# Patient Record
Sex: Female | Born: 1945 | Race: White | Hispanic: No | Smoking: Never smoker
Health system: Southern US, Community
[De-identification: ages and names within clinical notes are randomized; demographics above are authoritative.]

## PROBLEM LIST (undated history)

## (undated) DIAGNOSIS — M069 Rheumatoid arthritis, unspecified: Secondary | ICD-10-CM

## (undated) DIAGNOSIS — K5792 Diverticulitis of intestine, part unspecified, without perforation or abscess without bleeding: Secondary | ICD-10-CM

## (undated) DIAGNOSIS — F329 Major depressive disorder, single episode, unspecified: Secondary | ICD-10-CM

## (undated) DIAGNOSIS — C439 Malignant melanoma of skin, unspecified: Secondary | ICD-10-CM

## (undated) DIAGNOSIS — F419 Anxiety disorder, unspecified: Secondary | ICD-10-CM

## (undated) DIAGNOSIS — K279 Peptic ulcer, site unspecified, unspecified as acute or chronic, without hemorrhage or perforation: Secondary | ICD-10-CM

## (undated) DIAGNOSIS — F32A Depression, unspecified: Secondary | ICD-10-CM

## (undated) DIAGNOSIS — D649 Anemia, unspecified: Secondary | ICD-10-CM

## (undated) DIAGNOSIS — E039 Hypothyroidism, unspecified: Secondary | ICD-10-CM

## (undated) DIAGNOSIS — I509 Heart failure, unspecified: Secondary | ICD-10-CM

## (undated) DIAGNOSIS — Z5189 Encounter for other specified aftercare: Secondary | ICD-10-CM

## (undated) DIAGNOSIS — I10 Essential (primary) hypertension: Secondary | ICD-10-CM

## (undated) DIAGNOSIS — I350 Nonrheumatic aortic (valve) stenosis: Secondary | ICD-10-CM

## (undated) DIAGNOSIS — M1990 Unspecified osteoarthritis, unspecified site: Secondary | ICD-10-CM

## (undated) DIAGNOSIS — E785 Hyperlipidemia, unspecified: Secondary | ICD-10-CM

## (undated) DIAGNOSIS — Z952 Presence of prosthetic heart valve: Secondary | ICD-10-CM

## (undated) DIAGNOSIS — N183 Chronic kidney disease, stage 3 (moderate): Secondary | ICD-10-CM

## (undated) DIAGNOSIS — J40 Bronchitis, not specified as acute or chronic: Secondary | ICD-10-CM

## (undated) DIAGNOSIS — H3530 Unspecified macular degeneration: Secondary | ICD-10-CM

## (undated) HISTORY — DX: Unspecified macular degeneration: H35.30

## (undated) HISTORY — PX: APPENDECTOMY: SHX54

## (undated) HISTORY — PX: YAG LASER APPLICATION: SHX6189

## (undated) HISTORY — PX: BREAST SURGERY: SHX581

## (undated) HISTORY — PX: CARDIAC CATHETERIZATION: SHX172

## (undated) HISTORY — PX: CATARACT EXTRACTION: SUR2

## (undated) HISTORY — PX: OTHER SURGICAL HISTORY: SHX169

## (undated) HISTORY — DX: Malignant melanoma of skin, unspecified: C43.9

## (undated) HISTORY — PX: EYE SURGERY: SHX253

---

## 1972-12-14 HISTORY — PX: CHOLECYSTECTOMY: SHX55

## 1986-12-14 HISTORY — PX: ABDOMINAL HYSTERECTOMY: SHX81

## 1992-12-14 HISTORY — PX: FOOT SURGERY: SHX648

## 2001-05-06 ENCOUNTER — Ambulatory Visit (HOSPITAL_COMMUNITY): Admission: RE | Admit: 2001-05-06 | Discharge: 2001-05-06 | Payer: Self-pay | Admitting: *Deleted

## 2001-05-06 ENCOUNTER — Other Ambulatory Visit: Admission: RE | Admit: 2001-05-06 | Discharge: 2001-05-06 | Payer: Self-pay | Admitting: *Deleted

## 2001-05-06 ENCOUNTER — Encounter: Payer: Self-pay | Admitting: *Deleted

## 2001-07-22 ENCOUNTER — Ambulatory Visit (HOSPITAL_COMMUNITY): Admission: RE | Admit: 2001-07-22 | Discharge: 2001-07-22 | Payer: Self-pay | Admitting: Internal Medicine

## 2001-07-22 ENCOUNTER — Encounter: Payer: Self-pay | Admitting: Internal Medicine

## 2002-10-02 ENCOUNTER — Ambulatory Visit (HOSPITAL_COMMUNITY): Admission: RE | Admit: 2002-10-02 | Discharge: 2002-10-02 | Payer: Self-pay | Admitting: Family Medicine

## 2002-10-02 ENCOUNTER — Encounter: Payer: Self-pay | Admitting: Family Medicine

## 2002-10-09 ENCOUNTER — Ambulatory Visit (HOSPITAL_COMMUNITY): Admission: RE | Admit: 2002-10-09 | Discharge: 2002-10-09 | Payer: Self-pay | Admitting: Family Medicine

## 2002-10-09 ENCOUNTER — Encounter: Payer: Self-pay | Admitting: Family Medicine

## 2003-06-15 ENCOUNTER — Ambulatory Visit (HOSPITAL_COMMUNITY): Admission: RE | Admit: 2003-06-15 | Discharge: 2003-06-15 | Payer: Self-pay | Admitting: Thoracic Surgery

## 2003-06-15 ENCOUNTER — Encounter: Payer: Self-pay | Admitting: Thoracic Surgery

## 2003-06-21 ENCOUNTER — Ambulatory Visit (HOSPITAL_COMMUNITY): Admission: RE | Admit: 2003-06-21 | Discharge: 2003-06-21 | Payer: Self-pay | Admitting: Thoracic Surgery

## 2003-06-21 ENCOUNTER — Encounter: Payer: Self-pay | Admitting: Thoracic Surgery

## 2003-07-12 ENCOUNTER — Ambulatory Visit (HOSPITAL_COMMUNITY): Admission: RE | Admit: 2003-07-12 | Discharge: 2003-07-12 | Payer: Self-pay | Admitting: Thoracic Surgery

## 2003-07-12 ENCOUNTER — Encounter: Payer: Self-pay | Admitting: Thoracic Surgery

## 2004-02-06 ENCOUNTER — Ambulatory Visit (HOSPITAL_COMMUNITY): Admission: RE | Admit: 2004-02-06 | Discharge: 2004-02-06 | Payer: Self-pay | Admitting: Family Medicine

## 2004-02-21 ENCOUNTER — Ambulatory Visit (HOSPITAL_COMMUNITY): Admission: RE | Admit: 2004-02-21 | Discharge: 2004-02-21 | Payer: Self-pay | Admitting: General Surgery

## 2006-06-28 ENCOUNTER — Ambulatory Visit (HOSPITAL_COMMUNITY): Admission: RE | Admit: 2006-06-28 | Discharge: 2006-06-28 | Payer: Self-pay | Admitting: Family Medicine

## 2007-06-06 ENCOUNTER — Encounter: Payer: Self-pay | Admitting: Internal Medicine

## 2007-06-06 ENCOUNTER — Ambulatory Visit: Payer: Self-pay | Admitting: Internal Medicine

## 2007-06-06 ENCOUNTER — Ambulatory Visit (HOSPITAL_COMMUNITY): Admission: RE | Admit: 2007-06-06 | Discharge: 2007-06-06 | Payer: Self-pay | Admitting: Internal Medicine

## 2007-06-06 HISTORY — PX: COLONOSCOPY: SHX174

## 2007-10-31 ENCOUNTER — Ambulatory Visit (HOSPITAL_COMMUNITY): Admission: RE | Admit: 2007-10-31 | Discharge: 2007-10-31 | Payer: Self-pay | Admitting: Family Medicine

## 2008-04-03 ENCOUNTER — Ambulatory Visit (HOSPITAL_COMMUNITY): Admission: RE | Admit: 2008-04-03 | Discharge: 2008-04-03 | Payer: Self-pay | Admitting: Family Medicine

## 2008-07-14 HISTORY — PX: BACK SURGERY: SHX140

## 2008-08-10 ENCOUNTER — Inpatient Hospital Stay (HOSPITAL_COMMUNITY): Admission: RE | Admit: 2008-08-10 | Discharge: 2008-08-14 | Payer: Self-pay | Admitting: Neurosurgery

## 2008-12-14 HISTORY — PX: MELANOMA EXCISION: SHX5266

## 2009-11-20 ENCOUNTER — Encounter: Payer: Self-pay | Admitting: Family Medicine

## 2009-11-20 ENCOUNTER — Ambulatory Visit (HOSPITAL_COMMUNITY): Admission: RE | Admit: 2009-11-20 | Discharge: 2009-11-20 | Payer: Self-pay | Admitting: Family Medicine

## 2011-01-03 ENCOUNTER — Encounter: Payer: Self-pay | Admitting: Family Medicine

## 2011-01-04 ENCOUNTER — Encounter: Payer: Self-pay | Admitting: Internal Medicine

## 2011-04-28 NOTE — Discharge Summary (Signed)
NAME:  [name] [name], [name] [name]             ACCOUNT NO.:  [id number]   MEDICAL RECORD NO.:  [id number]          PATIENT TYPE:  INP   LOCATION:  3006                         FACILITY:  [facility]   PHYSICIAN:  Leeroy Cha, M.D.   DATE OF BIRTH:  05-27-1946   DATE OF ADMISSION:  08/10/2008  DATE OF DISCHARGE:  08/14/2008                               DISCHARGE SUMMARY   ADMISSION DIAGNOSIS:  L5-S1 grade 2 spondylolisthesis.   FINAL DIAGNOSIS:  L5-S1 grade 2 spondylolisthesis.   CLINICAL HISTORY:  The patient was admitted because of back pain  radiating to both legs.  X-ray showed grade 2 spondylolisthesis.  Surgery was advised.  The patient has a history of diabetes and thyroid  disease.  Laboratory normal.   COURSE IN THE HOSPITAL:  The patient was taken to Surgery and L5-S1  diskectomy plus fusion was done.  Today, she is much better.  She is  ambulating and she feels that she is ready to go home.   CONDITION ON DISCHARGE:  Improved.   MEDICATIONS:  Darvocet and diazepam.   DIET:  She will continue her diabetic diet.   FOLLOWUP:  She will be seen by me in my office in 4 weeks.           ______________________________  Leeroy Cha, M.D.     EB/MEDQ  D:  08/14/2008  T:  08/14/2008  Job:  [id number]

## 2011-04-28 NOTE — Op Note (Signed)
NAME:  [name] [name], BANDT             ACCOUNT NO.:  [id number]   MEDICAL RECORD NO.:  [id number]          PATIENT TYPE:  AMB   LOCATION:  DAY                           FACILITY:  [facility]   PHYSICIAN:  R. Garfield Cornea, M.D. DATE OF BIRTH:  [birth year]   DATE OF PROCEDURE:  06/06/2007  DATE OF DISCHARGE:                               OPERATIVE REPORT   PROCEDURE:  Colonoscopy with biopsy.   INDICATIONS FOR PROCEDURE:  A 65 year old Caucasian female with no lower  GI tract symptoms comes for colonoscopy referred by Dr. Caron Presume.  The  family history positive for colon cancer in her brother.  Her last  colonoscopy was in 1999; and she was found to have some internal  hemorrhoids and left-sided diverticula.  Colonoscopy is now being done,  largely, as a screening maneuver.  This approach has discussed with the  patient at length.  Potential risks, benefits, and alternatives have  been reviewed, questions answered; she is agreeable.  Please see the  documentation in the medical record.   PROCEDURE NOTE:  O2 saturation, blood pressure, pulse, and respirations  monitored the entire procedure.   CONSCIOUS SEDATION:  Demerol 50 mg IV, Versed 3 mg IV in divided doses.   INSTRUMENT:  Pentax video chip system.   FINDINGS:  Digital rectal exam revealed no abnormalities.   ENDOSCOPIC FINDINGS:  The prep was adequate.   COLON:  The colonic mucosa was surveyed from the rectosigmoid junction  through the left transverse, right colon, to the appendiceal orifice,  ileocecal valve, and cecum.  These structures were well seen and  photographed for the record.  From this level the scope was slowly  withdrawn.  All previous mentioned mucosal surfaces were, again, seen.  The patient had extensive left-sided diverticula.  The remainder of the  colonic mucosa appeared normal.  The scope was pulled down to the rectum  for a thorough examination of the rectal mucosa including retroflex view  of the anal verge.   This demonstrated a cluster of 1-2 mm diminutive  polyps in the proximal rectum  They were cold biopsied/removed.  The  remainder of the rectal mucosa appeared normal.  The patient tolerated  the procedure well and was reacted in endoscopy.   IMPRESSION:  1. Proximal diminutive rectal polyps cold biopsied/removed, otherwise      normal rectum.  2. Left-sided diverticula, the remainder of the colonic mucosa      appeared normal.   RECOMMENDATIONS:  1. Follow-up on path.  2. Diverticulosis literature given to Ms. Zayas.  3. The patient should consider taking a daily fiber for supplement      such as Metamucil.  Further recommendations to follow.      Bridgette Habermann, M.D.  Electronically Signed     RMR/MEDQ  D:  06/06/2007  T:  06/06/2007  Job:  [id number]   cc:   Bonne Dolores, M.D.  Fax: [phone]

## 2011-04-28 NOTE — H&P (Signed)
NAME:  [name] [name], [name] [name] NO.:  [id number]   MEDICAL RECORD NO.:  [id number]          PATIENT TYPE:  INP   LOCATION:  3006                         FACILITY:  [facility]   PHYSICIAN:  Leeroy Cha, M.D.   DATE OF BIRTH:  05-07-1946   DATE OF ADMISSION:  08/10/2008  DATE OF DISCHARGE:                              HISTORY & PHYSICAL   CLINICAL HISTORY:  [name] [name] is a lady who had been seen in my office  with her husband several occasions because of back pain radiating down  to both legs.  The pain was beginning in the left leg and later on in  the right leg.  She also complains of numbness.  The patient has had  conservative treatment and she is not any better.   PAST MEDICAL HISTORY:  1. Hysterectomy.  2. Cholecystectomy.  3. She has a history of thyroid disease as well as diabetes.   FAMILY HISTORY:  Mother died of congestive heart failure.  Father died  of MI.   SOCIAL HISTORY:  She drinks socially, do smoke.   REVIEW OF SYSTEMS:  Positive for back pain, high blood pressure, and  arthritis.   PHYSICAL EXAMINATION:  HEAD, EARS, NOSE, AND THROAT:  Normal.  NECK:  Normal.  LUNGS:  Clear.  HEART:  Sounds are normal.  ABDOMEN:  Normal.  EXTREMITIES:  Normal pulses.  NEURO:  She has some weakness dorsiflexion of both legs, especially at  the level of the right foot.  Sensory normal, although she complains of  numbness in both feet.  Reflexes 1+.  No Babinski.  Straight leg raising  is positive bilaterally about 80 degrees.   Lumbar spine and chest x-ray as well as MRI showed that she has a grade  2 spondylolisthesis at the level of L5-S1.   CLINICAL IMPRESSION:  L5-S1 grade 2 spondylolisthesis.   RECOMMENDATIONS:  The patient is being admitted for surgery.  The  procedure will be L5-S1 diskectomy and interbody fusion with autograft  and cages and pedicle screws.  The patient knows about the risks of  infection, CSF leak, worsening pain, paralysis, no  improvement  whatsoever, and failure of the hardware.  Also, all the risks associated  with her obesity and diabetes.          ______________________________  Leeroy Cha, M.D.    EB/MEDQ  D:  08/10/2008  T:  08/11/2008  Job:  [id number]

## 2011-04-28 NOTE — Op Note (Signed)
NAME:  [name] [name], [name] [name]             ACCOUNT NO.:  [id number]   MEDICAL RECORD NO.:  [id number]          PATIENT TYPE:  INP   LOCATION:  3006                         FACILITY:  [facility]   PHYSICIAN:  Leeroy Cha, M.D.   DATE OF BIRTH:  06-20-1946   DATE OF PROCEDURE:  08/10/2008  DATE OF DISCHARGE:                               OPERATIVE REPORT   PREOPERATIVE DIAGNOSES:  L5-S1 grade I and grade II spondylolisthesis  with chronic radiculopathy.   POSTOPERATIVE DIAGNOSES:  L5-S1 grade I grade II spondylolisthesis with  chronic radiculopathy.   PROCEDURE:  1. L5 Gill procedure.  2. Bilateral L5-S1 diskectomy.  3. Interbody fusion with cage 8 x 22.  4. Pedicle screws L5-S1.  5. Posterolateral arthrodesis.  6. Cell saver and C-arm.   SURGEON:  Leeroy Cha, MD   ASSISTANT:  Otilio Connors, MD   CLINICAL HISTORY:  [name] is a 65 years old diabetic who had been  complaining of back pain, worsen to both legs for many years.  She had  failed with conservative treatment.  X-rays show grade I and grade II  spondylolisthesis.  Surgery was advised.  The risks were explained to  her.   PROCEDURE:  The patient was taken to the OR and after the intubation,  she was positioned in a prone manner.  The skin was cleaned with  DuraPrep.  A midline incision was made and muscles were retracted  laterally.  We entered the area of the lumbar spine and x-rays showed  that indeed we were at the level of L5-S1.  The L5 facet was loose.  We  proceeded with a Gill procedure, removing the spinous process of L5  lying down the facet.  The patient has quite a bit of thickening of the  ligament with calcification compromising the L5 nerve root bilaterally.  Lysis was accomplished.  We entered the disk space and we proceeded with  a total diskectomy.  At the end, we were able to introduce 2 cages of 8  x 22 with autograft and BMP.  Then, with the C-arm using the AP and  lateral view, we probed the  pedicle of L5-S1.  We introduced 4 screws of  5.5 x 4.  They were connected with rod and caps.  We went laterally and  we did remove the periosteum of the lateral aspect of the facet and the  ala of the sacrum and a mix of BMP and autograft was used for  arthrodesis.  At the end, we had a good decompression.  Valsalva  maneuver was negative.  Then, the wound was closed with Vicryl and with  Steri-Strip.  The patient is going to go to the recovery room.           ______________________________  Leeroy Cha, M.D.    EB/MEDQ  D:  08/10/2008  T:  08/11/2008  Job:  [id number]

## 2011-05-01 NOTE — H&P (Signed)
NAME:  [name] [name], [name] [name]                       ACCOUNT NO.:  [id number]   MEDICAL RECORD NO.:  [id number]                   PATIENT TYPE:  OUT   LOCATION:  RAD                                  FACILITY:  [facility]   PHYSICIAN:  Jamesetta So, M.D.               DATE OF BIRTH:  1946/07/11   DATE OF ADMISSION:  02/06/2004  DATE OF DISCHARGE:  02/06/2004                                HISTORY & PHYSICAL   CHIEF COMPLAINT:  1. Family history of colon carcinoma.  2. History of diverticulitis.   HISTORY OF PRESENT ILLNESS:  The patient is a 65 year old white female who  presents for a colonoscopy.  It has been many years since she has had a  colonoscopy.  She does have a history of diverticulitis and immediate family  history of colon carcinoma.  She denies any recent abdominal pain, fever,  chills, hematochezia or melena.   PAST MEDICAL HISTORY:  1. Non-insulin-dependent diabetes mellitus.  2. Extrinsic allergies.  3. Hypertension.  4. Hypothyroidism.   PAST SURGICAL HISTORY:  1. Cholecystectomy.  2. Hysterectomy.  3. Left foot surgery.   MEDICATIONS:  1. Premarin.  2. Glipizide.  3. Allegra.  4. Synthroid.  5. Enalapril/Hydrochlorothiazide.   ALLERGIES:  No known drug allergies.   REVIEW OF SYSTEMS:  Noncontributory.   PHYSICAL EXAMINATION:  GENERAL APPEARANCE:  The patient is a well-developed,  well-nourished white female in no acute distress.  VITAL SIGNS:  Afebrile, stable.  LUNGS:  Clear to auscultation with good breath sounds bilaterally.  HEART:  Regular rate and rhythm without S3, S4 or murmurs.  ABDOMEN:  Soft, nontender and nondistended.  No hepatosplenomegaly or masses  noted.  RECTAL:  Deferred to the procedure.   IMPRESSION:  Family history of colon carcinoma, history of diverticulitis.   PLAN:  The patient is scheduled for a colonoscopy on February 21, 2004.  The  risks and benefits of the procedure including bleeding and perforation were  fully explained to  the patient gaining informed consent.     ___________________________________________                                         Jamesetta So, M.D.   MAJ/MEDQ  D:  02/12/2004  T:  02/12/2004  Job:  [id number]   cc:   Bonne Dolores, M.D.  [address] [place]  [place] [address]  Fax: [phone]

## 2011-08-01 ENCOUNTER — Emergency Department (HOSPITAL_COMMUNITY): Payer: Medicare Other

## 2011-08-01 ENCOUNTER — Emergency Department (HOSPITAL_COMMUNITY)
Admission: EM | Admit: 2011-08-01 | Discharge: 2011-08-01 | Disposition: A | Discharge status: Home / Self Care | Payer: Medicare Other | Attending: Emergency Medicine | Admitting: Emergency Medicine

## 2011-08-01 ENCOUNTER — Encounter: Payer: Self-pay | Admitting: *Deleted

## 2011-08-01 DIAGNOSIS — E119 Type 2 diabetes mellitus without complications: Secondary | ICD-10-CM | POA: Insufficient documentation

## 2011-08-01 DIAGNOSIS — M545 Low back pain, unspecified: Secondary | ICD-10-CM | POA: Insufficient documentation

## 2011-08-01 DIAGNOSIS — M5430 Sciatica, unspecified side: Secondary | ICD-10-CM

## 2011-08-01 DIAGNOSIS — S335XXA Sprain of ligaments of lumbar spine, initial encounter: Secondary | ICD-10-CM

## 2011-08-01 DIAGNOSIS — Z7982 Long term (current) use of aspirin: Secondary | ICD-10-CM | POA: Insufficient documentation

## 2011-08-01 DIAGNOSIS — M25559 Pain in unspecified hip: Secondary | ICD-10-CM | POA: Insufficient documentation

## 2011-08-01 DIAGNOSIS — I10 Essential (primary) hypertension: Secondary | ICD-10-CM | POA: Insufficient documentation

## 2011-08-01 DIAGNOSIS — Z79899 Other long term (current) drug therapy: Secondary | ICD-10-CM | POA: Insufficient documentation

## 2011-08-01 HISTORY — DX: Unspecified osteoarthritis, unspecified site: M19.90

## 2011-08-01 HISTORY — DX: Essential (primary) hypertension: I10

## 2011-08-01 MED ORDER — HYDROCODONE-ACETAMINOPHEN 5-325 MG PO TABS: 1.0000 | ORAL_TABLET | ORAL | Status: AC | PRN

## 2011-08-01 MED ORDER — HYDROCODONE-ACETAMINOPHEN 5-325 MG PO TABS
1.0000 | ORAL_TABLET | Freq: Once | ORAL | Status: AC
  Administered 2011-08-01: 1 via ORAL
  Filled 2011-08-01: qty 1

## 2011-08-01 NOTE — ED Provider Notes (Signed)
Medical screening examination/treatment/procedure(s) were performed by non-physician practitioner and as supervising physician I was immediately available for consultation/collaboration.   Jasper Riling. Alvino Chapel, MD 08/01/11 1600

## 2011-08-01 NOTE — ED Notes (Addendum)
Pt updated on delay in care, states that she is feeling better after the pain medication

## 2011-08-01 NOTE — ED Provider Notes (Signed)
History     CSN: [id number] Arrival date & time: 08/01/2011 10:48 AM  Chief Complaint  Patient presents with  . Back Pain   Patient is a 65 y.o. female presenting with back pain. The history is provided by the patient.  Back Pain  This is a recurrent problem. The current episode started more than 2 days ago. The problem occurs constantly. The problem has not changed (She fell a week ago,  slipping on a wet floor,  landing on her lower back.  She has persistent pain with worsened right sciatica.) since onset.The pain is associated with falling. The pain is present in the lumbar spine and gluteal region. The quality of the pain is described as aching. The pain radiates to the right thigh. The pain is at a severity of 5/10. The pain is moderate. The symptoms are aggravated by certain positions. The pain is worse during the day. Pertinent negatives include no chest pain, no fever, no numbness, no headaches, no abdominal pain, no bowel incontinence, no perianal numbness, no bladder incontinence, no dysuria, no paresthesias, no paresis, no tingling and no weakness. She has tried NSAIDs for the symptoms. The treatment provided mild relief.    Past Medical History  Diagnosis Date  . Diabetes mellitus   . Hypertension   . Thyroid disease   . Arthritis     Past Surgical History  Procedure Date  . Foot surgery     left   . Abdominal hysterectomy   . Cholecystectomy   . Back surgery     History reviewed. No pertinent family history.  History  Substance Use Topics  . Smoking status: Never Smoker   . Smokeless tobacco: Not on file  . Alcohol Use: No    OB History    Grav Para Term Preterm Abortions TAB SAB Ect Mult Living                  Review of Systems  Constitutional: Negative for fever.  HENT: Negative for congestion, sore throat and neck pain.   Eyes: Negative.   Respiratory: Negative for chest tightness and shortness of breath.   Cardiovascular: Negative for chest pain.    Gastrointestinal: Negative for nausea, abdominal pain and bowel incontinence.  Genitourinary: Negative.  Negative for bladder incontinence and dysuria.  Musculoskeletal: Positive for back pain. Negative for joint swelling and arthralgias.  Skin: Negative.  Negative for rash and wound.  Neurological: Negative for dizziness, tingling, weakness, light-headedness, numbness, headaches and paresthesias.  Hematological: Negative.   Psychiatric/Behavioral: Negative.     Physical Exam  BP 188/81  Pulse 70  Temp(Src) 97.6 F (36.4 C) (Oral)  Resp 16  Ht 4\' 9"  (1.448 m)  Wt 143 lb (64.864 kg)  BMI 30.94 kg/m2  SpO2 100%  Physical Exam  Constitutional: She is oriented to person, place, and time. She appears well-developed and well-nourished.  HENT:  Head: Normocephalic.  Eyes: Conjunctivae are normal.  Neck: Normal range of motion. Neck supple.  Cardiovascular: Regular rhythm and intact distal pulses.        Pedal pulses normal.  Pulmonary/Chest: Effort normal. She has no wheezes.  Abdominal: Soft. Bowel sounds are normal. She exhibits no distension and no mass.  Musculoskeletal: Normal range of motion. She exhibits no edema.       Lumbar back: She exhibits tenderness. She exhibits no swelling, no edema and no spasm.  Neurological: She is alert and oriented to person, place, and time. She has normal strength. She displays  no atrophy and no tremor. No cranial nerve deficit or sensory deficit. Gait normal. GCS eye subscore is 4. GCS verbal subscore is 5. GCS motor subscore is 6.  Reflex Scores:      Patellar reflexes are 2+ on the right side and 2+ on the left side.      Achilles reflexes are 2+ on the right side and 2+ on the left side.      No strength deficit noted in hip and knee flexor and extensor muscle groups.  Ankle flexion and extension intact.  Skin: Skin is warm and dry.  Psychiatric: She has a normal mood and affect.    ED Course  Procedures  MDM Patients labs and/or  radiological studies were reviewed during the medical decision making and disposition process.   No results found for this or any previous visit. Dg Lumbar Spine Complete  08/01/2011  *RADIOLOGY REPORT*  Clinical Data: Fall.  Low back injury and pain.  Previous lumbar spine fusion.  LUMBAR SPINE - COMPLETE 4+ VIEW  Comparison: None.  Findings: Posterior lumbar spine fusion seen at L5-S1.  Mild degenerative disc disease seen at L4-5.  Mild degenerative osteophyte formation seen at other lumbar levels, although disc spaces are maintained.  Bilateral facet DJD also noted at L3-4 and L4-5.  No evidence of acute lumbar spine fracture, spondylolysis, or spondylolisthesis.  IMPRESSION:  1.  No acute findings. 2.  Mild lower lumbar degenerative spondylosis and previous fusion at L5-S1.  Original Report Authenticated By: Marlaine Hind, M.D.   Dg Pelvis 1-2 Views  08/01/2011  *RADIOLOGY REPORT*  Clinical Data: Right hip pain after a fall 6 days ago  PELVIS - 1-2 VIEW  Comparison: 08/01/2011 lumbar spine radiograph, 08/10/2008 intraoperative radiograph of the lumbar spine  Findings: Lumbar fusion hardware is partly visualized.  No displaced pelvic fracture is seen.  SI joints are within normal limits.  Normal visualized bowel gas pattern. Fine detail is obscured by patient body habitus.  IMPRESSION: No displaced pelvic fracture identified.  Original Report Authenticated By: Arline Asp, M.D.           Fulton Reek, PA 08/01/11 1335

## 2011-08-01 NOTE — ED Notes (Signed)
Pt states that she fell last Sunday and landed on her back. C/o pain radiating down her rt hip and leg. Pt states that it is gradually getting worse.

## 2011-09-16 LAB — GLUCOSE, CAPILLARY: Glucose-Capillary: 235 — ABNORMAL HIGH

## 2011-12-10 ENCOUNTER — Encounter (HOSPITAL_COMMUNITY): Payer: Self-pay

## 2011-12-10 ENCOUNTER — Emergency Department (HOSPITAL_COMMUNITY): Payer: Medicare Other

## 2011-12-10 ENCOUNTER — Emergency Department (HOSPITAL_COMMUNITY)
Admission: EM | Admit: 2011-12-10 | Discharge: 2011-12-10 | Disposition: A | Discharge status: Home / Self Care | Payer: Medicare Other | Attending: Emergency Medicine | Admitting: Emergency Medicine

## 2011-12-10 DIAGNOSIS — M25559 Pain in unspecified hip: Secondary | ICD-10-CM | POA: Insufficient documentation

## 2011-12-10 DIAGNOSIS — E119 Type 2 diabetes mellitus without complications: Secondary | ICD-10-CM | POA: Insufficient documentation

## 2011-12-10 DIAGNOSIS — M129 Arthropathy, unspecified: Secondary | ICD-10-CM | POA: Insufficient documentation

## 2011-12-10 DIAGNOSIS — Z9079 Acquired absence of other genital organ(s): Secondary | ICD-10-CM | POA: Insufficient documentation

## 2011-12-10 DIAGNOSIS — Z981 Arthrodesis status: Secondary | ICD-10-CM | POA: Insufficient documentation

## 2011-12-10 DIAGNOSIS — Z79899 Other long term (current) drug therapy: Secondary | ICD-10-CM | POA: Insufficient documentation

## 2011-12-10 DIAGNOSIS — I10 Essential (primary) hypertension: Secondary | ICD-10-CM | POA: Insufficient documentation

## 2011-12-10 DIAGNOSIS — E079 Disorder of thyroid, unspecified: Secondary | ICD-10-CM | POA: Insufficient documentation

## 2011-12-10 MED ORDER — HYDROCODONE-ACETAMINOPHEN 5-325 MG PO TABS
2.0000 | ORAL_TABLET | Freq: Once | ORAL | Status: AC
  Administered 2011-12-10: 2 via ORAL
  Filled 2011-12-10: qty 2

## 2011-12-10 NOTE — ED Notes (Signed)
Pt states chronic pain has progressively gotten worse. 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 Pt c/o rt hip pain that radiates down leg to her knee.  Pt reports unable to sleep comfortably with her worsening pain.

## 2011-12-10 NOTE — ED Provider Notes (Signed)
Scribed for [employer]. [name] Hauser, MD, the patient was seen in room APA08/APA08 . This chart was scribed by [name] [name].   CSN: [id number]  Arrival date & time 12/10/11  1235   First MD Initiated Contact with Patient 12/10/11 1402      Chief Complaint  Patient presents with  . Hip Pain    (Consider location/radiation/quality/duration/timing/severity/associated sxs/prior treatment) HPI [name] [name] is a 65 y.o. female who presents to the Emergency Department complaining of right hip pain. Pain began in July 2012 and has been gradually worsening. Pt describes pain as severe. Pt reports several falls on her right side over the past year, but denies any specific precipitating injury or trauma to her right hip. Pain is worse with walking and any other weight bearing activities. Pt has treated pain with Vicodin (Rx'd by PCP [name] [name]) with improvement. Pt has seen Dr. Gerarda [name] for pain, and was referred to pain specialist who referred her to a chiropractor. Pt reports improvement in back pain with chiropractic treatments but no improvement in hip pain.  Past Medical History  Diagnosis Date  . Diabetes mellitus   . Hypertension   . Thyroid disease   . Arthritis     Past Surgical History  Procedure Date  . Foot surgery     left   . Abdominal hysterectomy   . Cholecystectomy   . Back surgery     No family history on file.  History  Substance Use Topics  . Smoking status: Never Smoker   . Smokeless tobacco: Not on file  . Alcohol Use: No    Review of Systems 10 Systems reviewed and are negative for acute change except as noted in the HPI.   Allergies  Review of patient's allergies indicates no known allergies.  Home Medications   Current Outpatient Rx  Name Route Sig Dispense Refill  . ACETAMINOPHEN ER 650 MG PO TBCR Oral Take 650 mg by mouth every 4 (four) hours as needed. For pain     . AMLODIPINE BESYLATE 10 MG PO TABS Oral Take 10 mg by mouth daily.      Marland Kitchen VITAMIN C PO  Oral Take 1 tablet by mouth daily.      . ASPIRIN EC 81 MG PO TBEC Oral Take 81 mg by mouth daily.      . B COMPLEX-C PO TABS Oral Take 1 tablet by mouth daily.      Marland Kitchen CALCIUM PO Oral Take 1 tablet by mouth every evening.      . ENALAPRIL MALEATE 20 MG PO TABS Oral Take 20 mg by mouth daily.      Marland Kitchen GABAPENTIN 100 MG PO CAPS Oral Take 100 mg by mouth 3 (three) times daily.      Marland Kitchen GLIPIZIDE ER 5 MG PO TB24 Oral Take 5 mg by mouth daily.      Marland Kitchen GLUCOSAMINE-CHONDROITIN 500-400 MG PO TABS Oral Take 1 tablet by mouth daily.      Marland Kitchen HYDROCHLOROTHIAZIDE 25 MG PO TABS Oral Take 12.5 mg by mouth daily. Take 1/2 tablet daily     . HYDROCODONE-ACETAMINOPHEN 5-500 MG PO TABS Oral Take 1 tablet by mouth every 6 (six) hours as needed. For pain     . LATANOPROST 0.005 % OP SOLN Both Eyes Place 1 drop into both eyes at bedtime.      Marland Kitchen LEVOTHYROXINE SODIUM 88 MCG PO TABS Oral Take 88 mcg by mouth daily.      . MELOXICAM 15 MG PO  TABS Oral Take 15 mg by mouth daily.      Marland Kitchen METFORMIN HCL 500 MG PO TABS Oral Take 500 mg by mouth 2 (two) times daily with a meal.      . ADULT MULTIVITAMIN W/MINERALS CH Oral Take 1 tablet by mouth daily.      . OMEGA-3-ACID ETHYL ESTERS 1 G PO CAPS Oral Take 2 g by mouth daily.      Marland Kitchen POTASSIUM CHLORIDE CRYS CR 20 MEQ PO TBCR Oral Take 20 mEq by mouth daily. Take 1/2 tablet daily    . SIMVASTATIN 20 MG PO TABS Oral Take 20 mg by mouth at bedtime.      Marland Kitchen TIMOLOL MALEATE 0.5 % OP SOLN Both Eyes Place 1 drop into both eyes every morning.      Marland Kitchen VITAMIN D (CHOLECALCIFEROL) PO Oral Take 1 tablet by mouth daily.      Marland Kitchen VITAMIN E PO Oral Take 1 tablet by mouth daily.        BP 138/73  Pulse 72  Temp(Src) 97.9 F (36.6 C) (Oral)  Resp 20  Ht 4\' 9"  (1.448 m)  Wt 134 lb (60.782 kg)  BMI 29.00 kg/m2  SpO2 100%  Physical Exam  Nursing note and vitals reviewed. Constitutional: She is oriented to person, place, and time. She appears well-developed and well-nourished.  HENT:  Head:  Normocephalic and atraumatic.  Right Ear: External ear normal.  Left Ear: External ear normal.  Eyes: EOM are normal. Pupils are equal, round, and reactive to light.  Neck: Neck supple.  Cardiovascular: Normal rate, regular rhythm and normal heart sounds.   Pulmonary/Chest: Effort normal. No respiratory distress.  Abdominal: Soft. There is no tenderness.  Musculoskeletal:       No pain with palpation over external right hip. Full ROM right hip. No pain with internal or external rotation of hip.   Neurological: She is alert and oriented to person, place, and time.  Skin: Skin is warm and dry.    ED Course  Procedures (including critical care time) DIAGNOSTIC STUDIES: Oxygen Saturation is 100% on room air, normal by my interpretation.   Mr Hip Right Wo Contrast  12/10/2011  *RADIOLOGY REPORT*  Clinical Data: Right hip pain.  Previous lumbar fusion at L5-S1.  MRI OF THE RIGHT HIP WITHOUT CONTRAST  Technique:  Multiplanar, multisequence MR imaging was performed. No intravenous contrast was administered.  Comparison: Radiograph dated 08/01/2011  Findings: The osseous structures of the right hip are normal. There is no hip effusion.  No bursitis.  No appreciable arthritic changes.  Muscle structures of the hips are bilaterally symmetrical and normal.  Uterus has been removed.  The patient has a horseshoe kidney.  IMPRESSION: Normal right hip.  Horseshoe kidney.  Original Report Authenticated By: Larey Seat, M.D.   COORDINATION OF CARE:     MDM  Patient with right hip pain for 6 months that is worse with walking and some positions. She has been seen by chiropractor with no relief. When pain began 6 months ago had plain film which was negative. MRI pending. Given analgesics with relief.  I personally performed the services described in this documentation, which was scribed in my presence. The recorded information has been reviewed and considered.  MDM Reviewed: nursing note and  vitals Interpretation: MRI         [name] [name]. [name] Hauser, MD 12/10/11 [phone]

## 2011-12-10 NOTE — ED Notes (Signed)
Pt c/o right hip pain since before July.  Says has been going to chiropractor and it has been helping for a while but lately pain has gotten worse.  Denies injury.

## 2011-12-19 DIAGNOSIS — M545 Low back pain, unspecified: Secondary | ICD-10-CM | POA: Diagnosis not present

## 2011-12-19 DIAGNOSIS — Z6830 Body mass index (BMI) 30.0-30.9, adult: Secondary | ICD-10-CM | POA: Diagnosis not present

## 2011-12-22 DIAGNOSIS — M47817 Spondylosis without myelopathy or radiculopathy, lumbosacral region: Secondary | ICD-10-CM | POA: Diagnosis not present

## 2011-12-22 DIAGNOSIS — M999 Biomechanical lesion, unspecified: Secondary | ICD-10-CM | POA: Diagnosis not present

## 2011-12-22 DIAGNOSIS — M5430 Sciatica, unspecified side: Secondary | ICD-10-CM | POA: Diagnosis not present

## 2011-12-24 DIAGNOSIS — M47817 Spondylosis without myelopathy or radiculopathy, lumbosacral region: Secondary | ICD-10-CM | POA: Diagnosis not present

## 2011-12-24 DIAGNOSIS — M999 Biomechanical lesion, unspecified: Secondary | ICD-10-CM | POA: Diagnosis not present

## 2011-12-28 DIAGNOSIS — M47817 Spondylosis without myelopathy or radiculopathy, lumbosacral region: Secondary | ICD-10-CM | POA: Diagnosis not present

## 2011-12-28 DIAGNOSIS — M5430 Sciatica, unspecified side: Secondary | ICD-10-CM | POA: Diagnosis not present

## 2011-12-28 DIAGNOSIS — M999 Biomechanical lesion, unspecified: Secondary | ICD-10-CM | POA: Diagnosis not present

## 2011-12-29 DIAGNOSIS — M25559 Pain in unspecified hip: Secondary | ICD-10-CM | POA: Diagnosis not present

## 2011-12-29 DIAGNOSIS — M545 Low back pain, unspecified: Secondary | ICD-10-CM | POA: Diagnosis not present

## 2011-12-30 DIAGNOSIS — M47817 Spondylosis without myelopathy or radiculopathy, lumbosacral region: Secondary | ICD-10-CM | POA: Diagnosis not present

## 2011-12-30 DIAGNOSIS — M5430 Sciatica, unspecified side: Secondary | ICD-10-CM | POA: Diagnosis not present

## 2011-12-30 DIAGNOSIS — M999 Biomechanical lesion, unspecified: Secondary | ICD-10-CM | POA: Diagnosis not present

## 2012-01-04 DIAGNOSIS — M5430 Sciatica, unspecified side: Secondary | ICD-10-CM | POA: Diagnosis not present

## 2012-01-04 DIAGNOSIS — M999 Biomechanical lesion, unspecified: Secondary | ICD-10-CM | POA: Diagnosis not present

## 2012-01-04 DIAGNOSIS — M47817 Spondylosis without myelopathy or radiculopathy, lumbosacral region: Secondary | ICD-10-CM | POA: Diagnosis not present

## 2012-01-07 DIAGNOSIS — M47817 Spondylosis without myelopathy or radiculopathy, lumbosacral region: Secondary | ICD-10-CM | POA: Diagnosis not present

## 2012-01-07 DIAGNOSIS — M999 Biomechanical lesion, unspecified: Secondary | ICD-10-CM | POA: Diagnosis not present

## 2012-01-07 DIAGNOSIS — M5430 Sciatica, unspecified side: Secondary | ICD-10-CM | POA: Diagnosis not present

## 2012-01-11 DIAGNOSIS — M5430 Sciatica, unspecified side: Secondary | ICD-10-CM | POA: Diagnosis not present

## 2012-01-11 DIAGNOSIS — M999 Biomechanical lesion, unspecified: Secondary | ICD-10-CM | POA: Diagnosis not present

## 2012-01-11 DIAGNOSIS — M47817 Spondylosis without myelopathy or radiculopathy, lumbosacral region: Secondary | ICD-10-CM | POA: Diagnosis not present

## 2012-01-13 DIAGNOSIS — M999 Biomechanical lesion, unspecified: Secondary | ICD-10-CM | POA: Diagnosis not present

## 2012-01-13 DIAGNOSIS — M76899 Other specified enthesopathies of unspecified lower limb, excluding foot: Secondary | ICD-10-CM | POA: Diagnosis not present

## 2012-01-13 DIAGNOSIS — M47817 Spondylosis without myelopathy or radiculopathy, lumbosacral region: Secondary | ICD-10-CM | POA: Diagnosis not present

## 2012-01-17 DIAGNOSIS — M999 Biomechanical lesion, unspecified: Secondary | ICD-10-CM | POA: Diagnosis not present

## 2012-01-17 DIAGNOSIS — M47817 Spondylosis without myelopathy or radiculopathy, lumbosacral region: Secondary | ICD-10-CM | POA: Diagnosis not present

## 2012-01-27 DIAGNOSIS — M999 Biomechanical lesion, unspecified: Secondary | ICD-10-CM | POA: Diagnosis not present

## 2012-01-27 DIAGNOSIS — M47817 Spondylosis without myelopathy or radiculopathy, lumbosacral region: Secondary | ICD-10-CM | POA: Diagnosis not present

## 2012-01-29 DIAGNOSIS — M47817 Spondylosis without myelopathy or radiculopathy, lumbosacral region: Secondary | ICD-10-CM | POA: Diagnosis not present

## 2012-01-29 DIAGNOSIS — M999 Biomechanical lesion, unspecified: Secondary | ICD-10-CM | POA: Diagnosis not present

## 2012-02-02 DIAGNOSIS — M999 Biomechanical lesion, unspecified: Secondary | ICD-10-CM | POA: Diagnosis not present

## 2012-02-02 DIAGNOSIS — M47817 Spondylosis without myelopathy or radiculopathy, lumbosacral region: Secondary | ICD-10-CM | POA: Diagnosis not present

## 2012-02-02 DIAGNOSIS — R262 Difficulty in walking, not elsewhere classified: Secondary | ICD-10-CM | POA: Diagnosis not present

## 2012-02-02 DIAGNOSIS — M6281 Muscle weakness (generalized): Secondary | ICD-10-CM | POA: Diagnosis not present

## 2012-02-05 DIAGNOSIS — M6281 Muscle weakness (generalized): Secondary | ICD-10-CM | POA: Diagnosis not present

## 2012-02-05 DIAGNOSIS — R262 Difficulty in walking, not elsewhere classified: Secondary | ICD-10-CM | POA: Diagnosis not present

## 2012-02-09 DIAGNOSIS — M6281 Muscle weakness (generalized): Secondary | ICD-10-CM | POA: Diagnosis not present

## 2012-02-09 DIAGNOSIS — M5430 Sciatica, unspecified side: Secondary | ICD-10-CM | POA: Diagnosis not present

## 2012-02-09 DIAGNOSIS — R262 Difficulty in walking, not elsewhere classified: Secondary | ICD-10-CM | POA: Diagnosis not present

## 2012-02-09 DIAGNOSIS — M999 Biomechanical lesion, unspecified: Secondary | ICD-10-CM | POA: Diagnosis not present

## 2012-02-09 DIAGNOSIS — IMO0002 Reserved for concepts with insufficient information to code with codable children: Secondary | ICD-10-CM | POA: Diagnosis not present

## 2012-02-09 DIAGNOSIS — M47817 Spondylosis without myelopathy or radiculopathy, lumbosacral region: Secondary | ICD-10-CM | POA: Diagnosis not present

## 2012-02-12 DIAGNOSIS — R262 Difficulty in walking, not elsewhere classified: Secondary | ICD-10-CM | POA: Diagnosis not present

## 2012-02-12 DIAGNOSIS — M6281 Muscle weakness (generalized): Secondary | ICD-10-CM | POA: Diagnosis not present

## 2012-02-18 DIAGNOSIS — M999 Biomechanical lesion, unspecified: Secondary | ICD-10-CM | POA: Diagnosis not present

## 2012-02-18 DIAGNOSIS — R262 Difficulty in walking, not elsewhere classified: Secondary | ICD-10-CM | POA: Diagnosis not present

## 2012-02-18 DIAGNOSIS — M5430 Sciatica, unspecified side: Secondary | ICD-10-CM | POA: Diagnosis not present

## 2012-02-18 DIAGNOSIS — S335XXA Sprain of ligaments of lumbar spine, initial encounter: Secondary | ICD-10-CM | POA: Diagnosis not present

## 2012-02-18 DIAGNOSIS — M6281 Muscle weakness (generalized): Secondary | ICD-10-CM | POA: Diagnosis not present

## 2012-02-23 DIAGNOSIS — M6281 Muscle weakness (generalized): Secondary | ICD-10-CM | POA: Diagnosis not present

## 2012-02-23 DIAGNOSIS — R262 Difficulty in walking, not elsewhere classified: Secondary | ICD-10-CM | POA: Diagnosis not present

## 2012-02-24 DIAGNOSIS — M545 Low back pain, unspecified: Secondary | ICD-10-CM | POA: Diagnosis not present

## 2012-02-25 ENCOUNTER — Other Ambulatory Visit (HOSPITAL_COMMUNITY): Payer: Self-pay | Admitting: Anesthesiology

## 2012-02-25 DIAGNOSIS — M545 Low back pain, unspecified: Secondary | ICD-10-CM

## 2012-02-26 DIAGNOSIS — K5732 Diverticulitis of large intestine without perforation or abscess without bleeding: Secondary | ICD-10-CM | POA: Diagnosis not present

## 2012-02-26 DIAGNOSIS — Z6829 Body mass index (BMI) 29.0-29.9, adult: Secondary | ICD-10-CM | POA: Diagnosis not present

## 2012-02-26 DIAGNOSIS — R109 Unspecified abdominal pain: Secondary | ICD-10-CM | POA: Diagnosis not present

## 2012-03-01 ENCOUNTER — Ambulatory Visit (HOSPITAL_COMMUNITY)
Admission: RE | Admit: 2012-03-01 | Discharge: 2012-03-01 | Disposition: A | Discharge status: Home / Self Care | Payer: Medicare Other | Source: Ambulatory Visit | Attending: Anesthesiology | Admitting: Anesthesiology

## 2012-03-01 DIAGNOSIS — M545 Low back pain, unspecified: Secondary | ICD-10-CM

## 2012-03-01 DIAGNOSIS — M5137 Other intervertebral disc degeneration, lumbosacral region: Secondary | ICD-10-CM | POA: Insufficient documentation

## 2012-03-01 DIAGNOSIS — Z981 Arthrodesis status: Secondary | ICD-10-CM | POA: Insufficient documentation

## 2012-03-01 DIAGNOSIS — M5126 Other intervertebral disc displacement, lumbar region: Secondary | ICD-10-CM | POA: Diagnosis not present

## 2012-03-01 DIAGNOSIS — M51379 Other intervertebral disc degeneration, lumbosacral region without mention of lumbar back pain or lower extremity pain: Secondary | ICD-10-CM | POA: Insufficient documentation

## 2012-03-03 DIAGNOSIS — R262 Difficulty in walking, not elsewhere classified: Secondary | ICD-10-CM | POA: Diagnosis not present

## 2012-03-03 DIAGNOSIS — M6281 Muscle weakness (generalized): Secondary | ICD-10-CM | POA: Diagnosis not present

## 2012-03-08 DIAGNOSIS — R262 Difficulty in walking, not elsewhere classified: Secondary | ICD-10-CM | POA: Diagnosis not present

## 2012-03-08 DIAGNOSIS — M6281 Muscle weakness (generalized): Secondary | ICD-10-CM | POA: Diagnosis not present

## 2012-03-10 DIAGNOSIS — M545 Low back pain, unspecified: Secondary | ICD-10-CM | POA: Diagnosis not present

## 2012-03-15 DIAGNOSIS — R262 Difficulty in walking, not elsewhere classified: Secondary | ICD-10-CM | POA: Diagnosis not present

## 2012-03-15 DIAGNOSIS — K5732 Diverticulitis of large intestine without perforation or abscess without bleeding: Secondary | ICD-10-CM | POA: Diagnosis not present

## 2012-03-15 DIAGNOSIS — R109 Unspecified abdominal pain: Secondary | ICD-10-CM | POA: Diagnosis not present

## 2012-03-15 DIAGNOSIS — M6281 Muscle weakness (generalized): Secondary | ICD-10-CM | POA: Diagnosis not present

## 2012-03-15 DIAGNOSIS — Z6829 Body mass index (BMI) 29.0-29.9, adult: Secondary | ICD-10-CM | POA: Diagnosis not present

## 2012-03-21 DIAGNOSIS — M545 Low back pain, unspecified: Secondary | ICD-10-CM | POA: Diagnosis not present

## 2012-03-30 ENCOUNTER — Encounter: Payer: Self-pay | Admitting: Internal Medicine

## 2012-04-01 DIAGNOSIS — M6281 Muscle weakness (generalized): Secondary | ICD-10-CM | POA: Diagnosis not present

## 2012-04-01 DIAGNOSIS — R262 Difficulty in walking, not elsewhere classified: Secondary | ICD-10-CM | POA: Diagnosis not present

## 2012-04-05 DIAGNOSIS — M6281 Muscle weakness (generalized): Secondary | ICD-10-CM | POA: Diagnosis not present

## 2012-04-05 DIAGNOSIS — R262 Difficulty in walking, not elsewhere classified: Secondary | ICD-10-CM | POA: Diagnosis not present

## 2012-04-07 DIAGNOSIS — R262 Difficulty in walking, not elsewhere classified: Secondary | ICD-10-CM | POA: Diagnosis not present

## 2012-04-07 DIAGNOSIS — M6281 Muscle weakness (generalized): Secondary | ICD-10-CM | POA: Diagnosis not present

## 2012-04-13 DIAGNOSIS — R262 Difficulty in walking, not elsewhere classified: Secondary | ICD-10-CM | POA: Diagnosis not present

## 2012-04-13 DIAGNOSIS — M6281 Muscle weakness (generalized): Secondary | ICD-10-CM | POA: Diagnosis not present

## 2012-04-13 HISTORY — PX: BACK SURGERY: SHX140

## 2012-04-18 DIAGNOSIS — M545 Low back pain, unspecified: Secondary | ICD-10-CM | POA: Diagnosis not present

## 2012-04-18 DIAGNOSIS — E039 Hypothyroidism, unspecified: Secondary | ICD-10-CM | POA: Diagnosis not present

## 2012-04-18 DIAGNOSIS — Z6829 Body mass index (BMI) 29.0-29.9, adult: Secondary | ICD-10-CM | POA: Diagnosis not present

## 2012-04-18 DIAGNOSIS — E119 Type 2 diabetes mellitus without complications: Secondary | ICD-10-CM | POA: Diagnosis not present

## 2012-04-21 DIAGNOSIS — M6281 Muscle weakness (generalized): Secondary | ICD-10-CM | POA: Diagnosis not present

## 2012-04-21 DIAGNOSIS — R262 Difficulty in walking, not elsewhere classified: Secondary | ICD-10-CM | POA: Diagnosis not present

## 2012-04-29 DIAGNOSIS — E119 Type 2 diabetes mellitus without complications: Secondary | ICD-10-CM | POA: Diagnosis not present

## 2012-04-29 DIAGNOSIS — M48061 Spinal stenosis, lumbar region without neurogenic claudication: Secondary | ICD-10-CM | POA: Diagnosis not present

## 2012-04-29 DIAGNOSIS — E039 Hypothyroidism, unspecified: Secondary | ICD-10-CM | POA: Diagnosis not present

## 2012-04-29 DIAGNOSIS — M545 Low back pain, unspecified: Secondary | ICD-10-CM | POA: Diagnosis not present

## 2012-05-02 DIAGNOSIS — M48061 Spinal stenosis, lumbar region without neurogenic claudication: Secondary | ICD-10-CM | POA: Diagnosis not present

## 2012-05-02 DIAGNOSIS — M5126 Other intervertebral disc displacement, lumbar region: Secondary | ICD-10-CM | POA: Diagnosis not present

## 2012-05-02 DIAGNOSIS — M545 Low back pain, unspecified: Secondary | ICD-10-CM | POA: Diagnosis not present

## 2012-05-02 DIAGNOSIS — IMO0002 Reserved for concepts with insufficient information to code with codable children: Secondary | ICD-10-CM | POA: Diagnosis not present

## 2012-05-03 ENCOUNTER — Other Ambulatory Visit: Payer: Self-pay | Admitting: Neurosurgery

## 2012-05-04 ENCOUNTER — Encounter (HOSPITAL_COMMUNITY): Payer: Self-pay | Admitting: Pharmacy Technician

## 2012-05-05 ENCOUNTER — Encounter (HOSPITAL_COMMUNITY): Payer: Self-pay

## 2012-05-05 ENCOUNTER — Inpatient Hospital Stay (HOSPITAL_COMMUNITY): Admission: RE | Admit: 2012-05-05 | Discharge: 2012-05-05 | Payer: Medicare Other | Source: Ambulatory Visit

## 2012-05-05 HISTORY — DX: Rheumatoid arthritis, unspecified: M06.9

## 2012-05-05 HISTORY — DX: Diverticulitis of intestine, part unspecified, without perforation or abscess without bleeding: K57.92

## 2012-05-05 HISTORY — DX: Hyperlipidemia, unspecified: E78.5

## 2012-05-05 HISTORY — DX: Unspecified osteoarthritis, unspecified site: M19.90

## 2012-05-05 HISTORY — DX: Hypothyroidism, unspecified: E03.9

## 2012-05-05 NOTE — Pre-Procedure Instructions (Signed)
[place]  05/05/2012   Your procedure is scheduled on: May 28  Report to [facility] at [place].  Call this number if you have problems the morning of surgery: [phone]   Remember:   Do not eat food:After Midnight.  May have clear liquids: up to 4 Hours before arrival.  Clear liquids include soda, tea, black coffee, apple or grape juice, broth.  Take these medicines the morning of surgery with A SIP OF WATER: Tylenol, Norvasc, Neutontin, Hydrocodone if needed, normal morning Eye drops, Levothyroxine,    STOP all vitamins and fish oil today (5/24)  Do not wear jewelry, make-up or nail polish.  Do not wear lotions, powders, or perfumes. You may wear deodorant.  Do not shave 48 hours prior to surgery. Men may shave face and neck.  Do not bring valuables to the hospital.  Contacts, dentures or bridgework may not be worn into surgery.  Leave suitcase in the car. After surgery it may be brought to your room.  For patients admitted to the hospital, checkout time is 11:00 AM the day of discharge.     Special Instructions: CHG Shower Use Special Wash: 1/2 bottle night before surgery and 1/2 bottle morning of surgery.   Please read over the following fact sheets that you were given: Pain Booklet, Coughing and Deep Breathing, Blood Transfusion Information and Surgical Site Infection Prevention

## 2012-05-06 ENCOUNTER — Encounter (HOSPITAL_COMMUNITY)
Admission: RE | Admit: 2012-05-06 | Discharge: 2012-05-06 | Disposition: A | Discharge status: Home / Self Care | Payer: Medicare Other | Source: Ambulatory Visit | Attending: Neurosurgery | Admitting: Neurosurgery

## 2012-05-06 DIAGNOSIS — I359 Nonrheumatic aortic valve disorder, unspecified: Secondary | ICD-10-CM | POA: Diagnosis not present

## 2012-05-06 DIAGNOSIS — Z01811 Encounter for preprocedural respiratory examination: Secondary | ICD-10-CM | POA: Diagnosis not present

## 2012-05-06 DIAGNOSIS — R011 Cardiac murmur, unspecified: Secondary | ICD-10-CM | POA: Diagnosis not present

## 2012-05-06 LAB — BASIC METABOLIC PANEL
BUN: 12 mg/dL (ref 6–23)
Chloride: 95 mEq/L — ABNORMAL LOW (ref 96–112)
Creatinine, Ser: 0.61 mg/dL (ref 0.50–1.10)
GFR calc Af Amer: >90 mL/min (ref >90)

## 2012-05-06 LAB — CBC
HCT: 42.1 % (ref 36.0–46.0)
MCH: 31 pg (ref 26.0–34.0)
MCHC: 34.9 g/dL (ref 30.0–36.0)
RDW: 13.4 % (ref 11.5–15.5)

## 2012-05-06 LAB — SURGICAL PCR SCREEN: MRSA, PCR: NEGATIVE

## 2012-05-06 NOTE — Progress Notes (Signed)
Spoke with Ebony Hail regarding EKG, ECHO and cardiac notes. No further review needed per [place], [place]

## 2012-05-10 ENCOUNTER — Inpatient Hospital Stay (HOSPITAL_COMMUNITY): Payer: Medicare Other

## 2012-05-10 ENCOUNTER — Encounter (HOSPITAL_COMMUNITY): Payer: Self-pay | Admitting: *Deleted

## 2012-05-10 ENCOUNTER — Inpatient Hospital Stay (HOSPITAL_COMMUNITY)
Admission: RE | Admit: 2012-05-10 | Discharge: 2012-05-13 | DRG: 460 | Disposition: A | Discharge status: Home / Self Care | Payer: Medicare Other | Source: Ambulatory Visit | Attending: Neurosurgery | Admitting: Neurosurgery

## 2012-05-10 ENCOUNTER — Encounter (HOSPITAL_COMMUNITY): Payer: Self-pay | Admitting: Anesthesiology

## 2012-05-10 ENCOUNTER — Encounter (HOSPITAL_COMMUNITY)
Admission: RE | Disposition: A | Discharge status: Home / Self Care | Payer: Self-pay | Source: Ambulatory Visit | Attending: Neurosurgery

## 2012-05-10 ENCOUNTER — Inpatient Hospital Stay (HOSPITAL_COMMUNITY): Payer: Medicare Other | Admitting: Anesthesiology

## 2012-05-10 DIAGNOSIS — IMO0002 Reserved for concepts with insufficient information to code with codable children: Secondary | ICD-10-CM | POA: Diagnosis not present

## 2012-05-10 DIAGNOSIS — I10 Essential (primary) hypertension: Secondary | ICD-10-CM | POA: Diagnosis present

## 2012-05-10 DIAGNOSIS — Z7982 Long term (current) use of aspirin: Secondary | ICD-10-CM | POA: Diagnosis not present

## 2012-05-10 DIAGNOSIS — M5126 Other intervertebral disc displacement, lumbar region: Secondary | ICD-10-CM | POA: Diagnosis not present

## 2012-05-10 DIAGNOSIS — M5137 Other intervertebral disc degeneration, lumbosacral region: Secondary | ICD-10-CM | POA: Diagnosis not present

## 2012-05-10 DIAGNOSIS — D62 Acute posthemorrhagic anemia: Secondary | ICD-10-CM | POA: Diagnosis not present

## 2012-05-10 DIAGNOSIS — I359 Nonrheumatic aortic valve disorder, unspecified: Secondary | ICD-10-CM | POA: Diagnosis present

## 2012-05-10 DIAGNOSIS — Z472 Encounter for removal of internal fixation device: Secondary | ICD-10-CM | POA: Diagnosis not present

## 2012-05-10 DIAGNOSIS — E785 Hyperlipidemia, unspecified: Secondary | ICD-10-CM | POA: Diagnosis present

## 2012-05-10 DIAGNOSIS — Z981 Arthrodesis status: Secondary | ICD-10-CM | POA: Diagnosis not present

## 2012-05-10 DIAGNOSIS — Z79899 Other long term (current) drug therapy: Secondary | ICD-10-CM

## 2012-05-10 DIAGNOSIS — M069 Rheumatoid arthritis, unspecified: Secondary | ICD-10-CM | POA: Diagnosis present

## 2012-05-10 DIAGNOSIS — Z01812 Encounter for preprocedural laboratory examination: Secondary | ICD-10-CM | POA: Diagnosis not present

## 2012-05-10 DIAGNOSIS — M545 Low back pain, unspecified: Secondary | ICD-10-CM | POA: Diagnosis not present

## 2012-05-10 DIAGNOSIS — E119 Type 2 diabetes mellitus without complications: Secondary | ICD-10-CM | POA: Diagnosis not present

## 2012-05-10 DIAGNOSIS — M48061 Spinal stenosis, lumbar region without neurogenic claudication: Secondary | ICD-10-CM | POA: Diagnosis not present

## 2012-05-10 DIAGNOSIS — H409 Unspecified glaucoma: Secondary | ICD-10-CM | POA: Diagnosis present

## 2012-05-10 DIAGNOSIS — M51379 Other intervertebral disc degeneration, lumbosacral region without mention of lumbar back pain or lower extremity pain: Secondary | ICD-10-CM | POA: Diagnosis present

## 2012-05-10 DIAGNOSIS — E039 Hypothyroidism, unspecified: Secondary | ICD-10-CM | POA: Diagnosis present

## 2012-05-10 LAB — GLUCOSE, CAPILLARY

## 2012-05-10 SURGERY — POSTERIOR LUMBAR FUSION 1 LEVEL
Anesthesia: General | Site: Back | Wound class: Clean

## 2012-05-10 MED ORDER — PROPOFOL 10 MG/ML IV EMUL
INTRAVENOUS | Status: DC | PRN
  Administered 2012-05-10: 180 mg via INTRAVENOUS

## 2012-05-10 MED ORDER — ROCURONIUM BROMIDE 100 MG/10ML IV SOLN
INTRAVENOUS | Status: DC | PRN
  Administered 2012-05-10: 50 mg via INTRAVENOUS

## 2012-05-10 MED ORDER — ZOLPIDEM TARTRATE 5 MG PO TABS: 5.0000 mg | ORAL_TABLET | Freq: Every evening | ORAL | Status: DC | PRN

## 2012-05-10 MED ORDER — ENALAPRIL MALEATE 20 MG PO TABS
20.0000 mg | ORAL_TABLET | Freq: Every day | ORAL | Status: DC
  Administered 2012-05-10 – 2012-05-13 (×4): 20 mg via ORAL
  Filled 2012-05-10 (×4): qty 1

## 2012-05-10 MED ORDER — GABAPENTIN 100 MG PO CAPS
100.0000 mg | ORAL_CAPSULE | Freq: Two times a day (BID) | ORAL | Status: DC
  Administered 2012-05-10 – 2012-05-13 (×7): 100 mg via ORAL
  Filled 2012-05-10 (×8): qty 1

## 2012-05-10 MED ORDER — POTASSIUM CHLORIDE CRYS ER 20 MEQ PO TBCR
20.0000 meq | EXTENDED_RELEASE_TABLET | Freq: Every day | ORAL | Status: DC
  Administered 2012-05-10 – 2012-05-13 (×4): 20 meq via ORAL
  Filled 2012-05-10 (×5): qty 1

## 2012-05-10 MED ORDER — VECURONIUM BROMIDE 10 MG IV SOLR
INTRAVENOUS | Status: DC | PRN
  Administered 2012-05-10: 1 mg via INTRAVENOUS
  Administered 2012-05-10: 2 mg via INTRAVENOUS
  Administered 2012-05-10: 1 mg via INTRAVENOUS

## 2012-05-10 MED ORDER — HYDROMORPHONE HCL PF 1 MG/ML IJ SOLN
0.2500 mg | INTRAMUSCULAR | Status: DC | PRN
  Administered 2012-05-10 (×2): 0.5 mg via INTRAVENOUS

## 2012-05-10 MED ORDER — BUPIVACAINE-EPINEPHRINE PF 0.5-1:200000 % IJ SOLN
INTRAMUSCULAR | Status: DC | PRN
  Administered 2012-05-10: 30 mL

## 2012-05-10 MED ORDER — SODIUM CHLORIDE 0.9 % IJ SOLN: 3.0000 mL | Freq: Two times a day (BID) | INTRAMUSCULAR | Status: DC

## 2012-05-10 MED ORDER — NEOSTIGMINE METHYLSULFATE 1 MG/ML IJ SOLN
INTRAMUSCULAR | Status: DC | PRN
  Administered 2012-05-10: 2 mg via INTRAVENOUS
  Administered 2012-05-10: 1 mg via INTRAVENOUS

## 2012-05-10 MED ORDER — GLIPIZIDE ER 5 MG PO TB24
5.0000 mg | ORAL_TABLET | Freq: Every day | ORAL | Status: DC
  Administered 2012-05-11 – 2012-05-13 (×3): 5 mg via ORAL
  Filled 2012-05-10 (×3): qty 1

## 2012-05-10 MED ORDER — HETASTARCH-ELECTROLYTES 6 % IV SOLN
INTRAVENOUS | Status: DC | PRN
  Administered 2012-05-10: 11:00:00 via INTRAVENOUS

## 2012-05-10 MED ORDER — HYDROCHLOROTHIAZIDE 25 MG PO TABS
12.5000 mg | ORAL_TABLET | Freq: Every day | ORAL | Status: DC
  Administered 2012-05-10 – 2012-05-11 (×2): 12.5 mg via ORAL
  Filled 2012-05-10 (×2): qty 0.5

## 2012-05-10 MED ORDER — LEVOTHYROXINE SODIUM 88 MCG PO TABS
88.0000 ug | ORAL_TABLET | Freq: Every day | ORAL | Status: DC
  Administered 2012-05-11 – 2012-05-13 (×3): 88 ug via ORAL
  Filled 2012-05-10 (×3): qty 1

## 2012-05-10 MED ORDER — SODIUM CHLORIDE 0.9 % IJ SOLN: 3.0000 mL | INTRAMUSCULAR | Status: DC | PRN

## 2012-05-10 MED ORDER — TIMOLOL MALEATE 0.5 % OP SOLN
1.0000 [drp] | Freq: Every day | OPHTHALMIC | Status: DC
  Administered 2012-05-11 – 2012-05-12 (×2): 1 [drp] via OPHTHALMIC
  Filled 2012-05-10: qty 5

## 2012-05-10 MED ORDER — OXYCODONE-ACETAMINOPHEN 5-325 MG PO TABS
1.0000 | ORAL_TABLET | ORAL | Status: DC | PRN
  Administered 2012-05-10 – 2012-05-13 (×8): 2 via ORAL
  Filled 2012-05-10 (×8): qty 2

## 2012-05-10 MED ORDER — CEFAZOLIN SODIUM 1-5 GM-% IV SOLN
INTRAVENOUS | Status: AC
  Filled 2012-05-10: qty 50

## 2012-05-10 MED ORDER — SODIUM CHLORIDE 0.9 % IV SOLN: INTRAVENOUS | Status: DC

## 2012-05-10 MED ORDER — CEFAZOLIN SODIUM 1-5 GM-% IV SOLN
INTRAVENOUS | Status: DC | PRN
  Administered 2012-05-10: 1 g via INTRAVENOUS

## 2012-05-10 MED ORDER — POTASSIUM CHLORIDE IN NACL 20-0.9 MEQ/L-% IV SOLN
INTRAVENOUS | Status: DC
  Administered 2012-05-10 – 2012-05-11 (×2): via INTRAVENOUS
  Filled 2012-05-10 (×4): qty 1000

## 2012-05-10 MED ORDER — CEFAZOLIN SODIUM 1-5 GM-% IV SOLN
1.0000 g | Freq: Three times a day (TID) | INTRAVENOUS | Status: AC
  Administered 2012-05-10 – 2012-05-11 (×2): 1 g via INTRAVENOUS
  Filled 2012-05-10 (×2): qty 50

## 2012-05-10 MED ORDER — EPHEDRINE SULFATE 50 MG/ML IJ SOLN
INTRAMUSCULAR | Status: DC | PRN
  Administered 2012-05-10 (×3): 5 mg via INTRAVENOUS
  Administered 2012-05-10: 10 mg via INTRAVENOUS
  Administered 2012-05-10 (×4): 5 mg via INTRAVENOUS

## 2012-05-10 MED ORDER — THROMBIN 20000 UNITS EX KIT
PACK | CUTANEOUS | Status: DC | PRN
  Administered 2012-05-10: 20000 [IU] via TOPICAL

## 2012-05-10 MED ORDER — FENTANYL CITRATE 0.05 MG/ML IJ SOLN
INTRAMUSCULAR | Status: DC | PRN
  Administered 2012-05-10: 100 ug via INTRAVENOUS
  Administered 2012-05-10 (×4): 25 ug via INTRAVENOUS
  Administered 2012-05-10: 50 ug via INTRAVENOUS

## 2012-05-10 MED ORDER — DIAZEPAM 5 MG PO TABS
5.0000 mg | ORAL_TABLET | Freq: Four times a day (QID) | ORAL | Status: DC | PRN
  Administered 2012-05-10 – 2012-05-11 (×3): 5 mg via ORAL
  Filled 2012-05-10 (×4): qty 1

## 2012-05-10 MED ORDER — LACTATED RINGERS IV SOLN
INTRAVENOUS | Status: DC | PRN
  Administered 2012-05-10 (×2): via INTRAVENOUS

## 2012-05-10 MED ORDER — ONDANSETRON HCL 4 MG/2ML IJ SOLN
INTRAMUSCULAR | Status: DC | PRN
  Administered 2012-05-10: 4 mg via INTRAVENOUS

## 2012-05-10 MED ORDER — ACETAMINOPHEN 325 MG PO TABS: 650.0000 mg | ORAL_TABLET | ORAL | Status: DC | PRN

## 2012-05-10 MED ORDER — MENTHOL 3 MG MT LOZG
1.0000 | LOZENGE | OROMUCOSAL | Status: DC | PRN
  Filled 2012-05-10: qty 9

## 2012-05-10 MED ORDER — ACETAMINOPHEN 650 MG RE SUPP: 650.0000 mg | RECTAL | Status: DC | PRN

## 2012-05-10 MED ORDER — HYDROMORPHONE HCL PF 1 MG/ML IJ SOLN
INTRAMUSCULAR | Status: AC
  Administered 2012-05-10: 0.5 mg via INTRAVENOUS
  Filled 2012-05-10: qty 1

## 2012-05-10 MED ORDER — PHENOL 1.4 % MT LIQD
1.0000 | OROMUCOSAL | Status: DC | PRN
  Administered 2012-05-10: 1 via OROMUCOSAL
  Filled 2012-05-10: qty 177

## 2012-05-10 MED ORDER — AMLODIPINE BESYLATE 10 MG PO TABS
10.0000 mg | ORAL_TABLET | Freq: Every day | ORAL | Status: DC
  Administered 2012-05-11 – 2012-05-13 (×3): 10 mg via ORAL
  Filled 2012-05-10 (×3): qty 1

## 2012-05-10 MED ORDER — LATANOPROST 0.005 % OP SOLN
1.0000 [drp] | Freq: Every day | OPHTHALMIC | Status: DC
  Administered 2012-05-10 – 2012-05-12 (×3): 1 [drp] via OPHTHALMIC
  Filled 2012-05-10: qty 2.5

## 2012-05-10 MED ORDER — MORPHINE SULFATE 4 MG/ML IJ SOLN
4.0000 mg | INTRAMUSCULAR | Status: DC | PRN
  Administered 2012-05-10: 4 mg via INTRAVENOUS
  Filled 2012-05-10: qty 1

## 2012-05-10 MED ORDER — SODIUM CHLORIDE 0.9 % IV SOLN: 250.0000 mL | INTRAVENOUS | Status: DC

## 2012-05-10 MED ORDER — HEMOSTATIC AGENTS (NO CHARGE) OPTIME
TOPICAL | Status: DC | PRN
  Administered 2012-05-10: 1 via TOPICAL

## 2012-05-10 MED ORDER — METFORMIN HCL 500 MG PO TABS
500.0000 mg | ORAL_TABLET | Freq: Two times a day (BID) | ORAL | Status: DC
  Administered 2012-05-10 – 2012-05-13 (×6): 500 mg via ORAL
  Filled 2012-05-10 (×8): qty 1

## 2012-05-10 MED ORDER — MIDAZOLAM HCL 5 MG/5ML IJ SOLN
INTRAMUSCULAR | Status: DC | PRN
  Administered 2012-05-10: 2 mg via INTRAVENOUS

## 2012-05-10 MED ORDER — LIDOCAINE HCL (CARDIAC) 20 MG/ML IV SOLN
INTRAVENOUS | Status: DC | PRN
  Administered 2012-05-10: 80 mg via INTRAVENOUS

## 2012-05-10 MED ORDER — 0.9 % SODIUM CHLORIDE (POUR BTL) OPTIME
TOPICAL | Status: DC | PRN
  Administered 2012-05-10: 1000 mL

## 2012-05-10 MED ORDER — SIMVASTATIN 20 MG PO TABS
20.0000 mg | ORAL_TABLET | Freq: Every day | ORAL | Status: DC
  Administered 2012-05-10 – 2012-05-12 (×3): 20 mg via ORAL
  Filled 2012-05-10 (×4): qty 1

## 2012-05-10 MED ORDER — GLYCOPYRROLATE 0.2 MG/ML IJ SOLN
INTRAMUSCULAR | Status: DC | PRN
  Administered 2012-05-10: .4 mg via INTRAVENOUS
  Administered 2012-05-10: 0.2 mg via INTRAVENOUS

## 2012-05-10 MED ORDER — ONDANSETRON HCL 4 MG/2ML IJ SOLN: 4.0000 mg | INTRAMUSCULAR | Status: DC | PRN

## 2012-05-10 SURGICAL SUPPLY — 69 items
BENZOIN TINCTURE PRP APPL 2/3 (GAUZE/BANDAGES/DRESSINGS) ×2 IMPLANT
BLADE SURG ROTATE 9660 (MISCELLANEOUS) IMPLANT
BONE MATRIX OSTEOCEL PLUS 10CC (Bone Implant) ×2 IMPLANT
BUR ACORN 6.0 (BURR) ×2 IMPLANT
BUR MATCHSTICK NEURO 3.0 LAGG (BURR) ×2 IMPLANT
CANISTER SUCTION 2500CC (MISCELLANEOUS) ×2 IMPLANT
CAP REVERE LOCKING (Cap) ×12 IMPLANT
CLOTH BEACON ORANGE TIMEOUT ST (SAFETY) ×2 IMPLANT
CONT SPEC 4OZ CLIKSEAL STRL BL (MISCELLANEOUS) ×4 IMPLANT
COVER BACK TABLE 24X17X13 BIG (DRAPES) IMPLANT
COVER TABLE BACK 60X90 (DRAPES) ×2 IMPLANT
DRAPE C-ARM 42X72 X-RAY (DRAPES) ×4 IMPLANT
DRAPE LAPAROTOMY 100X72X124 (DRAPES) ×2 IMPLANT
DRAPE POUCH INSTRU U-SHP 10X18 (DRAPES) ×2 IMPLANT
DRSG PAD ABDOMINAL 8X10 ST (GAUZE/BANDAGES/DRESSINGS) ×2 IMPLANT
DURAPREP 26ML APPLICATOR (WOUND CARE) ×2 IMPLANT
ELECT REM PT RETURN 9FT ADLT (ELECTROSURGICAL) ×2
ELECTRODE REM PT RTRN 9FT ADLT (ELECTROSURGICAL) ×1 IMPLANT
EVACUATOR 1/8 PVC DRAIN (DRAIN) IMPLANT
GAUZE SPONGE 2X2 8PLY STRL LF (GAUZE/BANDAGES/DRESSINGS) ×1 IMPLANT
GAUZE SPONGE 4X4 16PLY XRAY LF (GAUZE/BANDAGES/DRESSINGS) ×2 IMPLANT
GLOVE BIOGEL M 8.0 STRL (GLOVE) ×4 IMPLANT
GLOVE BIOGEL PI IND STRL 7.0 (GLOVE) ×1 IMPLANT
GLOVE BIOGEL PI INDICATOR 7.0 (GLOVE) ×1
GLOVE ECLIPSE 7.5 STRL STRAW (GLOVE) ×10 IMPLANT
GLOVE EXAM NITRILE LRG STRL (GLOVE) IMPLANT
GLOVE EXAM NITRILE MD LF STRL (GLOVE) ×2 IMPLANT
GLOVE EXAM NITRILE XL STR (GLOVE) IMPLANT
GLOVE EXAM NITRILE XS STR PU (GLOVE) IMPLANT
GLOVE INDICATOR 8.0 STRL GRN (GLOVE) ×6 IMPLANT
GLOVE SKINSENSE NS SZ6.5 (GLOVE) ×2
GLOVE SKINSENSE STRL SZ6.5 (GLOVE) ×2 IMPLANT
GOWN BRE IMP SLV AUR LG STRL (GOWN DISPOSABLE) IMPLANT
GOWN BRE IMP SLV AUR XL STRL (GOWN DISPOSABLE) IMPLANT
GOWN STRL REIN 2XL LVL4 (GOWN DISPOSABLE) IMPLANT
KIT BASIN OR (CUSTOM PROCEDURE TRAY) ×2 IMPLANT
KIT ROOM TURNOVER OR (KITS) ×2 IMPLANT
MILL MEDIUM DISP (BLADE) ×2 IMPLANT
NEEDLE HYPO 18GX1.5 BLUNT FILL (NEEDLE) IMPLANT
NEEDLE HYPO 21X1.5 SAFETY (NEEDLE) IMPLANT
NEEDLE HYPO 25X1 1.5 SAFETY (NEEDLE) ×2 IMPLANT
NS IRRIG 1000ML POUR BTL (IV SOLUTION) ×2 IMPLANT
PACK LAMINECTOMY NEURO (CUSTOM PROCEDURE TRAY) ×2 IMPLANT
PAD ARMBOARD 7.5X6 YLW CONV (MISCELLANEOUS) ×6 IMPLANT
PATTIES SURGICAL .5 X1 (DISPOSABLE) IMPLANT
PATTIES SURGICAL .5 X3 (DISPOSABLE) IMPLANT
PATTIES SURGICAL 1X1 (DISPOSABLE) ×2 IMPLANT
ROD REVERE 6.35 CURVED 55MM (Rod) ×4 IMPLANT
SCREW REVERE 6.35 5.5X40MM (Screw) ×10 IMPLANT
SCREW REVERE 6.35 6.5X40MM (Screw) ×2 IMPLANT
SPACER SUSTAIN O SM 8X22 10M (Spacer) ×4 IMPLANT
SPONGE GAUZE 2X2 STER 10/PKG (GAUZE/BANDAGES/DRESSINGS) ×1
SPONGE GAUZE 4X4 12PLY (GAUZE/BANDAGES/DRESSINGS) IMPLANT
SPONGE LAP 4X18 X RAY DECT (DISPOSABLE) IMPLANT
SPONGE NEURO XRAY DETECT 1X3 (DISPOSABLE) IMPLANT
SPONGE SURGIFOAM ABS GEL 100 (HEMOSTASIS) ×2 IMPLANT
STRIP CLOSURE SKIN 1/2X4 (GAUZE/BANDAGES/DRESSINGS) ×2 IMPLANT
SUT VIC AB 1 CT1 18XBRD ANBCTR (SUTURE) ×1 IMPLANT
SUT VIC AB 1 CT1 8-18 (SUTURE) ×1
SUT VIC AB 2-0 CP2 18 (SUTURE) ×2 IMPLANT
SUT VIC AB 3-0 SH 8-18 (SUTURE) ×2 IMPLANT
SYR 20CC LL (SYRINGE) IMPLANT
SYR 20ML ECCENTRIC (SYRINGE) ×2 IMPLANT
SYR 5ML LL (SYRINGE) IMPLANT
TAPE CLOTH SURG 4X10 WHT LF (GAUZE/BANDAGES/DRESSINGS) ×2 IMPLANT
TOWEL OR 17X24 6PK STRL BLUE (TOWEL DISPOSABLE) ×2 IMPLANT
TOWEL OR 17X26 10 PK STRL BLUE (TOWEL DISPOSABLE) ×2 IMPLANT
TRAY FOLEY CATH 14FRSI W/METER (CATHETERS) ×2 IMPLANT
WATER STERILE IRR 1000ML POUR (IV SOLUTION) ×2 IMPLANT

## 2012-05-10 NOTE — Progress Notes (Signed)
Spoke with Janett Billow at Dr. Harley Hallmark office requesting that she have Dr. Joya Salm sign orders. Orders noted to be pended.

## 2012-05-10 NOTE — H&P (Signed)
[name] [name] is an 66 y.o. female.   Chief Complaint: lbp  HPI: patient who came complaining of lbp with radiation to the right leg and some to the left.she has had epidural injection, medications and chiropractor treatment without any improvement. Mri of the lumbar spine was done and because of the findings she is been admited to surgery  Past Medical History  Diagnosis Date  . Diabetes mellitus   . Hypertension   . Thyroid disease   . Arthritis   . Hypothyroidism   . Diverticulitis   . Glaucoma   . Hyperlipidemia   . Rheumatoid arthritis   . Osteoarthritis   . Heart murmur   . Aortic stenosis     Past Surgical History  Procedure Date  . Foot surgery     left   . Abdominal hysterectomy   . Cholecystectomy   . Back surgery     fusion  . Appendectomy     Family History  Problem Relation Age of Onset  . Anesthesia problems Neg Hx    Social History:  reports that she has never smoked. She does not have any smokeless tobacco history on file. She reports that she drinks alcohol. She reports that she does not use illicit drugs.  Allergies: No Known Allergies  Medications Prior to Admission  Medication Sig Dispense Refill  . acetaminophen (TYLENOL) 650 MG CR tablet Take 650 mg by mouth every 4 (four) hours as needed. For pain       . amLODipine (NORVASC) 10 MG tablet Take 10 mg by mouth daily.        . Ascorbic Acid (VITAMIN C PO) Take 1 tablet by mouth daily.       Marland Kitchen aspirin EC 81 MG tablet Take 81 mg by mouth daily.        . B Complex-C (B-COMPLEX WITH VITAMIN C) tablet Take 1 tablet by mouth daily.        Marland Kitchen CALCIUM PO Take 1 tablet by mouth every evening.        . enalapril (VASOTEC) 20 MG tablet Take 20 mg by mouth daily.        Marland Kitchen gabapentin (NEURONTIN) 100 MG capsule Take 100 mg by mouth 2 (two) times daily.       Marland Kitchen glipiZIDE (GLUCOTROL XL) 5 MG 24 hr tablet Take 5 mg by mouth daily.        Marland Kitchen glucosamine-chondroitin 500-400 MG tablet Take 1 tablet by mouth daily.         . hydrochlorothiazide 25 MG tablet Take 12.5 mg by mouth daily.       Marland Kitchen HYDROcodone-acetaminophen (VICODIN) 5-500 MG per tablet Take 1 tablet by mouth every 6 (six) hours as needed. For pain       . latanoprost (XALATAN) 0.005 % ophthalmic solution Place 1 drop into both eyes at bedtime.        Marland Kitchen levothyroxine (SYNTHROID, LEVOTHROID) 88 MCG tablet Take 88 mcg by mouth daily.        . meloxicam (MOBIC) 15 MG tablet Take 15 mg by mouth daily.        . metFORMIN (GLUCOPHAGE) 500 MG tablet Take 500 mg by mouth 2 (two) times daily with a meal.        . omega-3 acid ethyl esters (LOVAZA) 1 G capsule Take 2 g by mouth daily.        . potassium chloride SA (K-DUR,KLOR-CON) 20 MEQ tablet Take 20 mEq by mouth daily.       Marland Kitchen  Prenatal Vit-Fe Fumarate-FA (PRENATAL MULTIVITAMIN) TABS Take 1 tablet by mouth daily.      . pseudoephedrine (SUDAFED) 30 MG tablet Take 30 mg by mouth as needed. Patient unsure dose      . simvastatin (ZOCOR) 20 MG tablet Take 20 mg by mouth at bedtime.        . timolol (TIMOPTIC) 0.5 % ophthalmic solution Place 1 drop into both eyes every morning.        Marland Kitchen VITAMIN D, CHOLECALCIFEROL, PO Take 1 tablet by mouth daily.        Marland Kitchen VITAMIN E PO Take 1 tablet by mouth daily.        . Nutritional Supplements (MELATONIN PO) Take 2 tablets by mouth at bedtime.      . sodium chloride (OCEAN) 0.65 % nasal spray Place 1 spray into the nose as needed.        Results for orders placed during the hospital encounter of 05/10/12 (from the past 48 hour(s))  GLUCOSE, CAPILLARY     Status: Abnormal   Collection Time   05/10/12  6:45 AM      Component Value Range Comment   Glucose-Capillary 113 (*) 70 - 99 (mg/dL)    No results found.  Review of Systems  Constitutional: Negative.   HENT: Negative.   Eyes: Negative.   Respiratory: Negative.   Cardiovascular: Negative.   Gastrointestinal: Negative.   Genitourinary: Negative.   Musculoskeletal: Positive for back pain.  Skin: Negative.     Neurological: Positive for focal weakness.  Endo/Heme/Allergies: Negative.   Psychiatric/Behavioral: Negative.     Blood pressure 167/81, pulse 75, temperature 98 F (36.7 C), temperature source Oral, resp. rate 18, SpO2 95.00%. Physical Exam hent, nl. Neck,nl. Cv, nl . Lungs nl. Abdomen.nl. Exyremities, nl. NEURO weakness of right foot 3/5, left, 4/5.  slr positive at 60. Mri lumbar, ddd at l45, hnp to the right and bilateral foraminal stenosis. Fusion at l5s1,wnl  Assessment/Plandecompression and fusion at l45 wirh revision of l5s1. Patient is aware of risks and benefits of the surgery  [name] [name] 05/10/2012, 8:06 AM

## 2012-05-10 NOTE — Anesthesia Preprocedure Evaluation (Addendum)
Anesthesia Evaluation  Patient identified by MRN, date of birth, ID band Patient awake    Reviewed: Allergy & Precautions, H&P , NPO status , Patient's Chart, lab work & pertinent test results  History of Anesthesia Complications (+) DIFFICULT AIRWAY  Airway Mallampati: II TM Distance: >3 FB     Dental  (+) Teeth Intact and Dental Advisory Given   Pulmonary neg pulmonary ROS,  breath sounds clear to auscultation        Cardiovascular hypertension, Pt. on medications + Valvular Problems/Murmurs AS and AI Rhythm:Regular Rate:Normal  History of heart murmur in the past. Cardiac notes appreciated. Patient is asymptomatic. CE   Neuro/Psych negative neurological ROS  negative psych ROS   GI/Hepatic negative GI ROS, Neg liver ROS, Diverticulitis   Endo/Other  Diabetes mellitus-, Well Controlled, Type 2, Oral Hypoglycemic AgentsHypothyroidism   Renal/GU negative Renal ROS     Musculoskeletal  (+) Arthritis -, Osteoarthritis and Rheumatoid disorders,    Abdominal   Peds  Hematology negative hematology ROS (+)   Anesthesia Other Findings   Reproductive/Obstetrics negative OB ROS                        Anesthesia Physical Anesthesia Plan  ASA: III  Anesthesia Plan: General   Post-op Pain Management:    Induction: Intravenous  Airway Management Planned: Oral ETT  Additional Equipment:   Intra-op Plan:   Post-operative Plan: Extubation in OR  Informed Consent: I have reviewed the patients History and Physical, chart, labs and discussed the procedure including the risks, benefits and alternatives for the proposed anesthesia with the patient or authorized representative who has indicated his/her understanding and acceptance.     Plan Discussed with: CRNA  Anesthesia Plan Comments:         Anesthesia Quick Evaluation

## 2012-05-10 NOTE — Care Management Note (Signed)
    Page 1 of 1   05/16/2012     10:37:51 AM   CARE MANAGEMENT NOTE 05/16/2012  Patient:  AJOURNEE, SPEAKMAN   Account Number:  [id number]  Date Initiated:  05/10/2012  Documentation initiated by:  Lars Pinks  Subjective/Objective Assessment:   PT WAS ADMITTED FOR SURG     Action/Plan:   PROGRESSION OF CARE AND DISCHARGE PLANNING   Anticipated DC Date:  05/12/2012   Anticipated DC Plan:  [place]  CM consult      Choice offered to / List presented to:             Status of service:  Completed, signed off Medicare Important Message given?   (If response is "NO", the following Medicare IM given date fields will be blank) Date Medicare IM given:   Date Additional Medicare IM given:    Discharge Disposition:  HOME/SELF CARE  Per UR Regulation:  Reviewed for med. necessity/level of care/duration of stay  If discussed at [place] of Stay Meetings, dates discussed:    Comments:  05/13/12 Lars Pinks, RN, BSN [place]  05/10/12 Lars Pinks, RN, BSN [place].  PTA PT WAS AT HOME WITH SELF/ FAMILY CARE.  WILL F/U ON DC NEEDS

## 2012-05-10 NOTE — Preoperative (Signed)
Beta Blockers   Reason not to administer Beta Blockers:Not Applicable 

## 2012-05-10 NOTE — Anesthesia Procedure Notes (Signed)
Procedure Name: Intubation Date/Time: 05/10/2012 8:26 AM Performed by: [place] Pink Pre-anesthesia Checklist: Patient identified, Timeout performed, Emergency Drugs available, Suction available and Patient being monitored Patient Re-evaluated:Patient Re-evaluated prior to inductionOxygen Delivery Method: Circle system utilized Preoxygenation: Pre-oxygenation with 100% oxygen Intubation Type: IV induction Ventilation: Mask ventilation without difficulty Laryngoscope size: Glidescope utilized. Grade View: Grade II Tube type: Oral Tube size: 7.0 mm Number of attempts: 1 Airway Equipment and Method: Stylet and Video-laryngoscopy Placement Confirmation: ETT inserted through vocal cords under direct vision,  positive ETCO2 and breath sounds checked- equal and bilateral Secured at: 21 cm Tube secured with: Tape Dental Injury: Teeth and Oropharynx as per pre-operative assessment

## 2012-05-10 NOTE — Progress Notes (Signed)
l45 fusion was done as well as revision of l5s1. Op note [phone]

## 2012-05-10 NOTE — Anesthesia Postprocedure Evaluation (Signed)
  Anesthesia Post-op Note  Patient: [name] [name]  Procedure(s) Performed: Procedure(s) (LRB): POSTERIOR LUMBAR FUSION 1 LEVEL (N/A)  Patient Location: PACU  Anesthesia Type: General  Level of Consciousness: awake  Airway and Oxygen Therapy: Patient Spontanous Breathing  Post-op Pain: mild  Post-op Assessment: Post-op Vital signs reviewed  Post-op Vital Signs: Reviewed  Complications: No apparent anesthesia complications

## 2012-05-10 NOTE — Transfer of Care (Signed)
Immediate Anesthesia Transfer of Care Note  Patient: [name] [name]  Procedure(s) Performed: Procedure(s) (LRB): POSTERIOR LUMBAR FUSION 1 LEVEL (N/A)  Patient Location: PACU  Anesthesia Type: General  Level of Consciousness: awake, alert  and oriented  Airway & Oxygen Therapy: Patient Spontanous Breathing and Patient connected to nasal cannula oxygen  Post-op Assessment: Report given to PACU RN and Post -op Vital signs reviewed and stable  Post vital signs: Reviewed and stable  Complications: No apparent anesthesia complications

## 2012-05-11 LAB — CBC
MCH: 30.4 pg (ref 26.0–34.0)
MCV: 90.2 fL (ref 78.0–100.0)
Platelets: 173 10*3/uL (ref 150–400)
RDW: 14 % (ref 11.5–15.5)
WBC: 9.4 10*3/uL (ref 4.0–10.5)

## 2012-05-11 MED ORDER — HYDROCHLOROTHIAZIDE 12.5 MG PO CAPS
12.5000 mg | ORAL_CAPSULE | Freq: Every day | ORAL | Status: DC
  Administered 2012-05-12 – 2012-05-13 (×2): 12.5 mg via ORAL
  Filled 2012-05-11 (×2): qty 1

## 2012-05-11 NOTE — Progress Notes (Signed)
Second unit of blood started and completed at 1815 with no reaction. Third unit of blood started at 1845 is in progress

## 2012-05-11 NOTE — Progress Notes (Signed)
Third unit of blood ended at 2100. Patient running slight temp of 100.5. No other issues or reaction noted. MD made aware and agreed to give Tylenol and requested respiratory therapist to assess.. Order carried out.

## 2012-05-11 NOTE — Progress Notes (Signed)
First unit of blood transfused at 1325 and completed at 1545 with no reaction.

## 2012-05-11 NOTE — Progress Notes (Signed)
OT Cancellation Note  Treatment cancelled today due to medical issues with patient which prohibited therapy.  Pt with hemoglobin of 5.9. Will re-attempt when appropriate.   05/11/2012 Darrol Jump OTR/L Pager [phone] Office [phone]

## 2012-05-11 NOTE — Progress Notes (Signed)
Patient ID: [name] [name], female   DOB: 04-17-1946, 66 y.o.   MRN: [id number] Hb down to 5.9 to start packed cells

## 2012-05-11 NOTE — Progress Notes (Signed)
Physical Therapy Evaluation Patient Details Name: [name] [name] MRN: [id number] DOB: Aug 18, 1946 Today's Date: 05/11/2012 Time: [id number] PT Time Calculation (min): 25 min  PT Assessment / Plan / Recommendation Clinical Impression  Pt is a 66 y/o female admitted s/p L4-S1 fusion/revision along with the below PT problem list.  Pt would benefit from acute PT to maximize independence and facilitate d/c home without follow-up.    PT Assessment  Patient needs continued PT services    Follow Up Recommendations  No PT follow up    Barriers to Discharge None      lEquipment Recommendations  None recommended by PT    Recommendations for Other Services     Frequency Min 5X/week    Precautions / Restrictions Precautions Precautions: Back Precaution Booklet Issued: Yes (comment) Precaution Comments: Educated pt on 3/3 back precautions. Restrictions Weight Bearing Restrictions: No   Pertinent Vitals/Pain n/a      Mobility  Bed Mobility Bed Mobility: Rolling Left;Left Sidelying to Sit Rolling Left: 4: Min guard;With rail Left Sidelying to Sit: 4: Min guard;With rails;HOB flat Details for Bed Mobility Assistance: Guarding for balance with cues for safe sequence to prortect back. Transfers Transfers: Sit to Stand;Stand to Sit (2 trials.) Sit to Stand: 4: Min assist;With upper extremity assist;From bed Stand to Sit: 4: Min assist;With upper extremity assist;To bed;To chair/3-in-1 Details for Transfer Assistance: Assist for balance with cues for safest hand placement. Ambulation/Gait Ambulation/Gait Assistance: 4: Min assist Ambulation Distance (Feet): 100 Feet Assistive device: Rolling walker Ambulation/Gait Assistance Details: Assist for balance with cues for tall posture and safe sequence inside RW. Gait Pattern: Step-through pattern;Decreased stride length;Trunk flexed Stairs: No Wheelchair Mobility Wheelchair Mobility: No    Exercises     PT Diagnosis: Difficulty  walking  PT Problem List: Decreased strength;Decreased balance;Decreased mobility;Decreased knowledge of use of DME;Decreased activity tolerance;Decreased knowledge of precautions PT Treatment Interventions: DME instruction;Gait training;Stair training;Functional mobility training;Therapeutic activities;Balance training;Patient/family education   PT Goals Acute Rehab PT Goals PT Goal Formulation: With patient Time For Goal Achievement: 05/18/12 Potential to Achieve Goals: Good Pt will Roll Supine to Left Side: with modified independence PT Goal: Rolling Supine to Left Side - Progress: Goal set today Pt will go Supine/Side to Sit: with modified independence PT Goal: Supine/Side to Sit - Progress: Goal set today Pt will go Sit to Supine/Side: with modified independence PT Goal: Sit to Supine/Side - Progress: Goal set today Pt will go Sit to Stand: with modified independence PT Goal: Sit to Stand - Progress: Goal set today Pt will go Stand to Sit: with modified independence PT Goal: Stand to Sit - Progress: Goal set today Pt will Ambulate: >150 feet;with modified independence;with least restrictive assistive device PT Goal: Ambulate - Progress: Goal set today Pt will Go Up / Down Stairs: 1-2 stairs;with modified independence;with least restrictive assistive device PT Goal: Up/Down Stairs - Progress: Goal set today  Visit Information  Last PT Received On: 05/11/12 Assistance Needed: +1    Subjective Data  Subjective: "My throat is dry and scratchy." Patient Stated Goal: Go home.   Prior Functioning  Home Living Lives With: Daughter (Granddaughter) Available Help at Discharge: Family Type of Home: House Home Access: Stairs to enter Technical brewer of Steps: 2 Home Layout: One level Bathroom Shower/Tub: Walk-in shower;Door [employer] Toilet: Standard Home Adaptive Equipment: Walker - rolling;Bedside commode/3-in-1;Wheelchair - manual Prior Function Level of Independence:  Independent Able to Take Stairs?: Yes Driving: Yes Vocation: On disability Communication Communication: No difficulties Dominant Hand: Right  Cognition  Overall Cognitive Status: Appears within functional limits for tasks assessed/performed Arousal/Alertness: Lethargic Orientation Level: Appears intact for tasks assessed Behavior During Session: [facility] for tasks performed    Extremity/Trunk Assessment Right Upper Extremity Assessment RUE ROM/Strength/Tone: Within functional levels RUE Sensation: WFL - Light Touch RUE Coordination: WFL - gross/fine motor Left Upper Extremity Assessment LUE ROM/Strength/Tone: Within functional levels LUE Sensation: WFL - Light Touch LUE Coordination: WFL - gross/fine motor Right Lower Extremity Assessment RLE ROM/Strength/Tone: Within functional levels RLE Sensation: WFL - Light Touch RLE Coordination: WFL - gross/fine motor Left Lower Extremity Assessment LLE ROM/Strength/Tone: Within functional levels LLE Sensation: WFL - Light Touch LLE Coordination: WFL - gross/fine motor Trunk Assessment Trunk Assessment: Normal   Balance Balance Balance Assessed: No  End of Session PT - End of Session Equipment Utilized During Treatment: Gait belt Activity Tolerance: Patient tolerated treatment well Patient left: in chair;with call bell/phone within reach;with nursing in room Nurse Communication: Mobility status   Cyndia Bent 05/11/2012, 8:52 AM  05/11/2012 Cyndia Bent, PT, DPT [phone]

## 2012-05-11 NOTE — Progress Notes (Signed)
Patient's foley cath removed at this time. Tolerated well.

## 2012-05-11 NOTE — Op Note (Signed)
NAME:  [name] [name], [name] [name] NO.:  [id number]  MEDICAL RECORD NO.:  [id number]  LOCATION:  [facility]                         FACILITY:  [facility]  PHYSICIAN:  Leeroy Cha, M.D.   DATE OF BIRTH:  May 01, 1946  DATE OF PROCEDURE:  05/10/2012 DATE OF DISCHARGE:                              OPERATIVE REPORT   PREOPERATIVE DIAGNOSES: 1. Status post L5-S1 fusion secondary to grade 2 spondylolisthesis. 2. Degenerative disk disease at L4-L5 with bilateral herniated disk     and foraminal stenosis.  POSTOPERATIVE DIAGNOSES: 1. Status post L5-S1 fusion secondary to grade 2 spondylolisthesis. 2. Degenerative disk disease at L4-L5 with bilateral herniated disk     and foraminal stenosis.  PROCEDURES:  Revision of the previous L5-S1 fusion with removal of pedicle screw of L5-S1, bilateral L4 laminectomy, bilateral facetectomy, bilateral L4-L5 diskectomy more than normally we do for any herniated disk.  We will be able to remove the whole disk medial and laterally.  Insertion of cages at the level of L4-L5, insertion of new pedicle screws at the level of L4, re-insertion of new pedicles screws__________ at the level of L5-S1.  Posterolateral arthrodesis with autograft and Osteocel, Cell saver, c-arm SURGEON:  Leeroy Cha, MD  ASSISTANT:  Otilio Connors, MD  CLINICAL HISTORY:  Ms. Kiecker is a lady who 4 years ago underwent L5- S1 fusion secondary to grade 2 spondylolisthesis.  The patient did really well and later she had been complaining of bilateral leg pains Associated with _________ with weakness dorsiflexion, right versus the left one.  She has failed with conservative treatment including chiropractor, epidural injection, medication.  X-ray showed herniated disk at L4-L5, foraminal stenosis, and degenerative disk disease.  Surgery was advised.  The patient knew the risk with the surgery as well as at the L4.  PROCEDURE:  The patient was taken to the OR, and after  intubation, she was positioned in a prone manner.  The back was cleaned with DuraPrep and drapes were applied.  Midline incision following the previous one was made at the top of L4-5 to L5-S1. Then, we went __________ laterally to the facets joints.  Then, we were able to visualize the pedicle screws of L5-S1 from the previous surgery __________ .  Then, using the C-arm ___with_______ AP view and lateral view, we did our bilateral diskectomy of the L4-L5.  Prior to that, the spinous process, lamina, and the facet of L4 was removed.  The diskectomy was accomplished lateral and medial __________ to decompress the L4 level. The area of calcified disk was in the midline, and we were able to remove the disk completely.  Then, we used a curette to remove the endplate of L4 and L5, and we were able to insert 2 cages of 10 x 22 with autograft and osteocell inside __________.  Medially and laterally, a mix of autograft and Osteocel was used to fill up the disk spaces.   Then, using the C-arm, we drilled the pedicles__________ of L4.    Then, two pedicle screws of 5.5 by 40 mms__________ at the level of L5 were used.  Then, _we continue_________ to connect the pedical  screws _with a rod and capps to secure  the on [place _________.  We went laterally to the facets__of________ L4, L5, and S1 and a mix of autograft and Osteocel _for a posterolateral arthrodesis_was done________.  Although we had good hemosthasis__________ good hemostasis, we left a drain in the epidural space.  __then________ the wound was closed with Vicryl and Steri-Strips.  The patient was going to go to PACU.          ______________________________ Leeroy Cha, M.D.     EB/MEDQ  D:  05/10/2012  T:  05/10/2012  Job:  [id number]

## 2012-05-11 NOTE — Progress Notes (Signed)
Patient ID: [name] [name], female   DOB: [birth year], 66 y.o.   MRN: [id number] Stable, was oob with pt. C/o incisional pain , no weakness. Eating.

## 2012-05-12 LAB — TYPE AND SCREEN
Antibody Screen: NEGATIVE
Unit division: 0
Unit division: 0

## 2012-05-12 MED FILL — Sodium Chloride Irrigation Soln 0.9%: Qty: 3000 | Status: AC

## 2012-05-12 MED FILL — Sodium Chloride IV Soln 0.9%: INTRAVENOUS | Qty: 1000 | Status: AC

## 2012-05-12 MED FILL — Heparin Sodium (Porcine) Inj 1000 Unit/ML: INTRAMUSCULAR | Qty: 30 | Status: AC

## 2012-05-12 NOTE — Clinical Documentation Improvement (Signed)
Anemia Blood Loss Clarification  THIS DOCUMENT IS NOT A PERMANENT PART OF THE MEDICAL RECORD         05/12/12  Dear [name] [name]  In an effort to better capture your patient's severity of illness, reflect appropriate length of stay and utilization of resources, a review of the patient medical record has revealed the following indicators.   Based on your clinical judgment, please clarify and document in a progress note and/or discharge summary the clinical condition associated with the following supporting information: In responding to this query please exercise your independent judgment.  The fact that a query is asked, does not imply that any particular answer is desired or expected.  Hi Dr. Joya Salm!  "Hb down to 5.9 to start packed cells" was documented on 5/29. Please consider the below (if your clinical findings/judgment agree) as you document the patient's diagnosis/condition(s) in the progress note and discharge summary. Thank you!  Possible Clinical Conditions?  - Acute blood loss anemia  - Expected intraoperative acute blood loss anemia  - Other condition (please document in the progress notes and/or discharge summary)  - Cannot Clinically determine at this time   Supporting Information:  - Component Hemoglobin HCT  Latest Ref Rng 12.0 - 15.0 g/dL 36.0 - 46.0 %  05/06/2012 14.7 42.1  05/11/2012 5.9 (LL) 17.5 (L)  05/12/2012 10.8 (L) 30.7 (L)   - Transfuse 2 units PRBCs 5/29  - EBL=275mls  - Lumbar Fusion 5/28      Reviewed: 05/24/12  Thank Dennis Bast,  [facility] Documentation Specialist: [phone] Pager  Health Information Management [place]  Acute anemia post op

## 2012-05-12 NOTE — Progress Notes (Signed)
Patient ID: [name] [name], female   DOB: [birth year], 66 y.o.   MRN: [id number] Better, more energy. Still incisional pain. Decrease of drainage. No weakness. Increase of hemoglobin . Continue pt/ot

## 2012-05-12 NOTE — Progress Notes (Signed)
Physical Therapy Treatment Patient Details Name: [name] [name] MRN: [id number] DOB: [birth year] Today's Date: 05/12/2012 Time: [id number] PT Time Calculation (min): 15 min  PT Assessment / Plan / Recommendation Comments on Treatment Session  Patient progressing well with ambulation however slightly drowsy today and requiring safety cues    Follow Up Recommendations  No PT follow up    Barriers to Discharge        Equipment Recommendations  None recommended by PT    Recommendations for Other Services    Frequency Min 5X/week   Plan Discharge plan remains appropriate    Precautions / Restrictions Precautions Precautions: Back Precaution Comments: Patient able to recall 2/3 back precautions. Needed cues to recall no twisting   Pertinent Vitals/Pain 2/10 back pain    Mobility  Bed Mobility Bed Mobility: Sit to Supine Rolling Left: 4: Min guard Left Sidelying to Sit: 4: Min guard Sit to Supine: 4: Min assist Details for Bed Mobility Assistance: A to prevent no twisting back to bed. Patient required cues for log roll and sequency Transfers Sit to Stand: 4: Min guard;With upper extremity assist;From bed Stand to Sit: 4: Min guard;With upper extremity assist;To bed Details for Transfer Assistance: Cues for safe hand placement Ambulation/Gait Ambulation/Gait Assistance: 4: Min assist Ambulation Distance (Feet): 180 Feet Assistive device: Rolling walker Ambulation/Gait Assistance Details: A for balance and safety with RW Gait Pattern: Step-through pattern;Decreased stride length    Exercises     PT Diagnosis:    PT Problem List:   PT Treatment Interventions:     PT Goals Acute Rehab PT Goals PT Goal: Rolling Supine to Left Side - Progress: Progressing toward goal PT Goal: Supine/Side to Sit - Progress: Progressing toward goal PT Goal: Sit to Supine/Side - Progress: Progressing toward goal PT Goal: Sit to Stand - Progress: Progressing toward goal PT Goal: Stand to Sit  - Progress: Progressing toward goal PT Goal: Ambulate - Progress: Progressing toward goal  Visit Information  Last PT Received On: 05/12/12 Assistance Needed: +1    Subjective Data      Cognition  Overall Cognitive Status: Appears within functional limits for tasks assessed/performed Arousal/Alertness: Awake/alert Orientation Level: Appears intact for tasks assessed Behavior During Session: [facility] for tasks performed    Balance     End of Session PT - End of Session Equipment Utilized During Treatment: Gait belt Activity Tolerance: Patient tolerated treatment well Patient left: in chair Nurse Communication: Mobility status    Jacqualyn Posey 05/12/2012, 12:14 PM 05/12/2012 Jacqualyn Posey PTA [phone] pager [phone] office

## 2012-05-12 NOTE — Progress Notes (Signed)
Hemoglobin up to 8.7 resting

## 2012-05-12 NOTE — Progress Notes (Signed)
Occupational Therapy Evaluation Patient Details Name: [name] [name] MRN: [id number] DOB: 03-05-1946 Today's Date: 05/12/2012 Time: [id number] OT Time Calculation (min): 18 min  OT Assessment / Plan / Recommendation Clinical Impression  Pt s/p L4-S1 fusion/revision thus affecting PLOF. Will benefit from acute OT to address below problem list in prep for d/c home with family.    OT Assessment  Patient needs continued OT Services    Follow Up Recommendations  Supervision/Assistance - 24 hour    Barriers to Discharge      Equipment Recommendations  None recommended by OT    Recommendations for Other Services    Frequency  Min 2X/week    Precautions / Restrictions Precautions Precautions: Back Precaution Comments: Pt able to recall 2/3 back precautions (verbal cue to remember twisting)   Pertinent Vitals/Pain See vitals    ADL  Eating/Feeding: Performed;Modified independent Where Assessed - Eating/Feeding: Edge of bed Grooming: Performed;Wash/dry face;Teeth care;Set up Where Assessed - Grooming: Unsupported sitting Lower Body Bathing: Simulated;Minimal assistance Where Assessed - Lower Body Bathing: Supported sit to stand Lower Body Dressing: Simulated;Minimal assistance Where Assessed - Lower Body Dressing: Sopported sit to stand Toilet Transfer: Simulated;Minimal assistance Toilet Transfer Method:  (ambulating) Science writer:  (bed) Equipment Used: Gait belt;Rolling walker Transfers/Ambulation Related to ADLs: Min assist with RW and verbal cueing ADL Comments: Educated pt on techniques and strategies to adhere to back safety precautions during ADLs. Pt able raise leg to perform LB dressing/bathing tasks but with difficulty.  Unable to cross ankles over knees due to pain.    OT Diagnosis: Acute pain  OT Problem List: Decreased activity tolerance;Decreased knowledge of use of DME or AE;Decreased knowledge of precautions;Pain OT Treatment Interventions:  Self-care/ADL training;DME and/or AE instruction;Therapeutic activities;Patient/family education   OT Goals Acute Rehab OT Goals OT Goal Formulation: With patient Time For Goal Achievement: 05/19/12 Potential to Achieve Goals: Good ADL Goals Pt Will Perform Lower Body Bathing: with set-up;Sit to stand from chair;Sit to stand from bed;with adaptive equipment ADL Goal: Lower Body Bathing - Progress: Goal set today Pt Will Perform Lower Body Dressing: with set-up;Sit to stand from chair;Sit to stand from bed;with adaptive equipment ADL Goal: Lower Body Dressing - Progress: Goal set today Pt Will Perform Tub/Shower Transfer: Shower transfer;with supervision;Shower seat with back;with DME;Ambulation;Maintaining back safety precautions ADL Goal: Tub/Shower Transfer - Progress: Goal set today Additional ADL Goal #1: Pt will perform 3/3 toileting tasks with supervision, ADL Goal: Additional Goal #1 - Progress: Goal set today Miscellaneous OT Goals Miscellaneous OT Goal #1: Pt will perform bed mobility with supervision in prep for EOB ADLs. OT Goal: Miscellaneous Goal #1 - Progress: Goal set today  Visit Information  Last OT Received On: 05/12/12 Assistance Needed: +1    Subjective Data      Prior Functioning  Home Living Lives With: Daughter Advertising account executive) Available Help at Discharge: Family Type of Home: House Home Access: Stairs to enter Technical brewer of Steps: 2 Home Layout: One level Bathroom Shower/Tub: Walk-in shower;Door (with built in Astronomer) Biochemist, clinical: Standard Home Adaptive Equipment: Walker - rolling;Bedside commode/3-in-1;Wheelchair - manual Prior Function Level of Independence: Independent Able to Take Stairs?: Yes Driving: Yes Vocation: On disability Communication Communication: No difficulties Dominant Hand: Right    Cognition  Overall Cognitive Status: Appears within functional limits for tasks assessed/performed Arousal/Alertness:  Lethargic Orientation Level: Appears intact for tasks assessed Behavior During Session: Lethargic    Extremity/Trunk Assessment Right Upper Extremity Assessment RUE ROM/Strength/Tone: Within functional levels Left Upper Extremity Assessment  LUE ROM/Strength/Tone: Within functional levels   Mobility Bed Mobility Bed Mobility: Rolling Left;Left Sidelying to Sit;Sitting - Scoot to Edge of Bed;Sit to Sidelying Left Rolling Left: 4: Min guard;With rail Left Sidelying to Sit: 4: Min guard;HOB flat;With rails Sitting - Scoot to Edge of Bed: 5: Supervision Sit to Sidelying Left: 4: Min guard;HOB flat Details for Bed Mobility Assistance: VC for technique.  Tactile cues to bend knees before rolling.  Min guard to prevent twisting. Transfers Transfers: Sit to Stand;Stand to Sit Sit to Stand: 4: Min assist;From bed;With upper extremity assist Stand to Sit: 4: Min guard;To bed;With upper extremity assist Details for Transfer Assistance: verbal cueing for safe hand placement   Exercise    Balance    End of Session OT - End of Session Equipment Utilized During Treatment: Gait belt Activity Tolerance: Patient limited by fatigue Patient left: in bed;with call bell/phone within reach Nurse Communication: Mobility status  05/12/2012 Darrol Jump OTR/L Pager [phone] Office [phone]  Darrol Jump 05/12/2012, 4:44 PM

## 2012-05-12 NOTE — Clinical Social Work Note (Signed)
CSW received consult for SNF. PT is not recommending follow up. Discussed pt in progression with RN. RNCM is aware and following. CSW is signing off as no further needs identified. Please reconsult if a need arises prior to discharge.   Darden Dates, MSW, [place]

## 2012-05-13 LAB — CBC
MCH: 29.7 pg (ref 26.0–34.0)
MCHC: 35.2 g/dL (ref 30.0–36.0)
Platelets: 135 10*3/uL — ABNORMAL LOW (ref 150–400)
RBC: 3.64 MIL/uL — ABNORMAL LOW (ref 3.87–5.11)
RDW: 15.1 % (ref 11.5–15.5)

## 2012-05-13 LAB — GLUCOSE, CAPILLARY: Glucose-Capillary: 157 mg/dL — ABNORMAL HIGH (ref 70–99)

## 2012-05-13 MED ORDER — DOCUSATE SODIUM 50 MG/5ML PO LIQD: 100.0000 mg | Freq: Two times a day (BID) | ORAL | Status: DC

## 2012-05-13 MED ORDER — DOCUSATE SODIUM 100 MG PO CAPS
100.0000 mg | ORAL_CAPSULE | Freq: Every day | ORAL | Status: DC
Start: 2012-05-13 — End: 2012-05-13
  Filled 2012-05-13: qty 1

## 2012-05-13 NOTE — Progress Notes (Signed)
Patient ID: [name] [name], female   DOB: 03/10/1946, 66 y.o.   MRN: [id number] Drain removede.decrease of pain. Dc this pm.

## 2012-05-13 NOTE — Progress Notes (Signed)
Physical Therapy Treatment Patient Details Name: DEAHNA MINOTTI MRN: [id number] DOB: 1946/11/10 Today's Date: 05/13/2012 Time: [id number] PT Time Calculation (min): 18 min  PT Assessment / Plan / Recommendation Comments on Treatment Session  pt presents s/p L4-S1 Fusion.  pt making great progress.  More alert today and participating well.      Follow Up Recommendations  No PT follow up    Barriers to Discharge        Equipment Recommendations  None recommended by PT    Recommendations for Other Services    Frequency Min 5X/week   Plan Discharge plan remains appropriate;Frequency remains appropriate    Precautions / Restrictions Precautions Precautions: Back Precaution Comments: Verbalized 2/3 back precautions.  Reviewed precautions.   Restrictions Weight Bearing Restrictions: No   Pertinent Vitals/Pain 2/10 Back    Mobility  Bed Mobility Bed Mobility: Sit to Sidelying Left Sit to Sidelying Left: 5: Supervision Details for Bed Mobility Assistance: demos goo technique Transfers Transfers: Sit to Stand;Stand to Sit Sit to Stand: 5: Supervision;With upper extremity assist;With armrests;From chair/3-in-1 Stand to Sit: 5: Supervision;With upper extremity assist;To bed Details for Transfer Assistance: demos good safety Ambulation/Gait Ambulation/Gait Assistance: 5: Supervision Ambulation Distance (Feet): 300 Feet Assistive device: Rolling walker Ambulation/Gait Assistance Details: cues to stay closer to RW, upright poture, back precautions during ambulation as pt tends to twist when talking to others in hall.   Gait Pattern: Step-through pattern;Decreased stride length Stairs: No Wheelchair Mobility Wheelchair Mobility: No    Exercises     PT Diagnosis:    PT Problem List:   PT Treatment Interventions:     PT Goals Acute Rehab PT Goals Time For Goal Achievement: 05/18/12 PT Goal: Sit to Supine/Side - Progress: Partly met PT Goal: Sit to Stand - Progress:  Progressing toward goal PT Goal: Stand to Sit - Progress: Progressing toward goal PT Goal: Ambulate - Progress: Progressing toward goal  Visit Information  Last PT Received On: 05/13/12 Assistance Needed: +1    Subjective Data  Subjective: I'm not feeling bad at all this morning.     Cognition  Overall Cognitive Status: Appears within functional limits for tasks assessed/performed Arousal/Alertness: Awake/alert Orientation Level: Oriented X4 / Intact Behavior During Session: [facility] for tasks performed    Balance  Balance Balance Assessed: No  End of Session PT - End of Session Equipment Utilized During Treatment: Gait belt Activity Tolerance: Patient tolerated treatment well Patient left: in bed;with call bell/phone within reach Nurse Communication: Mobility status    Catarina Hartshorn, [place] 05/13/2012, 10:02 AM

## 2012-05-13 NOTE — Progress Notes (Signed)
Occupational Therapy Treatment Patient Details Name: [name] [name] MRN: [id number] DOB: 02/09/1946 Today's Date: 05/13/2012 Time: [id number] OT Time Calculation (min): 26 min  OT Assessment / Plan / Recommendation Comments on Treatment Session Pt. is progressing well with ADLs.  Requires occasional cue for back precautions.  Handout provided.  Pt. reports she will have 24 hour assist at home if needed.  Currently she requires supervision for ADLs    Follow Up Recommendations  Supervision/Assistance - 24 hour    Barriers to Discharge       Equipment Recommendations  None recommended by OT    Recommendations for Other Services    Frequency Min 2X/week   Plan Discharge plan remains appropriate    Precautions / Restrictions Precautions Precautions: Back Precaution Booklet Issued: Yes (comment) Precaution Comments: verbalized 3/3 precautions with a delay.  Pt. provided with Back handout again per her request.  Pt. requires min cues to avoid twisting during ADL activities Restrictions Weight Bearing Restrictions: No   Pertinent Vitals/Pain     ADL  Lower Body Bathing: Supervision/safety;Simulated Where Assessed - Lower Body Bathing: Unsupported sit to stand Lower Body Dressing: Performed;Supervision/safety Where Assessed - Lower Body Dressing: Unsupported sit to stand Toilet Transfer: Performed;Supervision/safety Toilet Transfer Method: Sit to stand (ambulated to BR) Science writer: Comfort height toilet Toileting - Clothing Manipulation and Hygiene: Performed;Supervision/safety Where Assessed - Best boy and Hygiene: Standing Tub/Shower Transfer: Armed forces operational officer Method: Therapist, art: Grab bars Equipment Used: Rolling walker Transfers/Ambulation Related to ADLs: supervision with RW ADL Comments: Pt. able to cross ankles over knees to access feet for LB ADLs.  Discussed use of LH  sponge for bathing feet - she verbalized understanding and reports she has this at home.  Also instructed pt. in use of reacher and she has at home.  Pt. instructed to sit in straight back chair, avoid recliner, and change position hourly at discharge.    OT Diagnosis:    OT Problem List:   OT Treatment Interventions:     OT Goals ADL Goals ADL Goal: Lower Body Bathing - Progress: Progressing toward goals ADL Goal: Lower Body Dressing - Progress: Progressing toward goals ADL Goal: Tub/Shower Transfer - Progress: Met ADL Goal: Additional Goal #1 - Progress: Met Miscellaneous OT Goals OT Goal: Miscellaneous Goal #1 - Progress: Met  Visit Information  Last OT Received On: 05/13/12 Assistance Needed: +1    Subjective Data      Prior Functioning       Cognition  Overall Cognitive Status: Appears within functional limits for tasks assessed/performed Arousal/Alertness: Awake/alert Orientation Level: Oriented X4 / Intact Behavior During Session: [facility] for tasks performed    Mobility Bed Mobility Bed Mobility: Rolling Left;Left Sidelying to Sit;Sitting - Scoot to Edge of Bed;Sit to Sidelying Left Rolling Left: 5: Supervision Left Sidelying to Sit: 5: Supervision;HOB flat Sitting - Scoot to Edge of Bed: 5: Supervision Sit to Sidelying Left: 5: Supervision;HOB flat Details for Bed Mobility Assistance: demos goo technique Transfers Transfers: Sit to Stand;Stand to Sit Sit to Stand: 5: Supervision;With upper extremity assist;With armrests;From chair/3-in-1 Stand to Sit: 5: Supervision;With upper extremity assist;To bed Details for Transfer Assistance: demos good safety   Exercises    Balance Balance Balance Assessed: No  End of Session OT - End of Session Activity Tolerance: Patient tolerated treatment well Patient left: in bed;with call bell/phone within reach Nurse Communication: Other (comment) (incision draining)   Saphia Vanderford, Ellard Artis M 05/13/2012, 11:47 AM

## 2012-05-13 NOTE — Discharge Summary (Addendum)
Physician Discharge Summary  Patient ID: [name] [name] MRN: [id number] DOB/AGE: [birth year] 67 y.o.  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  Discharge Diagnoses: same PLUS acute postopretive anemia   Discharged Condition:stable. Minimal incision pain. Small drainage  Hospital Course:lumbar fusion  Consults: none  Significant Diagnostic Studies: myelogram  Treatments: lumbar fusion. Got 3 units of blood cells  Discharge Exam: Blood pressure 131/65, pulse 87, temperature 98.4 F (36.9 C), temperature source Oral, resp. rate 18, height 4\' 9"  (1.448 m), weight 60.419 kg (133 lb 3.2 oz), SpO2 96.00%. No leg pain. Minimal draibage Disposition: 01-Home or Self Care family to call for a f/u visit. lso instructed about dressing change Medication List  As of 05/13/2012  5:33 PM   STOP taking these medications         acetaminophen 650 MG CR tablet      enalapril 20 MG tablet      timolol 0.5 % ophthalmic solution         TAKE these medications         amLODipine 10 MG tablet   Commonly known as: NORVASC   Take 10 mg by mouth daily.      aspirin EC 81 MG tablet   Take 81 mg by mouth daily.      B-complex with vitamin C tablet   Take 1 tablet by mouth daily.      CALCIUM PO   Take 1 tablet by mouth every evening.      gabapentin 100 MG capsule   Commonly known as: NEURONTIN   Take 100 mg by mouth 2 (two) times daily.      glipiZIDE 5 MG 24 hr tablet   Commonly known as: GLUCOTROL XL   Take 5 mg by mouth daily.      glucosamine-chondroitin 500-400 MG tablet   Take 1 tablet by mouth daily.      hydrochlorothiazide 25 MG tablet   Commonly known as: HYDRODIURIL   Take 12.5 mg by mouth daily.      HYDROcodone-acetaminophen 5-500 MG per tablet   Commonly known as: VICODIN   Take 1 tablet by mouth every 6 (six) hours as needed. For pain      latanoprost 0.005 % ophthalmic solution   Commonly known as: XALATAN   Place 1 drop into both eyes at bedtime.      levothyroxine 88 MCG tablet   Commonly known as: SYNTHROID, LEVOTHROID   Take 88 mcg by mouth daily.      MELATONIN PO   Take 2 tablets by mouth at bedtime.      meloxicam 15 MG tablet   Commonly known as: MOBIC   Take 15 mg by mouth daily.      metFORMIN 500 MG tablet   Commonly known as: GLUCOPHAGE   Take 500 mg by mouth 2 (two) times daily with a meal.      omega-3 acid ethyl esters 1 G capsule   Commonly known as: LOVAZA   Take 2 g by mouth daily.      potassium chloride SA 20 MEQ tablet   Commonly known as: K-DUR,KLOR-CON   Take 20 mEq by mouth daily.      prenatal multivitamin Tabs   Take 1 tablet by mouth daily.      simvastatin 20 MG tablet   Commonly known as: ZOCOR   Take 20 mg by mouth at bedtime.      sodium chloride 0.65 %  nasal spray   Commonly known as: OCEAN   Place 1 spray into the nose as needed.      SUDAFED 30 MG tablet   Generic drug: pseudoephedrine   Take 30 mg by mouth as needed. Patient unsure dose      VITAMIN C PO   Take 1 tablet by mouth daily.      VITAMIN D (CHOLECALCIFEROL) PO   Take 1 tablet by mouth daily.      VITAMIN E PO   Take 1 tablet by mouth daily.             Signed: Floyce Stakes 05/13/2012, 5:33 PM

## 2012-05-13 NOTE — Progress Notes (Signed)
Pt dressing was saturated with serosanguineous,  Dressing was removed and another was reapplied, with 4x4 gauze

## 2012-05-13 NOTE — Progress Notes (Signed)
Patient had some scant serosanguineous drainage from incision, 4X4 were applied to form a dressing.  Will monitor patient

## 2012-05-18 NOTE — Progress Notes (Signed)
Patient ID: [name] [name], female   DOB: Aug 27, 1946, 66 y.o.   MRN: [id number] PATIENT HAD AN ACUTE ANEMIA SECONDARY TO SURGICAL L BLOOD LOSS SECONDARY TO INTAKE OF NSAID . HER HEMOGLOBIN WENT DOWN TO 5.9  THREE UNITS OF BLOOD WERE GIVEN

## 2012-06-06 DIAGNOSIS — M545 Low back pain, unspecified: Secondary | ICD-10-CM | POA: Diagnosis not present

## 2012-06-13 ENCOUNTER — Encounter: Payer: Self-pay | Admitting: Internal Medicine

## 2012-06-21 DIAGNOSIS — M545 Low back pain, unspecified: Secondary | ICD-10-CM | POA: Diagnosis not present

## 2012-06-21 DIAGNOSIS — R262 Difficulty in walking, not elsewhere classified: Secondary | ICD-10-CM | POA: Diagnosis not present

## 2012-06-23 DIAGNOSIS — M545 Low back pain, unspecified: Secondary | ICD-10-CM | POA: Diagnosis not present

## 2012-06-23 DIAGNOSIS — R262 Difficulty in walking, not elsewhere classified: Secondary | ICD-10-CM | POA: Diagnosis not present

## 2012-07-07 DIAGNOSIS — R262 Difficulty in walking, not elsewhere classified: Secondary | ICD-10-CM | POA: Diagnosis not present

## 2012-07-07 DIAGNOSIS — M545 Low back pain, unspecified: Secondary | ICD-10-CM | POA: Diagnosis not present

## 2012-07-08 DIAGNOSIS — M545 Low back pain, unspecified: Secondary | ICD-10-CM | POA: Diagnosis not present

## 2012-07-08 DIAGNOSIS — R262 Difficulty in walking, not elsewhere classified: Secondary | ICD-10-CM | POA: Diagnosis not present

## 2012-07-11 DIAGNOSIS — M545 Low back pain, unspecified: Secondary | ICD-10-CM | POA: Diagnosis not present

## 2012-07-13 DIAGNOSIS — M545 Low back pain, unspecified: Secondary | ICD-10-CM | POA: Diagnosis not present

## 2012-07-13 DIAGNOSIS — R262 Difficulty in walking, not elsewhere classified: Secondary | ICD-10-CM | POA: Diagnosis not present

## 2012-07-14 DIAGNOSIS — M545 Low back pain, unspecified: Secondary | ICD-10-CM | POA: Diagnosis not present

## 2012-07-14 DIAGNOSIS — R262 Difficulty in walking, not elsewhere classified: Secondary | ICD-10-CM | POA: Diagnosis not present

## 2012-07-22 DIAGNOSIS — R262 Difficulty in walking, not elsewhere classified: Secondary | ICD-10-CM | POA: Diagnosis not present

## 2012-07-22 DIAGNOSIS — M545 Low back pain, unspecified: Secondary | ICD-10-CM | POA: Diagnosis not present

## 2012-07-27 DIAGNOSIS — M545 Low back pain, unspecified: Secondary | ICD-10-CM | POA: Diagnosis not present

## 2012-07-27 DIAGNOSIS — R262 Difficulty in walking, not elsewhere classified: Secondary | ICD-10-CM | POA: Diagnosis not present

## 2012-07-28 DIAGNOSIS — M545 Low back pain, unspecified: Secondary | ICD-10-CM | POA: Diagnosis not present

## 2012-07-28 DIAGNOSIS — R262 Difficulty in walking, not elsewhere classified: Secondary | ICD-10-CM | POA: Diagnosis not present

## 2012-07-29 DIAGNOSIS — M545 Low back pain, unspecified: Secondary | ICD-10-CM | POA: Diagnosis not present

## 2012-07-29 DIAGNOSIS — R262 Difficulty in walking, not elsewhere classified: Secondary | ICD-10-CM | POA: Diagnosis not present

## 2012-08-11 ENCOUNTER — Encounter: Payer: Self-pay | Admitting: Internal Medicine

## 2012-08-16 ENCOUNTER — Encounter: Payer: Self-pay | Admitting: Gastroenterology

## 2012-08-17 ENCOUNTER — Ambulatory Visit (INDEPENDENT_AMBULATORY_CARE_PROVIDER_SITE_OTHER): Payer: Medicare Other | Admitting: Gastroenterology

## 2012-08-17 ENCOUNTER — Encounter: Payer: Self-pay | Admitting: Gastroenterology

## 2012-08-17 VITALS — BP 162/82 | HR 88 | Temp 98.4°F | Ht <= 58 in | Wt 135.0 lb

## 2012-08-17 DIAGNOSIS — L821 Other seborrheic keratosis: Secondary | ICD-10-CM | POA: Diagnosis not present

## 2012-08-17 DIAGNOSIS — D235 Other benign neoplasm of skin of trunk: Secondary | ICD-10-CM | POA: Diagnosis not present

## 2012-08-17 DIAGNOSIS — R110 Nausea: Secondary | ICD-10-CM | POA: Diagnosis not present

## 2012-08-17 DIAGNOSIS — Z86010 Personal history of colonic polyps: Secondary | ICD-10-CM

## 2012-08-17 DIAGNOSIS — Z800 Family history of malignant neoplasm of digestive organs: Secondary | ICD-10-CM

## 2012-08-17 DIAGNOSIS — Z85820 Personal history of malignant melanoma of skin: Secondary | ICD-10-CM | POA: Diagnosis not present

## 2012-08-17 MED ORDER — PEG 3350-KCL-NA BICARB-NACL 420 G PO SOLR: 4000.0000 mL | ORAL | Status: AC

## 2012-08-17 NOTE — Progress Notes (Signed)
Primary Care Physician:  Glo Herring., MD  Primary Gastroenterologist:  Garfield Cornea, MD   Chief Complaint  Patient presents with  . Colonoscopy  . Emesis    HPI:  [name] [name] is a 66 y.o. female here to schedule surveillance colonoscopy. Family h/o colon cancer, brother. Personal h/o adenomatous colon polyp in 2008.  04/2012 back surgery. Complicated by postop anemia requiring 3 units of prbcs. She had a normal hemoglobin prior surgery of 14.7 (05/06/2012). Postoperatively hemoglobin was 5.9. Hemoglobin 10.8 after 3 units of packed red blood cells.  Patient complains of frequent vomiting for the past 10 months. Vomiting early a.m., 2-3 times per week. Usually before breakfast. Denies chronic nausea. Has to take food with medications or will have vomiting. No heartburn. Started eating Activia again, helped some. Takes fiber pill every night, usually has daily BM. Last night, diarrhea. No melena, brbpr. Occasional right upper quadrant pain, worse with constipation. Pain unrelated to meals. Denies any fever or chills.       Current Outpatient Prescriptions  Medication Sig Dispense Refill  . amLODipine (NORVASC) 10 MG tablet Take 10 mg by mouth daily.        . Ascorbic Acid (VITAMIN C PO) Take 1 tablet by mouth daily.       Marland Kitchen aspirin EC 81 MG tablet Take 81 mg by mouth daily.        . B Complex-C (B-COMPLEX WITH VITAMIN C) tablet Take 1 tablet by mouth daily.        Marland Kitchen CALCIUM PO Take 1 tablet by mouth every evening.        . enalapril (VASOTEC) 20 MG tablet Take 20 mg by mouth daily.      Marland Kitchen glipiZIDE (GLUCOTROL XL) 5 MG 24 hr tablet Take 5 mg by mouth daily.        . hydrochlorothiazide 25 MG tablet Take 12.5 mg by mouth daily.       Marland Kitchen HYDROcodone-acetaminophen (VICODIN) 5-500 MG per tablet Take 1 tablet by mouth every 6 (six) hours as needed. For pain       . latanoprost (XALATAN) 0.005 % ophthalmic solution Place 1 drop into both eyes at bedtime.        Marland Kitchen levothyroxine  (SYNTHROID, LEVOTHROID) 88 MCG tablet Take 88 mcg by mouth daily.        . meloxicam (MOBIC) 15 MG tablet Take 15 mg by mouth daily.        . metFORMIN (GLUCOPHAGE) 500 MG tablet Take 500 mg by mouth 2 (two) times daily with a meal.        . Nutritional Supplements (MELATONIN PO) Take 2 tablets by mouth at bedtime.      Marland Kitchen omega-3 acid ethyl esters (LOVAZA) 1 G capsule Take 2 g by mouth daily.        . potassium chloride SA (K-DUR,KLOR-CON) 20 MEQ tablet Take 20 mEq by mouth daily.       . Prenatal Vit-Fe Fumarate-FA (PRENATAL MULTIVITAMIN) TABS Take 1 tablet by mouth daily.      . simvastatin (ZOCOR) 20 MG tablet Take 20 mg by mouth at bedtime.        . sodium chloride (OCEAN) 0.65 % nasal spray Place 1 spray into the nose as needed.      Marland Kitchen VITAMIN D, CHOLECALCIFEROL, PO Take 1 tablet by mouth daily.        Marland Kitchen VITAMIN E PO Take 1 tablet by mouth daily.  Allergies as of 08/17/2012  . (No Known Allergies)    Past Medical History  Diagnosis Date  . Diabetes mellitus   . Hypertension   . Thyroid disease   . Arthritis   . Hypothyroidism   . Diverticulitis   . Glaucoma   . Hyperlipidemia   . Rheumatoid arthritis   . Osteoarthritis   . Heart murmur   . Aortic stenosis   . Melanoma     left thigh, 2010    Past Surgical History  Procedure Date  . Foot surgery 1994    left   . Abdominal hysterectomy 1988  . Cholecystectomy 1974  . Back surgery 07/2008    fusion  . Appendectomy   . Colonoscopy 06/06/2007    Proximal diminutive rectal polyps cold biopsied/removed, otherwise normal rectum/ Left-sided diverticula, the remainder of the colonic mucosa appeared normal. Path-tubular adnemoa.   . Back surgery [id number]    complicated by post-op anemia requiring transfusion  . Melanoma excision 2010    Family History  Problem Relation Age of Onset  . Anesthesia problems Neg Hx   . Colon cancer Brother     diagnosed at age 38    History   Social History  . Marital Status:  Widowed    Spouse Name: N/A    Number of Children: N/A  . Years of Education: N/A   Occupational History  . Not on file.   Social History Main Topics  . Smoking status: Never Smoker   . Smokeless tobacco: Not on file  . Alcohol Use: Yes     "Wine a couple times a month"  . Drug Use: No  . Sexually Active:    Other Topics Concern  . Not on file   Social History Narrative   Husband deceased 04/18/2011.       ROS:  General: Negative for anorexia, weight loss, fever, chills, fatigue, weakness. Eyes: Negative for vision changes.  ENT: Negative for hoarseness, difficulty swallowing , nasal congestion. CV: Negative for chest pain, angina, palpitations, dyspnea on exertion, peripheral edema.  Respiratory: Negative for dyspnea at rest, dyspnea on exertion, cough, sputum, wheezing.  GI: See history of present illness. GU:  Negative for dysuria, hematuria, urinary incontinence. Complains of chronic urinary frequency, nocturnal urination, feels as if bladder is not emptying. History of needing bladder stem stretched before.  MS: complains of hip pain, low back pain.  Derm: Negative for rash or itching.  Neuro: Negative for weakness, abnormal sensation, seizure, frequent headaches, memory loss, confusion.  Psych: Negative for anxiety, suicidal ideation, hallucinations. Depression since loss of spouse last year. Endo: Negative for unusual weight change.  Heme: Negative for bruising or bleeding. Allergy: Negative for rash or hives.    Physical Examination:  BP 162/82  Pulse 88  Temp 98.4 F (36.9 C) (Temporal)  Ht 4\' 9"  (1.448 m)  Wt 135 lb (61.236 kg)  BMI 29.21 kg/m2   General: Well-nourished, well-developed in no acute distress.  Head: Normocephalic, atraumatic.   Eyes: Conjunctiva pink, no icterus. Mouth: Oropharyngeal mucosa moist and pink , no lesions erythema or exudate. Neck: Supple without thyromegaly, masses, or lymphadenopathy.  Lungs: Clear to auscultation  bilaterally.  Heart: Regular rate and rhythm, no murmurs rubs or gallops.  Abdomen: Bowel sounds are normal, nontender, nondistended, no hepatosplenomegaly or masses, no abdominal bruits or    hernia , no rebound or guarding.   Rectal: deferred Extremities: No lower extremity edema. No clubbing or deformities.  Neuro: Alert and oriented x  4 , grossly normal neurologically.  Skin: Warm and dry, no rash or jaundice.   Psych: Alert and cooperative, normal mood and affect.

## 2012-08-17 NOTE — Assessment & Plan Note (Signed)
Chronic nausea several days weekly for the past 10 months. Generally wakes up early morning hours with vomiting. Later throughout the day, she feels fine and is able to eat. She has had only vague pain in the right upper quadrant. Vomiting and pain both unrelated to meals. She is on chronic Mobic. Recommend upper endoscopy for further evaluation.  I have discussed the risks, alternatives, benefits with regards to but not limited to the risk of reaction to medication, bleeding, infection, perforation and the patient is agreeable to proceed. Written consent to be obtained.

## 2012-08-17 NOTE — Assessment & Plan Note (Signed)
Family history of colon cancer, brother at age less than 67. Personal history of adenomatous colon polyp back in 2008. Due for surveillance colonoscopy at this time.  I have discussed the risks, alternatives, benefits with regards to but not limited to the risk of reaction to medication, bleeding, infection, perforation and the patient is agreeable to proceed. Written consent to be obtained.

## 2012-08-17 NOTE — Progress Notes (Signed)
Faxed to PCP

## 2012-08-17 NOTE — Patient Instructions (Signed)
We have scheduled you for an upper endoscopy and colonoscopy with Dr. Gala Romney. Please see separate instructions. Day of prep, you will need to decrease glipizide to half a tablet once a day, Glucophage half a tablet twice that day.

## 2012-08-29 DIAGNOSIS — H353190 Nonexudative age-related macular degeneration, unspecified eye, stage unspecified: Secondary | ICD-10-CM | POA: Diagnosis not present

## 2012-09-06 ENCOUNTER — Encounter (HOSPITAL_COMMUNITY): Payer: Self-pay | Admitting: Pharmacy Technician

## 2012-09-13 HISTORY — PX: ESOPHAGOGASTRODUODENOSCOPY: SHX1529

## 2012-09-14 ENCOUNTER — Encounter (HOSPITAL_COMMUNITY)
Admission: RE | Disposition: A | Discharge status: Home / Self Care | Payer: Self-pay | Source: Ambulatory Visit | Attending: Internal Medicine

## 2012-09-14 ENCOUNTER — Ambulatory Visit (HOSPITAL_COMMUNITY)
Admission: RE | Admit: 2012-09-14 | Discharge: 2012-09-14 | Disposition: A | Discharge status: Home / Self Care | Payer: Medicare Other | Source: Ambulatory Visit | Attending: Internal Medicine | Admitting: Internal Medicine

## 2012-09-14 ENCOUNTER — Encounter (HOSPITAL_COMMUNITY): Payer: Self-pay | Admitting: *Deleted

## 2012-09-14 DIAGNOSIS — R112 Nausea with vomiting, unspecified: Secondary | ICD-10-CM | POA: Insufficient documentation

## 2012-09-14 DIAGNOSIS — K259 Gastric ulcer, unspecified as acute or chronic, without hemorrhage or perforation: Secondary | ICD-10-CM | POA: Insufficient documentation

## 2012-09-14 DIAGNOSIS — D128 Benign neoplasm of rectum: Secondary | ICD-10-CM | POA: Insufficient documentation

## 2012-09-14 DIAGNOSIS — Z860100 Personal history of colon polyps, unspecified: Secondary | ICD-10-CM | POA: Insufficient documentation

## 2012-09-14 DIAGNOSIS — Z01812 Encounter for preprocedural laboratory examination: Secondary | ICD-10-CM | POA: Diagnosis not present

## 2012-09-14 DIAGNOSIS — K449 Diaphragmatic hernia without obstruction or gangrene: Secondary | ICD-10-CM | POA: Insufficient documentation

## 2012-09-14 DIAGNOSIS — K620 Anal polyp: Secondary | ICD-10-CM

## 2012-09-14 DIAGNOSIS — K5730 Diverticulosis of large intestine without perforation or abscess without bleeding: Secondary | ICD-10-CM

## 2012-09-14 DIAGNOSIS — K2980 Duodenitis without bleeding: Secondary | ICD-10-CM

## 2012-09-14 DIAGNOSIS — K621 Rectal polyp: Secondary | ICD-10-CM

## 2012-09-14 DIAGNOSIS — K2970 Gastritis, unspecified, without bleeding: Secondary | ICD-10-CM | POA: Diagnosis not present

## 2012-09-14 DIAGNOSIS — Z1211 Encounter for screening for malignant neoplasm of colon: Secondary | ICD-10-CM

## 2012-09-14 DIAGNOSIS — E119 Type 2 diabetes mellitus without complications: Secondary | ICD-10-CM | POA: Insufficient documentation

## 2012-09-14 DIAGNOSIS — I10 Essential (primary) hypertension: Secondary | ICD-10-CM | POA: Insufficient documentation

## 2012-09-14 DIAGNOSIS — Z800 Family history of malignant neoplasm of digestive organs: Secondary | ICD-10-CM

## 2012-09-14 DIAGNOSIS — R110 Nausea: Secondary | ICD-10-CM

## 2012-09-14 DIAGNOSIS — D129 Benign neoplasm of anus and anal canal: Secondary | ICD-10-CM | POA: Diagnosis not present

## 2012-09-14 HISTORY — PX: COLONOSCOPY: SHX5424

## 2012-09-14 LAB — GLUCOSE, CAPILLARY: Glucose-Capillary: 253 mg/dL — ABNORMAL HIGH (ref 70–99)

## 2012-09-14 SURGERY — COLONOSCOPY WITH ESOPHAGOGASTRODUODENOSCOPY (EGD)
Anesthesia: Moderate Sedation

## 2012-09-14 MED ORDER — MEPERIDINE HCL 100 MG/ML IJ SOLN
INTRAMUSCULAR | Status: AC
  Filled 2012-09-14: qty 2

## 2012-09-14 MED ORDER — STERILE WATER FOR IRRIGATION IR SOLN
Status: DC | PRN
  Administered 2012-09-14: 10:00:00

## 2012-09-14 MED ORDER — SODIUM CHLORIDE 0.45 % IV SOLN
INTRAVENOUS | Status: DC
  Administered 2012-09-14: 1000 mL via INTRAVENOUS

## 2012-09-14 MED ORDER — MIDAZOLAM HCL 5 MG/5ML IJ SOLN
INTRAMUSCULAR | Status: DC | PRN
  Administered 2012-09-14: 1 mg via INTRAVENOUS
  Administered 2012-09-14 (×2): 2 mg via INTRAVENOUS
  Administered 2012-09-14: 1 mg via INTRAVENOUS

## 2012-09-14 MED ORDER — BUTAMBEN-TETRACAINE-BENZOCAINE 2-2-14 % EX AERO
INHALATION_SPRAY | CUTANEOUS | Status: DC | PRN
  Administered 2012-09-14: 2 via TOPICAL

## 2012-09-14 MED ORDER — MIDAZOLAM HCL 5 MG/5ML IJ SOLN
INTRAMUSCULAR | Status: AC
  Filled 2012-09-14: qty 10

## 2012-09-14 MED ORDER — MEPERIDINE HCL 100 MG/ML IJ SOLN
INTRAMUSCULAR | Status: DC | PRN
  Administered 2012-09-14 (×2): 50 mg via INTRAVENOUS

## 2012-09-14 NOTE — H&P (View-Only) (Signed)
Primary Care Physician:  Glo Herring., MD  Primary Gastroenterologist:  Garfield Cornea, MD   Chief Complaint  Patient presents with  . Colonoscopy  . Emesis    HPI:  [name] [name] is a 66 y.o. female here to schedule surveillance colonoscopy. Family h/o colon cancer, brother. Personal h/o adenomatous colon polyp in 2008.  04/2012 back surgery. Complicated by postop anemia requiring 3 units of prbcs. She had a normal hemoglobin prior surgery of 14.7 (05/06/2012). Postoperatively hemoglobin was 5.9. Hemoglobin 10.8 after 3 units of packed red blood cells.  Patient complains of frequent vomiting for the past 10 months. Vomiting early a.m., 2-3 times per week. Usually before breakfast. Denies chronic nausea. Has to take food with medications or will have vomiting. No heartburn. Started eating Activia again, helped some. Takes fiber pill every night, usually has daily BM. Last night, diarrhea. No melena, brbpr. Occasional right upper quadrant pain, worse with constipation. Pain unrelated to meals. Denies any fever or chills.       Current Outpatient Prescriptions  Medication Sig Dispense Refill  . amLODipine (NORVASC) 10 MG tablet Take 10 mg by mouth daily.        . Ascorbic Acid (VITAMIN C PO) Take 1 tablet by mouth daily.       Marland Kitchen aspirin EC 81 MG tablet Take 81 mg by mouth daily.        . B Complex-C (B-COMPLEX WITH VITAMIN C) tablet Take 1 tablet by mouth daily.        Marland Kitchen CALCIUM PO Take 1 tablet by mouth every evening.        . enalapril (VASOTEC) 20 MG tablet Take 20 mg by mouth daily.      Marland Kitchen glipiZIDE (GLUCOTROL XL) 5 MG 24 hr tablet Take 5 mg by mouth daily.        . hydrochlorothiazide 25 MG tablet Take 12.5 mg by mouth daily.       Marland Kitchen HYDROcodone-acetaminophen (VICODIN) 5-500 MG per tablet Take 1 tablet by mouth every 6 (six) hours as needed. For pain       . latanoprost (XALATAN) 0.005 % ophthalmic solution Place 1 drop into both eyes at bedtime.        Marland Kitchen levothyroxine  (SYNTHROID, LEVOTHROID) 88 MCG tablet Take 88 mcg by mouth daily.        . meloxicam (MOBIC) 15 MG tablet Take 15 mg by mouth daily.        . metFORMIN (GLUCOPHAGE) 500 MG tablet Take 500 mg by mouth 2 (two) times daily with a meal.        . Nutritional Supplements (MELATONIN PO) Take 2 tablets by mouth at bedtime.      Marland Kitchen omega-3 acid ethyl esters (LOVAZA) 1 G capsule Take 2 g by mouth daily.        . potassium chloride SA (K-DUR,KLOR-CON) 20 MEQ tablet Take 20 mEq by mouth daily.       . Prenatal Vit-Fe Fumarate-FA (PRENATAL MULTIVITAMIN) TABS Take 1 tablet by mouth daily.      . simvastatin (ZOCOR) 20 MG tablet Take 20 mg by mouth at bedtime.        . sodium chloride (OCEAN) 0.65 % nasal spray Place 1 spray into the nose as needed.      Marland Kitchen VITAMIN D, CHOLECALCIFEROL, PO Take 1 tablet by mouth daily.        Marland Kitchen VITAMIN E PO Take 1 tablet by mouth daily.  Allergies as of 08/17/2012  . (No Known Allergies)    Past Medical History  Diagnosis Date  . Diabetes mellitus   . Hypertension   . Thyroid disease   . Arthritis   . Hypothyroidism   . Diverticulitis   . Glaucoma   . Hyperlipidemia   . Rheumatoid arthritis   . Osteoarthritis   . Heart murmur   . Aortic stenosis   . Melanoma     left thigh, 2010    Past Surgical History  Procedure Date  . Foot surgery 1994    left   . Abdominal hysterectomy 1988  . Cholecystectomy 1974  . Back surgery 07/2008    fusion  . Appendectomy   . Colonoscopy 06/06/2007    Proximal diminutive rectal polyps cold biopsied/removed, otherwise normal rectum/ Left-sided diverticula, the remainder of the colonic mucosa appeared normal. Path-tubular adnemoa.   . Back surgery [id number]    complicated by post-op anemia requiring transfusion  . Melanoma excision 2010    Family History  Problem Relation Age of Onset  . Anesthesia problems Neg Hx   . Colon cancer Brother     diagnosed at age 74    History   Social History  . Marital Status:  Widowed    Spouse Name: N/A    Number of Children: N/A  . Years of Education: N/A   Occupational History  . Not on file.   Social History Main Topics  . Smoking status: Never Smoker   . Smokeless tobacco: Not on file  . Alcohol Use: Yes     "Wine a couple times a month"  . Drug Use: No  . Sexually Active:    Other Topics Concern  . Not on file   Social History Narrative   Husband deceased 05-06-11.       ROS:  General: Negative for anorexia, weight loss, fever, chills, fatigue, weakness. Eyes: Negative for vision changes.  ENT: Negative for hoarseness, difficulty swallowing , nasal congestion. CV: Negative for chest pain, angina, palpitations, dyspnea on exertion, peripheral edema.  Respiratory: Negative for dyspnea at rest, dyspnea on exertion, cough, sputum, wheezing.  GI: See history of present illness. GU:  Negative for dysuria, hematuria, urinary incontinence. Complains of chronic urinary frequency, nocturnal urination, feels as if bladder is not emptying. History of needing bladder stem stretched before.  MS: complains of hip pain, low back pain.  Derm: Negative for rash or itching.  Neuro: Negative for weakness, abnormal sensation, seizure, frequent headaches, memory loss, confusion.  Psych: Negative for anxiety, suicidal ideation, hallucinations. Depression since loss of spouse last year. Endo: Negative for unusual weight change.  Heme: Negative for bruising or bleeding. Allergy: Negative for rash or hives.    Physical Examination:  BP 162/82  Pulse 88  Temp 98.4 F (36.9 C) (Temporal)  Ht 4\' 9"  (1.448 m)  Wt 135 lb (61.236 kg)  BMI 29.21 kg/m2   General: Well-nourished, well-developed in no acute distress.  Head: Normocephalic, atraumatic.   Eyes: Conjunctiva pink, no icterus. Mouth: Oropharyngeal mucosa moist and pink , no lesions erythema or exudate. Neck: Supple without thyromegaly, masses, or lymphadenopathy.  Lungs: Clear to auscultation  bilaterally.  Heart: Regular rate and rhythm, no murmurs rubs or gallops.  Abdomen: Bowel sounds are normal, nontender, nondistended, no hepatosplenomegaly or masses, no abdominal bruits or    hernia , no rebound or guarding.   Rectal: deferred Extremities: No lower extremity edema. No clubbing or deformities.  Neuro: Alert and oriented x  4 , grossly normal neurologically.  Skin: Warm and dry, no rash or jaundice.   Psych: Alert and cooperative, normal mood and affect.

## 2012-09-14 NOTE — Op Note (Signed)
[facility] [address] [place], [address]   ENDOSCOPY PROCEDURE REPORT  PATIENT: [name] [name], [name] [name]  MR#: [id number] BIRTHDATE: 1946/07/27 , 61  yrs. old GENDER: Female ENDOSCOPIST: R. Garfield Cornea, MD FACP [facility] R.  Garfield Cornea, MD FACP [facility] REFERRED BY:  Kerin Perna, M.D. PROCEDURE DATE:  09/14/2012 PROCEDURE:     EGD with gastric and small bowel biopsy  INDICATIONS:     chronic nausea and vomiting; NSAID use  INFORMED CONSENT:   The risks, benefits, limitations, alternatives and imponderables have been discussed.  The potential for biopsy, esophogeal dilation, etc. have also been reviewed.  Questions have been answered.  All parties agreeable.  Please see the history and physical in the medical record for more information.  MEDICATIONS:   Versed 5 mg IV 100 mg IV in divided doses. Cetacaine spray  DESCRIPTION OF PROCEDURE:   The [id number]  endoscope was introduced through the mouth and advanced to the second portion of the duodenum without difficulty or limitations.  The mucosal surfaces were surveyed very carefully during advancement of the scope and upon withdrawal.  Retroflexion view of the proximal stomach and esophagogastric junction was performed.      FINDINGS: normal patent appearing tubular esophagus. Stomach empty. Small hiatal hernia. 4 mm antral prepyloric ulcer with satellite or as. Does appear to be a benign lesion. No infiltrating process observed. Pylorus patent. The patient had bulbar erosions and rather intense patchy erythema of the second third portion of the duodenum. No scalloping glossal folds.  THERAPEUTIC / DIAGNOSTIC MANEUVERS PERFORMED:  biopsies of the gastric ulcer and duodenal mucosa taken for histologic study   COMPLICATIONS:  None  IMPRESSION:    Gastric ulcer;  gastric and duodenal erosions-status post biopsy. Small hernia.  RECOMMENDATIONS: Stop Mobic; begin Protonix 40 mg orally twice daily.  Followup on pathology. See colonoscopy report.    _______________________________ R. Garfield Cornea, MD FACP [facility] eSigned:  R. Garfield Cornea, MD FACP [facility] 09/14/2012 10:43 AM     CC:

## 2012-09-14 NOTE — Interval H&P Note (Signed)
History and Physical Interval Note:  09/14/2012 10:19 AM  [name] [name]  has presented today for surgery, with the diagnosis of hx of colon polyps, nausesa  The various methods of treatment have been discussed with the patient and family. After consideration of risks, benefits and other options for treatment, the patient has consented to  Procedure(s) (LRB) with comments: COLONOSCOPY WITH ESOPHAGOGASTRODUODENOSCOPY (EGD) (N/A) - 9:30am as a surgical intervention .  The patient's history has been reviewed, patient examined, no change in status, stable for surgery.  I have reviewed the patient's chart and labs.  Questions were answered to the patient's satisfaction.     Manus Rudd

## 2012-09-14 NOTE — Op Note (Signed)
[facility] [address] [place], [address]   COLONOSCOPY PROCEDURE REPORT  PATIENT: [name] [name], [name] [name]  MR#:         [id number] BIRTHDATE: 1946-12-01 , 24  yrs. old GENDER: Female ENDOSCOPIST: R.  Garfield Cornea, MD FACP [facility] REFERRED BY:  Kerin Perna, M.D. PROCEDURE DATE:  09/14/2012 PROCEDURE:     colonoscopy with biopsy  INDICATIONS: personal history of colonic polyps and positive family history of colon cancer  INFORMED CONSENT:  The risks, benefits, alternatives and imponderables including but not limited to bleeding, perforation as well as the possibility of a missed lesion have been reviewed.  The potential for biopsy, lesion removal, etc. have also been discussed.  Questions have been answered.  All parties agreeable. Please see the history and physical in the medical record for more information.  MEDICATIONS: Versed 6 mg IV and Demerol 100 mg IV in divided doses the  DESCRIPTION OF PROCEDURE:  After a digital rectal exam was performed, the [id number]  colonoscope was advanced from the anus through the rectum and colon to the area of the cecum, ileocecal valve and appendiceal orifice.  The cecum was deeply intubated.  These structures were well-seen and photographed for the record.  From the level of the cecum and ileocecal valve, the scope was slowly and cautiously withdrawn.  The mucosal surfaces were carefully surveyed utilizing scope tip deflection to facilitate fold flattening as needed.  The scope was pulled down into the rectum where a thorough examination including retroflexion was performed.    FINDINGS:  marginal to poor prep particularly on the right side. 2 diminutive rectal polyps in 5 cm in fromthe anal verge; otherwise normal rectum. Scattered left-sided diverticula; the remainder of the colonic mucosa appeared grossly normal, however poor prep, particularly on the right side, compromised exam. A smaller or flat lesion  could easily have been obscured by the poor preparation.  THERAPEUTIC / DIAGNOSTIC MANEUVERS PERFORMED:  The above-mentioned rectal polyps were cold biopsied/removed  COMPLICATIONS: none  CECAL WITHDRAWAL TIME:  10 minutes  IMPRESSION:  Rectal polyps-removed as described above. Colonic diverticulosis. Poor prep him for the examination  RECOMMENDATIONS: Followup on pathology. See EGD report.   _______________________________ eSigned:  R. Garfield Cornea, MD FACP [facility] 09/14/2012 11:05 AM   CC:    PATIENT NAME:  [name] [name], [name] [name] MR#: [id number]

## 2012-09-17 ENCOUNTER — Encounter: Payer: Self-pay | Admitting: Internal Medicine

## 2012-09-19 DIAGNOSIS — H2510 Age-related nuclear cataract, unspecified eye: Secondary | ICD-10-CM | POA: Diagnosis not present

## 2012-09-19 DIAGNOSIS — M545 Low back pain, unspecified: Secondary | ICD-10-CM | POA: Diagnosis not present

## 2012-09-19 DIAGNOSIS — H35349 Macular cyst, hole, or pseudohole, unspecified eye: Secondary | ICD-10-CM | POA: Diagnosis not present

## 2012-09-19 DIAGNOSIS — H02839 Dermatochalasis of unspecified eye, unspecified eyelid: Secondary | ICD-10-CM | POA: Diagnosis not present

## 2012-09-19 DIAGNOSIS — H4011X0 Primary open-angle glaucoma, stage unspecified: Secondary | ICD-10-CM | POA: Diagnosis not present

## 2012-09-19 DIAGNOSIS — H353190 Nonexudative age-related macular degeneration, unspecified eye, stage unspecified: Secondary | ICD-10-CM | POA: Diagnosis not present

## 2012-09-20 DIAGNOSIS — M76899 Other specified enthesopathies of unspecified lower limb, excluding foot: Secondary | ICD-10-CM | POA: Diagnosis not present

## 2012-09-22 ENCOUNTER — Encounter: Payer: Self-pay | Admitting: *Deleted

## 2012-09-25 DIAGNOSIS — Z23 Encounter for immunization: Secondary | ICD-10-CM | POA: Diagnosis not present

## 2012-10-06 DIAGNOSIS — E1159 Type 2 diabetes mellitus with other circulatory complications: Secondary | ICD-10-CM | POA: Diagnosis not present

## 2012-10-06 DIAGNOSIS — M159 Polyosteoarthritis, unspecified: Secondary | ICD-10-CM | POA: Diagnosis not present

## 2012-10-06 DIAGNOSIS — IMO0001 Reserved for inherently not codable concepts without codable children: Secondary | ICD-10-CM | POA: Diagnosis not present

## 2012-10-06 DIAGNOSIS — Z6839 Body mass index (BMI) 39.0-39.9, adult: Secondary | ICD-10-CM | POA: Diagnosis not present

## 2012-10-06 DIAGNOSIS — E669 Obesity, unspecified: Secondary | ICD-10-CM | POA: Diagnosis not present

## 2012-10-06 DIAGNOSIS — E1139 Type 2 diabetes mellitus with other diabetic ophthalmic complication: Secondary | ICD-10-CM | POA: Diagnosis not present

## 2012-10-06 DIAGNOSIS — E1129 Type 2 diabetes mellitus with other diabetic kidney complication: Secondary | ICD-10-CM | POA: Diagnosis not present

## 2012-10-06 DIAGNOSIS — E1149 Type 2 diabetes mellitus with other diabetic neurological complication: Secondary | ICD-10-CM | POA: Diagnosis not present

## 2012-10-18 DIAGNOSIS — H2510 Age-related nuclear cataract, unspecified eye: Secondary | ICD-10-CM | POA: Diagnosis not present

## 2012-10-18 DIAGNOSIS — H269 Unspecified cataract: Secondary | ICD-10-CM | POA: Diagnosis not present

## 2012-11-16 DIAGNOSIS — H2510 Age-related nuclear cataract, unspecified eye: Secondary | ICD-10-CM | POA: Diagnosis not present

## 2012-11-25 ENCOUNTER — Encounter: Payer: Self-pay | Admitting: *Deleted

## 2012-11-30 DIAGNOSIS — D235 Other benign neoplasm of skin of trunk: Secondary | ICD-10-CM | POA: Diagnosis not present

## 2012-11-30 DIAGNOSIS — L570 Actinic keratosis: Secondary | ICD-10-CM | POA: Diagnosis not present

## 2012-11-30 DIAGNOSIS — D485 Neoplasm of uncertain behavior of skin: Secondary | ICD-10-CM | POA: Diagnosis not present

## 2012-11-30 DIAGNOSIS — Z85820 Personal history of malignant melanoma of skin: Secondary | ICD-10-CM | POA: Diagnosis not present

## 2012-12-22 ENCOUNTER — Other Ambulatory Visit (HOSPITAL_COMMUNITY): Payer: Self-pay | Admitting: Internal Medicine

## 2012-12-22 DIAGNOSIS — E039 Hypothyroidism, unspecified: Secondary | ICD-10-CM | POA: Diagnosis not present

## 2012-12-22 DIAGNOSIS — I10 Essential (primary) hypertension: Secondary | ICD-10-CM | POA: Diagnosis not present

## 2012-12-22 DIAGNOSIS — E785 Hyperlipidemia, unspecified: Secondary | ICD-10-CM | POA: Diagnosis not present

## 2012-12-22 DIAGNOSIS — Z6841 Body Mass Index (BMI) 40.0 and over, adult: Secondary | ICD-10-CM | POA: Diagnosis not present

## 2012-12-22 DIAGNOSIS — E038 Other specified hypothyroidism: Secondary | ICD-10-CM | POA: Diagnosis not present

## 2012-12-22 DIAGNOSIS — Z0000 Encounter for general adult medical examination without abnormal findings: Secondary | ICD-10-CM

## 2012-12-26 ENCOUNTER — Ambulatory Visit (HOSPITAL_COMMUNITY): Payer: Medicare Other

## 2013-01-10 DIAGNOSIS — H2510 Age-related nuclear cataract, unspecified eye: Secondary | ICD-10-CM | POA: Diagnosis not present

## 2013-01-10 DIAGNOSIS — H269 Unspecified cataract: Secondary | ICD-10-CM | POA: Diagnosis not present

## 2013-01-16 ENCOUNTER — Emergency Department (HOSPITAL_COMMUNITY)
Admission: EM | Admit: 2013-01-16 | Discharge: 2013-01-16 | Disposition: A | Discharge status: Home / Self Care | Payer: Medicare Other | Attending: Emergency Medicine | Admitting: Emergency Medicine

## 2013-01-16 ENCOUNTER — Encounter (HOSPITAL_COMMUNITY): Payer: Self-pay | Admitting: *Deleted

## 2013-01-16 DIAGNOSIS — I10 Essential (primary) hypertension: Secondary | ICD-10-CM | POA: Diagnosis not present

## 2013-01-16 DIAGNOSIS — Z79899 Other long term (current) drug therapy: Secondary | ICD-10-CM | POA: Insufficient documentation

## 2013-01-16 DIAGNOSIS — Z85820 Personal history of malignant melanoma of skin: Secondary | ICD-10-CM | POA: Diagnosis not present

## 2013-01-16 DIAGNOSIS — J3489 Other specified disorders of nose and nasal sinuses: Secondary | ICD-10-CM | POA: Diagnosis not present

## 2013-01-16 DIAGNOSIS — E785 Hyperlipidemia, unspecified: Secondary | ICD-10-CM | POA: Diagnosis not present

## 2013-01-16 DIAGNOSIS — J40 Bronchitis, not specified as acute or chronic: Secondary | ICD-10-CM

## 2013-01-16 DIAGNOSIS — R011 Cardiac murmur, unspecified: Secondary | ICD-10-CM | POA: Diagnosis not present

## 2013-01-16 DIAGNOSIS — Z7982 Long term (current) use of aspirin: Secondary | ICD-10-CM | POA: Insufficient documentation

## 2013-01-16 DIAGNOSIS — R0602 Shortness of breath: Secondary | ICD-10-CM | POA: Diagnosis not present

## 2013-01-16 DIAGNOSIS — Z8719 Personal history of other diseases of the digestive system: Secondary | ICD-10-CM | POA: Diagnosis not present

## 2013-01-16 DIAGNOSIS — E119 Type 2 diabetes mellitus without complications: Secondary | ICD-10-CM | POA: Diagnosis not present

## 2013-01-16 DIAGNOSIS — J209 Acute bronchitis, unspecified: Secondary | ICD-10-CM | POA: Insufficient documentation

## 2013-01-16 DIAGNOSIS — E039 Hypothyroidism, unspecified: Secondary | ICD-10-CM | POA: Diagnosis not present

## 2013-01-16 DIAGNOSIS — R062 Wheezing: Secondary | ICD-10-CM | POA: Diagnosis not present

## 2013-01-16 DIAGNOSIS — R5383 Other fatigue: Secondary | ICD-10-CM | POA: Diagnosis not present

## 2013-01-16 DIAGNOSIS — Z8679 Personal history of other diseases of the circulatory system: Secondary | ICD-10-CM | POA: Diagnosis not present

## 2013-01-16 DIAGNOSIS — R5381 Other malaise: Secondary | ICD-10-CM | POA: Diagnosis not present

## 2013-01-16 DIAGNOSIS — Z8739 Personal history of other diseases of the musculoskeletal system and connective tissue: Secondary | ICD-10-CM | POA: Diagnosis not present

## 2013-01-16 DIAGNOSIS — H409 Unspecified glaucoma: Secondary | ICD-10-CM | POA: Insufficient documentation

## 2013-01-16 HISTORY — DX: Encounter for other specified aftercare: Z51.89

## 2013-01-16 HISTORY — DX: Bronchitis, not specified as acute or chronic: J40

## 2013-01-16 MED ORDER — PREDNISONE 50 MG PO TABS: ORAL_TABLET | ORAL | Status: DC

## 2013-01-16 MED ORDER — PREDNISONE 50 MG PO TABS
60.0000 mg | ORAL_TABLET | Freq: Once | ORAL | Status: AC
  Administered 2013-01-16: 60 mg via ORAL
  Filled 2013-01-16: qty 1

## 2013-01-16 MED ORDER — AZITHROMYCIN 250 MG PO TABS: 250.0000 mg | ORAL_TABLET | Freq: Every day | ORAL | Status: DC

## 2013-01-16 NOTE — ED Notes (Signed)
Cough, green sputum, wheeze , ribs are sore.

## 2013-01-16 NOTE — ED Provider Notes (Signed)
History     CSN: [id number]  Arrival date & time 01/16/13  1655   First MD Initiated Contact with Patient 01/16/13 1844      Chief Complaint  Patient presents with  . Cough     Patient is a 67 y.o. female presenting with cough. The history is provided by the patient.  Cough This is a new problem. The current episode started more than 2 days ago. The problem occurs hourly. The problem has been gradually worsening. The cough is productive of sputum. Associated symptoms include rhinorrhea, shortness of breath and wheezing. Pertinent negatives include no chest pain. Treatments tried: rest. The treatment provided no relief.  pt reports cough, congestion for past several days.  She reports mild SOB.  No active CP, just has "rib pain" when she coughs.  No LE edema.  Reports this is similar to prior episodes of bronchitis.  She reports she has nebulizers at home but has been using them frequently.    Past Medical History  Diagnosis Date  . Diabetes mellitus   . Hypertension   . Thyroid disease   . Arthritis   . Hypothyroidism   . Diverticulitis   . Glaucoma(365)   . Hyperlipidemia   . Rheumatoid arthritis   . Osteoarthritis   . Heart murmur   . Aortic stenosis   . Melanoma     left thigh, 2010  . Bronchitis   . Blood transfusion without reported diagnosis     Past Surgical History  Procedure Date  . Foot surgery 1994    left   . Abdominal hysterectomy 1988  . Cholecystectomy 1974  . Back surgery 07/2008    fusion  . Appendectomy   . Colonoscopy 06/06/2007    Proximal diminutive rectal polyps cold biopsied/removed, otherwise normal rectum/ Left-sided diverticula, the remainder of the colonic mucosa appeared normal. Path-tubular adnemoa.   . Back surgery [id number]    complicated by post-op anemia requiring transfusion  . Melanoma excision 2010  . Eye surgery   . Macular degen     Family History  Problem Relation Age of Onset  . Anesthesia problems Neg Hx   . Colon cancer  Brother     diagnosed at age 33    History  Substance Use Topics  . Smoking status: Never Smoker   . Smokeless tobacco: Not on file  . Alcohol Use: Yes     Comment: "Wine a couple times a month"    OB History    Grav Para Term Preterm Abortions TAB SAB Ect Mult Living                  Review of Systems  Constitutional: Positive for fatigue.  HENT: Positive for rhinorrhea.   Respiratory: Positive for cough, shortness of breath and wheezing.   Cardiovascular: Negative for chest pain.  Gastrointestinal: Negative for abdominal pain.  Neurological: Negative for weakness.  Psychiatric/Behavioral: Negative for agitation.  All other systems reviewed and are negative.    Allergies  Review of patient's allergies indicates no known allergies.  Home Medications   Current Outpatient Rx  Name  Route  Sig  Dispense  Refill  . AMLODIPINE BESYLATE 10 MG PO TABS   Oral   Take 10 mg by mouth daily.           Marland Kitchen VITAMIN C PO   Oral   Take 1 tablet by mouth daily.          . ASPIRIN EC 325 MG PO  TBEC   Oral   Take 325 mg by mouth daily.         . AZITHROMYCIN 250 MG PO TABS   Oral   Take 1 tablet (250 mg total) by mouth daily. Take first 2 tablets together, then 1 every day until finished.   6 tablet   0   . B COMPLEX-C PO TABS   Oral   Take 1 tablet by mouth daily.           Marland Kitchen CALCIUM PO   Oral   Take 600 mg by mouth every evening.          . CHOLECALCIFEROL 5000 UNITS PO CAPS   Oral   Take 5,000 Units by mouth daily.         Marland Kitchen CIMETIDINE 200 MG PO TABS   Oral   Take 200 mg by mouth daily as needed.         . ENALAPRIL MALEATE 20 MG PO TABS   Oral   Take 20 mg by mouth daily.         Marland Kitchen FIBER PO CAPS   Oral   Take 1 capsule by mouth daily.         Marland Kitchen GINKGO BILOBA 60 MG PO CAPS   Oral   Take 1 capsule by mouth daily.         Marland Kitchen GLIPIZIDE ER 5 MG PO TB24   Oral   Take 5 mg by mouth daily.           Marland Kitchen HYDROCHLOROTHIAZIDE 25 MG PO  TABS   Oral   Take 12.5 mg by mouth daily.          Marland Kitchen HYDROCODONE-ACETAMINOPHEN 5-500 MG PO TABS   Oral   Take 1 tablet by mouth every 6 (six) hours as needed. For pain          . LATANOPROST 0.005 % OP SOLN   Both Eyes   Place 1 drop into both eyes at bedtime.           Marland Kitchen LEVOTHYROXINE SODIUM 100 MCG PO TABS   Oral   Take 100 mcg by mouth daily.         Marland Kitchen METFORMIN HCL ER (MOD) 1000 MG PO TB24   Oral   Take 1,000 mg by mouth 2 (two) times daily with a meal.         . EQ ESTROBLEND MENOPAUSE PO TABS   Oral   Take 1 tablet by mouth daily.         Marland Kitchen MELATONIN PO   Oral   Take 2 tablets by mouth at bedtime.         . OMEGA-3-ACID ETHYL ESTERS 1 G PO CAPS   Oral   Take 1 g by mouth daily.          Marland Kitchen POTASSIUM CHLORIDE CRYS ER 20 MEQ PO TBCR   Oral   Take 20 mEq by mouth daily.          Marland Kitchen PREDNISONE 50 MG PO TABS      One tablet PO daily   4 tablet   0   . PRENATAL MULTIVITAMIN CH   Oral   Take 1 tablet by mouth daily.         Marland Kitchen SIMVASTATIN 20 MG PO TABS   Oral   Take 20 mg by mouth at bedtime.           Marland Kitchen SALINE NASAL SPRAY 0.65 %  NA SOLN   Nasal   Place 1 spray into the nose as needed.         Marland Kitchen VITAMIN E 400 UNITS PO CAPS   Oral   Take 400 Units by mouth daily.           BP 174/64  Pulse 86  Temp 98.5 F (36.9 C) (Oral)  Resp 20  Ht 4\' 9"  (1.448 m)  Wt 130 lb (58.968 kg)  BMI 28.13 kg/m2  SpO2 100%  Physical Exam CONSTITUTIONAL: Well developed/well nourished HEAD AND FACE: Normocephalic/atraumatic EYES: EOMI/PERRL ENMT: Mucous membranes moist, nasal congestion NECK: supple no meningeal signs SPINE:entire spine nontender CV: S1/S2 noted, no loud murmurs noted LUNGS:scattered wheezes noted but no distress noted, no rales noted ABDOMEN: soft, nontender, no rebound or guarding GU:no cva tenderness NEURO: Pt is awake/alert, moves all extremitiesx4 EXTREMITIES: pulses normal, full ROM, no LE edema noted SKIN: warm,  color normal PSYCH: no abnormalities of mood noted  ED Course  Procedures   1. Bronchitis       MDM  Nursing notes including past medical history and social history reviewed and considered in documentation  I suspect viral bronchitis given history/exam.  She is in no distress.  I offered CXR but she would like to avoid CXR as she feels this is similar to prior.  This seems reasonable and she is nontoxic appearing.  Will start prednisone on top of albuterol.  I did write for zpack but told her to hold at least 24-48 hours to see if she responds to prednisone/albuterol. Pt agreeable        [name], MD 01/16/13 [phone]

## 2013-02-08 DIAGNOSIS — M545 Low back pain, unspecified: Secondary | ICD-10-CM | POA: Diagnosis not present

## 2013-02-08 DIAGNOSIS — R262 Difficulty in walking, not elsewhere classified: Secondary | ICD-10-CM | POA: Diagnosis not present

## 2013-04-17 ENCOUNTER — Inpatient Hospital Stay (HOSPITAL_COMMUNITY): Admission: RE | Admit: 2013-04-17 | Payer: Medicare Other | Source: Ambulatory Visit

## 2013-04-27 ENCOUNTER — Ambulatory Visit (HOSPITAL_COMMUNITY)
Admission: RE | Admit: 2013-04-27 | Discharge: 2013-04-27 | Disposition: A | Discharge status: Home / Self Care | Payer: Medicare Other | Source: Ambulatory Visit | Attending: Internal Medicine | Admitting: Internal Medicine

## 2013-04-27 DIAGNOSIS — Z1231 Encounter for screening mammogram for malignant neoplasm of breast: Secondary | ICD-10-CM | POA: Diagnosis not present

## 2013-04-27 DIAGNOSIS — Z0000 Encounter for general adult medical examination without abnormal findings: Secondary | ICD-10-CM

## 2013-07-14 DIAGNOSIS — IMO0001 Reserved for inherently not codable concepts without codable children: Secondary | ICD-10-CM | POA: Diagnosis not present

## 2013-07-14 DIAGNOSIS — Z6839 Body mass index (BMI) 39.0-39.9, adult: Secondary | ICD-10-CM | POA: Diagnosis not present

## 2013-07-14 DIAGNOSIS — E669 Obesity, unspecified: Secondary | ICD-10-CM | POA: Diagnosis not present

## 2013-07-14 DIAGNOSIS — G8929 Other chronic pain: Secondary | ICD-10-CM | POA: Diagnosis not present

## 2013-09-06 DIAGNOSIS — E785 Hyperlipidemia, unspecified: Secondary | ICD-10-CM | POA: Diagnosis not present

## 2013-09-06 DIAGNOSIS — N37 Urethral disorders in diseases classified elsewhere: Secondary | ICD-10-CM | POA: Diagnosis not present

## 2013-09-06 DIAGNOSIS — N390 Urinary tract infection, site not specified: Secondary | ICD-10-CM | POA: Diagnosis not present

## 2013-09-06 DIAGNOSIS — E039 Hypothyroidism, unspecified: Secondary | ICD-10-CM | POA: Diagnosis not present

## 2013-09-06 DIAGNOSIS — Z23 Encounter for immunization: Secondary | ICD-10-CM | POA: Diagnosis not present

## 2013-09-06 DIAGNOSIS — Z6841 Body Mass Index (BMI) 40.0 and over, adult: Secondary | ICD-10-CM | POA: Diagnosis not present

## 2013-09-17 ENCOUNTER — Encounter (HOSPITAL_COMMUNITY): Payer: Self-pay

## 2013-09-17 ENCOUNTER — Emergency Department (HOSPITAL_COMMUNITY)
Admission: EM | Admit: 2013-09-17 | Discharge: 2013-09-17 | Disposition: A | Discharge status: Home / Self Care | Payer: Medicare Other | Attending: Emergency Medicine | Admitting: Emergency Medicine

## 2013-09-17 DIAGNOSIS — M549 Dorsalgia, unspecified: Secondary | ICD-10-CM

## 2013-09-17 DIAGNOSIS — IMO0002 Reserved for concepts with insufficient information to code with codable children: Secondary | ICD-10-CM | POA: Insufficient documentation

## 2013-09-17 DIAGNOSIS — R52 Pain, unspecified: Secondary | ICD-10-CM | POA: Insufficient documentation

## 2013-09-17 DIAGNOSIS — Z9889 Other specified postprocedural states: Secondary | ICD-10-CM | POA: Diagnosis not present

## 2013-09-17 DIAGNOSIS — M129 Arthropathy, unspecified: Secondary | ICD-10-CM | POA: Insufficient documentation

## 2013-09-17 DIAGNOSIS — M1990 Unspecified osteoarthritis, unspecified site: Secondary | ICD-10-CM | POA: Insufficient documentation

## 2013-09-17 DIAGNOSIS — M069 Rheumatoid arthritis, unspecified: Secondary | ICD-10-CM | POA: Diagnosis not present

## 2013-09-17 DIAGNOSIS — E039 Hypothyroidism, unspecified: Secondary | ICD-10-CM | POA: Diagnosis not present

## 2013-09-17 DIAGNOSIS — M545 Low back pain, unspecified: Secondary | ICD-10-CM | POA: Diagnosis not present

## 2013-09-17 DIAGNOSIS — Z85820 Personal history of malignant melanoma of skin: Secondary | ICD-10-CM | POA: Insufficient documentation

## 2013-09-17 DIAGNOSIS — Z8669 Personal history of other diseases of the nervous system and sense organs: Secondary | ICD-10-CM | POA: Insufficient documentation

## 2013-09-17 DIAGNOSIS — Z7982 Long term (current) use of aspirin: Secondary | ICD-10-CM | POA: Diagnosis not present

## 2013-09-17 DIAGNOSIS — I10 Essential (primary) hypertension: Secondary | ICD-10-CM | POA: Insufficient documentation

## 2013-09-17 DIAGNOSIS — R011 Cardiac murmur, unspecified: Secondary | ICD-10-CM | POA: Diagnosis not present

## 2013-09-17 DIAGNOSIS — Z8719 Personal history of other diseases of the digestive system: Secondary | ICD-10-CM | POA: Diagnosis not present

## 2013-09-17 DIAGNOSIS — E785 Hyperlipidemia, unspecified: Secondary | ICD-10-CM | POA: Insufficient documentation

## 2013-09-17 DIAGNOSIS — E119 Type 2 diabetes mellitus without complications: Secondary | ICD-10-CM | POA: Diagnosis not present

## 2013-09-17 DIAGNOSIS — Z79899 Other long term (current) drug therapy: Secondary | ICD-10-CM | POA: Diagnosis not present

## 2013-09-17 DIAGNOSIS — Z8709 Personal history of other diseases of the respiratory system: Secondary | ICD-10-CM | POA: Diagnosis not present

## 2013-09-17 MED ORDER — HYDROCODONE-ACETAMINOPHEN 5-325 MG PO TABS: 1.0000 | ORAL_TABLET | Freq: Four times a day (QID) | ORAL | Status: DC | PRN

## 2013-09-17 MED ORDER — HYDROCODONE-ACETAMINOPHEN 5-325 MG PO TABS
1.0000 | ORAL_TABLET | Freq: Once | ORAL | Status: AC
  Administered 2013-09-17: 1 via ORAL
  Filled 2013-09-17: qty 1

## 2013-09-17 NOTE — ED Provider Notes (Signed)
CSN: [id number]     Arrival date & time 09/17/13  0029 History   First MD Initiated Contact with Patient 09/17/13 0242     Chief Complaint  Patient presents with  . Hip Pain  . Back Pain   (Consider location/radiation/quality/duration/timing/severity/associated sxs/prior Treatment) Patient is a 67 y.o. female presenting with hip pain and back pain. The history is provided by the patient.  Hip Pain Pertinent negatives include no chest pain, no abdominal pain, no headaches and no shortness of breath.  Back Pain Associated symptoms: no abdominal pain, no chest pain, no fever, no headaches, no numbness and no weakness    patient with increased pain in her low back radiating into both hips predominantly today. No acute trauma however she did tripped down one step and had a fall but pain is not worse since the fall it was a severe pain in her low back and hip area that resulted in the fall. Patient is not concerned about any bony injury. Pain is 8/10 similar pain she's had in the past she's had steroid injections there she followed by Dr. Riley Kill and a spine specialist. Patient is here for symptomatic pain relief. Has no numbness or weakness to her lower trimming these. Patient is currently being treated for a urinary tract infection has 2 more days of antibiotics but her symptoms have resolved. The back pain is sharp in nature and as stated radiates into the hips. Made worse by movement made better by rest.  Past Medical History  Diagnosis Date  . Diabetes mellitus   . Hypertension   . Thyroid disease   . Arthritis   . Hypothyroidism   . Diverticulitis   . Glaucoma   . Hyperlipidemia   . Rheumatoid arthritis(714.0)   . Osteoarthritis   . Heart murmur   . Aortic stenosis   . Melanoma     left thigh, 2010  . Bronchitis   . Blood transfusion without reported diagnosis    Past Surgical History  Procedure Laterality Date  . Foot surgery  1994    left   . Abdominal hysterectomy  1988  .  Cholecystectomy  1974  . Back surgery  07/2008    fusion  . Appendectomy    . Colonoscopy  06/06/2007    Proximal diminutive rectal polyps cold biopsied/removed, otherwise normal rectum/ Left-sided diverticula, the remainder of the colonic mucosa appeared normal. Path-tubular adnemoa.   . Back surgery  [id number]    complicated by post-op anemia requiring transfusion  . Melanoma excision  2010  . Eye surgery    . Macular degen     Family History  Problem Relation Age of Onset  . Anesthesia problems Neg Hx   . Colon cancer Brother     diagnosed at age 5   History  Substance Use Topics  . Smoking status: Never Smoker   . Smokeless tobacco: Not on file  . Alcohol Use: No     Comment: "Wine a couple times a month"   OB History   Grav Para Term Preterm Abortions TAB SAB Ect Mult Living                 Review of Systems  Constitutional: Negative for fever.  HENT: Negative for congestion.   Eyes: Negative for redness.  Respiratory: Negative for shortness of breath.   Cardiovascular: Negative for chest pain.  Gastrointestinal: Negative for abdominal pain.  Musculoskeletal: Positive for back pain and arthralgias.  Skin: Negative for rash.  Neurological:  Negative for weakness, numbness and headaches.  Hematological: Does not bruise/bleed easily.  Psychiatric/Behavioral: Negative for confusion.    Allergies  Review of patient's allergies indicates no known allergies.  Home Medications   Current Outpatient Rx  Name  Route  Sig  Dispense  Refill  . amLODipine (NORVASC) 10 MG tablet   Oral   Take 10 mg by mouth daily.           . Ascorbic Acid (VITAMIN C PO)   Oral   Take 1 tablet by mouth daily.          Marland Kitchen aspirin EC 325 MG tablet   Oral   Take 325 mg by mouth daily.         Marland Kitchen azithromycin (ZITHROMAX) 250 MG tablet   Oral   Take 1 tablet (250 mg total) by mouth daily. Take first 2 tablets together, then 1 every day until finished.   6 tablet   0   . B  Complex-C (B-COMPLEX WITH VITAMIN C) tablet   Oral   Take 1 tablet by mouth daily.           Marland Kitchen CALCIUM PO   Oral   Take 600 mg by mouth every evening.          . Cholecalciferol ([employer] VIT D 5000 HIGH-POTENCY) 5000 UNITS capsule   Oral   Take 5,000 Units by mouth daily.         . cimetidine (TAGAMET) 200 MG tablet   Oral   Take 200 mg by mouth daily as needed.         . enalapril (VASOTEC) 20 MG tablet   Oral   Take 20 mg by mouth daily.         . Fiber CAPS   Oral   Take 1 capsule by mouth daily.         . Ginkgo Biloba 60 MG CAPS   Oral   Take 1 capsule by mouth daily.         Marland Kitchen glipiZIDE (GLUCOTROL XL) 5 MG 24 hr tablet   Oral   Take 5 mg by mouth daily.           . hydrochlorothiazide 25 MG tablet   Oral   Take 12.5 mg by mouth daily.          Marland Kitchen HYDROcodone-acetaminophen (NORCO/VICODIN) 5-325 MG per tablet   Oral   Take 1-2 tablets by mouth every 6 (six) hours as needed for pain.   20 tablet   0   . HYDROcodone-acetaminophen (VICODIN) 5-500 MG per tablet   Oral   Take 1 tablet by mouth every 6 (six) hours as needed. For pain          . latanoprost (XALATAN) 0.005 % ophthalmic solution   Both Eyes   Place 1 drop into both eyes at bedtime.           Marland Kitchen levothyroxine (SYNTHROID, LEVOTHROID) 100 MCG tablet   Oral   Take 100 mcg by mouth daily.         . metFORMIN (GLUMETZA) 1000 MG (MOD) 24 hr tablet   Oral   Take 1,000 mg by mouth 2 (two) times daily with a meal.         . Nutritional Supplements (EQ ESTROBLEND MENOPAUSE) TABS   Oral   Take 1 tablet by mouth daily.         . Nutritional Supplements (MELATONIN PO)   Oral   Take  2 tablets by mouth at bedtime.         Marland Kitchen omega-3 acid ethyl esters (LOVAZA) 1 G capsule   Oral   Take 1 g by mouth daily.          . potassium chloride SA (K-DUR,KLOR-CON) 20 MEQ tablet   Oral   Take 20 mEq by mouth daily.          . predniSONE (DELTASONE) 50 MG tablet      One tablet PO  daily   4 tablet   0   . Prenatal Vit-Fe Fumarate-FA (PRENATAL MULTIVITAMIN) TABS   Oral   Take 1 tablet by mouth daily.         . simvastatin (ZOCOR) 20 MG tablet   Oral   Take 20 mg by mouth at bedtime.           . sodium chloride (OCEAN) 0.65 % nasal spray   Nasal   Place 1 spray into the nose as needed.         . vitamin E 400 UNIT capsule   Oral   Take 400 Units by mouth daily.          BP 167/62  Pulse 71  Temp(Src) 97.6 F (36.4 C) (Oral)  Resp 18  Ht 4\' 9"  (1.448 m)  Wt 132 lb 8 oz (60.102 kg)  BMI 28.66 kg/m2  SpO2 98% Physical Exam  Nursing note and vitals reviewed. Constitutional: She is oriented to person, place, and time. She appears well-developed and well-nourished. No distress.  HENT:  Head: Normocephalic and atraumatic.  Eyes: Conjunctivae and EOM are normal. Pupils are equal, round, and reactive to light.  Cardiovascular: Normal rate, regular rhythm and normal heart sounds.   Pulmonary/Chest: Effort normal and breath sounds normal.  Abdominal: Soft. Bowel sounds are normal. There is no tenderness.  Musculoskeletal: Normal range of motion. She exhibits no edema.  Mild paraspinous lumbar muscle tenderness no midline bony tenderness. No hip tenderness.  Neurological: She is alert and oriented to person, place, and time. No cranial nerve deficit. She exhibits normal muscle tone. Coordination normal.  Skin: Skin is warm. No rash noted.    ED Course  Procedures (including critical care time) Labs Review Labs Reviewed - No data to display Imaging Review No results found.  MDM   1. Back pain, acute    Patient with a history of lumbar back pain and hip pain in the past. Patient's had steroid injections. Patient with worsening of the low back pain radiating into both hips today. Patient did have a minimal fall down one step but is not concerned about any injury to the hips. No new pain since the fall. Patient has been on arthritic medications in  the past currently does not have any pain medications. She is followed by Dr. Riley Kill as well as a spine specialist. No focal neurological findings to her lower legs.    Mervin Kung, MD 09/17/13 [phone]

## 2013-09-17 NOTE — ED Notes (Signed)
Pt having pain in both hips and lower back, states she has had "shots"  For same in the past, states "bursitis"

## 2013-09-20 DIAGNOSIS — Z6839 Body mass index (BMI) 39.0-39.9, adult: Secondary | ICD-10-CM | POA: Diagnosis not present

## 2013-09-20 DIAGNOSIS — M7150 Other bursitis, not elsewhere classified, unspecified site: Secondary | ICD-10-CM | POA: Diagnosis not present

## 2013-09-20 DIAGNOSIS — M259 Joint disorder, unspecified: Secondary | ICD-10-CM | POA: Diagnosis not present

## 2013-09-23 ENCOUNTER — Encounter (HOSPITAL_COMMUNITY): Payer: Self-pay | Admitting: Emergency Medicine

## 2013-09-23 ENCOUNTER — Emergency Department (HOSPITAL_COMMUNITY): Payer: Medicare Other

## 2013-09-23 ENCOUNTER — Emergency Department (HOSPITAL_COMMUNITY)
Admission: EM | Admit: 2013-09-23 | Discharge: 2013-09-23 | Disposition: A | Discharge status: Home / Self Care | Payer: Medicare Other | Attending: Emergency Medicine | Admitting: Emergency Medicine

## 2013-09-23 DIAGNOSIS — Y939 Activity, unspecified: Secondary | ICD-10-CM | POA: Insufficient documentation

## 2013-09-23 DIAGNOSIS — Z9181 History of falling: Secondary | ICD-10-CM | POA: Diagnosis not present

## 2013-09-23 DIAGNOSIS — Z792 Long term (current) use of antibiotics: Secondary | ICD-10-CM | POA: Insufficient documentation

## 2013-09-23 DIAGNOSIS — Z7982 Long term (current) use of aspirin: Secondary | ICD-10-CM | POA: Diagnosis not present

## 2013-09-23 DIAGNOSIS — M25559 Pain in unspecified hip: Secondary | ICD-10-CM | POA: Diagnosis not present

## 2013-09-23 DIAGNOSIS — Z79899 Other long term (current) drug therapy: Secondary | ICD-10-CM | POA: Diagnosis not present

## 2013-09-23 DIAGNOSIS — M1990 Unspecified osteoarthritis, unspecified site: Secondary | ICD-10-CM | POA: Insufficient documentation

## 2013-09-23 DIAGNOSIS — E039 Hypothyroidism, unspecified: Secondary | ICD-10-CM | POA: Diagnosis not present

## 2013-09-23 DIAGNOSIS — Y92009 Unspecified place in unspecified non-institutional (private) residence as the place of occurrence of the external cause: Secondary | ICD-10-CM | POA: Insufficient documentation

## 2013-09-23 DIAGNOSIS — S9030XA Contusion of unspecified foot, initial encounter: Secondary | ICD-10-CM | POA: Insufficient documentation

## 2013-09-23 DIAGNOSIS — M25569 Pain in unspecified knee: Secondary | ICD-10-CM | POA: Diagnosis not present

## 2013-09-23 DIAGNOSIS — S8990XA Unspecified injury of unspecified lower leg, initial encounter: Secondary | ICD-10-CM | POA: Insufficient documentation

## 2013-09-23 DIAGNOSIS — Z85820 Personal history of malignant melanoma of skin: Secondary | ICD-10-CM | POA: Diagnosis not present

## 2013-09-23 DIAGNOSIS — IMO0002 Reserved for concepts with insufficient information to code with codable children: Secondary | ICD-10-CM | POA: Diagnosis not present

## 2013-09-23 DIAGNOSIS — N3289 Other specified disorders of bladder: Secondary | ICD-10-CM | POA: Diagnosis not present

## 2013-09-23 DIAGNOSIS — Z8709 Personal history of other diseases of the respiratory system: Secondary | ICD-10-CM | POA: Insufficient documentation

## 2013-09-23 DIAGNOSIS — E785 Hyperlipidemia, unspecified: Secondary | ICD-10-CM | POA: Insufficient documentation

## 2013-09-23 DIAGNOSIS — I10 Essential (primary) hypertension: Secondary | ICD-10-CM | POA: Diagnosis not present

## 2013-09-23 DIAGNOSIS — R011 Cardiac murmur, unspecified: Secondary | ICD-10-CM | POA: Diagnosis not present

## 2013-09-23 DIAGNOSIS — E119 Type 2 diabetes mellitus without complications: Secondary | ICD-10-CM | POA: Insufficient documentation

## 2013-09-23 DIAGNOSIS — S79919A Unspecified injury of unspecified hip, initial encounter: Secondary | ICD-10-CM | POA: Insufficient documentation

## 2013-09-23 DIAGNOSIS — S8000XA Contusion of unspecified knee, initial encounter: Secondary | ICD-10-CM | POA: Diagnosis not present

## 2013-09-23 DIAGNOSIS — E876 Hypokalemia: Secondary | ICD-10-CM | POA: Diagnosis not present

## 2013-09-23 DIAGNOSIS — S99919A Unspecified injury of unspecified ankle, initial encounter: Secondary | ICD-10-CM | POA: Insufficient documentation

## 2013-09-23 DIAGNOSIS — M069 Rheumatoid arthritis, unspecified: Secondary | ICD-10-CM | POA: Insufficient documentation

## 2013-09-23 DIAGNOSIS — Z8669 Personal history of other diseases of the nervous system and sense organs: Secondary | ICD-10-CM | POA: Insufficient documentation

## 2013-09-23 DIAGNOSIS — R296 Repeated falls: Secondary | ICD-10-CM | POA: Insufficient documentation

## 2013-09-23 DIAGNOSIS — M25361 Other instability, right knee: Secondary | ICD-10-CM

## 2013-09-23 LAB — CBC WITH DIFFERENTIAL/PLATELET
Eosinophils Absolute: 0.3 10*3/uL (ref 0.0–0.7)
Hemoglobin: 13.7 g/dL (ref 12.0–15.0)
Lymphocytes Relative: 17 % (ref 12–46)
Lymphs Abs: 2.1 10*3/uL (ref 0.7–4.0)
Monocytes Relative: 7 % (ref 3–12)
Neutro Abs: 9.2 10*3/uL — ABNORMAL HIGH (ref 1.7–7.7)
Neutrophils Relative %: 74 % (ref 43–77)
Platelets: 318 10*3/uL (ref 150–400)
RBC: 4.58 MIL/uL (ref 3.87–5.11)
WBC: 12.5 10*3/uL — ABNORMAL HIGH (ref 4.0–10.5)

## 2013-09-23 LAB — URINE MICROSCOPIC-ADD ON

## 2013-09-23 LAB — BASIC METABOLIC PANEL
CO2: 26 mEq/L (ref 19–32)
Chloride: 97 mEq/L (ref 96–112)
GFR calc non Af Amer: >90 mL/min (ref >90)
Glucose, Bld: 246 mg/dL — ABNORMAL HIGH (ref 70–99)
Potassium: 3.1 mEq/L — ABNORMAL LOW (ref 3.5–5.1)
Sodium: 138 mEq/L (ref 135–145)

## 2013-09-23 LAB — URINALYSIS, ROUTINE W REFLEX MICROSCOPIC
Hgb urine dipstick: NEGATIVE
Nitrite: NEGATIVE
Specific Gravity, Urine: 1.015 (ref 1.005–1.030)
Urobilinogen, UA: 0.2 mg/dL (ref 0.0–1.0)
pH: 7.5 (ref 5.0–8.0)

## 2013-09-23 MED ORDER — POTASSIUM CHLORIDE CRYS ER 20 MEQ PO TBCR: 20.0000 meq | EXTENDED_RELEASE_TABLET | Freq: Two times a day (BID) | ORAL | Status: DC

## 2013-09-23 NOTE — ED Notes (Signed)
Pt states multiple falls recently. States pain to bend of right knee and right hip since falling.

## 2013-09-23 NOTE — ED Provider Notes (Signed)
CSN: [id number]     Arrival date & time 09/23/13  1413 History   First MD Initiated Contact with Patient 09/23/13 1636    Scribed for No att. providers found, the patient was seen in room APA01/APA01. This chart was scribed by Denice Bors, ED scribe. Patient's care was started at 9:42 PM  Chief Complaint  Patient presents with  . Knee Pain  . Hip Pain   (Consider location/radiation/quality/duration/timing/severity/associated sxs/prior Treatment) The history is provided by the patient. No language interpreter was used.   HPI Comments: [name] [name] is a 67 y.o. female who presents to the Emergency Department complaining of frequent falls secondary to right knee "giving out" each time onset 7 days. Reports associated worsening intermittent right knee pain, and right hip pain. Additionally, reports mild, dull ache of right foot. Reports feeling "pop" behind her right knee the first time she fell in the garage a week ago. Reports right hip pain and right knee pain is exacerbated by walking and alleviated at rest.  She also states she has had swelling and bruising of her right foot and toes. She however, has no pain when she puts on her shoes. Denies associated head injuries, headaches, nausea, emesis, chest pain, rib pain, abdominal pain, arm pain, left hip pain, fever, and neck pain.  Denies smoking cigarettes. Reports PMHx of bursitis in right hip.    Reports she has an appointment with Dr. Michela Pitcher Urologist for "twisted bladder tube"    PCP Dr. Gerarda Fraction  Past Medical History  Diagnosis Date  . Diabetes mellitus   . Hypertension   . Thyroid disease   . Arthritis   . Hypothyroidism   . Diverticulitis   . Glaucoma   . Hyperlipidemia   . Rheumatoid arthritis(714.0)   . Osteoarthritis   . Heart murmur   . Aortic stenosis   . Melanoma     left thigh, 2010  . Bronchitis   . Blood transfusion without reported diagnosis    Past Surgical History  Procedure Laterality Date  . Foot  surgery  1994    left   . Abdominal hysterectomy  1988  . Cholecystectomy  1974  . Back surgery  07/2008    fusion  . Appendectomy    . Colonoscopy  06/06/2007    Proximal diminutive rectal polyps cold biopsied/removed, otherwise normal rectum/ Left-sided diverticula, the remainder of the colonic mucosa appeared normal. Path-tubular adnemoa.   . Back surgery  [id number]    complicated by post-op anemia requiring transfusion  . Melanoma excision  2010  . Eye surgery    . Macular degen     Family History  Problem Relation Age of Onset  . Anesthesia problems Neg Hx   . Colon cancer Brother     diagnosed at age 39   History  Substance Use Topics  . Smoking status: Never Smoker   . Smokeless tobacco: Not on file  . Alcohol Use: No     Comment: "Wine a couple times a month"   Lives at home Lives with daughters and granddaughters Uses a walker   OB History   Grav Para Term Preterm Abortions TAB SAB Ect Mult Living                 Review of Systems  Musculoskeletal: Positive for arthralgias.  All other systems reviewed and are negative.  A complete 10 system review of systems was obtained and all systems are negative except as noted in the HPI and PMHx.  Allergies  Review of patient's allergies indicates no known allergies.  Home Medications   Current Outpatient Rx  Name  Route  Sig  Dispense  Refill  . amLODipine (NORVASC) 10 MG tablet   Oral   Take 10 mg by mouth daily.           . Ascorbic Acid (VITAMIN C PO)   Oral   Take 1 tablet by mouth daily.          Marland Kitchen aspirin EC 325 MG tablet   Oral   Take 325 mg by mouth daily.         Marland Kitchen azithromycin (ZITHROMAX) 250 MG tablet   Oral   Take 1 tablet (250 mg total) by mouth daily. Take first 2 tablets together, then 1 every day until finished.   6 tablet   0   . B Complex-C (B-COMPLEX WITH VITAMIN C) tablet   Oral   Take 1 tablet by mouth daily.           Marland Kitchen CALCIUM PO   Oral   Take 600 mg by mouth  every evening.          . Cholecalciferol (CVS VIT D 5000 HIGH-POTENCY) 5000 UNITS capsule   Oral   Take 5,000 Units by mouth daily.         . cimetidine (TAGAMET) 200 MG tablet   Oral   Take 200 mg by mouth daily as needed.         . enalapril (VASOTEC) 20 MG tablet   Oral   Take 20 mg by mouth daily.         . Fiber CAPS   Oral   Take 1 capsule by mouth daily.         . Ginkgo Biloba 60 MG CAPS   Oral   Take 1 capsule by mouth daily.         Marland Kitchen glipiZIDE (GLUCOTROL XL) 5 MG 24 hr tablet   Oral   Take 5 mg by mouth daily.           . hydrochlorothiazide 25 MG tablet   Oral   Take 12.5 mg by mouth daily.          Marland Kitchen HYDROcodone-acetaminophen (NORCO/VICODIN) 5-325 MG per tablet   Oral   Take 1-2 tablets by mouth every 6 (six) hours as needed for pain.   20 tablet   0   . HYDROcodone-acetaminophen (VICODIN) 5-500 MG per tablet   Oral   Take 1 tablet by mouth every 6 (six) hours as needed. For pain          . latanoprost (XALATAN) 0.005 % ophthalmic solution   Both Eyes   Place 1 drop into both eyes at bedtime.           Marland Kitchen levothyroxine (SYNTHROID, LEVOTHROID) 100 MCG tablet   Oral   Take 100 mcg by mouth daily.         . metFORMIN (GLUMETZA) 1000 MG (MOD) 24 hr tablet   Oral   Take 1,000 mg by mouth 2 (two) times daily with a meal.         . Nutritional Supplements (EQ ESTROBLEND MENOPAUSE) TABS   Oral   Take 1 tablet by mouth daily.         . Nutritional Supplements (MELATONIN PO)   Oral   Take 2 tablets by mouth at bedtime.         Marland Kitchen omega-3 acid ethyl esters (LOVAZA)  1 G capsule   Oral   Take 1 g by mouth daily.          . potassium chloride SA (K-DUR,KLOR-CON) 20 MEQ tablet   Oral   Take 20 mEq by mouth daily.          . predniSONE (DELTASONE) 50 MG tablet      One tablet PO daily   4 tablet   0   . Prenatal Vit-Fe Fumarate-FA (PRENATAL MULTIVITAMIN) TABS   Oral   Take 1 tablet by mouth daily.         .  simvastatin (ZOCOR) 20 MG tablet   Oral   Take 20 mg by mouth at bedtime.           . sodium chloride (OCEAN) 0.65 % nasal spray   Nasal   Place 1 spray into the nose as needed.         . vitamin E 400 UNIT capsule   Oral   Take 400 Units by mouth daily.          BP 171/71  Pulse 86  Temp(Src) 98 F (36.7 C) (Oral)  Resp 20  Ht 4\' 9"  (1.448 m)  Wt 123 lb (55.792 kg)  BMI 26.61 kg/m2  SpO2 100%  Vital signs normal except hypertension  Physical Exam  Nursing note and vitals reviewed. Constitutional: She is oriented to person, place, and time. She appears well-developed and well-nourished.  Non-toxic appearance. She does not appear ill. No distress.  HENT:  Head: Normocephalic and atraumatic.  Right Ear: External ear normal.  Left Ear: External ear normal.  Nose: Nose normal. No mucosal edema or rhinorrhea.  Mouth/Throat: Oropharynx is clear and moist and mucous membranes are normal. No dental abscesses or uvula swelling.  Eyes: Conjunctivae and EOM are normal. Pupils are equal, round, and reactive to light.  Neck: Normal range of motion and full passive range of motion without pain. Neck supple.  Cardiovascular: Normal rate, regular rhythm and normal heart sounds.  Exam reveals no gallop and no friction rub.   No murmur heard. Pulses:      Dorsalis pedis pulses are 2+ on the right side.       Posterior tibial pulses are 2+ on the right side.  Pulmonary/Chest: Effort normal and breath sounds normal. No respiratory distress. She has no wheezes. She has no rhonchi. She has no rales. She exhibits no tenderness and no crepitus.  Abdominal: Soft. Normal appearance and bowel sounds are normal. She exhibits no distension. There is no tenderness. There is no rebound and no guarding.  Musculoskeletal: Normal range of motion. She exhibits no edema and no tenderness.  Moves all extremities well.   Right foot: Bruising and swelling of the dorsum of right foot centered over the  metatarsals of the the middle toes. Middle toe seems to be angled medially. Second toe is angled superiorly.  Old round bruise on right anterolateral knee with no joint effusion. Excellent ROM of right hip and right knee without pain     Neurological: She is alert and oriented to person, place, and time. She has normal strength. No cranial nerve deficit.  Skin: Skin is warm, dry and intact. No rash noted. No erythema. No pallor.  Psychiatric: She has a normal mood and affect. Her speech is normal and behavior is normal. Her mood appears not anxious.    ED Course  Procedures (including critical care time)  COORDINATION OF CARE:  Nursing notes reviewed. Vital signs  reviewed. Initial pt interview and examination performed.   -Discussed work up plan with pt at bedside, which includes knee immobilizer on right knee, CBC with diff panel, and BMP and  until she follows up with orthopedist. Pt agrees with plan.  Pt placed in a knee immobilizer, she states she has a walker she can use or use a cane.    Nursing Notes Reviewed/ Care Coordinated Applicable Imaging Reviewed  Discussed results and treatment plan with pt. Pt demonstrates understanding and agrees with plan.   Initial diagnostic testing ordered.    Labs Review Results for orders placed during the hospital encounter of 09/23/13  CBC WITH DIFFERENTIAL      Result Value Range   WBC 12.5 (*) 4.0 - 10.5 K/uL   RBC 4.58  3.87 - 5.11 MIL/uL   Hemoglobin 13.7  12.0 - 15.0 g/dL   HCT 39.7  36.0 - 46.0 %   MCV 86.7  78.0 - 100.0 fL   MCH 29.9  26.0 - 34.0 pg   MCHC 34.5  30.0 - 36.0 g/dL   RDW 13.8  11.5 - 15.5 %   Platelets 318  150 - 400 K/uL   Neutrophils Relative % 74  43 - 77 %   Neutro Abs 9.2 (*) 1.7 - 7.7 K/uL   Lymphocytes Relative 17  12 - 46 %   Lymphs Abs 2.1  0.7 - 4.0 K/uL   Monocytes Relative 7  3 - 12 %   Monocytes Absolute 0.8  0.1 - 1.0 K/uL   Eosinophils Relative 2  0 - 5 %   Eosinophils Absolute 0.3  0.0 - 0.7  K/uL   Basophils Relative 1  0 - 1 %   Basophils Absolute 0.1  0.0 - 0.1 K/uL  BASIC METABOLIC PANEL      Result Value Range   Sodium 138  135 - 145 mEq/L   Potassium 3.1 (*) 3.5 - 5.1 mEq/L   Chloride 97  96 - 112 mEq/L   CO2 26  19 - 32 mEq/L   Glucose, Bld 246 (*) 70 - 99 mg/dL   BUN 11  6 - 23 mg/dL   Creatinine, Ser 0.59  0.50 - 1.10 mg/dL   Calcium 9.8  8.4 - 10.5 mg/dL   GFR calc non Af Amer >90  >90 mL/min   GFR calc Af Amer >90  >90 mL/min  URINALYSIS, ROUTINE W REFLEX MICROSCOPIC      Result Value Range   Color, Urine YELLOW  YELLOW   APPearance CLEAR  CLEAR   Specific Gravity, Urine 1.015  1.005 - 1.030   pH 7.5  5.0 - 8.0   Glucose, UA 100 (*) NEGATIVE mg/dL   Hgb urine dipstick NEGATIVE  NEGATIVE   Bilirubin Urine NEGATIVE  NEGATIVE   Ketones, ur NEGATIVE  NEGATIVE mg/dL   Protein, ur 30 (*) NEGATIVE mg/dL   Urobilinogen, UA 0.2  0.0 - 1.0 mg/dL   Nitrite NEGATIVE  NEGATIVE   Leukocytes, UA NEGATIVE  NEGATIVE  URINE MICROSCOPIC-ADD ON      Result Value Range   WBC, UA 3-6  <3 WBC/hpf   Laboratory interpretation all normal except leukocytosis, hypokalemia   Imaging Review Dg Hip Complete Right  09/23/2013   CLINICAL DATA:  Hip pain. Multiple recent history discretion of multiple recent falls.  EXAM: RIGHT HIP - COMPLETE 2+ VIEW  COMPARISON:  MRI of the hip 12/10/2011.  FINDINGS: AP view of the pelvis and AP and lateral views  of the right hip demonstrate no definite acute displaced fracture, subluxation, dislocation, joint or soft tissue abnormality. Mild degenerative changes of osteoarthritis are noted in the hip joints bilaterally. Orthopedic fixation hardware is noted at the lumbosacral junction.  IMPRESSION: 1. No acute radiographic abnormality of the bony pelvis or the right hip. 2. Mild bilateral hip joint osteoarthritis.   Electronically Signed   By: Vinnie Langton M.D.   On: 09/23/2013 17:01   Dg Knee Complete 4 Views Right  09/23/2013   CLINICAL DATA:   Bruising and swelling lateral and inferior to the patella. Multiple recent falls.  EXAM: RIGHT KNEE - COMPLETE 4+ VIEW  COMPARISON:  No priors.  FINDINGS: Four views of the right knee demonstrate no acute displaced fracture, subluxation, dislocation, joint or soft tissue abnormality.  IMPRESSION: 1. No acute radiographic abnormality of the right knee.   Electronically Signed   By: Vinnie Langton M.D.   On: 09/23/2013 17:02   Dg Foot Complete Right  09/23/2013   CLINICAL DATA:  Bruising and swelling across the top of the metatarsals. Multiple recent falls.  EXAM: RIGHT FOOT COMPLETE - 3+ VIEW  COMPARISON:  No priors.  FINDINGS: Multiple views of the right foot demonstrate no acute displaced fracture, subluxation, dislocation, or soft tissue abnormality.  IMPRESSION: No acute radiographic abnormality of the right foot.   Electronically Signed   By: Vinnie Langton M.D.   On: 09/23/2013 17:03      MDM   1. Falls frequently   2. Knee gives out, right   3. Hypokalemia    Discharge medications potassium chloride 20 meq BID x 8 doses  Plan discharge  Rolland Porter, MD, FACEP    .  I personally performed the services described in this documentation, which was scribed in my presence. The recorded information has been reviewed and considered.  Rolland Porter, MD, Abram Sander   Janice Norrie, MD 09/23/13 2144

## 2013-09-27 ENCOUNTER — Telehealth: Payer: Self-pay | Admitting: Orthopedic Surgery

## 2013-09-27 NOTE — Telephone Encounter (Signed)
Patient called following Emergency Room visit at [facility]; states has been referred to our office for right knee pain due to falling - states has fallen 8 times in the past week; states had Xrays and was placed in knee brace.  States right hip and right foot also checked and Xrayed, but "no broken bones" patient said.  She states she is also scheduled, as per Emergency room, with primary care physician, Dr. Gerarda Fraction.  Please review and advise if okay to schedule into next available slot (Monday)?  Patient ph# is [phone].

## 2013-09-28 NOTE — Telephone Encounter (Signed)
c primary care 1st

## 2013-09-28 NOTE — Telephone Encounter (Signed)
Called patient; relayed.  She understands and states that her primary care appointment is 10/05/13.

## 2013-10-05 ENCOUNTER — Other Ambulatory Visit (HOSPITAL_COMMUNITY): Payer: Self-pay | Admitting: Family Medicine

## 2013-10-05 DIAGNOSIS — I10 Essential (primary) hypertension: Secondary | ICD-10-CM | POA: Diagnosis not present

## 2013-10-05 DIAGNOSIS — Z6839 Body mass index (BMI) 39.0-39.9, adult: Secondary | ICD-10-CM | POA: Diagnosis not present

## 2013-10-05 DIAGNOSIS — Z0000 Encounter for general adult medical examination without abnormal findings: Secondary | ICD-10-CM

## 2013-10-05 DIAGNOSIS — E785 Hyperlipidemia, unspecified: Secondary | ICD-10-CM | POA: Diagnosis not present

## 2013-10-10 ENCOUNTER — Ambulatory Visit (HOSPITAL_COMMUNITY)
Admission: RE | Admit: 2013-10-10 | Discharge: 2013-10-10 | Disposition: A | Discharge status: Home / Self Care | Payer: Medicare Other | Source: Ambulatory Visit | Attending: Family Medicine | Admitting: Family Medicine

## 2013-10-10 DIAGNOSIS — E785 Hyperlipidemia, unspecified: Secondary | ICD-10-CM | POA: Insufficient documentation

## 2013-10-10 DIAGNOSIS — I658 Occlusion and stenosis of other precerebral arteries: Secondary | ICD-10-CM | POA: Insufficient documentation

## 2013-10-10 DIAGNOSIS — I6529 Occlusion and stenosis of unspecified carotid artery: Secondary | ICD-10-CM | POA: Insufficient documentation

## 2013-10-10 DIAGNOSIS — N390 Urinary tract infection, site not specified: Secondary | ICD-10-CM | POA: Diagnosis not present

## 2013-10-10 DIAGNOSIS — I10 Essential (primary) hypertension: Secondary | ICD-10-CM | POA: Diagnosis not present

## 2013-10-10 DIAGNOSIS — Z0000 Encounter for general adult medical examination without abnormal findings: Secondary | ICD-10-CM

## 2013-10-10 DIAGNOSIS — R32 Unspecified urinary incontinence: Secondary | ICD-10-CM | POA: Diagnosis not present

## 2013-10-13 DIAGNOSIS — N390 Urinary tract infection, site not specified: Secondary | ICD-10-CM | POA: Diagnosis not present

## 2013-10-13 DIAGNOSIS — R32 Unspecified urinary incontinence: Secondary | ICD-10-CM | POA: Diagnosis not present

## 2013-10-13 DIAGNOSIS — Q638 Other specified congenital malformations of kidney: Secondary | ICD-10-CM | POA: Diagnosis not present

## 2013-10-18 DIAGNOSIS — N35919 Unspecified urethral stricture, male, unspecified site: Secondary | ICD-10-CM | POA: Diagnosis not present

## 2013-10-31 ENCOUNTER — Encounter: Payer: Self-pay | Admitting: Orthopedic Surgery

## 2013-10-31 ENCOUNTER — Ambulatory Visit (INDEPENDENT_AMBULATORY_CARE_PROVIDER_SITE_OTHER): Payer: Medicare Other | Admitting: Orthopedic Surgery

## 2013-10-31 VITALS — BP 164/66 | Ht <= 58 in | Wt 133.0 lb

## 2013-10-31 DIAGNOSIS — R29898 Other symptoms and signs involving the musculoskeletal system: Secondary | ICD-10-CM | POA: Diagnosis not present

## 2013-10-31 NOTE — Progress Notes (Signed)
Patient ID: [name] [name], female   DOB: August 23, 1946, 67 y.o.   MRN: [id number]  Chief Complaint  Patient presents with  . Knee Pain    Right knee and leg pain, knee giving out. Referred by Dr. Gerarda Fraction    History of present illness: The patient reports injuring her right knee in September, 27th. She stumbled out of a garage and fell onto her right side. Since that time she's had 13 episodes of the right leg giving way and she complains of a clicking sensation in the patellofemoral joint. She complains of dull throbbing 6/10 constant knee pain which is worse with walking and bending her knee. Her pain is improved by straight leg knee immobilizer. The pain is rated 6/10 described as constant dull throbbing pain.  Review of systems history of fatigue heartburn diverticulitis constipation depression dry skin easy bruising excessive thirst and seasonal allergies with joint pain swelling and instability muscle pain noted. Past Medical History  Diagnosis Date  . Diabetes mellitus   . Hypertension   . Thyroid disease   . Arthritis   . Hypothyroidism   . Diverticulitis   . Glaucoma   . Hyperlipidemia   . Rheumatoid arthritis(714.0)   . Osteoarthritis   . Heart murmur   . Aortic stenosis   . Melanoma     left thigh, 2010  . Bronchitis   . Blood transfusion without reported diagnosis    Past Surgical History  Procedure Laterality Date  . Foot surgery  1994    left   . Abdominal hysterectomy  1988  . Cholecystectomy  1974  . Back surgery  07/2008    fusion  . Appendectomy    . Colonoscopy  06/06/2007    Proximal diminutive rectal polyps cold biopsied/removed, otherwise normal rectum/ Left-sided diverticula, the remainder of the colonic mucosa appeared normal. Path-tubular adnemoa.   . Back surgery  [id number]    complicated by post-op anemia requiring transfusion  . Melanoma excision  2010  . Eye surgery    . Macular degen      BP 164/66  Ht 4\' 9"  (1.448 m)  Wt 133 lb (60.328 kg)   BMI 28.77 kg/m2 General appearance is normal, the patient is alert and oriented x3 with normal mood and affect. She is ambulating with a brace and supportive device  Her knee ligaments are stable she has full range of motion passively she has weakness of her proximal hip flexors and knee extensors skin is normal there is no joint line tenderness there is painful crepitance in the patellofemoral joint there is no joint effusion Strong pulses noted normal sensation is noted in the right knee area  Upper extremities normal appearance without contracture subluxation atrophy or tremor  Knee hip and foot x-rays show arthritis in the foot no acute problems in the knee or hip previous films show no knee or hip significant pathology  She has a history of lumbar fusion  She most likely has weakness of her leg related to her lumbar disease  Recommend hinged knee brace, therapy to address her leg weakness. If she's not better when she comes back we can image the knee with MRI but clinically she appears to have weakness of her right lower extremity related to lumbar disc disease

## 2013-11-01 DIAGNOSIS — N35919 Unspecified urethral stricture, male, unspecified site: Secondary | ICD-10-CM | POA: Diagnosis not present

## 2013-11-17 DIAGNOSIS — H353190 Nonexudative age-related macular degeneration, unspecified eye, stage unspecified: Secondary | ICD-10-CM | POA: Diagnosis not present

## 2013-11-17 DIAGNOSIS — R262 Difficulty in walking, not elsewhere classified: Secondary | ICD-10-CM | POA: Diagnosis not present

## 2013-11-17 DIAGNOSIS — M1710 Unilateral primary osteoarthritis, unspecified knee: Secondary | ICD-10-CM | POA: Diagnosis not present

## 2013-11-20 DIAGNOSIS — M1710 Unilateral primary osteoarthritis, unspecified knee: Secondary | ICD-10-CM | POA: Diagnosis not present

## 2013-11-20 DIAGNOSIS — R262 Difficulty in walking, not elsewhere classified: Secondary | ICD-10-CM | POA: Diagnosis not present

## 2013-11-23 DIAGNOSIS — R262 Difficulty in walking, not elsewhere classified: Secondary | ICD-10-CM | POA: Diagnosis not present

## 2013-11-23 DIAGNOSIS — M1710 Unilateral primary osteoarthritis, unspecified knee: Secondary | ICD-10-CM | POA: Diagnosis not present

## 2013-12-01 DIAGNOSIS — R262 Difficulty in walking, not elsewhere classified: Secondary | ICD-10-CM | POA: Diagnosis not present

## 2013-12-01 DIAGNOSIS — M1710 Unilateral primary osteoarthritis, unspecified knee: Secondary | ICD-10-CM | POA: Diagnosis not present

## 2013-12-11 ENCOUNTER — Encounter (HOSPITAL_COMMUNITY): Payer: Self-pay | Admitting: Emergency Medicine

## 2013-12-11 ENCOUNTER — Emergency Department (HOSPITAL_COMMUNITY): Payer: Medicare Other

## 2013-12-11 ENCOUNTER — Emergency Department (HOSPITAL_COMMUNITY)
Admission: EM | Admit: 2013-12-11 | Discharge: 2013-12-12 | Disposition: A | Discharge status: Home / Self Care | Payer: Medicare Other | Attending: Emergency Medicine | Admitting: Emergency Medicine

## 2013-12-11 DIAGNOSIS — Z85820 Personal history of malignant melanoma of skin: Secondary | ICD-10-CM | POA: Diagnosis not present

## 2013-12-11 DIAGNOSIS — E785 Hyperlipidemia, unspecified: Secondary | ICD-10-CM | POA: Diagnosis not present

## 2013-12-11 DIAGNOSIS — I10 Essential (primary) hypertension: Secondary | ICD-10-CM | POA: Diagnosis not present

## 2013-12-11 DIAGNOSIS — R6889 Other general symptoms and signs: Secondary | ICD-10-CM | POA: Diagnosis not present

## 2013-12-11 DIAGNOSIS — R05 Cough: Secondary | ICD-10-CM | POA: Diagnosis not present

## 2013-12-11 DIAGNOSIS — Z8719 Personal history of other diseases of the digestive system: Secondary | ICD-10-CM | POA: Insufficient documentation

## 2013-12-11 DIAGNOSIS — E119 Type 2 diabetes mellitus without complications: Secondary | ICD-10-CM | POA: Diagnosis not present

## 2013-12-11 DIAGNOSIS — H6690 Otitis media, unspecified, unspecified ear: Secondary | ICD-10-CM | POA: Insufficient documentation

## 2013-12-11 DIAGNOSIS — J3489 Other specified disorders of nose and nasal sinuses: Secondary | ICD-10-CM | POA: Diagnosis not present

## 2013-12-11 DIAGNOSIS — H9319 Tinnitus, unspecified ear: Secondary | ICD-10-CM | POA: Diagnosis not present

## 2013-12-11 DIAGNOSIS — R059 Cough, unspecified: Secondary | ICD-10-CM

## 2013-12-11 DIAGNOSIS — R509 Fever, unspecified: Secondary | ICD-10-CM | POA: Diagnosis not present

## 2013-12-11 DIAGNOSIS — H65199 Other acute nonsuppurative otitis media, unspecified ear: Secondary | ICD-10-CM | POA: Diagnosis not present

## 2013-12-11 DIAGNOSIS — Z79899 Other long term (current) drug therapy: Secondary | ICD-10-CM | POA: Diagnosis not present

## 2013-12-11 DIAGNOSIS — M1990 Unspecified osteoarthritis, unspecified site: Secondary | ICD-10-CM | POA: Diagnosis not present

## 2013-12-11 DIAGNOSIS — R011 Cardiac murmur, unspecified: Secondary | ICD-10-CM | POA: Insufficient documentation

## 2013-12-11 DIAGNOSIS — H6692 Otitis media, unspecified, left ear: Secondary | ICD-10-CM

## 2013-12-11 DIAGNOSIS — Z7982 Long term (current) use of aspirin: Secondary | ICD-10-CM | POA: Diagnosis not present

## 2013-12-11 DIAGNOSIS — E039 Hypothyroidism, unspecified: Secondary | ICD-10-CM | POA: Diagnosis not present

## 2013-12-11 DIAGNOSIS — R0602 Shortness of breath: Secondary | ICD-10-CM | POA: Diagnosis not present

## 2013-12-11 DIAGNOSIS — M069 Rheumatoid arthritis, unspecified: Secondary | ICD-10-CM | POA: Insufficient documentation

## 2013-12-11 DIAGNOSIS — R42 Dizziness and giddiness: Secondary | ICD-10-CM | POA: Diagnosis not present

## 2013-12-11 MED ORDER — AMOXICILLIN 500 MG PO CAPS: 500.0000 mg | ORAL_CAPSULE | Freq: Three times a day (TID) | ORAL | Status: DC

## 2013-12-11 MED ORDER — ACETAMINOPHEN 325 MG PO TABS
650.0000 mg | ORAL_TABLET | Freq: Once | ORAL | Status: AC
  Administered 2013-12-11: 650 mg via ORAL
  Filled 2013-12-11: qty 2

## 2013-12-11 NOTE — ED Provider Notes (Signed)
CSN: [id number]     Arrival date & time 12/11/13  2214 History  This chart was scribed for [name] Cable, MD by Marlowe Kays, ED Scribe. This patient was seen in room APA10/APA10 and the patient's care was started at 11:10 PM.  Chief Complaint  Patient presents with  . Cough   HPI HPI Comments:  [name] [name] is a 67 y.o. female, brought in by ambulance, who presents to the Emergency Department complaining of productive cough of thick, green phlegm onset one week. Pt reports associated ear pain, tinnitus, and sinus pressure. She states she had a fever approximately four days ago that has since resolved. She denies vomiting or diarrhea.  No cp reported No SOB reported Her course is worsening Nothing improves symptoms   Past Medical History  Diagnosis Date  . Diabetes mellitus   . Hypertension   . Thyroid disease   . Arthritis   . Hypothyroidism   . Diverticulitis   . Glaucoma   . Hyperlipidemia   . Rheumatoid arthritis(714.0)   . Osteoarthritis   . Heart murmur   . Aortic stenosis   . Bronchitis   . Blood transfusion without reported diagnosis   . Melanoma     left thigh, 2010   Past Surgical History  Procedure Laterality Date  . Foot surgery  1994    left   . Abdominal hysterectomy  1988  . Cholecystectomy  1974  . Back surgery  07/2008    fusion  . Appendectomy    . Colonoscopy  06/06/2007    Proximal diminutive rectal polyps cold biopsied/removed, otherwise normal rectum/ Left-sided diverticula, the remainder of the colonic mucosa appeared normal. Path-tubular adnemoa.   . Back surgery  [id number]    complicated by post-op anemia requiring transfusion  . Melanoma excision  2010  . Eye surgery    . Macular degen     Family History  Problem Relation Age of Onset  . Anesthesia problems Neg Hx   . Colon cancer Brother     diagnosed at age 28   History  Substance Use Topics  . Smoking status: Never Smoker   . Smokeless tobacco: Not on file  . Alcohol  Use: No     Comment: "Wine a couple times a month"   OB History   Grav Para Term Preterm Abortions TAB SAB Ect Mult Living                 Review of Systems  HENT: Positive for congestion, ear pain, sinus pressure and tinnitus.   Respiratory: Positive for cough.   All other systems reviewed and are negative.    Allergies  Review of patient's allergies indicates no known allergies.  Home Medications   Current Outpatient Rx  Name  Route  Sig  Dispense  Refill  . amLODipine (NORVASC) 10 MG tablet   Oral   Take 10 mg by mouth daily.           Marland Kitchen aspirin EC 325 MG tablet   Oral   Take 325 mg by mouth daily.         . B Complex-C (B-COMPLEX WITH VITAMIN C) tablet   Oral   Take 1 tablet by mouth daily.           Marland Kitchen CALCIUM PO   Oral   Take 600 mg by mouth every evening.          . Cholecalciferol (CVS VIT D 5000 HIGH-POTENCY) 5000 UNITS capsule  Oral   Take 5,000 Units by mouth daily.         . cimetidine (TAGAMET) 200 MG tablet   Oral   Take 200 mg by mouth daily as needed.         . enalapril (VASOTEC) 20 MG tablet   Oral   Take 20 mg by mouth daily.         . Fiber CAPS   Oral   Take 1 capsule by mouth daily.         . Ginkgo Biloba 60 MG CAPS   Oral   Take 1 capsule by mouth daily.         Marland Kitchen glipiZIDE (GLUCOTROL) 10 MG tablet   Oral   Take 10 mg by mouth 2 (two) times daily before a meal.         . hydrochlorothiazide 25 MG tablet   Oral   Take 12.5 mg by mouth daily.          Marland Kitchen latanoprost (XALATAN) 0.005 % ophthalmic solution   Both Eyes   Place 1 drop into both eyes at bedtime.           Marland Kitchen levothyroxine (SYNTHROID, LEVOTHROID) 100 MCG tablet   Oral   Take 100 mcg by mouth daily.         . Nutritional Supplements (EQ ESTROBLEND MENOPAUSE) TABS   Oral   Take 1 tablet by mouth daily.         Marland Kitchen omega-3 acid ethyl esters (LOVAZA) 1 G capsule   Oral   Take 1 g by mouth daily.          . potassium chloride SA  (K-DUR,KLOR-CON) 20 MEQ tablet   Oral   Take 20 mEq by mouth daily.          . potassium chloride SA (K-DUR,KLOR-CON) 20 MEQ tablet   Oral   Take 1 tablet (20 mEq total) by mouth 2 (two) times daily.   8 tablet   0   . Prenatal Vit-Fe Fumarate-FA (PRENATAL MULTIVITAMIN) TABS   Oral   Take 1 tablet by mouth daily.         . simvastatin (ZOCOR) 20 MG tablet   Oral   Take 20 mg by mouth at bedtime.           . vitamin C (ASCORBIC ACID) 500 MG tablet   Oral   Take 500 mg by mouth daily.         . vitamin E 400 UNIT capsule   Oral   Take 400 Units by mouth daily.          Triage Vitals: BP 165/60  Pulse 92  Temp(Src) 97.7 F (36.5 C) (Oral)  Resp 20  Ht 4\' 9"  (1.448 m)  Wt 128 lb (58.06 kg)  BMI 27.69 kg/m2  SpO2 98% Physical Exam CONSTITUTIONAL: Well developed/well nourished HEAD: Normocephalic/atraumatic EYES: EOMI/PERRL ENMT: Mucous membranes moist; right cerumen impaction; left TM erythematous and bulging. Uvula midline NECK: supple no meningeal signs SPINE:entire spine nontender CV: S1/S2 noted, murmur noted LUNGS: Lungs are clear to auscultation bilaterally, no apparent distress ABDOMEN: soft, nontender, no rebound or guarding GU:no cva tenderness NEURO: Pt is awake/alert, moves all extremitiesx4 EXTREMITIES: pulses normal, full ROM SKIN: warm, color normal PSYCH: no abnormalities of mood noted  ED Course  Procedures (including critical care time) DIAGNOSTIC STUDIES: Oxygen Saturation is 98% on RA, normal by my interpretation.   COORDINATION OF CARE: 11:14 PM-Will prescribe antibiotics and  discharge home.  Pt verbalizes understanding and agrees to plan.  Medications  acetaminophen (TYLENOL) tablet 650 mg (not administered)   Labs Review Labs Reviewed - No data to display Imaging Review Dg Chest 2 View  12/11/2013   CLINICAL DATA:  Cough, shortness of breath  EXAM: CHEST  2 VIEW  COMPARISON:  05/06/2012  FINDINGS: The heart size and  mediastinal contours are within normal limits. Both lungs are clear. The visualized skeletal structures are unremarkable.  IMPRESSION: No active cardiopulmonary disease.   Electronically Signed   By: Kathreen Devoid   On: 12/11/2013 22:50    EKG Interpretation   None       MDM  No diagnosis found. Nursing notes including past medical history and social history reviewed and considered in documentation xrays reviewed and considered   I personally performed the services described in this documentation, which was scribed in my presence. The recorded information has been reviewed and is accurate.     [name] Cable, MD 12/12/13 [phone]

## 2013-12-11 NOTE — ED Notes (Signed)
States she has been sick since Christmas. - Head "stopped up"  Taking Mucinex - today began having ear pain - generalized aches also.  States has been having pain in face, teeth, all over

## 2013-12-11 NOTE — ED Notes (Signed)
Cough with green sputum , Pain rt ear.  "My head is stopped up"

## 2013-12-12 NOTE — ED Notes (Signed)
Attempting to contact her daughter for transportation home.

## 2014-01-02 ENCOUNTER — Ambulatory Visit: Payer: Medicare Other | Admitting: Orthopedic Surgery

## 2014-01-02 ENCOUNTER — Telehealth: Payer: Self-pay | Admitting: Orthopedic Surgery

## 2014-01-02 NOTE — Telephone Encounter (Signed)
Patient cancelled 01/02/14 appointment, said she is much better does not want to reschedule

## 2014-01-10 DIAGNOSIS — J019 Acute sinusitis, unspecified: Secondary | ICD-10-CM | POA: Diagnosis not present

## 2014-01-10 DIAGNOSIS — I10 Essential (primary) hypertension: Secondary | ICD-10-CM | POA: Diagnosis not present

## 2014-01-10 DIAGNOSIS — Z6827 Body mass index (BMI) 27.0-27.9, adult: Secondary | ICD-10-CM | POA: Diagnosis not present

## 2014-01-10 DIAGNOSIS — G8929 Other chronic pain: Secondary | ICD-10-CM | POA: Diagnosis not present

## 2014-03-20 DIAGNOSIS — E1129 Type 2 diabetes mellitus with other diabetic kidney complication: Secondary | ICD-10-CM | POA: Diagnosis not present

## 2014-03-20 DIAGNOSIS — IMO0001 Reserved for inherently not codable concepts without codable children: Secondary | ICD-10-CM | POA: Diagnosis not present

## 2014-03-20 DIAGNOSIS — E785 Hyperlipidemia, unspecified: Secondary | ICD-10-CM | POA: Diagnosis not present

## 2014-03-20 DIAGNOSIS — I10 Essential (primary) hypertension: Secondary | ICD-10-CM | POA: Diagnosis not present

## 2014-03-20 DIAGNOSIS — Z6827 Body mass index (BMI) 27.0-27.9, adult: Secondary | ICD-10-CM | POA: Diagnosis not present

## 2014-04-24 DIAGNOSIS — G4700 Insomnia, unspecified: Secondary | ICD-10-CM | POA: Diagnosis not present

## 2014-04-24 DIAGNOSIS — Z23 Encounter for immunization: Secondary | ICD-10-CM | POA: Diagnosis not present

## 2014-04-24 DIAGNOSIS — M1990 Unspecified osteoarthritis, unspecified site: Secondary | ICD-10-CM | POA: Diagnosis not present

## 2014-04-24 DIAGNOSIS — Z6827 Body mass index (BMI) 27.0-27.9, adult: Secondary | ICD-10-CM | POA: Diagnosis not present

## 2014-06-05 DIAGNOSIS — E119 Type 2 diabetes mellitus without complications: Secondary | ICD-10-CM | POA: Diagnosis not present

## 2014-06-05 DIAGNOSIS — IMO0002 Reserved for concepts with insufficient information to code with codable children: Secondary | ICD-10-CM | POA: Diagnosis not present

## 2014-06-05 DIAGNOSIS — I10 Essential (primary) hypertension: Secondary | ICD-10-CM | POA: Diagnosis not present

## 2014-06-05 DIAGNOSIS — M810 Age-related osteoporosis without current pathological fracture: Secondary | ICD-10-CM | POA: Diagnosis not present

## 2014-06-05 DIAGNOSIS — M5430 Sciatica, unspecified side: Secondary | ICD-10-CM | POA: Diagnosis not present

## 2014-06-05 DIAGNOSIS — Z79899 Other long term (current) drug therapy: Secondary | ICD-10-CM | POA: Diagnosis not present

## 2014-06-14 ENCOUNTER — Ambulatory Visit (INDEPENDENT_AMBULATORY_CARE_PROVIDER_SITE_OTHER): Payer: Medicare Other

## 2014-06-14 ENCOUNTER — Ambulatory Visit (INDEPENDENT_AMBULATORY_CARE_PROVIDER_SITE_OTHER): Payer: Medicare Other | Admitting: Orthopedic Surgery

## 2014-06-14 DIAGNOSIS — M170 Bilateral primary osteoarthritis of knee: Secondary | ICD-10-CM

## 2014-06-14 DIAGNOSIS — M25562 Pain in left knee: Secondary | ICD-10-CM

## 2014-06-14 DIAGNOSIS — M25569 Pain in unspecified knee: Secondary | ICD-10-CM

## 2014-06-14 DIAGNOSIS — M1710 Unilateral primary osteoarthritis, unspecified knee: Secondary | ICD-10-CM | POA: Diagnosis not present

## 2014-06-14 NOTE — Progress Notes (Signed)
Patient ID: [name] [name], female   DOB: 26-Mar-1946, 68 y.o.   MRN: [id number]  Bilateral knee pain  This patient has had lumbar spine fusion by Dr. Joya Salm. Comes in complains of bilateral medial knee pain. Her back and hips a bit hurting her so much she's decided to rest and her knee pain subsided somewhat.  System review posterior leg pain buttock pain back pain Leg weakness  No swelling in either knee. No catching locking or mechanical symptoms of the knee   General the patient is well-developed and well-nourished grooming and hygiene are normal Oriented x3 Mood and affect normal Ambulation she is using a walker she has a grossly sign he walks Inspection of the both knees mild varus or range of motion both knees are stable motor exam quadriceps normal skin bilaterally normal pulse and perfusion normal bilaterally  X-ray left knee arthritis   Knee  Injection Procedure Note  Pre-operative Diagnosis: left knee oa  Post-operative Diagnosis: same  Indications: pain  Anesthesia: ethyl chloride   Procedure Details   Verbal consent was obtained for the procedure. Time out was completed.The joint was prepped with alcohol, followed by  Ethyl chloride spray and A 20 gauge needle was inserted into the knee via lateral approach; 84ml 1% lidocaine and 1 ml of depomedrol  was then injected into the joint . The needle was removed and the area cleansed and dressed.  Complications:  None; patient tolerated the procedure well. Knee  Injection Procedure Note  Pre-operative Diagnosis: right knee oa  Post-operative Diagnosis: same  Indications: pain  Anesthesia: ethyl chloride   Procedure Details   Verbal consent was obtained for the procedure. Time out was completed.The joint was prepped with alcohol, followed by  Ethyl chloride spray and A 20 gauge needle was inserted into the knee via lateral approach; 34ml 1% lidocaine and 1 ml of depomedrol  was then injected into the joint . The  needle was removed and the area cleansed and dressed.  Complications:  None; patient tolerated the procedure well.

## 2014-06-20 ENCOUNTER — Other Ambulatory Visit (HOSPITAL_COMMUNITY): Payer: Self-pay | Admitting: Family Medicine

## 2014-06-20 DIAGNOSIS — M545 Low back pain, unspecified: Secondary | ICD-10-CM | POA: Diagnosis not present

## 2014-06-20 DIAGNOSIS — Z6826 Body mass index (BMI) 26.0-26.9, adult: Secondary | ICD-10-CM | POA: Diagnosis not present

## 2014-06-25 ENCOUNTER — Ambulatory Visit (HOSPITAL_COMMUNITY)
Admission: RE | Admit: 2014-06-25 | Discharge: 2014-06-25 | Disposition: A | Discharge status: Home / Self Care | Payer: Medicare Other | Source: Ambulatory Visit | Attending: Family Medicine | Admitting: Family Medicine

## 2014-06-25 ENCOUNTER — Ambulatory Visit (HOSPITAL_COMMUNITY): Payer: Medicare Other

## 2014-06-25 DIAGNOSIS — M5126 Other intervertebral disc displacement, lumbar region: Secondary | ICD-10-CM | POA: Insufficient documentation

## 2014-06-25 DIAGNOSIS — M545 Low back pain: Secondary | ICD-10-CM

## 2014-07-03 ENCOUNTER — Telehealth: Payer: Self-pay | Admitting: Orthopedic Surgery

## 2014-07-03 ENCOUNTER — Other Ambulatory Visit: Payer: Self-pay | Admitting: Orthopedic Surgery

## 2014-07-03 MED ORDER — NORTRIPTYLINE HCL 25 MG PO CAPS: 25.0000 mg | ORAL_CAPSULE | Freq: Three times a day (TID) | ORAL | Status: DC

## 2014-07-03 NOTE — Telephone Encounter (Signed)
Sent in pamelor 25 mg tid

## 2014-07-03 NOTE — Telephone Encounter (Signed)
Routing to Dr Harrison 

## 2014-07-04 NOTE — Telephone Encounter (Signed)
PATIENT AWARE

## 2014-07-06 ENCOUNTER — Other Ambulatory Visit: Payer: Self-pay | Admitting: Neurosurgery

## 2014-07-06 DIAGNOSIS — M545 Low back pain, unspecified: Secondary | ICD-10-CM | POA: Diagnosis not present

## 2014-07-06 DIAGNOSIS — Z6826 Body mass index (BMI) 26.0-26.9, adult: Secondary | ICD-10-CM | POA: Diagnosis not present

## 2014-07-06 DIAGNOSIS — G8929 Other chronic pain: Secondary | ICD-10-CM

## 2014-07-06 DIAGNOSIS — I10 Essential (primary) hypertension: Secondary | ICD-10-CM | POA: Diagnosis not present

## 2014-07-06 DIAGNOSIS — M5137 Other intervertebral disc degeneration, lumbosacral region: Secondary | ICD-10-CM | POA: Diagnosis not present

## 2014-07-09 DIAGNOSIS — Z6826 Body mass index (BMI) 26.0-26.9, adult: Secondary | ICD-10-CM | POA: Diagnosis not present

## 2014-07-09 DIAGNOSIS — K5732 Diverticulitis of large intestine without perforation or abscess without bleeding: Secondary | ICD-10-CM | POA: Diagnosis not present

## 2014-07-09 DIAGNOSIS — E039 Hypothyroidism, unspecified: Secondary | ICD-10-CM | POA: Diagnosis not present

## 2014-07-09 DIAGNOSIS — R197 Diarrhea, unspecified: Secondary | ICD-10-CM | POA: Diagnosis not present

## 2014-07-10 ENCOUNTER — Other Ambulatory Visit: Payer: Medicare Other

## 2014-07-12 ENCOUNTER — Ambulatory Visit
Admission: RE | Admit: 2014-07-12 | Discharge: 2014-07-12 | Disposition: A | Discharge status: Home / Self Care | Payer: Medicare Other | Source: Ambulatory Visit | Attending: Neurosurgery | Admitting: Neurosurgery

## 2014-07-12 VITALS — BP 126/63 | HR 96

## 2014-07-12 DIAGNOSIS — M549 Dorsalgia, unspecified: Secondary | ICD-10-CM | POA: Diagnosis not present

## 2014-07-12 DIAGNOSIS — M545 Low back pain: Principal | ICD-10-CM

## 2014-07-12 DIAGNOSIS — G8929 Other chronic pain: Secondary | ICD-10-CM

## 2014-07-12 DIAGNOSIS — M5126 Other intervertebral disc displacement, lumbar region: Secondary | ICD-10-CM | POA: Diagnosis not present

## 2014-07-12 DIAGNOSIS — M48061 Spinal stenosis, lumbar region without neurogenic claudication: Secondary | ICD-10-CM | POA: Diagnosis not present

## 2014-07-12 MED ORDER — ONDANSETRON HCL 4 MG/2ML IJ SOLN
4.0000 mg | Freq: Once | INTRAMUSCULAR | Status: AC
  Administered 2014-07-12: 4 mg via INTRAMUSCULAR

## 2014-07-12 MED ORDER — DIAZEPAM 5 MG PO TABS
5.0000 mg | ORAL_TABLET | Freq: Once | ORAL | Status: AC
  Administered 2014-07-12: 5 mg via ORAL

## 2014-07-12 MED ORDER — MEPERIDINE HCL 100 MG/ML IJ SOLN
75.0000 mg | Freq: Once | INTRAMUSCULAR | Status: AC
  Administered 2014-07-12: 75 mg via INTRAMUSCULAR

## 2014-07-12 MED ORDER — IOHEXOL 180 MG/ML  SOLN
20.0000 mL | Freq: Once | INTRAMUSCULAR | Status: AC | PRN
  Administered 2014-07-12: 20 mL via INTRATHECAL

## 2014-07-12 NOTE — Discharge Instructions (Signed)
Myelogram Discharge Instructions  1. Go home and rest quietly for the next 24 hours.  It is important to lie flat for the next 24 hours.  Get up only to go to the restroom.  You may lie in the bed or on a couch on your back, your stomach, your left side or your right side.  You may have one pillow under your head.  You may have pillows between your knees while you are on your side or under your knees while you are on your back.  2. DO NOT drive today.  Recline the seat as far back as it will go, while still wearing your seat belt, on the way home.  3. You may get up to go to the bathroom as needed.  You may sit up for 10 minutes to eat.  You may resume your normal diet and medications unless otherwise indicated.  Drink plenty of extra fluids today and tomorrow.  4. The incidence of a spinal headache with nausea and/or vomiting is about 5% (one in 20 patients).  If you develop a headache, lie flat and drink plenty of fluids until the headache goes away.  Caffeinated beverages may be helpful.  If you develop severe nausea and vomiting or a headache that does not go away with flat bed rest, call [phone].  5. You may resume normal activities after your 24 hours of bed rest is over; however, do not exert yourself strongly or do any heavy lifting tomorrow.  6. Call your physician for a follow-up appointment.   You may resume Nortriptyline on Friday, July 13, 2014 after 1:00p.m.

## 2014-07-12 NOTE — Progress Notes (Signed)
Patient denies taking nortriptyline/pamelor when asked pre-myelogram today, though it is listed in epic as one of her medications.  jkl

## 2014-07-16 ENCOUNTER — Encounter: Payer: Self-pay | Admitting: Internal Medicine

## 2014-07-17 DIAGNOSIS — M4310 Spondylolisthesis, site unspecified: Secondary | ICD-10-CM | POA: Diagnosis not present

## 2014-07-17 DIAGNOSIS — Z6827 Body mass index (BMI) 27.0-27.9, adult: Secondary | ICD-10-CM | POA: Diagnosis not present

## 2014-07-17 DIAGNOSIS — I10 Essential (primary) hypertension: Secondary | ICD-10-CM | POA: Diagnosis not present

## 2014-07-18 ENCOUNTER — Other Ambulatory Visit: Payer: Self-pay | Admitting: Neurosurgery

## 2014-07-25 ENCOUNTER — Encounter (HOSPITAL_COMMUNITY)
Admission: RE | Admit: 2014-07-25 | Discharge: 2014-07-25 | Disposition: A | Discharge status: Home / Self Care | Payer: Medicare Other | Source: Ambulatory Visit | Attending: Neurosurgery | Admitting: Neurosurgery

## 2014-07-25 ENCOUNTER — Encounter (HOSPITAL_COMMUNITY): Payer: Self-pay

## 2014-07-25 DIAGNOSIS — E119 Type 2 diabetes mellitus without complications: Secondary | ICD-10-CM | POA: Diagnosis not present

## 2014-07-25 DIAGNOSIS — I10 Essential (primary) hypertension: Secondary | ICD-10-CM | POA: Diagnosis not present

## 2014-07-25 DIAGNOSIS — E039 Hypothyroidism, unspecified: Secondary | ICD-10-CM | POA: Insufficient documentation

## 2014-07-25 DIAGNOSIS — Z01810 Encounter for preprocedural cardiovascular examination: Secondary | ICD-10-CM | POA: Diagnosis not present

## 2014-07-25 DIAGNOSIS — R9431 Abnormal electrocardiogram [ECG] [EKG]: Secondary | ICD-10-CM | POA: Diagnosis not present

## 2014-07-25 DIAGNOSIS — I4510 Unspecified right bundle-branch block: Secondary | ICD-10-CM | POA: Insufficient documentation

## 2014-07-25 DIAGNOSIS — I359 Nonrheumatic aortic valve disorder, unspecified: Secondary | ICD-10-CM | POA: Insufficient documentation

## 2014-07-25 HISTORY — DX: Peptic ulcer, site unspecified, unspecified as acute or chronic, without hemorrhage or perforation: K27.9

## 2014-07-25 LAB — BASIC METABOLIC PANEL
ANION GAP: 19 — AB (ref 5–15)
BUN: 15 mg/dL (ref 6–23)
CO2: 23 mEq/L (ref 19–32)
CREATININE: 0.72 mg/dL (ref 0.50–1.10)
Calcium: 9.6 mg/dL (ref 8.4–10.5)
Chloride: 94 mEq/L — ABNORMAL LOW (ref 96–112)
GFR calc Af Amer: >90 mL/min (ref >90)
GFR calc non Af Amer: 86 mL/min — ABNORMAL LOW (ref >90)
Glucose, Bld: 320 mg/dL — ABNORMAL HIGH (ref 70–99)
Potassium: 3.3 mEq/L — ABNORMAL LOW (ref 3.7–5.3)
Sodium: 136 mEq/L — ABNORMAL LOW (ref 137–147)

## 2014-07-25 LAB — CBC
HCT: 41.9 % (ref 36.0–46.0)
Hemoglobin: 14 g/dL (ref 12.0–15.0)
MCH: 29.8 pg (ref 26.0–34.0)
MCHC: 33.4 g/dL (ref 30.0–36.0)
MCV: 89.1 fL (ref 78.0–100.0)
Platelets: 388 10*3/uL (ref 150–400)
RBC: 4.7 MIL/uL (ref 3.87–5.11)
RDW: 14.2 % (ref 11.5–15.5)
WBC: 8.2 10*3/uL (ref 4.0–10.5)

## 2014-07-25 LAB — TYPE AND SCREEN
ABO/RH(D): O POS
Antibody Screen: NEGATIVE

## 2014-07-25 LAB — SURGICAL PCR SCREEN
MRSA, PCR: NEGATIVE
STAPHYLOCOCCUS AUREUS: POSITIVE — AB

## 2014-07-25 NOTE — Pre-Procedure Instructions (Signed)
MALEEKA CHIRICO  07/25/2014   Your procedure is scheduled on:  August 03, 2018  Friday   Report to [facility] Admitting at 9:30  AM.   Call this number if you have problems the morning of surgery: [phone]   Remember:   Do not eat food or drink liquids after midnight.    Take these medicines the morning of surgery with A SIP OF WATER: amlodipine(Norvas),levothyroxine(Synthroid)NORTRIPTYLINE(pamelor)                STOP ASPIRIN,SUPPLEMENTS,VITAMINS,LOVAZA,IBUPROFEN,ADVIL   Do not wear jewelry, make-up or nail polish.  Do not wear lotions, powders, or perfumes. You may not wear deodorant.  Do not shave 48 hours prior to surgery. Men may shave face and neck.  Do not bring valuables to the hospital.  [facility] is not responsible for any belongings or valuables.               Contacts, dentures or bridgework may not be worn into surgery.   Leave suitcase in the car. After surgery it may be brought to your room.  For patients admitted to the hospital, discharge time is determined by your   treatment team.              .   Special Instructions: SEE ATTACHED SHEET FOR INSTRUCTIONS  ON CHG SHOWER/BATH    Please read over the following fact sheets that you were given: Pain Booklet, Coughing and Deep Breathing, Blood Transfusion Information and Surgical Site Infection Prevention

## 2014-07-25 NOTE — Progress Notes (Signed)
Pt denies SOB, chest pain, and being under the care of a cardiologist. Pt stated that she saw a cardiologist two years ago but cannot remember the physicians name,  practice and what diagnostic studies were done. Pt chart forwarded to [place], [place] ( anesthesia) to review abnormal labs ( glucose 320) and EKG.

## 2014-07-26 NOTE — Progress Notes (Addendum)
Anesthesia Chart Review:  Pt is 68 year old female posted for L3-4 posterior lumbar interbody fusion with augmentation of fusion and new screw at L3 by Dr. Joya Salm for 08/03/14.   PMH includes HTN, DM-uncontrolled, aortic stenosis, hypothyroidism, rheumatoid and osteoarthritis, and melanoma.   Aortic stenosis last evaluated by echo in 2010; note from Dr. Sallyanne Kuster from [facility] in 2012 indicates pt needs recheck of AS in 3-5 years from 2010 evaluation. Contacted [facility] (formerly [facility]) and pt has not been seen since 2012. Myra Gianotti, PA, spoke with pt. Pt reports no further cardiac evals. Denies cp, sob, edema, syncope/pre-syncope. Activity limited for last 2 months due to back pain and ambulates with cane for months.   Echo 11/29/2009 shows mild aortic stenosis, normal LV systolic function, EF [id number], impaired LV relaxation, mild MR, trace AR and aortic sclerosis.   Preoperative labs reviewed. Random glucose is 320. Myra Gianotti, PA, spoke with pt. Pt reports did not take DM medications morning of PAT testing and had eaten fruit cocktail just prior to testing.  States fasting glucose normally <200, most recent fasting glucose 2 days ago was 97.    Discussed Dr. Orene Desanctis who recommends pre-operative evaluation by cardiology with echo prior to proceeding.   Notified Jessica with Dr. Harley Hallmark office of need for cardiac eval and of hyperglycemia.   Requesting records from pt's PCP, Dr. Redmond School in [place] regarding DM status.   Willeen Cass, FNP [facility] Short Stay Center/Anesthesiology Phone [phone] 07/26/2014 2:49 PM  Addendum: 07/26/2014 12:27 PM Received PCP notes. Random glucose and A1C from 03/20/14 were 254, 8.5%, respectively.  She has an appointment with cardiologist Dr. Quay Burow on 08/02/14 to determine status of cardiac clearance.  [facility] Short Stay Center/Anesthesiology Phone [phone] 07/31/2014 12:27 PM  Addendum: 08/02/2014 5:00 PM Received  a phone call from [place] earlier today. She had been in contact with Dr. Gerarda Fraction regarding patient's preoperative labs results.  Reportedly, he recommended to recheck glucose on the day of surgery then if felt acceptable could proceed from his standpoint but recommended a post-operative medicine consult.  Dr. Kennon Holter office note from this afternoon reviewed.  She is getting an echo this afternoon to reassess LVF and AS. Results are still pending, but will hopefully be available for review prior to her afternoon surgery tomorrow.  There is a note from Brita Romp, RN the cardiac clearance was given at "moderate risk."

## 2014-07-31 ENCOUNTER — Telehealth: Payer: Self-pay | Admitting: Cardiovascular Disease

## 2014-08-02 ENCOUNTER — Ambulatory Visit: Payer: Medicare Other | Admitting: Cardiovascular Disease

## 2014-08-02 ENCOUNTER — Ambulatory Visit (INDEPENDENT_AMBULATORY_CARE_PROVIDER_SITE_OTHER): Payer: Medicare Other | Admitting: Cardiovascular Disease

## 2014-08-02 ENCOUNTER — Encounter: Payer: Self-pay | Admitting: Cardiovascular Disease

## 2014-08-02 ENCOUNTER — Encounter: Payer: Self-pay | Admitting: *Deleted

## 2014-08-02 ENCOUNTER — Telehealth: Payer: Self-pay | Admitting: Cardiovascular Disease

## 2014-08-02 ENCOUNTER — Ambulatory Visit (HOSPITAL_BASED_OUTPATIENT_CLINIC_OR_DEPARTMENT_OTHER)
Admission: RE | Admit: 2014-08-02 | Discharge: 2014-08-02 | Disposition: A | Discharge status: Home / Self Care | Payer: Medicare Other | Source: Ambulatory Visit | Attending: Cardiovascular Disease | Admitting: Cardiovascular Disease

## 2014-08-02 VITALS — BP 86/58 | HR 80 | Ht <= 58 in | Wt 123.0 lb

## 2014-08-02 DIAGNOSIS — I359 Nonrheumatic aortic valve disorder, unspecified: Secondary | ICD-10-CM

## 2014-08-02 DIAGNOSIS — Z85820 Personal history of malignant melanoma of skin: Secondary | ICD-10-CM | POA: Diagnosis not present

## 2014-08-02 DIAGNOSIS — E119 Type 2 diabetes mellitus without complications: Secondary | ICD-10-CM | POA: Insufficient documentation

## 2014-08-02 DIAGNOSIS — M4310 Spondylolisthesis, site unspecified: Secondary | ICD-10-CM | POA: Diagnosis not present

## 2014-08-02 DIAGNOSIS — I350 Nonrheumatic aortic (valve) stenosis: Secondary | ICD-10-CM | POA: Insufficient documentation

## 2014-08-02 DIAGNOSIS — M069 Rheumatoid arthritis, unspecified: Secondary | ICD-10-CM | POA: Diagnosis present

## 2014-08-02 DIAGNOSIS — Z01810 Encounter for preprocedural cardiovascular examination: Secondary | ICD-10-CM

## 2014-08-02 DIAGNOSIS — E785 Hyperlipidemia, unspecified: Secondary | ICD-10-CM | POA: Diagnosis present

## 2014-08-02 DIAGNOSIS — M519 Unspecified thoracic, thoracolumbar and lumbosacral intervertebral disc disorder: Secondary | ICD-10-CM | POA: Diagnosis not present

## 2014-08-02 DIAGNOSIS — Q762 Congenital spondylolisthesis: Secondary | ICD-10-CM | POA: Diagnosis not present

## 2014-08-02 DIAGNOSIS — IMO0002 Reserved for concepts with insufficient information to code with codable children: Secondary | ICD-10-CM | POA: Diagnosis not present

## 2014-08-02 DIAGNOSIS — M5126 Other intervertebral disc displacement, lumbar region: Secondary | ICD-10-CM | POA: Diagnosis not present

## 2014-08-02 DIAGNOSIS — M545 Low back pain, unspecified: Secondary | ICD-10-CM | POA: Diagnosis not present

## 2014-08-02 DIAGNOSIS — E039 Hypothyroidism, unspecified: Secondary | ICD-10-CM | POA: Diagnosis not present

## 2014-08-02 DIAGNOSIS — I10 Essential (primary) hypertension: Secondary | ICD-10-CM | POA: Insufficient documentation

## 2014-08-02 DIAGNOSIS — Z981 Arthrodesis status: Secondary | ICD-10-CM | POA: Diagnosis not present

## 2014-08-02 MED ORDER — CEFAZOLIN SODIUM-DEXTROSE 2-3 GM-% IV SOLR
2.0000 g | INTRAVENOUS | Status: DC
  Filled 2014-08-02: qty 50

## 2014-08-02 NOTE — Assessment & Plan Note (Signed)
Controlled on current medications 

## 2014-08-02 NOTE — Telephone Encounter (Signed)
Informed Lorriane Shire that clearance is not available yet .  Patient needs a echo done ,  Echo is being done as we speak. Will inform Curt Bears RN to call with answer.  Curt Bears has back line phone

## 2014-08-02 NOTE — Telephone Encounter (Signed)
[name] [name] was calling in today to see if Dr. Gwenlyn Found cleared her for surgery. Please call  Thanks

## 2014-08-02 NOTE — Patient Instructions (Signed)
You will have an echocardiogram today (for clearance for surgery )   Your physician wants you to follow-up in: 1 year with Dr. Gwenlyn Found. You will receive a reminder letter in the mail two months in advance. If you don't receive a letter, please call our office to schedule the follow-up appointment.

## 2014-08-02 NOTE — Progress Notes (Addendum)
08/02/2014 [name] [name]   11-16-1946  [id number]  Primary Physician Glo Herring., MD Primary Cardiologist: Lorretta Harp MD Renae Gloss   HPI:  [name] [name] is a 68 year old mildly overweight widowed Caucasian female mother of 2 children, grandmother for her grandchildren whose primary care physician is Dr. Kerin Perna. She was referred for preoperative clearance before back surgery scheduled for tomorrow by Dr. Joya Salm. She has a history of hypertension, hyperlipidemia and diabetes. There is no family history. She has mild aortic stenosis by 2-D echo performed in 2010. She denies chest pain or shortness of breath.   Current Outpatient Prescriptions  Medication Sig Dispense Refill  . amLODipine (NORVASC) 10 MG tablet Take 10 mg by mouth daily.        . Cholecalciferol ([employer] VIT D 5000 HIGH-POTENCY) 5000 UNITS capsule Take 5,000 Units by mouth daily.      . cimetidine (TAGAMET) 200 MG tablet Take 200 mg by mouth daily as needed (Reflux).       . enalapril (VASOTEC) 20 MG tablet Take 20 mg by mouth daily.      Marland Kitchen gabapentin (NEURONTIN) 300 MG capsule Take 600 mg by mouth 3 (three) times daily.       Marland Kitchen glipiZIDE (GLUCOTROL) 10 MG tablet Take 10 mg by mouth 2 (two) times daily before a meal.      . hydrochlorothiazide 25 MG tablet Take 12.5 mg by mouth daily.       Marland Kitchen levothyroxine (SYNTHROID, LEVOTHROID) 100 MCG tablet Take 100 mcg by mouth daily.      . metFORMIN (GLUCOPHAGE) 1000 MG tablet Take 1,000 mg by mouth 2 (two) times daily with a meal.      . Nutritional Supplements (EQ ESTROBLEND MENOPAUSE) TABS Take 1 tablet by mouth daily.      . potassium chloride SA (K-DUR,KLOR-CON) 20 MEQ tablet Take 20 mEq by mouth daily.       . Prenatal Vit-Fe Fumarate-FA (PRENATAL MULTIVITAMIN) TABS Take 1 tablet by mouth daily.      . simvastatin (ZOCOR) 20 MG tablet Take 20 mg by mouth at bedtime.        . vitamin C (ASCORBIC ACID) 500 MG tablet Take 500 mg by mouth daily.        . vitamin E 400 UNIT capsule Take 400 Units by mouth daily.       No current facility-administered medications for this visit.   Facility-Administered Medications Ordered in Other Visits  Medication Dose Route Frequency Provider Last Rate Last Dose  . [START ON 08/03/2014] ceFAZolin (ANCEF) IVPB 2 g/50 mL premix  2 g Intravenous On Call to Hyrum, MD        No Known Allergies  History   Social History  . Marital Status: Widowed    Spouse Name: N/A    Number of Children: N/A  . Years of Education: N/A   Occupational History  . Not on file.   Social History Main Topics  . Smoking status: Never Smoker   . Smokeless tobacco: Never Used  . Alcohol Use: No     Comment: "Wine a couple times a month"  . Drug Use: No  . Sexual Activity: Yes    Birth Control/ Protection: Surgical   Other Topics Concern  . Not on file   Social History Narrative   Husband deceased Apr 16, 2011.      Review of Systems: General: negative for chills, fever, night sweats or weight changes.  Cardiovascular:  negative for chest pain, dyspnea on exertion, edema, orthopnea, palpitations, paroxysmal nocturnal dyspnea or shortness of breath Dermatological: negative for rash Respiratory: negative for cough or wheezing Urologic: negative for hematuria Abdominal: negative for nausea, vomiting, diarrhea, bright red blood per rectum, melena, or hematemesis Neurologic: negative for visual changes, syncope, or dizziness All other systems reviewed and are otherwise negative except as noted above.    Blood pressure 86/58, pulse 80, height 4\' 9"  (1.448 m), weight 123 lb (55.792 kg).  General appearance: alert and no distress Neck: no adenopathy, no carotid bruit, no JVD, supple, symmetrical, trachea midline and thyroid not enlarged, symmetric, no tenderness/mass/nodules Lungs: clear to auscultation bilaterally Heart: soft outflow tract murmur Extremities: 2+ pedal pulses bilaterally  EKG not performed  to  ASSESSMENT AND PLAN:   Essential hypertension Controlled on current medications  Hyperlipidemia On statin therapy followed by her PCP  Aortic stenosis Mild aortic stenosis by 2-D echo in 2010. She is a soft outflow tract murmur. She is scheduled for back surgery tomorrow. We will recheck a 2-D echo for LV function and progression of her aortic stenosis      Lorretta Harp MD [facility] 08/02/2014 3:24 PM   Addendum: A 2-D echo was performed the office today that showed preserved LV function, moderate concentric LVH and moderate aortic stenosis with a valve area of approximately 1 cm squared. Based on this, I am clearing her for her upcoming surgery at moderately increased risk because of her aortic stenosis.

## 2014-08-02 NOTE — H&P (Signed)
[name] [name] is an 68 y.o. female.   Chief Complaint:lower back pain HPI: for several months the patient has been complaining of lumbar pain with radiation to both lower extremities with no relief with conservative treatment. In the past she had fusion from l4 to s1. Outpatient myelogram was done  Past Medical History  Diagnosis Date  . Diabetes mellitus   . Hypertension   . Thyroid disease   . Arthritis   . Hypothyroidism   . Diverticulitis   . Glaucoma   . Hyperlipidemia   . Rheumatoid arthritis(714.0)   . Osteoarthritis   . Heart murmur   . Aortic stenosis   . Bronchitis   . Blood transfusion without reported diagnosis   . Melanoma     left thigh, 2010  . Peptic ulcer     Past Surgical History  Procedure Laterality Date  . Foot surgery  1994    left   . Abdominal hysterectomy  1988  . Cholecystectomy  1974  . Back surgery  07/2008    fusion  . Appendectomy    . Colonoscopy  06/06/2007    Proximal diminutive rectal polyps cold biopsied/removed, otherwise normal rectum/ Left-sided diverticula, the remainder of the colonic mucosa appeared normal. Path-tubular adnemoa.   . Back surgery  [id number]    complicated by post-op anemia requiring transfusion  . Melanoma excision  2010  . Eye surgery    . Macular degen    . Breast surgery      right breast biopsy    Family History  Problem Relation Age of Onset  . Anesthesia problems Neg Hx   . Colon cancer Brother     diagnosed at age 67  . Diabetes Brother   . Hypertension Mother   . Hypertension Other   . Diabetes Other   . Heart failure Father     passed away of MI in his 80s  . Heart failure Mother    Social History:  reports that she has never smoked. She has never used smokeless tobacco. She reports that she does not drink alcohol or use illicit drugs.  Allergies: No Known Allergies  No prescriptions prior to admission    No results found for this or any previous visit (from the past 48 hour(s)). No  results found.  Review of Systems  Constitutional: Negative.   HENT: Negative.   Eyes: Negative.   Respiratory: Negative.   Cardiovascular:       Arterial hypertension  Gastrointestinal: Positive for heartburn and diarrhea.  Genitourinary: Negative.   Musculoskeletal: Positive for back pain.  Skin: Negative.   Neurological: Positive for sensory change and focal weakness.  Endo/Heme/Allergies:       DM  Psychiatric/Behavioral: Negative.     There were no vitals taken for this visit. Physical Exam hent, patient came to see me using a wheelchair to relief the pain. Hent, nl. Neck, some pain with mobility. Cv, nl, lugs, clear. Abdomen, soft. Extremities, nl. NEURO weakness proximally in both legs. Sensory, nl, dtr 1 pkus. Myelo shows solid fusion at l4-5, 5-1. At the l3-4 level she has a grade 1 spondylolisthesis with a large hnp  Assessment/Plan Patient had a cardiac clearance. She is to vave decompression , discectomy at l34 with fusion  Using cages and pedicles srews. She and her family are aware of risks and benefits  Marijean Montanye M 08/02/2014, 5:21 PM

## 2014-08-02 NOTE — Telephone Encounter (Signed)
[name] [name] is aware that clearance was given at moderate risk. Letter faxed

## 2014-08-02 NOTE — Progress Notes (Signed)
2D Echocardiogram Complete.  08/02/2014   Fatou Dunnigan [place], RDCS

## 2014-08-02 NOTE — Assessment & Plan Note (Signed)
On statin therapy followed by her PCP 

## 2014-08-02 NOTE — Assessment & Plan Note (Signed)
Mild aortic stenosis by 2-D echo in 2010. She is a soft outflow tract murmur. She is scheduled for back surgery tomorrow. We will recheck a 2-D echo for LV function and progression of her aortic stenosis

## 2014-08-03 ENCOUNTER — Encounter (HOSPITAL_COMMUNITY): Payer: Medicare Other | Admitting: Emergency Medicine

## 2014-08-03 ENCOUNTER — Inpatient Hospital Stay (HOSPITAL_COMMUNITY): Payer: Medicare Other

## 2014-08-03 ENCOUNTER — Encounter (HOSPITAL_COMMUNITY)
Admission: RE | Disposition: A | Discharge status: Home / Self Care | Payer: Medicare Other | Source: Ambulatory Visit | Attending: Neurosurgery

## 2014-08-03 ENCOUNTER — Inpatient Hospital Stay (HOSPITAL_COMMUNITY): Payer: Medicare Other | Admitting: Anesthesiology

## 2014-08-03 ENCOUNTER — Inpatient Hospital Stay (HOSPITAL_COMMUNITY)
Admission: RE | Admit: 2014-08-03 | Discharge: 2014-08-08 | DRG: 460 | Disposition: A | Discharge status: Home / Self Care | Payer: Medicare Other | Source: Ambulatory Visit | Attending: Neurosurgery | Admitting: Neurosurgery

## 2014-08-03 ENCOUNTER — Encounter (HOSPITAL_COMMUNITY): Payer: Self-pay | Admitting: *Deleted

## 2014-08-03 DIAGNOSIS — M069 Rheumatoid arthritis, unspecified: Secondary | ICD-10-CM | POA: Diagnosis present

## 2014-08-03 DIAGNOSIS — M4316 Spondylolisthesis, lumbar region: Secondary | ICD-10-CM | POA: Diagnosis present

## 2014-08-03 DIAGNOSIS — IMO0002 Reserved for concepts with insufficient information to code with codable children: Secondary | ICD-10-CM | POA: Diagnosis not present

## 2014-08-03 DIAGNOSIS — M5126 Other intervertebral disc displacement, lumbar region: Secondary | ICD-10-CM | POA: Diagnosis present

## 2014-08-03 DIAGNOSIS — E785 Hyperlipidemia, unspecified: Secondary | ICD-10-CM | POA: Diagnosis present

## 2014-08-03 DIAGNOSIS — M545 Low back pain, unspecified: Secondary | ICD-10-CM | POA: Diagnosis present

## 2014-08-03 DIAGNOSIS — Z981 Arthrodesis status: Secondary | ICD-10-CM

## 2014-08-03 DIAGNOSIS — M519 Unspecified thoracic, thoracolumbar and lumbosacral intervertebral disc disorder: Secondary | ICD-10-CM | POA: Diagnosis not present

## 2014-08-03 DIAGNOSIS — Z01810 Encounter for preprocedural cardiovascular examination: Secondary | ICD-10-CM | POA: Diagnosis not present

## 2014-08-03 DIAGNOSIS — E119 Type 2 diabetes mellitus without complications: Secondary | ICD-10-CM | POA: Diagnosis not present

## 2014-08-03 DIAGNOSIS — Q762 Congenital spondylolisthesis: Secondary | ICD-10-CM | POA: Diagnosis not present

## 2014-08-03 DIAGNOSIS — Z85820 Personal history of malignant melanoma of skin: Secondary | ICD-10-CM

## 2014-08-03 DIAGNOSIS — M4310 Spondylolisthesis, site unspecified: Secondary | ICD-10-CM | POA: Diagnosis not present

## 2014-08-03 DIAGNOSIS — I10 Essential (primary) hypertension: Secondary | ICD-10-CM | POA: Diagnosis present

## 2014-08-03 DIAGNOSIS — E039 Hypothyroidism, unspecified: Secondary | ICD-10-CM | POA: Diagnosis not present

## 2014-08-03 LAB — GLUCOSE, CAPILLARY
GLUCOSE-CAPILLARY: 199 mg/dL — AB (ref 70–99)
Glucose-Capillary: 219 mg/dL — ABNORMAL HIGH (ref 70–99)
Glucose-Capillary: 178 mg/dL — ABNORMAL HIGH (ref 70–99)
Glucose-Capillary: 149 mg/dL — ABNORMAL HIGH (ref 70–99)

## 2014-08-03 SURGERY — POSTERIOR LUMBAR FUSION 1 LEVEL
Anesthesia: General

## 2014-08-03 MED ORDER — GABAPENTIN 300 MG PO CAPS
600.0000 mg | ORAL_CAPSULE | Freq: Three times a day (TID) | ORAL | Status: DC
  Administered 2014-08-03 – 2014-08-08 (×14): 600 mg via ORAL
  Filled 2014-08-03 (×4): qty 2
  Filled 2014-08-03: qty 6
  Filled 2014-08-03 (×4): qty 2
  Filled 2014-08-03: qty 6
  Filled 2014-08-03 (×4): qty 2

## 2014-08-03 MED ORDER — AMLODIPINE BESYLATE 10 MG PO TABS
10.0000 mg | ORAL_TABLET | Freq: Every day | ORAL | Status: DC
  Administered 2014-08-04 – 2014-08-08 (×4): 10 mg via ORAL
  Filled 2014-08-03 (×5): qty 1

## 2014-08-03 MED ORDER — HYDROCHLOROTHIAZIDE 25 MG PO TABS
12.5000 mg | ORAL_TABLET | Freq: Every day | ORAL | Status: DC
  Administered 2014-08-04 – 2014-08-08 (×4): 12.5 mg via ORAL
  Filled 2014-08-03 (×4): qty 1

## 2014-08-03 MED ORDER — POTASSIUM CHLORIDE CRYS ER 20 MEQ PO TBCR
20.0000 meq | EXTENDED_RELEASE_TABLET | Freq: Every day | ORAL | Status: DC
  Administered 2014-08-04 – 2014-08-08 (×5): 20 meq via ORAL
  Filled 2014-08-03 (×5): qty 1

## 2014-08-03 MED ORDER — OXYCODONE HCL 5 MG/5ML PO SOLN: 5.0000 mg | Freq: Once | ORAL | Status: DC | PRN

## 2014-08-03 MED ORDER — BUPIVACAINE LIPOSOME 1.3 % IJ SUSP
INTRAMUSCULAR | Status: DC | PRN
  Administered 2014-08-03: 20 mL

## 2014-08-03 MED ORDER — PHENYLEPHRINE HCL 10 MG/ML IJ SOLN
INTRAMUSCULAR | Status: DC | PRN
  Administered 2014-08-03 (×3): 80 ug via INTRAVENOUS
  Administered 2014-08-03: 40 ug via INTRAVENOUS

## 2014-08-03 MED ORDER — 0.9 % SODIUM CHLORIDE (POUR BTL) OPTIME
TOPICAL | Status: DC | PRN
  Administered 2014-08-03: 1000 mL

## 2014-08-03 MED ORDER — ZOLPIDEM TARTRATE 5 MG PO TABS: 5.0000 mg | ORAL_TABLET | Freq: Every evening | ORAL | Status: DC | PRN

## 2014-08-03 MED ORDER — MENTHOL 3 MG MT LOZG: 1.0000 | LOZENGE | OROMUCOSAL | Status: DC | PRN

## 2014-08-03 MED ORDER — THROMBIN 20000 UNITS EX SOLR
CUTANEOUS | Status: DC | PRN
  Administered 2014-08-03: 14:00:00 via TOPICAL

## 2014-08-03 MED ORDER — LACTATED RINGERS IV SOLN
INTRAVENOUS | Status: DC | PRN
  Administered 2014-08-03 (×2): via INTRAVENOUS

## 2014-08-03 MED ORDER — SODIUM CHLORIDE 0.9 % IJ SOLN: 3.0000 mL | INTRAMUSCULAR | Status: DC | PRN

## 2014-08-03 MED ORDER — SODIUM CHLORIDE 0.9 % IV SOLN
10.0000 mg | INTRAVENOUS | Status: DC | PRN
  Administered 2014-08-03: 20 ug/min via INTRAVENOUS

## 2014-08-03 MED ORDER — GLYCOPYRROLATE 0.2 MG/ML IJ SOLN
INTRAMUSCULAR | Status: AC
  Filled 2014-08-03: qty 3

## 2014-08-03 MED ORDER — OXYCODONE HCL 5 MG PO TABS: 5.0000 mg | ORAL_TABLET | Freq: Once | ORAL | Status: DC | PRN

## 2014-08-03 MED ORDER — INSULIN ASPART 100 UNIT/ML ~~LOC~~ SOLN: 0.0000 [IU] | Freq: Every day | SUBCUTANEOUS | Status: DC

## 2014-08-03 MED ORDER — SODIUM CHLORIDE 0.9 % IJ SOLN: 9.0000 mL | INTRAMUSCULAR | Status: DC | PRN

## 2014-08-03 MED ORDER — LACTATED RINGERS IV SOLN
Freq: Once | INTRAVENOUS | Status: AC
  Administered 2014-08-03: 11:00:00 via INTRAVENOUS

## 2014-08-03 MED ORDER — METFORMIN HCL 500 MG PO TABS
1000.0000 mg | ORAL_TABLET | Freq: Two times a day (BID) | ORAL | Status: DC
  Administered 2014-08-03 – 2014-08-07 (×7): 1000 mg via ORAL
  Filled 2014-08-03 (×8): qty 2

## 2014-08-03 MED ORDER — PHENOL 1.4 % MT LIQD: 1.0000 | OROMUCOSAL | Status: DC | PRN

## 2014-08-03 MED ORDER — SODIUM CHLORIDE 0.9 % IJ SOLN
3.0000 mL | Freq: Two times a day (BID) | INTRAMUSCULAR | Status: DC
  Administered 2014-08-04 – 2014-08-08 (×7): 3 mL via INTRAVENOUS

## 2014-08-03 MED ORDER — ONDANSETRON HCL 4 MG/2ML IJ SOLN: 4.0000 mg | Freq: Four times a day (QID) | INTRAMUSCULAR | Status: DC | PRN

## 2014-08-03 MED ORDER — VITAMIN D 1000 UNITS PO TABS
5000.0000 [IU] | ORAL_TABLET | Freq: Every day | ORAL | Status: DC
  Administered 2014-08-04 – 2014-08-08 (×4): 5000 [IU] via ORAL
  Filled 2014-08-03 (×5): qty 5

## 2014-08-03 MED ORDER — LIDOCAINE HCL (CARDIAC) 20 MG/ML IV SOLN
INTRAVENOUS | Status: DC | PRN
  Administered 2014-08-03: 60 mg via INTRAVENOUS

## 2014-08-03 MED ORDER — OXYCODONE-ACETAMINOPHEN 5-325 MG PO TABS
2.0000 | ORAL_TABLET | ORAL | Status: DC | PRN
  Administered 2014-08-06 – 2014-08-08 (×6): 2 via ORAL
  Filled 2014-08-03 (×7): qty 2

## 2014-08-03 MED ORDER — PROPOFOL 10 MG/ML IV BOLUS
INTRAVENOUS | Status: DC | PRN
  Administered 2014-08-03: 100 mg via INTRAVENOUS

## 2014-08-03 MED ORDER — ARTIFICIAL TEARS OP OINT
TOPICAL_OINTMENT | OPHTHALMIC | Status: DC | PRN
  Administered 2014-08-03: 1 via OPHTHALMIC

## 2014-08-03 MED ORDER — ROCURONIUM BROMIDE 100 MG/10ML IV SOLN
INTRAVENOUS | Status: DC | PRN
  Administered 2014-08-03: 50 mg via INTRAVENOUS

## 2014-08-03 MED ORDER — FENTANYL CITRATE 0.05 MG/ML IJ SOLN
INTRAMUSCULAR | Status: DC | PRN
  Administered 2014-08-03 (×2): 50 ug via INTRAVENOUS
  Administered 2014-08-03: 100 ug via INTRAVENOUS
  Administered 2014-08-03: 50 ug via INTRAVENOUS

## 2014-08-03 MED ORDER — CEFAZOLIN SODIUM-DEXTROSE 2-3 GM-% IV SOLR
INTRAVENOUS | Status: AC
  Filled 2014-08-03: qty 50

## 2014-08-03 MED ORDER — GLYCOPYRROLATE 0.2 MG/ML IJ SOLN
INTRAMUSCULAR | Status: DC | PRN
  Administered 2014-08-03: 0.6 mg via INTRAVENOUS

## 2014-08-03 MED ORDER — FENTANYL 10 MCG/ML IV SOLN
INTRAVENOUS | Status: DC
  Administered 2014-08-03: 18:00:00 via INTRAVENOUS
  Administered 2014-08-04: 75 ug via INTRAVENOUS
  Administered 2014-08-04: 30 ug via INTRAVENOUS
  Administered 2014-08-05: 15 ug via INTRAVENOUS
  Administered 2014-08-05: 30 ug via INTRAVENOUS
  Filled 2014-08-03: qty 50

## 2014-08-03 MED ORDER — ACETAMINOPHEN 650 MG RE SUPP: 650.0000 mg | RECTAL | Status: DC | PRN

## 2014-08-03 MED ORDER — NALOXONE HCL 0.4 MG/ML IJ SOLN: 0.4000 mg | INTRAMUSCULAR | Status: DC | PRN

## 2014-08-03 MED ORDER — ACETAMINOPHEN 325 MG PO TABS
650.0000 mg | ORAL_TABLET | ORAL | Status: DC | PRN
  Administered 2014-08-06: 650 mg via ORAL
  Filled 2014-08-03: qty 2

## 2014-08-03 MED ORDER — VANCOMYCIN HCL 1000 MG IV SOLR
INTRAVENOUS | Status: AC
  Filled 2014-08-03: qty 1000

## 2014-08-03 MED ORDER — SODIUM CHLORIDE 0.9 % IV SOLN: 250.0000 mL | INTRAVENOUS | Status: DC

## 2014-08-03 MED ORDER — DIPHENHYDRAMINE HCL 50 MG/ML IJ SOLN: 12.5000 mg | Freq: Four times a day (QID) | INTRAMUSCULAR | Status: DC | PRN

## 2014-08-03 MED ORDER — HYDROMORPHONE HCL PF 1 MG/ML IJ SOLN: 0.2500 mg | INTRAMUSCULAR | Status: DC | PRN

## 2014-08-03 MED ORDER — INSULIN ASPART 100 UNIT/ML ~~LOC~~ SOLN
0.0000 [IU] | Freq: Three times a day (TID) | SUBCUTANEOUS | Status: DC
  Administered 2014-08-04 (×3): 4 [IU] via SUBCUTANEOUS
  Administered 2014-08-05 (×2): 3 [IU] via SUBCUTANEOUS
  Administered 2014-08-05: 4 [IU] via SUBCUTANEOUS
  Administered 2014-08-06: 3 [IU] via SUBCUTANEOUS

## 2014-08-03 MED ORDER — LEVOTHYROXINE SODIUM 100 MCG PO TABS
100.0000 ug | ORAL_TABLET | Freq: Every day | ORAL | Status: DC
  Administered 2014-08-04 – 2014-08-08 (×5): 100 ug via ORAL
  Filled 2014-08-03 (×5): qty 1

## 2014-08-03 MED ORDER — SIMVASTATIN 20 MG PO TABS
20.0000 mg | ORAL_TABLET | Freq: Every day | ORAL | Status: DC
  Administered 2014-08-03 – 2014-08-07 (×5): 20 mg via ORAL
  Filled 2014-08-03 (×5): qty 1

## 2014-08-03 MED ORDER — ENALAPRIL MALEATE 20 MG PO TABS
20.0000 mg | ORAL_TABLET | Freq: Every day | ORAL | Status: DC
  Administered 2014-08-04 – 2014-08-08 (×4): 20 mg via ORAL
  Filled 2014-08-03 (×6): qty 1

## 2014-08-03 MED ORDER — GLIPIZIDE 10 MG PO TABS
10.0000 mg | ORAL_TABLET | Freq: Two times a day (BID) | ORAL | Status: DC
  Administered 2014-08-03 – 2014-08-07 (×7): 10 mg via ORAL
  Filled 2014-08-03: qty 1
  Filled 2014-08-03 (×2): qty 2
  Filled 2014-08-03: qty 1
  Filled 2014-08-03: qty 2
  Filled 2014-08-03: qty 1
  Filled 2014-08-03: qty 2
  Filled 2014-08-03: qty 1
  Filled 2014-08-03: qty 2
  Filled 2014-08-03: qty 1
  Filled 2014-08-03 (×3): qty 2
  Filled 2014-08-03 (×3): qty 1
  Filled 2014-08-03: qty 2
  Filled 2014-08-03 (×2): qty 1

## 2014-08-03 MED ORDER — DIPHENHYDRAMINE HCL 12.5 MG/5ML PO ELIX: 12.5000 mg | ORAL_SOLUTION | Freq: Four times a day (QID) | ORAL | Status: DC | PRN

## 2014-08-03 MED ORDER — ONDANSETRON HCL 4 MG/2ML IJ SOLN
4.0000 mg | INTRAMUSCULAR | Status: DC | PRN
  Administered 2014-08-07: 4 mg via INTRAVENOUS
  Filled 2014-08-03: qty 2

## 2014-08-03 MED ORDER — PHENYLEPHRINE HCL 10 MG/ML IJ SOLN
INTRAMUSCULAR | Status: AC
  Filled 2014-08-03: qty 1

## 2014-08-03 MED ORDER — ONDANSETRON HCL 4 MG/2ML IJ SOLN
INTRAMUSCULAR | Status: DC | PRN
Start: 2014-08-03 — End: 2014-08-03
  Administered 2014-08-03: 4 mg via INTRAVENOUS

## 2014-08-03 MED ORDER — PHENYLEPHRINE 40 MCG/ML (10ML) SYRINGE FOR IV PUSH (FOR BLOOD PRESSURE SUPPORT)
PREFILLED_SYRINGE | INTRAVENOUS | Status: AC
  Filled 2014-08-03: qty 10

## 2014-08-03 MED ORDER — CHOLECALCIFEROL 125 MCG (5000 UT) PO CAPS: 5000.0000 [IU] | ORAL_CAPSULE | Freq: Every day | ORAL | Status: DC

## 2014-08-03 MED ORDER — NEOSTIGMINE METHYLSULFATE 10 MG/10ML IV SOLN
INTRAVENOUS | Status: DC | PRN
  Administered 2014-08-03: 4 mg via INTRAVENOUS

## 2014-08-03 MED ORDER — ARTIFICIAL TEARS OP OINT
TOPICAL_OINTMENT | OPHTHALMIC | Status: AC
  Filled 2014-08-03: qty 3.5

## 2014-08-03 MED ORDER — MIDAZOLAM HCL 2 MG/2ML IJ SOLN
INTRAMUSCULAR | Status: AC
  Filled 2014-08-03: qty 2

## 2014-08-03 MED ORDER — NEOSTIGMINE METHYLSULFATE 10 MG/10ML IV SOLN
INTRAVENOUS | Status: AC
  Filled 2014-08-03: qty 1

## 2014-08-03 MED ORDER — BUPIVACAINE LIPOSOME 1.3 % IJ SUSP
20.0000 mL | Freq: Once | INTRAMUSCULAR | Status: AC
  Filled 2014-08-03: qty 20

## 2014-08-03 MED ORDER — MIDAZOLAM HCL 5 MG/5ML IJ SOLN
INTRAMUSCULAR | Status: DC | PRN
  Administered 2014-08-03: 2 mg via INTRAVENOUS

## 2014-08-03 MED ORDER — CEFAZOLIN SODIUM 1-5 GM-% IV SOLN
1.0000 g | Freq: Three times a day (TID) | INTRAVENOUS | Status: AC
  Administered 2014-08-03 – 2014-08-04 (×2): 1 g via INTRAVENOUS
  Filled 2014-08-03 (×2): qty 50

## 2014-08-03 MED ORDER — DIAZEPAM 5 MG PO TABS
5.0000 mg | ORAL_TABLET | Freq: Four times a day (QID) | ORAL | Status: DC | PRN
  Administered 2014-08-08: 5 mg via ORAL
  Filled 2014-08-03: qty 1

## 2014-08-03 MED ORDER — SODIUM CHLORIDE 0.9 % IV SOLN
INTRAVENOUS | Status: DC
  Administered 2014-08-03 – 2014-08-04 (×2): via INTRAVENOUS

## 2014-08-03 MED ORDER — FENTANYL CITRATE 0.05 MG/ML IJ SOLN
INTRAMUSCULAR | Status: AC
  Filled 2014-08-03: qty 5

## 2014-08-03 SURGICAL SUPPLY — 71 items
ADAPTER LUER SYR (ADAPTER) ×2 IMPLANT
BENZOIN TINCTURE PRP APPL 2/3 (GAUZE/BANDAGES/DRESSINGS) ×2 IMPLANT
BLADE SURG ROTATE 9660 (MISCELLANEOUS) IMPLANT
BUR ACORN 6.0 (BURR) ×2 IMPLANT
BUR FLUTED EXTD (BURR) ×2 IMPLANT
BUR MATCHSTICK NEURO 3.0 LAGG (BURR) ×2 IMPLANT
CANISTER SUCT 3000ML (MISCELLANEOUS) ×2 IMPLANT
CAP REVERE LOCKING (Cap) ×8 IMPLANT
CONT SPEC 4OZ CLIKSEAL STRL BL (MISCELLANEOUS) ×4 IMPLANT
COVER BACK TABLE 24X17X13 BIG (DRAPES) IMPLANT
COVER TABLE BACK 60X90 (DRAPES) ×2 IMPLANT
DRAPE C-ARM 42X72 X-RAY (DRAPES) ×4 IMPLANT
DRAPE LAPAROTOMY 100X72X124 (DRAPES) ×2 IMPLANT
DRAPE POUCH INSTRU U-SHP 10X18 (DRAPES) ×2 IMPLANT
DRSG OPSITE POSTOP 4X8 (GAUZE/BANDAGES/DRESSINGS) ×2 IMPLANT
DURAPREP 26ML APPLICATOR (WOUND CARE) ×2 IMPLANT
ELECT BLADE 4.0 EZ CLEAN MEGAD (MISCELLANEOUS) ×2
ELECT REM PT RETURN 9FT ADLT (ELECTROSURGICAL) ×2
ELECTRODE BLDE 4.0 EZ CLN MEGD (MISCELLANEOUS) ×1 IMPLANT
ELECTRODE REM PT RTRN 9FT ADLT (ELECTROSURGICAL) ×1 IMPLANT
EVACUATOR 1/8 PVC DRAIN (DRAIN) IMPLANT
GAUZE SPONGE 4X4 12PLY STRL (GAUZE/BANDAGES/DRESSINGS) ×2 IMPLANT
GAUZE SPONGE 4X4 16PLY XRAY LF (GAUZE/BANDAGES/DRESSINGS) ×2 IMPLANT
GLOVE BIO SURGEON STRL SZ7 (GLOVE) ×2 IMPLANT
GLOVE BIOGEL M 8.0 STRL (GLOVE) ×2 IMPLANT
GLOVE BIOGEL PI IND STRL 7.0 (GLOVE) ×1 IMPLANT
GLOVE BIOGEL PI INDICATOR 7.0 (GLOVE) ×1
GLOVE ECLIPSE 7.5 STRL STRAW (GLOVE) ×6 IMPLANT
GLOVE EXAM NITRILE LRG STRL (GLOVE) IMPLANT
GLOVE EXAM NITRILE MD LF STRL (GLOVE) IMPLANT
GLOVE EXAM NITRILE XL STR (GLOVE) IMPLANT
GLOVE EXAM NITRILE XS STR PU (GLOVE) IMPLANT
GOWN STRL REUS W/ TWL LRG LVL3 (GOWN DISPOSABLE) ×2 IMPLANT
GOWN STRL REUS W/ TWL XL LVL3 (GOWN DISPOSABLE) ×1 IMPLANT
GOWN STRL REUS W/TWL 2XL LVL3 (GOWN DISPOSABLE) IMPLANT
GOWN STRL REUS W/TWL LRG LVL3 (GOWN DISPOSABLE) ×2
GOWN STRL REUS W/TWL XL LVL3 (GOWN DISPOSABLE) ×1
IMPLANT RISE 11X17-8MM SPINE (Neuro Prosthesis/Implant) ×2 IMPLANT
IMPLANT RISE 8X22 9-15MM (Neuro Prosthesis/Implant) ×2 IMPLANT
KIT BASIN OR (CUSTOM PROCEDURE TRAY) ×2 IMPLANT
KIT INFUSE MEDIUM (Orthopedic Implant) ×2 IMPLANT
KIT ROOM TURNOVER OR (KITS) ×2 IMPLANT
NEEDLE HYPO 18GX1.5 BLUNT FILL (NEEDLE) IMPLANT
NEEDLE HYPO 21X1 ECLIPSE (NEEDLE) ×2 IMPLANT
NEEDLE HYPO 21X1.5 SAFETY (NEEDLE) IMPLANT
NEEDLE HYPO 25X1 1.5 SAFETY (NEEDLE) IMPLANT
NS IRRIG 1000ML POUR BTL (IV SOLUTION) ×2 IMPLANT
PACK FOAM VITOSS 10CC (Orthopedic Implant) ×2 IMPLANT
PACK LAMINECTOMY NEURO (CUSTOM PROCEDURE TRAY) ×2 IMPLANT
PAD ABD 8X10 STRL (GAUZE/BANDAGES/DRESSINGS) IMPLANT
PAD ARMBOARD 7.5X6 YLW CONV (MISCELLANEOUS) ×6 IMPLANT
PATTIES SURGICAL .5 X1 (DISPOSABLE) ×2 IMPLANT
PATTIES SURGICAL .5 X3 (DISPOSABLE) IMPLANT
ROD REVERE 6.35 45MM (Rod) ×4 IMPLANT
SCREW REVERE 5.5X45 (Screw) ×4 IMPLANT
SPONGE LAP 4X18 X RAY DECT (DISPOSABLE) IMPLANT
SPONGE NEURO XRAY DETECT 1X3 (DISPOSABLE) IMPLANT
SPONGE SURGIFOAM ABS GEL 100 (HEMOSTASIS) ×2 IMPLANT
STRIP CLOSURE SKIN 1/2X4 (GAUZE/BANDAGES/DRESSINGS) ×2 IMPLANT
SUT VIC AB 1 CT1 18XBRD ANBCTR (SUTURE) ×2 IMPLANT
SUT VIC AB 1 CT1 8-18 (SUTURE) ×2
SUT VIC AB 2-0 CP2 18 (SUTURE) ×2 IMPLANT
SUT VIC AB 3-0 SH 8-18 (SUTURE) ×2 IMPLANT
SYR 20CC LL (SYRINGE) IMPLANT
SYR 20ML ECCENTRIC (SYRINGE) ×2 IMPLANT
SYR 5ML LL (SYRINGE) IMPLANT
SYR BULB IRRIGATION 50ML (SYRINGE) ×2 IMPLANT
TOWEL OR 17X24 6PK STRL BLUE (TOWEL DISPOSABLE) ×2 IMPLANT
TOWEL OR 17X26 10 PK STRL BLUE (TOWEL DISPOSABLE) ×2 IMPLANT
TRAY FOLEY CATH 14FRSI W/METER (CATHETERS) ×2 IMPLANT
WATER STERILE IRR 1000ML POUR (IV SOLUTION) ×2 IMPLANT

## 2014-08-03 NOTE — Telephone Encounter (Signed)
Closed encounter °

## 2014-08-03 NOTE — Anesthesia Preprocedure Evaluation (Signed)
Anesthesia Evaluation  Patient identified by MRN, date of birth, ID band Patient awake    Reviewed: Allergy & Precautions, H&P , NPO status , Patient's Chart, lab work & pertinent test results  Airway Mallampati: II  Neck ROM: full    Dental   Pulmonary          Cardiovascular hypertension, + Valvular Problems/Murmurs AS  Mild AS by TTE   Neuro/Psych    GI/Hepatic PUD,   Endo/Other  diabetes, Type 2Hypothyroidism   Renal/GU      Musculoskeletal  (+) Arthritis -, Rheumatoid disorders,    Abdominal   Peds  Hematology   Anesthesia Other Findings   Reproductive/Obstetrics                           Anesthesia Physical Anesthesia Plan  ASA: III  Anesthesia Plan: General   Post-op Pain Management:    Induction: Intravenous  Airway Management Planned: Oral ETT  Additional Equipment:   Intra-op Plan:   Post-operative Plan: Extubation in OR  Informed Consent: I have reviewed the patients History and Physical, chart, labs and discussed the procedure including the risks, benefits and alternatives for the proposed anesthesia with the patient or authorized representative who has indicated his/her understanding and acceptance.     Plan Discussed with: CRNA, Anesthesiologist and Surgeon  Anesthesia Plan Comments:         Anesthesia Quick Evaluation

## 2014-08-03 NOTE — Anesthesia Postprocedure Evaluation (Signed)
  Anesthesia Post-op Note  Patient: [name] [name]  Procedure(s) Performed: Procedure(s): Lumbar three/four. Posterior lumbar interbody fusion with augmentation of fusion and new screw at Lumbar three (N/A)  Patient Location: PACU  Anesthesia Type: General   Level of Consciousness: awake, alert  and oriented  Airway and Oxygen Therapy: Patient Spontanous Breathing  Post-op Pain: mild  Post-op Assessment: Post-op Vital signs reviewed  Post-op Vital Signs: Reviewed  Last Vitals:  Filed Vitals:   08/03/14 1750  BP:   Pulse:   Temp:   Resp: 15    Complications: No apparent anesthesia complications

## 2014-08-03 NOTE — Anesthesia Procedure Notes (Signed)
Procedure Name: Intubation Date/Time: 08/03/2014 1:04 PM Performed by: Ollen Bowl Pre-anesthesia Checklist: Patient identified, Emergency Drugs available, Suction available, Patient being monitored and Timeout performed Patient Re-evaluated:Patient Re-evaluated prior to inductionOxygen Delivery Method: Circle system utilized and Simple face mask Preoxygenation: Pre-oxygenation with 100% oxygen Intubation Type: IV induction Ventilation: Mask ventilation without difficulty Grade View: Grade I Tube type: Oral Tube size: 7.0 mm Number of attempts: 1 Airway Equipment and Method: Patient positioned with wedge pillow,  Stylet and Video-laryngoscopy Placement Confirmation: ETT inserted through vocal cords under direct vision,  positive ETCO2 and breath sounds checked- equal and bilateral Secured at: 20 cm Tube secured with: Tape Dental Injury: Teeth and Oropharynx as per pre-operative assessment

## 2014-08-03 NOTE — Transfer of Care (Signed)
Immediate Anesthesia Transfer of Care Note  Patient: [name] [name]  Procedure(s) Performed: Procedure(s): Lumbar three/four. Posterior lumbar interbody fusion with augmentation of fusion and new screw at Lumbar three (N/A)  Patient Location: PACU  Anesthesia Type:General  Level of Consciousness: awake and alert   Airway & Oxygen Therapy: Patient Spontanous Breathing and Patient connected to nasal cannula oxygen  Post-op Assessment: Report given to PACU RN and Post -op Vital signs reviewed and stable  Post vital signs: Reviewed and stable  Complications: No apparent anesthesia complications

## 2014-08-04 HISTORY — PX: LUMBAR SPINE SURGERY: SHX701

## 2014-08-04 LAB — GLUCOSE, CAPILLARY
Glucose-Capillary: 179 mg/dL — ABNORMAL HIGH (ref 70–99)
Glucose-Capillary: 154 mg/dL — ABNORMAL HIGH (ref 70–99)
Glucose-Capillary: 153 mg/dL — ABNORMAL HIGH (ref 70–99)
Glucose-Capillary: 115 mg/dL — ABNORMAL HIGH (ref 70–99)

## 2014-08-04 NOTE — Progress Notes (Signed)
Patient's hemovac dislodged when she attempted to get out of bed.

## 2014-08-04 NOTE — Progress Notes (Signed)
Patient voided 250cc after being bladder scanned.

## 2014-08-04 NOTE — Evaluation (Signed)
Physical Therapy Evaluation Patient Details Name: [name] [name] MRN: [id number] DOB: 01/17/1946 Today's Date: 08/04/2014   History of Present Illness  Pt is 68 y/o female admitted for s/p lumbar L3-4 Fusion  Clinical Impression  Patient is s/p lumbar fusion surgery resulting in functional limitations due to the deficits listed below (see PT Problem List). Pt limited due to bilateral LE weakness with knee buckling in standing.  Pt does have w/c at home if needed and plans to d/c home at this time with 24 hour assistance.  Patient will benefit from skilled PT to increase their independence and safety with mobility to allow discharge to the venue listed below.     Follow Up Recommendations Home health PT;Supervision/Assistance - 24 hour (Pt wants to return home)    Equipment Recommendations  3in1 (PT) (possible need depending on OT evaluation)    Recommendations for Other Services       Precautions / Restrictions Precautions Precautions: Back;Fall Precaution Booklet Issued: Yes (comment) Precaution Comments: Pt educated on 3/3 back precautions.  Required Braces or Orthoses: Spinal Brace Spinal Brace: Lumbar corset;Applied in sitting position Restrictions Weight Bearing Restrictions: No      Mobility  Bed Mobility Overal bed mobility: Needs Assistance Bed Mobility: Rolling;Sidelying to Sit Rolling: Mod assist Sidelying to sit: Mod assist       General bed mobility comments: Assistance to initiate movement with max VCs for proper technique with hand and body position to prevent twisting  Transfers Overall transfer level: Needs assistance Equipment used: 1 person hand held assist Transfers: Stand Pivot Transfers   Stand pivot transfers: Mod assist       General transfer comment: Assistance to maintain balance with manual cues to prevent bilateral knee buckling  Ambulation/Gait Ambulation/Gait assistance:  (Pt unable to ambulate at this time)               Stairs            Wheelchair Mobility    Modified Rankin (Stroke Patients Only)       Balance Overall balance assessment: Needs assistance Sitting-balance support: Feet supported;Bilateral upper extremity supported Sitting balance-Leahy Scale: Fair Sitting balance - Comments: Pt continues to lean to right side while sitting EOB      Standing balance-Leahy Scale: Poor Standing balance comment: Pt with bilateral knee buckling                             Pertinent Vitals/Pain Pain Assessment: 0-10 Pain Score: 6  Pain Location: lower back Pain Descriptors / Indicators: Burning Pain Intervention(s): Premedicated before session    Home Living Family/patient expects to be discharged to:: Private residence Living Arrangements: Other relatives;Non-relatives/Friends Available Help at Discharge: Family;Available 24 hours/day Type of Home: House Home Access: Stairs to enter Entrance Stairs-Rails: None Entrance Stairs-Number of Steps: 2 small steps Home Layout: One level Home Equipment: Walker - 2 wheels;Wheelchair - manual      Prior Function Level of Independence: Independent with assistive device(s)         Comments: Pt reports using RW prior to surgery however also reports several falls due to bilateral knee buckling     Hand Dominance   Dominant Hand: Right    Extremity/Trunk Assessment               Lower Extremity Assessment: Generalized weakness         Communication   Communication: No difficulties  Cognition Arousal/Alertness: Awake/alert Behavior  During Therapy: WFL for tasks assessed/performed Overall Cognitive Status: Within Functional Limits for tasks assessed                      General Comments      Exercises        Assessment/Plan    PT Assessment Patient needs continued PT services  PT Diagnosis Difficulty walking;Generalized weakness;Acute pain   PT Problem List Decreased strength;Decreased  activity tolerance;Decreased balance;Decreased mobility;Decreased knowledge of use of DME;Decreased safety awareness;Pain  PT Treatment Interventions DME instruction;Gait training;Stair training;Functional mobility training;Therapeutic activities;Therapeutic exercise;Balance training;Patient/family education   PT Goals (Current goals can be found in the Care Plan section) Acute Rehab PT Goals Patient Stated Goal: To return home and be able to ambulate PT Goal Formulation: With patient Potential to Achieve Goals: Good    Frequency Min 5X/week   Barriers to discharge        Co-evaluation               End of Session Equipment Utilized During Treatment: Gait belt;Back brace Activity Tolerance: Patient tolerated treatment well Patient left: in chair;with call bell/phone within reach Nurse Communication: Mobility status         Time: [id number] PT Time Calculation (min): 26 min   Charges:   PT Evaluation $Initial PT Evaluation Tier I: 1 Procedure PT Treatments $Therapeutic Activity: 8-22 mins   PT G Codes:          Padraig Nhan 2014/08/31, 9:51 AM  Antoine Poche, PT DPT [phone]

## 2014-08-04 NOTE — Progress Notes (Signed)
Filed Vitals:   08/04/14 0124 08/04/14 0400 08/04/14 0540 08/04/14 0751  BP: 129/54  125/57   Pulse: 99  103   Temp: 99.1 F (37.3 C)  100.3 F (37.9 C)   TempSrc: Oral  Oral   Resp: 16 17 18 20   Weight:      SpO2: 100% 100% 100% 99%    Patient resting in bed, she reports that nursing staff tried to mobilize this morning, but that her legs were quite weak and gave way. Awaiting PT and OT. Dressing clean and dry, moderate drainage into Hemovac drain.  Plan: Continued to progress through postoperative recovery. Awake PT and OT.  Hosie Spangle, MD 08/04/2014, 8:43 AM

## 2014-08-04 NOTE — Op Note (Signed)
NAME:  [name] [name], MORO NO.:  [id number]  MEDICAL RECORD NO.:  [id number]  LOCATION:  [id number]                        FACILITY:  [facility]  PHYSICIAN:  Leeroy Cha, M.D.   DATE OF BIRTH:  [birth year]  DATE OF PROCEDURE:  08/03/2014 DATE OF DISCHARGE:                              OPERATIVE REPORT   PREOPERATIVE DIAGNOSES:  L3-L4 herniated disk with acute and chronic radiculopathy.  Spondylolisthesis grade 1, lumbar stenosis.  Status post fusion L4-5 and L5-1.  POSTOPERATIVE DIAGNOSES:  L3-L4 herniated disk with acute and chronic radiculopathy.  Spondylolisthesis grade 1, lumbar stenosis.  Status post fusion L4-5 and L5-1.  PROCEDURE:  Bilateral L3 laminectomy, lysis of adhesions, decompression of the L3-L4 nerve root.  Bilateral diskectomy L3-L4 more than normal to be able to introduce 2 cages.  Insertion of new pedicle screws at the L2- 3, fusion between L3 and L4.  Posterolateral arthrodesis with autograft, BMP, and Vitoss.  Cell Saver.  C-arm.  SURGEON:  Leeroy Cha, M.D.  ASSISTANT:  Kary Kos, M.D.  CLINICAL HISTORY:  Ms. Seeger is a lady who in the past underwent fusion at L4-5 and L5-1.  She did well, had no problem whatsoever. Lately for the past 6 weeks, she had been complaining of back pain with radiation to both legs.  No better with conservative treatment. Outpatient myelogram showed that she has a large herniated disk at the level of L3-L4 with severe stenosis and spondylolisthesis.  Surgery was advised.  She and her family knew the risks and benefits of the surgery.  PROCEDURE IN DETAIL:  The patient was taken to the OR, and after intubation, she was positioned in a prone manner.  The back was cleaned with DuraPrep and drapes were applied.  Midline incision from L2-L3 down to mid part of the previous incision was made.  The incision was carried down through the subcutaneous tissue and adipose tissue.  Dissection was carried laterally, we were  able to see the pedicles of L4.  We continued our dissection until we were able to see the takeoff of the transverse process of L3.  Then, we identified the spinous process of L3, which was removed as well as the lamina.  Using the drill, we were able to remove the facet of L3 bilaterally.  The patient had quite a bit of adhesion and using the 1, 2, and 3 mm Kerrison punch, we decompressed the thecal sac, the L3 and L4 nerve roots.  Retraction of the thecal sac was done with the right side.  We got into the disk space.  Total gross diskectomy was achieved medial and laterally more than normal.  The same procedure was done on the left side.  We were able to introduce 2 cages; one which expanded to 15, 9 mm and the other one 8 mm which expanded also to 15.  The rest of the disk space was filled up with a mix of autograft, Vitoss, and BMP.  Then using the C-arm, we feel the pedicle of L2-3.  We made a hole all the way down to the vertebral body of L3. We feel the hole just to be sure that we were surrounded by bone in all  4 quadrants.  Two screws of 5.5 x 45 were introduced.  We tried to use a hook to put a new __rod________ to the previous one, but there was no space.  Because of that, we cut the rod right below L4.  We knew the fusion was solid __________.  Then, a new rod from L3 to L4 was inserted in the right side and later on the left side.  They were kept in place with Capps. Then with the drill, we drilled the lateral aspect of the L3-4 facet as well as the take-off of L3 and L4 transverse process.  A mix of BMP, autograft, and Vitoss were used for arthrodesis.  The area was irrigated.  Valsalva maneuver twice was negative.  Small drain was left in the epidural space, and the wound was closed with several layers of Vicryl and staples.          ______________________________ Leeroy Cha, M.D.     EB/MEDQ  D:  08/03/2014  T:  08/03/2014  Job:  [id number]

## 2014-08-04 NOTE — Evaluation (Signed)
Occupational Therapy Evaluation Patient Details Name: [name] [name] MRN: [id number] DOB: 1946/06/16 Today's Date: 08/04/2014    History of Present Illness Pt is 68 y/o female admitted for s/p lumbar L3-4 Fusion   Clinical Impression   Pt s/p above. Pt independent with ADLs, PTA. Feel pt will benefit from acute OT to increase independence prior to d/c. Education provided to pt and her daughter.      Follow Up Recommendations  Home health OT;Supervision/Assistance - 24 hour    Equipment Recommendations  3 in 1 bedside comode    Recommendations for Other Services       Precautions / Restrictions Precautions Precautions: Back;Fall Precaution Booklet Issued: No Precaution Comments: Pt educated on 3/3 back precautions.  Required Braces or Orthoses: Spinal Brace Spinal Brace: Lumbar corset;Applied in sitting position Restrictions Weight Bearing Restrictions: No      Mobility Bed Mobility Overal bed mobility: Needs Assistance Bed Mobility: Rolling;Sit to Sidelying Rolling: Mod assist   Sit to sidelying: +2 for physical assistance;Max assist General bed mobility comments: Pt having difficult time scooting back on bed. OT and tech assisted with this. +2 assist to lie safely in sidelying position due to being close to EOB. Educated on log roll technique.  Transfers Overall transfer level: Needs assistance Equipment used: Rolling walker (2 wheeled) Transfers: Sit to/from [facility] Sit to Stand: Mod assist Stand pivot transfers: Mod assist       General transfer comment: cues for technique. Pt having buckling of knees.         ADL Overall ADL's : Needs assistance/impaired                 Upper Body Dressing : Moderate assistance;Sitting (support given sitting EOB)   Lower Body Dressing: Maximal assistance;Sit to/from stand   Toilet Transfer: Moderate assistance;RW (stand pivot from chair to bed)           Functional mobility during  ADLs: Moderate assistance (stand pivot) General ADL Comments: Educated on use of cup for teeth care as well as placement of grooming items. Educated on safety tips for home (rugs, safe shoewear). Educated on AE for LB ADLs and pt practiced with sockaid. Educated on back brace. Spoke with family about using belt to help pt with transfers for safety. Discussed alternative techniques to perform LB ADLs.      Vision                     Perception     Praxis      Pertinent Vitals/Pain Pain Assessment: 0-10 Pain Score: 8  Pain Location: back Pain Descriptors / Indicators: Burning (stinging) Pain Intervention(s): Premedicated before session     Hand Dominance Right   Extremity/Trunk Assessment Upper Extremity Assessment Upper Extremity Assessment: Generalized weakness   Lower Extremity Assessment Lower Extremity Assessment: Defer to PT evaluation       Communication Communication Communication: No difficulties   Cognition Arousal/Alertness: Lethargic;Suspect due to medications Behavior During Therapy: [facility] for tasks assessed/performed Overall Cognitive Status: Within Functional Limits for tasks assessed                     General Comments       Exercises       Shoulder Instructions      Home Living Family/patient expects to be discharged to:: Private residence Living Arrangements: Other relatives;Non-relatives/Friends Available Help at Discharge: Family;Available 24 hours/day Type of Home: House Home Access: Stairs to enter [employer]  of Steps: 2 small steps Entrance Stairs-Rails: None Home Layout: One level     Bathroom Shower/Tub: Walk-in shower;Tub/shower unit         Home Equipment: Walker - 2 wheels;Wheelchair - manual;Shower seat - built Hotel manager: Reacher;Long-handled sponge;Long-handled Conservation officer, historic buildings        Prior Functioning/Environment Level of Independence: Independent with assistive  device(s)        Comments: Pt reports using RW prior to surgery however also reports several falls due to bilateral knee buckling    OT Diagnosis: Acute pain;Generalized weakness   OT Problem List: Decreased strength;Decreased range of motion;Decreased activity tolerance;Impaired balance (sitting and/or standing);Decreased knowledge of use of DME or AE;Decreased knowledge of precautions;Pain;Decreased safety awareness   OT Treatment/Interventions: Self-care/ADL training;DME and/or AE instruction;Therapeutic activities;Patient/family education;Balance training    OT Goals(Current goals can be found in the care plan section) Acute Rehab OT Goals Patient Stated Goal: not stated OT Goal Formulation: With patient Time For Goal Achievement: 08/11/14 Potential to Achieve Goals: Good ADL Goals Pt Will Perform Lower Body Dressing: with min assist;with adaptive equipment;sitting/lateral leans;sit to/from stand Pt Will Transfer to Toilet: with min assist;ambulating;bedside commode Pt Will Perform Toileting - Clothing Manipulation and hygiene: with min assist;sitting/lateral leans;sit to/from stand Additional ADL Goal #1: Pt will independently verbalize/demonstrate 3/3 back precautions.   Additional ADL Goal #2: Pt/caregiver will be supervision level for bed mobility as precursor for ADLs.   OT Frequency: Min 2X/week   Barriers to D/C:            Co-evaluation              End of Session Equipment Utilized During Treatment: Gait belt;Rolling walker;Back brace  Activity Tolerance: Patient limited by fatigue;Patient limited by lethargy Patient left: in bed;with call bell/phone within reach;with family/visitor present   Time: [id number] OT Time Calculation (min): 24 min Charges:  OT General Charges $OT Visit: 1 Procedure OT Evaluation $Initial OT Evaluation Tier I: 1 Procedure OT Treatments $Self Care/Home Management : 8-22 mins G-CodesBenito Mccreedy  OTR/L [id number] 08/04/2014, 12:22 PM

## 2014-08-05 LAB — GLUCOSE, CAPILLARY
GLUCOSE-CAPILLARY: 136 mg/dL — AB (ref 70–99)
GLUCOSE-CAPILLARY: 179 mg/dL — AB (ref 70–99)
Glucose-Capillary: 135 mg/dL — ABNORMAL HIGH (ref 70–99)
Glucose-Capillary: 170 mg/dL — ABNORMAL HIGH (ref 70–99)

## 2014-08-05 MED ORDER — PNEUMOCOCCAL VAC POLYVALENT 25 MCG/0.5ML IJ INJ
0.5000 mL | INJECTION | INTRAMUSCULAR | Status: AC
  Administered 2014-08-06: 0.5 mL via INTRAMUSCULAR
  Filled 2014-08-05: qty 0.5

## 2014-08-05 NOTE — Progress Notes (Signed)
Patient ID: [name] [name], female   DOB: [birth year], 68 y.o.   MRN: [id number] Patient appears to be over medicated somnolent but arousable  Strength appears to be 5 out of 5 wound appears to be clean and dry  Continue PT OT we'll hold off on pain medications to try to symmetric her arouse more

## 2014-08-05 NOTE — Progress Notes (Signed)
Unable to waste fentanyl pca in pyxis. Wasted 283mcg/25ml fentanyl pca in sink. Witnessed by Roxan Diesel RN

## 2014-08-05 NOTE — Progress Notes (Signed)
Physical Therapy Treatment Patient Details Name: [name] [name] MRN: [id number] DOB: 12/18/1945 Today's Date: 08/05/2014    History of Present Illness Pt is 68 y/o female admitted for s/p lumbar L3-4 Fusion    PT Comments    Pt still lethargic today with therapy. While seated edge of bed pt had moment of complete alertness and stated "Do you want me to get there" in reference to the chair. At that time a stand pivot transfer was performed with mod assist of 1 and pt able to take 2-3 steps with no buckling. Pt quickly back to sleep after transfer and positioned in chair.   Follow Up Recommendations  Home health PT;Supervision/Assistance - 24 hour     Equipment Recommendations  3in1 (PT)    Recommendations for Other Services       Precautions / Restrictions Precautions Precautions: Back;Fall Precaution Comments: Pt educated on 3/3 back precautions.  Required Braces or Orthoses: Spinal Brace Spinal Brace: Lumbar corset;Applied in sitting position Restrictions Weight Bearing Restrictions: No    Mobility  Bed Mobility Overal bed mobility: Needs Assistance   Rolling: Mod assist Sidelying to sit: Mod assist       General bed mobility comments: cues on logroll technique and rail used to assist with mobility.  Transfers Overall transfer level: Needs assistance   Transfers: Stand Pivot Transfers Sit to Stand: +2 safety/equipment;Min assist Stand pivot transfers: +2 safety/equipment;Min assist       General transfer comment: cues for technique for bed to chair transer. no walker use and both knees blocked for buckling. Pt's daughter assisted with line management.   Ambulation/Gait                 Stairs            Wheelchair Mobility    Modified Rankin (Stroke Patients Only)       Balance Overall balance assessment: Needs assistance Sitting-balance support: Feet supported;Bilateral upper extremity supported Sitting balance-Leahy Scale:  Fair Sitting balance - Comments: when awake pt able to hold self upright, as pt nods off she leans all directions (random each time).  Edge of bed for 6-8 minutes working on alertness and postural control/support     Standing balance-Leahy Scale: Poor                      Cognition Arousal/Alertness: Lethargic;Suspect due to medications Behavior During Therapy: WFL for tasks assessed/performed Overall Cognitive Status: Within Functional Limits for tasks assessed                             Pertinent Vitals/Pain Pain Assessment: 0-10 Pain Score: 2  Pain Location: back Pain Descriptors / Indicators: Sore Pain Intervention(s): Limited activity within patient's tolerance;Monitored during session;Repositioned           PT Goals (current goals can now be found in the care plan section) Acute Rehab PT Goals Patient Stated Goal: not stated PT Goal Formulation: With patient Potential to Achieve Goals: Good Progress towards PT goals: Progressing toward goals    Frequency  Min 5X/week    PT Plan Current plan remains appropriate       End of Session Equipment Utilized During Treatment: Gait belt;Back brace Activity Tolerance: Patient limited by lethargy Patient left: in chair;with family/visitor present;with call bell/phone within reach     Time: 1345-1400 PT Time Calculation (min): 15 min  Charges:  $Therapeutic Activity: 8-22 mins  G CodesWillow Ora 08/05/2014, 2:21 PM  Willow Ora, PTA Office- [phone]

## 2014-08-05 NOTE — Progress Notes (Signed)
Physical Therapy Treatment Patient Details Name: [name] [name] MRN: [id number] DOB: [birth year] Today's Date: 08/05/2014    History of Present Illness Pt is 68 y/o female admitted for s/p lumbar L3-4 Fusion    PT Comments    Pt daughter came into hall while therapist was writing note from previous session to state pt needed to use bathroom, therefore pt seen second time. Pt needed more assistance with transfers this time. RN notified of pt using bsc.    Follow Up Recommendations  Home health PT;Supervision/Assistance - 24 hour     Equipment Recommendations  3in1 (PT)       Precautions / Restrictions Precautions Precautions: Back;Fall Precaution Comments: Pt educated on 3/3 back precautions.  Required Braces or Orthoses: Spinal Brace Spinal Brace: Lumbar corset;Applied in sitting position Restrictions Weight Bearing Restrictions: No    Mobility  Bed Mobility Overal bed mobility: Needs Assistance   Rolling: Mod assist Sidelying to sit: Mod assist       General bed mobility comments: cues on logroll technique and rail used to assist with mobility.  Transfers Overall transfer level: Needs assistance   Transfers: Stand Pivot Transfers Sit to Stand: Mod assist;+2 physical assistance Stand pivot transfers: Mod assist;+2 physical assistance       General transfer comment: 2 person mod assist for chair to/from bsc transfer. pt able to take 2-3 steps each time with some bil knee buckling noted. cues for eyes open/to wake up throughout transfers.  Ambulation/Gait                 Stairs            Wheelchair Mobility    Modified Rankin (Stroke Patients Only)       Balance Overall balance assessment: Needs assistance Sitting-balance support: Feet supported;Bilateral upper extremity supported Sitting balance-Leahy Scale: Fair Sitting balance - Comments: when awake pt able to hold self upright, as pt nods off she leans all directions (random each  time).  Edge of bed for 6-8 minutes working on alertness and postural control/support     Standing balance-Leahy Scale: Poor                      Cognition Arousal/Alertness: Lethargic;Suspect due to medications Behavior During Therapy: WFL for tasks assessed/performed Overall Cognitive Status: Within Functional Limits for tasks assessed                             Pertinent Vitals/Pain Pain Assessment: No/denies pain Pain Score: 1  Pain Location: back Pain Descriptors / Indicators: Sore Pain Intervention(s): Premedicated before session;Repositioned           PT Goals (current goals can now be found in the care plan section) Acute Rehab PT Goals Patient Stated Goal: not stated PT Goal Formulation: With patient Potential to Achieve Goals: Good Progress towards PT goals: Progressing toward goals    Frequency  Min 5X/week    PT Plan Current plan remains appropriate       End of Session Equipment Utilized During Treatment: Gait belt;Back brace Activity Tolerance: Patient tolerated treatment well;Patient limited by lethargy Patient left: in chair;with call bell/phone within reach;with family/visitor present;with chair alarm set     Time: [id number] PT Time Calculation (min): 12 min  Charges:  $Therapeutic Activity: 8-22 mins                    G Codes:  Willow Ora 08/05/2014, 2:26 PM  Willow Ora, PTA Office- [phone]

## 2014-08-06 ENCOUNTER — Encounter (HOSPITAL_COMMUNITY): Payer: Self-pay | Admitting: General Practice

## 2014-08-06 DIAGNOSIS — Q762 Congenital spondylolisthesis: Secondary | ICD-10-CM | POA: Diagnosis not present

## 2014-08-06 LAB — GLUCOSE, CAPILLARY
GLUCOSE-CAPILLARY: 68 mg/dL — AB (ref 70–99)
Glucose-Capillary: 59 mg/dL — ABNORMAL LOW (ref 70–99)
Glucose-Capillary: 69 mg/dL — ABNORMAL LOW (ref 70–99)
Glucose-Capillary: 70 mg/dL (ref 70–99)

## 2014-08-06 MED ORDER — WHITE PETROLATUM GEL
Status: AC
Start: 2014-08-06 — End: 2014-08-06
  Administered 2014-08-06: 0.2
  Filled 2014-08-06: qty 5

## 2014-08-06 MED FILL — Sodium Chloride IV Soln 0.9%: INTRAVENOUS | Qty: 2000 | Status: AC

## 2014-08-06 MED FILL — Heparin Sodium (Porcine) Inj 1000 Unit/ML: INTRAMUSCULAR | Qty: 30 | Status: AC

## 2014-08-06 NOTE — Care Management Note (Addendum)
  Page 1 of 1   08/06/2014     7:47:43 PM CARE MANAGEMENT NOTE 08/06/2014  Patient:  [name] [name], [name] [name]   Account Number:  [id number]  Date Initiated:  08/06/2014  Documentation initiated by:  Lorne Skeens  Subjective/Objective Assessment:   Patient was admitted for a PLIF. Lives at home with friends.     Action/Plan:   Will follow for discharge needs pending PT/OT evals and physician orders.   Anticipated DC Date:     Anticipated DC Plan:  [place]  CM consult      Choice offered to / List presented to:             Status of service:  In process, will continue to follow Medicare Important Message given?  YES (If response is "NO", the following Medicare IM given date fields will be blank) Date Medicare IM given:  08/06/2014 Medicare IM given by:  Lorne Skeens Date Additional Medicare IM given:   Additional Medicare IM given by:    Discharge Disposition:    Per UR Regulation:    If discussed at Long Length of Stay Meetings, dates discussed:    Comments:  08/06/14 [place], MSN, CM- Medicare IM letter provided.

## 2014-08-06 NOTE — Progress Notes (Signed)
Utilization review completed. Lucie Friedlander, RN, BSN. 

## 2014-08-06 NOTE — Progress Notes (Signed)
Patient ID: [name] [name], female   DOB: 1946/05/11, 68 y.o.   MRN: [id number] C/o diarrhea. No lbp. No weakness. oob with help

## 2014-08-06 NOTE — Progress Notes (Signed)
Occupational Therapy Treatment Patient Details Name: [name] [name] MRN: [id number] DOB: [birth year] Today's Date: 08/06/2014    History of present illness Pt is 68 y/o female admitted for s/p lumbar L3-4 Fusion   OT comments  Pt progressing. Discussed possibly going to SNF for d/c with pt and daughter. Daughter states that someone else will be at home with pt as well (pt's daughter not in great health to assist). If family can't provide adequate help with 24/7 assist, feel SNF would be safe d/c option.  Follow Up Recommendations  SNF;Supervision/Assistance - 24 hour    Equipment Recommendations  3 in 1 bedside comode    Recommendations for Other Services      Precautions / Restrictions Precautions Precautions: Back;Fall Precaution Comments: Reviewed precautions with pt Required Braces or Orthoses: Spinal Brace Spinal Brace: Lumbar corset;Applied in sitting position Restrictions Weight Bearing Restrictions: No       Mobility Bed Mobility Overal bed mobility: Needs Assistance Bed Mobility: Rolling;Sidelying to Sit;Sit to Sidelying Rolling: Min assist;Min guard Sidelying to sit: Mod assist     Sit to sidelying: Min guard General bed mobility comments: cues for technique. Assisted with trunk and hips when going from sidelying to sitting position.  Transfers Overall transfer level: Needs assistance Equipment used: Rolling walker (2 wheeled) Transfers: Sit to/from Stand Sit to Stand: Min assist;Min guard         General transfer comment: cues for technique/hand placement.        ADL Overall ADL's : Needs assistance/impaired     Grooming: Oral care;Wash/dry hands;Wash/dry face;Sitting;Standing;Minimal assistance;Min guard           Upper Body Dressing : Sitting;Standing;Minimal assistance   Lower Body Dressing: Sit to/from stand;With adaptive equipment;Min guard   Toilet Transfer: Min guard;Ambulation;RW;Comfort height toilet;Grab bars   Toileting-  Clothing Manipulation and Hygiene: Moderate assistance;Sitting/lateral lean (sitting/standing)       Functional mobility during ADLs: Min guard;Minimal assistance;Rolling walker General ADL Comments: Educated on use of bag on walker. Educated on AE for LB ADLs and pt donned panties and donned/doffed socks. Daughter present for session-discussed d/c. Educated on what to use for toilet aid for hygiene. Educated on use of cup for teeth care and placement of grooming items to avoid breaking precautions. Educated on back brace.  Discussed how pt could perform dressing in supine position.  Pt stood at sink and required cues to stand upright. Pt performed part of grooming sitting due to back pain.      Vision                     Perception     Praxis      Cognition   Behavior During Therapy: Impulsive Overall Cognitive Status: Impaired/Different from baseline Area of Impairment: Memory;Safety/judgement;Problem solving Memory: Decreased short-term memory Safety/Judgement: Decreased awareness of safety Problem Solving: Slow processing    Extremity/Trunk Assessment               Exercises     Shoulder Instructions       General Comments      Pertinent Vitals/ Pain       Pain Assessment: 0-10 Pain Score: 9  Pain Location: back Pain Descriptors / Indicators:  (could not describe)  Home Living Family/patient expects to be discharged to:: Private residence Living Arrangements: Other relatives  Prior Functioning/Environment              Frequency Min 2X/week     Progress Toward Goals  OT Goals(current goals can now be found in the care plan section)  Progress towards OT goals: Progressing toward goals  Acute Rehab OT Goals Patient Stated Goal: not stated OT Goal Formulation: With patient Time For Goal Achievement: 08/11/14 Potential to Achieve Goals: Good ADL Goals Pt Will Perform Lower Body  Dressing: with min assist;with adaptive equipment;sitting/lateral leans;sit to/from stand Pt Will Transfer to Toilet: with min assist;ambulating;bedside commode Pt Will Perform Toileting - Clothing Manipulation and hygiene: with min assist;sitting/lateral leans;sit to/from stand Additional ADL Goal #1: Pt will independently verbalize/demonstrate 3/3 back precautions.   Additional ADL Goal #2: Pt/caregiver will be supervision level for bed mobility as precursor for ADLs.   Plan Discharge plan needs to be updated    Co-evaluation                 End of Session Equipment Utilized During Treatment: Gait belt;Rolling walker;Back brace   Activity Tolerance Patient tolerated treatment well   Patient Left in chair;with call bell/phone within reach;with family/visitor present;with bed alarm set   Nurse Communication          Time: [id number] OT Time Calculation (min): 32 min  Charges: OT General Charges $OT Visit: 1 Procedure OT Treatments $Self Care/Home Management : 23-37 mins  Benito Mccreedy OTR/L [id number] 08/06/2014, 3:45 PM

## 2014-08-06 NOTE — Clinical Social Work Placement (Addendum)
Clinical Social Work Department CLINICAL SOCIAL WORK PLACEMENT NOTE 08/06/2014  Patient:  [name] [name], [name] [name]  Account Number:  [id number] Admit date:  08/03/2014  Clinical Social Worker:  Delrae Sawyers  Date/time:  08/06/2014 04:34 PM  Clinical Social Work is seeking post-discharge placement for this patient at the following level of care:   [place]   (*CSW will update this form in Epic as items are completed)   08/06/2014  Patient/family provided with [facility] Department of Clinical Social Work's list of facilities offering this level of care within the geographic area requested by the patient (or if unable, by the patient's family).  08/06/2014  Patient/family informed of their freedom to choose among providers that offer the needed level of care, that participate in Medicare, Medicaid or managed care program needed by the patient, have an available bed and are willing to accept the patient.  08/06/2014  Patient/family informed of [facility]' ownership interest in [facility], as well as of the fact that they are under no obligation to receive care at this facility.  PASARR submitted to EDS on 08/06/2014 PASARR number received on 08/06/2014  FL2 transmitted to all facilities in geographic area requested by pt/family on  08/06/2014 FL2 transmitted to all facilities within larger geographic area on 08/06/2014  Patient informed that his/her managed care company has contracts with or will negotiate with  certain facilities, including the following:     Patient/family informed of bed offers received:  08/07/2014 Patient chooses bed at [facility] Physician recommends and patient chooses bed at    Patient to be transferred to [facility] on   Patient to be transferred to facility by  Patient and family notified of transfer on  Name of family member notified:    The following physician request were entered in Epic:   Additional  Comments:  Lubertha Sayres, MSW, [facility] Licensed Clinical Social Worker [phone] and [phone]

## 2014-08-06 NOTE — Clinical Social Work Note (Signed)
CSW consulted for possible SNF placement at time of discharge. Per chart review, pt to be discharged home with home health services. CSW signing off. Thank you for the referral.  Lubertha Sayres, MSW, [facility] Licensed Clinical Social Worker [phone] and [phone]

## 2014-08-06 NOTE — Progress Notes (Signed)
Physical Therapy Treatment Patient Details Name: [name] [name] MRN: [id number] DOB: 24-Dec-1945 Today's Date: 08/06/2014    History of Present Illness Pt is 68 y/o female admitted for s/p lumbar L3-4 Fusion    PT Comments    Pt able to tolerate ambulation this date however extremely unsafe with poor carryover of suggestions for safe walker management. Pt unaware of what month it was. Pt at extremely high falls risk requiring modA for safe ambulation due to poor walker management. Pt reports she lives by herself as her husband passed away. Pt currently unsafe to d/c home alone at this time. Pt to benefit from ST-SNF to achieve safe mod I function for safe transition home.   Follow Up Recommendations  SNF (unless 24/7 supervision can be provided)     Equipment Recommendations       Recommendations for Other Services       Precautions / Restrictions Precautions Precautions: Back;Fall Precaution Comments: pt with no recall of back precautions despite education Spinal Brace:  (no lumbar brace in room upon PT arrival) Restrictions Weight Bearing Restrictions: No    Mobility  Bed Mobility Overal bed mobility: Needs Assistance Bed Mobility: Rolling;Sit to Sidelying Rolling: Min guard Sidelying to sit: Min guard       General bed mobility comments: definite use of bed rail  Transfers Overall transfer level: Needs assistance Equipment used: Rolling walker (2 wheeled) Transfers: Sit to/from Stand Sit to Stand: Min assist         General transfer comment: max directional v/c's for safe hand placement due to impulsivity  Ambulation/Gait Ambulation/Gait assistance: Mod assist;+2 safety/equipment Ambulation Distance (Feet): 200 Feet Assistive device: Rolling walker (2 wheeled) Gait Pattern/deviations: Step-through pattern;Trunk flexed Gait velocity: impulsively fast   General Gait Details: pt pushing RW to far out in front of pt with significant trunk flexion requiring  modA for walker management and max directional v/c's to stay inside walker. Took multiple standing rest breaks to address pt's impulsivity and unsafe walker management techniques   Stairs            Wheelchair Mobility    Modified Rankin (Stroke Patients Only)       Balance Overall balance assessment: Needs assistance           Standing balance-Leahy Scale: Poor Standing balance comment: pt unsafe with RW or without UE support                    Cognition Arousal/Alertness: Awake/alert Behavior During Therapy: Impulsive Overall Cognitive Status: Impaired/Different from baseline Area of Impairment: Orientation;Attention;Memory;Following commands;Safety/judgement;Awareness;Problem solving Orientation Level: Disoriented to;Time (pt reports it to be oct/nov) Current Attention Level: Focused Memory: Decreased recall of precautions;Decreased short-term memory Following Commands: Follows one step commands inconsistently Safety/Judgement: Decreased awareness of safety;Decreased awareness of deficits Awareness: Intellectual Problem Solving: Slow processing;Difficulty sequencing;Requires verbal cues;Requires tactile cues General Comments: pt with no carryover of recommendations of techniques for safe mobility    Exercises      General Comments        Pertinent Vitals/Pain Pain Assessment: 0-10 Pain Score: 3  Pain Location: back Pain Descriptors / Indicators: Sore    Home Living Family/patient expects to be discharged to:: Private residence Living Arrangements: Other relatives                  Prior Function            PT Goals (current goals can now be found in the care plan section) Acute  Rehab PT Goals Patient Stated Goal: not stated Progress towards PT goals: Progressing toward goals    Frequency  Min 5X/week    PT Plan Discharge plan needs to be updated    Co-evaluation             End of Session Equipment Utilized During  Treatment: Gait belt Activity Tolerance: Patient tolerated treatment well Patient left: in chair;with call bell/phone within reach;with chair alarm set     Time: 1202-1225 PT Time Calculation (min): 23 min  Charges:  $Gait Training: 8-22 mins $Therapeutic Activity: 8-22 mins                    G Codes:      Kingsley Callander 08/06/2014, 2:41 PM  Kittie Plater, PT, DPT Pager #: [phone] Office #: [phone]

## 2014-08-06 NOTE — Clinical Social Work Psychosocial (Signed)
Clinical Social Work Department BRIEF PSYCHOSOCIAL ASSESSMENT 08/06/2014  Patient:  [name] [name], [name] [name]     Account Number:  [id number]     Admit date:  08/03/2014  Clinical Social Worker:  Delrae Sawyers  Date/Time:  08/06/2014 04:21 PM  Referred by:  Physician  Date Referred:  08/06/2014 Referred for  SNF Placement   Other Referral:   none.   Interview type:  Patient Other interview type:   Pt's daughter, [name] [name] [phone]), also present at bedside.    PSYCHOSOCIAL DATA Living Status:  FAMILY Admitted from facility:   Level of care:   Primary support name:  [name] [name] Primary support relationship to patient:  CHILD, ADULT Degree of support available:   Adequate support system.    CURRENT CONCERNS Current Concerns  Post-Acute Placement   Other Concerns:   none.    SOCIAL WORK ASSESSMENT / PLAN CSW consulted for possible SNF placement at time of discharge. CSW met with pt and pt's daughter at bedside to discuss discharge disposition. Pt stated she is open to SNF placement at time of discharge. Pt's daughter stated pt is from home with family (two daughter and granddaughters), who will be available to care for pt at completion of rehabilitation.    CSW to continue to follow and assist with discharge planning needs.   Assessment/plan status:  Psychosocial Support/Ongoing Assessment of Needs Other assessment/ plan:   none.   Information/referral to community resources:   [facility].    PATIENT'S/FAMILY'S RESPONSE TO PLAN OF CARE: Pt and pt's daughter understanding and agreeable to CSW plan of care. Pt and pt's daughter expressed no further questions or concerns at this time.       [name] [name], MSW, [facility] Licensed Clinical Social Worker [phone] and [phone]

## 2014-08-07 LAB — GLUCOSE, CAPILLARY
GLUCOSE-CAPILLARY: 125 mg/dL — AB (ref 70–99)
Glucose-Capillary: 133 mg/dL — ABNORMAL HIGH (ref 70–99)
Glucose-Capillary: 45 mg/dL — ABNORMAL LOW (ref 70–99)
Glucose-Capillary: 73 mg/dL (ref 70–99)
Glucose-Capillary: 101 mg/dL — ABNORMAL HIGH (ref 70–99)
Glucose-Capillary: 157 mg/dL — ABNORMAL HIGH (ref 70–99)
Glucose-Capillary: 135 mg/dL — ABNORMAL HIGH (ref 70–99)

## 2014-08-07 MED ORDER — INSULIN ASPART 100 UNIT/ML ~~LOC~~ SOLN
0.0000 [IU] | Freq: Three times a day (TID) | SUBCUTANEOUS | Status: DC
Start: 2014-08-07 — End: 2014-08-08
  Administered 2014-08-07: 2 [IU] via SUBCUTANEOUS

## 2014-08-07 MED ORDER — INSULIN ASPART 100 UNIT/ML ~~LOC~~ SOLN
0.0000 [IU] | Freq: Every day | SUBCUTANEOUS | Status: DC
Start: 2014-08-07 — End: 2014-08-08

## 2014-08-07 MED ORDER — ONDANSETRON HCL 4 MG PO TABS
4.0000 mg | ORAL_TABLET | Freq: Three times a day (TID) | ORAL | Status: DC | PRN
  Administered 2014-08-07: 4 mg via ORAL
  Filled 2014-08-07: qty 1

## 2014-08-07 NOTE — Progress Notes (Signed)
Physical Therapy Treatment Patient Details Name: [name] [name] MRN: [id number] DOB: Nov 24, 1946 Today's Date: 08/07/2014    History of Present Illness Pt is 68 y/o female admitted for s/p lumbar L3-4 Fusion    PT Comments    Pt with projectile vomiting during session s/p receiving medication. RN present. Pt however much improved cognitively and no longer impulsive however remains to have poor recall of back precautions and safe transfer techniques. Daughter present this date and reports she will have 3 people providing 24/7 supervision upon d/c. Pt to strongly benefit from HHPT to maximize functional recovery.   Follow Up Recommendations  Home health PT;Supervision/Assistance - 24 hour     Equipment Recommendations  3in1 (PT)    Recommendations for Other Services       Precautions / Restrictions Precautions Precautions: Back;Fall Precaution Comments: reviewed precautions however pt with no recall Required Braces or Orthoses: Spinal Brace Spinal Brace: Applied in sitting position;Lumbar corset Restrictions Weight Bearing Restrictions: No    Mobility  Bed Mobility Overal bed mobility: Needs Assistance Bed Mobility: Rolling;Sidelying to Sit Rolling: Min guard Sidelying to sit: Min guard       General bed mobility comments: v/c's for technique to adhere to back prec  Transfers Overall transfer level: Needs assistance Equipment used: Rolling walker (2 wheeled) Transfers: Sit to/from Stand Sit to Stand: Min guard         General transfer comment: max v/c's for safe hand placement  Ambulation/Gait Ambulation/Gait assistance: Min guard Ambulation Distance (Feet): 200 Feet Assistive device: Rolling walker (2 wheeled) Gait Pattern/deviations: Step-through pattern Gait velocity: pt much more controlled this date compared to yesterday   General Gait Details: pt not impulsive this date. Pt remains to have increased trunk flexion and increased UE WBing  despite max  v/c's to maintain upright posture   Stairs Stairs: Yes Stairs assistance: Min assist Stair Management: Two rails Number of Stairs: 2 General stair comments: max directional v/c's for sequencing, minA to descend due to R LE pain  Wheelchair Mobility    Modified Rankin (Stroke Patients Only)       Balance                                    Cognition Arousal/Alertness: Awake/alert Behavior During Therapy: WFL for tasks assessed/performed Overall Cognitive Status: Impaired/Different from baseline       Memory: Decreased short-term memory   Safety/Judgement: Decreased awareness of safety     General Comments: pt with poor recall of precautions    Exercises      General Comments General comments (skin integrity, edema, etc.): pt with request to use bathroom. pt assisted with min guard, v/c's for safe transfer on/off commode, pt able to perform pericare in sitting      Pertinent Vitals/Pain Pain Assessment: 0-10 Pain Score: 9  Pain Location: bilat LEs Pain Descriptors / Indicators: Aching Pain Intervention(s): Monitored during session    Home Living                      Prior Function            PT Goals (current goals can now be found in the care plan section) Progress towards PT goals: Progressing toward goals    Frequency  Min 5X/week    PT Plan Discharge plan needs to be updated    Co-evaluation  End of Session Equipment Utilized During Treatment: Gait belt Activity Tolerance: Patient tolerated treatment well Patient left: in chair;with call bell/phone within reach;with chair alarm set     Time: [id number] PT Time Calculation (min): 38 min  Charges:  $Gait Training: 8-22 mins $Therapeutic Activity: 23-37 mins                    G Codes:      Kingsley Callander 08/07/2014, 1:46 PM  Kittie Plater, PT, DPT Pager #: [phone] Office #: [phone]

## 2014-08-07 NOTE — Progress Notes (Deleted)

## 2014-08-07 NOTE — Progress Notes (Signed)
Patient has been having low blood sugar results. Asymptomatic and 15 grams carbs given each episode. Evening Metformin and Glipizide held. Patient states she had high CBG results when she came to the hospital and was given insulin so she thinks her glucose level is dropping from that. Ate all her dinner and bedtime snacks. Will continue to monitor.

## 2014-08-07 NOTE — Progress Notes (Signed)
Patient ID: [name] [name], female   DOB: 1946/10/12, 68 y.o.   MRN: [id number] Better with diarrhea but now vomiting. Walking with lee difficulties. Dc in am

## 2014-08-07 NOTE — Progress Notes (Signed)
Inpatient Diabetes Program Recommendations  AACE/ADA: New Consensus Statement on Inpatient Glycemic Control (2013)  Target Ranges:  Prepandial:   less than 140 mg/dL      Peak postprandial:   less than 180 mg/dL (1-2 hours)      Critically ill patients:  140 - 180 mg/dL   Reason for Assessment: Hypoglycemia with oral hypoglycemic agents  Diabetes history: Type 2 diabetes Outpatient Diabetes medications: Glucotrol 10 mg bid, Metformin 1000 bid Current orders for Inpatient glycemic control: Glucotrol 10 mg bid, Metformin 1000 bid. Novolog resistant correction tid, Novolog HS correction  Results for ALEXONDRIA, SERNA (MRN [id number]) as of 08/07/2014 14:41  Ref. Range 08/06/2014 11:50 08/06/2014 16:57 08/06/2014 18:24 08/06/2014 21:33 08/06/2014 22:32 08/06/2014 23:32 08/07/2014 06:40  Glucose-Capillary Latest Range: 70-99 mg/dL 133 (H) 45 (L) 73 59 (L) 68 (L) 69 (L) 101 (H)   Note:  Patient experiencing persistent hypoglycemia which is a typical problem with OHA's used in the hospital setting.  Request MD consider the following:  Stop Glucotrol and Metformin until discharge\  Reduce correction from resistant to sensitive Thank you.  Leeann Bady S. Marcelline Mates, RN, CNS, CDE Inpatient Diabetes Program, team pager [phone]

## 2014-08-07 NOTE — Progress Notes (Signed)
Hypoglycemic Event  CBG: 45  Treatment: 15 GM carbohydrate snack  Symptoms: None  Follow-up CBG: Time:1824 CBG Result:73  Possible Reasons for Event: Unknown  Comments/MD notified:Hypoglycemia protocol    [name] [name], [name] [name]  Remember to initiate Hypoglycemia Order Set & complete

## 2014-08-07 NOTE — Progress Notes (Signed)
Hypoglycemic Event  CBG: 59  Treatment: 15 GM carbohydrate snack  Symptoms: None  Follow-up CBG: Time:2320 CBG Result:68  Possible Reasons for Event: Unknown  Comments/MD notified:Hypoglycemia protocol    Tom-Johnson, Earnestine Tuohey Daphne  Remember to initiate Hypoglycemia Order Set & complete

## 2014-08-08 LAB — GLUCOSE, CAPILLARY
Glucose-Capillary: 105 mg/dL — ABNORMAL HIGH (ref 70–99)
Glucose-Capillary: 114 mg/dL — ABNORMAL HIGH (ref 70–99)

## 2014-08-08 NOTE — Discharge Summary (Signed)
Physician Discharge Summary  Patient ID: [name] [name] MRN: [id number] DOB/AGE: March 12, 1946 68 y.o.  Admit date: 08/03/2014 Discharge date: 08/08/2014  Admission Diagnoses:lumbar spondylolisthesis  Discharge Diagnoses:  Active Problems:   Spondylolisthesis of lumbar region   Discharged Condition: no pain, no weakness  Hospital Course: surgery  Consults: none  Significant Diagnostic Studies: myelogram  Treatments: lumbar fusion  Discharge Exam: Blood pressure 117/61, pulse 69, temperature 97.8 F (36.6 C), temperature source Oral, resp. rate 20, height 4\' 9"  (1.448 m), weight 62.1 kg (136 lb 14.5 oz), SpO2 98.00%. No weakness. ambulating  Disposition: home. Needs to talk to her MD ABOUT HER DIABETES MEDICATION     Medication List    ASK your doctor about these medications       amLODipine 10 MG tablet  Commonly known as:  NORVASC  Take 10 mg by mouth daily.     cimetidine 200 MG tablet  Commonly known as:  TAGAMET  Take 200 mg by mouth daily as needed (Reflux).     [employer] VIT D 5000 HIGH-POTENCY 5000 UNITS capsule  Generic drug:  Cholecalciferol  Take 5,000 Units by mouth daily.     enalapril 20 MG tablet  Commonly known as:  VASOTEC  Take 20 mg by mouth daily.     EQ ESTROBLEND MENOPAUSE Tabs  Take 1 tablet by mouth daily.     gabapentin 300 MG capsule  Commonly known as:  NEURONTIN  Take 600 mg by mouth 3 (three) times daily.     glipiZIDE 10 MG tablet  Commonly known as:  GLUCOTROL  Take 10 mg by mouth 2 (two) times daily before a meal.     hydrochlorothiazide 25 MG tablet  Commonly known as:  HYDRODIURIL  Take 12.5 mg by mouth daily.     levothyroxine 100 MCG tablet  Commonly known as:  SYNTHROID, LEVOTHROID  Take 100 mcg by mouth daily.     metFORMIN 1000 MG tablet  Commonly known as:  GLUCOPHAGE  Take 1,000 mg by mouth 2 (two) times daily with a meal.     potassium chloride SA 20 MEQ tablet  Commonly known as:  K-DUR,KLOR-CON  Take 20  mEq by mouth daily.     prenatal multivitamin Tabs tablet  Take 1 tablet by mouth daily.     simvastatin 20 MG tablet  Commonly known as:  ZOCOR  Take 20 mg by mouth at bedtime.     vitamin C 500 MG tablet  Commonly known as:  ASCORBIC ACID  Take 500 mg by mouth daily.     vitamin E 400 UNIT capsule  Take 400 Units by mouth daily.         Signed: Floyce Stakes 08/08/2014, 11:47 AM

## 2014-08-08 NOTE — Progress Notes (Signed)
Patient is discharged from room 4N08 at this time. Alert and in stable condition. IV site d/c'd. Instructions read to patient and understanding verbalized. Left unit via wheelchair with family and belongings at side.

## 2014-08-16 ENCOUNTER — Ambulatory Visit: Payer: Medicare Other | Admitting: Gastroenterology

## 2014-08-23 DIAGNOSIS — M545 Low back pain, unspecified: Secondary | ICD-10-CM | POA: Diagnosis not present

## 2014-09-10 DIAGNOSIS — H353190 Nonexudative age-related macular degeneration, unspecified eye, stage unspecified: Secondary | ICD-10-CM | POA: Diagnosis not present

## 2014-09-20 DIAGNOSIS — M4310 Spondylolisthesis, site unspecified: Secondary | ICD-10-CM | POA: Diagnosis not present

## 2014-09-20 DIAGNOSIS — Z48811 Encounter for surgical aftercare following surgery on the nervous system: Secondary | ICD-10-CM | POA: Diagnosis not present

## 2014-09-20 DIAGNOSIS — Z6826 Body mass index (BMI) 26.0-26.9, adult: Secondary | ICD-10-CM | POA: Diagnosis not present

## 2014-09-20 DIAGNOSIS — I10 Essential (primary) hypertension: Secondary | ICD-10-CM | POA: Diagnosis not present

## 2014-09-25 DIAGNOSIS — M5136 Other intervertebral disc degeneration, lumbar region: Secondary | ICD-10-CM | POA: Diagnosis not present

## 2014-09-25 DIAGNOSIS — M6281 Muscle weakness (generalized): Secondary | ICD-10-CM | POA: Diagnosis not present

## 2014-09-25 DIAGNOSIS — R2689 Other abnormalities of gait and mobility: Secondary | ICD-10-CM | POA: Diagnosis not present

## 2014-10-04 DIAGNOSIS — M6281 Muscle weakness (generalized): Secondary | ICD-10-CM | POA: Diagnosis not present

## 2014-10-04 DIAGNOSIS — R2689 Other abnormalities of gait and mobility: Secondary | ICD-10-CM | POA: Diagnosis not present

## 2014-10-04 DIAGNOSIS — M5136 Other intervertebral disc degeneration, lumbar region: Secondary | ICD-10-CM | POA: Diagnosis not present

## 2014-10-08 DIAGNOSIS — H40003 Preglaucoma, unspecified, bilateral: Secondary | ICD-10-CM | POA: Diagnosis not present

## 2014-10-11 DIAGNOSIS — M6281 Muscle weakness (generalized): Secondary | ICD-10-CM | POA: Diagnosis not present

## 2014-10-11 DIAGNOSIS — M5136 Other intervertebral disc degeneration, lumbar region: Secondary | ICD-10-CM | POA: Diagnosis not present

## 2014-10-11 DIAGNOSIS — E1140 Type 2 diabetes mellitus with diabetic neuropathy, unspecified: Secondary | ICD-10-CM | POA: Diagnosis not present

## 2014-10-11 DIAGNOSIS — Z23 Encounter for immunization: Secondary | ICD-10-CM | POA: Diagnosis not present

## 2014-10-11 DIAGNOSIS — Z6826 Body mass index (BMI) 26.0-26.9, adult: Secondary | ICD-10-CM | POA: Diagnosis not present

## 2014-10-11 DIAGNOSIS — E063 Autoimmune thyroiditis: Secondary | ICD-10-CM | POA: Diagnosis not present

## 2014-10-11 DIAGNOSIS — R2689 Other abnormalities of gait and mobility: Secondary | ICD-10-CM | POA: Diagnosis not present

## 2014-10-11 DIAGNOSIS — J019 Acute sinusitis, unspecified: Secondary | ICD-10-CM | POA: Diagnosis not present

## 2014-10-11 DIAGNOSIS — I10 Essential (primary) hypertension: Secondary | ICD-10-CM | POA: Diagnosis not present

## 2014-10-11 DIAGNOSIS — J209 Acute bronchitis, unspecified: Secondary | ICD-10-CM | POA: Diagnosis not present

## 2014-10-11 DIAGNOSIS — E782 Mixed hyperlipidemia: Secondary | ICD-10-CM | POA: Diagnosis not present

## 2014-10-16 DIAGNOSIS — M6281 Muscle weakness (generalized): Secondary | ICD-10-CM | POA: Diagnosis not present

## 2014-10-16 DIAGNOSIS — R2689 Other abnormalities of gait and mobility: Secondary | ICD-10-CM | POA: Diagnosis not present

## 2014-10-16 DIAGNOSIS — M5136 Other intervertebral disc degeneration, lumbar region: Secondary | ICD-10-CM | POA: Diagnosis not present

## 2014-11-06 ENCOUNTER — Telehealth: Payer: Self-pay | Admitting: Internal Medicine

## 2014-11-06 ENCOUNTER — Ambulatory Visit: Payer: Medicare Other | Admitting: Gastroenterology

## 2014-11-06 ENCOUNTER — Encounter: Payer: Self-pay | Admitting: Gastroenterology

## 2014-11-06 NOTE — Telephone Encounter (Signed)
PATIENT WAS A NO SHOW 11/06/14 AND LETTER SENT

## 2014-11-13 DIAGNOSIS — E11311 Type 2 diabetes mellitus with unspecified diabetic retinopathy with macular edema: Secondary | ICD-10-CM | POA: Diagnosis not present

## 2014-11-13 DIAGNOSIS — E663 Overweight: Secondary | ICD-10-CM | POA: Diagnosis not present

## 2014-11-13 DIAGNOSIS — Z6825 Body mass index (BMI) 25.0-25.9, adult: Secondary | ICD-10-CM | POA: Diagnosis not present

## 2014-11-13 DIAGNOSIS — F5101 Primary insomnia: Secondary | ICD-10-CM | POA: Diagnosis not present

## 2014-11-13 DIAGNOSIS — E876 Hypokalemia: Secondary | ICD-10-CM | POA: Diagnosis not present

## 2014-11-13 DIAGNOSIS — M1991 Primary osteoarthritis, unspecified site: Secondary | ICD-10-CM | POA: Diagnosis not present

## 2014-12-11 DIAGNOSIS — Z6825 Body mass index (BMI) 25.0-25.9, adult: Secondary | ICD-10-CM | POA: Diagnosis not present

## 2014-12-11 DIAGNOSIS — E663 Overweight: Secondary | ICD-10-CM | POA: Diagnosis not present

## 2014-12-11 DIAGNOSIS — G894 Chronic pain syndrome: Secondary | ICD-10-CM | POA: Diagnosis not present

## 2015-01-11 DIAGNOSIS — Z6826 Body mass index (BMI) 26.0-26.9, adult: Secondary | ICD-10-CM | POA: Diagnosis not present

## 2015-01-11 DIAGNOSIS — E063 Autoimmune thyroiditis: Secondary | ICD-10-CM | POA: Diagnosis not present

## 2015-01-11 DIAGNOSIS — E1129 Type 2 diabetes mellitus with other diabetic kidney complication: Secondary | ICD-10-CM | POA: Diagnosis not present

## 2015-01-11 DIAGNOSIS — M2550 Pain in unspecified joint: Secondary | ICD-10-CM | POA: Diagnosis not present

## 2015-01-11 DIAGNOSIS — I10 Essential (primary) hypertension: Secondary | ICD-10-CM | POA: Diagnosis not present

## 2015-01-11 DIAGNOSIS — M1990 Unspecified osteoarthritis, unspecified site: Secondary | ICD-10-CM | POA: Diagnosis not present

## 2015-01-14 DIAGNOSIS — E1129 Type 2 diabetes mellitus with other diabetic kidney complication: Secondary | ICD-10-CM | POA: Diagnosis not present

## 2015-02-13 DIAGNOSIS — J3089 Other allergic rhinitis: Secondary | ICD-10-CM | POA: Diagnosis not present

## 2015-02-13 DIAGNOSIS — E663 Overweight: Secondary | ICD-10-CM | POA: Diagnosis not present

## 2015-02-13 DIAGNOSIS — G894 Chronic pain syndrome: Secondary | ICD-10-CM | POA: Diagnosis not present

## 2015-02-13 DIAGNOSIS — Z6826 Body mass index (BMI) 26.0-26.9, adult: Secondary | ICD-10-CM | POA: Diagnosis not present

## 2015-03-11 DIAGNOSIS — F419 Anxiety disorder, unspecified: Secondary | ICD-10-CM | POA: Diagnosis not present

## 2015-03-11 DIAGNOSIS — Z0001 Encounter for general adult medical examination with abnormal findings: Secondary | ICD-10-CM | POA: Diagnosis not present

## 2015-03-11 DIAGNOSIS — G894 Chronic pain syndrome: Secondary | ICD-10-CM | POA: Diagnosis not present

## 2015-03-11 DIAGNOSIS — E1140 Type 2 diabetes mellitus with diabetic neuropathy, unspecified: Secondary | ICD-10-CM | POA: Diagnosis not present

## 2015-03-11 DIAGNOSIS — M1991 Primary osteoarthritis, unspecified site: Secondary | ICD-10-CM | POA: Diagnosis not present

## 2015-03-11 DIAGNOSIS — E663 Overweight: Secondary | ICD-10-CM | POA: Diagnosis not present

## 2015-03-11 DIAGNOSIS — Z6826 Body mass index (BMI) 26.0-26.9, adult: Secondary | ICD-10-CM | POA: Diagnosis not present

## 2015-03-11 DIAGNOSIS — E1129 Type 2 diabetes mellitus with other diabetic kidney complication: Secondary | ICD-10-CM | POA: Diagnosis not present

## 2015-03-15 ENCOUNTER — Other Ambulatory Visit (HOSPITAL_COMMUNITY): Payer: Self-pay | Admitting: Internal Medicine

## 2015-03-15 DIAGNOSIS — Z1231 Encounter for screening mammogram for malignant neoplasm of breast: Secondary | ICD-10-CM

## 2015-03-25 ENCOUNTER — Ambulatory Visit (HOSPITAL_COMMUNITY)
Admission: RE | Admit: 2015-03-25 | Discharge: 2015-03-25 | Disposition: A | Discharge status: Home / Self Care | Payer: Medicare Other | Source: Ambulatory Visit | Attending: Internal Medicine | Admitting: Internal Medicine

## 2015-03-25 DIAGNOSIS — Z1231 Encounter for screening mammogram for malignant neoplasm of breast: Secondary | ICD-10-CM | POA: Insufficient documentation

## 2015-03-26 DIAGNOSIS — I350 Nonrheumatic aortic (valve) stenosis: Secondary | ICD-10-CM | POA: Diagnosis not present

## 2015-03-28 DIAGNOSIS — I350 Nonrheumatic aortic (valve) stenosis: Secondary | ICD-10-CM | POA: Diagnosis not present

## 2015-04-16 DIAGNOSIS — L219 Seborrheic dermatitis, unspecified: Secondary | ICD-10-CM | POA: Diagnosis not present

## 2015-04-16 DIAGNOSIS — Z85820 Personal history of malignant melanoma of skin: Secondary | ICD-10-CM | POA: Diagnosis not present

## 2015-04-16 DIAGNOSIS — Z1283 Encounter for screening for malignant neoplasm of skin: Secondary | ICD-10-CM | POA: Diagnosis not present

## 2015-04-16 DIAGNOSIS — D485 Neoplasm of uncertain behavior of skin: Secondary | ICD-10-CM | POA: Diagnosis not present

## 2015-04-16 DIAGNOSIS — Z08 Encounter for follow-up examination after completed treatment for malignant neoplasm: Secondary | ICD-10-CM | POA: Diagnosis not present

## 2015-04-16 DIAGNOSIS — D225 Melanocytic nevi of trunk: Secondary | ICD-10-CM | POA: Diagnosis not present

## 2015-04-24 DIAGNOSIS — H40053 Ocular hypertension, bilateral: Secondary | ICD-10-CM | POA: Diagnosis not present

## 2015-05-01 DIAGNOSIS — Z08 Encounter for follow-up examination after completed treatment for malignant neoplasm: Secondary | ICD-10-CM | POA: Diagnosis not present

## 2015-05-01 DIAGNOSIS — L089 Local infection of the skin and subcutaneous tissue, unspecified: Secondary | ICD-10-CM | POA: Diagnosis not present

## 2015-05-01 DIAGNOSIS — Z85828 Personal history of other malignant neoplasm of skin: Secondary | ICD-10-CM | POA: Diagnosis not present

## 2015-05-01 DIAGNOSIS — D485 Neoplasm of uncertain behavior of skin: Secondary | ICD-10-CM | POA: Diagnosis not present

## 2015-05-27 DIAGNOSIS — H40033 Anatomical narrow angle, bilateral: Secondary | ICD-10-CM | POA: Diagnosis not present

## 2015-06-06 ENCOUNTER — Encounter: Payer: Self-pay | Admitting: Cardiovascular Disease

## 2015-06-14 DIAGNOSIS — E669 Obesity, unspecified: Secondary | ICD-10-CM | POA: Diagnosis not present

## 2015-06-14 DIAGNOSIS — E1140 Type 2 diabetes mellitus with diabetic neuropathy, unspecified: Secondary | ICD-10-CM | POA: Diagnosis not present

## 2015-06-14 DIAGNOSIS — Z1389 Encounter for screening for other disorder: Secondary | ICD-10-CM | POA: Diagnosis not present

## 2015-06-14 DIAGNOSIS — E1129 Type 2 diabetes mellitus with other diabetic kidney complication: Secondary | ICD-10-CM | POA: Diagnosis not present

## 2015-06-14 DIAGNOSIS — Z6827 Body mass index (BMI) 27.0-27.9, adult: Secondary | ICD-10-CM | POA: Diagnosis not present

## 2015-06-14 DIAGNOSIS — E782 Mixed hyperlipidemia: Secondary | ICD-10-CM | POA: Diagnosis not present

## 2015-06-14 DIAGNOSIS — I10 Essential (primary) hypertension: Secondary | ICD-10-CM | POA: Diagnosis not present

## 2015-09-10 DIAGNOSIS — Z6828 Body mass index (BMI) 28.0-28.9, adult: Secondary | ICD-10-CM | POA: Diagnosis not present

## 2015-09-10 DIAGNOSIS — E663 Overweight: Secondary | ICD-10-CM | POA: Diagnosis not present

## 2015-09-10 DIAGNOSIS — E1165 Type 2 diabetes mellitus with hyperglycemia: Secondary | ICD-10-CM | POA: Diagnosis not present

## 2015-09-10 DIAGNOSIS — R42 Dizziness and giddiness: Secondary | ICD-10-CM | POA: Diagnosis not present

## 2015-09-10 DIAGNOSIS — Z1389 Encounter for screening for other disorder: Secondary | ICD-10-CM | POA: Diagnosis not present

## 2015-10-31 DIAGNOSIS — I350 Nonrheumatic aortic (valve) stenosis: Secondary | ICD-10-CM | POA: Diagnosis not present

## 2015-10-31 DIAGNOSIS — R0989 Other specified symptoms and signs involving the circulatory and respiratory systems: Secondary | ICD-10-CM | POA: Diagnosis not present

## 2015-10-31 DIAGNOSIS — Q231 Congenital insufficiency of aortic valve: Secondary | ICD-10-CM | POA: Diagnosis not present

## 2015-11-12 DIAGNOSIS — R0989 Other specified symptoms and signs involving the circulatory and respiratory systems: Secondary | ICD-10-CM | POA: Diagnosis not present

## 2015-11-12 DIAGNOSIS — Q231 Congenital insufficiency of aortic valve: Secondary | ICD-10-CM | POA: Diagnosis not present

## 2015-11-12 DIAGNOSIS — I6523 Occlusion and stenosis of bilateral carotid arteries: Secondary | ICD-10-CM | POA: Diagnosis not present

## 2016-01-02 DIAGNOSIS — Z6827 Body mass index (BMI) 27.0-27.9, adult: Secondary | ICD-10-CM | POA: Diagnosis not present

## 2016-01-02 DIAGNOSIS — E063 Autoimmune thyroiditis: Secondary | ICD-10-CM | POA: Diagnosis not present

## 2016-01-02 DIAGNOSIS — G894 Chronic pain syndrome: Secondary | ICD-10-CM | POA: Diagnosis not present

## 2016-01-02 DIAGNOSIS — E663 Overweight: Secondary | ICD-10-CM | POA: Diagnosis not present

## 2016-01-02 DIAGNOSIS — E1165 Type 2 diabetes mellitus with hyperglycemia: Secondary | ICD-10-CM | POA: Diagnosis not present

## 2016-01-02 DIAGNOSIS — Z1389 Encounter for screening for other disorder: Secondary | ICD-10-CM | POA: Diagnosis not present

## 2016-01-02 DIAGNOSIS — I10 Essential (primary) hypertension: Secondary | ICD-10-CM | POA: Diagnosis not present

## 2016-01-02 DIAGNOSIS — I350 Nonrheumatic aortic (valve) stenosis: Secondary | ICD-10-CM | POA: Diagnosis not present

## 2016-01-02 DIAGNOSIS — E782 Mixed hyperlipidemia: Secondary | ICD-10-CM | POA: Diagnosis not present

## 2016-04-10 DIAGNOSIS — E663 Overweight: Secondary | ICD-10-CM | POA: Diagnosis not present

## 2016-04-10 DIAGNOSIS — Z79899 Other long term (current) drug therapy: Secondary | ICD-10-CM | POA: Diagnosis not present

## 2016-04-10 DIAGNOSIS — E1165 Type 2 diabetes mellitus with hyperglycemia: Secondary | ICD-10-CM | POA: Diagnosis not present

## 2016-04-10 DIAGNOSIS — I10 Essential (primary) hypertension: Secondary | ICD-10-CM | POA: Diagnosis not present

## 2016-04-10 DIAGNOSIS — F329 Major depressive disorder, single episode, unspecified: Secondary | ICD-10-CM | POA: Diagnosis not present

## 2016-04-10 DIAGNOSIS — Z6828 Body mass index (BMI) 28.0-28.9, adult: Secondary | ICD-10-CM | POA: Diagnosis not present

## 2016-04-10 DIAGNOSIS — E782 Mixed hyperlipidemia: Secondary | ICD-10-CM | POA: Diagnosis not present

## 2016-04-10 DIAGNOSIS — F419 Anxiety disorder, unspecified: Secondary | ICD-10-CM | POA: Diagnosis not present

## 2016-04-10 DIAGNOSIS — E669 Obesity, unspecified: Secondary | ICD-10-CM | POA: Diagnosis not present

## 2016-04-10 DIAGNOSIS — Z713 Dietary counseling and surveillance: Secondary | ICD-10-CM | POA: Diagnosis not present

## 2016-04-10 DIAGNOSIS — Z1389 Encounter for screening for other disorder: Secondary | ICD-10-CM | POA: Diagnosis not present

## 2016-04-10 DIAGNOSIS — Z79891 Long term (current) use of opiate analgesic: Secondary | ICD-10-CM | POA: Diagnosis not present

## 2016-05-19 DIAGNOSIS — H40013 Open angle with borderline findings, low risk, bilateral: Secondary | ICD-10-CM | POA: Diagnosis not present

## 2016-05-19 DIAGNOSIS — E1129 Type 2 diabetes mellitus with other diabetic kidney complication: Secondary | ICD-10-CM | POA: Diagnosis not present

## 2016-05-20 DIAGNOSIS — G4700 Insomnia, unspecified: Secondary | ICD-10-CM | POA: Diagnosis not present

## 2016-05-20 DIAGNOSIS — G894 Chronic pain syndrome: Secondary | ICD-10-CM | POA: Diagnosis not present

## 2016-05-20 DIAGNOSIS — Z6828 Body mass index (BMI) 28.0-28.9, adult: Secondary | ICD-10-CM | POA: Diagnosis not present

## 2016-05-22 DIAGNOSIS — H26491 Other secondary cataract, right eye: Secondary | ICD-10-CM | POA: Diagnosis not present

## 2016-05-22 DIAGNOSIS — H401124 Primary open-angle glaucoma, left eye, indeterminate stage: Secondary | ICD-10-CM | POA: Diagnosis not present

## 2016-05-22 DIAGNOSIS — H353112 Nonexudative age-related macular degeneration, right eye, intermediate dry stage: Secondary | ICD-10-CM | POA: Diagnosis not present

## 2016-05-22 DIAGNOSIS — H353122 Nonexudative age-related macular degeneration, left eye, intermediate dry stage: Secondary | ICD-10-CM | POA: Diagnosis not present

## 2016-05-22 DIAGNOSIS — H401114 Primary open-angle glaucoma, right eye, indeterminate stage: Secondary | ICD-10-CM | POA: Diagnosis not present

## 2016-06-19 DIAGNOSIS — Z1389 Encounter for screening for other disorder: Secondary | ICD-10-CM | POA: Diagnosis not present

## 2016-06-19 DIAGNOSIS — Z6828 Body mass index (BMI) 28.0-28.9, adult: Secondary | ICD-10-CM | POA: Diagnosis not present

## 2016-06-19 DIAGNOSIS — Z0000 Encounter for general adult medical examination without abnormal findings: Secondary | ICD-10-CM | POA: Diagnosis not present

## 2016-06-26 DIAGNOSIS — I10 Essential (primary) hypertension: Secondary | ICD-10-CM | POA: Diagnosis not present

## 2016-06-26 DIAGNOSIS — Z6828 Body mass index (BMI) 28.0-28.9, adult: Secondary | ICD-10-CM | POA: Diagnosis not present

## 2016-06-26 DIAGNOSIS — Z79891 Long term (current) use of opiate analgesic: Secondary | ICD-10-CM | POA: Diagnosis not present

## 2016-06-26 DIAGNOSIS — G4700 Insomnia, unspecified: Secondary | ICD-10-CM | POA: Diagnosis not present

## 2016-07-27 DIAGNOSIS — Z6829 Body mass index (BMI) 29.0-29.9, adult: Secondary | ICD-10-CM | POA: Diagnosis not present

## 2016-07-27 DIAGNOSIS — E663 Overweight: Secondary | ICD-10-CM | POA: Diagnosis not present

## 2016-07-27 DIAGNOSIS — Z79891 Long term (current) use of opiate analgesic: Secondary | ICD-10-CM | POA: Diagnosis not present

## 2016-07-27 DIAGNOSIS — G894 Chronic pain syndrome: Secondary | ICD-10-CM | POA: Diagnosis not present

## 2016-07-27 DIAGNOSIS — Z1389 Encounter for screening for other disorder: Secondary | ICD-10-CM | POA: Diagnosis not present

## 2016-07-27 DIAGNOSIS — M13841 Other specified arthritis, right hand: Secondary | ICD-10-CM | POA: Diagnosis not present

## 2016-07-27 DIAGNOSIS — E1129 Type 2 diabetes mellitus with other diabetic kidney complication: Secondary | ICD-10-CM | POA: Diagnosis not present

## 2016-07-27 DIAGNOSIS — E1165 Type 2 diabetes mellitus with hyperglycemia: Secondary | ICD-10-CM | POA: Diagnosis not present

## 2016-07-27 DIAGNOSIS — F411 Generalized anxiety disorder: Secondary | ICD-10-CM | POA: Diagnosis not present

## 2016-07-27 DIAGNOSIS — E669 Obesity, unspecified: Secondary | ICD-10-CM | POA: Diagnosis not present

## 2016-08-07 DIAGNOSIS — E119 Type 2 diabetes mellitus without complications: Secondary | ICD-10-CM | POA: Diagnosis not present

## 2016-08-19 DIAGNOSIS — E119 Type 2 diabetes mellitus without complications: Secondary | ICD-10-CM | POA: Diagnosis not present

## 2016-08-21 ENCOUNTER — Other Ambulatory Visit (HOSPITAL_COMMUNITY): Payer: Self-pay | Admitting: Registered Nurse

## 2016-08-21 DIAGNOSIS — Z1231 Encounter for screening mammogram for malignant neoplasm of breast: Secondary | ICD-10-CM

## 2016-09-10 DIAGNOSIS — G894 Chronic pain syndrome: Secondary | ICD-10-CM | POA: Diagnosis not present

## 2016-09-10 DIAGNOSIS — Z6828 Body mass index (BMI) 28.0-28.9, adult: Secondary | ICD-10-CM | POA: Diagnosis not present

## 2016-09-10 DIAGNOSIS — E663 Overweight: Secondary | ICD-10-CM | POA: Diagnosis not present

## 2016-09-10 DIAGNOSIS — Z23 Encounter for immunization: Secondary | ICD-10-CM | POA: Diagnosis not present

## 2016-09-10 DIAGNOSIS — Z79891 Long term (current) use of opiate analgesic: Secondary | ICD-10-CM | POA: Diagnosis not present

## 2016-09-10 DIAGNOSIS — F419 Anxiety disorder, unspecified: Secondary | ICD-10-CM | POA: Diagnosis not present

## 2016-10-15 DIAGNOSIS — R928 Other abnormal and inconclusive findings on diagnostic imaging of breast: Secondary | ICD-10-CM | POA: Diagnosis not present

## 2016-10-15 DIAGNOSIS — Z1231 Encounter for screening mammogram for malignant neoplasm of breast: Secondary | ICD-10-CM | POA: Diagnosis not present

## 2016-10-16 ENCOUNTER — Encounter: Payer: Self-pay | Admitting: Gastroenterology

## 2016-10-16 ENCOUNTER — Ambulatory Visit: Payer: Medicare Other | Admitting: Gastroenterology

## 2016-10-16 ENCOUNTER — Other Ambulatory Visit: Payer: Self-pay

## 2016-10-16 ENCOUNTER — Ambulatory Visit (INDEPENDENT_AMBULATORY_CARE_PROVIDER_SITE_OTHER): Payer: Medicare Other | Admitting: Gastroenterology

## 2016-10-16 VITALS — BP 180/85 | HR 75 | Temp 97.3°F | Ht <= 58 in | Wt 138.2 lb

## 2016-10-16 DIAGNOSIS — R1310 Dysphagia, unspecified: Secondary | ICD-10-CM | POA: Diagnosis not present

## 2016-10-16 DIAGNOSIS — R194 Change in bowel habit: Secondary | ICD-10-CM

## 2016-10-16 DIAGNOSIS — Z800 Family history of malignant neoplasm of digestive organs: Secondary | ICD-10-CM | POA: Diagnosis not present

## 2016-10-16 DIAGNOSIS — Z8719 Personal history of other diseases of the digestive system: Secondary | ICD-10-CM

## 2016-10-16 DIAGNOSIS — Z8711 Personal history of peptic ulcer disease: Secondary | ICD-10-CM

## 2016-10-16 DIAGNOSIS — Z86010 Personal history of colonic polyps: Secondary | ICD-10-CM

## 2016-10-16 MED ORDER — PEG 3350-KCL-NA BICARB-NACL 420 G PO SOLR: 4000.0000 mL | ORAL | 0 refills | Status: DC

## 2016-10-16 NOTE — Assessment & Plan Note (Addendum)
Likely multifactorial. Some concerns for oropharyngeal component as she strangles/coughs with liquids. Some vague solid food dysphagia with fullness in the upper esophagus temporarily. She also has a history of gastric ulcer in the past. Given these concerns would offer upper endoscopy with possible esophageal dilation in the near future.  I have discussed the risks, alternatives, benefits with regards to but not limited to the risk of reaction to medication, bleeding, infection, perforation and the patient is agreeable to proceed. Written consent to be obtained. Augment conscious sedation with phenergan 12.5mg  IV 30 min before procedure.   If nothing seen to explain her swallowing difficulties she may require modified barium swallow study as a next step.

## 2016-10-16 NOTE — Progress Notes (Signed)
Primary Care Physician:  Glo Herring., MD  Primary Gastroenterologist:  Garfield Cornea, MD   Chief Complaint  Patient presents with  . Dysphagia    HPI:  [name] [name] is a 70 y.o. female here with complaints of difficulty swallowing and change in bowel habits. She states that her stool caliber has changed significantly. Stools are very small and difficult to pass. After she passes the first solid stool she has multiple loose stools that day. Episodes of fecal incontinence with it. Denies melena. Rarely sees bright red blood per rectum, usually if she has difficulty passing the stool. She's had some lower abdominal discomfort. This is occasional. Usually when she pushes on her belly. Denies any heartburn. She feels like she has a fullness in the upper esophagus/throat area when she swallows. Otherwise feels like food slides down okay. She notes with swallowing liquids she coughs and strangles. Denies odynophagia. No weight loss.  Patient was last seen in October 2013. EGD showed gastric ulcer, gastric and duodenal erosions in the setting of Mobic. Colonoscopy with rectal polyps which were hyperplastic, colonic diverticulosis, poor prep. Unfortunately patient failed to follow-up for her 3 month OV at which time we would've scheduled her for EGD. Multiple no-shows since then.  Current Outpatient Prescriptions  Medication Sig Dispense Refill  . ALPRAZolam (XANAX) 0.5 MG tablet Take by mouth.    Marland Kitchen amLODipine (NORVASC) 10 MG tablet Take 10 mg by mouth daily.      Marland Kitchen aspirin EC 81 MG tablet Take by mouth.    . Cholecalciferol ([employer] VIT D 5000 HIGH-POTENCY) 5000 UNITS capsule Take 5,000 Units by mouth daily.    . cimetidine (TAGAMET) 200 MG tablet Take 200 mg by mouth daily as needed (Reflux).     . Cinnamon 500 MG capsule Take 500 mg by mouth daily.    . enalapril (VASOTEC) 20 MG tablet Take 20 mg by mouth daily.    Marland Kitchen gabapentin (NEURONTIN) 300 MG capsule Take 600 mg by mouth 3 (three) times  daily.     Marland Kitchen glipiZIDE (GLUCOTROL) 10 MG tablet Take 10 mg by mouth daily.     . hydrochlorothiazide 25 MG tablet Take 12.5 mg by mouth daily.     Marland Kitchen HYDROcodone-acetaminophen (NORCO) 10-325 MG tablet Take 1 tablet by mouth.     . latanoprost (XALATAN) 0.005 % ophthalmic solution 1 drop at bedtime.    Marland Kitchen levothyroxine (SYNTHROID, LEVOTHROID) 100 MCG tablet Take 100 mcg by mouth daily.     . metFORMIN (GLUCOPHAGE) 1000 MG tablet Take 1,000 mg by mouth 2 (two) times daily with a meal.    . metFORMIN (GLUCOPHAGE) 500 MG tablet Take 500 mg by mouth daily.    . Omega-3 Fatty Acids (FISH OIL) 1000 MG CAPS Take by mouth.    . potassium chloride SA (K-DUR,KLOR-CON) 20 MEQ tablet Take 20 mEq by mouth daily.     . Prenatal Vit-Fe Fumarate-FA (PRENATAL MULTIVITAMIN) TABS Take 1 tablet by mouth daily.    . simvastatin (ZOCOR) 20 MG tablet Take 20 mg by mouth at bedtime.      . timolol (TIMOPTIC) 0.5 % ophthalmic solution 1 drop 2 (two) times daily.    Marland Kitchen UNABLE TO FIND Med Name: occuvite vitamin    . vitamin C (ASCORBIC ACID) 500 MG tablet Take 500 mg by mouth daily.    . vitamin E 400 UNIT capsule Take 400 Units by mouth daily.     No current facility-administered medications for this visit.  Allergies as of 10/16/2016  . (No Known Allergies)    Past Medical History:  Diagnosis Date  . Aortic stenosis   . Arthritis   . Blood transfusion without reported diagnosis   . Bronchitis   . Diabetes mellitus   . Diverticulitis   . Glaucoma   . Heart murmur   . Hyperlipidemia   . Hypertension   . Hypothyroidism   . Melanoma ([place])    left thigh, 2010  . Osteoarthritis   . Peptic ulcer   . Rheumatoid arthritis(714.0)   . Thyroid disease     Past Surgical History:  Procedure Laterality Date  . ABDOMINAL HYSTERECTOMY  1988  . APPENDECTOMY    . BACK SURGERY  07/2008   fusion  . BACK SURGERY  06/622   complicated by post-op anemia requiring transfusion  . BREAST SURGERY     right breast  biopsy  . CHOLECYSTECTOMY  1974  . COLONOSCOPY  06/06/2007   Proximal diminutive rectal polyps cold biopsied/removed, otherwise normal rectum/ Left-sided diverticula, the remainder of the colonic mucosa appeared normal. Path-tubular adnemoa.   . COLONOSCOPY  09/14/2012   JSE:[id number] polyps-removed as described  above Colonic diverticulosis. Poor prep. next TCS five years.   . ESOPHAGOGASTRODUODENOSCOPY  09/2012   [place]ourk: gastric ulcer, gastric duodenal erosions, small hh. benign bx  . EYE SURGERY    . FOOT SURGERY  1994   left   . LUMBAR SPINE SURGERY  08/04/2014   dr Joya Salm  . macular degen    . MELANOMA EXCISION  2010    Family History  Problem Relation Age of Onset  . Colon cancer Brother     diagnosed at age 1  . Diabetes Brother   . Hypertension Mother   . Heart failure Mother   . Diabetes Mother   . Hypertension Other   . Other Other   . Heart failure Father     passed away of MI in his 32s  . Diabetes Sister   . Hypertension Sister   . Breast cancer Sister   . Heart attack Brother   . Diabetes Sister   . Hypertension Sister   . Other Sister     tumor  . Hypertension Sister   . Hypertension Daughter   . Bipolar disorder Daughter   . Anesthesia problems Neg Hx     Social History   Social History  . Marital status: Widowed    Spouse name: N/A  . Number of children: N/A  . Years of education: N/A   Occupational History  . Not on file.   Social History Main Topics  . Smoking status: Never Smoker  . Smokeless tobacco: Never Used  . Alcohol use No     Comment: "Wine a couple times a month"  . Drug use: No  . Sexual activity: Yes    Birth control/ protection: Surgical   Other Topics Concern  . Not on file   Social History Narrative   Husband deceased April 09, 2011.       ROS:  General: Negative for anorexia, weight loss, fever, chills, fatigue, weakness. Eyes: Negative for vision changes.  ENT: Negative for hoarseness, difficulty swallowing , nasal  congestion. CV: Negative for chest pain, angina, palpitations, dyspnea on exertion, peripheral edema.  Respiratory: Negative for dyspnea at rest, dyspnea on exertion, cough, sputum, wheezing.  GI: See history of present illness. GU:  Negative for dysuria, hematuria, urinary incontinence, urinary frequency, nocturnal urination.  MS: Positive  joint pain, low back pain.  Derm: Negative for rash or itching.  Neuro: Negative for weakness, abnormal sensation, seizure, frequent headaches, memory loss, confusion.  Psych: Negative for anxiety, depression, suicidal ideation, hallucinations.  Endo: Negative for unusual weight change.  Heme: Negative for bruising or bleeding. Allergy: Negative for rash or hives.    Physical Examination:  BP (!) 180/85   Pulse 75   Temp 97.3 F (36.3 C) (Oral)   Ht 4\' 9"  (1.448 m)   Wt 138 lb 3.2 oz (62.7 kg)   BMI 29.91 kg/m  repeat BP 144/88   General: Well-nourished, well-developed in no acute distress.  Head: Normocephalic, atraumatic.   Eyes: Conjunctiva pink, no icterus. Mouth: Oropharyngeal mucosa moist and pink , no lesions erythema or exudate. Neck: Supple without thyromegaly, masses, or lymphadenopathy.  Lungs: Clear to auscultation bilaterally.  Heart: Regular rate and rhythm, no rubs or gallops. 2/6 systolic ejection murmur Abdomen: Bowel sounds are normal, nontender, nondistended, no hepatosplenomegaly or masses, no abdominal bruits or    hernia , no rebound or guarding.   Rectal: Deferred Extremities: No lower extremity edema. No clubbing or deformities.  Neuro: Alert and oriented x 4 , grossly normal neurologically.  Skin: Warm and dry, no rash or jaundice.   Psych: Alert and cooperative, normal mood and affect.  Labs: January 2017, TSH 2.8. August 2017 hemoglobin A1c 7.3.  Imaging Studies: No results found.

## 2016-10-16 NOTE — Assessment & Plan Note (Addendum)
Bowel habit change, decreased stool caliber, loose stools. Colonoscopy 4 years ago with poor bowel prep. Family history of colon cancer and personal history of colon polyps. Offered colonoscopy at this time.  I have discussed the risks, alternatives, benefits with regards to but not limited to the risk of reaction to medication, bleeding, infection, perforation and the patient is agreeable to proceed. Written consent to be obtained.  Augment conscious sedation with phenergan 12.5mg  IV 30 min before procedure.

## 2016-10-16 NOTE — Patient Instructions (Signed)
1. Colonoscopy and upper endoscopy as scheduled. See separate instructions.  2. Please have your labs done.

## 2016-10-19 NOTE — Progress Notes (Signed)
CC'ED TO PCP 

## 2016-10-20 ENCOUNTER — Telehealth: Payer: Self-pay | Admitting: Internal Medicine

## 2016-10-20 DIAGNOSIS — D649 Anemia, unspecified: Secondary | ICD-10-CM | POA: Diagnosis not present

## 2016-10-20 DIAGNOSIS — Z6829 Body mass index (BMI) 29.0-29.9, adult: Secondary | ICD-10-CM | POA: Diagnosis not present

## 2016-10-20 DIAGNOSIS — E782 Mixed hyperlipidemia: Secondary | ICD-10-CM | POA: Diagnosis not present

## 2016-10-20 DIAGNOSIS — I350 Nonrheumatic aortic (valve) stenosis: Secondary | ICD-10-CM | POA: Diagnosis not present

## 2016-10-20 DIAGNOSIS — E1140 Type 2 diabetes mellitus with diabetic neuropathy, unspecified: Secondary | ICD-10-CM | POA: Diagnosis not present

## 2016-10-20 DIAGNOSIS — E063 Autoimmune thyroiditis: Secondary | ICD-10-CM | POA: Diagnosis not present

## 2016-10-20 DIAGNOSIS — I10 Essential (primary) hypertension: Secondary | ICD-10-CM | POA: Diagnosis not present

## 2016-10-20 DIAGNOSIS — Z1389 Encounter for screening for other disorder: Secondary | ICD-10-CM | POA: Diagnosis not present

## 2016-10-20 DIAGNOSIS — E1129 Type 2 diabetes mellitus with other diabetic kidney complication: Secondary | ICD-10-CM | POA: Diagnosis not present

## 2016-10-20 NOTE — Telephone Encounter (Signed)
Tried to call [name] [name] with no answer

## 2016-10-20 NOTE — Telephone Encounter (Signed)
I dont see anything in the chart that needed Dr.Fusco to look at. Nothing mentioned in [facility] ov note either. Routing to [place] to see if there was something she needed when she scheduled pt.

## 2016-10-20 NOTE — Telephone Encounter (Signed)
Megan at [facility] called to say that the patient was being seen there today and has a EGD/TCS scheduled for next Thursday. Patient is telling them that Dr Gerarda Fraction needed to fill out papers we sent her home with. Jinny Blossom is unsure if this was clearance from Dr Gerarda Fraction prior to procedure. I told her all the nurses were busy at the moment and I would have to find out from them. She said to call her direct number at [phone] ext [phone]. She was going to lunch at 12, but to [facility] if we didn't get her.

## 2016-10-26 DIAGNOSIS — D649 Anemia, unspecified: Secondary | ICD-10-CM | POA: Diagnosis not present

## 2016-10-28 DIAGNOSIS — Z803 Family history of malignant neoplasm of breast: Secondary | ICD-10-CM | POA: Diagnosis not present

## 2016-10-28 DIAGNOSIS — N6001 Solitary cyst of right breast: Secondary | ICD-10-CM | POA: Diagnosis not present

## 2016-10-28 DIAGNOSIS — R921 Mammographic calcification found on diagnostic imaging of breast: Secondary | ICD-10-CM | POA: Diagnosis not present

## 2016-10-28 DIAGNOSIS — N6489 Other specified disorders of breast: Secondary | ICD-10-CM | POA: Diagnosis not present

## 2016-10-29 ENCOUNTER — Encounter (HOSPITAL_COMMUNITY): Payer: Self-pay | Admitting: *Deleted

## 2016-10-29 ENCOUNTER — Ambulatory Visit (HOSPITAL_COMMUNITY)
Admission: RE | Admit: 2016-10-29 | Discharge: 2016-10-29 | Disposition: A | Discharge status: Home Health | Payer: Medicare Other | Source: Ambulatory Visit | Attending: Internal Medicine | Admitting: Internal Medicine

## 2016-10-29 ENCOUNTER — Encounter (HOSPITAL_COMMUNITY)
Admission: RE | Disposition: A | Discharge status: Home Health | Payer: Self-pay | Source: Ambulatory Visit | Attending: Internal Medicine

## 2016-10-29 DIAGNOSIS — E039 Hypothyroidism, unspecified: Secondary | ICD-10-CM | POA: Insufficient documentation

## 2016-10-29 DIAGNOSIS — K3189 Other diseases of stomach and duodenum: Secondary | ICD-10-CM | POA: Diagnosis not present

## 2016-10-29 DIAGNOSIS — K259 Gastric ulcer, unspecified as acute or chronic, without hemorrhage or perforation: Secondary | ICD-10-CM | POA: Diagnosis not present

## 2016-10-29 DIAGNOSIS — H409 Unspecified glaucoma: Secondary | ICD-10-CM | POA: Diagnosis not present

## 2016-10-29 DIAGNOSIS — R159 Full incontinence of feces: Secondary | ICD-10-CM | POA: Diagnosis not present

## 2016-10-29 DIAGNOSIS — E785 Hyperlipidemia, unspecified: Secondary | ICD-10-CM | POA: Insufficient documentation

## 2016-10-29 DIAGNOSIS — Z800 Family history of malignant neoplasm of digestive organs: Secondary | ICD-10-CM | POA: Insufficient documentation

## 2016-10-29 DIAGNOSIS — K5730 Diverticulosis of large intestine without perforation or abscess without bleeding: Secondary | ICD-10-CM

## 2016-10-29 DIAGNOSIS — Z85820 Personal history of malignant melanoma of skin: Secondary | ICD-10-CM | POA: Insufficient documentation

## 2016-10-29 DIAGNOSIS — Z8249 Family history of ischemic heart disease and other diseases of the circulatory system: Secondary | ICD-10-CM | POA: Diagnosis not present

## 2016-10-29 DIAGNOSIS — Z90710 Acquired absence of both cervix and uterus: Secondary | ICD-10-CM | POA: Insufficient documentation

## 2016-10-29 DIAGNOSIS — M069 Rheumatoid arthritis, unspecified: Secondary | ICD-10-CM | POA: Insufficient documentation

## 2016-10-29 DIAGNOSIS — Z7984 Long term (current) use of oral hypoglycemic drugs: Secondary | ICD-10-CM | POA: Insufficient documentation

## 2016-10-29 DIAGNOSIS — R194 Change in bowel habit: Secondary | ICD-10-CM

## 2016-10-29 DIAGNOSIS — Z8711 Personal history of peptic ulcer disease: Secondary | ICD-10-CM | POA: Insufficient documentation

## 2016-10-29 DIAGNOSIS — Z803 Family history of malignant neoplasm of breast: Secondary | ICD-10-CM | POA: Diagnosis not present

## 2016-10-29 DIAGNOSIS — Z7982 Long term (current) use of aspirin: Secondary | ICD-10-CM | POA: Insufficient documentation

## 2016-10-29 DIAGNOSIS — E119 Type 2 diabetes mellitus without complications: Secondary | ICD-10-CM | POA: Insufficient documentation

## 2016-10-29 DIAGNOSIS — Z86010 Personal history of colonic polyps: Secondary | ICD-10-CM

## 2016-10-29 DIAGNOSIS — R1310 Dysphagia, unspecified: Secondary | ICD-10-CM

## 2016-10-29 DIAGNOSIS — Z833 Family history of diabetes mellitus: Secondary | ICD-10-CM | POA: Diagnosis not present

## 2016-10-29 DIAGNOSIS — I10 Essential (primary) hypertension: Secondary | ICD-10-CM | POA: Diagnosis not present

## 2016-10-29 HISTORY — PX: MALONEY DILATION: SHX5535

## 2016-10-29 HISTORY — PX: ESOPHAGOGASTRODUODENOSCOPY: SHX5428

## 2016-10-29 HISTORY — PX: COLONOSCOPY: SHX5424

## 2016-10-29 LAB — GLUCOSE, CAPILLARY: GLUCOSE-CAPILLARY: 168 mg/dL — AB (ref 65–99)

## 2016-10-29 SURGERY — COLONOSCOPY
Anesthesia: Moderate Sedation

## 2016-10-29 MED ORDER — ONDANSETRON HCL 4 MG/2ML IJ SOLN
INTRAMUSCULAR | Status: DC | PRN
  Administered 2016-10-29: 4 mg via INTRAVENOUS

## 2016-10-29 MED ORDER — PROMETHAZINE HCL 25 MG/ML IJ SOLN
12.5000 mg | Freq: Once | INTRAMUSCULAR | Status: AC
  Administered 2016-10-29: 12.5 mg via INTRAVENOUS

## 2016-10-29 MED ORDER — SODIUM CHLORIDE 0.9% FLUSH
INTRAVENOUS | Status: AC
  Filled 2016-10-29: qty 10

## 2016-10-29 MED ORDER — MEPERIDINE HCL 100 MG/ML IJ SOLN
INTRAMUSCULAR | Status: AC
  Filled 2016-10-29: qty 2

## 2016-10-29 MED ORDER — MEPERIDINE HCL 100 MG/ML IJ SOLN
INTRAMUSCULAR | Status: DC | PRN
  Administered 2016-10-29: 50 mg via INTRAVENOUS

## 2016-10-29 MED ORDER — PROMETHAZINE HCL 25 MG/ML IJ SOLN
INTRAMUSCULAR | Status: AC
  Filled 2016-10-29: qty 1

## 2016-10-29 MED ORDER — LIDOCAINE VISCOUS 2 % MT SOLN
OROMUCOSAL | Status: DC | PRN
  Administered 2016-10-29: 1 via OROMUCOSAL

## 2016-10-29 MED ORDER — MIDAZOLAM HCL 5 MG/5ML IJ SOLN
INTRAMUSCULAR | Status: AC
  Filled 2016-10-29: qty 10

## 2016-10-29 MED ORDER — ONDANSETRON HCL 4 MG/2ML IJ SOLN
INTRAMUSCULAR | Status: AC
  Filled 2016-10-29: qty 2

## 2016-10-29 MED ORDER — SODIUM CHLORIDE 0.9 % IV SOLN
INTRAVENOUS | Status: DC
  Administered 2016-10-29: 13:00:00 via INTRAVENOUS

## 2016-10-29 MED ORDER — LIDOCAINE VISCOUS 2 % MT SOLN
OROMUCOSAL | Status: AC
  Filled 2016-10-29: qty 15

## 2016-10-29 MED ORDER — MIDAZOLAM HCL 5 MG/5ML IJ SOLN
INTRAMUSCULAR | Status: DC | PRN
  Administered 2016-10-29: 2 mg via INTRAVENOUS
  Administered 2016-10-29: 1 mg via INTRAVENOUS

## 2016-10-29 NOTE — Op Note (Signed)
[facility] Patient Name: [name] [name] Procedure Date: 10/29/2016 1:29 PM MRN: [id number] Date of Birth: 11-25-1946 Attending MD: Norvel Richards , MD CSN: [id number] Age: 70 Admit Type: Outpatient Procedure:                Upper GI endoscopy Indications:              Dysphagia Providers:                Norvel Richards, MD, Janeece Riggers, RN, Sherlyn Lees, Technician Referring MD:              Medicines:                Midazolam 2 mg IV, Meperidine 50 mg IV, Ondansetron                            4 mg IV, Promethazine 03.4 mg IV Complications:            No immediate complications. Estimated Blood Loss:     Estimated blood loss was minimal. Procedure:                Pre-Anesthesia Assessment:                           - Prior to the procedure, a History and Physical                            was performed, and patient medications and                            allergies were reviewed. The patient's tolerance of                            previous anesthesia was also reviewed. The risks                            and benefits of the procedure and the sedation                            options and risks were discussed with the patient.                            All questions were answered, and informed consent                            was obtained. Prior Anticoagulants: The patient has                            taken no previous anticoagulant or antiplatelet                            agents. ASA Grade Assessment: II - A patient with  mild systemic disease. After reviewing the risks                            and benefits, the patient was deemed in                            satisfactory condition to undergo the procedure.                           After obtaining informed consent, the endoscope was                            passed under direct vision. Throughout the                            procedure, the  patient's blood pressure, pulse, and                            oxygen saturations were monitored continuously. The                            EG-[id number] ([id number]) scope was introduced through the                            mouth, and advanced to the second part of duodenum.                            The upper GI endoscopy was accomplished without                            difficulty. The patient tolerated the procedure                            well. Scope In: 1:34:44 PM Scope Out: 1:43:24 PM Total Procedure Duration: 0 hours 8 minutes 40 seconds  Findings:      The examined esophagus was normal.      One cratered gastric ulcer with no stigmata of bleeding was found in the       gastric antrum. The lesion was 4 mm in largest dimension. Biopsies were       taken with a cold forceps for histology. Estimated blood loss was       minimal.      The duodenal bulb and second portion of the duodenum were normal. The       scope was withdrawn. Dilation was performed with a Maloney dilator with       no resistance at 47 Fr. The dilation site was examined following       endoscope reinsertion and showed no change. Estimated blood loss: none. Impression:               - Normal esophagus. Dilated.                           - Gastric ulcer with no stigmata of bleeding.  Biopsied.                           - Normal duodenal bulb and second portion of the                            duodenum. Moderate Sedation:      Moderate (conscious) sedation was administered by the endoscopy nurse       and supervised by the endoscopist. The following parameters were       monitored: oxygen saturation, heart rate, blood pressure, respiratory       rate, EKG, adequacy of pulmonary ventilation, and response to care.       Total physician intraservice time was 15 minutes. Recommendation:           - Patient has a contact number available for                            emergencies. The  signs and symptoms of potential                            delayed complications were discussed with the                            patient. Return to normal activities tomorrow.                            Written discharge instructions were provided to the                            patient.                           - Resume previous diet.                           - Continue present medications. Avoid nonsteroidal                            agents. Stop Tagamet; begin Protonix 40 mg daily                           - Await pathology results.                           - Repeat upper endoscopy after studies are complete                            for surveillance based on pathology results. Procedure Code(s):        --- Professional ---                           [phone], Esophagogastroduodenoscopy, flexible,                            transoral; with biopsy, single or multiple  51884, Moderate sedation services provided by the                            same physician or other qualified health care                            professional performing the diagnostic or                            therapeutic service that the sedation supports,                            requiring the presence of an independent trained                            observer to assist in the monitoring of the                            patient's level of consciousness and physiological                            status; initial 15 minutes of intraservice time,                            patient age 106 years or older Diagnosis Code(s):        --- Professional ---                           K25.9, Gastric ulcer, unspecified as acute or                            chronic, without hemorrhage or perforation                           R13.10, Dysphagia, unspecified CPT copyright 2016 American Medical Association. All rights reserved. The codes documented in this report are preliminary and upon coder  review may  be revised to meet current compliance requirements. Cristopher Estimable. Jodean Valade, MD Norvel Richards, MD 10/29/2016 2:10:15 PM This report has been signed electronically. Number of Addenda: 0

## 2016-10-29 NOTE — Discharge Instructions (Addendum)
Colonoscopy Discharge Instructions  Read the instructions outlined below and refer to this sheet in the next few weeks. These discharge instructions provide you with general information on caring for yourself after you leave the hospital. Your doctor may also give you specific instructions. While your treatment has been planned according to the most current medical practices available, unavoidable complications occasionally occur. If you have any problems or questions after discharge, call Dr. Gala Romney at [phone]. ACTIVITY  You may resume your regular activity, but move at a slower pace for the next 24 hours.   Take frequent rest periods for the next 24 hours.   Walking will help get rid of the air and reduce the bloated feeling in your belly (abdomen).   No driving for 24 hours (because of the medicine (anesthesia) used during the test).    Do not sign any important legal documents or operate any machinery for 24 hours (because of the anesthesia used during the test).  NUTRITION  Drink plenty of fluids.   You may resume your normal diet as instructed by your doctor.   Begin with a light meal and progress to your normal diet. Heavy or fried foods are harder to digest and may make you feel sick to your stomach (nauseated).   Avoid alcoholic beverages for 24 hours or as instructed.  MEDICATIONS  You may resume your normal medications unless your doctor tells you otherwise.  WHAT YOU CAN EXPECT TODAY  Some feelings of bloating in the abdomen.   Passage of more gas than usual.   Spotting of blood in your stool or on the toilet paper.  IF YOU HAD POLYPS REMOVED DURING THE COLONOSCOPY:  No aspirin products for 7 days or as instructed.   No alcohol for 7 days or as instructed.   Eat a soft diet for the next 24 hours.  FINDING OUT THE RESULTS OF YOUR TEST Not all test results are available during your visit. If your test results are not back during the visit, make an appointment  with your caregiver to find out the results. Do not assume everything is normal if you have not heard from your caregiver or the medical facility. It is important for you to follow up on all of your test results.  SEEK IMMEDIATE MEDICAL ATTENTION IF:  You have more than a spotting of blood in your stool.   Your belly is swollen (abdominal distention).   You are nauseated or vomiting.   You have a temperature over 101.   You have abdominal pain or discomfort that is severe or gets worse throughout the day.   EGD Discharge instructions Please read the instructions outlined below and refer to this sheet in the next few weeks. These discharge instructions provide you with general information on caring for yourself after you leave the hospital. Your doctor may also give you specific instructions. While your treatment has been planned according to the most current medical practices available, unavoidable complications occasionally occur. If you have any problems or questions after discharge, please call your doctor. ACTIVITY  You may resume your regular activity but move at a slower pace for the next 24 hours.   Take frequent rest periods for the next 24 hours.   Walking will help expel (get rid of) the air and reduce the bloated feeling in your abdomen.   No driving for 24 hours (because of the anesthesia (medicine) used during the test).   You may shower.   Do not sign any  important legal documents or operate any machinery for 24 hours (because of the anesthesia used during the test).  NUTRITION  Drink plenty of fluids.   You may resume your normal diet.   Begin with a light meal and progress to your normal diet.   Avoid alcoholic beverages for 24 hours or as instructed by your caregiver.  MEDICATIONS  You may resume your normal medications unless your caregiver tells you otherwise.  WHAT YOU CAN EXPECT TODAY  You may experience abdominal discomfort such as a feeling of  fullness or gas pains.  FOLLOW-UP  Your doctor will discuss the results of your test with you.  SEEK IMMEDIATE MEDICAL ATTENTION IF ANY OF THE FOLLOWING OCCUR:  Excessive nausea (feeling sick to your stomach) and/or vomiting.   Severe abdominal pain and distention (swelling).   Trouble swallowing.   Temperature over 101 F (37.8 C).   Rectal bleeding or vomiting of blood.     Colonic diverticulosis information provided  Peptic ulcer information provided  Stop Tagamet; begin Protonix 40 mg once daily.  Further recommendations to follow pending review of pathology report  Office visit with [place] in 3 months    January 29, 2017 @ 9:30 AM with Anselmo Rod  Diverticulosis Diverticulosis is the condition that develops when small pouches (diverticula) form in the wall of your colon. Your colon, or large intestine, is where water is absorbed and stool is formed. The pouches form when the inside layer of your colon pushes through weak spots in the outer layers of your colon. CAUSES  No one knows exactly what causes diverticulosis. RISK FACTORS  Being older than 109. Your risk for this condition increases with age. Diverticulosis is rare in people younger than 40 years. By age 74, almost everyone has it.  Eating a low-fiber diet.  Being frequently constipated.  Being overweight.  Not getting enough exercise.  Smoking.  Taking over-the-counter pain medicines, like aspirin and ibuprofen. SYMPTOMS  Most people with diverticulosis do not have symptoms. DIAGNOSIS  Because diverticulosis often has no symptoms, health care providers often discover the condition during an exam for other colon problems. In many cases, a health care provider will diagnose diverticulosis while using a flexible scope to examine the colon (colonoscopy). TREATMENT  If you have never developed an infection related to diverticulosis, you may not need treatment. If you have had an infection before,  treatment may include:  Eating more fruits, vegetables, and grains.  Taking a fiber supplement.  Taking a live bacteria supplement (probiotic).  Taking medicine to relax your colon. HOME CARE INSTRUCTIONS   Drink at least 6-8 glasses of water each day to prevent constipation.  Try not to strain when you have a bowel movement.  Keep all follow-up appointments. If you have had an infection before:  Increase the fiber in your diet as directed by your health care provider or dietitian.  Take a dietary fiber supplement if your health care provider approves.  Only take medicines as directed by your health care provider. SEEK MEDICAL CARE IF:   You have abdominal pain.  You have bloating.  You have cramps.  You have not gone to the bathroom in 3 days. SEEK IMMEDIATE MEDICAL CARE IF:   Your pain gets worse.  Yourbloating becomes very bad.  You have a fever or chills, and your symptoms suddenly get worse.  You begin vomiting.  You have bowel movements that are bloody or black. MAKE SURE YOU:  Understand these instructions.  Will  watch your condition.  Will get help right away if you are not doing well or get worse. This information is not intended to replace advice given to you by your health care provider. Make sure you discuss any questions you have with your health care provider.   Peptic Ulcer A peptic ulcer is a painful sore in the lining of your esophagus, stomach, or in the first part of your small intestine. The main causes of an ulcer can be:  An infection.  Using certain pain medicines too often or too much.  Smoking. HOME CARE  Avoid smoking, alcohol, and caffeine.  Avoid foods that bother you.  Only take medicine as told by your doctor. Do not take any medicines your doctor has not approved.  Keep all doctor visits as told. GET HELP IF:  You do not get better in 7 days after starting treatment.  You keep having an upset stomach  (indigestion) or heartburn. GET HELP RIGHT AWAY IF:  You have sudden, sharp, or lasting belly (abdominal) pain.  You have bloody, black, or tarry poop (stool).  You throw up (vomit) blood or your throw up looks like coffee grounds.  You get light-headed, weak, or feel like you will pass out (faint).  You get sweaty or feel sticky and cold to the touch (clammy). MAKE SURE YOU:   Understand these instructions.  Will watch your condition.  Will get help right away if you are not doing well or get worse.

## 2016-10-29 NOTE — H&P (View-Only) (Signed)
Primary Care Physician:  Glo Herring., MD  Primary Gastroenterologist:  Garfield Cornea, MD   Chief Complaint  Patient presents with  . Dysphagia    HPI:  [name] [name] is a 70 y.o. female here with complaints of difficulty swallowing and change in bowel habits. She states that her stool caliber has changed significantly. Stools are very small and difficult to pass. After she passes the first solid stool she has multiple loose stools that day. Episodes of fecal incontinence with it. Denies melena. Rarely sees bright red blood per rectum, usually if she has difficulty passing the stool. She's had some lower abdominal discomfort. This is occasional. Usually when she pushes on her belly. Denies any heartburn. She feels like she has a fullness in the upper esophagus/throat area when she swallows. Otherwise feels like food slides down okay. She notes with swallowing liquids she coughs and strangles. Denies odynophagia. No weight loss.  Patient was last seen in October 2013. EGD showed gastric ulcer, gastric and duodenal erosions in the setting of Mobic. Colonoscopy with rectal polyps which were hyperplastic, colonic diverticulosis, poor prep. Unfortunately patient failed to follow-up for her 3 month OV at which time we would've scheduled her for EGD. Multiple no-shows since then.  Current Outpatient Prescriptions  Medication Sig Dispense Refill  . ALPRAZolam (XANAX) 0.5 MG tablet Take by mouth.    Marland Kitchen amLODipine (NORVASC) 10 MG tablet Take 10 mg by mouth daily.      Marland Kitchen aspirin EC 81 MG tablet Take by mouth.    . Cholecalciferol ([employer]S VIT D 5000 HIGH-POTENCY) 5000 UNITS capsule Take 5,000 Units by mouth daily.    . cimetidine (TAGAMET) 200 MG tablet Take 200 mg by mouth daily as needed (Reflux).     . Cinnamon 500 MG capsule Take 500 mg by mouth daily.    . enalapril (VASOTEC) 20 MG tablet Take 20 mg by mouth daily.    Marland Kitchen gabapentin (NEURONTIN) 300 MG capsule Take 600 mg by mouth 3 (three) times  daily.     Marland Kitchen glipiZIDE (GLUCOTROL) 10 MG tablet Take 10 mg by mouth daily.     . hydrochlorothiazide 25 MG tablet Take 12.5 mg by mouth daily.     Marland Kitchen HYDROcodone-acetaminophen (NORCO) 10-325 MG tablet Take 1 tablet by mouth.     . latanoprost (XALATAN) 0.005 % ophthalmic solution 1 drop at bedtime.    Marland Kitchen levothyroxine (SYNTHROID, LEVOTHROID) 100 MCG tablet Take 100 mcg by mouth daily.     . metFORMIN (GLUCOPHAGE) 1000 MG tablet Take 1,000 mg by mouth 2 (two) times daily with a meal.    . metFORMIN (GLUCOPHAGE) 500 MG tablet Take 500 mg by mouth daily.    . Omega-3 Fatty Acids (FISH OIL) 1000 MG CAPS Take by mouth.    . potassium chloride SA (K-DUR,KLOR-CON) 20 MEQ tablet Take 20 mEq by mouth daily.     . Prenatal Vit-Fe Fumarate-FA (PRENATAL MULTIVITAMIN) TABS Take 1 tablet by mouth daily.    . simvastatin (ZOCOR) 20 MG tablet Take 20 mg by mouth at bedtime.      . timolol (TIMOPTIC) 0.5 % ophthalmic solution 1 drop 2 (two) times daily.    Marland Kitchen UNABLE TO FIND Med Name: occuvite vitamin    . vitamin C (ASCORBIC ACID) 500 MG tablet Take 500 mg by mouth daily.    . vitamin E 400 UNIT capsule Take 400 Units by mouth daily.     No current facility-administered medications for this visit.  Allergies as of 10/16/2016  . (No Known Allergies)    Past Medical History:  Diagnosis Date  . Aortic stenosis   . Arthritis   . Blood transfusion without reported diagnosis   . Bronchitis   . Diabetes mellitus   . Diverticulitis   . Glaucoma   . Heart murmur   . Hyperlipidemia   . Hypertension   . Hypothyroidism   . Melanoma ([place])    left thigh, 2010  . Osteoarthritis   . Peptic ulcer   . Rheumatoid arthritis(714.0)   . Thyroid disease     Past Surgical History:  Procedure Laterality Date  . ABDOMINAL HYSTERECTOMY  1988  . APPENDECTOMY    . BACK SURGERY  07/2008   fusion  . BACK SURGERY  0/1749   complicated by post-op anemia requiring transfusion  . BREAST SURGERY     right breast  biopsy  . CHOLECYSTECTOMY  1974  . COLONOSCOPY  06/06/2007   Proximal diminutive rectal polyps cold biopsied/removed, otherwise normal rectum/ Left-sided diverticula, the remainder of the colonic mucosa appeared normal. Path-tubular adnemoa.   . COLONOSCOPY  09/14/2012   [id number]:[id number] polyps-removed as described  above Colonic diverticulosis. Poor prep. next TCS five years.   . ESOPHAGOGASTRODUODENOSCOPY  09/2012   Rourk: gastric ulcer, gastric duodenal erosions, small hh. benign bx  . EYE SURGERY    . FOOT SURGERY  1994   left   . LUMBAR SPINE SURGERY  08/04/2014   dr Joya Salm  . macular degen    . MELANOMA EXCISION  2010    Family History  Problem Relation Age of Onset  . Colon cancer Brother     diagnosed at age 85  . Diabetes Brother   . Hypertension Mother   . Heart failure Mother   . Diabetes Mother   . Hypertension Other   . Other Other   . Heart failure Father     passed away of MI in his 41s  . Diabetes Sister   . Hypertension Sister   . Breast cancer Sister   . Heart attack Brother   . Diabetes Sister   . Hypertension Sister   . Other Sister     tumor  . Hypertension Sister   . Hypertension Daughter   . Bipolar disorder Daughter   . Anesthesia problems Neg Hx     Social History   Social History  . Marital status: Widowed    Spouse name: N/A  . Number of children: N/A  . Years of education: N/A   Occupational History  . Not on file.   Social History Main Topics  . Smoking status: Never Smoker  . Smokeless tobacco: Never Used  . Alcohol use No     Comment: "Wine a couple times a month"  . Drug use: No  . Sexual activity: Yes    Birth control/ protection: Surgical   Other Topics Concern  . Not on file   Social History Narrative   Husband deceased 14-Apr-2011.       ROS:  General: Negative for anorexia, weight loss, fever, chills, fatigue, weakness. Eyes: Negative for vision changes.  ENT: Negative for hoarseness, difficulty swallowing , nasal  congestion. CV: Negative for chest pain, angina, palpitations, dyspnea on exertion, peripheral edema.  Respiratory: Negative for dyspnea at rest, dyspnea on exertion, cough, sputum, wheezing.  GI: See history of present illness. GU:  Negative for dysuria, hematuria, urinary incontinence, urinary frequency, nocturnal urination.  MS: Positive  joint pain, low back pain.  Derm: Negative for rash or itching.  Neuro: Negative for weakness, abnormal sensation, seizure, frequent headaches, memory loss, confusion.  Psych: Negative for anxiety, depression, suicidal ideation, hallucinations.  Endo: Negative for unusual weight change.  Heme: Negative for bruising or bleeding. Allergy: Negative for rash or hives.    Physical Examination:  BP (!) 180/85   Pulse 75   Temp 97.3 F (36.3 C) (Oral)   Ht 4\' 9"  (1.448 m)   Wt 138 lb 3.2 oz (62.7 kg)   BMI 29.91 kg/m  repeat BP 144/88   General: Well-nourished, well-developed in no acute distress.  Head: Normocephalic, atraumatic.   Eyes: Conjunctiva pink, no icterus. Mouth: Oropharyngeal mucosa moist and pink , no lesions erythema or exudate. Neck: Supple without thyromegaly, masses, or lymphadenopathy.  Lungs: Clear to auscultation bilaterally.  Heart: Regular rate and rhythm, no rubs or gallops. 2/6 systolic ejection murmur Abdomen: Bowel sounds are normal, nontender, nondistended, no hepatosplenomegaly or masses, no abdominal bruits or    hernia , no rebound or guarding.   Rectal: Deferred Extremities: No lower extremity edema. No clubbing or deformities.  Neuro: Alert and oriented x 4 , grossly normal neurologically.  Skin: Warm and dry, no rash or jaundice.   Psych: Alert and cooperative, normal mood and affect.  Labs: January 2017, TSH 2.8. August 2017 hemoglobin A1c 7.3.  Imaging Studies: No results found.

## 2016-10-29 NOTE — Interval H&P Note (Signed)
History and Physical Interval Note:  10/29/2016 1:27 PM  [name] [name]  has presented today for surgery, with the diagnosis of change bowels, dysphagia, family hx CRC, personal hx colon polyps  The various methods of treatment have been discussed with the patient and family. After consideration of risks, benefits and other options for treatment, the patient has consented to  Procedure(s) with comments: COLONOSCOPY (N/A) - 2:00 pm ESOPHAGOGASTRODUODENOSCOPY (EGD) (N/A) MALONEY DILATION (N/A) as a surgical intervention .  The patient's history has been reviewed, patient examined, no change in status, stable for surgery.  I have reviewed the patient's chart and labs.  Questions were answered to the patient's satisfaction.     [name] [name]  No change. EGD with ED and colonoscopy per plan. Review outside records indicate borderline anemia with a hemoglobin of 11 MCV 81.  The risks, benefits, limitations, imponderables and alternatives regarding both EGD and colonoscopy have been reviewed with the patient. Questions have been answered. All parties agreeable.

## 2016-11-02 ENCOUNTER — Encounter (HOSPITAL_COMMUNITY): Payer: Self-pay | Admitting: Internal Medicine

## 2016-11-04 ENCOUNTER — Encounter: Payer: Self-pay | Admitting: Internal Medicine

## 2016-11-04 NOTE — Op Note (Signed)
[facility] Patient Name: [name] [name] Procedure Date: 10/29/2016 1:46 PM MRN: [id number] Date of Birth: 1946/06/07 Attending MD: Norvel Richards , MD CSN: [id number] Age: 70 Admit Type: Outpatient Procedure:                Colonoscopy Indications:              Change in bowel habits Providers:                Norvel Richards, MD, Janeece Riggers, RN, [facility], Technician Referring MD:              Medicines:                Midazolam 4 mg IV, Meperidine 50 mg IV, Ondansetron                            4 mg IV Complications:            No immediate complications. Estimated Blood Loss:     Estimated blood loss: none. Procedure:                Pre-Anesthesia Assessment:                           - Prior to the procedure, a History and Physical                            was performed, and patient medications and                            allergies were reviewed. The patient's tolerance of                            previous anesthesia was also reviewed. The risks                            and benefits of the procedure and the sedation                            options and risks were discussed with the patient.                            All questions were answered, and informed consent                            was obtained. Prior Anticoagulants: The patient has                            taken no previous anticoagulant or antiplatelet                            agents. ASA Grade Assessment: II - A patient with  mild systemic disease. After reviewing the risks                            and benefits, the patient was deemed in                            satisfactory condition to undergo the procedure.                           - Prior to the procedure, a History and Physical                            was performed, and patient medications and                            allergies were reviewed. The patient's  tolerance of                            previous anesthesia was also reviewed. The risks                            and benefits of the procedure and the sedation                            options and risks were discussed with the patient.                            All questions were answered, and informed consent                            was obtained. Prior Anticoagulants: The patient has                            taken no previous anticoagulant or antiplatelet                            agents. ASA Grade Assessment: II - A patient with                            mild systemic disease. After reviewing the risks                            and benefits, the patient was deemed in                            satisfactory condition to undergo the procedure.                           After obtaining informed consent, the colonoscope                            was passed under direct vision. Throughout the  procedure, the patient's blood pressure, pulse, and                            oxygen saturations were monitored continuously. The                            EC38-i10L [id number]) scope was introduced through                            the anus and advanced to the the cecum, identified                            by appendiceal orifice and ileocecal valve. The                            colonoscopy was performed without difficulty. The                            patient tolerated the procedure well. The quality                            of the bowel preparation was adequate. The                            ileocecal valve, appendiceal orifice, and rectum                            were photographed. The ileocecal valve, appendiceal                            orifice, and rectum were photographed. The                            ileocecal valve, appendiceal orifice, and rectum                            were photographed. The quality of the bowel                             preparation was adequate. The entire colon was well                            visualized. Scope In: 1:49:45 PM Scope Out: 2:03:29 PM Scope Withdrawal Time: 0 hours 8 minutes 2 seconds  Total Procedure Duration: 0 hours 13 minutes 44 seconds  Findings:      The perianal and digital rectal examinations were normal.      Scattered small-mouthed diverticula were found in the left colon.      No additional abnormalities were found on retroflexion.      The exam was otherwise without abnormality on direct and retroflexion       views. Impression:               - Diverticulosis in the left colon.                           -  The examination was otherwise normal on direct                            and retroflexion views.                           - No specimens collected. Moderate Sedation:      Moderate (conscious) sedation was administered by the endoscopy nurse       and supervised by the endoscopist. The following parameters were       monitored: oxygen saturation, heart rate, blood pressure, respiratory       rate, EKG, adequacy of pulmonary ventilation, and response to care.       Total physician intraservice time was 20 minutes. Recommendation:           - Patient has a contact number available for                            emergencies. The signs and symptoms of potential                            delayed complications were discussed with the                            patient. Return to normal activities tomorrow.                            Written discharge instructions were provided to the                            patient.                           - Resume previous diet.                           - Continue present medications. See EGD report                           - Return to GI office in 3 months.                           - Repeat colonoscopy in 5 years for surveillance.                           - Return to my office (date not yet determined). Procedure Code(s):         --- Professional ---                           [phone], Colonoscopy, flexible; diagnostic, including                            collection of specimen(s) by brushing or washing,                            when performed (separate procedure)  83662, Moderate sedation services provided by the                            same physician or other qualified health care                            professional performing the diagnostic or                            therapeutic service that the sedation supports,                            requiring the presence of an independent trained                            observer to assist in the monitoring of the                            patient's level of consciousness and physiological                            status; initial 15 minutes of intraservice time,                            patient age 90 years or older Diagnosis Code(s):        --- Professional ---                           R19.4, Change in bowel habit                           K57.30, Diverticulosis of large intestine without                            perforation or abscess without bleeding CPT copyright 2016 American [employer] Association. All rights reserved. The codes documented in this report are preliminary and upon coder review may  be revised to meet current compliance requirements. Cristopher Estimable. Branko Steeves, MD Norvel Richards, MD 11/04/2016 11:29:40 AM This report has been signed electronically. Number of Addenda: 0

## 2016-11-20 ENCOUNTER — Ambulatory Visit: Payer: Medicare Other | Admitting: Cardiovascular Disease

## 2016-12-16 ENCOUNTER — Ambulatory Visit: Payer: Medicare Other | Admitting: Cardiovascular Disease

## 2017-01-07 ENCOUNTER — Ambulatory Visit (INDEPENDENT_AMBULATORY_CARE_PROVIDER_SITE_OTHER): Payer: Medicare HMO | Admitting: Cardiovascular Disease

## 2017-01-07 ENCOUNTER — Encounter: Payer: Self-pay | Admitting: Cardiovascular Disease

## 2017-01-07 VITALS — BP 154/70 | HR 72 | Ht <= 58 in | Wt 135.4 lb

## 2017-01-07 DIAGNOSIS — E7800 Pure hypercholesterolemia, unspecified: Secondary | ICD-10-CM | POA: Diagnosis not present

## 2017-01-07 DIAGNOSIS — I350 Nonrheumatic aortic (valve) stenosis: Secondary | ICD-10-CM | POA: Diagnosis not present

## 2017-01-07 DIAGNOSIS — I10 Essential (primary) hypertension: Secondary | ICD-10-CM

## 2017-01-07 MED ORDER — ENALAPRIL MALEATE 20 MG PO TABS: 20.0000 mg | ORAL_TABLET | Freq: Two times a day (BID) | ORAL | 6 refills | Status: DC

## 2017-01-07 NOTE — Progress Notes (Signed)
SUBJECTIVE: The patient is a 71 year old woman was last seen by Dr. Gwenlyn Found on 08/02/14. At that time she was evaluated for preoperative clearance prior to undergoing back surgery. She has a history of hypertension, hyperlipidemia, diabetes, and mild aortic stenosis.  Echocardiogram 08/02/14: Normal left ventricular systolic function, LVEF 9323%, mild LVH, grade 1 diastolic dysfunction, very mild aortic stenosis, and mild aortic regurgitation.  She denies exertional chest pain. She very seldom has mild exertional dyspnea. She denies syncope. She says she gets short of breath by the end of finishing a sentence. She has had 3 back surgeries. She has occasional lightheadedness. She fell recently but said she tripped. She has some residual right shoulder pain.  ECG performed in the office today which I personally reviewed demonstrates normal sinus rhythm with no ischemic ST segment or T-wave abnormalities, nor any arrhythmias.     Review of Systems: As per "subjective", otherwise negative.  No Known Allergies  Current Outpatient Prescriptions  Medication Sig Dispense Refill  . ALPRAZolam (XANAX) 0.5 MG tablet Take 0.5 mg by mouth at bedtime as needed for sleep.     Marland Kitchen amLODipine (NORVASC) 10 MG tablet Take 10 mg by mouth daily.      Marland Kitchen aspirin EC 81 MG tablet Take by mouth.    . Cholecalciferol (CVS VIT D 5000 HIGH-POTENCY) 5000 UNITS capsule Take 5,000 Units by mouth daily.    . cimetidine (TAGAMET) 200 MG tablet Take 200 mg by mouth daily as needed (Reflux).     . Cinnamon 500 MG capsule Take 500 mg by mouth daily.    . enalapril (VASOTEC) 20 MG tablet Take 20 mg by mouth daily.    . ferrous sulfate 325 (65 FE) MG tablet Take 325 mg by mouth daily with breakfast.    . gabapentin (NEURONTIN) 300 MG capsule Take 600 mg by mouth 3 (three) times daily.     Marland Kitchen glipiZIDE (GLUCOTROL) 10 MG tablet Take 10 mg by mouth daily.     . hydrochlorothiazide 25 MG tablet Take 12.5 mg by mouth daily.       Marland Kitchen HYDROcodone-acetaminophen (NORCO) 10-325 MG tablet Take 1 tablet by mouth every 8 (eight) hours as needed for moderate pain.     Marland Kitchen latanoprost (XALATAN) 0.005 % ophthalmic solution 1 drop at bedtime.    Marland Kitchen levothyroxine (SYNTHROID, LEVOTHROID) 88 MCG tablet Take 88 mcg by mouth daily before breakfast.    . metFORMIN (GLUCOPHAGE) 1000 MG tablet Take 1,000 mg by mouth 2 (two) times daily with a meal. Take 1,000 mg tablet twice daily (at noon and at 11 pm) along with 500 mg tablet in the morning.    . metFORMIN (GLUCOPHAGE) 500 MG tablet Take 500 mg by mouth daily with breakfast. Take 500 mg tablet in the morning along with 1000 mg tablet twice daily.    . Omega-3 Fatty Acids (FISH OIL) 1000 MG CAPS Take 1 capsule by mouth daily.     . potassium chloride SA (K-DUR,KLOR-CON) 20 MEQ tablet Take 20 mEq by mouth daily.     . Prenatal Vit-Fe Fumarate-FA (PRENATAL MULTIVITAMIN) TABS Take 1 tablet by mouth daily.    . simvastatin (ZOCOR) 20 MG tablet Take 20 mg by mouth at bedtime.      . timolol (TIMOPTIC) 0.5 % ophthalmic solution 1 drop 2 (two) times daily.    Marland Kitchen UNABLE TO FIND Med Name: occuvite vitamin    . vitamin C (ASCORBIC ACID) 500 MG tablet Take 500 mg  by mouth daily.    . vitamin E 400 UNIT capsule Take 400 Units by mouth daily.     No current facility-administered medications for this visit.     Past Medical History:  Diagnosis Date  . Aortic stenosis   . Arthritis   . Blood transfusion without reported diagnosis   . Bronchitis   . Diabetes mellitus   . Diverticulitis   . Glaucoma   . Heart murmur   . Hyperlipidemia   . Hypertension   . Hypothyroidism   . Melanoma ([place])    left thigh, 2010  . Osteoarthritis   . Peptic ulcer   . Rheumatoid arthritis(714.0)   . Thyroid disease     Past Surgical History:  Procedure Laterality Date  . ABDOMINAL HYSTERECTOMY  1988  . APPENDECTOMY    . BACK SURGERY  07/2008   fusion  . BACK SURGERY  02/1593   complicated by post-op anemia  requiring transfusion  . BREAST SURGERY     right breast biopsy  . CHOLECYSTECTOMY  1974  . COLONOSCOPY  06/06/2007   Proximal diminutive rectal polyps cold biopsied/removed, otherwise normal rectum/ Left-sided diverticula, the remainder of the colonic mucosa appeared normal. Path-tubular adnemoa.   . COLONOSCOPY  09/14/2012   [id number]:[id number] polyps-removed as described  above Colonic diverticulosis. Poor prep. next TCS five years.   . COLONOSCOPY N/A 10/29/2016   Procedure: COLONOSCOPY;  Surgeon: Daneil Dolin, MD;  Location: [facility];  Service: Endoscopy;  Laterality: N/A;  2:00 pm  . ESOPHAGOGASTRODUODENOSCOPY  09/2012   Rourk: gastric ulcer, gastric duodenal erosions, small hh. benign bx  . ESOPHAGOGASTRODUODENOSCOPY N/A 10/29/2016   Procedure: ESOPHAGOGASTRODUODENOSCOPY (EGD);  Surgeon: Daneil Dolin, MD;  Location: [facility];  Service: Endoscopy;  Laterality: N/A;  . EYE SURGERY    . FOOT SURGERY  1994   left   . LUMBAR SPINE SURGERY  08/04/2014   dr Joya Salm  . macular degen    . MALONEY DILATION N/A 10/29/2016   Procedure: Venia Minks DILATION;  Surgeon: Daneil Dolin, MD;  Location: [facility];  Service: Endoscopy;  Laterality: N/A;  . MELANOMA EXCISION  2010    Social History   Social History  . Marital status: Widowed    Spouse name: N/A  . Number of children: N/A  . Years of education: N/A   Occupational History  . Not on file.   Social History Main Topics  . Smoking status: Never Smoker  . Smokeless tobacco: Never Used  . Alcohol use No     Comment: "Wine a couple times a month"  . Drug use: No  . Sexual activity: Yes    Birth control/ protection: Surgical   Other Topics Concern  . Not on file   Social History Narrative   Husband deceased 2011-04-14.      Vitals:   01/07/17 1335  BP: (!) 154/70  Pulse: 72  SpO2: 99%  Weight: 135 lb 6.4 oz (61.4 kg)  Height: 4\' 9"  (1.448 m)    PHYSICAL EXAM General: NAD HEENT: Normal. Neck: No JVD, no  thyromegaly. Lungs: Clear to auscultation bilaterally with normal respiratory effort. CV: Nondisplaced PMI.  Regular rate and rhythm, normal S1/S2, no S3/S4, harsh 3/6 ejection systolic murmur loudest over RUSB with radiation to carotids. No pretibial or periankle edema.  No carotid bruit.   Abdomen: Soft, nontender, no distention.  Neurologic: Alert and oriented.  Psych: Normal affect. Skin: Normal. Musculoskeletal: No gross deformities.    ECG:  Most recent ECG reviewed.      ASSESSMENT AND PLAN: 1. Aortic stenosis: By auscultation it appears to be more than mild. Will repeat echocardiogram. Mild in 07/2014.  2. Hypertension: Elevated on amlodipine 10 mg daily, enalapril 20 mg daily, and hydrochlorothiazide 12.5 mg daily. Will increase enalapril to 20 mg twice daily.  3. Hyperlipidemia: Continue simvastatin 20 mg.  Dispo: fu 1 year.   Kate Sable, M.D., F.A.C.C.

## 2017-01-07 NOTE — Patient Instructions (Signed)
Medication Instructions:   Increase Enalapril to 20mg  twice a day.  Continue all other medications.    Labwork: none  Testing/Procedures:  Your physician has requested that you have an echocardiogram. Echocardiography is a painless test that uses sound waves to create images of your heart. It provides your doctor with information about the size and shape of your heart and how well your heart's chambers and valves are working. This procedure takes approximately one hour. There are no restrictions for this procedure.  Office will contact with results via phone or letter.    Follow-Up: Your physician wants you to follow up in:  1 year.  You will receive a reminder letter in the mail one-two months in advance.  If you don't receive a letter, please call our office to schedule the follow up appointment   Any Other Special Instructions Will Be Listed Below (If Applicable).  If you need a refill on your cardiac medications before your next appointment, please call your pharmacy.

## 2017-01-12 DIAGNOSIS — I350 Nonrheumatic aortic (valve) stenosis: Secondary | ICD-10-CM | POA: Diagnosis not present

## 2017-01-12 DIAGNOSIS — E1129 Type 2 diabetes mellitus with other diabetic kidney complication: Secondary | ICD-10-CM | POA: Diagnosis not present

## 2017-01-12 DIAGNOSIS — L84 Corns and callosities: Secondary | ICD-10-CM | POA: Diagnosis not present

## 2017-01-12 DIAGNOSIS — D509 Iron deficiency anemia, unspecified: Secondary | ICD-10-CM | POA: Diagnosis not present

## 2017-01-12 DIAGNOSIS — D649 Anemia, unspecified: Secondary | ICD-10-CM | POA: Diagnosis not present

## 2017-01-12 DIAGNOSIS — E1142 Type 2 diabetes mellitus with diabetic polyneuropathy: Secondary | ICD-10-CM | POA: Diagnosis not present

## 2017-01-12 DIAGNOSIS — E1165 Type 2 diabetes mellitus with hyperglycemia: Secondary | ICD-10-CM | POA: Diagnosis not present

## 2017-01-12 DIAGNOSIS — Z1389 Encounter for screening for other disorder: Secondary | ICD-10-CM | POA: Diagnosis not present

## 2017-01-12 DIAGNOSIS — E119 Type 2 diabetes mellitus without complications: Secondary | ICD-10-CM | POA: Diagnosis not present

## 2017-01-12 DIAGNOSIS — R201 Hypoesthesia of skin: Secondary | ICD-10-CM | POA: Diagnosis not present

## 2017-01-12 DIAGNOSIS — K5790 Diverticulosis of intestine, part unspecified, without perforation or abscess without bleeding: Secondary | ICD-10-CM | POA: Diagnosis not present

## 2017-01-12 DIAGNOSIS — Z6829 Body mass index (BMI) 29.0-29.9, adult: Secondary | ICD-10-CM | POA: Diagnosis not present

## 2017-01-12 DIAGNOSIS — E663 Overweight: Secondary | ICD-10-CM | POA: Diagnosis not present

## 2017-01-12 DIAGNOSIS — B353 Tinea pedis: Secondary | ICD-10-CM | POA: Diagnosis not present

## 2017-01-21 ENCOUNTER — Other Ambulatory Visit: Payer: Self-pay

## 2017-01-21 ENCOUNTER — Ambulatory Visit (INDEPENDENT_AMBULATORY_CARE_PROVIDER_SITE_OTHER): Payer: Medicare HMO

## 2017-01-21 DIAGNOSIS — I350 Nonrheumatic aortic (valve) stenosis: Secondary | ICD-10-CM | POA: Diagnosis not present

## 2017-01-26 ENCOUNTER — Telehealth: Payer: Self-pay | Admitting: Cardiovascular Disease

## 2017-01-26 NOTE — Telephone Encounter (Signed)
Patient called wanting results from test

## 2017-01-26 NOTE — Telephone Encounter (Signed)
Notes Recorded by Laurine Blazer, LPN on 8/97/9150 at 4:13 PM EST Patient notified. Copy to pmd. ------  Notes Recorded by Laurine Blazer, LPN on 6/43/8377 at 9:39 PM EST Number not in service. ------  Notes Recorded by Herminio Commons, MD on 01/25/2017 at 1:13 PM EST Normal pumping function with moderate to severe narrowing of aortic valve due to calcium buildup. Repeat echo in 1 yr. Keep original follow up.

## 2017-01-29 ENCOUNTER — Telehealth: Payer: Self-pay | Admitting: Gastroenterology

## 2017-01-29 ENCOUNTER — Encounter: Payer: Self-pay | Admitting: Gastroenterology

## 2017-01-29 ENCOUNTER — Ambulatory Visit: Payer: Medicare HMO | Admitting: Gastroenterology

## 2017-01-29 NOTE — Telephone Encounter (Signed)
PATIENT WAS A NO SHOW AND LETTER SENT  °

## 2017-02-26 DIAGNOSIS — R69 Illness, unspecified: Secondary | ICD-10-CM | POA: Diagnosis not present

## 2017-03-09 DIAGNOSIS — Z6829 Body mass index (BMI) 29.0-29.9, adult: Secondary | ICD-10-CM | POA: Diagnosis not present

## 2017-03-09 DIAGNOSIS — I10 Essential (primary) hypertension: Secondary | ICD-10-CM | POA: Diagnosis not present

## 2017-03-09 DIAGNOSIS — E063 Autoimmune thyroiditis: Secondary | ICD-10-CM | POA: Diagnosis not present

## 2017-03-09 DIAGNOSIS — G894 Chronic pain syndrome: Secondary | ICD-10-CM | POA: Diagnosis not present

## 2017-03-09 DIAGNOSIS — E663 Overweight: Secondary | ICD-10-CM | POA: Diagnosis not present

## 2017-03-10 DIAGNOSIS — R69 Illness, unspecified: Secondary | ICD-10-CM | POA: Diagnosis not present

## 2017-04-26 DIAGNOSIS — D485 Neoplasm of uncertain behavior of skin: Secondary | ICD-10-CM | POA: Diagnosis not present

## 2017-04-26 DIAGNOSIS — D225 Melanocytic nevi of trunk: Secondary | ICD-10-CM | POA: Diagnosis not present

## 2017-04-26 DIAGNOSIS — Z85820 Personal history of malignant melanoma of skin: Secondary | ICD-10-CM | POA: Diagnosis not present

## 2017-04-26 DIAGNOSIS — Z08 Encounter for follow-up examination after completed treatment for malignant neoplasm: Secondary | ICD-10-CM | POA: Diagnosis not present

## 2017-04-26 DIAGNOSIS — Z1283 Encounter for screening for malignant neoplasm of skin: Secondary | ICD-10-CM | POA: Diagnosis not present

## 2017-05-13 DIAGNOSIS — J069 Acute upper respiratory infection, unspecified: Secondary | ICD-10-CM | POA: Diagnosis not present

## 2017-05-13 DIAGNOSIS — H8113 Benign paroxysmal vertigo, bilateral: Secondary | ICD-10-CM | POA: Diagnosis not present

## 2017-05-13 DIAGNOSIS — R5383 Other fatigue: Secondary | ICD-10-CM | POA: Diagnosis not present

## 2017-05-13 DIAGNOSIS — Z6830 Body mass index (BMI) 30.0-30.9, adult: Secondary | ICD-10-CM | POA: Diagnosis not present

## 2017-05-13 DIAGNOSIS — K529 Noninfective gastroenteritis and colitis, unspecified: Secondary | ICD-10-CM | POA: Diagnosis not present

## 2017-05-13 DIAGNOSIS — E119 Type 2 diabetes mellitus without complications: Secondary | ICD-10-CM | POA: Diagnosis not present

## 2017-06-28 DIAGNOSIS — Z0001 Encounter for general adult medical examination with abnormal findings: Secondary | ICD-10-CM | POA: Diagnosis not present

## 2017-06-28 DIAGNOSIS — B029 Zoster without complications: Secondary | ICD-10-CM | POA: Diagnosis not present

## 2017-06-28 DIAGNOSIS — Z1389 Encounter for screening for other disorder: Secondary | ICD-10-CM | POA: Diagnosis not present

## 2017-06-28 DIAGNOSIS — I350 Nonrheumatic aortic (valve) stenosis: Secondary | ICD-10-CM | POA: Diagnosis not present

## 2017-06-28 DIAGNOSIS — Z6828 Body mass index (BMI) 28.0-28.9, adult: Secondary | ICD-10-CM | POA: Diagnosis not present

## 2017-06-28 DIAGNOSIS — R2681 Unsteadiness on feet: Secondary | ICD-10-CM | POA: Diagnosis not present

## 2017-06-28 DIAGNOSIS — I10 Essential (primary) hypertension: Secondary | ICD-10-CM | POA: Diagnosis not present

## 2017-06-28 DIAGNOSIS — E063 Autoimmune thyroiditis: Secondary | ICD-10-CM | POA: Diagnosis not present

## 2017-06-28 DIAGNOSIS — E1140 Type 2 diabetes mellitus with diabetic neuropathy, unspecified: Secondary | ICD-10-CM | POA: Diagnosis not present

## 2017-06-28 DIAGNOSIS — G894 Chronic pain syndrome: Secondary | ICD-10-CM | POA: Diagnosis not present

## 2017-07-01 ENCOUNTER — Other Ambulatory Visit: Payer: Self-pay | Admitting: Internal Medicine

## 2017-07-01 DIAGNOSIS — R921 Mammographic calcification found on diagnostic imaging of breast: Secondary | ICD-10-CM

## 2017-07-08 ENCOUNTER — Inpatient Hospital Stay
Admission: RE | Admit: 2017-07-08 | Discharge: 2017-07-08 | Disposition: A | Discharge status: Home / Self Care | Payer: Medicare HMO | Source: Ambulatory Visit | Attending: Internal Medicine | Admitting: Internal Medicine

## 2017-07-13 ENCOUNTER — Ambulatory Visit (HOSPITAL_COMMUNITY): Payer: Medicare HMO | Attending: Internal Medicine

## 2017-09-24 DIAGNOSIS — E119 Type 2 diabetes mellitus without complications: Secondary | ICD-10-CM | POA: Diagnosis not present

## 2017-09-24 DIAGNOSIS — I10 Essential (primary) hypertension: Secondary | ICD-10-CM | POA: Diagnosis not present

## 2017-09-24 DIAGNOSIS — M810 Age-related osteoporosis without current pathological fracture: Secondary | ICD-10-CM | POA: Diagnosis not present

## 2017-09-24 DIAGNOSIS — H409 Unspecified glaucoma: Secondary | ICD-10-CM | POA: Diagnosis not present

## 2017-09-24 DIAGNOSIS — K047 Periapical abscess without sinus: Secondary | ICD-10-CM | POA: Diagnosis not present

## 2017-09-24 DIAGNOSIS — Z79899 Other long term (current) drug therapy: Secondary | ICD-10-CM | POA: Diagnosis not present

## 2017-09-24 DIAGNOSIS — Z7984 Long term (current) use of oral hypoglycemic drugs: Secondary | ICD-10-CM | POA: Diagnosis not present

## 2017-11-19 DIAGNOSIS — I10 Essential (primary) hypertension: Secondary | ICD-10-CM | POA: Diagnosis not present

## 2017-11-19 DIAGNOSIS — E1140 Type 2 diabetes mellitus with diabetic neuropathy, unspecified: Secondary | ICD-10-CM | POA: Diagnosis not present

## 2017-11-19 DIAGNOSIS — E663 Overweight: Secondary | ICD-10-CM | POA: Diagnosis not present

## 2017-11-19 DIAGNOSIS — R69 Illness, unspecified: Secondary | ICD-10-CM | POA: Diagnosis not present

## 2017-11-19 DIAGNOSIS — R201 Hypoesthesia of skin: Secondary | ICD-10-CM | POA: Diagnosis not present

## 2017-11-19 DIAGNOSIS — Z1389 Encounter for screening for other disorder: Secondary | ICD-10-CM | POA: Diagnosis not present

## 2017-11-19 DIAGNOSIS — E782 Mixed hyperlipidemia: Secondary | ICD-10-CM | POA: Diagnosis not present

## 2017-11-19 DIAGNOSIS — G894 Chronic pain syndrome: Secondary | ICD-10-CM | POA: Diagnosis not present

## 2017-11-19 DIAGNOSIS — Z6827 Body mass index (BMI) 27.0-27.9, adult: Secondary | ICD-10-CM | POA: Diagnosis not present

## 2017-11-19 DIAGNOSIS — B351 Tinea unguium: Secondary | ICD-10-CM | POA: Diagnosis not present

## 2017-12-16 DIAGNOSIS — H524 Presbyopia: Secondary | ICD-10-CM | POA: Diagnosis not present

## 2017-12-16 DIAGNOSIS — H35361 Drusen (degenerative) of macula, right eye: Secondary | ICD-10-CM | POA: Diagnosis not present

## 2017-12-16 NOTE — Progress Notes (Deleted)
[facility] Note  12/17/2017     CHIEF COMPLAINT Patient presents for No chief complaint on file.   HISTORY OF PRESENT ILLNESS: [name] [name] is a 72 y.o. female who presents to the clinic today for:     Referring physician: Redmond School, MD [place], [place] [id number]  HISTORICAL INFORMATION:   Selected notes from the MEDICAL RECORD NUMBER Referred by Dr. Radford Pax for concern of LEE-  Ocular Hx-  PMH- DM    CURRENT MEDICATIONS: Current Outpatient Medications (Ophthalmic Drugs)  Medication Sig   latanoprost (XALATAN) 0.005 % ophthalmic solution 1 drop at bedtime.   timolol (TIMOPTIC) 0.5 % ophthalmic solution 1 drop 2 (two) times daily.   No current facility-administered medications for this visit.  (Ophthalmic Drugs)   Current Outpatient Medications (Other)  Medication Sig   ALPRAZolam (XANAX) 0.5 MG tablet Take 0.5 mg by mouth at bedtime as needed for sleep.    amLODipine (NORVASC) 10 MG tablet Take 10 mg by mouth daily.     aspirin EC 81 MG tablet Take by mouth.   Cholecalciferol ([employer] VIT D 5000 HIGH-POTENCY) 5000 UNITS capsule Take 5,000 Units by mouth daily.   cimetidine (TAGAMET) 200 MG tablet Take 200 mg by mouth daily as needed (Reflux).    Cinnamon 500 MG capsule Take 500 mg by mouth daily.   enalapril (VASOTEC) 20 MG tablet Take 1 tablet (20 mg total) by mouth 2 (two) times daily.   ferrous sulfate 325 (65 FE) MG tablet Take 325 mg by mouth daily with breakfast.   gabapentin (NEURONTIN) 300 MG capsule Take 600 mg by mouth 3 (three) times daily.    glipiZIDE (GLUCOTROL) 10 MG tablet Take 10 mg by mouth daily.    hydrochlorothiazide 25 MG tablet Take 12.5 mg by mouth daily.    HYDROcodone-acetaminophen (NORCO) 10-325 MG tablet Take 1 tablet by mouth every 8 (eight) hours as needed for moderate pain.    levothyroxine (SYNTHROID, LEVOTHROID) 88 MCG tablet Take 88 mcg by mouth daily before breakfast.    metFORMIN (GLUCOPHAGE) 1000 MG tablet Take 1,000 mg by mouth 2 (two) times daily with a meal. Take 1,000 mg tablet twice daily (at noon and at 11 pm) along with 500 mg tablet in the morning.   metFORMIN (GLUCOPHAGE) 500 MG tablet Take 500 mg by mouth daily with breakfast. Take 500 mg tablet in the morning along with 1000 mg tablet twice daily.   Omega-3 Fatty Acids (FISH OIL) 1000 MG CAPS Take 1 capsule by mouth daily.    potassium chloride SA (K-DUR,KLOR-CON) 20 MEQ tablet Take 20 mEq by mouth daily.    Prenatal Vit-Fe Fumarate-FA (PRENATAL MULTIVITAMIN) TABS Take 1 tablet by mouth daily.   simvastatin (ZOCOR) 20 MG tablet Take 20 mg by mouth at bedtime.     UNABLE TO FIND Med Name: occuvite vitamin   vitamin C (ASCORBIC ACID) 500 MG tablet Take 500 mg by mouth daily.   vitamin E 400 UNIT capsule Take 400 Units by mouth daily.   No current facility-administered medications for this visit.  (Other)      REVIEW OF SYSTEMS:    ALLERGIES No Known Allergies  PAST MEDICAL HISTORY Past Medical History:  Diagnosis Date   Aortic stenosis    Arthritis    Blood transfusion without reported diagnosis    Bronchitis    Diabetes mellitus    Diverticulitis    Glaucoma    Heart murmur  Hyperlipidemia    Hypertension    Hypothyroidism    Melanoma ([place])    left thigh, 2010   Osteoarthritis    Peptic ulcer    Rheumatoid arthritis(714.0)    Thyroid disease    Past Surgical History:  Procedure Laterality Date   ABDOMINAL HYSTERECTOMY  1988   APPENDECTOMY     BACK SURGERY  07/2008   fusion   BACK SURGERY  12/8297   complicated by post-op anemia requiring transfusion   BREAST SURGERY     right breast biopsy   CHOLECYSTECTOMY  1974   COLONOSCOPY  06/06/2007   Proximal diminutive rectal polyps cold biopsied/removed, otherwise normal rectum/ Left-sided diverticula, the remainder of the colonic mucosa appeared normal. Path-tubular adnemoa.     COLONOSCOPY  09/14/2012   BZJ:IRCVEL polyps-removed as described  above Colonic diverticulosis. Poor prep. next TCS five years.    COLONOSCOPY N/A 10/29/2016   Procedure: COLONOSCOPY;  Surgeon: Daneil Dolin, MD;  Location: [facility];  Service: Endoscopy;  Laterality: N/A;  2:00 pm   ESOPHAGOGASTRODUODENOSCOPY  09/2012   Rourk: gastric ulcer, gastric duodenal erosions, small hh. benign bx   ESOPHAGOGASTRODUODENOSCOPY N/A 10/29/2016   Procedure: ESOPHAGOGASTRODUODENOSCOPY (EGD);  Surgeon: Daneil Dolin, MD;  Location: [facility];  Service: Endoscopy;  Laterality: N/A;   EYE SURGERY     FOOT SURGERY  1994   left    LUMBAR SPINE SURGERY  08/04/2014   dr Joya Salm   macular degen     MALONEY DILATION N/A 10/29/2016   Procedure: Venia Minks DILATION;  Surgeon: Daneil Dolin, MD;  Location: [facility];  Service: Endoscopy;  Laterality: N/A;   MELANOMA EXCISION  2010    FAMILY HISTORY Family History  Problem Relation Age of Onset   Colon cancer Brother        diagnosed at age 56   Diabetes Brother    Hypertension Mother    Heart failure Mother    Diabetes Mother    Hypertension Other    Other Other    Heart failure Father        passed away of MI in his 69s   Diabetes Sister    Hypertension Sister    Breast cancer Sister    Heart attack Brother    Diabetes Sister    Hypertension Sister    Other Sister        tumor   Hypertension Sister    Hypertension Daughter    Bipolar disorder Daughter    Anesthesia problems Neg Hx     SOCIAL HISTORY Social History   Tobacco Use   Smoking status: Never Smoker   Smokeless tobacco: Never Used  Substance Use Topics   Alcohol use: No    Comment: "Wine a couple times a month"   Drug use: No         OPHTHALMIC EXAM:  Not recorded      IMAGING AND PROCEDURES  Imaging and Procedures for 12/16/17           ASSESSMENT/PLAN:    ICD-10-CM   1. Retinal edema H35.81 OCT, Retina - OU -  Both Eyes    1.  2.  3.  Ophthalmic Meds Ordered this visit:  No orders of the defined types were placed in this encounter.      No Follow-up on file.  There are no Patient Instructions on file for this visit.   Explained the diagnoses, plan, and follow up with the patient and they expressed  understanding.  Patient expressed understanding of the importance of proper follow up care.   This document serves as a record of services personally performed by Gardiner Sleeper, MD, PhD. It was created on their behalf by Catha Brow, [place], a certified ophthalmic assistant. The creation of this record is the provider's dictation and/or activities during the visit.  Electronically signed by: Catha Brow, [place]  12/16/17 2:28 PM    Gardiner Sleeper, M.D., Ph.D. Diseases & Surgery of the Retina and [place] 12/16/17     Abbreviations: M myopia (nearsighted); A astigmatism; H hyperopia (farsighted); P presbyopia; Mrx spectacle prescription;  CTL contact lenses; OD right eye; OS left eye; OU both eyes  XT exotropia; ET esotropia; PEK punctate epithelial keratitis; PEE punctate epithelial erosions; DES dry eye syndrome; MGD meibomian gland dysfunction; ATs artificial tears; PFAT's preservative free artificial tears; [place] nuclear sclerotic cataract; PSC posterior subcapsular cataract; ERM epi-retinal membrane; PVD posterior vitreous detachment; RD retinal detachment; DM diabetes mellitus; DR diabetic retinopathy; NPDR non-proliferative diabetic retinopathy; PDR proliferative diabetic retinopathy; CSME clinically significant macular edema; DME diabetic macular edema; dbh dot blot hemorrhages; CWS cotton wool spot; POAG primary open angle glaucoma; C/D cup-to-disc ratio; HVF humphrey visual field; GVF goldmann visual field; OCT optical coherence tomography; IOP intraocular pressure; BRVO Branch retinal vein occlusion; CRVO central retinal vein occlusion; CRAO  central retinal artery occlusion; BRAO branch retinal artery occlusion; RT retinal tear; SB scleral buckle; PPV pars plana vitrectomy; VH Vitreous hemorrhage; PRP panretinal laser photocoagulation; IVK intravitreal kenalog; VMT vitreomacular traction; MH Macular hole;  NVD neovascularization of the disc; NVE neovascularization elsewhere; AREDS age related eye disease study; ARMD age related macular degeneration; POAG primary open angle glaucoma; EBMD epithelial/anterior basement membrane dystrophy; ACIOL anterior chamber intraocular lens; IOL intraocular lens; PCIOL posterior chamber intraocular lens; Phaco/IOL phacoemulsification with intraocular lens placement; [place] photorefractive keratectomy; LASIK laser assisted in situ keratomileusis; HTN hypertension; DM diabetes mellitus; COPD chronic obstructive pulmonary disease

## 2017-12-17 ENCOUNTER — Encounter (INDEPENDENT_AMBULATORY_CARE_PROVIDER_SITE_OTHER): Payer: Self-pay | Admitting: Ophthalmology

## 2017-12-21 ENCOUNTER — Ambulatory Visit (INDEPENDENT_AMBULATORY_CARE_PROVIDER_SITE_OTHER): Payer: Medicare HMO | Admitting: Ophthalmology

## 2017-12-21 ENCOUNTER — Encounter (INDEPENDENT_AMBULATORY_CARE_PROVIDER_SITE_OTHER): Payer: Self-pay | Admitting: Ophthalmology

## 2017-12-21 DIAGNOSIS — H26491 Other secondary cataract, right eye: Secondary | ICD-10-CM | POA: Diagnosis not present

## 2017-12-21 DIAGNOSIS — H353132 Nonexudative age-related macular degeneration, bilateral, intermediate dry stage: Secondary | ICD-10-CM

## 2017-12-21 DIAGNOSIS — E1140 Type 2 diabetes mellitus with diabetic neuropathy, unspecified: Secondary | ICD-10-CM | POA: Diagnosis not present

## 2017-12-21 DIAGNOSIS — Z961 Presence of intraocular lens: Secondary | ICD-10-CM

## 2017-12-21 DIAGNOSIS — E119 Type 2 diabetes mellitus without complications: Secondary | ICD-10-CM

## 2017-12-21 DIAGNOSIS — H3581 Retinal edema: Secondary | ICD-10-CM

## 2017-12-21 MED ORDER — PREDNISOLONE ACETATE 1 % OP SUSP: 1.0000 [drp] | Freq: Four times a day (QID) | OPHTHALMIC | 0 refills | Status: AC

## 2017-12-21 NOTE — Progress Notes (Signed)
[facility] Note  12/21/2017     CHIEF COMPLAINT Patient presents for Retina Evaluation   HISTORY OF PRESENT ILLNESS: [name] [name] is a 72 y.o. female who presents to the clinic today for:   HPI    Retina Evaluation    In both eyes.  Duration of 4 years.  Associated Symptoms Flashes and Floaters.  Negative for Blind Spot, Photophobia, Scalp Tenderness, Fever, Pain, Glare, Jaw Claudication, Weight Loss, Distortion, Redness, Trauma, Shoulder/Hip pain and Fatigue.  Context:  distance vision, mid-range vision and near vision.  Response to treatment was no improvement.  I, the attending physician,  performed the HPI with the patient and updated documentation appropriately.          Comments    Pt presents today for YAG eval, pt was referred by Dr. 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 (History)    Pt states that she has been seeing Dr. Radford Pax for years; Pt reports that Dr. Radford Pax referred her to Dr. Herbert Deaner for cataract removal OU (2013); Pt reports she also had a Yag Cap OS done by Dr. Herbert Deaner (2016); Pt reports she was told by Dr. Radford Pax she has macular degeneration OU; Pt endorses that she is taking AREDS 2; Pt reports she has a floater OS off and on;   Referring physician: Particia Nearing, [address], [place] [id number]  HISTORICAL INFORMATION:   Selected notes from the [facility] Referred by Dr. Radford Pax for concern of  PCO OD; LEE- 01.03.19 ([facility]) [ BCVA OD: 20/30+ OD: 20/30+] Ocular Hx- POAG OU, AMD OU, on timolol OU BID and latanoprost OU qd; PMH- DM, HBP, RA    CURRENT MEDICATIONS: Current Outpatient Medications (Ophthalmic Drugs)  Medication Sig  . latanoprost (XALATAN) 0.005 % ophthalmic solution 1 drop at bedtime.  . prednisoLONE acetate (PRED FORTE) 1 % ophthalmic suspension Place 1 drop into the right eye 4 (four) times daily for 7 days.  Marland Kitchen timolol (TIMOPTIC) 0.5 % ophthalmic solution 1 drop 2 (two) times daily.   No current facility-administered medications for this visit.  (Ophthalmic Drugs)   Current Outpatient Medications (Other)  Medication Sig  . ALPRAZolam (XANAX) 0.5 MG tablet Take 0.5 mg by mouth at bedtime as needed for sleep.   Marland Kitchen amLODipine (NORVASC) 10 MG tablet Take 10 mg by mouth daily.    Marland Kitchen aspirin EC 81 MG tablet Take by mouth.  . Cholecalciferol ([employer] VIT D 5000 HIGH-POTENCY) 5000 UNITS capsule Take 5,000 Units by mouth daily.  . cimetidine (TAGAMET) 200 MG tablet Take 200 mg by mouth daily as needed (Reflux).   . Cinnamon 500 MG capsule Take 500 mg by mouth daily.  . enalapril (VASOTEC) 20 MG tablet Take 1 tablet (20 mg total) by mouth 2 (two) times daily.  . ferrous sulfate 325 (65 FE) MG tablet Take 325 mg by mouth  daily with breakfast.  . gabapentin (NEURONTIN) 300 MG capsule Take 600 mg by mouth 3 (three) times daily.   Marland Kitchen glipiZIDE (GLUCOTROL) 10 MG tablet Take 10 mg by mouth daily.   . hydrochlorothiazide 25 MG tablet Take 12.5 mg by mouth daily.   Marland Kitchen HYDROcodone-acetaminophen (NORCO) 10-325 MG tablet Take 1 tablet by mouth every 8 (eight) hours as needed for moderate pain.   Marland Kitchen levothyroxine (SYNTHROID, LEVOTHROID) 88 MCG tablet Take 88 mcg by mouth daily before breakfast.  . metFORMIN (GLUCOPHAGE) 1000 MG tablet Take 1,000 mg by mouth 2 (two) times daily with a meal. Take 1,000 mg tablet twice daily (at noon and at 11 pm) along with 500 mg tablet in the morning.  .  metFORMIN (GLUCOPHAGE) 500 MG tablet Take 500 mg by mouth daily with breakfast. Take 500 mg tablet in the morning along with 1000 mg tablet twice daily.  . Omega-3 Fatty Acids (FISH OIL) 1000 MG CAPS Take 1 capsule by mouth daily.   . potassium chloride SA (K-DUR,KLOR-CON) 20 MEQ tablet Take 20 mEq by mouth daily.   . Prenatal Vit-Fe Fumarate-FA (PRENATAL MULTIVITAMIN) TABS Take 1 tablet by mouth daily.  . simvastatin (ZOCOR) 20 MG tablet Take 20 mg by mouth at bedtime.    Marland Kitchen UNABLE TO FIND Med Name: occuvite vitamin  . vitamin C (ASCORBIC ACID) 500 MG tablet Take 500 mg by mouth daily.  . vitamin E 400 UNIT capsule Take 400 Units by mouth daily.   No current facility-administered medications for this visit.  (Other)      REVIEW OF SYSTEMS: ROS    Positive for: Endocrine, Cardiovascular, Eyes, Allergic/Imm   Negative for: Constitutional, Gastrointestinal, Neurological, Skin, Genitourinary, Musculoskeletal, HENT, Respiratory, Psychiatric, Heme/Lymph   Last edited by Debbrah Alar, COT on 12/21/2017  9:48 AM. (History)       ALLERGIES No Known Allergies  PAST MEDICAL HISTORY Past Medical History:  Diagnosis Date  . Aortic stenosis   . Arthritis   . Blood transfusion without reported diagnosis   . Bronchitis   . Diabetes mellitus   . Diverticulitis   . Glaucoma   . Heart murmur   . Hyperlipidemia   . Hypertension   . Hypothyroidism   . Macular degeneration   . Melanoma ([place])    left thigh, 2010  . Osteoarthritis   . Peptic ulcer   . Rheumatoid arthritis(714.0)   . Thyroid disease    Past Surgical History:  Procedure Laterality Date  . ABDOMINAL HYSTERECTOMY  1988  . APPENDECTOMY    . BACK SURGERY  07/2008   fusion  . BACK SURGERY  05/4331   complicated by post-op anemia requiring transfusion  . BREAST SURGERY     right breast biopsy  . CATARACT EXTRACTION    . CHOLECYSTECTOMY  1974  . COLONOSCOPY  06/06/2007   Proximal diminutive rectal polyps cold  biopsied/removed, otherwise normal rectum/ Left-sided diverticula, the remainder of the colonic mucosa appeared normal. Path-tubular adnemoa.   . COLONOSCOPY  09/14/2012   RJJ:OACZYS polyps-removed as described  above Colonic diverticulosis. Poor prep. next TCS five years.   . COLONOSCOPY N/A 10/29/2016   Procedure: COLONOSCOPY;  Surgeon: Daneil Dolin, MD;  Location: [facility];  Service: Endoscopy;  Laterality: N/A;  2:00 pm  . ESOPHAGOGASTRODUODENOSCOPY  09/2012   Rourk: gastric ulcer, gastric duodenal erosions, small hh. benign bx  . ESOPHAGOGASTRODUODENOSCOPY N/A 10/29/2016   Procedure: ESOPHAGOGASTRODUODENOSCOPY (EGD);  Surgeon: Daneil Dolin, MD;  Location: [facility];  Service: Endoscopy;  Laterality: N/A;  . EYE SURGERY    . FOOT SURGERY  1994   left   . LUMBAR SPINE SURGERY  08/04/2014   dr Joya Salm  . macular degen    . MALONEY DILATION N/A 10/29/2016   Procedure: Venia Minks DILATION;  Surgeon: Daneil Dolin, MD;  Location: [facility];  Service: Endoscopy;  Laterality: N/A;  . MELANOMA EXCISION  2010  . YAG LASER APPLICATION     OS    FAMILY HISTORY Family History  Problem Relation Age of Onset  . Colon cancer Brother        diagnosed at age 56  . Diabetes Brother   . Hypertension Mother   . Heart failure Mother   . Diabetes Mother   . Glaucoma Mother   . Hypertension Other   . Other Other   . Heart failure Father        passed away of MI in his 81s  . Diabetes Sister   . Hypertension Sister   . Breast cancer Sister   . Heart attack Brother   . Diabetes Sister   . Hypertension Sister   . Other Sister        tumor  . Hypertension Sister   . Hypertension Daughter   . Bipolar disorder Daughter   . Anesthesia problems Neg Hx   . Amblyopia Neg Hx   . Blindness Neg Hx   . Cataracts Neg Hx   . Macular degeneration Neg Hx   . Retinal detachment Neg Hx   . Strabismus Neg Hx   . Retinitis pigmentosa Neg Hx     SOCIAL HISTORY Social History   Tobacco  Use  . Smoking status: Never Smoker  . Smokeless tobacco: Never Used  Substance Use Topics  . Alcohol use: No    Comment: "Wine a couple times a month"  . Drug use: No         OPHTHALMIC EXAM:  Base Eye Exam    Visual Acuity (Snellen - Linear)      Right Left   Dist cc 20/70 -2 20/60 -2   Dist ph cc 20/40 -1 20/40 -2   Correction:  Glasses       Tonometry (Tonopen, 10:01 AM)      Right Left   Pressure 18 14       Pupils      Dark Light Shape React APD   Right 3 2 Round Sluggish None   Left 2 2 Round Sluggish None       Visual Fields (Counting fingers)      Left Right    Full Full       Extraocular Movement      Right Left    Full, Ortho Full, Ortho       Neuro/Psych    Oriented x3:  Yes   Mood/Affect:  Normal       Dilation    Both eyes:  1.0% Mydriacyl, 2.5% Phenylephrine @ 10:12 AM        Slit Lamp and Fundus Exam    Slit Lamp Exam      Right Left   Lids/Lashes Dermatochalasis - upper lid Dermatochalasis - upper lid   Conjunctiva/Sclera White and quiet White and quiet   Cornea Arcus Arcus   Anterior Chamber Deep and quiet Deep and quiet   Iris Round and dilated Round and dilated   Lens PC IOL in good postion, 2+ Posterior capsular opacification PC IOL  in good postion with open PC   Vitreous Vitreous syneresis Vitreous syneresis, Posterior vitreous detachment       Fundus Exam      Right Left   Disc Normal Normal   C/D Ratio 0.2 0.3   Macula Scattered Drusen, Blunted foveal reflex, RPE mottling and clumping, No heme Drusen, Blunted foveal reflex, RPE mottling and clumping, No heme   Vessels Trace Copper wiring Trace Copper wiring   Periphery Attached, Reticular degeneration Attached, Reticular degeneration, rare MA        Refraction    Wearing Rx      Sphere Cylinder Axis Add   Right -2.50 +3.25 015 +2.50   Left -2.25 +2.50 168 +2.50   Age:  65yrs   Type:  Bifocal       Manifest Refraction (Auto)      Sphere Cylinder Axis Dist VA    Right -2.25 +3.75 010 20/40-2   Left -2.50 +3.00 172 20/40+2          IMAGING AND PROCEDURES  Imaging and Procedures for 12/21/17  OCT, Retina - OU - Both Eyes     Right Eye Quality was good. Central Foveal Thickness: 293. Progression has no prior data. Findings include abnormal foveal contour, vitreomacular adhesion , epiretinal membrane, outer retinal atrophy, pigment epithelial detachment, retinal drusen , no SRF (Cystic changes ).   Left Eye Quality was good. Central Foveal Thickness: 326. Progression has no prior data. Findings include abnormal foveal contour, epiretinal membrane, pigment epithelial detachment, outer retinal atrophy, vitreomacular adhesion , retinal drusen , no SRF (Cystic changes).   Notes *Images captured and stored on drive  Diagnosis / Impression:  OU: Nonexudative ARMD OU  Clinical management:  See below  Abbreviations: NFP - Normal foveal profile. CME - cystoid macular edema. PED - pigment epithelial detachment. IRF - intraretinal fluid. SRF - subretinal fluid. EZ - ellipsoid zone. ERM - epiretinal membrane. ORA - outer retinal atrophy. ORT - outer retinal tubulation. SRHM - subretinal hyper-reflective material         Yag Capsulotomy - OD - Right Eye     Procedure note: YAG Capsulotomy, RIGHT Eye  Informed consent obtained. Pre-op dilating drops (1% Topicamide and 2.5% Phenylephrine), and topical anesthesia given. Power: 6.4 mJ Shots: 36 Posterior capsulotomy in cruciate formation performed without difficulty. Patient tolerated procedure well. No complications. Rx pred forte 4 times a day for 7 days, then stop. Pt received written and verbal post laser education. Recheck in 2 weeks w/ dilated exam.                 ASSESSMENT/PLAN:    ICD-10-CM   1. PCO (posterior capsular opacification), right H26.491 Yag Capsulotomy - OD - Right Eye  2. Intermediate stage nonexudative age-related macular degeneration of both eyes H35.3132    3. Diabetes mellitus type 2 without retinopathy ([place]) E11.9   4. Retinal edema H35.81 OCT, Retina - OU - Both Eyes  5. Pseudophakia of both eyes Z96.1     1. PCO OD - - 2+, visually significant  - recommend YAG Cap OD - pt wishes to proceed - RBA of procedure discussed, questions answered - informed consent obtained and signed - see procedure note - start PF QID x7 days - f/u in 2 wks  2. Non-exudative age related macular degeneration, both eyes.    - The incidence, anatomy, and pathology of dry AMD, risk of progression, and the AREDS and AREDS 2 study including smoking risks discussed with  patient.  - pt with significant PEDs and with mild cystic changes OU -- no significant IRF/SRF  - will obtain FA at next visit  - Recommend amsler grid monitoring  - continue AREDS 2  - f/u 2 wks - obtain FA at that time  3. Diabetes mellitus, type 2 without retinopathy - The incidence, risk factors for progression, natural history and treatment options for diabetic retinopathy  were discussed with patient.   - The need for close monitoring of blood glucose, blood pressure, and serum lipids, avoiding cigarette or any type of tobacco, and the need for long term follow up was also discussed with patient.  4. No retinal edema on exam or OCT  5. Pseudophakia OU-   - s/p CE/IOL OU by Dr. Herbert Deaner  - IOLs in good position, doing well  - OS s/p YAG cap  - OD PCO as above  - monitor   Ophthalmic Meds Ordered this visit:  Meds ordered this encounter  Medications  . prednisoLONE acetate (PRED FORTE) 1 % ophthalmic suspension    Sig: Place 1 drop into the right eye 4 (four) times daily for 7 days.    Dispense:  10 mL    Refill:  0       Return in about 2 weeks (around 01/04/2018) for Dilated Exam, OCT, Fluorescein Angiogram.  There are no Patient Instructions on file for this visit.   Explained the diagnoses, plan, and follow up with the patient and they expressed understanding.  Patient  expressed understanding of the importance of proper follow up care.   This document serves as a record of services personally performed by Gardiner Sleeper, MD, PhD. It was created on their behalf by Catha Brow, [place], a certified ophthalmic assistant. The creation of this record is the provider's dictation and/or activities during the visit.  Electronically signed by: Catha Brow, COA  12/21/17 1:36 PM   Gardiner Sleeper, M.D., Ph.D. Diseases & Surgery of the Retina and [place] 12/21/17   I have reviewed the above documentation for accuracy and completeness, and I agree with the above. Gardiner Sleeper, M.D., Ph.D. 12/21/17 1:36 PM     Abbreviations: M myopia (nearsighted); A astigmatism; H hyperopia (farsighted); P presbyopia; Mrx spectacle prescription;  CTL contact lenses; OD right eye; OS left eye; OU both eyes  XT exotropia; ET esotropia; PEK punctate epithelial keratitis; PEE punctate epithelial erosions; DES dry eye syndrome; MGD meibomian gland dysfunction; ATs artificial tears; PFAT's preservative free artificial tears; [place] nuclear sclerotic cataract; PSC posterior subcapsular cataract; ERM epi-retinal membrane; PVD posterior vitreous detachment; RD retinal detachment; DM diabetes mellitus; DR diabetic retinopathy; NPDR non-proliferative diabetic retinopathy; PDR proliferative diabetic retinopathy; CSME clinically significant macular edema; DME diabetic macular edema; dbh dot blot hemorrhages; CWS cotton wool spot; POAG primary open angle glaucoma; C/D cup-to-disc ratio; HVF humphrey visual field; GVF goldmann visual field; OCT optical coherence tomography; IOP intraocular pressure; BRVO Branch retinal vein occlusion; CRVO central retinal vein occlusion; CRAO central retinal artery occlusion; BRAO branch retinal artery occlusion; RT retinal tear; SB scleral buckle; PPV pars plana vitrectomy; VH Vitreous hemorrhage; PRP panretinal laser  photocoagulation; IVK intravitreal kenalog; VMT vitreomacular traction; MH Macular hole;  NVD neovascularization of the disc; NVE neovascularization elsewhere; AREDS age related eye disease study; ARMD age related macular degeneration; POAG primary open angle glaucoma; EBMD epithelial/anterior basement membrane dystrophy; ACIOL anterior chamber intraocular lens; IOL intraocular lens; PCIOL posterior chamber intraocular lens; Phaco/IOL phacoemulsification with  intraocular lens placement; [place] photorefractive keratectomy; LASIK laser assisted in situ keratomileusis; HTN hypertension; DM diabetes mellitus; COPD chronic obstructive pulmonary disease

## 2018-01-06 NOTE — Progress Notes (Deleted)
[facility] Note  01/07/2018     CHIEF COMPLAINT Patient presents for No chief complaint on file.   HISTORY OF PRESENT ILLNESS: [name] [name] is a 72 y.o. female who presents to the clinic today for:   Pt states that she has been seeing Dr. Radford Pax for years; Pt reports that Dr. Radford Pax referred her to Dr. Herbert Deaner for cataract removal OU (2013); Pt reports she also had a Yag Cap OS done by Dr. Herbert Deaner (2016); Pt reports she was told by Dr. Radford Pax she has macular degeneration OU; Pt endorses that she is taking AREDS 2; Pt reports she has a floater OS off and on;   Referring physician: Redmond School, MD [place], [place] [address]  HISTORICAL INFORMATION:   Selected notes from the [place] Referred by Dr. Radford Pax for concern of PCO OD; LEE- 01.03.19 ([place]) [ BCVA OD: 20/30+ OD: 20/30+] Ocular Hx- POAG OU, AMD OU, on timolol OU BID and latanoprost OU qd; PMH- DM, HBP, RA    CURRENT MEDICATIONS: Current Outpatient Medications (Ophthalmic Drugs)  Medication Sig   latanoprost (XALATAN) 0.005 % ophthalmic solution 1 drop at bedtime.   timolol (TIMOPTIC) 0.5 % ophthalmic solution 1 drop 2 (two) times daily.   No current facility-administered medications for this visit.  (Ophthalmic Drugs)   Current Outpatient Medications (Other)  Medication Sig   ALPRAZolam (XANAX) 0.5 MG tablet Take 0.5 mg by mouth at bedtime as needed for sleep.    amLODipine (NORVASC) 10 MG tablet Take 10 mg by mouth daily.     aspirin EC 81 MG tablet Take by mouth.   Cholecalciferol (CVS VIT D 5000 HIGH-POTENCY) 5000 UNITS capsule Take 5,000 Units by mouth daily.   cimetidine (TAGAMET) 200 MG tablet Take 200 mg by mouth daily as needed (Reflux).    Cinnamon 500 MG capsule Take 500 mg by mouth daily.   enalapril (VASOTEC) 20 MG tablet Take 1 tablet (20 mg total) by mouth 2 (two) times daily.   ferrous sulfate 325 (65 FE) MG tablet Take 325  mg by mouth daily with breakfast.   gabapentin (NEURONTIN) 300 MG capsule Take 600 mg by mouth 3 (three) times daily.    glipiZIDE (GLUCOTROL) 10 MG tablet Take 10 mg by mouth daily.    hydrochlorothiazide 25 MG tablet Take 12.5 mg by mouth daily.    HYDROcodone-acetaminophen (NORCO) 10-325 MG tablet Take 1 tablet by mouth every 8 (eight) hours as needed for moderate pain.    levothyroxine (SYNTHROID, LEVOTHROID) 88 MCG tablet Take 88 mcg by mouth daily before breakfast.   metFORMIN (GLUCOPHAGE) 1000 MG tablet Take 1,000 mg by mouth 2 (two) times daily with a meal. Take 1,000 mg tablet twice daily (at noon and at 11 pm) along with 500 mg tablet in the morning.   metFORMIN (GLUCOPHAGE) 500 MG tablet Take 500 mg by mouth daily with breakfast. Take 500 mg tablet in the morning along with 1000 mg tablet twice daily.   Omega-3 Fatty Acids (FISH OIL) 1000 MG CAPS Take 1 capsule by mouth daily.    potassium chloride SA (K-DUR,KLOR-CON) 20 MEQ tablet Take 20 mEq by mouth daily.    Prenatal Vit-Fe Fumarate-FA (PRENATAL MULTIVITAMIN) TABS Take 1 tablet by mouth daily.   simvastatin (ZOCOR) 20 MG tablet Take 20 mg by mouth at bedtime.     UNABLE TO FIND Med Name: occuvite vitamin   vitamin C (ASCORBIC ACID) 500 MG tablet Take 500  mg by mouth daily.   vitamin E 400 UNIT capsule Take 400 Units by mouth daily.   No current facility-administered medications for this visit.  (Other)      REVIEW OF SYSTEMS:    ALLERGIES No Known Allergies  PAST MEDICAL HISTORY Past Medical History:  Diagnosis Date   Aortic stenosis    Arthritis    Blood transfusion without reported diagnosis    Bronchitis    Diabetes mellitus    Diverticulitis    Glaucoma    Heart murmur    Hyperlipidemia    Hypertension    Hypothyroidism    Macular degeneration    Melanoma ([place])    left thigh, 2010   Osteoarthritis    Peptic ulcer    Rheumatoid arthritis(714.0)    Thyroid disease     Past Surgical History:  Procedure Laterality Date   ABDOMINAL HYSTERECTOMY  1988   APPENDECTOMY     BACK SURGERY  07/2008   fusion   BACK SURGERY  08/3902   complicated by post-op anemia requiring transfusion   BREAST SURGERY     right breast biopsy   CATARACT EXTRACTION     CHOLECYSTECTOMY  1974   COLONOSCOPY  06/06/2007   Proximal diminutive rectal polyps cold biopsied/removed, otherwise normal rectum/ Left-sided diverticula, the remainder of the colonic mucosa appeared normal. Path-tubular adnemoa.    COLONOSCOPY  09/14/2012   [id number] polyps-removed as described  above Colonic diverticulosis. Poor prep. next TCS five years.    COLONOSCOPY N/A 10/29/2016   Procedure: COLONOSCOPY;  Surgeon: Daneil Dolin, MD;  [facility]: [facility];  Service: Endoscopy;  Laterality: N/A;  2:00 pm   ESOPHAGOGASTRODUODENOSCOPY  09/2012   Rourk: gastric ulcer, gastric duodenal erosions, small hh. benign bx   ESOPHAGOGASTRODUODENOSCOPY N/A 10/29/2016   Procedure: ESOPHAGOGASTRODUODENOSCOPY (EGD);  Surgeon: Daneil Dolin, MD;  Location: [facility];  Service: Endoscopy;  Laterality: N/A;   EYE SURGERY     FOOT SURGERY  1994   left    LUMBAR SPINE SURGERY  08/04/2014   dr Joya Salm   macular degen     MALONEY DILATION N/A 10/29/2016   Procedure: Venia Minks DILATION;  Surgeon: Daneil Dolin, MD;  Location: [facility];  Service: Endoscopy;  Laterality: N/A;   MELANOMA EXCISION  6226   YAG LASER APPLICATION     OS    FAMILY HISTORY Family History  Problem Relation Age of Onset   Colon cancer Brother        diagnosed at age 3   Diabetes Brother    Hypertension Mother    Heart failure Mother    Diabetes Mother    Glaucoma Mother    Hypertension Other    Other Other    Heart failure Father        passed away of MI in his 65s   Diabetes Sister    Hypertension Sister    Breast cancer Sister    Heart attack Brother    Diabetes Sister    Hypertension  Sister    Other Sister        tumor   Hypertension Sister    Hypertension Daughter    Bipolar disorder Daughter    Anesthesia problems Neg Hx    Amblyopia Neg Hx    Blindness Neg Hx    Cataracts Neg Hx    Macular degeneration Neg Hx    Retinal detachment Neg Hx    Strabismus Neg Hx    Retinitis pigmentosa Neg  Hx     SOCIAL HISTORY Social History   Tobacco Use   Smoking status: Never Smoker   Smokeless tobacco: Never Used  Substance Use Topics   Alcohol use: No    Comment: "Wine a couple times a month"   Drug use: No         OPHTHALMIC EXAM:   Not recorded      IMAGING AND PROCEDURES  Imaging and Procedures for 01/06/18           ASSESSMENT/PLAN:    ICD-10-CM   1. PCO (posterior capsular opacification), right H26.491   2. Intermediate stage nonexudative age-related macular degeneration of both eyes H35.3132   3. Diabetes mellitus type 2 without retinopathy ([place]) E11.9   4. Retinal edema H35.81 OCT, Retina - OU - Both Eyes  5. Pseudophakia of both eyes Z96.1     1. PCO OD - - 2+, visually significant   -S/P Yag Cap OD (01.08.19) - completed PF QID x7 days - f/u in 2 wks  2. Non-exudative age related macular degeneration, both eyes.    - The incidence, anatomy, and pathology of dry AMD, risk of progression, and the AREDS and AREDS 2 study including smoking risks discussed with patient.  - pt with significant PEDs and with mild cystic changes OU -- no significant IRF/SRF  - will obtain FA at next visit  - Recommend amsler grid monitoring  - continue AREDS 2  - f/u 2 wks - obtain FA at that time  3. Diabetes mellitus, type 2 without retinopathy - The incidence, risk factors for progression, natural history and treatment options for diabetic retinopathy  were discussed with patient.   - The need for close monitoring of blood glucose, blood pressure, and serum lipids, avoiding cigarette or any type of tobacco, and the need for long  term follow up was also discussed with patient.  4. No retinal edema on exam or OCT  5. Pseudophakia OU-   - s/p CE/IOL OU by Dr. Herbert Deaner  - IOLs in good position, doing well  - s/p YAG cap OS  - monitor   Ophthalmic Meds Ordered this visit:  No orders of the defined types were placed in this encounter.      No Follow-up on file.  There are no Patient Instructions on file for this visit.   Explained the diagnoses, plan, and follow up with the patient and they expressed understanding.  Patient expressed understanding of the importance of proper follow up care.   This document serves as a record of services personally performed by Gardiner Sleeper, MD, PhD. It was created on their behalf by Catha Brow, [place], a certified ophthalmic assistant. The creation of this record is the provider's dictation and/or activities during the visit.  Electronically signed by: Catha Brow, COA  01/06/18 8:31 AM    Gardiner Sleeper, M.D., Ph.D. Diseases & Surgery of the Retina and Vitreous [facility] 01/06/18        Abbreviations: M myopia (nearsighted); A astigmatism; H hyperopia (farsighted); P presbyopia; Mrx spectacle prescription;  CTL contact lenses; OD right eye; OS left eye; OU both eyes  XT exotropia; ET esotropia; PEK punctate epithelial keratitis; PEE punctate epithelial erosions; DES dry eye syndrome; MGD meibomian gland dysfunction; ATs artificial tears; PFAT's preservative free artificial tears; [place] nuclear sclerotic cataract; PSC posterior subcapsular cataract; ERM epi-retinal membrane; PVD posterior vitreous detachment; RD retinal detachment; DM diabetes mellitus; DR diabetic retinopathy; NPDR non-proliferative diabetic retinopathy;  PDR proliferative diabetic retinopathy; CSME clinically significant macular edema; DME diabetic macular edema; dbh dot blot hemorrhages; CWS cotton wool spot; POAG primary open angle glaucoma; C/D cup-to-disc ratio; HVF  humphrey visual field; GVF goldmann visual field; OCT optical coherence tomography; IOP intraocular pressure; BRVO Branch retinal vein occlusion; CRVO central retinal vein occlusion; CRAO central retinal artery occlusion; BRAO branch retinal artery occlusion; RT retinal tear; SB scleral buckle; PPV pars plana vitrectomy; VH Vitreous hemorrhage; PRP panretinal laser photocoagulation; IVK intravitreal kenalog; VMT vitreomacular traction; MH Macular hole;  NVD neovascularization of the disc; NVE neovascularization elsewhere; AREDS age related eye disease study; ARMD age related macular degeneration; POAG primary open angle glaucoma; EBMD epithelial/anterior basement membrane dystrophy; ACIOL anterior chamber intraocular lens; IOL intraocular lens; PCIOL posterior chamber intraocular lens; Phaco/IOL phacoemulsification with intraocular lens placement; [place] photorefractive keratectomy; LASIK laser assisted in situ keratomileusis; HTN hypertension; DM diabetes mellitus; COPD chronic obstructive pulmonary disease

## 2018-01-07 ENCOUNTER — Encounter (INDEPENDENT_AMBULATORY_CARE_PROVIDER_SITE_OTHER): Payer: Medicare HMO | Admitting: Ophthalmology

## 2018-01-22 DIAGNOSIS — S299XXA Unspecified injury of thorax, initial encounter: Secondary | ICD-10-CM | POA: Diagnosis not present

## 2018-01-22 DIAGNOSIS — E119 Type 2 diabetes mellitus without complications: Secondary | ICD-10-CM | POA: Diagnosis not present

## 2018-01-22 DIAGNOSIS — M25511 Pain in right shoulder: Secondary | ICD-10-CM | POA: Diagnosis not present

## 2018-01-22 DIAGNOSIS — M546 Pain in thoracic spine: Secondary | ICD-10-CM | POA: Diagnosis not present

## 2018-01-22 DIAGNOSIS — R358 Other polyuria: Secondary | ICD-10-CM | POA: Diagnosis not present

## 2018-01-22 DIAGNOSIS — W010XXA Fall on same level from slipping, tripping and stumbling without subsequent striking against object, initial encounter: Secondary | ICD-10-CM | POA: Diagnosis not present

## 2018-01-22 DIAGNOSIS — I352 Nonrheumatic aortic (valve) stenosis with insufficiency: Secondary | ICD-10-CM | POA: Diagnosis not present

## 2018-01-22 DIAGNOSIS — I2510 Atherosclerotic heart disease of native coronary artery without angina pectoris: Secondary | ICD-10-CM | POA: Diagnosis not present

## 2018-01-22 DIAGNOSIS — I10 Essential (primary) hypertension: Secondary | ICD-10-CM | POA: Diagnosis not present

## 2018-01-22 DIAGNOSIS — R0789 Other chest pain: Secondary | ICD-10-CM | POA: Diagnosis not present

## 2018-01-22 DIAGNOSIS — R079 Chest pain, unspecified: Secondary | ICD-10-CM | POA: Diagnosis not present

## 2018-01-22 DIAGNOSIS — K30 Functional dyspepsia: Secondary | ICD-10-CM | POA: Diagnosis not present

## 2018-01-23 DIAGNOSIS — E119 Type 2 diabetes mellitus without complications: Secondary | ICD-10-CM | POA: Diagnosis not present

## 2018-01-23 DIAGNOSIS — R079 Chest pain, unspecified: Secondary | ICD-10-CM | POA: Diagnosis not present

## 2018-01-23 DIAGNOSIS — I10 Essential (primary) hypertension: Secondary | ICD-10-CM | POA: Diagnosis not present

## 2018-01-23 DIAGNOSIS — S299XXA Unspecified injury of thorax, initial encounter: Secondary | ICD-10-CM | POA: Diagnosis not present

## 2018-01-23 DIAGNOSIS — I2510 Atherosclerotic heart disease of native coronary artery without angina pectoris: Secondary | ICD-10-CM | POA: Diagnosis not present

## 2018-01-24 DIAGNOSIS — E119 Type 2 diabetes mellitus without complications: Secondary | ICD-10-CM | POA: Diagnosis not present

## 2018-01-24 DIAGNOSIS — I10 Essential (primary) hypertension: Secondary | ICD-10-CM | POA: Diagnosis not present

## 2018-01-24 DIAGNOSIS — R079 Chest pain, unspecified: Secondary | ICD-10-CM | POA: Diagnosis not present

## 2018-01-24 DIAGNOSIS — I2510 Atherosclerotic heart disease of native coronary artery without angina pectoris: Secondary | ICD-10-CM | POA: Diagnosis not present

## 2018-01-26 DIAGNOSIS — M810 Age-related osteoporosis without current pathological fracture: Secondary | ICD-10-CM | POA: Diagnosis not present

## 2018-01-26 DIAGNOSIS — J449 Chronic obstructive pulmonary disease, unspecified: Secondary | ICD-10-CM | POA: Diagnosis not present

## 2018-01-26 DIAGNOSIS — M5440 Lumbago with sciatica, unspecified side: Secondary | ICD-10-CM | POA: Diagnosis not present

## 2018-01-26 DIAGNOSIS — I2510 Atherosclerotic heart disease of native coronary artery without angina pectoris: Secondary | ICD-10-CM | POA: Diagnosis not present

## 2018-01-26 DIAGNOSIS — E119 Type 2 diabetes mellitus without complications: Secondary | ICD-10-CM | POA: Diagnosis not present

## 2018-01-26 DIAGNOSIS — I10 Essential (primary) hypertension: Secondary | ICD-10-CM | POA: Diagnosis not present

## 2018-01-27 ENCOUNTER — Encounter: Payer: Self-pay | Admitting: Student

## 2018-01-28 DIAGNOSIS — I10 Essential (primary) hypertension: Secondary | ICD-10-CM | POA: Diagnosis not present

## 2018-01-28 DIAGNOSIS — E119 Type 2 diabetes mellitus without complications: Secondary | ICD-10-CM | POA: Diagnosis not present

## 2018-01-28 DIAGNOSIS — M810 Age-related osteoporosis without current pathological fracture: Secondary | ICD-10-CM | POA: Diagnosis not present

## 2018-01-28 DIAGNOSIS — I2510 Atherosclerotic heart disease of native coronary artery without angina pectoris: Secondary | ICD-10-CM | POA: Diagnosis not present

## 2018-01-28 DIAGNOSIS — M5440 Lumbago with sciatica, unspecified side: Secondary | ICD-10-CM | POA: Diagnosis not present

## 2018-01-28 DIAGNOSIS — J449 Chronic obstructive pulmonary disease, unspecified: Secondary | ICD-10-CM | POA: Diagnosis not present

## 2018-01-31 DIAGNOSIS — J449 Chronic obstructive pulmonary disease, unspecified: Secondary | ICD-10-CM | POA: Diagnosis not present

## 2018-01-31 DIAGNOSIS — E119 Type 2 diabetes mellitus without complications: Secondary | ICD-10-CM | POA: Diagnosis not present

## 2018-01-31 DIAGNOSIS — I2510 Atherosclerotic heart disease of native coronary artery without angina pectoris: Secondary | ICD-10-CM | POA: Diagnosis not present

## 2018-01-31 DIAGNOSIS — M5440 Lumbago with sciatica, unspecified side: Secondary | ICD-10-CM | POA: Diagnosis not present

## 2018-01-31 DIAGNOSIS — I10 Essential (primary) hypertension: Secondary | ICD-10-CM | POA: Diagnosis not present

## 2018-01-31 DIAGNOSIS — M810 Age-related osteoporosis without current pathological fracture: Secondary | ICD-10-CM | POA: Diagnosis not present

## 2018-02-02 ENCOUNTER — Encounter: Payer: Self-pay | Admitting: Physician Assistant

## 2018-02-02 ENCOUNTER — Ambulatory Visit (INDEPENDENT_AMBULATORY_CARE_PROVIDER_SITE_OTHER): Payer: Medicare HMO | Admitting: Physician Assistant

## 2018-02-02 VITALS — BP 132/72 | HR 73 | Ht <= 58 in | Wt 133.6 lb

## 2018-02-02 DIAGNOSIS — E119 Type 2 diabetes mellitus without complications: Secondary | ICD-10-CM | POA: Diagnosis not present

## 2018-02-02 DIAGNOSIS — I060 Rheumatic aortic stenosis: Secondary | ICD-10-CM

## 2018-02-02 DIAGNOSIS — J449 Chronic obstructive pulmonary disease, unspecified: Secondary | ICD-10-CM | POA: Diagnosis not present

## 2018-02-02 DIAGNOSIS — E109 Type 1 diabetes mellitus without complications: Secondary | ICD-10-CM | POA: Diagnosis not present

## 2018-02-02 DIAGNOSIS — I10 Essential (primary) hypertension: Secondary | ICD-10-CM

## 2018-02-02 DIAGNOSIS — E782 Mixed hyperlipidemia: Secondary | ICD-10-CM

## 2018-02-02 DIAGNOSIS — R079 Chest pain, unspecified: Secondary | ICD-10-CM

## 2018-02-02 DIAGNOSIS — M5440 Lumbago with sciatica, unspecified side: Secondary | ICD-10-CM | POA: Diagnosis not present

## 2018-02-02 DIAGNOSIS — R296 Repeated falls: Secondary | ICD-10-CM | POA: Diagnosis not present

## 2018-02-02 DIAGNOSIS — M810 Age-related osteoporosis without current pathological fracture: Secondary | ICD-10-CM | POA: Diagnosis not present

## 2018-02-02 DIAGNOSIS — I2510 Atherosclerotic heart disease of native coronary artery without angina pectoris: Secondary | ICD-10-CM | POA: Diagnosis not present

## 2018-02-02 MED ORDER — AMLODIPINE BESYLATE 5 MG PO TABS: 5.0000 mg | ORAL_TABLET | Freq: Every day | ORAL | 11 refills | Status: DC

## 2018-02-02 NOTE — Addendum Note (Signed)
Addended by: Levonne Hubert on: 02/02/2018 03:41 PM   Modules accepted: Orders

## 2018-02-02 NOTE — Patient Instructions (Addendum)
Medication Instructions:  Your physician recommends that you continue on your current medications as directed. Please refer to the Current Medication list given to you today.  Decrease Norvasc to 5 mg Daily   Labwork: NONE   Testing/Procedures: Your physician has requested that you have an echocardiogram in 5-6 Months just before your next visit. Echocardiography is a painless test that uses sound waves to create images of your heart. It provides your doctor with information about the size and shape of your heart and how well your heart's chambers and valves are working. This procedure takes approximately one hour. There are no restrictions for this procedure.    Follow-Up: Your physician wants you to follow-up in: 5-6 Months with Dr. Bronson Ing. You will receive a reminder letter in the mail two months in advance. If you don't receive a letter, please call our office to schedule the follow-up appointment.  Your physician recommends that you schedule a follow-up appointment in: 1 Week with Blood Pressure Check    Any Other Special Instructions Will Be Listed Below (If Applicable). Call if you have episodes of passing out or chest pain.     If you need a refill on your cardiac medications before your next appointment, please call your pharmacy.  Thank you for choosing [facility]!   Low-Sodium Eating Plan Sodium, which is an element that makes up salt, helps you maintain a healthy balance of fluids in your body. Too much sodium can increase your blood pressure and cause fluid and waste to be held in your body. Your health care provider or dietitian may recommend following this plan if you have high blood pressure (hypertension), kidney disease, liver disease, or heart failure. Eating less sodium can help lower your blood pressure, reduce swelling, and protect your heart, liver, and kidneys. What are tips for following this plan? General guidelines  Most people on this plan  should limit their sodium intake to 1,500-2,000 mg (milligrams) of sodium each day. Reading food labels  The Nutrition Facts label lists the amount of sodium in one serving of the food. If you eat more than one serving, you must multiply the listed amount of sodium by the number of servings.  Choose foods with less than 140 mg of sodium per serving.  Avoid foods with 300 mg of sodium or more per serving. Shopping  Look for lower-sodium products, often labeled as "low-sodium" or "no salt added."  Always check the sodium content even if foods are labeled as "unsalted" or "no salt added".  Buy fresh foods. ? Avoid canned foods and premade or frozen meals. ? Avoid canned, cured, or processed meats  Buy breads that have less than 80 mg of sodium per slice. Cooking  Eat more home-cooked food and less restaurant, buffet, and fast food.  Avoid adding salt when cooking. Use salt-free seasonings or herbs instead of table salt or sea salt. Check with your health care provider or pharmacist before using salt substitutes.  Cook with plant-based oils, such as canola, sunflower, or olive oil. Meal planning  When eating at a restaurant, ask that your food be prepared with less salt or no salt, if possible.  Avoid foods that contain MSG (monosodium glutamate). MSG is sometimes added to [place] food, bouillon, and some canned foods. What foods are recommended? The items listed may not be a complete list. Talk with your dietitian about what dietary choices are best for you. Grains Low-sodium cereals, including oats, puffed wheat and rice, and shredded wheat.  Low-sodium crackers. Unsalted rice. Unsalted pasta. Low-sodium bread. Whole-grain breads and whole-grain pasta. Vegetables Fresh or frozen vegetables. "No salt added" canned vegetables. "No salt added" tomato sauce and paste. Low-sodium or reduced-sodium tomato and vegetable juice. Fruits Fresh, frozen, or canned fruit. Fruit juice. Meats and  other protein foods Fresh or frozen (no salt added) meat, poultry, seafood, and fish. Low-sodium canned tuna and salmon. Unsalted nuts. Dried peas, beans, and lentils without added salt. Unsalted canned beans. Eggs. Unsalted nut butters. Dairy Milk. Soy milk. Cheese that is naturally low in sodium, such as ricotta cheese, fresh mozzarella, or Swiss cheese Low-sodium or reduced-sodium cheese. Cream cheese. Yogurt. Fats and oils Unsalted butter. Unsalted margarine with no trans fat. Vegetable oils such as canola or olive oils. Seasonings and other foods Fresh and dried herbs and spices. Salt-free seasonings. Low-sodium mustard and ketchup. Sodium-free salad dressing. Sodium-free light mayonnaise. Fresh or refrigerated horseradish. Lemon juice. Vinegar. Homemade, reduced-sodium, or low-sodium soups. Unsalted popcorn and pretzels. Low-salt or salt-free chips. What foods are not recommended? The items listed may not be a complete list. Talk with your dietitian about what dietary choices are best for you. Grains Instant hot cereals. Bread stuffing, pancake, and biscuit mixes. Croutons. Seasoned rice or pasta mixes. Noodle soup cups. Boxed or frozen macaroni and cheese. Regular salted crackers. Self-rising flour. Vegetables Sauerkraut, pickled vegetables, and relishes. Olives. [place] fries. Onion rings. Regular canned vegetables (not low-sodium or reduced-sodium). Regular canned tomato sauce and paste (not low-sodium or reduced-sodium). Regular tomato and vegetable juice (not low-sodium or reduced-sodium). Frozen vegetables in sauces. Meats and other protein foods Meat or fish that is salted, canned, smoked, spiced, or pickled. Bacon, ham, sausage, hotdogs, corned beef, chipped beef, packaged lunch meats, salt pork, jerky, pickled herring, anchovies, regular canned tuna, sardines, salted nuts. Dairy Processed cheese and cheese spreads. Cheese curds. Blue cheese. Feta cheese. String cheese. Regular cottage  cheese. Buttermilk. Canned milk. Fats and oils Salted butter. Regular margarine. Ghee. Bacon fat. Seasonings and other foods Onion salt, garlic salt, seasoned salt, table salt, and sea salt. Canned and packaged gravies. Worcestershire sauce. Tartar sauce. Barbecue sauce. Teriyaki sauce. Soy sauce, including reduced-sodium. Steak sauce. Fish sauce. Oyster sauce. Cocktail sauce. Horseradish that you find on the shelf. Regular ketchup and mustard. Meat flavorings and tenderizers. Bouillon cubes. Hot sauce and Tabasco sauce. Premade or packaged marinades. Premade or packaged taco seasonings. Relishes. Regular salad dressings. Salsa. Potato and tortilla chips. Corn chips and puffs. Salted popcorn and pretzels. Canned or dried soups. Pizza. Frozen entrees and pot pies. Summary  Eating less sodium can help lower your blood pressure, reduce swelling, and protect your heart, liver, and kidneys.  Most people on this plan should limit their sodium intake to 1,500-2,000 mg (milligrams) of sodium each day.  Canned, boxed, and frozen foods are high in sodium. Restaurant foods, fast foods, and pizza are also very high in sodium. You also get sodium by adding salt to food.  Try to cook at home, eat more fresh fruits and vegetables, and eat less fast food, canned, processed, or prepared foods. This information is not intended to replace advice given to you by your health care provider. Make sure you discuss any questions you have with your health care provider. Document Released: 05/22/2002 Document Revised: 11/23/2016 Document Reviewed: 11/23/2016 Elsevier Interactive Patient Education  [employer].

## 2018-02-02 NOTE — Progress Notes (Signed)
Cardiology Office Note    Date:  02/02/2018   ID:  [name] [name], DOB Oct 30, 1946, MRN [id number]  PCP:  Redmond School, MD  Cardiologist: Kate Sable, MD  Chief Complaint  Patient presents with  . Follow-up  . Hospitalization Follow-up    History of Present Illness:  [name] [name] is a 72 y.o. female with history of hypertension, HLD, DM, and aortic valve stenosis last 2D echo 01/2017 LVEF 60-65% with moderate to severe AS and mild AI with grade 1 DD aortic valve area of 0.86 cm Vmax 0.9 cm mean gradient 30 mmHg peak gradient 46 mmHg.  Last saw Dr. Bronson Ing 12/2016 at which time blood pressure was elevated and enalapril increased to 20 mg twice daily.   Patient was admitted to [facility] 01/23/18 with chest pain and indigestion.  She also had a fall from tripping and fell flat on her face bruising her right eye.  She was also having chest wall pain from this.  Records reviewed and troponins negative, white count 10.9 creatinine 0.91 glucose high at 290, potassium initially low at 3.3.  01/24/18 2D echo shows moderate to severe aortic stenosis mean gradient 23 mmHg peak gradient 43 mmHg aortic valve area 1.1 cm, LVEF 60-65% echo stress test ST segment response to stress was normal stopped due to fatigue and dyspnea and achievement of 85% MPHR.  Felt to be nondiagnostic due to failure of exercise tolerance but she did achieve 85%.  EKG normal sinus rhythm with incomplete right bundle branch block and left anterior fascicular block.  Patient comes in for f/u accompanied by her daughter. When she fell last week she doesn't know if her knee gave out or if she passed out.  Does not think she lost consciousness.  It's been going on for 2 yrs. Doesn't get dizzy prior to and has no prodromal symptoms.  Describes right sided chest pain and upper right abdomen pain and across chest like her bra is cutting her off. Happens at rest and with exertion, lasts 1-2 min and is associated with  indigestion, belching and passing gas. Relieved with belching. Her dog also crawls down her right shoulder and lands on her chest and causes some soreness.  Says she is working with physical therapy and walks all around her house without difficulty.  She just has some balance issues at times.    Past Medical History:  Diagnosis Date  . Aortic stenosis   . Arthritis   . Blood transfusion without reported diagnosis   . Bronchitis   . Diabetes mellitus   . Diverticulitis   . Glaucoma   . Heart murmur   . Hyperlipidemia   . Hypertension   . Hypothyroidism   . Macular degeneration   . Melanoma ([place])    left thigh, 2010  . Osteoarthritis   . Peptic ulcer   . Rheumatoid arthritis(714.0)   . Thyroid disease     Past Surgical History:  Procedure Laterality Date  . ABDOMINAL HYSTERECTOMY  1988  . APPENDECTOMY    . BACK SURGERY  07/2008   fusion  . BACK SURGERY  06/8294   complicated by post-op anemia requiring transfusion  . BREAST SURGERY     right breast biopsy  . CATARACT EXTRACTION    . CHOLECYSTECTOMY  1974  . COLONOSCOPY  06/06/2007   Proximal diminutive rectal polyps cold biopsied/removed, otherwise normal rectum/ Left-sided diverticula, the remainder of the colonic mucosa appeared normal. Path-tubular adnemoa.   . COLONOSCOPY  09/14/2012  PYK:DXIPJA polyps-removed as described  above Colonic diverticulosis. Poor prep. next TCS five years.   . COLONOSCOPY N/A 10/29/2016   Procedure: COLONOSCOPY;  Surgeon: Daneil Dolin, MD;  [facility]: [facility];  Service: Endoscopy;  Laterality: N/A;  2:00 pm  . ESOPHAGOGASTRODUODENOSCOPY  09/2012   Rourk: gastric ulcer, gastric duodenal erosions, small hh. benign bx  . ESOPHAGOGASTRODUODENOSCOPY N/A 10/29/2016   Procedure: ESOPHAGOGASTRODUODENOSCOPY (EGD);  Surgeon: Daneil Dolin, MD;  Location: [facility];  Service: Endoscopy;  Laterality: N/A;  . EYE SURGERY    . FOOT SURGERY  1994   left   . LUMBAR SPINE SURGERY  08/04/2014     dr Joya Salm  . macular degen    . MALONEY DILATION N/A 10/29/2016   Procedure: Venia Minks DILATION;  Surgeon: Daneil Dolin, MD;  Location: [facility];  Service: Endoscopy;  Laterality: N/A;  . MELANOMA EXCISION  2010  . YAG LASER APPLICATION     OS    Current Medications: Current Meds  Medication Sig  . ALPRAZolam (XANAX) 0.5 MG tablet Take 0.5 mg by mouth at bedtime as needed for sleep.   Marland Kitchen amLODipine (NORVASC) 10 MG tablet Take 10 mg by mouth daily.    Marland Kitchen aspirin EC 81 MG tablet Take by mouth.  . Cholecalciferol ([employer] VIT D 5000 HIGH-POTENCY) 5000 UNITS capsule Take 5,000 Units by mouth daily.  . cimetidine (TAGAMET) 200 MG tablet Take 200 mg by mouth daily as needed (Reflux).   . Cinnamon 500 MG capsule Take 500 mg by mouth daily.  . enalapril (VASOTEC) 20 MG tablet Take 1 tablet (20 mg total) by mouth 2 (two) times daily.  . ferrous sulfate 325 (65 FE) MG tablet Take 325 mg by mouth daily with breakfast.  . gabapentin (NEURONTIN) 300 MG capsule Take 600 mg by mouth 3 (three) times daily.   Marland Kitchen glipiZIDE (GLUCOTROL) 10 MG tablet Take 10 mg by mouth daily.   Marland Kitchen HYDROcodone-acetaminophen (NORCO) 10-325 MG tablet Take 1 tablet by mouth every 8 (eight) hours as needed for moderate pain.   Marland Kitchen latanoprost (XALATAN) 0.005 % ophthalmic solution 1 drop at bedtime.  Marland Kitchen levothyroxine (SYNTHROID, LEVOTHROID) 88 MCG tablet Take 88 mcg by mouth daily before breakfast.  . metFORMIN (GLUCOPHAGE) 1000 MG tablet Take 1,000 mg by mouth 2 (two) times daily with a meal. Take 1,000 mg tablet twice daily (at noon and at 11 pm) along with 500 mg tablet in the morning.  . Omega-3 Fatty Acids (FISH OIL) 1000 MG CAPS Take 1 capsule by mouth daily.   . potassium chloride SA (K-DUR,KLOR-CON) 20 MEQ tablet Take 20 mEq by mouth daily.   . Prenatal Vit-Fe Fumarate-FA (PRENATAL MULTIVITAMIN) TABS Take 1 tablet by mouth daily.  . simvastatin (ZOCOR) 20 MG tablet Take 20 mg by mouth at bedtime.    . timolol (TIMOPTIC) 0.5  % ophthalmic solution 1 drop 2 (two) times daily.  Marland Kitchen UNABLE TO FIND Med Name: occuvite vitamin  . vitamin C (ASCORBIC ACID) 500 MG tablet Take 500 mg by mouth daily.  . vitamin E 400 UNIT capsule Take 400 Units by mouth daily.     Allergies:   Patient has no known allergies.   Social History   Socioeconomic History  . Marital status: Widowed    Spouse name: None  . Number of children: None  . Years of education: None  . Highest education level: None  Social Needs  . Financial resource strain: None  . Food insecurity -  worry: None  . Food insecurity - inability: None  . Transportation needs - medical: None  . Transportation needs - non-medical: None  Occupational History  . None  Tobacco Use  . Smoking status: Never Smoker  . Smokeless tobacco: Never Used  Substance and Sexual Activity  . Alcohol use: No    Comment: "Wine a couple times a month"  . Drug use: No  . Sexual activity: Yes    Birth control/protection: Surgical  Other Topics Concern  . None  Social History Narrative   Husband deceased Apr 19, 2011.      Family History:  The patient's family history includes Bipolar disorder in her daughter; Breast cancer in her sister; Colon cancer in her brother; Diabetes in her brother, mother, sister, and sister; Glaucoma in her mother; Heart attack in her brother; Heart failure in her father and mother; Hypertension in her daughter, mother, other, sister, sister, and sister; Other in her other and sister.   ROS:   Please see the history of present illness.    Review of Systems  Constitution: Negative.  HENT: Negative.   Eyes: Negative.   Cardiovascular: Positive for chest pain.  Respiratory: Negative.   Hematologic/Lymphatic: Negative.   Musculoskeletal: Positive for arthritis and falls. Negative for joint pain.  Gastrointestinal: Positive for abdominal pain, flatus and heartburn.  Genitourinary: Negative.   Neurological: Positive for loss of balance.    Psychiatric/Behavioral: Positive for memory loss.   All other systems reviewed and are negative.   PHYSICAL EXAM:   VS:  BP 132/72   Pulse 73   Ht 4\' 9"  (1.448 m)   Wt 133 lb 9.6 oz (60.6 kg)   SpO2 98%   BMI 28.91 kg/m   Physical Exam  GEN: Well nourished, well developed, in no acute distress  Neck: Murmur heard in the carotids no JVD, carotid bruits, or masses Cardiac:RRR; normal S1 decreased S2-3 to 4/6 harsh systolic murmur at the left sternal border Respiratory:  clear to auscultation bilaterally, normal work of breathing GI: soft, nontender, nondistended, + BS Ext: without cyanosis, clubbing, or edema, Good distal pulses bilaterally Neuro:  Alert and Oriented x 3 Psych: euthymic mood, full affect  Wt Readings from Last 3 Encounters:  02/02/18 133 lb 9.6 oz (60.6 kg)  01/07/17 135 lb 6.4 oz (61.4 kg)  10/29/16 138 lb (62.6 kg)      Studies/Labs Reviewed:   EKG:  EKG is not ordered today.  The ekg reviewed from [facility] hospital see above. Recent Labs: No results found for requested labs within last 8760 hours.   Lipid Panel No results found for: CHOL, TRIG, HDL, CHOLHDL, VLDL, LDLCALC, LDLDIRECT  Additional studies/ records that were reviewed today include:  01/24/18 done at [facility] 2D echo shows moderate to severe aortic stenosis mean gradient 23 mmHg peak gradient 43 mmHg aortic valve area 1.1 cm, LVEF 60-65% echo stress test [employer] segment response to stress was normal stopped due to fatigue and dyspnea and achievement of 85% MPHR.  Felt to be nondiagnostic due to failure of exercise tolerance EKG normal sinus rhythm with incomplete right bundle branch block and left anterior fascicular block   2D echo 1/2018Study Conclusions   - Left ventricle: The cavity size was normal. Wall thickness was   increased in a pattern of mild LVH. Systolic function was normal.   The estimated ejection fraction was in the range of 60% to 65%.   Wall motion was  normal; there were no regional wall  motion   abnormalities. Doppler parameters are consistent with abnormal   left ventricular relaxation (grade 1 diastolic dysfunction). - Aortic valve: There was moderate to severe stenosis. There was   trivial regurgitation. Mean gradient (S): 30 mm Hg. Peak gradient   (S): 46 mm Hg. VTI ratio of LVOT to aortic valve: 0.23. Valve   area (Vmax): 0.97 cm^2. Valve area (Vmean): 0.85 cm^2. - Mitral valve: Calcified annulus. Mildly calcified leaflets .   There was mild regurgitation. - Left atrium: The atrium was mildly dilated. - Right atrium: Central venous pressure (est): 3 mm Hg. - Atrial septum: No defect or patent foramen ovale was identified. - Tricuspid valve: There was mild regurgitation. - Pulmonary arteries: PA peak pressure: 17 mm Hg (S). - Pericardium, extracardiac: There was no pericardial effusion.   Impressions:   - Mild LVH with LVEF 60-65% and grade 1 diastolic dysfunction. Mild   left atrial enlargement. Calcified mitral annulus and leaflets   with mild mitral regurgitation. Moderate to severe calcific   aortic stenosis with trivial regurgitation as outlined above.   Mild tricuspid regurgitation with normal estimated PASP.   Aortic valve:   Trileaflet; moderately calcified leaflets. Doppler:   There was moderate to severe stenosis.   There was trivial regurgitation.    VTI ratio of LVOT to aortic valve: 0.23. Valve area (VTI): 0.86 cm^2. Indexed valve area (VTI): 0.54 cm^2/m^2. Peak velocity ratio of LVOT to aortic valve: 0.26. Valve area (Vmax): 0.97 cm^2. Indexed valve area (Vmax): 0.61 cm^2/m^2. Mean velocity ratio of LVOT to aortic valve: 0.22. Valve area (Vmean): 0.85 cm^2. Indexed valve area (Vmean): 0.53 cm^2/m^2. Mean gradient (S): 30 mm Hg. Peak gradient (S): 46 mm Hg.    ASSESSMENT:    1. Rheumatic aortic stenosis   2. Essential hypertension   3. Mixed hyperlipidemia   4. Type 1 diabetes mellitus without complication  (HCC)      PLAN:  In order of problems listed above:  Moderate to severe aortic stenosis with recent hospitalization at [facility]  for chest pain felt to be GI and MS.  2D echo LVEF 60-65% aortic valve area 1.1 cm mean gradient 23 mmHg peak gradient 43 mmHg.  Stress test nondiagnostic due to failure of exercise tolerance although she achieved 85% MPHR.  Aortic stenosis has definitely progressed but is still moderate to severe.  She has had some falls but does not sound like she has had any dizziness or syncope. Will decrease her amlodipine to 5 mg daily and may need to come off completely with AS. Come back for BP check in 2 weeks. Low salt diet. Continue to monitor closely.  Call us if she has dizziness, syncope or exertional chest pain.  We will repeat 2D echo and follow-up with Dr. Bronson Ing in 5-6 months.  Chest pain associated with indigestion and flatus relieved with belching and improved with Tagamet.  Stress test was negative at [facility]  01/23/18.  No further workup at this time.  Frequent falls with right knee giving out.  Patient denies loss of consciousness, dizziness or prodromal symptoms to suggest syncope.  Essential hypertension blood pressure controlled on enalapril, amlodipine but will decrease amlodipine b/c of AS. F/u BP check  Mixed hyperlipidemia continue Zocor  Diabetes mellitus type 1 on Glucophage      Medication Adjustments/Labs and Tests Ordered: Current medicines are reviewed at length with the patient today.  Concerns regarding medicines are outlined above.  Medication changes, Labs and Tests ordered today are  listed in the Patient Instructions below. Patient Instructions  Medication Instructions:  Your physician recommends that you continue on your current medications as directed. Please refer to the Current Medication list given to you today.   Labwork: NONE   Testing/Procedures: Your physician has requested that you have an echocardiogram  in 5-6 Months just before your next visit. Echocardiography is a painless test that uses sound waves to create images of your heart. It provides your doctor with information about the size and shape of your heart and how well your heart's chambers and valves are working. This procedure takes approximately one hour. There are no restrictions for this procedure.    Follow-Up: Your physician wants you to follow-up in: 5-6 Months with Dr. Bronson Ing. You will receive a reminder letter in the mail two months in advance. If you don't receive a letter, please call our office to schedule the follow-up appointment.   Any Other Special Instructions Will Be Listed Below (If Applicable). Call if you have episodes of passing out or chest pain.     If you need a refill on your cardiac medications before your next appointment, please call your pharmacy.  Thank you for choosing [facility]!      Signed, Ermalinda Barrios, PA-C  02/02/2018 2:28 PM    [facility] [place], [place], [place]  [address] Phone: [phone]; Fax: [phone]

## 2018-02-09 ENCOUNTER — Ambulatory Visit: Payer: Medicare HMO

## 2018-02-10 ENCOUNTER — Telehealth: Payer: Self-pay | Admitting: *Deleted

## 2018-02-10 ENCOUNTER — Ambulatory Visit (INDEPENDENT_AMBULATORY_CARE_PROVIDER_SITE_OTHER): Payer: Medicare HMO | Admitting: *Deleted

## 2018-02-10 DIAGNOSIS — I10 Essential (primary) hypertension: Secondary | ICD-10-CM

## 2018-02-10 DIAGNOSIS — R69 Illness, unspecified: Secondary | ICD-10-CM | POA: Diagnosis not present

## 2018-02-10 NOTE — Progress Notes (Signed)
Pt here for BP check after decrease of amlodipine to 5 mg on 2/20 - pt denies any symptoms at this time. Says she has been compliant with medications. BP today is 142/82 HR 67

## 2018-02-10 NOTE — Telephone Encounter (Signed)
-----   Message from Herminio Commons, MD sent at 02/10/2018  9:42 AM EST -----   ----- Message ----- From: Massie Maroon, CMA Sent: 02/10/2018   8:52 AM To: Herminio Commons, MD

## 2018-02-10 NOTE — Progress Notes (Signed)
Continue for now at same dose.

## 2018-02-10 NOTE — Progress Notes (Signed)
Pt aware.

## 2018-02-16 DIAGNOSIS — I10 Essential (primary) hypertension: Secondary | ICD-10-CM | POA: Diagnosis not present

## 2018-02-16 DIAGNOSIS — K5732 Diverticulitis of large intestine without perforation or abscess without bleeding: Secondary | ICD-10-CM | POA: Diagnosis not present

## 2018-02-16 DIAGNOSIS — M159 Polyosteoarthritis, unspecified: Secondary | ICD-10-CM | POA: Diagnosis not present

## 2018-02-16 DIAGNOSIS — E063 Autoimmune thyroiditis: Secondary | ICD-10-CM | POA: Diagnosis not present

## 2018-02-16 DIAGNOSIS — E119 Type 2 diabetes mellitus without complications: Secondary | ICD-10-CM | POA: Diagnosis not present

## 2018-02-16 DIAGNOSIS — Z6827 Body mass index (BMI) 27.0-27.9, adult: Secondary | ICD-10-CM | POA: Diagnosis not present

## 2018-02-16 DIAGNOSIS — E782 Mixed hyperlipidemia: Secondary | ICD-10-CM | POA: Diagnosis not present

## 2018-02-16 DIAGNOSIS — I350 Nonrheumatic aortic (valve) stenosis: Secondary | ICD-10-CM | POA: Diagnosis not present

## 2018-02-16 DIAGNOSIS — S20211A Contusion of right front wall of thorax, initial encounter: Secondary | ICD-10-CM | POA: Diagnosis not present

## 2018-02-16 DIAGNOSIS — H409 Unspecified glaucoma: Secondary | ICD-10-CM | POA: Diagnosis not present

## 2018-04-15 DIAGNOSIS — R0789 Other chest pain: Secondary | ICD-10-CM | POA: Diagnosis not present

## 2018-04-15 DIAGNOSIS — R7989 Other specified abnormal findings of blood chemistry: Secondary | ICD-10-CM | POA: Diagnosis not present

## 2018-04-15 DIAGNOSIS — M1990 Unspecified osteoarthritis, unspecified site: Secondary | ICD-10-CM | POA: Diagnosis not present

## 2018-04-15 DIAGNOSIS — S299XXA Unspecified injury of thorax, initial encounter: Secondary | ICD-10-CM | POA: Diagnosis not present

## 2018-04-15 DIAGNOSIS — I10 Essential (primary) hypertension: Secondary | ICD-10-CM | POA: Diagnosis not present

## 2018-04-15 DIAGNOSIS — M069 Rheumatoid arthritis, unspecified: Secondary | ICD-10-CM | POA: Diagnosis not present

## 2018-04-15 DIAGNOSIS — N390 Urinary tract infection, site not specified: Secondary | ICD-10-CM | POA: Diagnosis not present

## 2018-04-15 DIAGNOSIS — S20219A Contusion of unspecified front wall of thorax, initial encounter: Secondary | ICD-10-CM | POA: Diagnosis not present

## 2018-04-15 DIAGNOSIS — E119 Type 2 diabetes mellitus without complications: Secondary | ICD-10-CM | POA: Diagnosis not present

## 2018-04-15 DIAGNOSIS — M810 Age-related osteoporosis without current pathological fracture: Secondary | ICD-10-CM | POA: Diagnosis not present

## 2018-04-15 DIAGNOSIS — S20211A Contusion of right front wall of thorax, initial encounter: Secondary | ICD-10-CM | POA: Diagnosis not present

## 2018-04-15 DIAGNOSIS — R109 Unspecified abdominal pain: Secondary | ICD-10-CM | POA: Diagnosis not present

## 2018-04-15 DIAGNOSIS — W182XXA Fall in (into) shower or empty bathtub, initial encounter: Secondary | ICD-10-CM | POA: Diagnosis not present

## 2018-04-15 DIAGNOSIS — Z7984 Long term (current) use of oral hypoglycemic drugs: Secondary | ICD-10-CM | POA: Diagnosis not present

## 2018-04-15 DIAGNOSIS — Z79899 Other long term (current) drug therapy: Secondary | ICD-10-CM | POA: Diagnosis not present

## 2018-04-16 DIAGNOSIS — R7989 Other specified abnormal findings of blood chemistry: Secondary | ICD-10-CM | POA: Diagnosis not present

## 2018-05-17 DIAGNOSIS — R69 Illness, unspecified: Secondary | ICD-10-CM | POA: Diagnosis not present

## 2018-05-17 DIAGNOSIS — E663 Overweight: Secondary | ICD-10-CM | POA: Diagnosis not present

## 2018-05-17 DIAGNOSIS — Z6828 Body mass index (BMI) 28.0-28.9, adult: Secondary | ICD-10-CM | POA: Diagnosis not present

## 2018-05-17 DIAGNOSIS — M1990 Unspecified osteoarthritis, unspecified site: Secondary | ICD-10-CM | POA: Diagnosis not present

## 2018-05-17 DIAGNOSIS — G894 Chronic pain syndrome: Secondary | ICD-10-CM | POA: Diagnosis not present

## 2018-06-22 DIAGNOSIS — J45909 Unspecified asthma, uncomplicated: Secondary | ICD-10-CM | POA: Diagnosis not present

## 2018-06-22 DIAGNOSIS — Z79891 Long term (current) use of opiate analgesic: Secondary | ICD-10-CM | POA: Diagnosis not present

## 2018-06-22 DIAGNOSIS — Z7984 Long term (current) use of oral hypoglycemic drugs: Secondary | ICD-10-CM | POA: Diagnosis not present

## 2018-06-22 DIAGNOSIS — Z7982 Long term (current) use of aspirin: Secondary | ICD-10-CM | POA: Diagnosis not present

## 2018-06-22 DIAGNOSIS — M25531 Pain in right wrist: Secondary | ICD-10-CM | POA: Diagnosis not present

## 2018-06-22 DIAGNOSIS — Z79899 Other long term (current) drug therapy: Secondary | ICD-10-CM | POA: Diagnosis not present

## 2018-07-13 ENCOUNTER — Ambulatory Visit (INDEPENDENT_AMBULATORY_CARE_PROVIDER_SITE_OTHER): Payer: Medicare HMO

## 2018-07-13 ENCOUNTER — Other Ambulatory Visit: Payer: Self-pay

## 2018-07-13 DIAGNOSIS — I060 Rheumatic aortic stenosis: Secondary | ICD-10-CM

## 2018-07-13 DIAGNOSIS — I10 Essential (primary) hypertension: Secondary | ICD-10-CM

## 2018-07-14 ENCOUNTER — Telehealth: Payer: Self-pay | Admitting: *Deleted

## 2018-07-14 NOTE — Telephone Encounter (Signed)
-----   Message from Imogene Burn, PA-C sent at 07/14/2018  2:09 PM EDT ----- Patient has mod-severe Aortic stenosis. Normal heart function. Please schedule her with dr. Harriette Bouillon, not me. She had syncope and could be TAVR candidate

## 2018-07-14 NOTE — Telephone Encounter (Signed)
Called patient with test results. No answer. Left message to call back.  

## 2018-07-15 DIAGNOSIS — E119 Type 2 diabetes mellitus without complications: Secondary | ICD-10-CM | POA: Diagnosis not present

## 2018-07-15 DIAGNOSIS — R201 Hypoesthesia of skin: Secondary | ICD-10-CM | POA: Diagnosis not present

## 2018-07-15 DIAGNOSIS — G894 Chronic pain syndrome: Secondary | ICD-10-CM | POA: Diagnosis not present

## 2018-07-15 DIAGNOSIS — E782 Mixed hyperlipidemia: Secondary | ICD-10-CM | POA: Diagnosis not present

## 2018-07-15 DIAGNOSIS — I350 Nonrheumatic aortic (valve) stenosis: Secondary | ICD-10-CM | POA: Diagnosis not present

## 2018-07-15 DIAGNOSIS — N3281 Overactive bladder: Secondary | ICD-10-CM | POA: Diagnosis not present

## 2018-07-15 DIAGNOSIS — R2681 Unsteadiness on feet: Secondary | ICD-10-CM | POA: Diagnosis not present

## 2018-07-15 DIAGNOSIS — Z6828 Body mass index (BMI) 28.0-28.9, adult: Secondary | ICD-10-CM | POA: Diagnosis not present

## 2018-07-15 DIAGNOSIS — Z0001 Encounter for general adult medical examination with abnormal findings: Secondary | ICD-10-CM | POA: Diagnosis not present

## 2018-07-15 DIAGNOSIS — I10 Essential (primary) hypertension: Secondary | ICD-10-CM | POA: Diagnosis not present

## 2018-07-18 DIAGNOSIS — E782 Mixed hyperlipidemia: Secondary | ICD-10-CM | POA: Diagnosis not present

## 2018-07-18 DIAGNOSIS — E119 Type 2 diabetes mellitus without complications: Secondary | ICD-10-CM | POA: Diagnosis not present

## 2018-07-20 ENCOUNTER — Encounter: Payer: Self-pay | Admitting: Cardiovascular Disease

## 2018-07-20 ENCOUNTER — Ambulatory Visit (INDEPENDENT_AMBULATORY_CARE_PROVIDER_SITE_OTHER): Payer: Medicare HMO | Admitting: Cardiovascular Disease

## 2018-07-20 ENCOUNTER — Other Ambulatory Visit: Payer: Self-pay

## 2018-07-20 VITALS — BP 193/84 | HR 60 | Ht <= 58 in | Wt 136.0 lb

## 2018-07-20 DIAGNOSIS — E785 Hyperlipidemia, unspecified: Secondary | ICD-10-CM | POA: Diagnosis not present

## 2018-07-20 DIAGNOSIS — I10 Essential (primary) hypertension: Secondary | ICD-10-CM

## 2018-07-20 DIAGNOSIS — I350 Nonrheumatic aortic (valve) stenosis: Secondary | ICD-10-CM

## 2018-07-20 DIAGNOSIS — E119 Type 2 diabetes mellitus without complications: Secondary | ICD-10-CM | POA: Diagnosis not present

## 2018-07-20 MED ORDER — AMLODIPINE BESYLATE 10 MG PO TABS: 10.0000 mg | ORAL_TABLET | Freq: Every day | ORAL | 1 refills | Status: DC

## 2018-07-20 NOTE — Progress Notes (Signed)
SUBJECTIVE: The patient presents for routine follow-up.  I last saw her on 01/07/2017.  She has a history of aortic stenosis and hypertension..  She underwent a nondiagnostic stress test due to failure of exercise tolerance although she achieved 85% of maximum predicted heart rate at [facility] in February 2019.  She recently underwent an echocardiogram on 07/13/2018 which I reviewed which demonstrated normal left ventricular systolic function and normal regional wall motion, LVEF 50 to 55%, mild to moderate LVH, grade 1 diastolic dysfunction, possibly severe, low gradient aortic stenosis (mean gradient 20 mmHg), valve area 0.53 cm by VTI, mild aortic regurgitation, mild mitral regurgitation, and mild tricuspid regurgitation.  Pulmonary pressures were normal.  She walks with a cane because she frequently falls up to 1-2 times per week.  She has some right knee weakness but denies pain.  She denies chest pain and does have exertional dyspnea which she attributes to chronic bronchitis.  She used to work at [employer] and inhaled a lot of particles.  Her daughter who is with her says she is noticed that her mother has become more short of breath in the past 2 months.  She denies leg swelling, orthopnea, and syncope.  Review of Systems: As per "subjective", otherwise negative.  No Known Allergies  Current Outpatient Medications  Medication Sig Dispense Refill  . ALPRAZolam (XANAX) 0.5 MG tablet Take 0.5 mg by mouth at bedtime as needed for sleep.     Marland Kitchen amLODipine (NORVASC) 5 MG tablet Take 5 mg by mouth daily.    Marland Kitchen aspirin EC 81 MG tablet Take by mouth.    . Cholecalciferol ([employer] VIT D 5000 HIGH-POTENCY) 5000 UNITS capsule Take 5,000 Units by mouth daily.    . cimetidine (TAGAMET) 200 MG tablet Take 200 mg by mouth daily as needed (Reflux).     . Cinnamon 500 MG capsule Take 500 mg by mouth daily.    . enalapril (VASOTEC) 20 MG tablet Take 1 tablet (20 mg total) by mouth 2 (two) times  daily. 60 tablet 6  . ferrous sulfate 325 (65 FE) MG tablet Take 325 mg by mouth daily with breakfast.    . gabapentin (NEURONTIN) 300 MG capsule Take 600 mg by mouth 3 (three) times daily.     Marland Kitchen glipiZIDE (GLUCOTROL) 10 MG tablet Take 10 mg by mouth daily.     Marland Kitchen HYDROcodone-acetaminophen (NORCO) 10-325 MG tablet Take 1 tablet by mouth every 8 (eight) hours as needed for moderate pain.     Marland Kitchen latanoprost (XALATAN) 0.005 % ophthalmic solution 1 drop at bedtime.    Marland Kitchen levothyroxine (SYNTHROID, LEVOTHROID) 88 MCG tablet Take 88 mcg by mouth daily before breakfast.    . metFORMIN (GLUCOPHAGE) 1000 MG tablet Take 1,000 mg by mouth 2 (two) times daily with a meal. Take 1,000 mg tablet twice daily (at noon and at 11 pm) along with 500 mg tablet in the morning.    . Omega-3 Fatty Acids (FISH OIL) 1000 MG CAPS Take 1 capsule by mouth daily.     . potassium chloride SA (K-DUR,KLOR-CON) 20 MEQ tablet Take 20 mEq by mouth daily.     . Prenatal Vit-Fe Fumarate-FA (PRENATAL MULTIVITAMIN) TABS Take 1 tablet by mouth daily.    . simvastatin (ZOCOR) 20 MG tablet Take 20 mg by mouth at bedtime.      . timolol (TIMOPTIC) 0.5 % ophthalmic solution 1 drop 2 (two) times daily.    Marland Kitchen UNABLE TO FIND Med Name:  occuvite vitamin    . VENTOLIN HFA 108 (90 Base) MCG/ACT inhaler INHALE 2 PUFFS BY MOUTH EVERY 4 HOURS FOR WHEEZING  0  . vitamin C (ASCORBIC ACID) 500 MG tablet Take 500 mg by mouth daily.    . vitamin E 400 UNIT capsule Take 400 Units by mouth daily.     No current facility-administered medications for this visit.     Past Medical History:  Diagnosis Date  . Aortic stenosis   . Arthritis   . Blood transfusion without reported diagnosis   . Bronchitis   . Diabetes mellitus   . Diverticulitis   . Glaucoma   . Heart murmur   . Hyperlipidemia   . Hypertension   . Hypothyroidism   . Macular degeneration   . Melanoma ([place])    left thigh, 2010  . Osteoarthritis   . Peptic ulcer   . Rheumatoid  arthritis(714.0)   . Thyroid disease     Past Surgical History:  Procedure Laterality Date  . ABDOMINAL HYSTERECTOMY  1988  . APPENDECTOMY    . BACK SURGERY  07/2008   fusion  . BACK SURGERY  12/6107   complicated by post-op anemia requiring transfusion  . BREAST SURGERY     right breast biopsy  . CATARACT EXTRACTION    . CHOLECYSTECTOMY  1974  . COLONOSCOPY  06/06/2007   Proximal diminutive rectal polyps cold biopsied/removed, otherwise normal rectum/ Left-sided diverticula, the remainder of the colonic mucosa appeared normal. Path-tubular adnemoa.   . COLONOSCOPY  09/14/2012   [id number] polyps-removed as described  above Colonic diverticulosis. Poor prep. next TCS five years.   . COLONOSCOPY N/A 10/29/2016   Procedure: COLONOSCOPY;  Surgeon: Daneil Dolin, MD;  Location: [facility];  Service: Endoscopy;  Laterality: N/A;  2:00 pm  . ESOPHAGOGASTRODUODENOSCOPY  09/2012   Rourk: gastric ulcer, gastric duodenal erosions, small hh. benign bx  . ESOPHAGOGASTRODUODENOSCOPY N/A 10/29/2016   Procedure: ESOPHAGOGASTRODUODENOSCOPY (EGD);  Surgeon: Daneil Dolin, MD;  Location: [facility];  Service: Endoscopy;  Laterality: N/A;  . EYE SURGERY    . FOOT SURGERY  1994   left   . LUMBAR SPINE SURGERY  08/04/2014   dr Joya Salm  . macular degen    . MALONEY DILATION N/A 10/29/2016   Procedure: Venia Minks DILATION;  Surgeon: Daneil Dolin, MD;  Location: [facility];  Service: Endoscopy;  Laterality: N/A;  . MELANOMA EXCISION  2010  . YAG LASER APPLICATION     OS    Social History   Socioeconomic History  . Marital status: Widowed    Spouse name: Not on file  . Number of children: Not on file  . Years of education: Not on file  . Highest education level: Not on file  Occupational History  . Not on file  Social Needs  . Financial resource strain: Not on file  . Food insecurity:    Worry: Not on file    Inability: Not on file  . Transportation needs:    Medical: Not on file      Non-medical: Not on file  Tobacco Use  . Smoking status: Never Smoker  . Smokeless tobacco: Never Used  Substance and Sexual Activity  . Alcohol use: No    Comment: "Wine a couple times a month"  . Drug use: No  . Sexual activity: Yes    Birth control/protection: Surgical  Lifestyle  . Physical activity:    Days per week: Not on file  Minutes per session: Not on file  . Stress: Not on file  Relationships  . Social connections:    Talks on phone: Not on file    Gets together: Not on file    Attends religious service: Not on file    Active member of club or organization: Not on file    Attends meetings of clubs or organizations: Not on file    Relationship status: Not on file  . Intimate partner violence:    Fear of current or ex partner: Not on file    Emotionally abused: Not on file    Physically abused: Not on file    Forced sexual activity: Not on file  Other Topics Concern  . Not on file  Social History Narrative   Husband deceased 2011-04-23.      Vitals:   07/20/18 0859  BP: (!) 193/84  Pulse: 60  SpO2: 97%  Weight: 136 lb (61.7 kg)  Height: 4\' 9"  (1.448 m)    Wt Readings from Last 3 Encounters:  07/20/18 136 lb (61.7 kg)  02/02/18 133 lb 9.6 oz (60.6 kg)  01/07/17 135 lb 6.4 oz (61.4 kg)     PHYSICAL EXAM General: NAD HEENT: Normal. Neck: No JVD, no thyromegaly. Lungs: Diminished air movement, no crackles or wheezes. CV: Regular rate and rhythm, normal S1/diminished S2, no S3/S4, 3/6 ejection systolic murmur loudest over right upper sternal border. No pretibial or periankle edema.  No carotid bruit.   Abdomen: Soft, nontender, no distention.  Neurologic: Alert and oriented.  Psych: Normal affect. Skin: Normal. Musculoskeletal: No gross deformities.    ECG: Reviewed above under Subjective   Labs: Lab Results  Component Value Date/Time   K 3.3 (L) 07/25/2014 02:20 PM   BUN 15 07/25/2014 02:20 PM   CREATININE 0.72 07/25/2014 02:20 PM    HGB 14.0 07/25/2014 02:20 PM     Lipids: No results found for: LDLCALC, LDLDIRECT, CHOL, TRIG, HDL     ASSESSMENT AND PLAN: 1.  Severe aortic stenosis: Left ventricular systolic function and pulmonary pressures are normal.  She may have low gradient, severe stenosis as per echocardiogram reviewed above.  As per her daughter, the patient has become progressively more short of breath.  She denies chest pain and syncope but does not get any meaningful physical activity.  Stress test was nondiagnostic.  She may be a candidate for TAVR.  I will make a referral to the structural heart team.  2.  Hypertension: Blood pressure is markedly elevated.  I will increase amlodipine back to 10 mg.  She is also on enalapril.  3.  Hyperlipidemia: On simvastatin 20 mg.  4.  Type 2 diabetes mellitus: Currently on metformin.    Disposition: Follow up with structural heart team.  Timeframe for follow-up with me to be determined.  Time spent: 40 minutes, of which greater than 50% was spent reviewing symptoms, relevant blood tests and studies, and discussing management plan with the patient.    Kate Sable, M.D., F.A.C.C.

## 2018-07-20 NOTE — Patient Instructions (Addendum)
Your physician recommends that you schedule a follow-up appointment in: TO BE DETERMINED WITH DR Roselle physician has recommended you make the following change in your medication:   START AMLODIPINE 10 MG DAILY   You have been referred to [facility]  Thank you for choosing [facility]!!

## 2018-07-26 NOTE — Addendum Note (Signed)
Addended by: Julian Hy T on: 07/26/2018 07:54 AM   Modules accepted: Orders

## 2018-08-01 ENCOUNTER — Encounter: Payer: Self-pay | Admitting: Physician Assistant

## 2018-08-04 ENCOUNTER — Ambulatory Visit: Payer: Medicare HMO | Admitting: Cardiovascular Disease

## 2018-08-04 ENCOUNTER — Ambulatory Visit (INDEPENDENT_AMBULATORY_CARE_PROVIDER_SITE_OTHER): Payer: Medicare HMO | Admitting: Cardiovascular Disease

## 2018-08-04 ENCOUNTER — Encounter: Payer: Self-pay | Admitting: Cardiovascular Disease

## 2018-08-04 VITALS — BP 130/80 | HR 91 | Ht <= 58 in | Wt 136.4 lb

## 2018-08-04 DIAGNOSIS — I350 Nonrheumatic aortic (valve) stenosis: Secondary | ICD-10-CM

## 2018-08-04 NOTE — Progress Notes (Signed)
Valve Clinic Consult Note  Chief Complaint  Patient presents with  . New Patient (Initial Visit)    severe aortic stenosis   History of Present Illness: 72 yo female with history of hypertension, HLD, hypothyroidism, diabetes, chronic bronchitis, rheumatoid arthritis and severe aortic stenosis who is referred to the valve clinic today by Dr. Bronson Ing for further discussion regarding her aortic stenosis and possible TAVR. Her aortic stenosis has been moderate for several years. Most recent echo July 2019 with normal LV systolic function with NOBS=96-28%, mild to moderate LVH, grade 1 diastolic dysfunction. The aortic valve leaflets are calcified with restricted opening. Mean gradient 20 mmHg, peak gradient 33 mmHg, AVA 0.53 cm2, DVI 0.19. Stroke volume/bsa 19.4. This is felt to be consistent with paradoxical low flow/low gradient aortic stenosis. She was seen in the office by Dr. Bronson Ing 07/20/18 and c/o dyspnea with exertion. She has chronic lung disease but her family has noticed a difference in her breathing over the past few months. She denies chest pain, lower extremity edema, orthopnea, PND. She walks with a cane due to balance issues. She reports "passing out" once per week. It is unclear if this is true syncope or if she falls but she reports losing consciousness several times. She has no known CAD. She has never smoked. She is here today with her daughters. 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 She lives with her daughter in [place]. She has not seen a dentist in over a year. She has had several teeth fall out. She thinks she has several bad teeth now.   Primary Care Physician: Redmond School, MD Primary Cardiologist: Dr. Bronson Ing Referring Cardiologist: Dr. Bronson Ing  Past Medical History:  Diagnosis Date  . Aortic stenosis   . Arthritis   . Blood transfusion without reported diagnosis   . Bronchitis   . Diabetes mellitus   .  Diverticulitis   . Glaucoma   . Hyperlipidemia   . Hypertension   . Hypothyroidism   . Macular degeneration   . Melanoma ([place])    left thigh, 2010  . Osteoarthritis   . Peptic ulcer   . Rheumatoid arthritis(714.0)   . Thyroid disease     Past Surgical History:  Procedure Laterality Date  . ABDOMINAL HYSTERECTOMY  1988  . APPENDECTOMY    . BACK SURGERY  07/2008   fusion  . BACK SURGERY  02/6628   complicated by post-op anemia requiring transfusion  . BREAST SURGERY     right breast biopsy  . CATARACT EXTRACTION    . CHOLECYSTECTOMY  1974  . COLONOSCOPY  06/06/2007   Proximal diminutive rectal polyps cold biopsied/removed, otherwise normal rectum/ Left-sided diverticula, the remainder of the colonic mucosa appeared normal. Path-tubular adnemoa.   . COLONOSCOPY  09/14/2012   UTM:LYYTKP polyps-removed as described  above Colonic diverticulosis. Poor prep. next TCS five years.   . COLONOSCOPY N/A 10/29/2016   Procedure: COLONOSCOPY;  Surgeon: Daneil Dolin, MD;  Location: [facility];  Service: Endoscopy;  Laterality: N/A;  2:00 pm  . ESOPHAGOGASTRODUODENOSCOPY  09/2012   Rourk: gastric ulcer, gastric duodenal erosions, small hh. benign bx  . ESOPHAGOGASTRODUODENOSCOPY N/A 10/29/2016   Procedure: ESOPHAGOGASTRODUODENOSCOPY (EGD);  Surgeon: Daneil Dolin, MD;  Location: [facility];  Service: Endoscopy;  Laterality: N/A;  . EYE SURGERY    . FOOT SURGERY  1994   left   . LUMBAR SPINE SURGERY  08/04/2014   dr  botero  . macular degen    . MALONEY DILATION N/A 10/29/2016   Procedure: Venia Minks DILATION;  Surgeon: Daneil Dolin, MD;  Location: [facility];  Service: Endoscopy;  Laterality: N/A;  . MELANOMA EXCISION  2010  . YAG LASER APPLICATION     OS    Current Outpatient Medications  Medication Sig Dispense Refill  . ALPRAZolam (XANAX) 0.5 MG tablet Take 0.5 mg by mouth at bedtime as needed for sleep.     Marland Kitchen amLODipine (NORVASC) 10 MG tablet Take 1 tablet (10 mg total)  by mouth daily. 90 tablet 1  . aspirin EC 81 MG tablet Take by mouth.    . Cholecalciferol ([employer] VIT D 5000 HIGH-POTENCY) 5000 UNITS capsule Take 5,000 Units by mouth daily.    . cimetidine (TAGAMET) 200 MG tablet Take 200 mg by mouth daily as needed (Reflux).     . Cinnamon 500 MG capsule Take 500 mg by mouth daily.    . enalapril (VASOTEC) 20 MG tablet Take 1 tablet (20 mg total) by mouth 2 (two) times daily. 60 tablet 6  . ferrous sulfate 325 (65 FE) MG tablet Take 325 mg by mouth daily with breakfast.    . gabapentin (NEURONTIN) 300 MG capsule Take 600 mg by mouth 3 (three) times daily.     Marland Kitchen glipiZIDE (GLUCOTROL) 10 MG tablet Take 10 mg by mouth daily.     Marland Kitchen HYDROcodone-acetaminophen (NORCO) 10-325 MG tablet Take 1 tablet by mouth every 8 (eight) hours as needed for moderate pain.     Marland Kitchen latanoprost (XALATAN) 0.005 % ophthalmic solution 1 drop at bedtime.    Marland Kitchen levothyroxine (SYNTHROID, LEVOTHROID) 88 MCG tablet Take 88 mcg by mouth daily before breakfast.    . metFORMIN (GLUCOPHAGE) 1000 MG tablet Take 1,000 mg by mouth 2 (two) times daily with a meal. Take 1,000 mg tablet twice daily (at noon and at 11 pm) along with 500 mg tablet in the morning.    . Omega-3 Fatty Acids (FISH OIL) 1000 MG CAPS Take 1 capsule by mouth daily.     . potassium chloride SA (K-DUR,KLOR-CON) 20 MEQ tablet Take 20 mEq by mouth daily.     . Prenatal Vit-Fe Fumarate-FA (PRENATAL MULTIVITAMIN) TABS Take 1 tablet by mouth daily.    . simvastatin (ZOCOR) 20 MG tablet Take 20 mg by mouth at bedtime.      . timolol (TIMOPTIC) 0.5 % ophthalmic solution 1 drop 2 (two) times daily.    Marland Kitchen UNABLE TO FIND Med Name: occuvite vitamin    . VENTOLIN HFA 108 (90 Base) MCG/ACT inhaler INHALE 2 PUFFS BY MOUTH EVERY 4 HOURS FOR WHEEZING  0  . vitamin C (ASCORBIC ACID) 500 MG tablet Take 500 mg by mouth daily.    . vitamin E 400 UNIT capsule Take 400 Units by mouth daily.     No current facility-administered medications for this visit.      No Known Allergies  Social History   Socioeconomic History  . Marital status: Widowed    Spouse name: Not on file  . Number of children: 2  . Years of education: Not on file  . Highest education level: Not on file  Occupational History  . Not on file  Social Needs  . Financial resource strain: Not on file  . Food insecurity:    Worry: Not on file    Inability: Not on file  . Transportation needs:    Medical: Not on file  Non-medical: Not on file  Tobacco Use  . Smoking status: Never Smoker  . Smokeless tobacco: Never Used  Substance and Sexual Activity  . Alcohol use: No    Comment: "Wine a couple times a month"  . Drug use: No  . Sexual activity: Yes    Birth control/protection: Surgical  Lifestyle  . Physical activity:    Days per week: Not on file    Minutes per session: Not on file  . Stress: Not on file  Relationships  . Social connections:    Talks on phone: Not on file    Gets together: Not on file    Attends religious service: Not on file    Active member of club or organization: Not on file    Attends meetings of clubs or organizations: Not on file    Relationship status: Not on file  . Intimate partner violence:    Fear of current or ex partner: Not on file    Emotionally abused: Not on file    Physically abused: Not on file    Forced sexual activity: Not on file  Other Topics Concern  . Not on file  Social History Narrative   Husband deceased 04-07-11.     Family History  Problem Relation Age of Onset  . Colon cancer Brother        diagnosed at age 36  . Diabetes Brother   . Hypertension Mother   . Heart failure Mother   . Diabetes Mother   . Glaucoma Mother   . Hypertension Other   . Other Other   . Heart failure Father        passed away of MI in his 23s  . Diabetes Sister   . Hypertension Sister   . Breast cancer Sister   . Heart attack Brother   . Diabetes Sister   . Hypertension Sister   . Other Sister        tumor  .  Hypertension Sister   . Hypertension Daughter   . Bipolar disorder Daughter   . Anesthesia problems Neg Hx   . Amblyopia Neg Hx   . Blindness Neg Hx   . Cataracts Neg Hx   . Macular degeneration Neg Hx   . Retinal detachment Neg Hx   . Strabismus Neg Hx   . Retinitis pigmentosa Neg Hx     Review of Systems:  As stated in the HPI and otherwise negative.   BP 130/80   Pulse 91   Ht 4\' 9"  (1.448 m)   Wt 136 lb 6.4 oz (61.9 kg)   SpO2 93%   BMI 29.52 kg/m   Physical Examination: General: Well developed, well nourished, NAD  HEENT: OP clear, mucus membranes moist  SKIN: warm, dry. No rashes. Neuro: No focal deficits  Musculoskeletal: Muscle strength 5/5 all ext  Psychiatric: Mood and affect normal  Neck: No JVD, no carotid bruits, no thyromegaly, no lymphadenopathy.  Lungs:Clear bilaterally, no wheezes, rhonci, crackles Cardiovascular: Regular rate and rhythm. Loud, harsh, late peaking systolic murmur.  Abdomen:Soft. Bowel sounds present. Non-tender.  Extremities: No lower extremity edema. Pulses are 2 + in the bilateral DP/PT.  Echo 07/13/18: Left ventricle: The cavity size was normal. Wall thickness was   increased increased in a pattern of mild to moderate LVH.   Systolic function was normal. The estimated ejection fraction was   in the range of 50% to 55%. Wall motion was normal; there were no   regional wall motion abnormalities.  Doppler parameters are   consistent with abnormal left ventricular relaxation (grade 1   diastolic dysfunction). - Aortic valve: Mildly to moderately calcified annulus. Probably   trileaflet; moderately calcified leaflets. There was mild   regurgitation. Mean gradient (S): 20 mm Hg. Peak gradient (S): 33   mm Hg. VTI ratio of LVOT to aortic valve: 0.19. Possible severe,   low-gradient aortic stenosis. Valve area (VTI): 0.53 cm^2. Valve   area (Vmax): 0.62 cm^2. Valve area (Vmean): 0.54 cm^2. - Mitral valve: Mildly calcified leaflets. There  was mild   regurgitation. - Left atrium: The atrium was at the upper limits of normal in   size. - Right atrium: Central venous pressure (est): 3 mm Hg. - Atrial septum: No defect or patent foramen ovale was identified. - Tricuspid valve: There was mild regurgitation. - Pulmonary arteries: PA peak pressure: 15 mm Hg (S). - Pericardium, extracardiac: There was no pericardial effusion. ------------------------------------------------------------------- Left ventricle:  The cavity size was normal. Wall thickness was increased increased in a pattern of mild to moderate LVH. Systolic function was normal. The estimated ejection fraction was in the range of 50% to 55%. Wall motion was normal; there were no regional wall motion abnormalities. Doppler parameters are consistent with abnormal left ventricular relaxation (grade 1 diastolic dysfunction).  ------------------------------------------------------------------- Aortic valve:   Mildly to moderately calcified annulus. Probably trileaflet; moderately calcified leaflets. Possible severe, low-gradient aortic stenosis.  Doppler:  There was mild regurgitation.    VTI ratio of LVOT to aortic valve: 0.19. Valve area (VTI): 0.53 cm^2. Indexed valve area (VTI): 0.33 cm^2/m^2. Peak velocity ratio of LVOT to aortic valve: 0.22. Valve area (Vmax): 0.62 cm^2. Indexed valve area (Vmax): 0.39 cm^2/m^2. Mean velocity ratio of LVOT to aortic valve: 0.19. Valve area (Vmean): 0.54 cm^2. Indexed valve area (Vmean): 0.34 cm^2/m^2.    Mean gradient (S): 20 mm Hg. Peak gradient (S): 33 mm Hg.  ------------------------------------------------------------------- Aorta:  Aortic root: The aortic root was normal in size.  ------------------------------------------------------------------- Mitral valve:  Mildly calcified leaflets.  Doppler:  There was mild regurgitation.    Peak gradient (D): 3 mm  Hg.  ------------------------------------------------------------------- Left atrium:  The atrium was at the upper limits of normal in size.   ------------------------------------------------------------------- Atrial septum:  No defect or patent foramen ovale was identified.   ------------------------------------------------------------------- Right ventricle:  The cavity size was normal. Systolic function was normal.  ------------------------------------------------------------------- Pulmonic valve:    The valve appears to be grossly normal. Doppler:  There was trivial regurgitation.  ------------------------------------------------------------------- Tricuspid valve:   The valve appears to be grossly normal. Doppler:  There was mild regurgitation.  ------------------------------------------------------------------- Right atrium:  The atrium was normal in size.  ------------------------------------------------------------------- Pericardium:  There was no pericardial effusion.  ------------------------------------------------------------------- Systemic veins: Inferior vena cava: The vessel was normal in size. The respirophasic diameter changes were in the normal range (>= 50%), consistent with normal central venous pressure.  ------------------------------------------------------------------- Measurements   Left ventricle                           Value          Reference  LV ID, ED, PLAX chordal                  48.2  mm       43 - 52  LV ID, ES, PLAX chordal  35.2  mm       23 - 38  LV fx shortening, PLAX chordal   (L)     27    %        >=29  LV PW thickness, ED                      14.7  mm       ----------  IVS/LV PW ratio, ED                      0.76           <=1.3  Stroke volume, 2D                        38    ml       ----------  Stroke volume/bsa, 2D                    24    ml/m^2   ----------  LV ejection fraction, 1-p A4C             57    %        ----------  LV end-diastolic volume, 2-p             60    ml       ----------  LV end-systolic volume, 2-p              29    ml       ----------  LV ejection fraction, 2-p                52    %        ----------  Stroke volume, 2-p                       31    ml       ----------  LV end-diastolic volume/bsa, 2-p         38    ml/m^2   ----------  LV end-systolic volume/bsa, 2-p          18    ml/m^2   ----------  Stroke volume/bsa, 2-p                   19.4  ml/m^2   ----------  LV e&', lateral                           4.18  cm/s     ----------  LV E/e&', lateral                         20.07          ----------  LV e&', medial                            2.23  cm/s     ----------  LV E/e&', medial                          37.62          ----------  LV e&', average  3.21  cm/s     ----------  LV E/e&', average                         26.18          ----------    Ventricular septum                       Value          Reference  IVS thickness, ED                        11.1  mm       ----------    LVOT                                     Value          Reference  LVOT ID, S                               19    mm       ----------  LVOT area                                2.84  cm^2     ----------  LVOT peak velocity, S                    61.9  cm/s     ----------  LVOT mean velocity, S                    40.8  cm/s     ----------  LVOT VTI, S                              12    cm       ----------    Aortic valve                             Value          Reference  Aortic valve peak velocity, S            285   cm/s     ----------  Aortic valve mean velocity, S            213   cm/s     ----------  Aortic valve VTI, S                      64.8  cm       ----------  Aortic mean gradient, S                  20    mm Hg    ----------  Aortic peak gradient, S                  33    mm Hg    ----------  VTI ratio, LVOT/AV                       0.19            ----------  Aortic valve area, VTI                   0.53  cm^2     ----------  Aortic valve area/bsa, VTI               0.33  cm^2/m^2 ----------  Velocity ratio, peak, LVOT/AV            0.22           ----------  Aortic valve area, peak velocity         0.62  cm^2     ----------  Aortic valve area/bsa, peak              0.39  cm^2/m^2 ----------  velocity  Velocity ratio, mean, LVOT/AV            0.19           ----------  Aortic valve area, mean velocity         0.54  cm^2     ----------  Aortic valve area/bsa, mean              0.34  cm^2/m^2 ----------  velocity  Aortic regurg pressure half-time         573   ms       ----------    Aorta                                    Value          Reference  Aortic root ID, ED                       32    mm       ----------    Left atrium                              Value          Reference  LA ID, A-P, ES                           31    mm       ----------  LA ID/bsa, A-P                           1.96  cm/m^2   <=2.2  LA volume, S                             42    ml       ----------  LA volume/bsa, S                         26.5  ml/m^2   ----------  LA volume, ES, 1-p A4C                   43.2  ml       ----------  LA volume/bsa, ES, 1-p A4C               27.2  ml/m^2   ----------  LA volume, ES, 1-p A2C  38.2  ml       ----------  LA volume/bsa, ES, 1-p A2C               24.1  ml/m^2   ----------    Mitral valve                             Value          Reference  Mitral E-wave peak velocity              83.9  cm/s     ----------  Mitral A-wave peak velocity              137   cm/s     ----------  Mitral deceleration time         (H)     330   ms       150 - 230  Mitral peak gradient, D                  3     mm Hg    ----------  Mitral E/A ratio, peak                   0.6            ----------  Mitral maximal regurg velocity,          519   cm/s     ----------  PISA    Pulmonary arteries                        Value          Reference  PA pressure, S, DP                       15    mm Hg    <=30    Tricuspid valve                          Value          Reference  Tricuspid regurg peak velocity           172   cm/s     ----------  Tricuspid peak RV-RA gradient            12    mm Hg    ----------    Right atrium                             Value          Reference  RA ID, S-I, ES, A4C                      40.3  mm       34 - 49  RA area, ES, A4C                 (L)     8.07  cm^2     8.3 - 19.5  RA volume, ES, A/L                       12.9  ml       ----------  RA volume/bsa, ES, A/L  8.1   ml/m^2   ----------    Systemic veins                           Value          Reference  Estimated CVP                            3     mm Hg    ----------    Right ventricle                          Value          Reference  TAPSE                                    18.2  mm       ----------  RV pressure, S, DP                       15    mm Hg    <=30  RV s&', lateral, S                        10.4  cm/s     ----------  EKG:  EKG is ordered today. The ekg ordered today demonstrates NSR, rate 89 bpm. Incomplete RBBB . QTc 501 msec  Recent Labs: 08/04/2018: BUN 21; Creatinine, Ser 1.35; Hemoglobin 13.0; Platelets 284; Potassium 3.8; Sodium 139   Lipid Panel No results found for: CHOL, TRIG, HDL, CHOLHDL, VLDL, LDLCALC, LDLDIRECT   Wt Readings from Last 3 Encounters:  08/04/18 136 lb 6.4 oz (61.9 kg)  07/20/18 136 lb (61.7 kg)  02/02/18 133 lb 9.6 oz (60.6 kg)     Other studies Reviewed: Additional studies/ records that were reviewed today include: echo images, office notes.  Review of the above records demonstrates: severe low output aortic stenosis   Assessment and Plan:   1. Severe aortic stenosis: She has symptoms that are c/w severe aortic stenosis. Her mean gradient is low but her valve is clearly diseased and does not open well.  AVA 0.53 cm2. Dimensionless  index 0.19 and the stroke volume/bsa is 19.4, all suggesting severe aortic stenosis. This likely represents paradoxical low flow, low gradient severe aortic stenosis. I have personally reviewed the echo images. The aortic valve is thickened, calcified with limited leaflet mobility. I think she would benefit from AVR. She will be considered for TAVR.    I have reviewed the natural history of aortic stenosis with the patient and their family members  who are present today. We have discussed the limitations of medical therapy and the poor prognosis associated with symptomatic aortic stenosis. We have reviewed potential treatment options, including palliative medical therapy, conventional surgical aortic valve replacement, and transcatheter aortic valve replacement. We discussed treatment options in the context of the patient's specific comorbid medical conditions.   STS Risk Score: Procedure: Isolated AVR  Risk of Mortality: 2.939%  Renal Failure: 1.309%  Permanent Stroke: 1.446%  Prolonged Ventilation: 7.709%  DSW Infection: 0.132%  Reoperation: 3.157%  Morbidity or Mortality: 11.961%  Short Length of Stay: 37.977%  Long Length of Stay: 6.005%   She would like to proceed with planning for TAVR. I will arrange  a right and left heart catheterization at [facility] 08/11/18 at 10:30 am. Risks and benefits of the cath procedure and the TAVR procedure reviewed with the patient. After the cath, she will have a cardiac CT, CTA of the chest/abdomen and pelvis, PFTs, carotid artery dopplers and a PT assessment. She will then be referred to see one of the CT surgeons on our TAVR team.  She will need a referral to our dental clinic. I would suspect that she will need multiple extractions prior to her valve procedure.   Current medicines are reviewed at length with the patient today.  The patient does not have concerns regarding medicines.  The following changes have been made:  no change  Labs/ tests ordered  today include:   Orders Placed This Encounter  Procedures  . Basic Metabolic Panel (BMET)  . CBC  . EKG 12-Lead     Disposition:   Follow up with the valve team following her cardiac cath.    Signed, Lauree Chandler, MD 08/05/2018 7:32 AM    [facility] [place], [place],   [address] Phone: [phone]; Fax: [phone]

## 2018-08-04 NOTE — Patient Instructions (Addendum)
Medication Instructions:  Your physician recommends that you continue on your current medications as directed. Please refer to the Current Medication list given to you today. See instructions below for catheterization  Labwork: Lab work was done in office today  Testing/Procedures: Your physician has requested that you have a cardiac catheterization. Cardiac catheterization is used to diagnose and/or treat various heart conditions. Doctors may recommend this procedure for a number of different reasons. The most common reason is to evaluate chest pain. Chest pain can be a symptom of coronary artery disease (CAD), and cardiac catheterization can show whether plaque is narrowing or blocking your heart's arteries. This procedure is also used to evaluate the valves, as well as measure the blood flow and oxygen levels in different parts of your heart. For further information please visit [email or web address]. Please follow instruction sheet, as given.  Scheduled for August 29,2019  Follow-Up: To be arranged after catheterization.  Any Other Special Instructions Will Be Listed Below (If Applicable).      [facility] OFFICE [place], [place] [address] Dept: [phone] Loc: [place]  08/04/2018  You are scheduled for a Cardiac Catheterization on Thursday, August 29 with Dr. Lauree Chandler.  1. Please arrive at the [facility] (Main Entrance A) at [facility]: [address] [place], [place] [address] at 7:00 AM (This time is two hours before your procedure to ensure your preparation). Free valet parking service is available.   Special note: Every effort is made to have your procedure done on time. Please understand that emergencies sometimes delay scheduled procedures.  2. Diet: Do not eat solid foods after midnight.  The patient may have clear liquids until  5am upon the day of the procedure.  3. Labs: Lab work was done in office on August 22,2019  4. Medication instructions in preparation for your procedure:   Contrast Allergy: No  Do not take any diabetes medication the morning of the procedure. Do not take metformin the morning of the procedure and for 48 hours after procedure.     On the morning of your procedure, take your Aspirin and any morning medicines NOT listed above.  You may use sips of water.  5. Plan for one night stay--bring personal belongings. 6. Bring a current list of your medications and current insurance cards. 7. You MUST have a responsible person to drive you home. 8. Someone MUST be with you the first 24 hours after you arrive home or your discharge will be delayed. 9. Please wear clothes that are easy to get on and off and wear slip-on shoes.  Thank you for allowing [place] to care for you!   -- [facility]   If you need a refill on your cardiac medications before your next appointment, please call your pharmacy.

## 2018-08-05 ENCOUNTER — Telehealth (HOSPITAL_COMMUNITY): Payer: Self-pay

## 2018-08-05 ENCOUNTER — Other Ambulatory Visit: Payer: Self-pay | Admitting: Physician Assistant

## 2018-08-05 DIAGNOSIS — K029 Dental caries, unspecified: Secondary | ICD-10-CM

## 2018-08-05 DIAGNOSIS — K047 Periapical abscess without sinus: Principal | ICD-10-CM

## 2018-08-05 LAB — BASIC METABOLIC PANEL
BUN/Creatinine Ratio: 16 (ref 12–28)
BUN: 21 mg/dL (ref 8–27)
CALCIUM: 10.3 mg/dL (ref 8.7–10.3)
CO2: 23 mmol/L (ref 20–29)
CREATININE: 1.35 mg/dL — AB (ref 0.57–1.00)
Chloride: 98 mmol/L (ref 96–106)
GFR calc Af Amer: 45 mL/min/{1.73_m2} — ABNORMAL LOW (ref >59)
GFR calc non Af Amer: 39 mL/min/{1.73_m2} — ABNORMAL LOW (ref >59)
GLUCOSE: 194 mg/dL — AB (ref 65–99)
Potassium: 3.8 mmol/L (ref 3.5–5.2)
SODIUM: 139 mmol/L (ref 134–144)

## 2018-08-05 LAB — CBC
HEMATOCRIT: 38.6 % (ref 34.0–46.6)
Hemoglobin: 13 g/dL (ref 11.1–15.9)
MCH: 29.3 pg (ref 26.6–33.0)
MCHC: 33.7 g/dL (ref 31.5–35.7)
MCV: 87 fL (ref 79–97)
PLATELETS: 284 10*3/uL (ref 150–450)
RBC: 4.44 x10E6/uL (ref 3.77–5.28)
RDW: 14.9 % (ref 12.3–15.4)
WBC: 10.5 10*3/uL (ref 3.4–10.8)

## 2018-08-05 NOTE — Progress Notes (Signed)
dent

## 2018-08-08 ENCOUNTER — Ambulatory Visit: Payer: Medicare HMO | Admitting: Physician Assistant

## 2018-08-08 ENCOUNTER — Telehealth: Payer: Self-pay

## 2018-08-08 ENCOUNTER — Telehealth (HOSPITAL_COMMUNITY): Payer: Self-pay

## 2018-08-08 NOTE — Telephone Encounter (Signed)
Call to patient to discuss hospital procedure for "suspected" bed bugs.  Pt acknowledged she had a bug crawling on her when seen in the office last Thursday.  Instructed pt to shower and put on clean clothes right before leaving for the hospital the day of her cath.  Also discussed that per Anderson Malta in the cath lab her procedure was rescheduled until Tuesday September 3rd.  Explained to pt she would receive further instructions in the coming days regarding her cath. She verbalized understanding of information provided.

## 2018-08-09 ENCOUNTER — Other Ambulatory Visit (HOSPITAL_COMMUNITY): Payer: Medicare HMO | Admitting: Dentistry

## 2018-08-09 ENCOUNTER — Other Ambulatory Visit: Payer: Self-pay

## 2018-08-09 ENCOUNTER — Inpatient Hospital Stay (HOSPITAL_COMMUNITY)
Admission: AD | Admit: 2018-08-09 | Discharge: 2018-08-18 | DRG: 287 | Disposition: A | Discharge status: Home Health | Payer: Medicare HMO | Source: Other Acute Inpatient Hospital | Attending: Internal Medicine | Admitting: Internal Medicine

## 2018-08-09 ENCOUNTER — Encounter (HOSPITAL_COMMUNITY): Payer: Self-pay

## 2018-08-09 ENCOUNTER — Observation Stay (HOSPITAL_COMMUNITY): Payer: Medicare HMO

## 2018-08-09 DIAGNOSIS — K083 Retained dental root: Secondary | ICD-10-CM | POA: Diagnosis not present

## 2018-08-09 DIAGNOSIS — E876 Hypokalemia: Secondary | ICD-10-CM | POA: Diagnosis not present

## 2018-08-09 DIAGNOSIS — E1165 Type 2 diabetes mellitus with hyperglycemia: Secondary | ICD-10-CM | POA: Diagnosis present

## 2018-08-09 DIAGNOSIS — T380X5A Adverse effect of glucocorticoids and synthetic analogues, initial encounter: Secondary | ICD-10-CM | POA: Diagnosis not present

## 2018-08-09 DIAGNOSIS — Z7982 Long term (current) use of aspirin: Secondary | ICD-10-CM | POA: Diagnosis not present

## 2018-08-09 DIAGNOSIS — Z23 Encounter for immunization: Secondary | ICD-10-CM

## 2018-08-09 DIAGNOSIS — G8929 Other chronic pain: Secondary | ICD-10-CM | POA: Diagnosis present

## 2018-08-09 DIAGNOSIS — Z79890 Hormone replacement therapy (postmenopausal): Secondary | ICD-10-CM

## 2018-08-09 DIAGNOSIS — K0530 Chronic periodontitis, unspecified: Secondary | ICD-10-CM | POA: Diagnosis not present

## 2018-08-09 DIAGNOSIS — M069 Rheumatoid arthritis, unspecified: Secondary | ICD-10-CM | POA: Diagnosis not present

## 2018-08-09 DIAGNOSIS — E1122 Type 2 diabetes mellitus with diabetic chronic kidney disease: Secondary | ICD-10-CM | POA: Diagnosis not present

## 2018-08-09 DIAGNOSIS — K089 Disorder of teeth and supporting structures, unspecified: Secondary | ICD-10-CM

## 2018-08-09 DIAGNOSIS — E119 Type 2 diabetes mellitus without complications: Secondary | ICD-10-CM | POA: Diagnosis not present

## 2018-08-09 DIAGNOSIS — I472 Ventricular tachycardia: Secondary | ICD-10-CM | POA: Diagnosis not present

## 2018-08-09 DIAGNOSIS — Z6831 Body mass index (BMI) 31.0-31.9, adult: Secondary | ICD-10-CM

## 2018-08-09 DIAGNOSIS — K029 Dental caries, unspecified: Secondary | ICD-10-CM | POA: Diagnosis present

## 2018-08-09 DIAGNOSIS — H409 Unspecified glaucoma: Secondary | ICD-10-CM | POA: Diagnosis not present

## 2018-08-09 DIAGNOSIS — R0902 Hypoxemia: Secondary | ICD-10-CM | POA: Diagnosis not present

## 2018-08-09 DIAGNOSIS — E785 Hyperlipidemia, unspecified: Secondary | ICD-10-CM | POA: Diagnosis not present

## 2018-08-09 DIAGNOSIS — N183 Chronic kidney disease, stage 3 unspecified: Secondary | ICD-10-CM

## 2018-08-09 DIAGNOSIS — J209 Acute bronchitis, unspecified: Secondary | ICD-10-CM

## 2018-08-09 DIAGNOSIS — Z7984 Long term (current) use of oral hypoglycemic drugs: Secondary | ICD-10-CM | POA: Diagnosis not present

## 2018-08-09 DIAGNOSIS — E0800 Diabetes mellitus due to underlying condition with hyperosmolarity without nonketotic hyperglycemic-hyperosmolar coma (NKHHC): Secondary | ICD-10-CM

## 2018-08-09 DIAGNOSIS — F419 Anxiety disorder, unspecified: Secondary | ICD-10-CM | POA: Diagnosis not present

## 2018-08-09 DIAGNOSIS — E039 Hypothyroidism, unspecified: Secondary | ICD-10-CM | POA: Diagnosis present

## 2018-08-09 DIAGNOSIS — Z833 Family history of diabetes mellitus: Secondary | ICD-10-CM

## 2018-08-09 DIAGNOSIS — I5032 Chronic diastolic (congestive) heart failure: Secondary | ICD-10-CM | POA: Diagnosis not present

## 2018-08-09 DIAGNOSIS — Z83511 Family history of glaucoma: Secondary | ICD-10-CM

## 2018-08-09 DIAGNOSIS — I2510 Atherosclerotic heart disease of native coronary artery without angina pectoris: Secondary | ICD-10-CM | POA: Diagnosis present

## 2018-08-09 DIAGNOSIS — Z79899 Other long term (current) drug therapy: Secondary | ICD-10-CM

## 2018-08-09 DIAGNOSIS — R0600 Dyspnea, unspecified: Secondary | ICD-10-CM | POA: Diagnosis present

## 2018-08-09 DIAGNOSIS — N179 Acute kidney failure, unspecified: Secondary | ICD-10-CM | POA: Diagnosis not present

## 2018-08-09 DIAGNOSIS — Z8711 Personal history of peptic ulcer disease: Secondary | ICD-10-CM

## 2018-08-09 DIAGNOSIS — I350 Nonrheumatic aortic (valve) stenosis: Secondary | ICD-10-CM | POA: Diagnosis not present

## 2018-08-09 DIAGNOSIS — I5033 Acute on chronic diastolic (congestive) heart failure: Secondary | ICD-10-CM

## 2018-08-09 DIAGNOSIS — E669 Obesity, unspecified: Secondary | ICD-10-CM | POA: Diagnosis present

## 2018-08-09 DIAGNOSIS — R0602 Shortness of breath: Secondary | ICD-10-CM | POA: Diagnosis not present

## 2018-08-09 DIAGNOSIS — D500 Iron deficiency anemia secondary to blood loss (chronic): Secondary | ICD-10-CM | POA: Diagnosis not present

## 2018-08-09 DIAGNOSIS — I10 Essential (primary) hypertension: Secondary | ICD-10-CM

## 2018-08-09 DIAGNOSIS — B888 Other specified infestations: Secondary | ICD-10-CM | POA: Diagnosis present

## 2018-08-09 DIAGNOSIS — I130 Hypertensive heart and chronic kidney disease with heart failure and stage 1 through stage 4 chronic kidney disease, or unspecified chronic kidney disease: Secondary | ICD-10-CM | POA: Diagnosis not present

## 2018-08-09 DIAGNOSIS — J42 Unspecified chronic bronchitis: Secondary | ICD-10-CM | POA: Diagnosis present

## 2018-08-09 DIAGNOSIS — Z85820 Personal history of malignant melanoma of skin: Secondary | ICD-10-CM

## 2018-08-09 DIAGNOSIS — Z8249 Family history of ischemic heart disease and other diseases of the circulatory system: Secondary | ICD-10-CM

## 2018-08-09 DIAGNOSIS — R062 Wheezing: Secondary | ICD-10-CM

## 2018-08-09 DIAGNOSIS — M810 Age-related osteoporosis without current pathological fracture: Secondary | ICD-10-CM | POA: Diagnosis not present

## 2018-08-09 DIAGNOSIS — I160 Hypertensive urgency: Secondary | ICD-10-CM | POA: Diagnosis not present

## 2018-08-09 DIAGNOSIS — J810 Acute pulmonary edema: Secondary | ICD-10-CM | POA: Diagnosis not present

## 2018-08-09 HISTORY — DX: Anemia, unspecified: D64.9

## 2018-08-09 HISTORY — DX: Heart failure, unspecified: I50.9

## 2018-08-09 HISTORY — DX: Anxiety disorder, unspecified: F41.9

## 2018-08-09 HISTORY — DX: Depression, unspecified: F32.A

## 2018-08-09 HISTORY — DX: Chronic kidney disease, stage 3 (moderate): N18.3

## 2018-08-09 HISTORY — DX: Major depressive disorder, single episode, unspecified: F32.9

## 2018-08-09 LAB — COMPREHENSIVE METABOLIC PANEL
ALK PHOS: 69 U/L (ref 38–126)
ALT: 16 U/L (ref 0–44)
AST: 20 U/L (ref 15–41)
Albumin: 2.9 g/dL — ABNORMAL LOW (ref 3.5–5.0)
Anion gap: 9 (ref 5–15)
BILIRUBIN TOTAL: 0.6 mg/dL (ref 0.3–1.2)
BUN: 18 mg/dL (ref 8–23)
CALCIUM: 7.8 mg/dL — AB (ref 8.9–10.3)
CO2: 25 mmol/L (ref 22–32)
CREATININE: 1.46 mg/dL — AB (ref 0.44–1.00)
Chloride: 105 mmol/L (ref 98–111)
GFR, EST AFRICAN AMERICAN: 40 mL/min — AB (ref >60)
GFR, EST NON AFRICAN AMERICAN: 35 mL/min — AB (ref >60)
Glucose, Bld: 181 mg/dL — ABNORMAL HIGH (ref 70–99)
Potassium: 3.7 mmol/L (ref 3.5–5.1)
SODIUM: 139 mmol/L (ref 135–145)
Total Protein: 5.6 g/dL — ABNORMAL LOW (ref 6.5–8.1)

## 2018-08-09 LAB — CBC WITH DIFFERENTIAL/PLATELET
ABS IMMATURE GRANULOCYTES: 0.1 10*3/uL (ref 0.0–0.1)
BASOS ABS: 0.1 10*3/uL (ref 0.0–0.1)
Basophils Relative: 1 %
Eosinophils Absolute: 0.2 10*3/uL (ref 0.0–0.7)
Eosinophils Relative: 2 %
HCT: 32.6 % — ABNORMAL LOW (ref 36.0–46.0)
HEMOGLOBIN: 10.7 g/dL — AB (ref 12.0–15.0)
Immature Granulocytes: 0 %
Lymphocytes Relative: 17 %
Lymphs Abs: 2 10*3/uL (ref 0.7–4.0)
MCH: 30.2 pg (ref 26.0–34.0)
MCHC: 32.8 g/dL (ref 30.0–36.0)
MCV: 92.1 fL (ref 78.0–100.0)
MONO ABS: 0.6 10*3/uL (ref 0.1–1.0)
Monocytes Relative: 6 %
NEUTROS ABS: 8.6 10*3/uL — AB (ref 1.7–7.7)
Neutrophils Relative %: 74 %
Platelets: 159 10*3/uL (ref 150–400)
RBC: 3.54 MIL/uL — ABNORMAL LOW (ref 3.87–5.11)
RDW: 13.6 % (ref 11.5–15.5)
WBC: 11.7 10*3/uL — ABNORMAL HIGH (ref 4.0–10.5)

## 2018-08-09 LAB — GLUCOSE, CAPILLARY: GLUCOSE-CAPILLARY: 104 mg/dL — AB (ref 70–99)

## 2018-08-09 LAB — TROPONIN I
Troponin I: 0.14 ng/mL (ref <0.03)
Troponin I: 0.11 ng/mL (ref <0.03)

## 2018-08-09 LAB — HEMOGLOBIN A1C
HEMOGLOBIN A1C: 7.8 % — AB (ref 4.8–5.6)
Mean Plasma Glucose: 177.16 mg/dL

## 2018-08-09 LAB — BRAIN NATRIURETIC PEPTIDE: B NATRIURETIC PEPTIDE 5: 489.1 pg/mL — AB (ref 0.0–100.0)

## 2018-08-09 LAB — TSH: TSH: 116.22 u[IU]/mL — ABNORMAL HIGH (ref 0.350–4.500)

## 2018-08-09 MED ORDER — TIMOLOL MALEATE 0.5 % OP SOLN
1.0000 [drp] | Freq: Two times a day (BID) | OPHTHALMIC | Status: DC
  Administered 2018-08-09 – 2018-08-18 (×18): 1 [drp] via OPHTHALMIC
  Filled 2018-08-09: qty 5

## 2018-08-09 MED ORDER — LEVOTHYROXINE SODIUM 88 MCG PO TABS
88.0000 ug | ORAL_TABLET | Freq: Every day | ORAL | Status: DC
  Administered 2018-08-10 – 2018-08-18 (×9): 88 ug via ORAL
  Filled 2018-08-09 (×9): qty 1

## 2018-08-09 MED ORDER — ASPIRIN EC 81 MG PO TBEC
81.0000 mg | DELAYED_RELEASE_TABLET | Freq: Every day | ORAL | Status: DC
  Administered 2018-08-12 – 2018-08-18 (×6): 81 mg via ORAL
  Filled 2018-08-09 (×6): qty 1

## 2018-08-09 MED ORDER — SODIUM CHLORIDE 0.9% FLUSH: 3.0000 mL | INTRAVENOUS | Status: DC | PRN

## 2018-08-09 MED ORDER — SODIUM CHLORIDE 0.9% FLUSH
3.0000 mL | Freq: Two times a day (BID) | INTRAVENOUS | Status: DC
  Administered 2018-08-09 – 2018-08-18 (×16): 3 mL via INTRAVENOUS

## 2018-08-09 MED ORDER — SODIUM CHLORIDE 0.9 % IV SOLN: 250.0000 mL | INTRAVENOUS | Status: DC | PRN

## 2018-08-09 MED ORDER — AMLODIPINE BESYLATE 10 MG PO TABS: 10.0000 mg | ORAL_TABLET | Freq: Every day | ORAL | Status: DC

## 2018-08-09 MED ORDER — SODIUM CHLORIDE 0.9 % IV SOLN: INTRAVENOUS | Status: AC

## 2018-08-09 MED ORDER — GABAPENTIN 300 MG PO CAPS: 600.0000 mg | ORAL_CAPSULE | Freq: Three times a day (TID) | ORAL | Status: DC

## 2018-08-09 MED ORDER — HEPARIN SODIUM (PORCINE) 5000 UNIT/ML IJ SOLN
5000.0000 [IU] | Freq: Three times a day (TID) | INTRAMUSCULAR | Status: DC
  Administered 2018-08-09 – 2018-08-11 (×7): 5000 [IU] via SUBCUTANEOUS
  Filled 2018-08-09 (×7): qty 1

## 2018-08-09 MED ORDER — ACETAMINOPHEN 325 MG PO TABS
650.0000 mg | ORAL_TABLET | ORAL | Status: DC | PRN
  Administered 2018-08-11 – 2018-08-16 (×4): 650 mg via ORAL
  Filled 2018-08-09 (×4): qty 2

## 2018-08-09 MED ORDER — ALBUTEROL SULFATE (2.5 MG/3ML) 0.083% IN NEBU
3.0000 mL | INHALATION_SOLUTION | RESPIRATORY_TRACT | Status: DC | PRN
  Administered 2018-08-13 – 2018-08-16 (×5): 3 mL via RESPIRATORY_TRACT
  Filled 2018-08-09 (×5): qty 3

## 2018-08-09 MED ORDER — LATANOPROST 0.005 % OP SOLN
1.0000 [drp] | Freq: Every day | OPHTHALMIC | Status: DC
  Administered 2018-08-09 – 2018-08-17 (×9): 1 [drp] via OPHTHALMIC
  Filled 2018-08-09: qty 2.5

## 2018-08-09 MED ORDER — SIMVASTATIN 20 MG PO TABS
20.0000 mg | ORAL_TABLET | Freq: Every day | ORAL | Status: DC
  Administered 2018-08-09 – 2018-08-17 (×9): 20 mg via ORAL
  Filled 2018-08-09 (×9): qty 1

## 2018-08-09 MED ORDER — AMLODIPINE BESYLATE 5 MG PO TABS
5.0000 mg | ORAL_TABLET | Freq: Every day | ORAL | Status: DC
  Administered 2018-08-09 – 2018-08-17 (×9): 5 mg via ORAL
  Filled 2018-08-09 (×9): qty 1

## 2018-08-09 MED ORDER — ONDANSETRON HCL 4 MG/2ML IJ SOLN
4.0000 mg | Freq: Four times a day (QID) | INTRAMUSCULAR | Status: DC | PRN
  Administered 2018-08-12 – 2018-08-14 (×2): 4 mg via INTRAVENOUS
  Filled 2018-08-09 (×2): qty 2

## 2018-08-09 MED ORDER — GABAPENTIN 300 MG PO CAPS
300.0000 mg | ORAL_CAPSULE | Freq: Two times a day (BID) | ORAL | Status: DC
  Administered 2018-08-09 – 2018-08-18 (×18): 300 mg via ORAL
  Filled 2018-08-09 (×18): qty 1

## 2018-08-09 MED ORDER — ASPIRIN 81 MG PO CHEW
81.0000 mg | CHEWABLE_TABLET | ORAL | Status: AC
  Administered 2018-08-10: 81 mg via ORAL
  Filled 2018-08-09: qty 1

## 2018-08-09 MED ORDER — NITROGLYCERIN 0.4 MG SL SUBL: 0.4000 mg | SUBLINGUAL_TABLET | SUBLINGUAL | Status: DC | PRN

## 2018-08-09 NOTE — H&P (Addendum)
History & Physical    Patient ID: [name] [name] MRN: [id number], DOB/AGE: May 07, 1946   Admit date: 08/09/2018  Primary Physician: Redmond School, MD Primary Cardiologist: Dr. Pennie Rushing   Patient Profile    72 yo female with history of hypertension, HLD, hypothyroidism, diabetes, chronic bronchitis, rheumatoid arthritis and severe aortic stenosis who was initially referred to the valve clinic by Dr. Bronson Ing for further discussion regarding her aortic stenosis and possible TAVR.   Past Medical History   Past Medical History:  Diagnosis Date  . Aortic stenosis   . Arthritis   . Blood transfusion without reported diagnosis   . Bronchitis   . Diabetes mellitus   . Diverticulitis   . Glaucoma   . Hyperlipidemia   . Hypertension   . Hypothyroidism   . Macular degeneration   . Melanoma ([place])    left thigh, 2010  . Osteoarthritis   . Peptic ulcer   . Rheumatoid arthritis(714.0)   . Thyroid disease     Past Surgical History:  Procedure Laterality Date  . ABDOMINAL HYSTERECTOMY  1988  . APPENDECTOMY    . BACK SURGERY  07/2008   fusion  . BACK SURGERY  05/3892   complicated by post-op anemia requiring transfusion  . BREAST SURGERY     right breast biopsy  . CATARACT EXTRACTION    . CHOLECYSTECTOMY  1974  . COLONOSCOPY  06/06/2007   Proximal diminutive rectal polyps cold biopsied/removed, otherwise normal rectum/ Left-sided diverticula, the remainder of the colonic mucosa appeared normal. Path-tubular adnemoa.   . COLONOSCOPY  09/14/2012   TDS:KAJGOT polyps-removed as described  above Colonic diverticulosis. Poor prep. next TCS five years.   . COLONOSCOPY N/A 10/29/2016   Procedure: COLONOSCOPY;  Surgeon: Daneil Dolin, MD;  [facility]: [facility];  Service: Endoscopy;  Laterality: N/A;  2:00 pm  . ESOPHAGOGASTRODUODENOSCOPY  09/2012   Rourk: gastric ulcer, gastric duodenal erosions, small hh. benign bx  . ESOPHAGOGASTRODUODENOSCOPY N/A  10/29/2016   Procedure: ESOPHAGOGASTRODUODENOSCOPY (EGD);  Surgeon: Daneil Dolin, MD;  Location: [facility];  Service: Endoscopy;  Laterality: N/A;  . EYE SURGERY    . FOOT SURGERY  1994   left   . LUMBAR SPINE SURGERY  08/04/2014   dr Joya Salm  . macular degen    . MALONEY DILATION N/A 10/29/2016   Procedure: Venia Minks DILATION;  Surgeon: Daneil Dolin, MD;  Location: [facility];  Service: Endoscopy;  Laterality: N/A;  . MELANOMA EXCISION  2010  . YAG LASER APPLICATION     OS    Allergies No Known Allergies  History of Present Illness    Patient was last seen by Dr. Bronson Ing on 07/20/2018 in follow-up for aortic stenosis and hypertension. She had recently undergone an echocardiogram on 07/13/2018 which demonstrated normal LV systolic function and normal regional wall motion, LVEF 50 to 55%, mild to moderate LVH, grade 1 diastolic dysfunction and possibly severe, low gradient aortic stenosis (mean gradient 20 mmHg), valve area 0.53 cm by VTI, mild aortic regurgitation, mild mitral regurgitation, and mild tricuspid regurgitation with normal pulmonary pressures.  Despite her chronic lung disease, she had complaints of progressive dyspnea and "passing out" episodes and was therefore referred to Dr. Angelena Form for possible TAVR work-up. She was then seen by Dr. Angelena Form on 08/04/2017 with plans to proceed with TAVR procedure, starting with a right and left heart catheterization scheduled for 08/16/2018.   Unfortunately on 08/08/2018 patient presented to [facility] with acutely worsening dyspnea  which began while getting ready for bed on 08/08/18. Pt states that her symptoms had began getting worse several days ago and reports having to take her Albuterol approximately 6 times yesterday with only temporary relief. She states that she normally does not take her Albuterol on a daily basis. She denies chest pain, palpitations, dizziness, syncope, LE swelling, orthopnea, PND.  Patient has known  balance issues and walks with a cane at baseline.  She has no known history of CAD.  She denies tobacco, alcohol or illicit drug use.  She has known poor dentition and was scheduled to see a dentist for pro-op consultation today, 08/09/18. She was transferred to [facility] on 08/09/18 for further workup given her acute symptoms and scheduled procedure next week.   Home Medications    Prior to Admission medications   Medication Sig Start Date End Date Taking? Authorizing Provider  acetaminophen (TYLENOL 8 HOUR ARTHRITIS PAIN) 650 MG CR tablet Take 1,300 mg by mouth every 8 (eight) hours as needed for pain.    [provider]  ALPRAZolam Duanne Moron) 0.5 MG tablet Take 0.5 mg by mouth at bedtime as needed for sleep.     [provider]  amLODipine (NORVASC) 10 MG tablet Take 1 tablet (10 mg total) by mouth daily. Patient taking differently: Take 5 mg by mouth at bedtime.  07/20/18 10/18/18  Herminio Commons, MD  aspirin EC 81 MG tablet Take 81 mg by mouth daily.     [provider]  beta carotene w/minerals (OCUVITE) tablet Take 1 tablet by mouth daily.    [provider]  Cholecalciferol ([employer] VIT D 5000 HIGH-POTENCY) 5000 UNITS capsule Take 5,000 Units by mouth daily.    [provider]  cimetidine (TAGAMET) 200 MG tablet Take 200 mg by mouth daily as needed (acid reflux/indigestion.).     [provider]  Cinnamon 500 MG capsule Take 500 mg by mouth daily.    [provider]  enalapril (VASOTEC) 20 MG tablet Take 1 tablet (20 mg total) by mouth 2 (two) times daily. 01/07/17   Herminio Commons, MD  ferrous sulfate 325 (65 FE) MG tablet Take 325 mg by mouth daily with breakfast.    [provider]  gabapentin (NEURONTIN) 300 MG capsule Take 600 mg by mouth 3 (three) times daily.     [provider]  glipiZIDE (GLUCOTROL) 10 MG tablet Take 10 mg by mouth daily.     [provider]  HYDROcodone-acetaminophen (NORCO)  10-325 MG tablet Take 1 tablet by mouth every 8 (eight) hours as needed for moderate pain.     [provider]  latanoprost (XALATAN) 0.005 % ophthalmic solution Place 1 drop into both eyes at bedtime.     [provider]  levothyroxine (SYNTHROID, LEVOTHROID) 88 MCG tablet Take 88 mcg by mouth daily before breakfast.    [provider]  Liniments (SALONPAS PAIN RELIEF PATCH EX) Apply 1 patch topically every 12 (twelve) hours as needed (pain.).    [provider]  metFORMIN (GLUCOPHAGE) 1000 MG tablet Take 1,000 mg by mouth 2 (two) times daily with a meal.     [provider]  Omega-3 Fatty Acids (FISH OIL) 1000 MG CAPS Take 1,000 mg by mouth daily.     [provider]  potassium chloride SA (K-DUR,KLOR-CON) 20 MEQ tablet Take 20 mEq by mouth daily.     [provider]  Prenatal Vit-Fe Fumarate-FA (PRENATAL MULTIVITAMIN) TABS Take 1 tablet by mouth  daily.    [provider]  simvastatin (ZOCOR) 20 MG tablet Take 20 mg by mouth at bedtime.      [provider]  timolol (TIMOPTIC) 0.5 % ophthalmic solution Place 1 drop into both eyes 2 (two) times daily.     [provider]  VENTOLIN HFA 108 (90 Base) MCG/ACT inhaler Inhale 2 puffs into the lungs every 4 (four) hours as needed for wheezing or shortness of breath.  06/22/18   [provider]  vitamin C (ASCORBIC ACID) 500 MG tablet Take 500 mg by mouth daily.    [provider]  vitamin E 400 UNIT capsule Take 400 Units by mouth daily.    [provider]   Family History    Family History  Problem Relation Age of Onset  . Colon cancer Brother        diagnosed at age 38  . Diabetes Brother   . Hypertension Mother   . Heart failure Mother   . Diabetes Mother   . Glaucoma Mother   . Hypertension Other   . Other Other   . Heart failure Father        passed away of MI in his 39s  . Diabetes Sister   . Hypertension Sister   .  Breast cancer Sister   . Heart attack Brother   . Diabetes Sister   . Hypertension Sister   . Other Sister        tumor  . Hypertension Sister   . Hypertension Daughter   . Bipolar disorder Daughter   . Anesthesia problems Neg Hx   . Amblyopia Neg Hx   . Blindness Neg Hx   . Cataracts Neg Hx   . Macular degeneration Neg Hx   . Retinal detachment Neg Hx   . Strabismus Neg Hx   . Retinitis pigmentosa Neg Hx    Social History    Social History   Socioeconomic History  . Marital status: Widowed    Spouse name: Not on file  . Number of children: 2  . Years of education: Not on file  . Highest education level: Not on file  Occupational History  . Not on file  Social Needs  . Financial resource strain: Not on file  . Food insecurity:    Worry: Not on file    Inability: Not on file  . Transportation needs:    Medical: Not on file    Non-medical: Not on file  Tobacco Use  . Smoking status: Never Smoker  . Smokeless tobacco: Never Used  Substance and Sexual Activity  . Alcohol use: No    Comment: "Wine a couple times a month"  . Drug use: No  . Sexual activity: Yes    Birth control/protection: Surgical  Lifestyle  . Physical activity:    Days per week: Not on file    Minutes per session: Not on file  . Stress: Not on file  Relationships  . Social connections:    Talks on phone: Not on file    Gets together: Not on file    Attends religious service: Not on file    Active member of club or organization: Not on file    Attends meetings of clubs or organizations: Not on file    Relationship status: Not on file  . Intimate partner violence:    Fear of current or ex partner: Not on file    Emotionally abused: Not on file    Physically abused:  Not on file    Forced sexual activity: Not on file  Other Topics Concern  . Not on file  Social History Narrative   Husband deceased 04-18-2011.      Review of Systems   See HPI All other systems reviewed and are otherwise  negative except as noted above.  Physical Exam    Blood pressure (!) 175/74, pulse 64, resp. rate 19, SpO2 100 %.   General: Elderly, older than stated age, NAD Skin: Warm, dry, intact  Head: Normocephalic, atraumatic, clear, moist mucus membranes. Neck: Negative for carotid bruits. No JVD Lungs:Crackles in bilateral bases. No wheezes, rales, or rhonchi. Breathing is unlabored. Cardiovascular: RRR with S1 S2. + murmur. No rubs, gallops, or LV heave appreciated. Abdomen: Soft, non-tender, non-distended with normoactive bowel sounds. No obvious abdominal masses. MSK: Strength and tone appear normal for age. 5/5 in all extremities Extremities: No edema. No clubbing or cyanosis. DP/PT pulses 2+ bilaterally Neuro: Alert and oriented. No focal deficits. No facial asymmetry. MAE spontaneously. Psych: Responds to questions appropriately with normal affect.    Labs    Troponin (Point of Care Test) No results for input(s): TROPIPOC in the last 72 hours. No results for input(s): CKTOTAL, CKMB, TROPONINI in the last 72 hours. Lab Results  Component Value Date   WBC 10.5 08/04/2018   HGB 13.0 08/04/2018   HCT 38.6 08/04/2018   MCV 87 08/04/2018   PLT 284 08/04/2018    Recent Labs  Lab 08/04/18 1658  NA 139  K 3.8  CL 98  CO2 23  BUN 21  CREATININE 1.35*  CALCIUM 10.3  GLUCOSE 194*   No results found for: CHOL, HDL, LDLCALC, TRIG No results found for: [facility]   Radiology Studies    No results found.  ECG & Cardiac Imaging    Echo 07/13/18: Left ventricle: The cavity size was normal. Wall thickness was increased increased in a pattern of mild to moderate LVH. Systolic function was normal. The estimated ejection fraction was in the range of 50% to 55%. Wall motion was normal; there were no regional wall motion abnormalities. Doppler parameters are consistent with abnormal left ventricular relaxation (grade 1 diastolic dysfunction). - Aortic valve: Mildly to  moderately calcified annulus. Probably trileaflet; moderately calcified leaflets. There was mild regurgitation. Mean gradient (S): 20 mm Hg. Peak gradient (S): 33 mm Hg. VTI ratio of LVOT to aortic valve: 0.19. Possible severe, low-gradient aortic stenosis. Valve area (VTI): 0.53 cm^2. Valve area (Vmax): 0.62 cm^2. Valve area (Vmean): 0.54 cm^2. - Mitral valve: Mildly calcified leaflets. There was mild regurgitation. - Left atrium: The atrium was at the upper limits of normal in size. - Right atrium: Central venous pressure (est): 3 mm Hg. - Atrial septum: No defect or patent foramen ovale was identified. - Tricuspid valve: There was mild regurgitation. - Pulmonary arteries: PA peak pressure: 15 mm Hg (S). - Pericardium, extracardiac: There was no pericardial effusion. ------------------------------------------------------------------- Left ventricle: The cavity size was normal. Wall thickness was increased increased in a pattern of mild to moderate LVH. Systolic function was normal. The estimated ejection fraction was in the range of 50% to 55%. Wall motion was normal; there were no regional wall motion abnormalities. Doppler parameters are consistent with abnormal left ventricular relaxation (grade 1 diastolic dysfunction).  ------------------------------------------------------------------- Aortic valve: Mildly to moderately calcified annulus. Probably trileaflet; moderately calcified leaflets. Possible severe, low-gradient aortic stenosis. Doppler: There was mild regurgitation. VTI ratio of LVOT to aortic valve: 0.19. Valve area (VTI): 0.53  cm^2. Indexed valve area (VTI): 0.33 cm^2/m^2. Peak velocity ratio of LVOT to aortic valve: 0.22. Valve area (Vmax): 0.62 cm^2. Indexed valve area (Vmax): 0.39 cm^2/m^2. Mean velocity ratio of LVOT to aortic valve: 0.19. Valve area (Vmean): 0.54 cm^2. Indexed valve area (Vmean): 0.34 cm^2/m^2. Mean gradient (S): 20  mm Hg. Peak gradient (S): 33 mm Hg.  ------------------------------------------------------------------- Aorta: Aortic root: The aortic root was normal in size.  ------------------------------------------------------------------- Mitral valve: Mildly calcified leaflets. Doppler: There was mild regurgitation. Peak gradient (D): 3 mm Hg.  ------------------------------------------------------------------- Left atrium: The atrium was at the upper limits of normal in size.  ------------------------------------------------------------------- Atrial septum: No defect or patent foramen ovale was identified.  ------------------------------------------------------------------- Right ventricle: The cavity size was normal. Systolic function was normal.  ------------------------------------------------------------------- Pulmonic valve: The valve appears to be grossly normal. Doppler: There was trivial regurgitation.  ------------------------------------------------------------------- Tricuspid valve: The valve appears to be grossly normal. Doppler: There was mild regurgitation.  ------------------------------------------------------------------- Right atrium: The atrium was normal in size.  ------------------------------------------------------------------- Pericardium: There was no pericardial effusion.  ------------------------------------------------------------------- Systemic veins: Inferior vena cava: The vessel was normal in size. The respirophasic diameter changes were in the normal range (>= 50%), consistent with normal central venous pressure.  ------------------------------------------------------------------- Measurements  Left ventricle Value Reference LV ID, ED, PLAX chordal 48.2 mm 43 - 52 LV ID, ES, PLAX chordal 35.2 mm 23 - 38 LV fx shortening, PLAX  chordal (L) 27 % >=29 LV PW thickness, ED 14.7 mm ---------- IVS/LV PW ratio, ED 0.76 <=1.3 Stroke volume, 2D 38 ml ---------- Stroke volume/bsa, 2D 24 ml/m^2 ---------- LV ejection fraction, 1-p A4C 57 % ---------- LV end-diastolic volume, 2-p 60 ml ---------- LV end-systolic volume, 2-p 29 ml ---------- LV ejection fraction, 2-p 52 % ---------- Stroke volume, 2-p 31 ml ---------- LV end-diastolic volume/bsa, 2-p 38 ml/m^2 ---------- LV end-systolic volume/bsa, 2-p 18 ml/m^2 ---------- Stroke volume/bsa, 2-p 19.4 ml/m^2 ---------- LV e&', lateral 4.18 cm/s ---------- LV E/e&', lateral 20.07 ---------- LV e&', medial 2.23 cm/s ---------- LV E/e&', medial 37.62 ---------- LV e&', average 3.21 cm/s ---------- LV E/e&', average 26.18 ----------  Ventricular septum Value Reference IVS thickness, ED 11.1 mm ----------  LVOT Value Reference LVOT ID, S 19 mm ---------- LVOT area 2.84 cm^2 ---------- LVOT peak velocity, S 61.9 cm/s ---------- LVOT mean velocity, S 40.8 cm/s ---------- LVOT VTI, S 12 cm ----------  Aortic valve Value Reference Aortic valve peak velocity, S  285 cm/s ---------- Aortic valve mean velocity, S 213 cm/s ---------- Aortic valve VTI, S 64.8 cm ---------- Aortic mean gradient, S 20 mm Hg ---------- Aortic peak gradient, S 33 mm Hg ---------- VTI ratio, LVOT/AV 0.19 ---------- Aortic valve area, VTI 0.53 cm^2 ---------- Aortic valve area/bsa, VTI 0.33 cm^2/m^2 ---------- Velocity ratio, peak, LVOT/AV 0.22 ---------- Aortic valve area, peak velocity 0.62 cm^2 ---------- Aortic valve area/bsa, peak 0.39 cm^2/m^2 ---------- velocity Velocity ratio, mean, LVOT/AV 0.19 ---------- Aortic valve area, mean velocity 0.54 cm^2 ---------- Aortic valve area/bsa, mean 0.34 cm^2/m^2 ---------- velocity Aortic regurg pressure half-time 573 ms ----------  Aorta Value Reference Aortic root ID, ED 32 mm ----------  Left atrium Value Reference LA ID, A-P, ES 31 mm ---------- LA ID/bsa, A-P 1.96 cm/m^2 <=2.2 LA volume, S 42 ml ---------- LA volume/bsa, S 26.5 ml/m^2 ---------- LA volume, ES, 1-p A4C 43.2 ml ---------- LA volume/bsa, ES, 1-p A4C 27.2 ml/m^2 ---------- LA volume, ES, 1-p A2C 38.2 ml ---------- LA volume/bsa, ES, 1-p A2C 24.1 ml/m^2 ----------  Mitral valve Value Reference  Mitral E-wave peak velocity 83.9 cm/s ---------- Mitral A-wave peak  velocity 137 cm/s ---------- Mitral deceleration time (H) 330 ms 150 - 230 Mitral peak gradient, D 3 mm Hg ---------- Mitral E/A ratio, peak 0.6 ---------- Mitral maximal regurg velocity, 519 cm/s ---------- PISA  Pulmonary arteries Value Reference PA pressure, S, DP 15 mm Hg <=30  Tricuspid valve Value Reference Tricuspid regurg peak velocity 172 cm/s ---------- Tricuspid peak RV-RA gradient 12 mm Hg ----------  Right atrium Value Reference RA ID, S-I, ES, A4C 40.3 mm 34 - 49 RA area, ES, A4C (L) 8.07 cm^2 8.3 - 19.5 RA volume, ES, A/L 12.9 ml ---------- RA volume/bsa, ES, A/L 8.1 ml/m^2 ----------  Systemic veins Value Reference Estimated CVP 3 mm Hg ----------  Right ventricle Value Reference TAPSE 18.2 mm ---------- RV pressure, S, DP 15 mm Hg <=30 RV s&', lateral, S 10.4 cm/s ----------  Assessment & Plan    1.  Severe aortic stenosis: -Patient was initially referred to Dr. Angelena Form on 08/04/2018 for symptomatic severe aortic stenosis for possible TAVR from Dr. Bronson Ing.  Case reviewed with plans to proceed with TAVR.  Preprocedural right and left heart catheterization currently scheduled for 08/16/18. Procedure was initially scheduled for 08/11/2018, however there is concern for bed bug infestation and procedure was then rescheuled for a later date.  -She unfortunatley presented to [facility] on  08/08/18 with worsening symptoms and was then transferred to [facility] for further evaluation and plans to proceed with R/L cath and TAVR procedures based on clinical response.  -Currently on NTG gtt from [facility] -Will order baseline labs including BMET, CBC, BNP, CXR, TSH, lipid panel, and HbA1c -Pre-procedural dental referral while inpatient?   2.  Suspected bedbug infestation: -Patient with bedbugs found during office pre-surgical consultation -Instructions given for patient to shower and place clean closed prior to leaving for the hospital day for cath -I do not see any active infestation during my evaluation  -Placed on strict contact precautions   3. HTN: -Elevated, 175/74 -Continue home dose amlodipine 10mg  daily -Will hold enalapril until renal function reassessed and possible procedure    4. DM2: -Blood glucose noted to be elevated on 08/04/18 -Will obtain HbA1c -Will start SSI for glucose control while inpatient  -On home glipizide, metformin>>hold pending procedure   5. 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 HLD: -Will obtain lipid panel -On home dose simvastatin 20mg  PO daily   7. Hypothyroidism: -Will obtain TSH while inpatient  -Continue synthroid 59mcg Po daily   8. AKI: -Last creatinine, 1.35 on 08/04/18 for pre-surgical labs -Baseline appears to be in the 0.5 range  Severity of Illness: The appropriate patient status for this patient is OBSERVATION. Observation status is judged to be reasonable and necessary in order to provide the required intensity of service to ensure the patient's safety. The patient's presenting symptoms, physical exam findings, and initial radiographic and laboratory data in the context of their medical condition is felt to place them at decreased risk for further clinical deterioration. Furthermore, it is anticipated that the patient will be medically stable for discharge from the hospital  within 2 midnights of admission. The following factors support the patient status of observation.   " The patient's presenting symptoms include dyspnea. " The physical exam findings include diminished lung fields  " The initial radiographic and laboratory data are none as of 08/09/18    Signed, Kathyrn Drown NP-C [place] Pager: [phone] 08/09/2018, @NOW   Pateint seen and examined   I agree with findings as noted above by Tonny Branch Pt is a 72 yo with history of severe aortic stenosis   SHe is followed by Estevan Ryder   Seen in clinic 1 wk ago     Presented today to [facility] with severe SOB and severe HTN    Improved with IV lasix and NTG    Transferred to [place] for further care  Currently her breathing is OK   She denies CP ON exam:   Lungs Rales at R base  Cardiac exam:   RRR  III/VI systolic murmur LSB tp base   Abd is benign  Ext without edema  Pt has responded to IV lasix given with improvement in BP and breathing  Discussed with C McAlhany   Will plan of cardiac cath as part of TAVR work up   Timing-- assess in AM   Probably tomorrow  Dorris Carnes

## 2018-08-10 ENCOUNTER — Observation Stay (HOSPITAL_COMMUNITY): Payer: Medicare HMO

## 2018-08-10 ENCOUNTER — Encounter (HOSPITAL_COMMUNITY): Payer: Self-pay | Admitting: Dentistry

## 2018-08-10 DIAGNOSIS — M264 Malocclusion, unspecified: Secondary | ICD-10-CM

## 2018-08-10 DIAGNOSIS — I10 Essential (primary) hypertension: Secondary | ICD-10-CM | POA: Diagnosis not present

## 2018-08-10 DIAGNOSIS — E1122 Type 2 diabetes mellitus with diabetic chronic kidney disease: Secondary | ICD-10-CM | POA: Diagnosis present

## 2018-08-10 DIAGNOSIS — K029 Dental caries, unspecified: Secondary | ICD-10-CM | POA: Diagnosis not present

## 2018-08-10 DIAGNOSIS — M2753 Endodontic underfill: Secondary | ICD-10-CM | POA: Diagnosis not present

## 2018-08-10 DIAGNOSIS — E119 Type 2 diabetes mellitus without complications: Secondary | ICD-10-CM | POA: Diagnosis not present

## 2018-08-10 DIAGNOSIS — I130 Hypertensive heart and chronic kidney disease with heart failure and stage 1 through stage 4 chronic kidney disease, or unspecified chronic kidney disease: Secondary | ICD-10-CM | POA: Diagnosis not present

## 2018-08-10 DIAGNOSIS — K06010 Localized gingival recession, unspecified: Secondary | ICD-10-CM | POA: Diagnosis not present

## 2018-08-10 DIAGNOSIS — F419 Anxiety disorder, unspecified: Secondary | ICD-10-CM | POA: Diagnosis present

## 2018-08-10 DIAGNOSIS — N179 Acute kidney failure, unspecified: Secondary | ICD-10-CM | POA: Diagnosis not present

## 2018-08-10 DIAGNOSIS — K08409 Partial loss of teeth, unspecified cause, unspecified class: Secondary | ICD-10-CM

## 2018-08-10 DIAGNOSIS — J42 Unspecified chronic bronchitis: Secondary | ICD-10-CM | POA: Diagnosis not present

## 2018-08-10 DIAGNOSIS — Z23 Encounter for immunization: Secondary | ICD-10-CM | POA: Diagnosis not present

## 2018-08-10 DIAGNOSIS — E876 Hypokalemia: Secondary | ICD-10-CM | POA: Diagnosis not present

## 2018-08-10 DIAGNOSIS — M069 Rheumatoid arthritis, unspecified: Secondary | ICD-10-CM | POA: Diagnosis present

## 2018-08-10 DIAGNOSIS — G8929 Other chronic pain: Secondary | ICD-10-CM | POA: Diagnosis present

## 2018-08-10 DIAGNOSIS — I2510 Atherosclerotic heart disease of native coronary artery without angina pectoris: Secondary | ICD-10-CM | POA: Diagnosis present

## 2018-08-10 DIAGNOSIS — E785 Hyperlipidemia, unspecified: Secondary | ICD-10-CM | POA: Diagnosis not present

## 2018-08-10 DIAGNOSIS — E039 Hypothyroidism, unspecified: Secondary | ICD-10-CM | POA: Diagnosis present

## 2018-08-10 DIAGNOSIS — T380X5A Adverse effect of glucocorticoids and synthetic analogues, initial encounter: Secondary | ICD-10-CM | POA: Diagnosis not present

## 2018-08-10 DIAGNOSIS — R062 Wheezing: Secondary | ICD-10-CM | POA: Diagnosis not present

## 2018-08-10 DIAGNOSIS — I5032 Chronic diastolic (congestive) heart failure: Secondary | ICD-10-CM | POA: Diagnosis not present

## 2018-08-10 DIAGNOSIS — N183 Chronic kidney disease, stage 3 (moderate): Secondary | ICD-10-CM | POA: Diagnosis not present

## 2018-08-10 DIAGNOSIS — B888 Other specified infestations: Secondary | ICD-10-CM | POA: Diagnosis present

## 2018-08-10 DIAGNOSIS — K036 Deposits [accretions] on teeth: Secondary | ICD-10-CM | POA: Diagnosis not present

## 2018-08-10 DIAGNOSIS — K083 Retained dental root: Secondary | ICD-10-CM | POA: Diagnosis not present

## 2018-08-10 DIAGNOSIS — I5033 Acute on chronic diastolic (congestive) heart failure: Secondary | ICD-10-CM | POA: Diagnosis not present

## 2018-08-10 DIAGNOSIS — K0510 Chronic gingivitis, plaque induced: Secondary | ICD-10-CM | POA: Diagnosis not present

## 2018-08-10 DIAGNOSIS — I472 Ventricular tachycardia: Secondary | ICD-10-CM | POA: Diagnosis not present

## 2018-08-10 DIAGNOSIS — K0530 Chronic periodontitis, unspecified: Secondary | ICD-10-CM | POA: Diagnosis not present

## 2018-08-10 DIAGNOSIS — H409 Unspecified glaucoma: Secondary | ICD-10-CM | POA: Diagnosis present

## 2018-08-10 DIAGNOSIS — I350 Nonrheumatic aortic (valve) stenosis: Secondary | ICD-10-CM | POA: Diagnosis not present

## 2018-08-10 DIAGNOSIS — Z01810 Encounter for preprocedural cardiovascular examination: Secondary | ICD-10-CM | POA: Diagnosis not present

## 2018-08-10 DIAGNOSIS — E1165 Type 2 diabetes mellitus with hyperglycemia: Secondary | ICD-10-CM | POA: Diagnosis present

## 2018-08-10 DIAGNOSIS — R296 Repeated falls: Secondary | ICD-10-CM | POA: Diagnosis not present

## 2018-08-10 DIAGNOSIS — M4316 Spondylolisthesis, lumbar region: Secondary | ICD-10-CM | POA: Diagnosis not present

## 2018-08-10 DIAGNOSIS — D500 Iron deficiency anemia secondary to blood loss (chronic): Secondary | ICD-10-CM | POA: Diagnosis not present

## 2018-08-10 DIAGNOSIS — R0600 Dyspnea, unspecified: Secondary | ICD-10-CM | POA: Diagnosis present

## 2018-08-10 LAB — GLUCOSE, CAPILLARY
GLUCOSE-CAPILLARY: 151 mg/dL — AB (ref 70–99)
GLUCOSE-CAPILLARY: 255 mg/dL — AB (ref 70–99)
GLUCOSE-CAPILLARY: 189 mg/dL — AB (ref 70–99)

## 2018-08-10 LAB — BASIC METABOLIC PANEL
Anion gap: 12 (ref 5–15)
BUN: 20 mg/dL (ref 8–23)
CHLORIDE: 102 mmol/L (ref 98–111)
CO2: 25 mmol/L (ref 22–32)
CREATININE: 1.72 mg/dL — AB (ref 0.44–1.00)
Calcium: 7.8 mg/dL — ABNORMAL LOW (ref 8.9–10.3)
GFR calc non Af Amer: 29 mL/min — ABNORMAL LOW (ref >60)
GFR, EST AFRICAN AMERICAN: 33 mL/min — AB (ref >60)
Glucose, Bld: 122 mg/dL — ABNORMAL HIGH (ref 70–99)
Potassium: 3.7 mmol/L (ref 3.5–5.1)
Sodium: 139 mmol/L (ref 135–145)

## 2018-08-10 LAB — CBC
HEMATOCRIT: 31.6 % — AB (ref 36.0–46.0)
Hemoglobin: 10.4 g/dL — ABNORMAL LOW (ref 12.0–15.0)
MCH: 30 pg (ref 26.0–34.0)
MCHC: 32.9 g/dL (ref 30.0–36.0)
MCV: 91.1 fL (ref 78.0–100.0)
PLATELETS: 183 10*3/uL (ref 150–400)
RBC: 3.47 MIL/uL — AB (ref 3.87–5.11)
RDW: 13.9 % (ref 11.5–15.5)
WBC: 12.9 10*3/uL — AB (ref 4.0–10.5)

## 2018-08-10 LAB — MRSA PCR SCREENING: MRSA by PCR: NEGATIVE

## 2018-08-10 LAB — LIPID PANEL
CHOL/HDL RATIO: 4.5 ratio
Cholesterol: 162 mg/dL (ref 0–200)
HDL: 36 mg/dL — AB (ref >40)
LDL CALC: 61 mg/dL (ref 0–99)
Triglycerides: 323 mg/dL — ABNORMAL HIGH (ref <150)
VLDL: 65 mg/dL — AB (ref 0–40)

## 2018-08-10 LAB — TROPONIN I: Troponin I: 0.09 ng/mL (ref <0.03)

## 2018-08-10 MED ORDER — SODIUM CHLORIDE 0.9 % IV SOLN: 250.0000 mL | INTRAVENOUS | Status: DC | PRN

## 2018-08-10 MED ORDER — SODIUM CHLORIDE 0.9% FLUSH: 3.0000 mL | INTRAVENOUS | Status: DC | PRN

## 2018-08-10 MED ORDER — ASPIRIN 81 MG PO CHEW
81.0000 mg | CHEWABLE_TABLET | ORAL | Status: AC
  Administered 2018-08-11: 81 mg via ORAL
  Filled 2018-08-10: qty 1

## 2018-08-10 MED ORDER — INSULIN ASPART 100 UNIT/ML ~~LOC~~ SOLN
0.0000 [IU] | Freq: Three times a day (TID) | SUBCUTANEOUS | Status: DC
  Administered 2018-08-10: 8 [IU] via SUBCUTANEOUS
  Administered 2018-08-11: 2 [IU] via SUBCUTANEOUS
  Administered 2018-08-12 (×2): 3 [IU] via SUBCUTANEOUS
  Administered 2018-08-12: 5 [IU] via SUBCUTANEOUS
  Administered 2018-08-13: 2 [IU] via SUBCUTANEOUS
  Administered 2018-08-13 (×2): 3 [IU] via SUBCUTANEOUS
  Administered 2018-08-14: 5 [IU] via SUBCUTANEOUS
  Administered 2018-08-14: 8 [IU] via SUBCUTANEOUS
  Administered 2018-08-14 – 2018-08-15 (×2): 15 [IU] via SUBCUTANEOUS
  Administered 2018-08-15: 5 [IU] via SUBCUTANEOUS
  Administered 2018-08-15: 8 [IU] via SUBCUTANEOUS

## 2018-08-10 MED ORDER — SODIUM CHLORIDE 0.9% FLUSH
3.0000 mL | Freq: Two times a day (BID) | INTRAVENOUS | Status: DC
  Administered 2018-08-10: 3 mL via INTRAVENOUS

## 2018-08-10 MED ORDER — SODIUM CHLORIDE 0.9 % IV SOLN
INTRAVENOUS | Status: DC
  Administered 2018-08-10: 20:00:00 via INTRAVENOUS

## 2018-08-10 MED ORDER — SODIUM CHLORIDE 0.9 % IV SOLN
INTRAVENOUS | Status: DC
  Administered 2018-08-11: 14:00:00 via INTRAVENOUS

## 2018-08-10 NOTE — Consult Note (Signed)
DENTAL CONSULTATION  Date of Consultation:  08/10/2018 Patient Name:   [name] [name] Date of Birth:   17-May-1946 Medical Record Number: [id number]  VITALS: BP (!) 157/76 (BP Location: Right Arm)   Pulse 73   Temp 98.7 F (37.1 C) (Oral)   Resp 18   Ht 4\' 9"  (1.448 m)   Wt 63.9 kg   SpO2 100%   BMI 30.48 kg/m   CHIEF COMPLAINT: The patient was referred by Dr. Darlina Guys for a dental consultation.  HPI: [name] [name] is a 72 year old female recently diagnosed with severe aortic stenosis.  Patient with multiple medical comorbidities.  Patient with anticipated TAVR procedure in the future with Dr. Angelena Form.  Patient is now seen as part of a pre-heart valve surgery dental protocol examination to rule out dental infection that may affect the patient's systemic health and anticipated heart valve surgery.  The patient currently denies acute toothaches, swellings, or abscesses.  Patient indicates that she was seen by Dr. Harrington Challenger, a general dentist, approximately 2 months ago in [place], [place].  Patient indicates that Dr. Harrington Challenger referred the patient to an oral surgeon in [place], [place] for extraction of 3 maxillary anterior teeth.  The patient could not remember the name of that oral surgeon.  The patient states she could not afford the cost of the dental treatment at that time.  The patient still has not had any dental treatment of those maxillary anterior teeth.  Patient denies having any partial dentures.  She denies having dental phobia.  PROBLEM LIST: Patient Active Problem List   Diagnosis Date Noted  . Aortic stenosis 08/02/2014    Priority: High  . Chest pain 02/02/2018  . Frequent falls 02/02/2018  . Bowel habit changes 10/16/2016  . History of gastric ulcer 10/16/2016  . Dysphagia 10/16/2016  . Spondylolisthesis of lumbar region 08/03/2014  . Essential hypertension 08/02/2014  . Hyperlipidemia 08/02/2014  . Diabetes ([place]) 08/02/2014  . Primary  osteoarthritis of both knees 06/14/2014  . Right leg weakness 10/31/2013  . H/O adenomatous polyp of colon 08/17/2012  . Family history of colon cancer 08/17/2012  . Chronic nausea 08/17/2012    PMH: Past Medical History:  Diagnosis Date  . Anemia   . Anxiety   . Aortic stenosis   . Arthritis   . Blood transfusion without reported diagnosis   . Bronchitis   . CHF (congestive heart failure) ([place])   . Depression   . Diabetes mellitus   . Diverticulitis   . Dyspnea   . Glaucoma   . Heart murmur   . Hyperlipidemia   . Hypertension   . Hypothyroidism   . Macular degeneration   . Melanoma ([place])    left thigh, 2010  . Osteoarthritis   . Peptic ulcer   . Pre-diabetes   . Rheumatoid arthritis(714.0)   . Thyroid disease     PSH: Past Surgical History:  Procedure Laterality Date  . ABDOMINAL HYSTERECTOMY  1988  . APPENDECTOMY    . BACK SURGERY  07/2008   fusion  . BACK SURGERY  12/9507   complicated by post-op anemia requiring transfusion  . BREAST SURGERY     right breast biopsy  . CATARACT EXTRACTION    . CHOLECYSTECTOMY  1974  . COLONOSCOPY  06/06/2007   Proximal diminutive rectal polyps cold biopsied/removed, otherwise normal rectum/ Left-sided diverticula, the remainder of the colonic mucosa appeared normal. Path-tubular adnemoa.   . COLONOSCOPY  09/14/2012   TOI:ZTIWPY polyps-removed as described  above Colonic diverticulosis. Poor prep. next TCS five years.   . COLONOSCOPY N/A 10/29/2016   Procedure: COLONOSCOPY;  Surgeon: Daneil Dolin, MD;  [facility]: [facility];  Service: Endoscopy;  Laterality: N/A;  2:00 pm  . ESOPHAGOGASTRODUODENOSCOPY  09/2012   [place]k: gastric ulcer, gastric duodenal erosions, small hh. benign bx  . ESOPHAGOGASTRODUODENOSCOPY N/A 10/29/2016   Procedure: ESOPHAGOGASTRODUODENOSCOPY (EGD);  Surgeon: Daneil Dolin, MD;  [facility]: [facility];  Service: Endoscopy;  Laterality: N/A;  . EYE SURGERY    . FOOT SURGERY  1994   left   .  LUMBAR SPINE SURGERY  08/04/2014   dr Joya Salm  . macular degen    . MALONEY DILATION N/A 10/29/2016   Procedure: Venia Minks DILATION;  Surgeon: Daneil Dolin, MD;  Location: [facility];  Service: Endoscopy;  Laterality: N/A;  . MELANOMA EXCISION  2010  . YAG LASER APPLICATION     OS    ALLERGIES: No Known Allergies  MEDICATIONS: Current Facility-Administered Medications  Medication Dose Route Frequency Provider Last Rate Last Dose  . 0.9 %  sodium chloride infusion  250 mL Intravenous PRN Kathyrn Drown D, NP      . acetaminophen (TYLENOL) tablet 650 mg  650 mg Oral Q4H PRN Kathyrn Drown D, NP      . albuterol (PROVENTIL) (2.5 MG/3ML) 0.083% nebulizer solution 3 mL  3 mL Nebulization Q4H PRN Kathyrn Drown D, NP      . amLODipine (NORVASC) tablet 5 mg  5 mg Oral QHS Kathyrn Drown D, NP   5 mg at 08/09/18 2214  . aspirin EC tablet 81 mg  81 mg Oral Daily Kathyrn Drown D, NP      . gabapentin (NEURONTIN) capsule 300 mg  300 mg Oral BID Kathyrn Drown D, NP   300 mg at 08/09/18 1730  . heparin injection 5,000 Units  5,000 Units Subcutaneous Q8H Kathyrn Drown D, NP   5,000 Units at 08/10/18 [phone]  . latanoprost (XALATAN) 0.005 % ophthalmic solution 1 drop  1 drop Both Eyes QHS Kathyrn Drown D, NP   1 drop at 08/09/18 2226  . levothyroxine (SYNTHROID, LEVOTHROID) tablet 88 mcg  88 mcg Oral QAC breakfast Kathyrn Drown D, NP   88 mcg at 08/10/18 5732  . nitroGLYCERIN (NITROSTAT) SL tablet 0.4 mg  0.4 mg Sublingual Q5 Min x 3 PRN Kathyrn Drown D, NP      . ondansetron [facility]) injection 4 mg  4 mg Intravenous Q6H PRN Kathyrn Drown D, NP      . simvastatin (ZOCOR) tablet 20 mg  20 mg Oral QHS Kathyrn Drown D, NP   20 mg at 08/09/18 2214  . sodium chloride flush (NS) 0.9 % injection 3 mL  3 mL Intravenous Q12H Kathyrn Drown D, NP   3 mL at 08/09/18 2300  . sodium chloride flush (NS) 0.9 % injection 3 mL  3 mL Intravenous PRN Kathyrn Drown D, NP      . timolol (TIMOPTIC) 0.5 % ophthalmic  solution 1 drop  1 drop Both Eyes BID Kathyrn Drown D, NP   1 drop at 08/09/18 2217    LABS: Lab Results  Component Value Date   WBC 12.9 (H) 08/09/2018   HGB 10.4 (L) 08/09/2018   HCT 31.6 (L) 08/09/2018   MCV 91.1 08/09/2018   PLT 183 08/09/2018      Component Value Date/Time   NA 139 08/09/2018 2339   NA 139 08/04/2018 1658   K 3.7  08/09/2018 2339   CL 102 08/09/2018 2339   CO2 25 08/09/2018 2339   GLUCOSE 122 (H) 08/09/2018 2339   BUN 20 08/09/2018 2339   BUN 21 08/04/2018 1658   CREATININE 1.72 (H) 08/09/2018 2339   CALCIUM 7.8 (L) 08/09/2018 2339   GFRNONAA 29 (L) 08/09/2018 2339   GFRAA 33 (L) 08/09/2018 2339   No results found for: INR, PROTIME No results found for: PTT  SOCIAL HISTORY: Social History   Socioeconomic History  . Marital status: Widowed    Spouse name: Not on file  . Number of children: 2  . Years of education: Not on file  . Highest education level: Not on file  Occupational History  . Not on file  Social Needs  . Financial resource strain: Somewhat hard  . Food insecurity:    Worry: Sometimes true    Inability: Sometimes true  . Transportation needs:    Medical: No    Non-medical: Yes  Tobacco Use  . Smoking status: Never Smoker  . Smokeless tobacco: Never Used  Substance and Sexual Activity  . Alcohol use: No    Comment: "Wine a couple times a month"  . Drug use: No  . Sexual activity: Yes    Birth control/protection: Surgical  Lifestyle  . Physical activity:    Days per week: 7 days    Minutes per session: 60 min  . Stress: Only a little  Relationships  . Social connections:    Talks on phone: More than three times a week    Gets together: More than three times a week    Attends religious service: Not on file    Active member of club or organization: Not on file    Attends meetings of clubs or organizations: Not on file    Relationship status: Not on file  . Intimate partner violence:    Fear of current or ex partner:  No    Emotionally abused: No    Physically abused: No    Forced sexual activity: No  Other Topics Concern  . Not on file  Social History Narrative   Husband deceased 04-17-11.     FAMILY HISTORY: Family History  Problem Relation Age of Onset  . Colon cancer Brother        diagnosed at age 79  . Diabetes Brother   . Hypertension Mother   . Heart failure Mother   . Diabetes Mother   . Glaucoma Mother   . Hypertension Other   . Other Other   . Heart failure Father        passed away of MI in his 90s  . Diabetes Sister   . Hypertension Sister   . Breast cancer Sister   . Heart attack Brother   . Diabetes Sister   . Hypertension Sister   . Other Sister        tumor  . Hypertension Sister   . Hypertension Daughter   . Bipolar disorder Daughter   . Anesthesia problems Neg Hx   . Amblyopia Neg Hx   . Blindness Neg Hx   . Cataracts Neg Hx   . Macular degeneration Neg Hx   . Retinal detachment Neg Hx   . Strabismus Neg Hx   . Retinitis pigmentosa Neg Hx     REVIEW OF SYSTEMS: Reviewed with the patient as per History of present illness. Psych: Patient denies having dental phobia.  DENTAL HISTORY: CHIEF COMPLAINT: The patient was referred by Dr. Darlina Guys  for a dental consultation.  HPI: [name] [name] is a 72 year old female recently diagnosed with severe aortic stenosis.  Patient with multiple medical comorbidities.  Patient with anticipated TAVR procedure in the future with Dr. Angelena Form.  Patient is now seen as part of a pre-heart valve surgery dental protocol examination to rule out dental infection that may affect the patient's systemic health and anticipated heart valve surgery.  The patient currently denies acute toothaches, swellings, or abscesses.  Patient indicates that she was seen by Dr. Harrington Challenger, a general dentist, approximately 2 months ago in [place], [place].  Patient indicates that Dr. Harrington Challenger referred the patient to an oral surgeon in [place],  [place] for extraction of 3 maxillary anterior teeth.  The patient could not remember the name of that oral surgeon.  The patient states she could not afford the cost of the dental treatment at that time.  The patient still has not had any dental treatment of those maxillary anterior teeth.  Patient denies having any partial dentures.  She denies having dental phobia.  DENTAL EXAMINATION: GENERAL: The patient is a well-developed, obese female no acute distress. HEAD AND NECK: There is no palpable neck lymphadenopathy.  The patient denies acute TMJ symptoms. INTRAORAL EXAM: Patient has normal saliva.  There is no evidence of oral abscess formation. DENTITION: There are multiple missing teeth.  There are retained roots in the area of tooth numbers 7, 8, and 9. PERIODONTAL: The patient has chronic periodontitis with plaque and calculus accumulations, gingival recession, and no significant tooth mobility.  There is incipient a moderate bone loss noted.  Patient has significant malodor coming from her mouth. DENTAL CARIES/SUBOPTIMAL RESTORATIONS: Dental caries are noted. ENDODONTIC: The patient denies having any acute pulpitis symptoms. I do not see any evidence of periapical pathology or radiolucency at this time. Tooth #9 has had a previous root canal therapy that is now exposed to the oral environment. CROWN AND BRIDGE: There are no crown or bridge restorations noted. PROSTHODONTIC: Patient denies having any partial dentures. OCCLUSION: The patient has a poor occlusal scheme secondary to multiple missing teeth, multiple retained root segments, and lack replacement of missing teeth dental prostheses.  RADIOGRAPHIC INTERPRETATION: The orthopantogram was taken today. There are multiple missing teeth. There are retained root segments in the area tooth numbers 7, 8, and 9.tooth #9 has a previous root canal therapy that is now exposed to the oral environment. Dental caries are noted. There is  incipient to moderate bone loss noted. There is supra-eruption and drifting of the unopposed teeth into the edentulous areas.   ASSESSMENTS: 1.  Severe aortic stenosis 2.  Pre-heart valve surgery dental protocol 3.  Multiple retained root segments 4.  Dental caries 5.  Chronic periodontitis with bone loss 6.  Accretions 7.  Gingival recession 8.  Incipient tooth mobility 9.  Multiple missing teeth 10.  Supraeruption and drifting of the unopposed teeth into the edentulous areas. 11.  Poor occlusal scheme and malocclusion 12.  Risk for bleeding with invasive dental procedures due to current heparin subcutaneous therapy. 64.  Risk for complications up to and including death with anticipated invasive dental procedures in the operating room with general anesthesia.   PLAN/RECOMMENDATIONS: 1. I discussed the risks, benefits, and complications of various treatment options with the patient in relationship to her medical and dental conditions, anticipated heart valve surgery, and risk for endocarditis. We discussed various treatment options to include no treatment, multiple extractions with alveoloplasty, pre-prosthetic surgery as indicated, periodontal  therapy, dental restorations, root canal therapy, crown and bridge therapy, implant therapy, and replacement of missing teeth as indicated.  Patient will require multiple dental extractions with alveoloplasty and gross debridement of remaining dentition in operating room with general anesthesia or patient may follow-up with the original oral surgeon for the dental extraction procedures.  I will be unable provide dental treatment until I return to the Dental Medicine service early next week from vacation.  There does not appear to be any oral surgeon on call this week.  After dental extraction procedures, the patient will need to follow-up with a general dentist of her choice for evaluation for replacement of missing teeth and continued comprehensive dental  care.  Patient is aware that she will require antibiotic premedication prior to invasive dental procedures after the heart valve surgery per American Heart Association guidelines.   2. Discussion of findings with medical team and coordination of future medical and dental care as needed.     Lenn Cal, DDS

## 2018-08-10 NOTE — Progress Notes (Signed)
Progress Note  Patient Name: [name] [name] Date of Encounter: 08/10/2018  Primary Cardiologist: Kate Sable, MD   Subjective   No CP   Breathing is OK    Inpatient Medications    Scheduled Meds: . amLODipine  5 mg Oral QHS  . aspirin EC  81 mg Oral Daily  . gabapentin  300 mg Oral BID  . heparin  5,000 Units Subcutaneous Q8H  . insulin aspart  0-15 Units Subcutaneous TID WC  . latanoprost  1 drop Both Eyes QHS  . levothyroxine  88 mcg Oral QAC breakfast  . simvastatin  20 mg Oral QHS  . sodium chloride flush  3 mL Intravenous Q12H  . timolol  1 drop Both Eyes BID   Continuous Infusions: . sodium chloride     PRN Meds: sodium chloride, acetaminophen, albuterol, nitroGLYCERIN, ondansetron (ZOFRAN) IV, sodium chloride flush   Vital Signs    Vitals:   08/09/18 2011 08/09/18 2353 08/10/18 0802 08/10/18 1140  BP: 125/68 140/84 (!) 157/76 (!) 158/74  Pulse: 69 65 73 67  Resp: 20 20 18 16   Temp: 98.4 F (36.9 C) 98.1 F (36.7 C) 98.7 F (37.1 C) 98.2 F (36.8 C)  TempSrc: Oral Oral Oral Oral  SpO2: 100% 100% 100% 100%  Weight:      Height:        Intake/Output Summary (Last 24 hours) at 08/10/2018 1255 Last data filed at 08/10/2018 1141 Gross per 24 hour  Intake 3 ml  Output 600 ml  Net -597 ml   Filed Weights   08/09/18 1400  Weight: 63.9 kg    Telemetry    SR - Personally Reviewed  ECG      Physical Exam   GEN: No acute distress.   Neck: No JVD Cardiac: RRR  Gr III/VI later peaking systolic murmur LSB   No, rubs, or gallops.  Respiratory: Clear to auscultation bilaterally. GI: Soft, nontender, non-distended  MS: No edema; No deformity. Neuro:  Nonfocal  Psych: Normal affect   Labs    Chemistry Recent Labs  Lab 08/04/18 1658 08/09/18 1200 08/09/18 2339  NA 139 139 139  K 3.8 3.7 3.7  CL 98 105 102  CO2 23 25 25   GLUCOSE 194* 181* 122*  BUN 21 18 20   CREATININE 1.35* 1.46* 1.72*  CALCIUM 10.3 7.8* 7.8*  PROT  --  5.6*   --   ALBUMIN  --  2.9*  --   AST  --  20  --   ALT  --  16  --   ALKPHOS  --  69  --   BILITOT  --  0.6  --   GFRNONAA 39* 35* 29*  GFRAA 45* 40* 33*  ANIONGAP  --  9 12     Hematology Recent Labs  Lab 08/04/18 1658 08/09/18 1200 08/09/18 2339  WBC 10.5 11.7* 12.9*  RBC 4.44 3.54* 3.47*  HGB 13.0 10.7* 10.4*  HCT 38.6 32.6* 31.6*  MCV 87 92.1 91.1  MCH 29.3 30.2 30.0  MCHC 33.7 32.8 32.9  RDW 14.9 13.6 13.9  PLT 284 159 183    Cardiac Enzymes Recent Labs  Lab 08/09/18 1200 08/09/18 1931 08/09/18 2339  TROPONINI 0.14* 0.11* 0.09*   No results for input(s): TROPIPOC in the last 168 hours.   BNP Recent Labs  Lab 08/09/18 1200  BNP 489.1*     DDimer No results for input(s): DDIMER in the last 168 hours.   Radiology  Dg Chest Port 1 View  Result Date: 08/09/2018 CLINICAL DATA:  Shortness of breath. History of aortic stenosis, hypertension, diabetes, nonsmoker. EXAM: PORTABLE CHEST 1 VIEW COMPARISON:  PA and lateral chest x-ray of August 09, 2018 at 3:27 a.m. FINDINGS: The lungs are reasonably well inflated. The interstitial markings are less conspicuous but remain increased. The cardiac silhouette remains enlarged. The pulmonary vascularity remains engorged but is more distinct. There is calcification in the wall of the aortic arch. The bony thorax exhibits no acute abnormality. IMPRESSION: CHF with decreased pulmonary interstitial edema. Stable cardiomegaly. Thoracic aortic atherosclerosis. Electronically Signed   By: David  Martinique M.D.   On: 08/09/2018 12:39    Cardiac Studies     Patient Profile     72 y.o. female history of severe AS, HTN and CHF   Admitted for shortness of breath  Assessment & Plan    1  Acute on chronic diastolic CHF   Pt is comfortable today Laying in bed Not on lasix Follow I/O    Plan for R and L heart cath tomorrow   Gentle hydration prior  No LV gram  2   Aortic stenosis   Plan for cath to eval as part of TAVR eval  3  HTN    Follow    4  Renal  Cr 1.7 today   Hydrate prior     5  Dental  Appreciate input by Dr Enrique Sack   Will complete prior   For questions or updates, please contact [facility] Please consult [email or web address] for contact info under Cardiology/STEMI.      Signed, Dorris Carnes, MD  08/10/2018, 12:55 PM

## 2018-08-10 NOTE — Care Management Note (Addendum)
Case Management Note  Patient Details  Name: [name] [name] MRN: [id number] Date of Birth: 07/10/1946  Subjective/Objective:      Pt admitted for TAVR workup              Action/Plan:  PTA Independent from home with daughter.  Pt has PCP.     Expected Discharge Date:  08/12/18               Expected Discharge Plan:  Home/Self Care  In-House Referral:  Clinical Social Work  Discharge planning Services  CM Consult  Post Acute Care Choice:    Choice offered to:     DME Arranged:    DME Agency:     HH Arranged:    HH Agency:     Status of Service:     If discussed at [employer], dates discussed:    Additional Comments:  08/10/18 Pt repordily has bed bugs - CSW consulted to provide extermination resources.   Maryclare Labrador, RN 08/10/2018, 9:03 AM

## 2018-08-11 ENCOUNTER — Encounter (HOSPITAL_COMMUNITY)
Admission: AD | Disposition: A | Discharge status: Home Health | Payer: Self-pay | Source: Other Acute Inpatient Hospital | Attending: Internal Medicine

## 2018-08-11 DIAGNOSIS — I350 Nonrheumatic aortic (valve) stenosis: Secondary | ICD-10-CM

## 2018-08-11 HISTORY — PX: RIGHT/LEFT HEART CATH AND CORONARY ANGIOGRAPHY: CATH118266

## 2018-08-11 LAB — POCT I-STAT 3, VENOUS BLOOD GAS (G3P V)
Acid-base deficit: 1 mmol/L (ref 0.0–2.0)
BICARBONATE: 25.9 mmol/L (ref 20.0–28.0)
O2 Saturation: 66 %
PH VEN: 7.314 (ref 7.250–7.430)
PO2 VEN: 38 mmHg (ref 32.0–45.0)
TCO2: 27 mmol/L (ref 22–32)
pCO2, Ven: 50.9 mmHg (ref 44.0–60.0)

## 2018-08-11 LAB — BASIC METABOLIC PANEL
ANION GAP: 8 (ref 5–15)
BUN: 23 mg/dL (ref 8–23)
CHLORIDE: 104 mmol/L (ref 98–111)
CO2: 25 mmol/L (ref 22–32)
Calcium: 7.3 mg/dL — ABNORMAL LOW (ref 8.9–10.3)
Creatinine, Ser: 1.74 mg/dL — ABNORMAL HIGH (ref 0.44–1.00)
GFR calc non Af Amer: 28 mL/min — ABNORMAL LOW (ref >60)
GFR, EST AFRICAN AMERICAN: 32 mL/min — AB (ref >60)
Glucose, Bld: 115 mg/dL — ABNORMAL HIGH (ref 70–99)
POTASSIUM: 4.2 mmol/L (ref 3.5–5.1)
Sodium: 137 mmol/L (ref 135–145)

## 2018-08-11 LAB — POCT I-STAT 3, ART BLOOD GAS (G3+)
Acid-base deficit: 6 mmol/L — ABNORMAL HIGH (ref 0.0–2.0)
Bicarbonate: 22.6 mmol/L (ref 20.0–28.0)
O2 SAT: 93 %
PCO2 ART: 59 mmHg — AB (ref 32.0–48.0)
PH ART: 7.192 — AB (ref 7.350–7.450)
TCO2: 24 mmol/L (ref 22–32)
pO2, Arterial: 82 mmHg — ABNORMAL LOW (ref 83.0–108.0)

## 2018-08-11 LAB — GLUCOSE, CAPILLARY
GLUCOSE-CAPILLARY: 140 mg/dL — AB (ref 70–99)
GLUCOSE-CAPILLARY: 141 mg/dL — AB (ref 70–99)
GLUCOSE-CAPILLARY: 156 mg/dL — AB (ref 70–99)
Glucose-Capillary: 149 mg/dL — ABNORMAL HIGH (ref 70–99)

## 2018-08-11 SURGERY — RIGHT/LEFT HEART CATH AND CORONARY ANGIOGRAPHY
Anesthesia: LOCAL

## 2018-08-11 MED ORDER — MIDAZOLAM HCL 2 MG/2ML IJ SOLN
INTRAMUSCULAR | Status: AC
  Filled 2018-08-11: qty 2

## 2018-08-11 MED ORDER — MIDAZOLAM HCL 2 MG/2ML IJ SOLN
INTRAMUSCULAR | Status: DC | PRN
  Administered 2018-08-11 (×2): 1 mg via INTRAVENOUS

## 2018-08-11 MED ORDER — IOPAMIDOL (ISOVUE-370) INJECTION 76%
INTRAVENOUS | Status: AC
  Filled 2018-08-11: qty 100

## 2018-08-11 MED ORDER — HEPARIN SODIUM (PORCINE) 5000 UNIT/ML IJ SOLN
5000.0000 [IU] | Freq: Three times a day (TID) | INTRAMUSCULAR | Status: AC
  Administered 2018-08-12 – 2018-08-16 (×15): 5000 [IU] via SUBCUTANEOUS
  Filled 2018-08-11 (×15): qty 1

## 2018-08-11 MED ORDER — IOPAMIDOL (ISOVUE-370) INJECTION 76%
INTRAVENOUS | Status: DC | PRN
  Administered 2018-08-11: 50 mL via INTRA_ARTERIAL

## 2018-08-11 MED ORDER — SODIUM CHLORIDE 0.9 % IV SOLN: INTRAVENOUS | Status: AC

## 2018-08-11 MED ORDER — VERAPAMIL HCL 2.5 MG/ML IV SOLN
INTRAVENOUS | Status: DC | PRN
  Administered 2018-08-11: 10 mL via INTRA_ARTERIAL

## 2018-08-11 MED ORDER — HEPARIN (PORCINE) IN NACL 1000-0.9 UT/500ML-% IV SOLN
INTRAVENOUS | Status: AC
  Filled 2018-08-11: qty 1000

## 2018-08-11 MED ORDER — FENTANYL CITRATE (PF) 100 MCG/2ML IJ SOLN
INTRAMUSCULAR | Status: AC
  Filled 2018-08-11: qty 2

## 2018-08-11 MED ORDER — HEPARIN SODIUM (PORCINE) 1000 UNIT/ML IJ SOLN
INTRAMUSCULAR | Status: DC | PRN
  Administered 2018-08-11: 3000 [IU] via INTRAVENOUS

## 2018-08-11 MED ORDER — SODIUM CHLORIDE 0.9% FLUSH: 3.0000 mL | INTRAVENOUS | Status: DC | PRN

## 2018-08-11 MED ORDER — HEPARIN (PORCINE) IN NACL 1000-0.9 UT/500ML-% IV SOLN
INTRAVENOUS | Status: DC | PRN
  Administered 2018-08-11: 500 mL

## 2018-08-11 MED ORDER — VERAPAMIL HCL 2.5 MG/ML IV SOLN
INTRAVENOUS | Status: AC
  Filled 2018-08-11: qty 2

## 2018-08-11 MED ORDER — HEPARIN SODIUM (PORCINE) 1000 UNIT/ML IJ SOLN
INTRAMUSCULAR | Status: AC
  Filled 2018-08-11: qty 1

## 2018-08-11 MED ORDER — LIDOCAINE HCL (PF) 1 % IJ SOLN
INTRAMUSCULAR | Status: DC | PRN
  Administered 2018-08-11 (×2): 2 mL via INTRADERMAL

## 2018-08-11 MED ORDER — SODIUM CHLORIDE 0.9% FLUSH
3.0000 mL | Freq: Two times a day (BID) | INTRAVENOUS | Status: DC
  Administered 2018-08-11 – 2018-08-13 (×4): 3 mL via INTRAVENOUS

## 2018-08-11 MED ORDER — SODIUM CHLORIDE 0.9 % IV SOLN
250.0000 mL | INTRAVENOUS | Status: DC | PRN
  Administered 2018-08-17: 250 mL via INTRAVENOUS

## 2018-08-11 MED ORDER — FENTANYL CITRATE (PF) 100 MCG/2ML IJ SOLN
INTRAMUSCULAR | Status: DC | PRN
  Administered 2018-08-11 (×2): 25 ug via INTRAVENOUS

## 2018-08-11 MED ORDER — LIDOCAINE HCL (PF) 1 % IJ SOLN
INTRAMUSCULAR | Status: AC
  Filled 2018-08-11: qty 30

## 2018-08-11 SURGICAL SUPPLY — 13 items
CATH BALLN WEDGE 5F 110CM (CATHETERS) ×2 IMPLANT
CATH INFINITI 5 FR JL3.5 (CATHETERS) ×2 IMPLANT
CATH INFINITI JR4 5F (CATHETERS) ×2 IMPLANT
DEVICE RAD COMP TR BAND LRG (VASCULAR PRODUCTS) ×2 IMPLANT
GLIDESHEATH SLEND SS 6F .021 (SHEATH) ×2 IMPLANT
GUIDEWIRE INQWIRE 1.5J.035X260 (WIRE) ×1 IMPLANT
INQWIRE 1.5J .035X260CM (WIRE) ×2
KIT HEART LEFT (KITS) ×2 IMPLANT
PACK CARDIAC CATHETERIZATION (CUSTOM PROCEDURE TRAY) ×2 IMPLANT
SHEATH GLIDE SLENDER 4/5FR (SHEATH) ×2 IMPLANT
TRANSDUCER W/STOPCOCK (MISCELLANEOUS) ×2 IMPLANT
TUBING CIL FLEX 10 FLL-RA (TUBING) ×2 IMPLANT
WIRE HI TORQ VERSACORE-J 145CM (WIRE) ×2 IMPLANT

## 2018-08-11 NOTE — H&P (View-Only) (Signed)
Progress Note  Patient Name: [name] [name] Date of Encounter: 08/11/2018  Primary Cardiologist: Kate Sable, MD   Subjective     Inpatient Medications    Scheduled Meds: . amLODipine  5 mg Oral QHS  . aspirin EC  81 mg Oral Daily  . gabapentin  300 mg Oral BID  . heparin  5,000 Units Subcutaneous Q8H  . insulin aspart  0-15 Units Subcutaneous TID WC  . latanoprost  1 drop Both Eyes QHS  . levothyroxine  88 mcg Oral QAC breakfast  . simvastatin  20 mg Oral QHS  . sodium chloride flush  3 mL Intravenous Q12H  . sodium chloride flush  3 mL Intravenous Q12H  . timolol  1 drop Both Eyes BID   Continuous Infusions: . sodium chloride    . sodium chloride    . sodium chloride    . sodium chloride 50 mL/hr at 08/10/18 2005   PRN Meds: sodium chloride, sodium chloride, acetaminophen, albuterol, nitroGLYCERIN, ondansetron (ZOFRAN) IV, sodium chloride flush, sodium chloride flush   Vital Signs    Vitals:   08/10/18 2200 08/11/18 0000 08/11/18 0433 08/11/18 0752  BP: (!) 151/78 139/66 118/63 130/69  Pulse: 70 72 69 66  Resp: 19 20 16 15   Temp:  98.6 F (37 C) 98.3 F (36.8 C) 98.4 F (36.9 C)  TempSrc:  Oral Oral Oral  SpO2: 98% 99% 94% (!) 88%  Weight:      Height:        Intake/Output Summary (Last 24 hours) at 08/11/2018 0834 Last data filed at 08/10/2018 2240 Gross per 24 hour  Intake 243 ml  Output 925 ml  Net -682 ml   Filed Weights   08/09/18 1400 08/10/18 1705  Weight: 63.9 kg 63 kg    Telemetry    SR - Personally Reviewed  ECG      Physical Exam   Deferred  Pt sleeping comfortably in bed   Labs    Chemistry Recent Labs  Lab 08/09/18 1200 08/09/18 2339 08/11/18 0412  NA 139 139 137  K 3.7 3.7 4.2  CL 105 102 104  CO2 25 25 25   GLUCOSE 181* 122* 115*  BUN 18 20 23   CREATININE 1.46* 1.72* 1.74*  CALCIUM 7.8* 7.8* 7.3*  PROT 5.6*  --   --   ALBUMIN 2.9*  --   --   AST 20  --   --   ALT 16  --   --   ALKPHOS 69  --   --    BILITOT 0.6  --   --   GFRNONAA 35* 29* 28*  GFRAA 40* 33* 32*  ANIONGAP 9 12 8      Hematology Recent Labs  Lab 08/04/18 1658 08/09/18 1200 08/09/18 2339  WBC 10.5 11.7* 12.9*  RBC 4.44 3.54* 3.47*  HGB 13.0 10.7* 10.4*  HCT 38.6 32.6* 31.6*  MCV 87 92.1 91.1  MCH 29.3 30.2 30.0  MCHC 33.7 32.8 32.9  RDW 14.9 13.6 13.9  PLT 284 159 183    Cardiac Enzymes Recent Labs  Lab 08/09/18 1200 08/09/18 1931 08/09/18 2339  TROPONINI 0.14* 0.11* 0.09*   No results for input(s): TROPIPOC in the last 168 hours.   BNP Recent Labs  Lab 08/09/18 1200  BNP 489.1*     DDimer No results for input(s): DDIMER in the last 168 hours.   Radiology    Dg Orthopantogram  Result Date: 08/10/2018 CLINICAL DATA:  72 year old female with a  history of poor dentition EXAM: ORTHOPANTOGRAM/PANORAMIC COMPARISON:  None. FINDINGS: Multiple dental amalgams are present. Dental caries present within the right maxillary lateral incisor and presumably canine teeth. No fracture identified. IMPRESSION: Endodontal disease. Electronically Signed   By: Corrie Mckusick D.O.   On: 08/10/2018 13:01   Dg Chest Port 1 View  Result Date: 08/09/2018 CLINICAL DATA:  Shortness of breath. History of aortic stenosis, hypertension, diabetes, nonsmoker. EXAM: PORTABLE CHEST 1 VIEW COMPARISON:  PA and lateral chest x-ray of August 09, 2018 at 3:27 a.m. FINDINGS: The lungs are reasonably well inflated. The interstitial markings are less conspicuous but remain increased. The cardiac silhouette remains enlarged. The pulmonary vascularity remains engorged but is more distinct. There is calcification in the wall of the aortic arch. The bony thorax exhibits no acute abnormality. IMPRESSION: CHF with decreased pulmonary interstitial edema. Stable cardiomegaly. Thoracic aortic atherosclerosis. Electronically Signed   By: David  Martinique M.D.   On: 08/09/2018 12:39    Cardiac Studies     Patient Profile     72 y.o. female history  of severe AS, HTN and CHF   Admitted for shortness of breath  Assessment & Plan    1  Acute on chronic diastolic CHF Plan for R and L heart cath today to define pressures   2   Aortic stenosis  Severe   PLan for R and L heart cath today as part of preTAVR work up   3  HTN  BP is better this AM   4  Renal  Cr 1.74 today   Hydrating  Plan to use minimal contrast with no LV gram  5  Dental  PT seen by Dr Enrique Sack  Plan for extraction next few wks  For questions or updates, please contact [facility] Please consult [email or web address] for contact info under Cardiology/STEMI.      Signed, Dorris Carnes, MD  08/11/2018, 8:34 AM

## 2018-08-11 NOTE — Interval H&P Note (Signed)
History and Physical Interval Note:  08/11/2018 4:38 PM  [name] [name]  has presented today for cardiac cath with the diagnosis of severe AS. The various methods of treatment have been discussed with the patient and family. After consideration of risks, benefits and other options for treatment, the patient has consented to  Procedure(s): RIGHT/LEFT HEART CATH AND CORONARY ANGIOGRAPHY (N/A) as a surgical intervention .  The patient's history has been reviewed, patient examined, no change in status, stable for surgery.  I have reviewed the patient's chart and labs.  Questions were answered to the patient's satisfaction.    Cath Lab Visit (complete for each Cath Lab visit)  Clinical Evaluation Leading to the Procedure:   ACS: No.  Non-ACS:    Anginal Classification: CCS II  Anti-ischemic medical therapy: Minimal Therapy (1 class of medications)  Non-Invasive Test Results: No non-invasive testing performed  Prior CABG: No previous CABG         Lauree Chandler

## 2018-08-11 NOTE — Clinical Social Work Note (Addendum)
Spoke with RN regarding consult for transportation needs. He stated patient has had multiple family members visiting her and after getting to know her further, it does not appear she has transportation issues.\  CSW signing off. Consult again if any other social work needs arise.  [place]rape, [place]

## 2018-08-11 NOTE — Progress Notes (Signed)
Progress Note  Patient Name: [name] [name] Date of Encounter: 08/11/2018  Primary Cardiologist: Kate Sable, MD   Subjective     Inpatient Medications    Scheduled Meds: . amLODipine  5 mg Oral QHS  . aspirin EC  81 mg Oral Daily  . gabapentin  300 mg Oral BID  . heparin  5,000 Units Subcutaneous Q8H  . insulin aspart  0-15 Units Subcutaneous TID WC  . latanoprost  1 drop Both Eyes QHS  . levothyroxine  88 mcg Oral QAC breakfast  . simvastatin  20 mg Oral QHS  . sodium chloride flush  3 mL Intravenous Q12H  . sodium chloride flush  3 mL Intravenous Q12H  . timolol  1 drop Both Eyes BID   Continuous Infusions: . sodium chloride    . sodium chloride    . sodium chloride    . sodium chloride 50 mL/hr at 08/10/18 2005   PRN Meds: sodium chloride, sodium chloride, acetaminophen, albuterol, nitroGLYCERIN, ondansetron (ZOFRAN) IV, sodium chloride flush, sodium chloride flush   Vital Signs    Vitals:   08/10/18 2200 08/11/18 0000 08/11/18 0433 08/11/18 0752  BP: (!) 151/78 139/66 118/63 130/69  Pulse: 70 72 69 66  Resp: 19 20 16 15   Temp:  98.6 F (37 C) 98.3 F (36.8 C) 98.4 F (36.9 C)  TempSrc:  Oral Oral Oral  SpO2: 98% 99% 94% (!) 88%  Weight:      Height:        Intake/Output Summary (Last 24 hours) at 08/11/2018 0834 Last data filed at 08/10/2018 2240 Gross per 24 hour  Intake 243 ml  Output 925 ml  Net -682 ml   Filed Weights   08/09/18 1400 08/10/18 1705  Weight: 63.9 kg 63 kg    Telemetry    SR - Personally Reviewed  ECG      Physical Exam   Deferred  Pt sleeping comfortably in bed   Labs    Chemistry Recent Labs  Lab 08/09/18 1200 08/09/18 2339 08/11/18 0412  NA 139 139 137  K 3.7 3.7 4.2  CL 105 102 104  CO2 25 25 25   GLUCOSE 181* 122* 115*  BUN 18 20 23   CREATININE 1.46* 1.72* 1.74*  CALCIUM 7.8* 7.8* 7.3*  PROT 5.6*  --   --   ALBUMIN 2.9*  --   --   AST 20  --   --   ALT 16  --   --   ALKPHOS 69  --   --    BILITOT 0.6  --   --   GFRNONAA 35* 29* 28*  GFRAA 40* 33* 32*  ANIONGAP 9 12 8      Hematology Recent Labs  Lab 08/04/18 1658 08/09/18 1200 08/09/18 2339  WBC 10.5 11.7* 12.9*  RBC 4.44 3.54* 3.47*  HGB 13.0 10.7* 10.4*  HCT 38.6 32.6* 31.6*  MCV 87 92.1 91.1  MCH 29.3 30.2 30.0  MCHC 33.7 32.8 32.9  RDW 14.9 13.6 13.9  PLT 284 159 183    Cardiac Enzymes Recent Labs  Lab 08/09/18 1200 08/09/18 1931 08/09/18 2339  TROPONINI 0.14* 0.11* 0.09*   No results for input(s): TROPIPOC in the last 168 hours.   BNP Recent Labs  Lab 08/09/18 1200  BNP 489.1*     DDimer No results for input(s): DDIMER in the last 168 hours.   Radiology    Dg Orthopantogram  Result Date: 08/10/2018 CLINICAL DATA:  72 year old female with a  history of poor dentition EXAM: ORTHOPANTOGRAM/PANORAMIC COMPARISON:  None. FINDINGS: Multiple dental amalgams are present. Dental caries present within the right maxillary lateral incisor and presumably canine teeth. No fracture identified. IMPRESSION: Endodontal disease. Electronically Signed   By: Corrie Mckusick D.O.   On: 08/10/2018 13:01   Dg Chest Port 1 View  Result Date: 08/09/2018 CLINICAL DATA:  Shortness of breath. History of aortic stenosis, hypertension, diabetes, nonsmoker. EXAM: PORTABLE CHEST 1 VIEW COMPARISON:  PA and lateral chest x-ray of August 09, 2018 at 3:27 a.m. FINDINGS: The lungs are reasonably well inflated. The interstitial markings are less conspicuous but remain increased. The cardiac silhouette remains enlarged. The pulmonary vascularity remains engorged but is more distinct. There is calcification in the wall of the aortic arch. The bony thorax exhibits no acute abnormality. IMPRESSION: CHF with decreased pulmonary interstitial edema. Stable cardiomegaly. Thoracic aortic atherosclerosis. Electronically Signed   By: David  Martinique M.D.   On: 08/09/2018 12:39    Cardiac Studies     Patient Profile     72 y.o. female history  of severe AS, HTN and CHF   Admitted for shortness of breath  Assessment & Plan    1  Acute on chronic diastolic CHF Plan for R and L heart cath today to define pressures   2   Aortic stenosis  Severe   PLan for R and L heart cath today as part of preTAVR work up   3  HTN  BP is better this AM   4  Renal  Cr 1.74 today   Hydrating  Plan to use minimal contrast with no LV gram  5  Dental  PT seen by Dr Enrique Sack  Plan for extraction next few wks  For questions or updates, please contact [place] Please consult [email or web address] for contact info under Cardiology/STEMI.      Signed, Dorris Carnes, MD  08/11/2018, 8:34 AM

## 2018-08-12 ENCOUNTER — Encounter (HOSPITAL_COMMUNITY): Payer: Self-pay | Admitting: Cardiovascular Disease

## 2018-08-12 DIAGNOSIS — I472 Ventricular tachycardia: Secondary | ICD-10-CM

## 2018-08-12 DIAGNOSIS — I5033 Acute on chronic diastolic (congestive) heart failure: Secondary | ICD-10-CM

## 2018-08-12 LAB — GLUCOSE, CAPILLARY
GLUCOSE-CAPILLARY: 196 mg/dL — AB (ref 70–99)
GLUCOSE-CAPILLARY: 252 mg/dL — AB (ref 70–99)
GLUCOSE-CAPILLARY: 171 mg/dL — AB (ref 70–99)
Glucose-Capillary: 151 mg/dL — ABNORMAL HIGH (ref 70–99)

## 2018-08-12 LAB — BASIC METABOLIC PANEL
ANION GAP: 7 (ref 5–15)
Anion gap: 7 (ref 5–15)
BUN: 25 mg/dL — ABNORMAL HIGH (ref 8–23)
BUN: 21 mg/dL (ref 8–23)
CALCIUM: 8 mg/dL — AB (ref 8.9–10.3)
CO2: 25 mmol/L (ref 22–32)
CO2: 26 mmol/L (ref 22–32)
Calcium: 7.3 mg/dL — ABNORMAL LOW (ref 8.9–10.3)
Chloride: 105 mmol/L (ref 98–111)
Chloride: 105 mmol/L (ref 98–111)
Creatinine, Ser: 1.61 mg/dL — ABNORMAL HIGH (ref 0.44–1.00)
Creatinine, Ser: 1.4 mg/dL — ABNORMAL HIGH (ref 0.44–1.00)
GFR calc Af Amer: 36 mL/min — ABNORMAL LOW (ref >60)
GFR, EST AFRICAN AMERICAN: 42 mL/min — AB (ref >60)
GFR, EST NON AFRICAN AMERICAN: 31 mL/min — AB (ref >60)
GFR, EST NON AFRICAN AMERICAN: 36 mL/min — AB (ref >60)
GLUCOSE: 202 mg/dL — AB (ref 70–99)
Glucose, Bld: 277 mg/dL — ABNORMAL HIGH (ref 70–99)
POTASSIUM: 3.5 mmol/L (ref 3.5–5.1)
Potassium: 3.4 mmol/L — ABNORMAL LOW (ref 3.5–5.1)
Sodium: 137 mmol/L (ref 135–145)
Sodium: 138 mmol/L (ref 135–145)

## 2018-08-12 LAB — CBC
HCT: 30.5 % — ABNORMAL LOW (ref 36.0–46.0)
Hemoglobin: 9.9 g/dL — ABNORMAL LOW (ref 12.0–15.0)
MCH: 29.9 pg (ref 26.0–34.0)
MCHC: 32.5 g/dL (ref 30.0–36.0)
MCV: 92.1 fL (ref 78.0–100.0)
Platelets: 176 10*3/uL (ref 150–400)
RBC: 3.31 MIL/uL — ABNORMAL LOW (ref 3.87–5.11)
RDW: 14 % (ref 11.5–15.5)
WBC: 10.1 10*3/uL (ref 4.0–10.5)

## 2018-08-12 LAB — MAGNESIUM: MAGNESIUM: 2.4 mg/dL (ref 1.7–2.4)

## 2018-08-12 MED ORDER — SENNOSIDES-DOCUSATE SODIUM 8.6-50 MG PO TABS
1.0000 | ORAL_TABLET | Freq: Two times a day (BID) | ORAL | Status: DC | PRN
  Administered 2018-08-12 – 2018-08-18 (×4): 1 via ORAL
  Filled 2018-08-12 (×4): qty 1

## 2018-08-12 MED ORDER — POTASSIUM CHLORIDE CRYS ER 20 MEQ PO TBCR
20.0000 meq | EXTENDED_RELEASE_TABLET | Freq: Once | ORAL | Status: AC
  Administered 2018-08-12: 20 meq via ORAL
  Filled 2018-08-12: qty 1

## 2018-08-12 MED ORDER — HYDROCODONE-ACETAMINOPHEN 10-325 MG PO TABS
1.0000 | ORAL_TABLET | Freq: Three times a day (TID) | ORAL | Status: DC | PRN
  Administered 2018-08-12 – 2018-08-13 (×3): 1 via ORAL
  Filled 2018-08-12 (×3): qty 1

## 2018-08-12 MED ORDER — ALPRAZOLAM 0.5 MG PO TABS
0.5000 mg | ORAL_TABLET | Freq: Every evening | ORAL | Status: DC | PRN
  Administered 2018-08-12 – 2018-08-15 (×2): 0.5 mg via ORAL
  Filled 2018-08-12 (×2): qty 1

## 2018-08-12 NOTE — Progress Notes (Addendum)
Progress Note  Patient Name: [name] [name] Date of Encounter: 08/12/2018  Primary Cardiologist: Kate Sable, MD   Subjective   No chest pain, stated her congestion is breaking up  + NSVT this AM 28 beats or so.  Inpatient Medications    Scheduled Meds: . amLODipine  5 mg Oral QHS  . aspirin EC  81 mg Oral Daily  . gabapentin  300 mg Oral BID  . heparin  5,000 Units Subcutaneous Q8H  . insulin aspart  0-15 Units Subcutaneous TID WC  . latanoprost  1 drop Both Eyes QHS  . levothyroxine  88 mcg Oral QAC breakfast  . simvastatin  20 mg Oral QHS  . sodium chloride flush  3 mL Intravenous Q12H  . sodium chloride flush  3 mL Intravenous Q12H  . timolol  1 drop Both Eyes BID   Continuous Infusions: . sodium chloride    . sodium chloride 50 mL/hr at 08/10/18 2005  . sodium chloride     PRN Meds: sodium chloride, sodium chloride, acetaminophen, albuterol, ALPRAZolam, HYDROcodone-acetaminophen, nitroGLYCERIN, ondansetron (ZOFRAN) IV, sodium chloride flush, sodium chloride flush   Vital Signs    Vitals:   08/11/18 2323 08/12/18 0326 08/12/18 0500 08/12/18 0806  BP: (!) 154/72 (!) 139/56  135/60  Pulse: 71 73  71  Resp: 18 13  17   Temp: 98.3 F (36.8 C) 98.3 F (36.8 C)  97.6 F (36.4 C)  TempSrc: Oral Oral  Oral  SpO2: 99% 99%  100%  Weight:   64.8 kg   Height:        Intake/Output Summary (Last 24 hours) at 08/12/2018 1045 Last data filed at 08/12/2018 0618 Gross per 24 hour  Intake 1564.05 ml  Output 1000 ml  Net 564.05 ml   Filed Weights   08/09/18 1400 08/10/18 1705 08/12/18 0500  Weight: 63.9 kg 63 kg 64.8 kg    Telemetry    SR with NSVT of 28 beats and rare couplet and 3 beats - Personally Reviewed  ECG    No new - Personally Reviewed  Physical Exam   GEN: No acute distress.  Sitting up eating Neck: No JVD sitting up Cardiac: RRR, III/VI murmur, no rubs, or gallops.  Respiratory: Clear but diminished to auscultation bilaterally. GI:  Soft, nontender, non-distended  MS: No edema; No deformity. Neuro:  Nonfocal  Psych: Normal affect   Labs    Chemistry Recent Labs  Lab 08/09/18 1200 08/09/18 2339 08/11/18 0412 08/12/18 0228  NA 139 139 137 137  K 3.7 3.7 4.2 3.4*  CL 105 102 104 105  CO2 25 25 25 25   GLUCOSE 181* 122* 115* 202*  BUN 18 20 23  25*  CREATININE 1.46* 1.72* 1.74* 1.61*  CALCIUM 7.8* 7.8* 7.3* 7.3*  PROT 5.6*  --   --   --   ALBUMIN 2.9*  --   --   --   AST 20  --   --   --   ALT 16  --   --   --   ALKPHOS 69  --   --   --   BILITOT 0.6  --   --   --   GFRNONAA 35* 29* 28* 31*  GFRAA 40* 33* 32* 36*  ANIONGAP 9 12 8 7      Hematology Recent Labs  Lab 08/09/18 1200 08/09/18 2339 08/12/18 0228  WBC 11.7* 12.9* 10.1  RBC 3.54* 3.47* 3.31*  HGB 10.7* 10.4* 9.9*  HCT 32.6* 31.6* 30.5*  MCV 92.1 91.1 92.1  MCH 30.2 30.0 29.9  MCHC 32.8 32.9 32.5  RDW 13.6 13.9 14.0  PLT 159 183 176    Cardiac Enzymes Recent Labs  Lab 08/09/18 1200 08/09/18 1931 08/09/18 2339  TROPONINI 0.14* 0.11* 0.09*   No results for input(s): TROPIPOC in the last 168 hours.   BNP Recent Labs  Lab 08/09/18 1200  BNP 489.1*     DDimer No results for input(s): DDIMER in the last 168 hours.   Radiology    No results found.  Cardiac Studies   Cardiac cath Rt and Lt 08-13-18  Prox RCA lesion is 30% stenosed.  Dist RCA lesion is 30% stenosed.  Ost RPDA to RPDA lesion is 70% stenosed.  Ost Cx to Prox Cx lesion is 20% stenosed.  Ost 2nd Mrg to 2nd Mrg lesion is 20% stenosed.  Prox LAD to Mid LAD lesion is 30% stenosed.  Ost 1st Diag to 1st Diag lesion is 40% stenosed.  Ost Ramus to Ramus lesion is 40% stenosed.   1. Mild to moderate non-obstructive CAD 2. Aortic valve stenosis (peak to peak gradient 16 mmHg, mean 19.5 mmHg, AVA 1.09 cm2). Severe by echo criteria (AVA 0.53 cm2,DVI 0.19. Stroke volume/bsa 19.4). She is felt to have paradoxical low flow/low gradient AS.   Recommendations:  Will continue workup for TAVR. She can probably be discharged home this weekend. Will discuss further testing here in the hospital with our TAVR team. Her CT scans will need to be staged based on her renal function.    Most Recent Value  Fick Cardiac Output 5.36 L/min  Fick Cardiac Output Index 3.48 (L/min)/BSA  Aortic Mean Gradient 19.5 mmHg  Aortic Peak Gradient 16 mmHg  Aortic Valve Area 1.09  Aortic Value Area Index 0.71 cm2/BSA  RA A Wave 11 mmHg  RA V Wave 11 mmHg  RA Mean 8 mmHg  RV Systolic Pressure 40 mmHg  RV Diastolic Pressure 9 mmHg  RV EDP 9 mmHg  PA Systolic Pressure 48 mmHg  PA Diastolic Pressure 5 mmHg  PA Mean 23 mmHg  PW A Wave 24 mmHg  PW V Wave 26 mmHg  PW Mean 23 mmHg  AO Systolic Pressure 818 mmHg  AO Diastolic Pressure 65 mmHg  AO Mean 96 mmHg  LV Systolic Pressure 563 mmHg  LV Diastolic Pressure 15 mmHg  LV EDP 26 mmHg  AOp Systolic Pressure 149 mmHg  AOp Diastolic Pressure 65 mmHg  AOp Mean Pressure 95 mmHg  LVp Systolic Pressure 702 mmHg  LVp Diastolic Pressure 10 mmHg  LVp EDP Pressure 17 mmHg  QP/QS 1  TPVR Index 6.61 HRUI  TSVR Index 27.59 HRUI  TPVR/TSVR Ratio 0.24    Patient Profile     72 y.o. female history of severe AS, HTN, DM, chronic bronchitis, RA and CHF.   Admitted for shortness of breath-cath Aug 13, 2018.  Assessment & Plan    Acute on chronic diastolic HF, with cath as above.  Plan for discharge this weekend.  Continue work up for TAVR.    Severe AS,  Continue TAVR work up  NSVT long run 28 beats will check Mg+ at 1500 and give 20 meq now for K+ 3.4.  Will recheck labs at 1500  HTN  Today 135/60    CKD 3 -- today Cr 1.61 down from 1.74.    Hypokalemia with K+ 3.4.  replacing  Anemia with Hgb of 9.9 was 13 on the 22nd and 10.4 on the 27th.  DM-2  With hgb A1C of 7.8  Glucose is 140 to 196.  glucotrol and metformin held   On SSI now.   Hypothyroid with TSH on the 27th of 116.220  Was on levothyroxine 88 mcg at home   She says she THINKS she is taking  Resume home dose    Asked pt to have family bring in med bottles to confirm   For questions or updates, please contact [place] Please consult [email or web address] for contact info under Cardiology/STEMI.      Signed, Cecilie Kicks, NP  08/12/2018, 10:45 AM     PT seen and examined   I have amended note above by L Ingold to reflect my findings   PT is comfortable    ON exam: In recliner Lungs are CTA   Cardiac RRR   II/VI later peaking systolic murmur   Abd supple   Ext without edema Tele with 28 beat VT   Keep on same regimen  Replete K   Start thyroxiine again  Probable d/c tomorrow with continued work up as outpt (dental extraction).  Dorris Carnes

## 2018-08-13 ENCOUNTER — Inpatient Hospital Stay (HOSPITAL_COMMUNITY): Payer: Medicare HMO

## 2018-08-13 DIAGNOSIS — J42 Unspecified chronic bronchitis: Secondary | ICD-10-CM

## 2018-08-13 DIAGNOSIS — N183 Chronic kidney disease, stage 3 (moderate): Secondary | ICD-10-CM

## 2018-08-13 LAB — BASIC METABOLIC PANEL
ANION GAP: 6 (ref 5–15)
BUN: 26 mg/dL — ABNORMAL HIGH (ref 8–23)
CALCIUM: 8 mg/dL — AB (ref 8.9–10.3)
CO2: 27 mmol/L (ref 22–32)
Chloride: 106 mmol/L (ref 98–111)
Creatinine, Ser: 1.61 mg/dL — ABNORMAL HIGH (ref 0.44–1.00)
GFR calc Af Amer: 36 mL/min — ABNORMAL LOW (ref >60)
GFR, EST NON AFRICAN AMERICAN: 31 mL/min — AB (ref >60)
Glucose, Bld: 236 mg/dL — ABNORMAL HIGH (ref 70–99)
POTASSIUM: 4.9 mmol/L (ref 3.5–5.1)
SODIUM: 139 mmol/L (ref 135–145)

## 2018-08-13 LAB — CBC
HCT: 30.2 % — ABNORMAL LOW (ref 36.0–46.0)
Hemoglobin: 9.7 g/dL — ABNORMAL LOW (ref 12.0–15.0)
MCH: 29.6 pg (ref 26.0–34.0)
MCHC: 32.1 g/dL (ref 30.0–36.0)
MCV: 92.1 fL (ref 78.0–100.0)
Platelets: 191 10*3/uL (ref 150–400)
RBC: 3.28 MIL/uL — ABNORMAL LOW (ref 3.87–5.11)
RDW: 13.8 % (ref 11.5–15.5)
WBC: 8.3 10*3/uL (ref 4.0–10.5)

## 2018-08-13 LAB — GLUCOSE, CAPILLARY
GLUCOSE-CAPILLARY: 141 mg/dL — AB (ref 70–99)
GLUCOSE-CAPILLARY: 237 mg/dL — AB (ref 70–99)
Glucose-Capillary: 201 mg/dL — ABNORMAL HIGH (ref 70–99)
Glucose-Capillary: 197 mg/dL — ABNORMAL HIGH (ref 70–99)

## 2018-08-13 NOTE — Progress Notes (Signed)
Prn breathing tx given in response to audible wheezing. Pt OOB for 3 hours, low energy and RN noted Pt's difficulty in keeping her eyes open. No further pain med given per order. Some speech slurring noted. Episode vomiting after lunch, similar to yesterday. All aspiration precautions in place.

## 2018-08-13 NOTE — Progress Notes (Addendum)
.   Progress Note  Patient Name: [name] [name] Date of Encounter: 08/13/2018   Primary Cardiologist: Dr. Kate Sable  Subjective   Wheezing this morning, no cough or chest pain.  Also complains of intermittent arthritic pain.  Feels weak overall.  Has not moved around very much.  Inpatient Medications    Scheduled Meds: . amLODipine  5 mg Oral QHS  . aspirin EC  81 mg Oral Daily  . gabapentin  300 mg Oral BID  . heparin  5,000 Units Subcutaneous Q8H  . insulin aspart  0-15 Units Subcutaneous TID WC  . latanoprost  1 drop Both Eyes QHS  . levothyroxine  88 mcg Oral QAC breakfast  . simvastatin  20 mg Oral QHS  . sodium chloride flush  3 mL Intravenous Q12H  . sodium chloride flush  3 mL Intravenous Q12H  . timolol  1 drop Both Eyes BID   Continuous Infusions: . sodium chloride    . sodium chloride 50 mL/hr at 08/10/18 2005  . sodium chloride     PRN Meds: sodium chloride, sodium chloride, acetaminophen, albuterol, ALPRAZolam, HYDROcodone-acetaminophen, nitroGLYCERIN, ondansetron (ZOFRAN) IV, senna-docusate, sodium chloride flush, sodium chloride flush   Vital Signs    Vitals:   08/13/18 0334 08/13/18 0500 08/13/18 0846 08/13/18 1000  BP: (!) 130/59  137/80   Pulse: 67  65   Resp: 12  10   Temp: 98.1 F (36.7 C)  97.8 F (36.6 C)   TempSrc: Oral  Oral   SpO2: 94%  98% 94%  Weight:  64 kg    Height:        Intake/Output Summary (Last 24 hours) at 08/13/2018 1050 Last data filed at 08/13/2018 0639 Gross per 24 hour  Intake 480 ml  Output 950 ml  Net -470 ml   Filed Weights   08/10/18 1705 08/12/18 0500 08/13/18 0500  Weight: 63 kg 64.8 kg 64 kg    Telemetry    Sinus rhythm.  Personally reviewed.  ECG    Tracing from 08/11/2018 shows sinus rhythm with IVCD and nonspecific ST changes.  Personally reviewed.  Physical Exam   GEN:  Chronically ill-appearing, wheezing at present. Neck: No JVD. Cardiac: RRR, 3/6 systolic murmur, rub, or gallop.    Respiratory:  Expiratory wheezes. GI: Soft, nontender, bowel sounds present. MS: No edema; No deformity. Neuro:  Nonfocal. Psych: Alert and oriented x 3.  Sleepy but arousable.  Labs    Chemistry Recent Labs  Lab 08/09/18 1200  08/12/18 0228 08/12/18 1436 08/13/18 0227  NA 139   < > 137 138 139  K 3.7   < > 3.4* 3.5 4.9  CL 105   < > 105 105 106  CO2 25   < > 25 26 27   GLUCOSE 181*   < > 202* 277* 236*  BUN 18   < > 25* 21 26*  CREATININE 1.46*   < > 1.61* 1.40* 1.61*  CALCIUM 7.8*   < > 7.3* 8.0* 8.0*  PROT 5.6*  --   --   --   --   ALBUMIN 2.9*  --   --   --   --   AST 20  --   --   --   --   ALT 16  --   --   --   --   ALKPHOS 69  --   --   --   --   BILITOT 0.6  --   --   --   --  GFRNONAA 35*   < > 31* 36* 31*  GFRAA 40*   < > 36* 42* 36*  ANIONGAP 9   < > 7 7 6    < > = values in this interval not displayed.     Hematology Recent Labs  Lab 08/09/18 2339 08/12/18 0228 08/13/18 0227  WBC 12.9* 10.1 8.3  RBC 3.47* 3.31* 3.28*  HGB 10.4* 9.9* 9.7*  HCT 31.6* 30.5* 30.2*  MCV 91.1 92.1 92.1  MCH 30.0 29.9 29.6  MCHC 32.9 32.5 32.1  RDW 13.9 14.0 13.8  PLT 183 176 191    Cardiac Enzymes Recent Labs  Lab 08/09/18 1200 08/09/18 1931 08/09/18 2339  TROPONINI 0.14* 0.11* 0.09*   No results for input(s): TROPIPOC in the last 168 hours.   BNP Recent Labs  Lab 08/09/18 1200  BNP 489.1*     Radiology    Chest x-ray 08/09/2018: FINDINGS: The lungs are reasonably well inflated. The interstitial markings are less conspicuous but remain increased. The cardiac silhouette remains enlarged. The pulmonary vascularity remains engorged but is more distinct. There is calcification in the wall of the aortic arch. The bony thorax exhibits no acute abnormality.  IMPRESSION: CHF with decreased pulmonary interstitial edema. Stable cardiomegaly.  Thoracic aortic atherosclerosis.  Cardiac Studies   Echocardiogram 07/13/2018: Study Conclusions  - Left  ventricle: The cavity size was normal. Wall thickness was   increased increased in a pattern of mild to moderate LVH.   Systolic function was normal. The estimated ejection fraction was   in the range of 50% to 55%. Wall motion was normal; there were no   regional wall motion abnormalities. Doppler parameters are   consistent with abnormal left ventricular relaxation (grade 1   diastolic dysfunction). - Aortic valve: Mildly to moderately calcified annulus. Probably   trileaflet; moderately calcified leaflets. There was mild   regurgitation. Mean gradient (S): 20 mm Hg. Peak gradient (S): 33   mm Hg. VTI ratio of LVOT to aortic valve: 0.19. Possible severe,   low-gradient aortic stenosis. Valve area (VTI): 0.53 cm^2. Valve   area (Vmax): 0.62 cm^2. Valve area (Vmean): 0.54 cm^2. - Mitral valve: Mildly calcified leaflets. There was mild   regurgitation. - Left atrium: The atrium was at the upper limits of normal in   size. - Right atrium: Central venous pressure (est): 3 mm Hg. - Atrial septum: No defect or patent foramen ovale was identified. - Tricuspid valve: There was mild regurgitation. - Pulmonary arteries: PA peak pressure: 15 mm Hg (S). - Pericardium, extracardiac: There was no pericardial effusion.  Cardiac catheterization 08/11/2018:  Prox RCA lesion is 30% stenosed.  Dist RCA lesion is 30% stenosed.  Ost RPDA to RPDA lesion is 70% stenosed.  Ost Cx to Prox Cx lesion is 20% stenosed.  Ost 2nd Mrg to 2nd Mrg lesion is 20% stenosed.  Prox LAD to Mid LAD lesion is 30% stenosed.  Ost 1st Diag to 1st Diag lesion is 40% stenosed.  Ost Ramus to Ramus lesion is 40% stenosed.   1. Mild to moderate non-obstructive CAD 2. Aortic valve stenosis (peak to peak gradient 16 mmHg, mean 19.5 mmHg, AVA 1.09 cm2). Severe by echo criteria (AVA 0.53 cm2,DVI 0.19. Stroke volume/bsa 19.4). She is felt to have paradoxical low flow/low gradient AS.   Recommendations: Will continue workup  for TAVR. She can probably be discharged home this weekend. Will discuss further testing here in the hospital with our TAVR team. Her CT scans will need to be  staged based on her renal function.   Patient Profile     72 y.o. female with a history of hypertension, type 2 diabetes mellitus, chronic bronchitis, rheumatoid arthritis, and severe low gradient aortic stenosis by recent work-up.  Also mild to moderate nonobstructive CAD.  Plan is further evaluation for TAVR.  Assessment & Plan    1.  Severe low gradient aortic stenosis by recent work-up with TAVR evaluation ongoing.  2.  Severe hypothyroidism, TSH 116 recently.  Not certain about compliance with levothyroxine as an outpatient.  3.  Acute on chronic diastolic heart failure, LVEF 50 to 55% range.  Taken output not complete.  Weight has been stable.  Chest x-ray from August 27 showed pulmonary interstitial edema.  4.  Chronic bronchitis, wheezing on examination.  5.  Mild to moderate nonobstructive CAD.  On aspirin and statin.  6.  Blood loss anemia, hemoglobin stable at 9.7.  7. CKD stage 3, creatinine 1.4.  8.  NSVT, quiescent over the last 24 hours.  9.  Chronic pain.  Continue aspirin, Zocor, and Norvasc.  Continue outpatient dose of levothyroxine which was reinitiated.  Follow-up PA and lateral chest x-ray.  She is getting as needed nebulizer treatments, may need short course of steroids.  Also not on standing diuretic, if she still has pulmonary edema may need to resume.  Reduce use of Norco, try to increase activity and get up to chair during the day.  She is not ready for discharge home at this time.  Signed, Rozann Lesches, MD  08/13/2018, 10:50 AM

## 2018-08-14 LAB — BASIC METABOLIC PANEL
Anion gap: 8 (ref 5–15)
BUN: 39 mg/dL — AB (ref 8–23)
CHLORIDE: 103 mmol/L (ref 98–111)
CO2: 26 mmol/L (ref 22–32)
CREATININE: 2.44 mg/dL — AB (ref 0.44–1.00)
Calcium: 8.4 mg/dL — ABNORMAL LOW (ref 8.9–10.3)
GFR calc non Af Amer: 19 mL/min — ABNORMAL LOW (ref >60)
GFR, EST AFRICAN AMERICAN: 21 mL/min — AB (ref >60)
Glucose, Bld: 180 mg/dL — ABNORMAL HIGH (ref 70–99)
Potassium: 5.2 mmol/L — ABNORMAL HIGH (ref 3.5–5.1)
Sodium: 137 mmol/L (ref 135–145)

## 2018-08-14 LAB — GLUCOSE, CAPILLARY
GLUCOSE-CAPILLARY: 281 mg/dL — AB (ref 70–99)
GLUCOSE-CAPILLARY: 426 mg/dL — AB (ref 70–99)
Glucose-Capillary: 209 mg/dL — ABNORMAL HIGH (ref 70–99)
Glucose-Capillary: 381 mg/dL — ABNORMAL HIGH (ref 70–99)

## 2018-08-14 LAB — URINALYSIS, ROUTINE W REFLEX MICROSCOPIC
BILIRUBIN URINE: NEGATIVE
Bacteria, UA: NONE SEEN
Glucose, UA: >=500 mg/dL — AB
Hgb urine dipstick: NEGATIVE
KETONES UR: NEGATIVE mg/dL
LEUKOCYTES UA: NEGATIVE
Nitrite: NEGATIVE
PROTEIN: 100 mg/dL — AB
Specific Gravity, Urine: 1.008 (ref 1.005–1.030)
pH: 6 (ref 5.0–8.0)

## 2018-08-14 LAB — CBC
HEMATOCRIT: 30.8 % — AB (ref 36.0–46.0)
Hemoglobin: 9.9 g/dL — ABNORMAL LOW (ref 12.0–15.0)
MCH: 30.3 pg (ref 26.0–34.0)
MCHC: 32.1 g/dL (ref 30.0–36.0)
MCV: 94.2 fL (ref 78.0–100.0)
PLATELETS: 201 10*3/uL (ref 150–400)
RBC: 3.27 MIL/uL — AB (ref 3.87–5.11)
RDW: 14.1 % (ref 11.5–15.5)
WBC: 8.2 10*3/uL (ref 4.0–10.5)

## 2018-08-14 MED ORDER — BISACODYL 10 MG RE SUPP
10.0000 mg | Freq: Every day | RECTAL | Status: DC | PRN
  Administered 2018-08-15: 10 mg via RECTAL
  Filled 2018-08-14: qty 1

## 2018-08-14 MED ORDER — FLEET ENEMA 7-19 GM/118ML RE ENEM
1.0000 | ENEMA | RECTAL | Status: AC
  Administered 2018-08-14: 1 via RECTAL
  Filled 2018-08-14: qty 1

## 2018-08-14 MED ORDER — PREDNISONE 20 MG PO TABS
40.0000 mg | ORAL_TABLET | Freq: Every day | ORAL | Status: DC
  Administered 2018-08-14 – 2018-08-15 (×2): 40 mg via ORAL
  Filled 2018-08-14 (×2): qty 2

## 2018-08-14 NOTE — Progress Notes (Signed)
Patient's CBG >400.  MD notified and maximum dose of insulin on scale ordered.  Patient to receive diabetic counciling.  Daughters have been bringing in foods high in sugar and carbohydrates for her to eat in lieu of her hospital diet.  She is not receptive to teaching by staff and has poor understanding of diabetes and how it affects aspects of your life / physical health and well-being.  It appears that family also have similar views.

## 2018-08-14 NOTE — Progress Notes (Signed)
Progress Note  Patient Name: [name] [name] Date of Encounter: 08/14/2018  Primary Cardiologist: Dr. Kate Sable  Subjective   Patient had episodes of emesis after eating yesterday.  States that she had some improvement in shortness of breath with breathing treatment last night.  No active chest pain.  Still feels weak.  Also decreased urine output.  Inpatient Medications    Scheduled Meds: . amLODipine  5 mg Oral QHS  . aspirin EC  81 mg Oral Daily  . gabapentin  300 mg Oral BID  . heparin  5,000 Units Subcutaneous Q8H  . insulin aspart  0-15 Units Subcutaneous TID WC  . latanoprost  1 drop Both Eyes QHS  . levothyroxine  88 mcg Oral QAC breakfast  . simvastatin  20 mg Oral QHS  . sodium chloride flush  3 mL Intravenous Q12H  . sodium chloride flush  3 mL Intravenous Q12H  . timolol  1 drop Both Eyes BID   Continuous Infusions: . sodium chloride    . sodium chloride 50 mL/hr at 08/10/18 2005  . sodium chloride     PRN Meds: sodium chloride, sodium chloride, acetaminophen, albuterol, ALPRAZolam, nitroGLYCERIN, ondansetron (ZOFRAN) IV, senna-docusate, sodium chloride flush, sodium chloride flush   Vital Signs    Vitals:   08/13/18 2347 08/14/18 0229 08/14/18 0500 08/14/18 0851  BP: (!) 141/58  139/72 (!) 145/87  Pulse: 69  70 70  Resp: 11  10 10   Temp: 98.3 F (36.8 C)  98 F (36.7 C)   TempSrc: Oral  Oral   SpO2: 98% 99% 98% 100%  Weight:   65.6 kg   Height:        Intake/Output Summary (Last 24 hours) at 08/14/2018 1013 Last data filed at 08/14/2018 0600 Gross per 24 hour  Intake 600 ml  Output 378 ml  Net 222 ml   Filed Weights   08/12/18 0500 08/13/18 0500 08/14/18 0500  Weight: 64.8 kg 64 kg 65.6 kg    Telemetry    Sinus rhythm.  Personally reviewed.  ECG    Tracing from 08/11/2018 shows sinus rhythm with IVCD and nonspecific ST changes.  Personally reviewed.  Physical Exam   GEN:  Chronically ill-appearing woman.  No acute distress.     Neck: No JVD. Cardiac: RRR, 3/6 systolic murmur, no gallop.  Respiratory:  Prolonged expiratory phase with upper airway wheezing.Marland Kitchen GI: Soft, nontender, bowel sounds present. MS: No edema; No deformity. Neuro:  Nonfocal.  Impaired vision at baseline. Psych: Alert and oriented x 3.  Sleepy but arousable and conversant appropriately.  Labs    Chemistry Recent Labs  Lab 08/09/18 1200  08/12/18 1436 08/13/18 0227 08/14/18 0216  NA 139   < > 138 139 137  K 3.7   < > 3.5 4.9 5.2*  CL 105   < > 105 106 103  CO2 25   < > 26 27 26   GLUCOSE 181*   < > 277* 236* 180*  BUN 18   < > 21 26* 39*  CREATININE 1.46*   < > 1.40* 1.61* 2.44*  CALCIUM 7.8*   < > 8.0* 8.0* 8.4*  PROT 5.6*  --   --   --   --   ALBUMIN 2.9*  --   --   --   --   AST 20  --   --   --   --   ALT 16  --   --   --   --  ALKPHOS 69  --   --   --   --   BILITOT 0.6  --   --   --   --   GFRNONAA 35*   < > 36* 31* 19*  GFRAA 40*   < > 42* 36* 21*  ANIONGAP 9   < > 7 6 8    < > = values in this interval not displayed.     Hematology Recent Labs  Lab 08/12/18 0228 08/13/18 0227 08/14/18 0216  WBC 10.1 8.3 8.2  RBC 3.31* 3.28* 3.27*  HGB 9.9* 9.7* 9.9*  HCT 30.5* 30.2* 30.8*  MCV 92.1 92.1 94.2  MCH 29.9 29.6 30.3  MCHC 32.5 32.1 32.1  RDW 14.0 13.8 14.1  PLT 176 191 201    Cardiac Enzymes Recent Labs  Lab 08/09/18 1200 08/09/18 1931 08/09/18 2339  TROPONINI 0.14* 0.11* 0.09*   No results for input(s): TROPIPOC in the last 168 hours.   BNP Recent Labs  Lab 08/09/18 1200  BNP 489.1*     Radiology    Dg Chest 2 View  Result Date: 08/13/2018 CLINICAL DATA:  72 year old female with lethargy and wheezing EXAM: CHEST - 2 VIEW COMPARISON:  Prior chest x-ray 08/09/2018 FINDINGS: Cardiomegaly with left heart enlargement. Atherosclerotic calcifications present in the transverse aorta. Mild vascular congestion but no overt pulmonary edema. No pleural effusion, pneumothorax or focal airspace consolidation.  Pulmonary aeration is improved compared to the most recent prior chest x-ray from 08/09/2018. Chronic background bronchitic changes. No acute osseous abnormality. IMPRESSION: 1. No acute cardiopulmonary process. 2. Stable cardiomegaly with left heart enlargement. Electronically Signed   By: Jacqulynn Cadet M.D.   On: 08/13/2018 16:22    Cardiac Studies   Echocardiogram 07/13/2018: Study Conclusions  - Left ventricle: The cavity size was normal. Wall thickness was increased increased in a pattern of mild to moderate LVH. Systolic function was normal. The estimated ejection fraction was in the range of 50% to 55%. Wall motion was normal; there were no regional wall motion abnormalities. Doppler parameters are consistent with abnormal left ventricular relaxation (grade 1 diastolic dysfunction). - Aortic valve: Mildly to moderately calcified annulus. Probably trileaflet; moderately calcified leaflets. There was mild regurgitation. Mean gradient (S): 20 mm Hg. Peak gradient (S): 33 mm Hg. VTI ratio of LVOT to aortic valve: 0.19. Possible severe, low-gradient aortic stenosis. Valve area (VTI): 0.53 cm^2. Valve area (Vmax): 0.62 cm^2. Valve area (Vmean): 0.54 cm^2. - Mitral valve: Mildly calcified leaflets. There was mild regurgitation. - Left atrium: The atrium was at the upper limits of normal in size. - Right atrium: Central venous pressure (est): 3 mm Hg. - Atrial septum: No defect or patent foramen ovale was identified. - Tricuspid valve: There was mild regurgitation. - Pulmonary arteries: PA peak pressure: 15 mm Hg (S). - Pericardium, extracardiac: There was no pericardial effusion.  Cardiac catheterization 08/11/2018:  Prox RCA lesion is 30% stenosed.  Dist RCA lesion is 30% stenosed.  Ost RPDA to RPDA lesion is 70% stenosed.  Ost Cx to Prox Cx lesion is 20% stenosed.  Ost 2nd Mrg to 2nd Mrg lesion is 20% stenosed.  Prox LAD to Mid LAD lesion is  30% stenosed.  Ost 1st Diag to 1st Diag lesion is 40% stenosed.  Ost Ramus to Ramus lesion is 40% stenosed.  1. Mild to moderate non-obstructive CAD 2. Aortic valve stenosis (peak to peak gradient 16 mmHg, mean 19.5 mmHg, AVA 1.09 cm2). Severe by echo criteria (AVA 0.53 cm2,DVI 0.19. Stroke  volume/bsa 19.4). She is felt to have paradoxical low flow/low gradient AS.   Recommendations: Will continue workup for TAVR. She can probably be discharged home this weekend. Will discuss further testing here in the hospital with our TAVR team. Her CT scans will need to be staged based on her renal function.   Patient Profile     72 y.o. female with a history of hypertension, type 2 diabetes mellitus, chronic bronchitis, rheumatoid arthritis, and severe low gradient aortic stenosis by recent work-up.  Also mild to moderate nonobstructive CAD.  Plan is further evaluation for TAVR.  Assessment & Plan    1.  Severe low gradient aortic stenosis by recent work-up with TAVR evaluation ongoing.  2.  Severe hypothyroidism, TSH 116 recently.  Outpatient compliance uncertain, levothyroxine has been resumed at previous dose.  3.  Acute on chronic diastolic heart failure, LVEF 50 to 55%.  Follow-up chest x-ray showed no edema.  Really not on diuretic.  4.  Decreased urine output last 24 hours.  No complaints of dysuria.  Did have some nausea after meals yesterday.  Afebrile.  300 cc residual noted on bladder scan.  5.  Chronic bronchitis.  6.  Mild to moderate nonobstructive CAD by recent cardiac catheterization.  Remains on aspirin and statin therapy.  7.  Blood loss anemia, hemoglobin stable at 9.9.  8.  CKD stage III, creatinine up to 1.6.  9.  NSVT, none recently last 24 to 48 hours.  Discussed with nursing.  Would place Foley catheter and send urinalysis.  Patient also plans to have enema today with no recent bowel movement.  Add prednisone to current regimen in light of wheezing and continue with  nebulizer treatments.  Chest x-ray showed no acute process.  Norco has been discontinued within the last 24 hours.  She is likely going to need PT OT assessment prior to discharge home.  Signed, Rozann Lesches, MD  08/14/2018, 10:13 AM

## 2018-08-14 NOTE — Progress Notes (Signed)
Fleets enema given PR with resulting small hard stool.  Patient tolerated well / now on commode after disimpaction for further small amounts of very hard stool.  Passing large amts of gas.

## 2018-08-14 NOTE — Progress Notes (Signed)
   08/14/18 1000  Clinical Encounter Type  Visited With Patient and family together  Visit Type Spiritual support  Referral From Nurse  Consult/Referral To Chaplain  Spiritual Encounters  Spiritual Needs Prayer  Met with Pt. And she was resting with daughter sleeping in chair next to bed. Prayed with Pt. and informed her that Spiritual care is available 24/7.

## 2018-08-15 LAB — CBC
HEMATOCRIT: 30 % — AB (ref 36.0–46.0)
Hemoglobin: 9.7 g/dL — ABNORMAL LOW (ref 12.0–15.0)
MCH: 29.6 pg (ref 26.0–34.0)
MCHC: 32.3 g/dL (ref 30.0–36.0)
MCV: 91.5 fL (ref 78.0–100.0)
Platelets: 207 10*3/uL (ref 150–400)
RBC: 3.28 MIL/uL — ABNORMAL LOW (ref 3.87–5.11)
RDW: 13.3 % (ref 11.5–15.5)
WBC: 8.6 10*3/uL (ref 4.0–10.5)

## 2018-08-15 LAB — BASIC METABOLIC PANEL
Anion gap: 8 (ref 5–15)
BUN: 34 mg/dL — AB (ref 8–23)
CO2: 26 mmol/L (ref 22–32)
Calcium: 8.1 mg/dL — ABNORMAL LOW (ref 8.9–10.3)
Chloride: 102 mmol/L (ref 98–111)
Creatinine, Ser: 2.02 mg/dL — ABNORMAL HIGH (ref 0.44–1.00)
GFR calc Af Amer: 27 mL/min — ABNORMAL LOW (ref >60)
GFR, EST NON AFRICAN AMERICAN: 23 mL/min — AB (ref >60)
GLUCOSE: 313 mg/dL — AB (ref 70–99)
POTASSIUM: 4.9 mmol/L (ref 3.5–5.1)
Sodium: 136 mmol/L (ref 135–145)

## 2018-08-15 LAB — GLUCOSE, CAPILLARY
GLUCOSE-CAPILLARY: 441 mg/dL — AB (ref 70–99)
Glucose-Capillary: 258 mg/dL — ABNORMAL HIGH (ref 70–99)
Glucose-Capillary: 335 mg/dL — ABNORMAL HIGH (ref 70–99)
Glucose-Capillary: 290 mg/dL — ABNORMAL HIGH (ref 70–99)

## 2018-08-15 MED ORDER — GLIPIZIDE ER 10 MG PO TB24
10.0000 mg | ORAL_TABLET | Freq: Every day | ORAL | Status: DC
  Administered 2018-08-15 – 2018-08-16 (×2): 10 mg via ORAL
  Filled 2018-08-15 (×3): qty 1

## 2018-08-15 MED ORDER — PREDNISONE 20 MG PO TABS
20.0000 mg | ORAL_TABLET | Freq: Every day | ORAL | Status: AC
  Administered 2018-08-16: 20 mg via ORAL
  Filled 2018-08-15: qty 1

## 2018-08-15 MED ORDER — INSULIN ASPART 100 UNIT/ML ~~LOC~~ SOLN
0.0000 [IU] | Freq: Every day | SUBCUTANEOUS | Status: DC
  Administered 2018-08-15: 5 [IU] via SUBCUTANEOUS
  Administered 2018-08-16: 3 [IU] via SUBCUTANEOUS
  Administered 2018-08-17: 2 [IU] via SUBCUTANEOUS

## 2018-08-15 MED ORDER — METFORMIN HCL 500 MG PO TABS
1000.0000 mg | ORAL_TABLET | Freq: Two times a day (BID) | ORAL | Status: DC
  Administered 2018-08-15 – 2018-08-18 (×6): 1000 mg via ORAL
  Filled 2018-08-15 (×6): qty 2

## 2018-08-15 MED ORDER — PREDNISONE 10 MG PO TABS
10.0000 mg | ORAL_TABLET | Freq: Every day | ORAL | Status: DC
  Administered 2018-08-18: 10 mg via ORAL
  Filled 2018-08-15: qty 1

## 2018-08-15 MED ORDER — INSULIN ASPART 100 UNIT/ML ~~LOC~~ SOLN
0.0000 [IU] | Freq: Three times a day (TID) | SUBCUTANEOUS | Status: DC
  Administered 2018-08-16 (×2): 3 [IU] via SUBCUTANEOUS
  Administered 2018-08-16: 7 [IU] via SUBCUTANEOUS
  Administered 2018-08-17: 2 [IU] via SUBCUTANEOUS
  Administered 2018-08-17: 7 [IU] via SUBCUTANEOUS
  Administered 2018-08-18: 2 [IU] via SUBCUTANEOUS

## 2018-08-15 MED ORDER — PREDNISONE 10 MG PO TABS: 10.0000 mg | ORAL_TABLET | Freq: Once | ORAL | Status: DC

## 2018-08-15 NOTE — Evaluation (Signed)
Physical Therapy Evaluation Patient Details Name: [name] [name] MRN: [id number] DOB: 1946-05-30 Today's Date: 08/15/2018   History of Present Illness  72 yo female with history of hypertension, HLD, hypothyroidism, diabetes, chronic bronchitis, rheumatoid arthritis and severe aortic stenosis who was initially referred to the valve clinic by Dr. Bronson Ing for further discussion regarding her aortic stenosis and possible TAVR.   Clinical Impression  Pt indep with RW at home. Pt now with instability, increased falls risk, and requiring increased assistance for safe mobility. Per daughter pt is weaker than PTA. Pt to benefit from HHPT to improved strength and balance to decrease falls risk. Acute PT to cont to follow.    Follow Up Recommendations Home health PT;Supervision/Assistance - 24 hour    Equipment Recommendations  None recommended by PT    Recommendations for Other Services       Precautions / Restrictions Precautions Precautions: Fall Restrictions Weight Bearing Restrictions: No      Mobility  Bed Mobility Overal bed mobility: Needs Assistance Bed Mobility: Supine to Sit;Sit to Supine     Supine to sit: Min guard Sit to supine: Min guard   General bed mobility comments: increased time, hob elevated, mild labored effort, pt did fall back onto bed when sitting EOB, pt able to bring LE back up in to bed. pt with difficulty sliding her self up to head of bed  Transfers Overall transfer level: Needs assistance Equipment used: None Transfers: Sit to/from Stand Sit to Stand: Min guard;Min assist         General transfer comment: pt with lateral say, unsteady, requiring minA especially when not using a walker  Ambulation/Gait Ambulation/Gait assistance: Min assist Gait Distance (Feet): 20 Feet(x2) Assistive device: Rolling walker (2 wheeled);None Gait Pattern/deviations: Step-through pattern;Decreased stride length;Staggering left;Drifts right/left Gait  velocity: slow   General Gait Details: pt ambulation limited to room due to on contact for bed bugs. Pt first amb without AD, pt very unsteady with lateral sway L/R requiring minA to maintain balance. Pt then amb with RW, pt wiht increased stability no lateral sway. minA for walker management during turns  Stairs            Wheelchair Mobility    Modified Rankin (Stroke Patients Only)       Balance Overall balance assessment: Needs assistance Sitting-balance support: Feet supported;Bilateral upper extremity supported Sitting balance-Leahy Scale: Fair     Standing balance support: No upper extremity supported Standing balance-Leahy Scale: Zero Standing balance comment: pt unsafe with UE support                             Pertinent Vitals/Pain Pain Assessment: No/denies pain    Home Living Family/patient expects to be discharged to:: Private residence Living Arrangements: Children Available Help at Discharge: Family;Available 24 hours/day Type of Home: House Home Access: Stairs to enter Entrance Stairs-Rails: None Entrance Stairs-Number of Steps: 2(small steps) Home Layout: One level Home Equipment: Walker - 4 wheels;Cane - quad;Cane - single point;Shower seat      Prior Function Level of Independence: Independent with assistive device(s)         Comments: pt reports furniture walking and sitting down on chair in shower, uses rollator majority of time as well     Hand Dominance   Dominant Hand: Right    Extremity/Trunk Assessment   Upper Extremity Assessment Upper Extremity Assessment: Generalized weakness    Lower Extremity Assessment Lower Extremity Assessment: Generalized  weakness    Cervical / Trunk Assessment Cervical / Trunk Assessment: Kyphotic  Communication   Communication: No difficulties  Cognition Arousal/Alertness: Awake/alert(was sleeping but easily awoken) Behavior During Therapy: Flat affect Overall Cognitive Status:  Within Functional Limits for tasks assessed                                 General Comments: however pt not receptive to education on diabetes and how to manage diet      General Comments      Exercises     Assessment/Plan    PT Assessment Patient needs continued PT services  PT Problem List Decreased strength;Decreased range of motion;Decreased activity tolerance;Decreased balance;Decreased coordination;Decreased mobility;Decreased knowledge of use of DME;Decreased safety awareness       PT Treatment Interventions DME instruction;Gait training;Stair training;Functional mobility training;Therapeutic activities;Therapeutic exercise;Balance training;Neuromuscular re-education    PT Goals (Current goals can be found in the Care Plan section)  Acute Rehab PT Goals Patient Stated Goal: home PT Goal Formulation: With patient Time For Goal Achievement: 08/29/18 Potential to Achieve Goals: Good    Frequency Min 3X/week   Barriers to discharge        Co-evaluation               AM-PAC PT "6 Clicks" Daily Activity  Outcome Measure Difficulty turning over in bed (including adjusting bedclothes, sheets and blankets)?: A Little Difficulty moving from lying on back to sitting on the side of the bed? : A Little Difficulty sitting down on and standing up from a chair with arms (e.g., wheelchair, bedside commode, etc,.)?: A Little Help needed moving to and from a bed to chair (including a wheelchair)?: A Little Help needed walking in hospital room?: A Little Help needed climbing 3-5 steps with a railing? : A Little 6 Click Score: 18    End of Session   Activity Tolerance: Patient tolerated treatment well Patient left: in bed;with call bell/phone within reach;with family/visitor present Nurse Communication: Mobility status PT Visit Diagnosis: Unsteadiness on feet (R26.81)    Time: 0964-3838 PT Time Calculation (min) (ACUTE ONLY): 19 min   Charges:   PT  Evaluation $PT Eval Moderate Complexity: 1 Mod          Kittie Plater, PT, DPT Acute Rehabilitation Services Pager #: [phone] Office #: [phone]   Berline Lopes 08/15/2018, 12:40 PM

## 2018-08-15 NOTE — Progress Notes (Signed)
Progress Note  Patient Name: [name] [name] Date of Encounter: 08/15/2018  Primary Cardiologist: Dr. Kate Sable  Subjective   Breathing is OK   Was wheezing earlier she says   Better with bringing treatment    Inpatient Medications    Scheduled Meds: . amLODipine  5 mg Oral QHS  . aspirin EC  81 mg Oral Daily  . gabapentin  300 mg Oral BID  . heparin  5,000 Units Subcutaneous Q8H  . insulin aspart  0-15 Units Subcutaneous TID WC  . latanoprost  1 drop Both Eyes QHS  . levothyroxine  88 mcg Oral QAC breakfast  . predniSONE  40 mg Oral Q breakfast  . simvastatin  20 mg Oral QHS  . sodium chloride flush  3 mL Intravenous Q12H  . timolol  1 drop Both Eyes BID   Continuous Infusions: . sodium chloride     PRN Meds: sodium chloride, acetaminophen, albuterol, ALPRAZolam, bisacodyl, nitroGLYCERIN, ondansetron (ZOFRAN) IV, senna-docusate, sodium chloride flush   Vital Signs    Vitals:   08/14/18 2054 08/14/18 2148 08/15/18 0000 08/15/18 0349  BP: (!) 149/73 (!) 166/76 (!) 158/76 (!) 149/79  Pulse: 73  76 69  Resp: 14  18 13   Temp: 97.8 F (36.6 C)  98.1 F (36.7 C) 97.8 F (36.6 C)  TempSrc: Oral  Oral Oral  SpO2: 100%  100% 94%  Weight:    65.2 kg  Height:        Intake/Output Summary (Last 24 hours) at 08/15/2018 0824 Last data filed at 08/15/2018 0200 Gross per 24 hour  Intake 1467 ml  Output 1575 ml  Net -108 ml   Filed Weights   08/13/18 0500 08/14/18 0500 08/15/18 0349  Weight: 64 kg 65.6 kg 65.2 kg    Telemetry    SR  Personally reviewed.  ECG    NOne  Personally reviewed.  Physical Exam   GEN:  Chronically ill-appearing woman.  No acute distress.   Neck: No JVD. Cardiac: RRR, III/VI systoliic murmur, no gallop.  Respiratory:  Moving air OK   No wheezes  GI: Soft, nontender, bowel sounds present. MS: No edema; No deformity. Neuro:  Nonfocal.  \ Psych: Alert and oriented x 3.    Labs    Chemistry Recent Labs  Lab 08/09/18 1200   08/13/18 0227 08/14/18 0216 08/15/18 0328  NA 139   < > 139 137 136  K 3.7   < > 4.9 5.2* 4.9  CL 105   < > 106 103 102  CO2 25   < > 27 26 26   GLUCOSE 181*   < > 236* 180* 313*  BUN 18   < > 26* 39* 34*  CREATININE 1.46*   < > 1.61* 2.44* 2.02*  CALCIUM 7.8*   < > 8.0* 8.4* 8.1*  PROT 5.6*  --   --   --   --   ALBUMIN 2.9*  --   --   --   --   AST 20  --   --   --   --   ALT 16  --   --   --   --   ALKPHOS 69  --   --   --   --   BILITOT 0.6  --   --   --   --   GFRNONAA 35*   < > 31* 19* 23*  GFRAA 40*   < > 36* 21* 27*  ANIONGAP 9   < >  6 8 8    < > = values in this interval not displayed.     Hematology Recent Labs  Lab 08/13/18 0227 08/14/18 0216 08/15/18 0328  WBC 8.3 8.2 8.6  RBC 3.28* 3.27* 3.28*  HGB 9.7* 9.9* 9.7*  HCT 30.2* 30.8* 30.0*  MCV 92.1 94.2 91.5  MCH 29.6 30.3 29.6  MCHC 32.1 32.1 32.3  RDW 13.8 14.1 13.3  PLT 191 201 207    Cardiac Enzymes Recent Labs  Lab 08/09/18 1200 08/09/18 1931 08/09/18 2339  TROPONINI 0.14* 0.11* 0.09*   No results for input(s): TROPIPOC in the last 168 hours.   BNP Recent Labs  Lab 08/09/18 1200  BNP 489.1*     Radiology    Dg Chest 2 View  Result Date: 08/13/2018 CLINICAL DATA:  72 year old female with lethargy and wheezing EXAM: CHEST - 2 VIEW COMPARISON:  Prior chest x-ray 08/09/2018 FINDINGS: Cardiomegaly with left heart enlargement. Atherosclerotic calcifications present in the transverse aorta. Mild vascular congestion but no overt pulmonary edema. No pleural effusion, pneumothorax or focal airspace consolidation. Pulmonary aeration is improved compared to the most recent prior chest x-ray from 08/09/2018. Chronic background bronchitic changes. No acute osseous abnormality. IMPRESSION: 1. No acute cardiopulmonary process. 2. Stable cardiomegaly with left heart enlargement. Electronically Signed   By: Jacqulynn Cadet M.D.   On: 08/13/2018 16:22    Cardiac Studies   Echocardiogram 07/13/2018: Study  Conclusions  - Left ventricle: The cavity size was normal. Wall thickness was increased increased in a pattern of mild to moderate LVH. Systolic function was normal. The estimated ejection fraction was in the range of 50% to 55%. Wall motion was normal; there were no regional wall motion abnormalities. Doppler parameters are consistent with abnormal left ventricular relaxation (grade 1 diastolic dysfunction). - Aortic valve: Mildly to moderately calcified annulus. Probably trileaflet; moderately calcified leaflets. There was mild regurgitation. Mean gradient (S): 20 mm Hg. Peak gradient (S): 33 mm Hg. VTI ratio of LVOT to aortic valve: 0.19. Possible severe, low-gradient aortic stenosis. Valve area (VTI): 0.53 cm^2. Valve area (Vmax): 0.62 cm^2. Valve area (Vmean): 0.54 cm^2. - Mitral valve: Mildly calcified leaflets. There was mild regurgitation. - Left atrium: The atrium was at the upper limits of normal in size. - Right atrium: Central venous pressure (est): 3 mm Hg. - Atrial septum: No defect or patent foramen ovale was identified. - Tricuspid valve: There was mild regurgitation. - Pulmonary arteries: PA peak pressure: 15 mm Hg (S). - Pericardium, extracardiac: There was no pericardial effusion.  Cardiac catheterization 08/11/2018:  Prox RCA lesion is 30% stenosed.  Dist RCA lesion is 30% stenosed.  Ost RPDA to RPDA lesion is 70% stenosed.  Ost Cx to Prox Cx lesion is 20% stenosed.  Ost 2nd Mrg to 2nd Mrg lesion is 20% stenosed.  Prox LAD to Mid LAD lesion is 30% stenosed.  Ost 1st Diag to 1st Diag lesion is 40% stenosed.  Ost Ramus to Ramus lesion is 40% stenosed.  1. Mild to moderate non-obstructive CAD 2. Aortic valve stenosis (peak to peak gradient 16 mmHg, mean 19.5 mmHg, AVA 1.09 cm2). Severe by echo criteria (AVA 0.53 cm2,DVI 0.19. Stroke volume/bsa 19.4). She is felt to have paradoxical low flow/low gradient AS.    Recommendations: Will continue workup for TAVR. She can probably be discharged home this weekend. Will discuss further testing here in the hospital with our TAVR team. Her CT scans will need to be staged based on her renal function.   Patient  Profile     72 y.o. female with a history of hypertension, type 2 diabetes mellitus, chronic bronchitis, rheumatoid arthritis, and severe low gradient aortic stenosis by recent work-up.  Also mild to moderate nonobstructive CAD.  Plan is further evaluation for TAVR.  Assessment & Plan    1.  Severe low gradient aortic stenosis   Cath this admission as noted above     2.  Severe hypothyroidism, TSH 116 recently. ,? Compllance   Pt unclear on meds Says her chiildren arent picking up at the pharmacy  That is why she hasn't taken      Levothyroxine has been resumed at presumed home dose    3.  Acute on chronic diastolic heart failure, LVEF 50 to 55%.   Volume is not  bad  4. 5.  Chronic bronchitis.  Prednisone added along with nebs yesterday   I would begin taper   20 tomorrow then 10 daily for 3 days after    Needs outpt f/u   COntinue breathing treatments    6.  Mild to moderate nonobstructive CAD by recent cardiac catheterization. Continue  aspirin and statin therapy.  7. Anemia, hemoglobin rel stable at 9.7  8.  CKD stage III, creatinine increased to 2 from 1.6   Follow   NOt on lasix  9  Dental:  Pt seen by Oretha Milch last week   Will require mutiple extractions , alveoloplasy and gross debridement of remainingn teeth under general ansthesia.    I do not see a date set.    10 DM  Glu is high  WIll resume oral agents   Pt is gettting prednisone which is not helping   She is on a heart healthy carb but family is bringing in cake, soda.    Closer to D/C home   Have PT/OT evaluate.  Signed, Dorris Carnes, MD  08/15/2018, 8:24 AM

## 2018-08-16 ENCOUNTER — Ambulatory Visit (HOSPITAL_COMMUNITY): Admission: RE | Admit: 2018-08-16 | Payer: Medicare HMO | Source: Ambulatory Visit | Admitting: Cardiovascular Disease

## 2018-08-16 ENCOUNTER — Encounter (HOSPITAL_COMMUNITY): Admission: RE | Payer: Self-pay | Source: Ambulatory Visit

## 2018-08-16 LAB — GLUCOSE, CAPILLARY
Glucose-Capillary: 230 mg/dL — ABNORMAL HIGH (ref 70–99)
Glucose-Capillary: 211 mg/dL — ABNORMAL HIGH (ref 70–99)
Glucose-Capillary: 309 mg/dL — ABNORMAL HIGH (ref 70–99)
Glucose-Capillary: 292 mg/dL — ABNORMAL HIGH (ref 70–99)

## 2018-08-16 SURGERY — RIGHT/LEFT HEART CATH AND CORONARY ANGIOGRAPHY
Anesthesia: LOCAL

## 2018-08-16 MED ORDER — ORAL CARE MOUTH RINSE
15.0000 mL | Freq: Two times a day (BID) | OROMUCOSAL | Status: DC
  Administered 2018-08-16: 15 mL via OROMUCOSAL

## 2018-08-16 MED ORDER — LIVING WELL WITH DIABETES BOOK
Freq: Once | Status: DC
  Filled 2018-08-16: qty 1

## 2018-08-16 MED ORDER — CEFAZOLIN SODIUM-DEXTROSE 2-4 GM/100ML-% IV SOLN
2.0000 g | Freq: Once | INTRAVENOUS | Status: AC
  Administered 2018-08-17: 2 g via INTRAVENOUS
  Filled 2018-08-16: qty 100

## 2018-08-16 NOTE — Progress Notes (Signed)
Progress Note  Patient Name: [name] [name] Date of Encounter: 08/16/2018  Primary Cardiologist:   Kate Sable, MD   Subjective   She says she is breathing OK.  No pain.   Inpatient Medications    Scheduled Meds: . amLODipine  5 mg Oral QHS  . aspirin EC  81 mg Oral Daily  . gabapentin  300 mg Oral BID  . glipiZIDE  10 mg Oral Q0600  . heparin  5,000 Units Subcutaneous Q8H  . insulin aspart  0-5 Units Subcutaneous QHS  . insulin aspart  0-9 Units Subcutaneous TID WC  . latanoprost  1 drop Both Eyes QHS  . levothyroxine  88 mcg Oral QAC breakfast  . metFORMIN  1,000 mg Oral BID WC  . [START ON 08/17/2018] predniSONE  10 mg Oral Q breakfast  . simvastatin  20 mg Oral QHS  . sodium chloride flush  3 mL Intravenous Q12H  . timolol  1 drop Both Eyes BID   Continuous Infusions: . sodium chloride     PRN Meds: sodium chloride, acetaminophen, albuterol, ALPRAZolam, bisacodyl, ondansetron (ZOFRAN) IV, senna-docusate, sodium chloride flush   Vital Signs    Vitals:   08/15/18 2303 08/16/18 0342 08/16/18 0543 08/16/18 0747  BP: (!) 166/86 (!) 150/74  (!) 154/82  Pulse: 77 72  72  Resp: 17 15  17   Temp: 97.8 F (36.6 C) 98.1 F (36.7 C)  (!) 97.5 F (36.4 C)  TempSrc: Oral Oral  Oral  SpO2: 94% 97%    Weight:   66.1 kg   Height:        Intake/Output Summary (Last 24 hours) at 08/16/2018 0931 Last data filed at 08/15/2018 2300 Gross per 24 hour  Intake 3 ml  Output 1150 ml  Net -1147 ml   Filed Weights   08/14/18 0500 08/15/18 0349 08/16/18 0543  Weight: 65.6 kg 65.2 kg 66.1 kg    Telemetry    NSR - Personally Reviewed  ECG    NA - Personally Reviewed  Physical Exam   GEN: No acute distress.   Neck: No  JVD Cardiac: RRR, 3/6 apical systolic murmur, rubs, or gallops.  Respiratory: Clear  to auscultation bilaterally.  Upper airway wheezing.  GI: Soft, nontender, non-distended  MS: No  edema; No deformity. Neuro:  Nonfocal  Psych: Normal affect    Labs    Chemistry Recent Labs  Lab 08/09/18 1200  08/13/18 0227 08/14/18 0216 08/15/18 0328  NA 139   < > 139 137 136  K 3.7   < > 4.9 5.2* 4.9  CL 105   < > 106 103 102  CO2 25   < > 27 26 26   GLUCOSE 181*   < > 236* 180* 313*  BUN 18   < > 26* 39* 34*  CREATININE 1.46*   < > 1.61* 2.44* 2.02*  CALCIUM 7.8*   < > 8.0* 8.4* 8.1*  PROT 5.6*  --   --   --   --   ALBUMIN 2.9*  --   --   --   --   AST 20  --   --   --   --   ALT 16  --   --   --   --   ALKPHOS 69  --   --   --   --   BILITOT 0.6  --   --   --   --   GFRNONAA 35*   < >  31* 19* 23*  GFRAA 40*   < > 36* 21* 27*  ANIONGAP 9   < > 6 8 8    < > = values in this interval not displayed.     Hematology Recent Labs  Lab 08/13/18 0227 08/14/18 0216 08/15/18 0328  WBC 8.3 8.2 8.6  RBC 3.28* 3.27* 3.28*  HGB 9.7* 9.9* 9.7*  HCT 30.2* 30.8* 30.0*  MCV 92.1 94.2 91.5  MCH 29.6 30.3 29.6  MCHC 32.1 32.1 32.3  RDW 13.8 14.1 13.3  PLT 191 201 207    Cardiac Enzymes Recent Labs  Lab 08/09/18 1200 08/09/18 1931 08/09/18 2339  TROPONINI 0.14* 0.11* 0.09*   No results for input(s): TROPIPOC in the last 168 hours.   BNP Recent Labs  Lab 08/09/18 1200  BNP 489.1*     DDimer No results for input(s): DDIMER in the last 168 hours.   Radiology    No results found.  Cardiac Studies   Echocardiogram 07/13/2018: Study Conclusions  - Left ventricle: The cavity size was normal. Wall thickness was increased increased in a pattern of mild to moderate LVH. Systolic function was normal. The estimated ejection fraction was in the range of 50% to 55%. Wall motion was normal; there were no regional wall motion abnormalities. Doppler parameters are consistent with abnormal left ventricular relaxation (grade 1 diastolic dysfunction). - Aortic valve: Mildly to moderately calcified annulus. Probably trileaflet; moderately calcified leaflets. There was mild regurgitation. Mean gradient (S): 20 mm  Hg. Peak gradient (S): 33 mm Hg. VTI ratio of LVOT to aortic valve: 0.19. Possible severe, low-gradient aortic stenosis. Valve area (VTI): 0.53 cm^2. Valve area (Vmax): 0.62 cm^2. Valve area (Vmean): 0.54 cm^2. - Mitral valve: Mildly calcified leaflets. There was mild regurgitation. - Left atrium: The atrium was at the upper limits of normal in size. - Right atrium: Central venous pressure (est): 3 mm Hg. - Atrial septum: No defect or patent foramen ovale was identified. - Tricuspid valve: There was mild regurgitation. - Pulmonary arteries: PA peak pressure: 15 mm Hg (S). - Pericardium, extracardiac: There was no pericardial effusion.  Cardiac catheterization 08/11/2018:  Prox RCA lesion is 30% stenosed.  Dist RCA lesion is 30% stenosed.  Ost RPDA to RPDA lesion is 70% stenosed.  Ost Cx to Prox Cx lesion is 20% stenosed.  Ost 2nd Mrg to 2nd Mrg lesion is 20% stenosed.  Prox LAD to Mid LAD lesion is 30% stenosed.  Ost 1st Diag to 1st Diag lesion is 40% stenosed.  Ost Ramus to Ramus lesion  Patient Profile     72 y.o. female a history of hypertension, type 2 diabetes mellitus, chronic bronchitis, rheumatoid arthritis, and severe low gradient aortic stenosis by recent work-up. Also mild to moderate nonobstructive CAD. Plan is further evaluation for TAVR.  Assessment & Plan    Severe AS (low gradient):  She has been evaluated for TAVR but will come back to the hospital for this at a future date.   Hypothyroid:    TSH is not controlled but compliance with meds has been questioned.  No change in therapy but will follow up as an out patient to see if TSH is normalizing.    Acute diastolic HF:  Seems to be euvolemic.  Continue current therapy.    DM:  Need a suggestion about whether she should go home on insulin.  I will ask the diabetes manager to weigh in and might need IM consult.    Chronic bronchitis:  No wheezing  today.  She is on steroids and we will continue  a taper.    CAD:  Non obstructive.    CKD III:   Creat is coming down.    Dental:  For multiple extractions tomorrow.  Discussed with Dr. Enrique Sack this AM.    Anemia:    Hbg is unchanged.    Conditioning:  I will consult PT.  Discontinue Foley.    For questions or updates, please contact [place] Please consult [email or web address] for contact info under Cardiology/STEMI.   Signed, Minus Breeding, MD  08/16/2018, 9:31 AM

## 2018-08-16 NOTE — Progress Notes (Signed)
Inpatient Diabetes Program Recommendations  AACE/ADA: New Consensus Statement on Inpatient Glycemic Control (2015)  Target Ranges:  Prepandial:   less than 140 mg/dL      Peak postprandial:   less than 180 mg/dL (1-2 hours)      Critically ill patients:  140 - 180 mg/dL   Lab Results  Component Value Date   GLUCAP 211 (H) 08/16/2018   HGBA1C 7.8 (H) 08/09/2018    Review of Glycemic Control  HgbA1C - 7.8%. May not be accurate with low H/H.  Home meds: metformin 1000 mg bid, Glipizide 10 mg QD Orders: Novolog 0-9 units tidwc and hs, glipizide 10 mg QD, metformin 1000 mg bid On Prednisone 20 mg QAM (tapering down from 40 mg)   Inpatient Diabetes Program Recommendations:     Add Levemir 8 units QHS D/C glipizide while inpatient to avoid hypoglycemia May benefit from addition of meal coverage insulin while on steroids - Novolog 3 units tidwc  Ordered Living Well with Diabetes book. Will discuss diabetes management on 9/4. Will need to f/u with PCP for her diabetes when discharged.  Thank you. Lorenda Peck, RD, LDN, CDE Inpatient Diabetes Coordinator [phone]

## 2018-08-17 ENCOUNTER — Inpatient Hospital Stay (HOSPITAL_COMMUNITY): Payer: Medicare HMO | Admitting: Certified Registered"

## 2018-08-17 ENCOUNTER — Encounter (HOSPITAL_COMMUNITY)
Admission: AD | Disposition: A | Discharge status: Home Health | Payer: Self-pay | Source: Other Acute Inpatient Hospital | Attending: Internal Medicine

## 2018-08-17 DIAGNOSIS — K029 Dental caries, unspecified: Secondary | ICD-10-CM

## 2018-08-17 DIAGNOSIS — K083 Retained dental root: Secondary | ICD-10-CM

## 2018-08-17 DIAGNOSIS — K036 Deposits [accretions] on teeth: Secondary | ICD-10-CM

## 2018-08-17 DIAGNOSIS — K0530 Chronic periodontitis, unspecified: Secondary | ICD-10-CM

## 2018-08-17 HISTORY — PX: MULTIPLE EXTRACTIONS WITH ALVEOLOPLASTY: SHX5342

## 2018-08-17 LAB — GLUCOSE, CAPILLARY
GLUCOSE-CAPILLARY: 132 mg/dL — AB (ref 70–99)
GLUCOSE-CAPILLARY: 191 mg/dL — AB (ref 70–99)
Glucose-Capillary: 144 mg/dL — ABNORMAL HIGH (ref 70–99)
Glucose-Capillary: 313 mg/dL — ABNORMAL HIGH (ref 70–99)
Glucose-Capillary: 228 mg/dL — ABNORMAL HIGH (ref 70–99)

## 2018-08-17 LAB — BASIC METABOLIC PANEL
Anion gap: 12 (ref 5–15)
BUN: 29 mg/dL — ABNORMAL HIGH (ref 8–23)
CHLORIDE: 101 mmol/L (ref 98–111)
CO2: 25 mmol/L (ref 22–32)
CREATININE: 1.44 mg/dL — AB (ref 0.44–1.00)
Calcium: 8.2 mg/dL — ABNORMAL LOW (ref 8.9–10.3)
GFR calc non Af Amer: 35 mL/min — ABNORMAL LOW (ref >60)
GFR, EST AFRICAN AMERICAN: 41 mL/min — AB (ref >60)
GLUCOSE: 238 mg/dL — AB (ref 70–99)
Potassium: 4.7 mmol/L (ref 3.5–5.1)
Sodium: 138 mmol/L (ref 135–145)

## 2018-08-17 SURGERY — MULTIPLE EXTRACTION WITH ALVEOLOPLASTY
Anesthesia: General | Site: Mouth

## 2018-08-17 MED ORDER — ONDANSETRON HCL 4 MG/2ML IJ SOLN
INTRAMUSCULAR | Status: AC
  Filled 2018-08-17: qty 2

## 2018-08-17 MED ORDER — LIDOCAINE-EPINEPHRINE 2 %-1:100000 IJ SOLN
INTRAMUSCULAR | Status: AC
  Filled 2018-08-17: qty 10.2

## 2018-08-17 MED ORDER — LACTATED RINGERS IV SOLN: INTRAVENOUS | Status: DC

## 2018-08-17 MED ORDER — CHLORHEXIDINE GLUCONATE 0.12 % MT SOLN
15.0000 mL | Freq: Two times a day (BID) | OROMUCOSAL | Status: DC
  Administered 2018-08-17 – 2018-08-18 (×3): 15 mL via OROMUCOSAL
  Filled 2018-08-17 (×3): qty 15

## 2018-08-17 MED ORDER — INFLUENZA VAC SPLIT HIGH-DOSE 0.5 ML IM SUSY
0.5000 mL | PREFILLED_SYRINGE | INTRAMUSCULAR | Status: AC
  Administered 2018-08-18: 0.5 mL via INTRAMUSCULAR
  Filled 2018-08-17: qty 0.5

## 2018-08-17 MED ORDER — MIDAZOLAM HCL 2 MG/2ML IJ SOLN
INTRAMUSCULAR | Status: DC | PRN
  Administered 2018-08-17: 1 mg via INTRAVENOUS

## 2018-08-17 MED ORDER — INSULIN DETEMIR 100 UNIT/ML ~~LOC~~ SOLN
8.0000 [IU] | Freq: Every day | SUBCUTANEOUS | Status: DC
  Administered 2018-08-17: 8 [IU] via SUBCUTANEOUS
  Filled 2018-08-17 (×2): qty 0.08

## 2018-08-17 MED ORDER — PROPOFOL 500 MG/50ML IV EMUL
INTRAVENOUS | Status: DC | PRN
  Administered 2018-08-17: 50 ug/kg/min via INTRAVENOUS
  Administered 2018-08-17: 40 ug/kg/min via INTRAVENOUS

## 2018-08-17 MED ORDER — HEMOSTATIC AGENTS (NO CHARGE) OPTIME
TOPICAL | Status: DC | PRN
  Administered 2018-08-17: 1 via TOPICAL

## 2018-08-17 MED ORDER — DEXAMETHASONE SODIUM PHOSPHATE 10 MG/ML IJ SOLN
INTRAMUSCULAR | Status: AC
  Filled 2018-08-17: qty 1

## 2018-08-17 MED ORDER — FENTANYL CITRATE (PF) 100 MCG/2ML IJ SOLN: 25.0000 ug | INTRAMUSCULAR | Status: DC | PRN

## 2018-08-17 MED ORDER — MIDAZOLAM HCL 2 MG/2ML IJ SOLN
INTRAMUSCULAR | Status: AC
  Filled 2018-08-17: qty 2

## 2018-08-17 MED ORDER — BUPIVACAINE-EPINEPHRINE (PF) 0.5% -1:200000 IJ SOLN
INTRAMUSCULAR | Status: AC
  Filled 2018-08-17: qty 3.6

## 2018-08-17 MED ORDER — FENTANYL CITRATE (PF) 100 MCG/2ML IJ SOLN
INTRAMUSCULAR | Status: DC | PRN
  Administered 2018-08-17 (×2): 25 ug via INTRAVENOUS

## 2018-08-17 MED ORDER — DEXAMETHASONE SODIUM PHOSPHATE 10 MG/ML IJ SOLN
INTRAMUSCULAR | Status: DC | PRN
  Administered 2018-08-17: 5 mg via INTRAVENOUS

## 2018-08-17 MED ORDER — LACTATED RINGERS IV SOLN
INTRAVENOUS | Status: DC | PRN
  Administered 2018-08-17: 08:00:00 via INTRAVENOUS

## 2018-08-17 MED ORDER — OXYMETAZOLINE HCL 0.05 % NA SOLN
NASAL | Status: AC
  Filled 2018-08-17: qty 15

## 2018-08-17 MED ORDER — FENTANYL CITRATE (PF) 250 MCG/5ML IJ SOLN
INTRAMUSCULAR | Status: AC
  Filled 2018-08-17: qty 5

## 2018-08-17 MED ORDER — ADULT MULTIVITAMIN W/MINERALS CH
1.0000 | ORAL_TABLET | Freq: Every day | ORAL | Status: DC
  Administered 2018-08-17 – 2018-08-18 (×2): 1 via ORAL
  Filled 2018-08-17 (×2): qty 1

## 2018-08-17 MED ORDER — BUPIVACAINE-EPINEPHRINE 0.5% -1:200000 IJ SOLN: INTRAMUSCULAR | Status: DC | PRN

## 2018-08-17 MED ORDER — PROPOFOL 10 MG/ML IV BOLUS
INTRAVENOUS | Status: AC
  Filled 2018-08-17: qty 20

## 2018-08-17 MED ORDER — 0.9 % SODIUM CHLORIDE (POUR BTL) OPTIME
TOPICAL | Status: DC | PRN
  Administered 2018-08-17: 1000 mL

## 2018-08-17 MED ORDER — LIDOCAINE-EPINEPHRINE 2 %-1:100000 IJ SOLN
INTRAMUSCULAR | Status: DC | PRN
  Administered 2018-08-17: 3.4 mL via INTRADERMAL

## 2018-08-17 MED ORDER — ONDANSETRON HCL 4 MG/2ML IJ SOLN
INTRAMUSCULAR | Status: DC | PRN
  Administered 2018-08-17: 4 mg via INTRAVENOUS

## 2018-08-17 MED ORDER — JUVEN PO PACK
1.0000 | PACK | Freq: Two times a day (BID) | ORAL | Status: DC
  Administered 2018-08-17 – 2018-08-18 (×2): 1 via ORAL
  Filled 2018-08-17 (×3): qty 1

## 2018-08-17 MED ORDER — HYDROCODONE-ACETAMINOPHEN 7.5-325 MG/15ML PO SOLN: 10.0000 mL | Freq: Four times a day (QID) | ORAL | Status: DC | PRN

## 2018-08-17 SURGICAL SUPPLY — 39 items
ALCOHOL 70% 16 OZ (MISCELLANEOUS) ×2 IMPLANT
ATTRACTOMAT 16X20 MAGNETIC DRP (DRAPES) ×2 IMPLANT
BANDAGE HEMOSTAT MRDH 4X4 STRL (MISCELLANEOUS) IMPLANT
BLADE SURG 15 STRL LF DISP TIS (BLADE) ×2 IMPLANT
BLADE SURG 15 STRL SS (BLADE) ×2
BNDG HEMOSTAT MRDH 4X4 STRL (MISCELLANEOUS)
COVER SURGICAL LIGHT HANDLE (MISCELLANEOUS) ×2 IMPLANT
GAUZE 4X4 16PLY RFD (DISPOSABLE) ×2 IMPLANT
GAUZE PACKING FOLDED 2  STR (GAUZE/BANDAGES/DRESSINGS) ×1
GAUZE PACKING FOLDED 2 STR (GAUZE/BANDAGES/DRESSINGS) ×1 IMPLANT
GLOVE BIO SURGEON STRL SZ 6.5 (GLOVE) ×2 IMPLANT
GLOVE SURG ORTHO 8.0 STRL STRW (GLOVE) ×2 IMPLANT
GOWN STRL REUS W/ TWL LRG LVL3 (GOWN DISPOSABLE) ×1 IMPLANT
GOWN STRL REUS W/TWL 2XL LVL3 (GOWN DISPOSABLE) ×2 IMPLANT
GOWN STRL REUS W/TWL LRG LVL3 (GOWN DISPOSABLE) ×1
HEMOSTAT SURGICEL 2X14 (HEMOSTASIS) IMPLANT
KIT BASIN OR (CUSTOM PROCEDURE TRAY) ×2 IMPLANT
KIT TURNOVER KIT B (KITS) ×2 IMPLANT
MANIFOLD NEPTUNE WASTE (CANNULA) ×2 IMPLANT
NEEDLE BLUNT 16X1.5 OR ONLY (NEEDLE) IMPLANT
NEEDLE DENTAL 27 LONG (NEEDLE) IMPLANT
NS IRRIG 1000ML POUR BTL (IV SOLUTION) ×2 IMPLANT
PACK EENT II TURBAN DRAPE (CUSTOM PROCEDURE TRAY) ×2 IMPLANT
PAD ARMBOARD 7.5X6 YLW CONV (MISCELLANEOUS) ×2 IMPLANT
SPONGE SURGIFOAM ABS GEL 100 (HEMOSTASIS) IMPLANT
SPONGE SURGIFOAM ABS GEL 12-7 (HEMOSTASIS) IMPLANT
SPONGE SURGIFOAM ABS GEL SZ50 (HEMOSTASIS) ×2 IMPLANT
SUCTION FRAZIER HANDLE 10FR (MISCELLANEOUS) ×1
SUCTION TUBE FRAZIER 10FR DISP (MISCELLANEOUS) ×1 IMPLANT
SUT CHROMIC 3 0 PS 2 (SUTURE) ×2 IMPLANT
SUT CHROMIC 4 0 P 3 18 (SUTURE) IMPLANT
SUT SILK 2 0 (SUTURE) ×1
SUT SILK 2-0 18XBRD TIE 12 (SUTURE) ×1 IMPLANT
SYR 50ML SLIP (SYRINGE) ×2 IMPLANT
TOWEL NATURAL 10PK STERILE (DISPOSABLE) ×2 IMPLANT
TUBE CONNECTING 12X1/4 (SUCTIONS) ×2 IMPLANT
WATER STERILE IRR 1000ML POUR (IV SOLUTION) ×2 IMPLANT
WATER TABLETS ICX (MISCELLANEOUS) ×2 IMPLANT
YANKAUER SUCT BULB TIP NO VENT (SUCTIONS) ×2 IMPLANT

## 2018-08-17 NOTE — Discharge Instructions (Signed)
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
## 2018-08-17 NOTE — Progress Notes (Signed)
Initial Nutrition Assessment  DOCUMENTATION CODES:   Obesity unspecified  INTERVENTION:   -1 packet Juven BID, each packet provides 80 calories, 8 grams of carbohydrate, and 14 grams of amino acids; supplement contains CaHMB, glutamine, and arginine, to promote wound healing -Refer to outpatient diabetes education via [facility]   NUTRITION DIAGNOSIS:   Increased nutrient needs related to post-op healing(dental extractions) as evidenced by estimated needs.   GOAL:   Patient will meet greater than or equal to 90% of their needs  MONITOR:   PO intake, Supplement acceptance, Diet advancement, Labs, Weight trends, Skin, I & O's  REASON FOR ASSESSMENT:   Malnutrition Screening Tool, Consult Assessment of nutrition requirement/status  ASSESSMENT:   72 yo female with history of hypertension, HLD, hypothyroidism, diabetes, chronic bronchitis, rheumatoid arthritis and severe aortic stenosis who was initially referred to the valve clinic by Dr. Bronson Ing for further discussion regarding her aortic stenosis and possible TAVR.   Pt admitted with severe aortic stenosis.   8/29- s/p rt/lt heart cath with coronary angiography 9/4- s/p surgical extraction of tooth numbers 7,8,9; 2 quadrants of alveoloplasty, and gross debridement of remaining dentition  Spoke with pt at bedside, who was drowsy at time of visit. Pt recently returned from unit from dental extractions. Pt daughters were also in room, but distracted at time of visit and unable to provide further hx. Of note, pt daughters have been bringing outside food such as cake and sodas to pt.   Pt reports good appetite PTA. She shares she consumes 3 meals per day(Breakfast: one Boost supplement; Lunch and dinner: meat and two vegetable- usually broccoli and cheese and soft green beans). Pt shares she avoids seeds related to diverticulitis. She reports good appetite here; pt consumed 50-100% of  meals prior to surgery today.    Pt denies any weight loss. Per wt hx, wt has been stable over the past several years.   Discussed with pt regarding slow diet advancement given dental extractions. Pt understanding of plan.   Per cardiology notes, pt will need better control of thyroid and DM prior to undergoing TAVR.  Medications reviewed and include prednisone.  Last Hgb A1c: 7.8 (08/09/18). PTA DM medications are 10 mg glipizide daily and 1000 mg metformin BID.   Labs reviewed: CBGS: 132-309 (inpatient orders for glycemic control are 1000 mg metformib BID, 10 mg glipizide daily, 0-5 units insulin aspart q HS, 0-9 units insulin aspart TID).   NUTRITION - FOCUSED PHYSICAL EXAM:    Most Recent Value  Orbital Region  No depletion  Upper Arm Region  No depletion  Thoracic and Lumbar Region  No depletion  Buccal Region  No depletion  Temple Region  No depletion  Clavicle Bone Region  No depletion  Clavicle and Acromion Bone Region  No depletion  Scapular Bone Region  No depletion  Dorsal Hand  No depletion  Patellar Region  No depletion  Anterior Thigh Region  No depletion  Posterior Calf Region  No depletion  Edema (RD Assessment)  Mild  Hair  Reviewed  Eyes  Reviewed  Mouth  Reviewed  Skin  Reviewed  Nails  Reviewed      Diet Order:   Diet Order            Diet clear liquid Room service appropriate? No; Fluid consistency: Thin  Diet effective now              EDUCATION NEEDS:   Education needs have  been addressed  Skin:  Skin Assessment: Reviewed RN Assessment  Last BM:  08/16/18  Height:   Ht Readings from Last 1 Encounters:  08/09/18 4\' 9"  (1.448 m)    Weight:   Wt Readings from Last 1 Encounters:  08/17/18 65.3 kg    Ideal Body Weight:  43.2 kg  BMI:  Body mass index is 31.15 kg/m.  Estimated Nutritional Needs:   Kcal:  1450-1650  Protein:  65-80 grams  Fluid:  1.4-1.6 L    Bayley Hurn A. Jimmye Norman, RD, LDN, CDE Pager: [phone] After hours  Pager: [phone]

## 2018-08-17 NOTE — Anesthesia Preprocedure Evaluation (Addendum)
Anesthesia Evaluation  Patient identified by MRN, date of birth, ID band Patient awake    Reviewed: Allergy & Precautions, NPO status   Airway Mallampati: II   Neck ROM: Limited    Dental  (+) Poor Dentition, Missing, Chipped, Dental Advisory Given   Pulmonary shortness of breath,    breath sounds clear to auscultation       Cardiovascular hypertension, +CHF  + Valvular Problems/Murmurs  Rhythm:Regular Rate:Normal     Neuro/Psych    GI/Hepatic Neg liver ROS, PUD,   Endo/Other  diabetesHypothyroidism   Renal/GU negative Renal ROS     Musculoskeletal  (+) Arthritis ,   Abdominal   Peds  Hematology  (+) anemia ,   Anesthesia Other Findings   Reproductive/Obstetrics                           Anesthesia Physical Anesthesia Plan  ASA: III  Anesthesia Plan: MAC   Post-op Pain Management:    Induction: Intravenous  PONV Risk Score and Plan: Ondansetron, Dexamethasone, Midazolam and Treatment may vary due to age or medical condition  Airway Management Planned: Simple Face Mask and Nasal Cannula  Additional Equipment:   Intra-op Plan:   Post-operative Plan:   Informed Consent: I have reviewed the patients History and Physical, chart, labs and discussed the procedure including the risks, benefits and alternatives for the proposed anesthesia with the patient or authorized representative who has indicated his/her understanding and acceptance.   Dental advisory given  Plan Discussed with: CRNA and Anesthesiologist  Anesthesia Plan Comments:        Anesthesia Quick Evaluation

## 2018-08-17 NOTE — Plan of Care (Signed)

## 2018-08-17 NOTE — Care Management Note (Signed)
Case Management Note  Patient Details  Name: [name] [name] MRN: [id number] Date of Birth: [birth year]  Subjective/Objective:      Pt admitted for TAVR workup              Action/Plan:  PTA Independent from home with daughter.  Pt has PCP.     Expected Discharge Date:  08/12/18               Expected Discharge Plan:  Home/Self Care  In-House Referral:  Clinical Social Work  Discharge planning Services  CM Consult  Post Acute Care Choice:    Choice offered to:     DME Arranged:    DME Agency:     HH Arranged:    HH Agency:     Status of Service:     If discussed at [employer], dates discussed:    Additional Comments: 08/17/2018 Per bedside nurse; there has not been a sighting of a bed bug.  CM will place referrals with [facility] agencies that accept pts insurance and provide pt list of options   08/10/18 Pt repordily has bed bugs - CSW consulted to provide extermination resources.   Maryclare Labrador, RN 08/17/2018, 4:11 PM

## 2018-08-17 NOTE — Transfer of Care (Signed)
Immediate Anesthesia Transfer of Care Note  Patient: [name] [name]  Procedure(s) Performed: Extraction of tooth #'s 7,8, and 9 with alveoloplasty and gross debridement of remaining teeth (N/A Mouth)  Patient Location: PACU  Anesthesia Type:MAC  Level of Consciousness: awake, alert  and oriented  Airway & Oxygen Therapy: Patient Spontanous Breathing  Post-op Assessment: Report given to RN  Post vital signs: Reviewed and stable  Last Vitals:  Vitals Value Taken Time  BP 128/91 08/17/2018  9:40 AM  Temp 36.5 C 08/17/2018  9:40 AM  Pulse 72 08/17/2018  9:43 AM  Resp 15 08/17/2018  9:43 AM  SpO2 93 % 08/17/2018  9:43 AM  Vitals shown include unvalidated device data.  Last Pain:  Vitals:   08/17/18 0940  TempSrc:   PainSc: (P) 0-No pain      Patients Stated Pain Goal: 1 (50/03/70 4888)  Complications: No apparent anesthesia complications

## 2018-08-17 NOTE — Anesthesia Postprocedure Evaluation (Signed)
Anesthesia Post Note  Patient: [name] [name]  Procedure(s) Performed: Extraction of tooth #'s 7,8, and 9 with alveoloplasty and gross debridement of remaining teeth (N/A Mouth)     Patient location during evaluation: PACU Anesthesia Type: MAC Level of consciousness: awake Pain management: pain level controlled Respiratory status: spontaneous breathing Cardiovascular status: stable Postop Assessment: no apparent nausea or vomiting Anesthetic complications: no    Last Vitals:  Vitals:   08/17/18 1130 08/17/18 1645  BP: (!) 169/73 (!) 160/71  Pulse: 70   Resp: 15 19  Temp: (!) 36 C 36.8 C  SpO2: 93% 92%    Last Pain:  Vitals:   08/17/18 1645  TempSrc: Oral  PainSc: 0-No pain                 Earmon Sherrow

## 2018-08-17 NOTE — Progress Notes (Signed)
PT Cancellation Note  Patient Details Name: ZAHIRAH CHESLOCK MRN: [id number] DOB: 09-May-1946   Cancelled Treatment:    Reason Eval/Treat Not Completed: Patient at procedure or test/unavailable. Pt in OR. PT to return as able, as appropriate to continue therapy after procedure.  Kittie Plater, PT, DPT Acute Rehabilitation Services Pager #: [phone] Office #: [phone]    Berline Lopes 08/17/2018, 7:40 AM

## 2018-08-17 NOTE — Progress Notes (Signed)
Transport arrived to take patient to OR via bed.  Report called to anesthesia.  Family present and updated at bedside.

## 2018-08-17 NOTE — Progress Notes (Signed)
PRE-OPERATIVE NOTE:  08/17/2018 [name] [name] [id number]  VITALS: BP 121/75 (BP Location: Left Arm)   Pulse 73   Temp 97.7 F (36.5 C) (Oral)   Resp 14   Ht 4\' 9"  (1.448 m)   Wt 65.3 kg   SpO2 100%   BMI 31.15 kg/m   Lab Results  Component Value Date   WBC 8.6 08/15/2018   HGB 9.7 (L) 08/15/2018   HCT 30.0 (L) 08/15/2018   MCV 91.5 08/15/2018   PLT 207 08/15/2018   BMET    Component Value Date/Time   NA 136 08/15/2018 0328   NA 139 08/04/2018 1658   K 4.9 08/15/2018 0328   CL 102 08/15/2018 0328   CO2 26 08/15/2018 0328   GLUCOSE 313 (H) 08/15/2018 0328   BUN 34 (H) 08/15/2018 0328   BUN 21 08/04/2018 1658   CREATININE 2.02 (H) 08/15/2018 0328   CALCIUM 8.1 (L) 08/15/2018 0328   GFRNONAA 23 (L) 08/15/2018 0328   GFRAA 27 (L) 08/15/2018 0328    No results found for: INR, PROTIME No results found for: PTT   [name] presents for extraction of indicated teeth with alveoloplasty and gross debridement of remaining dentition in the operating room with monitored anesthesia care as patient condition will allow.  Gross debridement procedure may not be completed at this time since anesthesia team recommended monitored anesthesia care only debridement procedure will put patient at risk for possible aspiration of fluids.   SUBJECTIVE: The patient denies any acute medical or dental changes and agrees to proceed with treatment as planned.  The patient understands that she  will be evaluated in the operating room for a more thorough examination and consideration for additional extraction procedures other than the three maxillary anterior teeth.  EXAM: No sign of acute dental changes.  ASSESSMENT: Patient is affected by retained root segments, dental caries, chronic periodontitis, and Accretions.  PLAN: Patient agrees to proceed with treatment as planned in the operating room as previously discussed and accepts the risks, benefits, and complications of the  proposed treatment. Patient is aware of the risk for bleeding, bruising, swelling, infection, pain, nerve damage, soft tissue damage, damage to adjacent teeth, sinus involvement, root tip fracture, mandible fracture, and the risks of complications associated with the anesthesia. Patient also is aware of the potential for other complications up to and including death due to her overall cardiovascular and respiratory compromise.   Lenn Cal, DDS

## 2018-08-17 NOTE — Progress Notes (Signed)
Physical Therapy Treatment Patient Details Name: [name] [name] MRN: [id number] DOB: Oct 09, 1946 Today's Date: 08/17/2018    History of Present Illness 72 yo female with history of hypertension, HLD, hypothyroidism, diabetes, chronic bronchitis, rheumatoid arthritis and severe aortic stenosis who was initially referred to the valve clinic by Dr. Bronson Ing for further discussion regarding her aortic stenosis and possible TAVR.     PT Comments    Pre-TAVR assessment performed this visit, see below for note. Pt ambulating with stand by assistance no overt LOB, DOE 3/4 with activity, SpO2 WNL on RA.   6 Minute Walk Test  Distance - 615 ft      Pre    Post  BP     160/100   199/81  HR    84    122  SaO2 (RA)   98    94   Modified Borg Dyspnea 0    8  RPE    6    13    5  Meter Walk Test  Trial   1) 10.67 sec   2) 9.47 sec  3) 10.01 sec  Average - 10.05 sec = .62 ft/sec   Clinical Frailty Scale - 5            Follow Up Recommendations  Home health PT;Supervision/Assistance - 24 hour     Equipment Recommendations  None recommended by PT    Recommendations for Other Services       Precautions / Restrictions Restrictions Weight Bearing Restrictions: No    Mobility  Bed Mobility Overal bed mobility: Needs Assistance Bed Mobility: Supine to Sit;Sit to Supine     Supine to sit: Supervision Sit to supine: Supervision      Transfers Overall transfer level: Needs assistance Equipment used: 4-wheeled walker Transfers: Sit to/from Stand Sit to Stand: Min guard;Min assist            Ambulation/Gait Ambulation/Gait assistance: Supervision Gait Distance (Feet): 615 Feet Assistive device: Rolling walker (2 wheeled);None Gait Pattern/deviations: Step-through pattern;Decreased stride length;Staggering left;Drifts right/left Gait velocity: slow   General Gait Details: ambulating for 6 minute walk test on RA, HR max 120, SpO2 WNL on RA DOE 3/4  by end had to sit down    Stairs             Wheelchair Mobility    Modified Rankin (Stroke Patients Only)       Balance Overall balance assessment: Needs assistance Sitting-balance support: Feet supported;Bilateral upper extremity supported Sitting balance-Leahy Scale: Fair       Standing balance-Leahy Scale: Poor                              Cognition Arousal/Alertness: Awake/alert Behavior During Therapy: Flat affect                                          Exercises      General Comments        Pertinent Vitals/Pain Pain Assessment: No/denies pain    Home Living                      Prior Function            PT Goals (current goals can now be found in the care plan section) Acute Rehab PT Goals PT Goal Formulation: With patient  Time For Goal Achievement: 08/29/18 Potential to Achieve Goals: Good Progress towards PT goals: Progressing toward goals    Frequency    Min 3X/week      PT Plan Current plan remains appropriate    Co-evaluation              AM-PAC PT "6 Clicks" Daily Activity  Outcome Measure  Difficulty turning over in bed (including adjusting bedclothes, sheets and blankets)?: A Little Difficulty moving from lying on back to sitting on the side of the bed? : A Little Difficulty sitting down on and standing up from a chair with arms (e.g., wheelchair, bedside commode, etc,.)?: A Little Help needed moving to and from a bed to chair (including a wheelchair)?: A Little Help needed walking in hospital room?: A Little Help needed climbing 3-5 steps with a railing? : A Little 6 Click Score: 18    End of Session Equipment Utilized During Treatment: Gait belt Activity Tolerance: Patient tolerated treatment well Patient left: in bed;with call bell/phone within reach;with family/visitor present Nurse Communication: Mobility status PT Visit Diagnosis: Unsteadiness on feet (R26.81)      Time: 1800-1830 PT Time Calculation (min) (ACUTE ONLY): 30 min  Charges:  $Gait Training: 23-37 mins                    Reinaldo Berber, PT, DPT Acute Rehab Services Pager: [phone]     Reinaldo Berber 08/17/2018, 6:38 PM

## 2018-08-17 NOTE — Progress Notes (Addendum)
  [facility]   MULTIDISCIPLINARY HEART VALVE TEAM  Ms. Sticker's case was discussed with the multidisciplinary valve team.  She was felt to have paradoxical low flow low gradient severe aortic stenosis.  Heart catheterization showed only moderate AS.  She was admitted for acute on chronic diastolic heart failure.  TSH was found to be severely abnormal at 116.  Blood sugars also noted to be uncontrolled. She was also noted to have poor dentition and underwent multiple dental extractions.   Plan is for her to follow-up with her primary cardiologist, Dr. Bronson Ing, and PCP.  She needs to get her thyroid and diabetes under control before being considered for TAVR.  Would recommend follow up with PCP to address above issues and then we will see her back in valve clinic once she is medically optimized.     ADDENDUM: given home bed bug infestation, it was felt CT scanning should be done prior to discharge. We will plan to do this tomorrow as well as carotid dopplers. I have ordered a BMET today to assess renal function.    Angelena Form PA-C  [facility]  Pager [phone]

## 2018-08-17 NOTE — Op Note (Signed)
OPERATIVE REPORT  Patient:            [name] [name] Date of Birth:  [birth year] MRN:                [id number]   DATE OF PROCEDURE:  08/17/2018  PREOPERATIVE DIAGNOSES: 1.  Severe aortic stenosis 2.  Pre-heart valve surgery dental protocol 3.  Multiple retained root segments 4.  Dental caries 5.  Chronic periodontitis 6.  Accretions  POSTOPERATIVE DIAGNOSES: 1.  Severe aortic stenosis 2.  Pre-heart valve surgery dental protocol 3.  Multiple retained root segments 4.  Dental caries 5.  Chronic periodontitis 6.  Accretions  OPERATIONS: 1. Multiple surgical extraction of tooth numbers 7, 8, and 9.   2. 2 Quadrants of alveoloplasty 3. Gross debridement of remaining dentition   SURGEON: Lenn Cal, DDS  ASSISTANT: Molli Posey (dental assistant)  ANESTHESIA: Monitored anesthesia care per the anesthesia team   MEDICATIONS: 1. Ancef 2 g IV prior to invasive dental procedures. 2. Local anesthesia with a total utilization of 2 carpules each containing 34 mg of lidocaine with 0.017 mg of epinephrine.  SPECIMENS: There are 3 teeth that were discarded.  DRAINS: None  CULTURES: None  COMPLICATIONS: None  ESTIMATED BLOOD LOSS: Less than 25 mLs.  INTRAVENOUS FLUIDS: 300 mLs of Lactated ringers solution.  INDICATIONS: The patient was recently diagnosed with severe aortic stenosis.  A medically necessary dental consultation was then requested to evaluate poor dentition.  The patient was examined and treatment planned for extraction of indicated teeth with alveoloplasty and gross debridement remaining dentition in the operating room.  This treatment plan was formulated to decrease the risks and complications associated with dental infection from affecting the patient's systemic health and the anticipated heart valve surgery.  OPERATIVE FINDINGS: Patient was examined in operating room number 10.  The teeth were identified for extraction.  Tooth #7, 8, 9 were  identified for extraction.  No additional teeth would need to be extracted at this time.  The patient was noted be affected by chronic periodontitis, accretions, dental caries, and multiple retained root segments.   DESCRIPTION OF PROCEDURE: Patient was brought to the main operating room number 10. Patient was then placed in the supine position on the operating table.  Monitored anesthesia care was then induced per the anesthesia team. The patient was then prepped and draped in the usual manner for dental medicine procedure. A timeout was performed. The patient was identified and procedures were verified.  The oral cavity was then thoroughly examined with the findings noted above. The patient was then ready for dental medicine procedure as follows:  Local anesthesia was then administered sequentially with a total utilization of 2 carpules each containing 34 mg of lidocaine with 0.017 mg of epinephrine.  The Maxillary left and right quadrants first approached. Anesthesia was then delivered utilizing infiltration with lidocaine with epinephrine. A #15 blade incision was then made from the mesial of #10 and extended to the mesial #6.  A  surgical flap was then carefully reflected. The pain retained roots were then subluxated with a series of straight elevators.  Buccal and interseptal bone was then removed utilizing a surgical handpiece and bur with copious amounts of sterile water around 7, 8, and 9.  The teeth were then re-subluxated with a series straight elevators.  Tooth numbers 7, 8, 9 were then removed with a 150 forceps without complications.  Alveoloplasty was then performed utilizing a ronguers and bone file to help achieve  primary closure. The surgical site was then irrigated with copious amounts of sterile saline. The tissues were approximated and trimmed appropriately.  A piece of Surgifoam was placed in each extraction sockets.  The surgical site was then closed from the mesial #10 and extended to  the mesial of #6 utilizing 3-0 chromic gut suture in a continuous interrupted suture technique x1.  3 additional interrupted sutures were then placed to further close the surgical site.  At this point in time, a series of 4 x 4 gauze were placed in the mouth to aid in the absorption of the sterile water from the Painter.  A sonic scaler was used to remove accretions.  A series of hand curettes were then used to further remove accretions.  The Sonic scaler was then again used to refine the removal of the accretions.  This completed the gross debridement procedure.  All 4 x 4 gauzes were removed at this time.  At this point time, the entire mouth was irrigated carefully with sterile saline to avoid aspiration. The patient was examined for complications, seeing none, the dental medicine procedure was deemed to be complete. A series of 4 x 4 gauze were placed in the mouth to aid hemostasis. The patient was then handed over to the anesthesia team for final disposition. After an appropriate amount of time, the patient was taken to the postanesthsia care unit in good condition. All counts were correct for the dental medicine procedure.  The subcutaneous heparin therapy may be restarted this evening at 2200.  In the meantime, the SCDs will remain in place.  Patient is cleared for heart valve surgery if no postoperative complications arise.   Lenn Cal, DDS.

## 2018-08-18 ENCOUNTER — Encounter (HOSPITAL_COMMUNITY): Payer: Self-pay | Admitting: Dentistry

## 2018-08-18 ENCOUNTER — Inpatient Hospital Stay (HOSPITAL_COMMUNITY): Payer: Medicare HMO

## 2018-08-18 DIAGNOSIS — J42 Unspecified chronic bronchitis: Secondary | ICD-10-CM

## 2018-08-18 DIAGNOSIS — N183 Chronic kidney disease, stage 3 unspecified: Secondary | ICD-10-CM

## 2018-08-18 DIAGNOSIS — K089 Disorder of teeth and supporting structures, unspecified: Secondary | ICD-10-CM

## 2018-08-18 DIAGNOSIS — I5032 Chronic diastolic (congestive) heart failure: Secondary | ICD-10-CM

## 2018-08-18 DIAGNOSIS — K08199 Complete loss of teeth due to other specified cause, unspecified class: Secondary | ICD-10-CM

## 2018-08-18 DIAGNOSIS — Z01810 Encounter for preprocedural cardiovascular examination: Secondary | ICD-10-CM

## 2018-08-18 DIAGNOSIS — I350 Nonrheumatic aortic (valve) stenosis: Secondary | ICD-10-CM

## 2018-08-18 DIAGNOSIS — J209 Acute bronchitis, unspecified: Secondary | ICD-10-CM

## 2018-08-18 HISTORY — DX: Chronic kidney disease, stage 3 unspecified: N18.30

## 2018-08-18 LAB — BASIC METABOLIC PANEL
ANION GAP: 10 (ref 5–15)
BUN: 34 mg/dL — ABNORMAL HIGH (ref 8–23)
CHLORIDE: 104 mmol/L (ref 98–111)
CO2: 25 mmol/L (ref 22–32)
Calcium: 8.4 mg/dL — ABNORMAL LOW (ref 8.9–10.3)
Creatinine, Ser: 1.18 mg/dL — ABNORMAL HIGH (ref 0.44–1.00)
GFR calc non Af Amer: 45 mL/min — ABNORMAL LOW (ref >60)
GFR, EST AFRICAN AMERICAN: 52 mL/min — AB (ref >60)
GLUCOSE: 139 mg/dL — AB (ref 70–99)
Potassium: 3.9 mmol/L (ref 3.5–5.1)
Sodium: 139 mmol/L (ref 135–145)

## 2018-08-18 LAB — GLUCOSE, CAPILLARY
GLUCOSE-CAPILLARY: 170 mg/dL — AB (ref 70–99)
Glucose-Capillary: 104 mg/dL — ABNORMAL HIGH (ref 70–99)

## 2018-08-18 MED ORDER — AMLODIPINE BESYLATE 10 MG PO TABS: 10.0000 mg | ORAL_TABLET | Freq: Every day | ORAL | 3 refills | Status: DC

## 2018-08-18 MED ORDER — LEVOTHYROXINE SODIUM 88 MCG PO TABS: 88.0000 ug | ORAL_TABLET | Freq: Every day | ORAL | 3 refills | Status: DC

## 2018-08-18 MED ORDER — PREDNISONE 10 MG PO TABS: 10.0000 mg | ORAL_TABLET | Freq: Every day | ORAL | 0 refills | Status: DC

## 2018-08-18 MED ORDER — IOPAMIDOL (ISOVUE-370) INJECTION 76%
100.0000 mL | Freq: Once | INTRAVENOUS | Status: AC | PRN
  Administered 2018-08-18: 90 mL via INTRAVENOUS

## 2018-08-18 MED ORDER — METFORMIN HCL 1000 MG PO TABS: 500.0000 mg | ORAL_TABLET | Freq: Two times a day (BID) | ORAL | 3 refills | Status: DC

## 2018-08-18 MED ORDER — AMLODIPINE BESYLATE 10 MG PO TABS: 10.0000 mg | ORAL_TABLET | Freq: Every day | ORAL | Status: DC

## 2018-08-18 MED ORDER — CHLORHEXIDINE GLUCONATE 0.12 % MT SOLN: 15.0000 mL | Freq: Two times a day (BID) | OROMUCOSAL | 0 refills | Status: DC

## 2018-08-18 NOTE — Progress Notes (Signed)
POST OPERATIVE NOTE:  08/18/2018 [name] [name] [id number]  VITALS: BP (!) 165/72 (BP Location: Right Arm)   Pulse 66   Temp 97.6 F (36.4 C) (Oral)   Resp 16   Ht 4\' 9"  (1.448 m)   Wt 64.6 kg Comment: Scale A  SpO2 98%   BMI 30.84 kg/m   LABS:  Lab Results  Component Value Date   WBC 8.6 08/15/2018   HGB 9.7 (L) 08/15/2018   HCT 30.0 (L) 08/15/2018   MCV 91.5 08/15/2018   PLT 207 08/15/2018   BMET    Component Value Date/Time   NA 139 08/18/2018 0317   NA 139 08/04/2018 1658   K 3.9 08/18/2018 0317   CL 104 08/18/2018 0317   CO2 25 08/18/2018 0317   GLUCOSE 139 (H) 08/18/2018 0317   BUN 34 (H) 08/18/2018 0317   BUN 21 08/04/2018 1658   CREATININE 1.18 (H) 08/18/2018 0317   CALCIUM 8.4 (L) 08/18/2018 0317   GFRNONAA 45 (L) 08/18/2018 0317   GFRAA 52 (L) 08/18/2018 0317    No results found for: INR, PROTIME No results found for: PTT   [name] [name] is status post multiple extractions with alveoloplasty and gross debridement remaining dentition in the operating room with monitored anesthesia care on 08/17/2018.  SUBJECTIVE: The patient  with minimal complaints from dental extraction sites.  Patient denies any active bleeding.  EXAM: There is no sign of dental infection, heme, or ooze.  Sutures are intact.  ASSESSMENT: Post operative course is consistent with dental procedures performed in the operating room.. Loss of teeth due to extraction  PLAN: 1.  Start salt water rinses every 2 hours while awake as needed to aid healing 2.  Brush teeth after meals and at bedtime 3.  Use chlorhexidine rinses twice daily as prescribed 4.  Patient is cleared for heart valve surgery at the discretion of Dr. Angelena Form.   5.  Suture removal in 7 to 10 days if she is discharged. 6.  Patient to follow-up with a dentist of her choice for continued comprehensive dental care.  Patient will require antibiotic premedication prior to invasive dental procedures after the  anticipated heart valve surgery.   Lenn Cal, DDS

## 2018-08-18 NOTE — Progress Notes (Signed)
Call received from PA regarding d/c medications for DM.  A1c=7.8%.  Patient can likely resume oral medications that she was taking prior to admit and f/u with PCP.  Blood sugars have been elevated during hospitalization likely due to steroids.    Patient seen earlier by DM Coordinator, Barnie Alderman, RN.   Thanks,  Adah Perl, RN, BC-ADM Inpatient Diabetes Coordinator Pager [phone] (8a-5p)

## 2018-08-18 NOTE — Discharge Summary (Signed)
Discharge Summary    Patient ID: [name] [name],  MRN: [id number], DOB/AGE: 1946/05/24 72 y.o.  Admit date: 08/09/2018 Discharge date: 08/18/2018  Primary Care Provider: [employer] Primary Cardiologist: Kate Sable, MD  Discharge Diagnoses    Principal Problem:   Severe aortic stenosis Active Problems:   Essential hypertension   Hyperlipidemia   Aortic stenosis   Diabetes (HCC)   Chronic diastolic CHF (congestive heart failure) (HCC)   CKD (chronic kidney disease) stage 3, GFR 30-59 ml/min (HCC)   Poor dentition   Chronic bronchitis (HCC)   Allergies No Known Allergies  Diagnostic Studies/Procedures    Right and left heart cath  08/11/18   Prox RCA lesion is 30% stenosed.  Dist RCA lesion is 30% stenosed.  Ost RPDA to RPDA lesion is 70% stenosed.  Ost Cx to Prox Cx lesion is 20% stenosed.  Ost 2nd Mrg to 2nd Mrg lesion is 20% stenosed.  Prox LAD to Mid LAD lesion is 30% stenosed.  Ost 1st Diag to 1st Diag lesion is 40% stenosed.  Ost Ramus to Ramus lesion is 40% stenosed.  1. Mild to moderate non-obstructive CAD 2. Aortic valve stenosis (peak to peak gradient 16 mmHg, mean 19.5 mmHg, AVA 1.09 cm2). Severe by echo criteria (AVA 0.53 cm2,DVI 0.19. Stroke volume/bsa 19.4). She is felt to have paradoxical low flow/low gradient AS.  _____________   History of Present Illness     Patient was last seen by Dr. Bronson Ing on 07/20/2018 in follow-up for aortic stenosis and hypertension. She had recently undergone an echocardiogram on 07/13/2018 which demonstrated normal LV systolic function and normal regional wall motion, LVEF 50 to 55%, mild to moderate LVH, grade 1 diastolic dysfunction and possibly severe, low gradient aortic stenosis (mean gradient 20 mmHg), valve area 0.53 cm by VTI, mild aortic regurgitation, mild mitral regurgitation, and mild tricuspid regurgitation with normal pulmonary pressures.  Despite her chronic lung disease, she had  complaints of progressive dyspnea and "passing out" episodes and was therefore referred to Dr. Angelena Form for possible TAVR work-up. She was then seen by Dr. Angelena Form on 08/04/2017 with plans to proceed with TAVR procedure, starting with a right and left heart catheterization scheduled for 08/16/2018.   Unfortunately on 08/08/2018 patient presented to [facility] with acutely worsening dyspnea which began while getting ready for bed on 08/08/18. Pt states that her symptoms had began getting worse several days ago and reports having to take her Albuterol approximately 6 times yesterday with only temporary relief. She states that she normally does not take her Albuterol on a daily basis. She denies chest pain, palpitations, dizziness, syncope, LE swelling, orthopnea, PND.  Patient has known balance issues and walks with a cane at baseline.  She has no known history of CAD.  She denies tobacco, alcohol or illicit drug use.  She has known poor dentition and was scheduled to see a dentist for pro-op consultation today, 08/09/18. She was transferred to [facility] on 08/09/18 for further workup given her acute symptoms and scheduled procedure next week.   Hospital Course     Consultants: Dental consult; Diabetes coordinator  1. Aortic stenosis: patient noted to have severe aortic stenosis on echo outpatient. Was seen by the Structural heart team and recommended to initiate TAVR work-up. She underwent a R/LHC which revealed mild to moderate non-obstructive CAD and aoric valve stenosis with peak gradient 16 mmHg, mean 19.5 mmHg, AVA 1.09 cm2. She was felt to have paradoxical low flow/low gradient AS. She  underwent carotid US and CT scans per TAVR work up prior to discharge.   - Continue outpatient follow once medically optimized and hypothyroidism/diabetes is better controlled.   2. HTN: BP elevated this admission. Amlodipine was uptitrated during admission. Enalapril held given AKI - Continue amlodipine  - Continue  to titrate medications outpatient as needed  3. DM type 2: Hgb A1C was 7.8 this admission; goal <7. Diabetes coordinator assisted with medication recommendations. Felt that blood sugars were poorly controlled 2/2 steroid use for chronic bronchitis. Decision was made to discharge home on previously prescribed metformin 500mg  BID and glipizide 10mg  daily.  - Continue metformin and glipizide - Continue close follow-up with PCP for further management of diabetes.   4. Non-obstructive CAD: noted to have mild-moderate non-obstructive CAD on [facility] this admission. Recommended for risk factor modification - Continue aspirin and statin  5. Chronic diastolic CHF: EF 66-44% on recent outpatient echo 06/2018 with G1DD. She maintained a euvolemic status throughout admission without need for lasix.   6. Poor dentition: Patient was seen by Dr. Enrique Sack inpatient and underwent multiple extractions with alveoloplasty and gross debridement of remaining dentition on 08/17/18. Patient tolerated the procedure well and was provided instructions on post surgical care. She was recommended to have her sutures removed in 7-10 days after discharge.  - Follow-up with Dr. Enrique Sack in 7-10 days for suture removal  7. Hypothyroidism: TSH 116 this admission after presumably being off her levothyroxine for some time. She was restarted on 78mcg daily  - Will need repeat TSH in 6 weeks to monitor for improvement - defer to PCP  8. CKD stage 3: hospital course complicated by acute on chronic CKD with Cr peak of 2.44 which improved with IVFs and was 1.18 on the day of discharge - Continue to monitor routinely outpatient  9. Chronic bronchitis: Patient complained of worsening cough/SOB. She was started on a prednisone taper and symptoms improved.  - Discharged home with 1 tab of prednisone to complete prednisone taper.   She was evaluated by physical therapy and recommended for home physical therapy and a rolling walker. Case  Management assisted with discharge needs.     _____________  Discharge Vitals Blood pressure (!) 165/72, pulse 66, temperature 97.6 F (36.4 C), temperature source Oral, resp. rate 16, height 4\' 9"  (1.448 m), weight 64.6 kg, SpO2 98 %.  Filed Weights   08/16/18 0543 08/17/18 0423 08/18/18 0500  Weight: 66.1 kg 65.3 kg 64.6 kg    Labs & Radiologic Studies    CBC No results for input(s): WBC, NEUTROABS, HGB, HCT, MCV, PLT in the last 72 hours. Basic Metabolic Panel Recent Labs    08/17/18 1247 08/18/18 0317  NA 138 139  K 4.7 3.9  CL 101 104  CO2 25 25  GLUCOSE 238* 139*  BUN 29* 34*  CREATININE 1.44* 1.18*  CALCIUM 8.2* 8.4*   Liver Function Tests No results for input(s): AST, ALT, ALKPHOS, BILITOT, PROT, ALBUMIN in the last 72 hours. No results for input(s): LIPASE, AMYLASE in the last 72 hours. Cardiac Enzymes No results for input(s): CKTOTAL, CKMB, CKMBINDEX, TROPONINI in the last 72 hours. BNP Invalid input(s): POCBNP D-Dimer No results for input(s): DDIMER in the last 72 hours. Hemoglobin A1C No results for input(s): HGBA1C in the last 72 hours. Fasting Lipid Panel No results for input(s): CHOL, HDL, LDLCALC, TRIG, CHOLHDL, LDLDIRECT in the last 72 hours. Thyroid Function Tests No results for input(s): TSH, T4TOTAL, T3FREE, THYROIDAB in the last 72 hours.  Invalid input(s): FREET3 _____________  Darrol Angel  Result Date: 08/10/2018 CLINICAL DATA:  72 year old female with a history of poor dentition EXAM: ORTHOPANTOGRAM/PANORAMIC COMPARISON:  None. FINDINGS: Multiple dental amalgams are present. Dental caries present within the right maxillary lateral incisor and presumably canine teeth. No fracture identified. IMPRESSION: Endodontal disease. Electronically Signed   By: Corrie Mckusick D.O.   On: 08/10/2018 13:01   Dg Chest 2 View  Result Date: 08/13/2018 CLINICAL DATA:  72 year old female with lethargy and wheezing EXAM: CHEST - 2 VIEW COMPARISON:  Prior  chest x-ray 08/09/2018 FINDINGS: Cardiomegaly with left heart enlargement. Atherosclerotic calcifications present in the transverse aorta. Mild vascular congestion but no overt pulmonary edema. No pleural effusion, pneumothorax or focal airspace consolidation. Pulmonary aeration is improved compared to the most recent prior chest x-ray from 08/09/2018. Chronic background bronchitic changes. No acute osseous abnormality. IMPRESSION: 1. No acute cardiopulmonary process. 2. Stable cardiomegaly with left heart enlargement. Electronically Signed   By: Jacqulynn Cadet M.D.   On: 08/13/2018 16:22   Ct Coronary Morph W/cta Cor W/score W/ca W/cm &/or Wo/cm  Result Date: 08/18/2018 EXAM: OVER-READ INTERPRETATION  CT CHEST The following report is an over-read performed by radiologist Dr. Vinnie Langton of [facility], [place] on 08/18/2018. This over-read does not include interpretation of cardiac or coronary anatomy or pathology. The coronary CTA interpretation by the cardiologist is attached. COMPARISON:  Chest CT 04/16/2018. FINDINGS: Extracardiac findings will be described separately under dictation for contemporaneously obtained CTA chest, abdomen and pelvis. IMPRESSION: 1. Please see separate dictation for contemporaneously obtained CTA chest, abdomen and pelvis dated 08/18/2018 for full description of relevant extracardiac findings. Electronically Signed   By: Vinnie Langton M.D.   On: 08/18/2018 14:27   Dg Chest Port 1 View  Result Date: 08/09/2018 CLINICAL DATA:  Shortness of breath. History of aortic stenosis, hypertension, diabetes, nonsmoker. EXAM: PORTABLE CHEST 1 VIEW COMPARISON:  PA and lateral chest x-ray of August 09, 2018 at 3:27 a.m. FINDINGS: The lungs are reasonably well inflated. The interstitial markings are less conspicuous but remain increased. The cardiac silhouette remains enlarged. The pulmonary vascularity remains engorged but is more distinct. There is calcification in the wall of  the aortic arch. The bony thorax exhibits no acute abnormality. IMPRESSION: CHF with decreased pulmonary interstitial edema. Stable cardiomegaly. Thoracic aortic atherosclerosis. Electronically Signed   By: David  Martinique M.D.   On: 08/09/2018 12:39   Ct Angio Chest Aorta W/cm &/or Wo/cm  Result Date: 08/18/2018 CLINICAL DATA:  72 year old female with history of severe aortic stenosis. Preprocedural study prior to potential transcatheter aortic valve replacement (TAVR) procedure. EXAM: CT ANGIOGRAPHY CHEST, ABDOMEN AND PELVIS TECHNIQUE: Multidetector CT imaging through the chest, abdomen and pelvis was performed using the standard protocol during bolus administration of intravenous contrast. Multiplanar reconstructed images and MIPs were obtained and reviewed to evaluate the vascular anatomy. CONTRAST:  12mL ISOVUE-370 IOPAMIDOL (ISOVUE-370) INJECTION 76% COMPARISON:  Chest CT 04/16/2018. FINDINGS: CTA CHEST FINDINGS Cardiovascular: Heart size is mildly enlarged. There is no significant pericardial fluid, thickening or pericardial calcification. There is aortic atherosclerosis, as well as atherosclerosis of the great vessels of the mediastinum and the coronary arteries, including calcified atherosclerotic plaque in the left anterior descending and right coronary arteries. Mediastinum/Lymph Nodes: No pathologically enlarged mediastinal or hilar lymph nodes. Esophagus is unremarkable in appearance. No axillary lymphadenopathy. Lungs/Pleura: Small right pleural effusion lying dependently. No acute consolidative airspace disease. No pleural effusions. No suspicious appearing pulmonary nodules or masses. Musculoskeletal/Soft Tissues: There are  no aggressive appearing lytic or blastic lesions noted in the visualized portions of the skeleton. CTA ABDOMEN AND PELVIS FINDINGS Hepatobiliary: No suspicious cystic or solid hepatic lesions. No intra or extrahepatic biliary ductal dilatation. Status post cholecystectomy.  Pancreas: No pancreatic mass. No pancreatic ductal dilatation. No pancreatic or peripancreatic fluid or inflammatory changes. Spleen: Unremarkable. Adrenals/Urinary Tract: Horseshoe kidney (normal anatomical variant) incidentally noted. No suspicious renal lesions. No hydroureteronephrosis. Urinary bladder is normal in appearance. Bilateral adrenal glands are normal in appearance. Stomach/Bowel: Normal appearance of the stomach. No pathologic dilatation of small bowel or colon. A few scattered colonic diverticulae are noted, without surrounding inflammatory changes to suggest an acute diverticulitis at this time. The appendix is not confidently identified and may be surgically absent. Regardless, there are no inflammatory changes noted adjacent to the cecum to suggest the presence of an acute appendicitis at this time. Vascular/Lymphatic: Aortic atherosclerosis with vascular findings and measurements pertinent to potential TAVR procedure, as detailed below. No aneurysm or dissection noted in the abdominal or pelvic vasculature. No lymphadenopathy noted in the abdomen or pelvis. Reproductive: Status post hysterectomy. Ovaries are unremarkable in appearance. Other: No significant volume of ascites.  No pneumoperitoneum. Musculoskeletal: Status post PLIF from L3-S1 with interbody cage at L3-L4 and interbody grafts at L4-L5 and L5-S1. There are no aggressive appearing lytic or blastic lesions noted in the visualized portions of the skeleton. VASCULAR MEASUREMENTS PERTINENT TO TAVR: AORTA: Minimal Aortic Diameter-11 x 12 mm Severity of Aortic Calcification-mild RIGHT PELVIS: Right Common Iliac Artery - Minimal Diameter-7.1 x 7.1 mm Tortuosity-mild Calcification - mild Right External Iliac Artery - Minimal Diameter-7.3 x 7.1 mm Tortuosity - mild Calcification-none Right Common Femoral Artery - Minimal Diameter-6.9 x 6.8 mm Tortuosity - mild Calcification-none LEFT PELVIS: Left Common Iliac Artery - Minimal Diameter-6.9 x  6.5 mm Tortuosity - mild Calcification - mild Left External Iliac Artery - Minimal Diameter-6.6 x 6.5 mm Tortuosity - mild Calcification-none Left Common Femoral Artery - Minimal Diameter-7.3 x 6.7 mm Tortuosity - mild Calcification-none Review of the MIP images confirms the above findings. IMPRESSION: 1. Vascular findings and measurements pertinent to potential TAVR procedure, as detailed above. 2. Severe thickening calcification of the aortic valve, compatible with the reported clinical history of severe aortic stenosis. 3. Small right pleural effusion lying dependently. 4. Mild cardiomegaly. 5. Aortic atherosclerosis, in addition to 2 vessel coronary artery disease. Assessment for potential risk factor modification, dietary therapy or pharmacologic therapy may be warranted, if clinically indicated. 6. Mild colonic diverticulosis without evidence of acute diverticulitis at this time. 7. Horseshoe kidney (normal anatomical variant) incidentally noted. 8. Additional incidental findings, as above. Electronically Signed   By: Vinnie Langton M.D.   On: 08/18/2018 14:43   Ct Angio Abd/pel W/ And/or W/o  Result Date: 08/18/2018 CLINICAL DATA:  72 year old female with history of severe aortic stenosis. Preprocedural study prior to potential transcatheter aortic valve replacement (TAVR) procedure. EXAM: CT ANGIOGRAPHY CHEST, ABDOMEN AND PELVIS TECHNIQUE: Multidetector CT imaging through the chest, abdomen and pelvis was performed using the standard protocol during bolus administration of intravenous contrast. Multiplanar reconstructed images and MIPs were obtained and reviewed to evaluate the vascular anatomy. CONTRAST:  4mL ISOVUE-370 IOPAMIDOL (ISOVUE-370) INJECTION 76% COMPARISON:  Chest CT 04/16/2018. FINDINGS: CTA CHEST FINDINGS Cardiovascular: Heart size is mildly enlarged. There is no significant pericardial fluid, thickening or pericardial calcification. There is aortic atherosclerosis, as well as  atherosclerosis of the great vessels of the mediastinum and the coronary arteries, including calcified atherosclerotic plaque in  the left anterior descending and right coronary arteries. Mediastinum/Lymph Nodes: No pathologically enlarged mediastinal or hilar lymph nodes. Esophagus is unremarkable in appearance. No axillary lymphadenopathy. Lungs/Pleura: Small right pleural effusion lying dependently. No acute consolidative airspace disease. No pleural effusions. No suspicious appearing pulmonary nodules or masses. Musculoskeletal/Soft Tissues: There are no aggressive appearing lytic or blastic lesions noted in the visualized portions of the skeleton. CTA ABDOMEN AND PELVIS FINDINGS Hepatobiliary: No suspicious cystic or solid hepatic lesions. No intra or extrahepatic biliary ductal dilatation. Status post cholecystectomy. Pancreas: No pancreatic mass. No pancreatic ductal dilatation. No pancreatic or peripancreatic fluid or inflammatory changes. Spleen: Unremarkable. Adrenals/Urinary Tract: Horseshoe kidney (normal anatomical variant) incidentally noted. No suspicious renal lesions. No hydroureteronephrosis. Urinary bladder is normal in appearance. Bilateral adrenal glands are normal in appearance. Stomach/Bowel: Normal appearance of the stomach. No pathologic dilatation of small bowel or colon. A few scattered colonic diverticulae are noted, without surrounding inflammatory changes to suggest an acute diverticulitis at this time. The appendix is not confidently identified and may be surgically absent. Regardless, there are no inflammatory changes noted adjacent to the cecum to suggest the presence of an acute appendicitis at this time. Vascular/Lymphatic: Aortic atherosclerosis with vascular findings and measurements pertinent to potential TAVR procedure, as detailed below. No aneurysm or dissection noted in the abdominal or pelvic vasculature. No lymphadenopathy noted in the abdomen or pelvis. Reproductive:  Status post hysterectomy. Ovaries are unremarkable in appearance. Other: No significant volume of ascites.  No pneumoperitoneum. Musculoskeletal: Status post PLIF from L3-S1 with interbody cage at L3-L4 and interbody grafts at L4-L5 and L5-S1. There are no aggressive appearing lytic or blastic lesions noted in the visualized portions of the skeleton. VASCULAR MEASUREMENTS PERTINENT TO TAVR: AORTA: Minimal Aortic Diameter-11 x 12 mm Severity of Aortic Calcification-mild RIGHT PELVIS: Right Common Iliac Artery - Minimal Diameter-7.1 x 7.1 mm Tortuosity-mild Calcification - mild Right External Iliac Artery - Minimal Diameter-7.3 x 7.1 mm Tortuosity - mild Calcification-none Right Common Femoral Artery - Minimal Diameter-6.9 x 6.8 mm Tortuosity - mild Calcification-none LEFT PELVIS: Left Common Iliac Artery - Minimal Diameter-6.9 x 6.5 mm Tortuosity - mild Calcification - mild Left External Iliac Artery - Minimal Diameter-6.6 x 6.5 mm Tortuosity - mild Calcification-none Left Common Femoral Artery - Minimal Diameter-7.3 x 6.7 mm Tortuosity - mild Calcification-none Review of the MIP images confirms the above findings. IMPRESSION: 1. Vascular findings and measurements pertinent to potential TAVR procedure, as detailed above. 2. Severe thickening calcification of the aortic valve, compatible with the reported clinical history of severe aortic stenosis. 3. Small right pleural effusion lying dependently. 4. Mild cardiomegaly. 5. Aortic atherosclerosis, in addition to 2 vessel coronary artery disease. Assessment for potential risk factor modification, dietary therapy or pharmacologic therapy may be warranted, if clinically indicated. 6. Mild colonic diverticulosis without evidence of acute diverticulitis at this time. 7. Horseshoe kidney (normal anatomical variant) incidentally noted. 8. Additional incidental findings, as above. Electronically Signed   By: Vinnie Langton M.D.   On: 08/18/2018 14:43   Disposition    Patient was seen and examined by Dr. Percival Spanish who deemed patient as stable for discharge. Follow-up has been arranged. Discharge medications as listed below.   Follow-up Plans & Appointments    Follow-up Information    Call Lenn Cal, DDS.   Specialty:  Dentistry Why:  Call for evaluation of healing and suture removal after patient is discharged Contact information: [address] [place] [address] [phone]  [facility] Follow up.   Why:  physical therapy Contact information: [place] [address] [phone]        [facility] Follow up.   Specialty:  Home Health Services Why:  rollator Contact information: [place] [address] [place], [place], PA-C Follow up on 08/23/2018.   Specialties:  Physician Assistant, Cardiology Why:  Please arrive 15 minutes early for your 3:30pm post-hospital cardiology follow-up appointment Contact information: [address] [place] [address] [phone]        [employer], MD Follow up.   Specialty:  Internal Medicine Why:  Please call to schedule an appointment to be seen within 1-2 weeks of discharge for close hospital follow-up of your diabetes and hypothyroidism.  Contact information: [address] [place] [address] [phone]          Discharge Instructions    Amb Referral to Nutrition and Diabetic E   Complete by:  As directed       Discharge Medications   Allergies as of 08/18/2018   No Known Allergies     Medication List    STOP taking these medications   enalapril 20 MG tablet Commonly known as:  VASOTEC     TAKE these medications   ALPRAZolam 0.5 MG tablet Commonly known as:  XANAX Take 0.5 mg by mouth at bedtime as needed for sleep.   amLODipine 10 MG tablet Commonly known as:  NORVASC Take 1 tablet (10 mg total) by mouth at bedtime. What  changed:  when to take this   aspirin EC 81 MG tablet Take 81 mg by mouth daily.   beta carotene w/minerals tablet Take 1 tablet by mouth daily.   chlorhexidine 0.12 % solution Commonly known as:  PERIDEX Use as directed 15 mLs in the mouth or throat 2 (two) times daily.   cimetidine 200 MG tablet Commonly known as:  TAGAMET Take 200 mg by mouth daily as needed (acid reflux/indigestion.).   Cinnamon 500 MG capsule Take 500 mg by mouth daily.   [employer] VIT D 5000 HIGH-POTENCY 5000 units capsule Generic drug:  Cholecalciferol Take 5,000 Units by mouth daily.   ferrous sulfate 325 (65 FE) MG tablet Take 325 mg by mouth daily with breakfast.   Fish Oil 1000 MG Caps Take 1,000 mg by mouth daily.   gabapentin 300 MG capsule Commonly known as:  NEURONTIN Take 600 mg by mouth 3 (three) times daily.   glipiZIDE 10 MG tablet Commonly known as:  GLUCOTROL Take 10 mg by mouth daily.   HYDROcodone-acetaminophen 10-325 MG tablet Commonly known as:  NORCO Take 1 tablet by mouth every 8 (eight) hours as needed for moderate pain.   latanoprost 0.005 % ophthalmic solution Commonly known as:  XALATAN Place 1 drop into both eyes at bedtime.   levothyroxine 88 MCG tablet Commonly known as:  SYNTHROID, LEVOTHROID Take 1 tablet (88 mcg total) by mouth daily before breakfast.   metFORMIN 1000 MG tablet Commonly known as:  GLUCOPHAGE Take 0.5 tablets (500 mg total) by mouth 2 (two) times daily with a meal. What changed:  how much to take   potassium chloride SA 20 MEQ tablet Commonly known as:  K-DUR,KLOR-CON Take 20 mEq by mouth daily.   predniSONE 10 MG tablet Commonly known as:  DELTASONE Take 1 tablet (10 mg total) by mouth daily with breakfast. Start taking on:  08/19/2018  prenatal multivitamin Tabs tablet Take 1 tablet by mouth daily.   SALONPAS PAIN RELIEF PATCH EX Apply 1 patch topically every 12 (twelve) hours as needed (pain.).   simvastatin 20 MG tablet Commonly  known as:  ZOCOR Take 20 mg by mouth at bedtime.   timolol 0.5 % ophthalmic solution Commonly known as:  TIMOPTIC Place 1 drop into both eyes 2 (two) times daily.   TYLENOL 8 HOUR ARTHRITIS PAIN 650 MG CR tablet Generic drug:  acetaminophen Take 1,300 mg by mouth every 8 (eight) hours as needed for pain.   VENTOLIN HFA 108 (90 Base) MCG/ACT inhaler Generic drug:  albuterol Inhale 2 puffs into the lungs every 4 (four) hours as needed for wheezing or shortness of breath.   vitamin C 500 MG tablet Commonly known as:  ASCORBIC ACID Take 500 mg by mouth daily.   vitamin E 400 UNIT capsule Take 400 Units by mouth daily.            Durable Medical Equipment  (From admission, onward)         Start     Ordered   08/18/18 1257  For home use only DME 4 wheeled rolling walker with seat  Once    Question:  Patient needs a walker to treat with the following condition  Answer:  Dependent on walker for ambulation   08/18/18 1257            Outstanding Labs/Studies   Will need repeat TSH studies in 6 weeks.   Duration of Discharge Encounter   Greater than 30 minutes including physician time.  Signed, Abigail Butts PA-C 08/18/2018, 3:13 PM

## 2018-08-18 NOTE — Care Management Note (Addendum)
Case Management Note  Patient Details  Name: [name] [name] MRN: [id number] Date of Birth: 10-Nov-1946  Subjective/Objective:      Pt admitted for TAVR workup              Action/Plan:  PTA Independent from home with daughter.  Pt has PCP.     Expected Discharge Date:  08/12/18               Expected Discharge Plan:  Home/Self Care  In-House Referral:  Clinical Social Work  Discharge planning Services  CM Consult  Post Acute Care Choice:    Choice offered to:  Patient  DME Arranged:  Gilford Rile rolling with seat DME Agency:  [place]:  PT [facility] Agency:  [place]  Status of Service:  In process, will continue to follow  If discussed at Long Length of Stay Meetings, dates discussed:    Additional Comments: 08/18/2018  [employer] accepted pt - pt in agreement with company.  Pt interested in rollator - chose [facility] for agency - agency contacted and agency accepted referral.  Both agencies informed that pt will discharge home today.  Plan is for pt to return at a later date for TAVR.  Daughter will provide 24 hour supervision at discharge  08/17/18 Per bedside nurse; there has not been a sighting of a bed bug.  CM will place referrals with [facility] agencies that accept pts insurance and provide pt list of options   08/10/18 Pt repordily has bed bugs - CSW consulted to provide extermination resources.   Maryclare Labrador, RN 08/18/2018, 1:23 PM

## 2018-08-18 NOTE — Progress Notes (Signed)
Inpatient Diabetes Program Recommendations  AACE/ADA: New Consensus Statement on Inpatient Glycemic Control (2019)  Target Ranges:  Prepandial:   less than 140 mg/dL      Peak postprandial:   less than 180 mg/dL (1-2 hours)      Critically ill patients:  140 - 180 mg/dL   Results for MACKENZY, GRUMBINE (MRN [id number]) as of 08/18/2018 15:04  Ref. Range 08/09/2018 12:00  Hemoglobin A1C Latest Ref Range: 4.8 - 5.6 % 7.8 (H)    Review of Glycemic Control  Diabetes history: DM2 Outpatient Diabetes medications: Metformin 500 mg BID, Glipizide10 mg daily Current orders for Inpatient glycemic control: Levemir 8 units QHS, Novolog 0-9 units TID, Novolog 0-5 units QHS, Metformin 1000 mg BID  Inpatient Diabetes Program Recommendations:  HgbA1C: A1C 7.8% on 08/09/18 indicating an average glucose of 177 mg/dl.  NOTE: Consult noted for Diabetes Coordinator. Chart reviewed. Spoke with patient about diabetes and home regimen for diabetes control. Patient reports that she is followed by PCP for diabetes management and currently she takes Metformin 500 mg BID and Glipizide 10 mg daily as an outpatient for diabetes control. Patient reports that her PCP decreased her Metformin from 1000 to 500 mg at her last office visit.  Patient reports that she is taking DM medications as prescribed. Patient states that she checks her glucose 1 time per day in the mornings and it is usually 100-125 mg/dl.  Inquired about prior A1C and patient reports that she does not recall her last A1C value but states that PCP said he was very pleased. Discussed A1C results (7.8% on 08/09/18) and explained that her current A1C indicates an average glucose of 177 mg/dl over the past 2-3 months. Discussed glucose and A1C goals.   Discussed impact of nutrition, exercise, stress, sickness, and medications on diabetes control.Explained that patient has received steroids while inpatient and glucose has been elevated as a result. Patient states she  tries to follow a carb modified diet and limits her carbohydrate intake.  Encouraged patient to check her glucose 2 times per day and continue to follow up with PCP. Patient verbalized understanding of information discussed and she states that she has no further questions at this time related to diabetes.  Thanks, Barnie Alderman, RN, MSN, CDE Diabetes Coordinator Inpatient Diabetes Program [phone] (Team Pager)

## 2018-08-18 NOTE — Progress Notes (Signed)
VASCULAR LAB PRELIMINARY  PRELIMINARY  PRELIMINARY  PRELIMINARY  Carotid duplex completed.    Preliminary report:  1-39% ICA plaquing. Vertebral artery flow is antegrade.   Darienne Belleau, RVT 08/18/2018, 10:23 AM

## 2018-08-18 NOTE — Progress Notes (Signed)
Progress Note  Patient Name: [name] [name] Date of Encounter: 08/18/2018  Primary Cardiologist:   Kate Sable, MD   Subjective   She feels well and wants to go home.  No pain.    Inpatient Medications    Scheduled Meds: . amLODipine  5 mg Oral QHS  . aspirin EC  81 mg Oral Daily  . chlorhexidine  15 mL Mouth/Throat BID  . gabapentin  300 mg Oral BID  . insulin aspart  0-5 Units Subcutaneous QHS  . insulin aspart  0-9 Units Subcutaneous TID WC  . insulin detemir  8 Units Subcutaneous QHS  . latanoprost  1 drop Both Eyes QHS  . levothyroxine  88 mcg Oral QAC breakfast  . living well with diabetes book   Does not apply Once  . metFORMIN  1,000 mg Oral BID WC  . multivitamin with minerals  1 tablet Oral Daily  . nutrition supplement (JUVEN)  1 packet Oral BID BM  . predniSONE  10 mg Oral Q breakfast  . simvastatin  20 mg Oral QHS  . sodium chloride flush  3 mL Intravenous Q12H  . timolol  1 drop Both Eyes BID   Continuous Infusions: . sodium chloride Stopped (08/17/18 0655)  . lactated ringers 10 mL/hr at 08/17/18 1155   PRN Meds: sodium chloride, acetaminophen, albuterol, ALPRAZolam, bisacodyl, HYDROcodone-acetaminophen, ondansetron (ZOFRAN) IV, senna-docusate, sodium chloride flush   Vital Signs    Vitals:   08/18/18 0315 08/18/18 0500 08/18/18 0802 08/18/18 1209  BP: (!) 124/56  (!) 153/82 (!) 165/72  Pulse: 65  65 66  Resp: 16  14 16   Temp: 98.5 F (36.9 C)  98 F (36.7 C) 97.6 F (36.4 C)  TempSrc: Oral  Oral Oral  SpO2: 93%  96% 98%  Weight:  64.6 kg    Height:        Intake/Output Summary (Last 24 hours) at 08/18/2018 1247 Last data filed at 08/18/2018 0107 Gross per 24 hour  Intake 58.33 ml  Output 800 ml  Net -741.67 ml   Filed Weights   08/16/18 0543 08/17/18 0423 08/18/18 0500  Weight: 66.1 kg 65.3 kg 64.6 kg    Telemetry    NSR - Personally Reviewed  ECG    NA - Personally Reviewed  Physical Exam   GEN: No  acute distress.    Neck: No  JVD Cardiac: RRR, systolic murmur, rubs, or gallops.  Respiratory: Clear   to auscultation bilaterally. GI: Soft, nontender, non-distended, normal bowel sounds  MS:  No edema; No deformity. Neuro:   Nonfocal  Psych: Oriented and appropriate   Labs    Chemistry Recent Labs  Lab 08/15/18 0328 08/17/18 1247 08/18/18 0317  NA 136 138 139  K 4.9 4.7 3.9  CL 102 101 104  CO2 26 25 25   GLUCOSE 313* 238* 139*  BUN 34* 29* 34*  CREATININE 2.02* 1.44* 1.18*  CALCIUM 8.1* 8.2* 8.4*  GFRNONAA 23* 35* 45*  GFRAA 27* 41* 52*  ANIONGAP 8 12 10      Hematology Recent Labs  Lab 08/13/18 0227 08/14/18 0216 08/15/18 0328  WBC 8.3 8.2 8.6  RBC 3.28* 3.27* 3.28*  HGB 9.7* 9.9* 9.7*  HCT 30.2* 30.8* 30.0*  MCV 92.1 94.2 91.5  MCH 29.6 30.3 29.6  MCHC 32.1 32.1 32.3  RDW 13.8 14.1 13.3  PLT 191 201 207    Cardiac EnzymesNo results for input(s): TROPONINI in the last 168 hours. No results for input(s): TROPIPOC in  the last 168 hours.   BNPNo results for input(s): BNP, PROBNP in the last 168 hours.   DDimer No results for input(s): DDIMER in the last 168 hours.   Radiology    No results found.  Cardiac Studies   Cath  08/11/18   Prox RCA lesion is 30% stenosed.  Dist RCA lesion is 30% stenosed.  Ost RPDA to RPDA lesion is 70% stenosed.  Ost Cx to Prox Cx lesion is 20% stenosed.  Ost 2nd Mrg to 2nd Mrg lesion is 20% stenosed.  Prox LAD to Mid LAD lesion is 30% stenosed.  Ost 1st Diag to 1st Diag lesion is 40% stenosed.  Ost Ramus to Ramus lesion is 40% stenosed.   1. Mild to moderate non-obstructive CAD 2. Aortic valve stenosis (peak to peak gradient 16 mmHg, mean 19.5 mmHg, AVA 1.09 cm2). Severe by echo criteria (AVA 0.53 cm2,DVI 0.19. Stroke volume/bsa 19.4). She is felt to have paradoxical low flow/low gradient AS.   ECHO 07/13/18  Study Conclusions  - Left ventricle: The cavity size was normal. Wall thickness was   increased increased in a  pattern of mild to moderate LVH.   Systolic function was normal. The estimated ejection fraction was   in the range of 50% to 55%. Wall motion was normal; there were no   regional wall motion abnormalities. Doppler parameters are   consistent with abnormal left ventricular relaxation (grade 1   diastolic dysfunction). - Aortic valve: Mildly to moderately calcified annulus. Probably   trileaflet; moderately calcified leaflets. There was mild   regurgitation. Mean gradient (S): 20 mm Hg. Peak gradient (S): 33   mm Hg. VTI ratio of LVOT to aortic valve: 0.19. Possible severe,   low-gradient aortic stenosis. Valve area (VTI): 0.53 cm^2. Valve   area (Vmax): 0.62 cm^2. Valve area (Vmean): 0.54 cm^2. - Mitral valve: Mildly calcified leaflets. There was mild   regurgitation. - Left atrium: The atrium was at the upper limits of normal in   size. - Right atrium: Central venous pressure (est): 3 mm Hg. - Atrial septum: No defect or patent foramen ovale was identified. - Tricuspid valve: There was mild regurgitation. - Pulmonary arteries: PA peak pressure: 15 mm Hg (S). - Pericardium, extracardiac: There was no pericardial effusion.  Patient Profile     72 y.o. female with a history of hypertension, type 2 diabetes mellitus, chronic bronchitis, rheumatoid arthritis, and severe low gradient aortic stenosis by recent work-up. Also mild to moderate nonobstructive CAD. Plan is further evaluation for TAVR.  Assessment & Plan    AS:  Plan as outlined by structural heart team.  CT pending.   HTN: BP not controlled.  I will increase the Norvasc.    HYPOTHYROID:  Meds restarted this admission.   Follow up with her PCP  DM:  I appreciate the help of the Diabetes Coordinater.  Added Levemir 8 units QHS yesterday.  D/Ced glipizide while inpatient to avoid hypoglycemia.    She will follow with her PCP   DENTAL:  Status post extraction  CKD III:   Creat is improved  CONDITIONING:  PT consulted.   We  will discuss with nursing and make sure that she is ready for discharge from this standpoint.     For questions or updates, please contact [place] Please consult [email or web address] for contact info under Cardiology/STEMI.   Signed, Minus Breeding, MD  08/18/2018, 12:47 PM

## 2018-08-19 MED FILL — Nitroglycerin IV Soln 200 MCG/ML in D5W: INTRAVENOUS | Qty: 250 | Status: AC

## 2018-08-23 ENCOUNTER — Ambulatory Visit: Payer: Self-pay | Admitting: Student

## 2018-08-23 DIAGNOSIS — M069 Rheumatoid arthritis, unspecified: Secondary | ICD-10-CM | POA: Diagnosis not present

## 2018-08-23 DIAGNOSIS — I350 Nonrheumatic aortic (valve) stenosis: Secondary | ICD-10-CM | POA: Diagnosis not present

## 2018-08-23 DIAGNOSIS — N183 Chronic kidney disease, stage 3 (moderate): Secondary | ICD-10-CM | POA: Diagnosis not present

## 2018-08-23 DIAGNOSIS — J42 Unspecified chronic bronchitis: Secondary | ICD-10-CM | POA: Diagnosis not present

## 2018-08-23 DIAGNOSIS — E1165 Type 2 diabetes mellitus with hyperglycemia: Secondary | ICD-10-CM | POA: Diagnosis not present

## 2018-08-23 DIAGNOSIS — E1122 Type 2 diabetes mellitus with diabetic chronic kidney disease: Secondary | ICD-10-CM | POA: Diagnosis not present

## 2018-08-23 DIAGNOSIS — I5032 Chronic diastolic (congestive) heart failure: Secondary | ICD-10-CM | POA: Diagnosis not present

## 2018-08-23 DIAGNOSIS — I2510 Atherosclerotic heart disease of native coronary artery without angina pectoris: Secondary | ICD-10-CM | POA: Diagnosis not present

## 2018-08-23 DIAGNOSIS — E039 Hypothyroidism, unspecified: Secondary | ICD-10-CM | POA: Diagnosis not present

## 2018-08-23 DIAGNOSIS — I130 Hypertensive heart and chronic kidney disease with heart failure and stage 1 through stage 4 chronic kidney disease, or unspecified chronic kidney disease: Secondary | ICD-10-CM | POA: Diagnosis not present

## 2018-08-23 NOTE — Progress Notes (Deleted)
Cardiology Office Note    Date:  08/23/2018   ID:  [name] [name], DOB 06/04/1946, MRN [id number]  PCP:  Redmond School, MD  Cardiologist: Kate Sable, MD    No chief complaint on file.   History of Present Illness:    [name] [name] is a 72 y.o. female with past medical history of severe aortic stenosis, HTN, HLD, Type 2 DM, Hypothyroidism and Stage 3 CKD who presents to the office today for hospital follow-up.   She was last examined in clinic by Dr. Bronson Ing in 07/2018 and reported having exertional dyspnea.  Her recent echocardiogram had shown moderate to severe aortic stenosis, therefore she was referred to the structural heart team.  She was evaluated by Dr. Angelena Form on 08/04/2018 and plans were made for an outpatient cardiac catheterization later that month. In the interim, she presented to [facility] on 08/08/2018 for evaluation of worsening dyspnea on exertion and was transferred to [facility] for further evaluation. BNP was elevated to 489 on admission and she was started on IV Lasix.  She did undergo a right and left heart catheterization on 08/11/2018 which showed mild to moderate nonobstructive CAD and aortic valve stenosis with peak to peak gradient 16 mmHg, mean 19.5 mmHg, and AVA 1.09 cm2, thought to be most consistent with low flow/low gradient AS.  The evening following her procedure, she was noted to have a 28 beat run of NSVT and K+ replacement was provided.  Her respiratory status did worsen during admission which was thought to be due to bronchitis and she was started on a steroid taper with scheduled breathing treatments.  The structural heart team followed the patient during admission and she did undergo multiple tooth extractions by Dr. Enrique Sack during admission along with preoperative CT Imaging. She is still being considered for TAVR once her hypothyroidism (TSH elevated to 116 during admission) and Type 2 DM improve.      Past Medical History:    Diagnosis Date  . Anemia   . Anxiety   . Aortic stenosis   . Arthritis   . Blood transfusion without reported diagnosis   . Bronchitis   . CHF (congestive heart failure) ([place])   . CKD (chronic kidney disease) stage 3, GFR 30-59 ml/min (HCC) 08/18/2018  . Depression   . Diabetes mellitus   . Diverticulitis   . Dyspnea   . Glaucoma   . Heart murmur   . Hyperlipidemia   . Hypertension   . Hypothyroidism   . Macular degeneration   . Melanoma ([place])    left thigh, 2010  . Osteoarthritis   . Peptic ulcer   . Pre-diabetes   . Rheumatoid arthritis(714.0)   . Thyroid disease     Past Surgical History:  Procedure Laterality Date  . ABDOMINAL HYSTERECTOMY  1988  . APPENDECTOMY    . BACK SURGERY  07/2008   fusion  . BACK SURGERY  05/7208   complicated by post-op anemia requiring transfusion  . BREAST SURGERY     right breast biopsy  . CATARACT EXTRACTION    . CHOLECYSTECTOMY  1974  . COLONOSCOPY  06/06/2007   Proximal diminutive rectal polyps cold biopsied/removed, otherwise normal rectum/ Left-sided diverticula, the remainder of the colonic mucosa appeared normal. Path-tubular adnemoa.   . COLONOSCOPY  09/14/2012   OBS:JGGEZM polyps-removed as described  above Colonic diverticulosis. Poor prep. next TCS five years.   . COLONOSCOPY N/A 10/29/2016   Procedure: COLONOSCOPY;  Surgeon: Daneil Dolin, MD;  [facility];  Service: Endoscopy;  Laterality: N/A;  2:00 pm  . ESOPHAGOGASTRODUODENOSCOPY  09/2012   Rourk: gastric ulcer, gastric duodenal erosions, small hh. benign bx  . ESOPHAGOGASTRODUODENOSCOPY N/A 10/29/2016   Procedure: ESOPHAGOGASTRODUODENOSCOPY (EGD);  Surgeon: Daneil Dolin, MD;  [facility];  Service: Endoscopy;  Laterality: N/A;  . EYE SURGERY    . FOOT SURGERY  1994   left   . LUMBAR SPINE SURGERY  08/04/2014   dr Joya Salm  . macular degen    . MALONEY DILATION N/A 10/29/2016   Procedure: Venia Minks DILATION;  Surgeon: Daneil Dolin, MD;   [facility];  Service: Endoscopy;  Laterality: N/A;  . MELANOMA EXCISION  2010  . MULTIPLE EXTRACTIONS WITH ALVEOLOPLASTY N/A 08/17/2018   Procedure: Extraction of tooth #'s 7,8, and 9 with alveoloplasty and gross debridement of remaining teeth;  Surgeon: Lenn Cal, DDS;  Location: [facility];  Service: Oral Surgery;  Laterality: N/A;  . RIGHT/LEFT HEART CATH AND CORONARY ANGIOGRAPHY N/A 08/11/2018   Procedure: RIGHT/LEFT HEART CATH AND CORONARY ANGIOGRAPHY;  Surgeon: Burnell Blanks, MD;  Location: [facility];  Service: Cardiovascular;  Laterality: N/A;  . YAG LASER APPLICATION     OS    Current Medications: Outpatient Medications Prior to Visit  Medication Sig Dispense Refill  . acetaminophen (TYLENOL 8 HOUR ARTHRITIS PAIN) 650 MG CR tablet Take 1,300 mg by mouth every 8 (eight) hours as needed for pain.    Marland Kitchen ALPRAZolam (XANAX) 0.5 MG tablet Take 0.5 mg by mouth at bedtime as needed for sleep.     Marland Kitchen amLODipine (NORVASC) 10 MG tablet Take 1 tablet (10 mg total) by mouth at bedtime. 90 tablet 3  . aspirin EC 81 MG tablet Take 81 mg by mouth daily.     . beta carotene w/minerals (OCUVITE) tablet Take 1 tablet by mouth daily.    . chlorhexidine (PERIDEX) 0.12 % solution Use as directed 15 mLs in the mouth or throat 2 (two) times daily. 120 mL 0  . Cholecalciferol ([employer]S VIT D 5000 HIGH-POTENCY) 5000 UNITS capsule Take 5,000 Units by mouth daily.    . cimetidine (TAGAMET) 200 MG tablet Take 200 mg by mouth daily as needed (acid reflux/indigestion.).     Marland Kitchen Cinnamon 500 MG capsule Take 500 mg by mouth daily.    . ferrous sulfate 325 (65 FE) MG tablet Take 325 mg by mouth daily with breakfast.    . gabapentin (NEURONTIN) 300 MG capsule Take 600 mg by mouth 3 (three) times daily.     Marland Kitchen glipiZIDE (GLUCOTROL) 10 MG tablet Take 10 mg by mouth daily.     Marland Kitchen HYDROcodone-acetaminophen (NORCO) 10-325 MG tablet Take 1 tablet by mouth every 8 (eight) hours as needed for moderate  pain.     Marland Kitchen latanoprost (XALATAN) 0.005 % ophthalmic solution Place 1 drop into both eyes at bedtime.     Marland Kitchen levothyroxine (SYNTHROID, LEVOTHROID) 88 MCG tablet Take 1 tablet (88 mcg total) by mouth daily before breakfast. 30 tablet 3  . Liniments (SALONPAS PAIN RELIEF PATCH EX) Apply 1 patch topically every 12 (twelve) hours as needed (pain.).    Marland Kitchen metFORMIN (GLUCOPHAGE) 1000 MG tablet Take 0.5 tablets (500 mg total) by mouth 2 (two) times daily with a meal. 45 tablet 3  . Omega-3 Fatty Acids (FISH OIL) 1000 MG CAPS Take 1,000 mg by mouth daily.     . potassium chloride SA (K-DUR,KLOR-CON) 20 MEQ tablet  Take 20 mEq by mouth daily.     . predniSONE (DELTASONE) 10 MG tablet Take 1 tablet (10 mg total) by mouth daily with breakfast. 1 tablet 0  . Prenatal Vit-Fe Fumarate-FA (PRENATAL MULTIVITAMIN) TABS Take 1 tablet by mouth daily.    . simvastatin (ZOCOR) 20 MG tablet Take 20 mg by mouth at bedtime.      . timolol (TIMOPTIC) 0.5 % ophthalmic solution Place 1 drop into both eyes 2 (two) times daily.     . VENTOLIN HFA 108 (90 Base) MCG/ACT inhaler Inhale 2 puffs into the lungs every 4 (four) hours as needed for wheezing or shortness of breath.   0  . vitamin C (ASCORBIC ACID) 500 MG tablet Take 500 mg by mouth daily.    . vitamin E 400 UNIT capsule Take 400 Units by mouth daily.     No facility-administered medications prior to visit.      Allergies:   Patient has no known allergies.   Social History   Socioeconomic History  . Marital status: Widowed    Spouse name: Not on file  . Number of children: 2  . Years of education: Not on file  . Highest education level: Not on file  Occupational History  . Not on file  Social Needs  . Financial resource strain: Somewhat hard  . Food insecurity:    Worry: Sometimes true    Inability: Sometimes true  . Transportation needs:    Medical: No    Non-medical: Yes  Tobacco Use  . Smoking status: Never Smoker  . Smokeless tobacco: Never Used    Substance and Sexual Activity  . Alcohol use: No    Comment: "Wine a couple times a month"  . Drug use: No  . Sexual activity: Yes    Birth control/protection: Surgical  Lifestyle  . Physical activity:    Days per week: 7 days    Minutes per session: 60 min  . Stress: Only a little  Relationships  . Social connections:    Talks on phone: More than three times a week    Gets together: More than three times a week    Attends religious service: Not on file    Active member of club or organization: Not on file    Attends meetings of clubs or organizations: Not on file    Relationship status: Not on file  Other Topics Concern  . Not on file  Social History Narrative   Husband deceased 04-15-2011.      Family History:  The patient's ***family history includes Bipolar disorder in her daughter; Breast cancer in her sister; Colon cancer in her brother; Diabetes in her brother, mother, sister, and sister; Glaucoma in her mother; Heart attack in her brother; Heart failure in her father and mother; Hypertension in her daughter, mother, other, sister, sister, and sister; Other in her other and sister.   Review of Systems:   Please see the history of present illness.     General:  No chills, fever, night sweats or weight changes.  Cardiovascular:  No chest pain, dyspnea on exertion, edema, orthopnea, palpitations, paroxysmal nocturnal dyspnea. Dermatological: No rash, lesions/masses Respiratory: No cough, dyspnea Urologic: No hematuria, dysuria Abdominal:   No nausea, vomiting, diarrhea, bright red blood per rectum, melena, or hematemesis Neurologic:  No visual changes, wkns, changes in mental status. All other systems reviewed and are otherwise negative except as noted above.   Physical Exam:    VS:  There were no  vitals taken for this visit.   General: Well developed, well nourished,female appearing in no acute distress. Head: Normocephalic, atraumatic, sclera non-icteric, no xanthomas,  nares are without discharge.  Neck: No carotid bruits. JVD not elevated.  Lungs: Respirations regular and unlabored, without wheezes or rales.  Heart: ***Regular rate and rhythm. No S3 or S4.  No murmur, no rubs, or gallops appreciated. Abdomen: Soft, non-tender, non-distended with normoactive bowel sounds. No hepatomegaly. No rebound/guarding. No obvious abdominal masses. Msk:  Strength and tone appear normal for age. No joint deformities or effusions. Extremities: No clubbing or cyanosis. No edema.  Distal pedal pulses are 2+ bilaterally. Neuro: Alert and oriented X 3. Moves all extremities spontaneously. No focal deficits noted. Psych:  Responds to questions appropriately with a normal affect. Skin: No rashes or lesions noted  Wt Readings from Last 3 Encounters:  08/18/18 142 lb 8 oz (64.6 kg)  08/04/18 136 lb 6.4 oz (61.9 kg)  07/20/18 136 lb (61.7 kg)        Studies/Labs Reviewed:   EKG:  EKG is*** ordered today.  The ekg ordered today demonstrates ***  Recent Labs: 08/09/2018: ALT 16; B Natriuretic Peptide 489.1; TSH 116.220 08/12/2018: Magnesium 2.4 08/15/2018: Hemoglobin 9.7; Platelets 207 08/18/2018: BUN 34; Creatinine, Ser 1.18; Potassium 3.9; Sodium 139   Lipid Panel    Component Value Date/Time   CHOL 162 08/09/2018 2339   TRIG 323 (H) 08/09/2018 2339   HDL 36 (L) 08/09/2018 2339   CHOLHDL 4.5 08/09/2018 2339   VLDL 65 (H) 08/09/2018 2339   LDLCALC 61 08/09/2018 2339    Additional studies/ records that were reviewed today include:   Echocardiogram: 07/13/2018 Study Conclusions  - Left ventricle: The cavity size was normal. Wall thickness was   increased increased in a pattern of mild to moderate LVH.   Systolic function was normal. The estimated ejection fraction was   in the range of 50% to 55%. Wall motion was normal; there were no   regional wall motion abnormalities. Doppler parameters are   consistent with abnormal left ventricular relaxation (grade 1    diastolic dysfunction). - Aortic valve: Mildly to moderately calcified annulus. Probably   trileaflet; moderately calcified leaflets. There was mild   regurgitation. Mean gradient (S): 20 mm Hg. Peak gradient (S): 33   mm Hg. VTI ratio of LVOT to aortic valve: 0.19. Possible severe,   low-gradient aortic stenosis. Valve area (VTI): 0.53 cm^2. Valve   area (Vmax): 0.62 cm^2. Valve area (Vmean): 0.54 cm^2. - Mitral valve: Mildly calcified leaflets. There was mild   regurgitation. - Left atrium: The atrium was at the upper limits of normal in   size. - Right atrium: Central venous pressure (est): 3 mm Hg. - Atrial septum: No defect or patent foramen ovale was identified. - Tricuspid valve: There was mild regurgitation. - Pulmonary arteries: PA peak pressure: 15 mm Hg (S). - Pericardium, extracardiac: There was no pericardial effusion.  Cardiac Catheterization: 08/11/2018  Prox RCA lesion is 30% stenosed.  Dist RCA lesion is 30% stenosed.  Ost RPDA to RPDA lesion is 70% stenosed.  Ost Cx to Prox Cx lesion is 20% stenosed.  Ost 2nd Mrg to 2nd Mrg lesion is 20% stenosed.  Prox LAD to Mid LAD lesion is 30% stenosed.  Ost 1st Diag to 1st Diag lesion is 40% stenosed.  Ost Ramus to Ramus lesion is 40% stenosed.   1. Mild to moderate non-obstructive CAD 2. Aortic valve stenosis (peak to peak gradient 16 mmHg,  mean 19.5 mmHg, AVA 1.09 cm2). Severe by echo criteria (AVA 0.53 cm2,DVI 0.19. Stroke volume/bsa 19.4). She is felt to have paradoxical low flow/low gradient AS.   Recommendations: Will continue workup for TAVR. She can probably be discharged home this weekend. Will discuss further testing here in the hospital with our TAVR team. Her CT scans will need to be staged based on her renal function.   Assessment:    No diagnosis found.   Plan:   In order of problems listed above:  1. Severe Aortic Stenosis - ***  2. CAD - ***  3. Chronic Diastolic CHF - ***  4. HTN -  ***  5. Hypothyroidism - TSH elevated to 116 when checked in 07/2018.  6. Stage 3 CKD - Creatinine peaked at 2.44 during recent admission, improved to 1.18 on 08/18/2018.    Medication Adjustments/Labs and Tests Ordered: Current medicines are reviewed at length with the patient today.  Concerns regarding medicines are outlined above.  Medication changes, Labs and Tests ordered today are listed in the Patient Instructions below. There are no Patient Instructions on file for this visit.   Signed, Erma Heritage, PA-C  08/23/2018 8:55 AM    [facility] [address] [place], [place] [address] Phone: [phone]

## 2018-08-24 ENCOUNTER — Encounter: Payer: Self-pay | Admitting: Student

## 2018-08-26 DIAGNOSIS — E7849 Other hyperlipidemia: Secondary | ICD-10-CM | POA: Diagnosis not present

## 2018-08-26 DIAGNOSIS — I2510 Atherosclerotic heart disease of native coronary artery without angina pectoris: Secondary | ICD-10-CM | POA: Diagnosis not present

## 2018-08-26 DIAGNOSIS — I509 Heart failure, unspecified: Secondary | ICD-10-CM | POA: Diagnosis not present

## 2018-08-26 DIAGNOSIS — G894 Chronic pain syndrome: Secondary | ICD-10-CM | POA: Diagnosis not present

## 2018-08-26 DIAGNOSIS — I350 Nonrheumatic aortic (valve) stenosis: Secondary | ICD-10-CM | POA: Diagnosis not present

## 2018-08-26 DIAGNOSIS — Z6828 Body mass index (BMI) 28.0-28.9, adult: Secondary | ICD-10-CM | POA: Diagnosis not present

## 2018-08-26 DIAGNOSIS — E1165 Type 2 diabetes mellitus with hyperglycemia: Secondary | ICD-10-CM | POA: Diagnosis not present

## 2018-08-26 DIAGNOSIS — E669 Obesity, unspecified: Secondary | ICD-10-CM | POA: Diagnosis not present

## 2018-08-26 DIAGNOSIS — I10 Essential (primary) hypertension: Secondary | ICD-10-CM | POA: Diagnosis not present

## 2018-08-26 DIAGNOSIS — J42 Unspecified chronic bronchitis: Secondary | ICD-10-CM | POA: Diagnosis not present

## 2018-08-26 DIAGNOSIS — E782 Mixed hyperlipidemia: Secondary | ICD-10-CM | POA: Diagnosis not present

## 2018-08-26 DIAGNOSIS — I5032 Chronic diastolic (congestive) heart failure: Secondary | ICD-10-CM | POA: Diagnosis not present

## 2018-08-26 DIAGNOSIS — I130 Hypertensive heart and chronic kidney disease with heart failure and stage 1 through stage 4 chronic kidney disease, or unspecified chronic kidney disease: Secondary | ICD-10-CM | POA: Diagnosis not present

## 2018-08-26 DIAGNOSIS — E1122 Type 2 diabetes mellitus with diabetic chronic kidney disease: Secondary | ICD-10-CM | POA: Diagnosis not present

## 2018-08-26 DIAGNOSIS — E1140 Type 2 diabetes mellitus with diabetic neuropathy, unspecified: Secondary | ICD-10-CM | POA: Diagnosis not present

## 2018-08-26 DIAGNOSIS — N183 Chronic kidney disease, stage 3 (moderate): Secondary | ICD-10-CM | POA: Diagnosis not present

## 2018-08-26 DIAGNOSIS — E039 Hypothyroidism, unspecified: Secondary | ICD-10-CM | POA: Diagnosis not present

## 2018-08-26 DIAGNOSIS — M069 Rheumatoid arthritis, unspecified: Secondary | ICD-10-CM | POA: Diagnosis not present

## 2018-08-29 ENCOUNTER — Ambulatory Visit (HOSPITAL_COMMUNITY): Payer: Self-pay | Admitting: Dentistry

## 2018-08-29 ENCOUNTER — Encounter (HOSPITAL_COMMUNITY): Payer: Self-pay | Admitting: Dentistry

## 2018-08-29 VITALS — BP 158/66 | HR 63 | Temp 97.9°F

## 2018-08-29 DIAGNOSIS — K036 Deposits [accretions] on teeth: Secondary | ICD-10-CM

## 2018-08-29 DIAGNOSIS — K029 Dental caries, unspecified: Secondary | ICD-10-CM

## 2018-08-29 DIAGNOSIS — K08199 Complete loss of teeth due to other specified cause, unspecified class: Secondary | ICD-10-CM

## 2018-08-29 DIAGNOSIS — K0530 Chronic periodontitis, unspecified: Secondary | ICD-10-CM

## 2018-08-29 DIAGNOSIS — Z01818 Encounter for other preprocedural examination: Secondary | ICD-10-CM

## 2018-08-29 DIAGNOSIS — K08409 Partial loss of teeth, unspecified cause, unspecified class: Secondary | ICD-10-CM

## 2018-08-29 DIAGNOSIS — I350 Nonrheumatic aortic (valve) stenosis: Secondary | ICD-10-CM

## 2018-08-29 NOTE — Patient Instructions (Addendum)
PLAN: 1. Brush teeth after meals and at bedtime. Floss at bedtime. 2. Use salt water rinses during the day as needed to aid healing. 3. Follow-up with primary dentist for evaluation for restoration on tooth #18, continued periodontal therapy, and evaluation for replacement of missing teeth is indicated after adequate healing. 4. Evaluate patient for need for antibiotic premedication prior to invasive dental procedures after the heart valve surgery per American Heart Association guidelines. 5. The patient is currently cleared for heart valve surgery at this time.   Lenn Cal, DDS

## 2018-08-29 NOTE — Progress Notes (Addendum)
POST OPERATIVE NOTE:  08/29/2018 [name] [name] [id number]  VITALS: BP (!) 158/66 (BP Location: Left Arm)   Pulse 63   Temp 97.9 F (36.6 C)   LABS:  Lab Results  Component Value Date   WBC 8.6 08/15/2018   HGB 9.7 (L) 08/15/2018   HCT 30.0 (L) 08/15/2018   MCV 91.5 08/15/2018   PLT 207 08/15/2018   BMET    Component Value Date/Time   NA 139 08/18/2018 0317   NA 139 08/04/2018 1658   K 3.9 08/18/2018 0317   CL 104 08/18/2018 0317   CO2 25 08/18/2018 0317   GLUCOSE 139 (H) 08/18/2018 0317   BUN 34 (H) 08/18/2018 0317   BUN 21 08/04/2018 1658   CREATININE 1.18 (H) 08/18/2018 0317   CALCIUM 8.4 (L) 08/18/2018 0317   GFRNONAA 45 (L) 08/18/2018 0317   GFRAA 52 (L) 08/18/2018 0317    No results found for: INR, PROTIME No results found for: PTT   [name] [name] is status post extraction of tooth numbers 7, 8, 9 with alveoloplasty and gross debridement of remaining dentition in the operating room 08/17/2018. Patient now presents for reevaluation of healing and suture removal as needed. Patient indicates that her TAVR procedure is currently not scheduled.  SUBJECTIVE: Patient without complaints of pain or bleeding. No stitches remain by her report.  EXAM: Here is no sign of infection, heme, or ooze. Sutures are all gone. Patient is healing in by generalized primary closure in the area 7 through 9. Plaque and calculus noted on the mandibular anterior teeth.  PROCEDURE: The patient was given a chlorhexidine gluconate rinse for 30 seconds. No sutures removed.  ASSESSMENT: Post operative course is consistent with dental procedures performed in the operating room. Loss of teeth due to extraction Dental caries on #18-to be treated by her primary dentist Multiple missing teeth and need evaluation for maxillary acrylic partial denture of other treatment is indicated per discussion with   PLAN: 1. Brush teeth after meals and at bedtime. Floss at bedtime. 2. Use salt  water rinses during the day as needed to aid healing. 3. Follow-up with primary dentist for evaluation for restoration on tooth #18, continued periodontal therapy, and evaluation for replacement of missing teeth is indicated after adequate healing. 4. Evaluate patient for need for antibiotic premedication prior to invasive dental procedures after the heart valve surgery per American Heart Association guidelines. 5. The patient is currently cleared for heart valve surgery at this time.   Lenn Cal, DDS

## 2018-08-31 DIAGNOSIS — M069 Rheumatoid arthritis, unspecified: Secondary | ICD-10-CM | POA: Diagnosis not present

## 2018-08-31 DIAGNOSIS — I2510 Atherosclerotic heart disease of native coronary artery without angina pectoris: Secondary | ICD-10-CM | POA: Diagnosis not present

## 2018-08-31 DIAGNOSIS — E039 Hypothyroidism, unspecified: Secondary | ICD-10-CM | POA: Diagnosis not present

## 2018-08-31 DIAGNOSIS — E1122 Type 2 diabetes mellitus with diabetic chronic kidney disease: Secondary | ICD-10-CM | POA: Diagnosis not present

## 2018-08-31 DIAGNOSIS — E1165 Type 2 diabetes mellitus with hyperglycemia: Secondary | ICD-10-CM | POA: Diagnosis not present

## 2018-08-31 DIAGNOSIS — I350 Nonrheumatic aortic (valve) stenosis: Secondary | ICD-10-CM | POA: Diagnosis not present

## 2018-08-31 DIAGNOSIS — I130 Hypertensive heart and chronic kidney disease with heart failure and stage 1 through stage 4 chronic kidney disease, or unspecified chronic kidney disease: Secondary | ICD-10-CM | POA: Diagnosis not present

## 2018-08-31 DIAGNOSIS — I5032 Chronic diastolic (congestive) heart failure: Secondary | ICD-10-CM | POA: Diagnosis not present

## 2018-08-31 DIAGNOSIS — N183 Chronic kidney disease, stage 3 (moderate): Secondary | ICD-10-CM | POA: Diagnosis not present

## 2018-08-31 DIAGNOSIS — J42 Unspecified chronic bronchitis: Secondary | ICD-10-CM | POA: Diagnosis not present

## 2018-09-05 DIAGNOSIS — E1165 Type 2 diabetes mellitus with hyperglycemia: Secondary | ICD-10-CM | POA: Diagnosis not present

## 2018-09-05 DIAGNOSIS — N183 Chronic kidney disease, stage 3 (moderate): Secondary | ICD-10-CM | POA: Diagnosis not present

## 2018-09-05 DIAGNOSIS — J42 Unspecified chronic bronchitis: Secondary | ICD-10-CM | POA: Diagnosis not present

## 2018-09-05 DIAGNOSIS — I350 Nonrheumatic aortic (valve) stenosis: Secondary | ICD-10-CM | POA: Diagnosis not present

## 2018-09-05 DIAGNOSIS — E1122 Type 2 diabetes mellitus with diabetic chronic kidney disease: Secondary | ICD-10-CM | POA: Diagnosis not present

## 2018-09-05 DIAGNOSIS — I2510 Atherosclerotic heart disease of native coronary artery without angina pectoris: Secondary | ICD-10-CM | POA: Diagnosis not present

## 2018-09-05 DIAGNOSIS — E039 Hypothyroidism, unspecified: Secondary | ICD-10-CM | POA: Diagnosis not present

## 2018-09-05 DIAGNOSIS — I5032 Chronic diastolic (congestive) heart failure: Secondary | ICD-10-CM | POA: Diagnosis not present

## 2018-09-05 DIAGNOSIS — M069 Rheumatoid arthritis, unspecified: Secondary | ICD-10-CM | POA: Diagnosis not present

## 2018-09-05 DIAGNOSIS — I130 Hypertensive heart and chronic kidney disease with heart failure and stage 1 through stage 4 chronic kidney disease, or unspecified chronic kidney disease: Secondary | ICD-10-CM | POA: Diagnosis not present

## 2018-09-07 DIAGNOSIS — I350 Nonrheumatic aortic (valve) stenosis: Secondary | ICD-10-CM | POA: Diagnosis not present

## 2018-09-07 DIAGNOSIS — I130 Hypertensive heart and chronic kidney disease with heart failure and stage 1 through stage 4 chronic kidney disease, or unspecified chronic kidney disease: Secondary | ICD-10-CM | POA: Diagnosis not present

## 2018-09-07 DIAGNOSIS — I2510 Atherosclerotic heart disease of native coronary artery without angina pectoris: Secondary | ICD-10-CM | POA: Diagnosis not present

## 2018-09-07 DIAGNOSIS — M069 Rheumatoid arthritis, unspecified: Secondary | ICD-10-CM | POA: Diagnosis not present

## 2018-09-13 ENCOUNTER — Telehealth: Payer: Self-pay

## 2018-09-13 NOTE — Telephone Encounter (Signed)
[name] from Dr [employer] office called me back and I explained that we cannot proceed with further TAVR evaluation until the pt's diabetes and hypothyroidism has been optimized. She advised me to contact the pt and have them arrange a follow-up apt with Dr Gerarda Fraction to discuss and have labs drawn.    I spoke with the pt's daughter, [name] [name], and made her aware that I reached out to the pt's PCP to assist in managing diabetes and hypothyroidism. She plans to call Dr Nolon Rod office and arrange additional follow-up so that TAVR work up can continue.  I provided her with my phone number and she will contact me after apt with Dr Gerarda Fraction so that I can obtain records and discuss with Multidisciplinary Structural Heart Team.

## 2018-09-13 NOTE — Telephone Encounter (Signed)
The pt was evaluated by Dr Gerarda Fraction post hospital on 08/26/2018.  I obtained a copy of office note but did not see any documentation in regards to rechecking the pt's TSH and HgbA1c.  Prior to proceeding with further TAVR evaluation the pt needs medical optimization.  I contacted Dr Nolon Rod office and left a message for a staff member to contact me in regards to following up on TSH and Diabetes. Will await return call.

## 2018-09-13 NOTE — Telephone Encounter (Signed)
Thanks Lauren. I appreciate you following up on this. Gerald Stabs

## 2018-09-14 DIAGNOSIS — I2510 Atherosclerotic heart disease of native coronary artery without angina pectoris: Secondary | ICD-10-CM | POA: Diagnosis not present

## 2018-09-14 DIAGNOSIS — E1165 Type 2 diabetes mellitus with hyperglycemia: Secondary | ICD-10-CM | POA: Diagnosis not present

## 2018-09-14 DIAGNOSIS — E1122 Type 2 diabetes mellitus with diabetic chronic kidney disease: Secondary | ICD-10-CM | POA: Diagnosis not present

## 2018-09-14 DIAGNOSIS — I5032 Chronic diastolic (congestive) heart failure: Secondary | ICD-10-CM | POA: Diagnosis not present

## 2018-09-14 DIAGNOSIS — N183 Chronic kidney disease, stage 3 (moderate): Secondary | ICD-10-CM | POA: Diagnosis not present

## 2018-09-14 DIAGNOSIS — E039 Hypothyroidism, unspecified: Secondary | ICD-10-CM | POA: Diagnosis not present

## 2018-09-14 DIAGNOSIS — M069 Rheumatoid arthritis, unspecified: Secondary | ICD-10-CM | POA: Diagnosis not present

## 2018-09-14 DIAGNOSIS — I350 Nonrheumatic aortic (valve) stenosis: Secondary | ICD-10-CM | POA: Diagnosis not present

## 2018-09-14 DIAGNOSIS — J42 Unspecified chronic bronchitis: Secondary | ICD-10-CM | POA: Diagnosis not present

## 2018-09-14 DIAGNOSIS — I130 Hypertensive heart and chronic kidney disease with heart failure and stage 1 through stage 4 chronic kidney disease, or unspecified chronic kidney disease: Secondary | ICD-10-CM | POA: Diagnosis not present

## 2018-09-19 DIAGNOSIS — M069 Rheumatoid arthritis, unspecified: Secondary | ICD-10-CM | POA: Diagnosis not present

## 2018-09-19 DIAGNOSIS — Z7984 Long term (current) use of oral hypoglycemic drugs: Secondary | ICD-10-CM | POA: Diagnosis not present

## 2018-09-19 DIAGNOSIS — I509 Heart failure, unspecified: Secondary | ICD-10-CM | POA: Diagnosis not present

## 2018-09-19 DIAGNOSIS — R0602 Shortness of breath: Secondary | ICD-10-CM | POA: Diagnosis not present

## 2018-09-19 DIAGNOSIS — J441 Chronic obstructive pulmonary disease with (acute) exacerbation: Secondary | ICD-10-CM | POA: Diagnosis not present

## 2018-09-19 DIAGNOSIS — I110 Hypertensive heart disease with heart failure: Secondary | ICD-10-CM | POA: Diagnosis not present

## 2018-09-19 DIAGNOSIS — M1990 Unspecified osteoarthritis, unspecified site: Secondary | ICD-10-CM | POA: Diagnosis not present

## 2018-09-19 DIAGNOSIS — Z79899 Other long term (current) drug therapy: Secondary | ICD-10-CM | POA: Diagnosis not present

## 2018-09-19 DIAGNOSIS — E039 Hypothyroidism, unspecified: Secondary | ICD-10-CM | POA: Diagnosis not present

## 2018-09-19 DIAGNOSIS — Z7982 Long term (current) use of aspirin: Secondary | ICD-10-CM | POA: Diagnosis not present

## 2018-09-19 DIAGNOSIS — E1165 Type 2 diabetes mellitus with hyperglycemia: Secondary | ICD-10-CM | POA: Diagnosis not present

## 2018-09-21 DIAGNOSIS — N183 Chronic kidney disease, stage 3 (moderate): Secondary | ICD-10-CM | POA: Diagnosis not present

## 2018-09-21 DIAGNOSIS — M069 Rheumatoid arthritis, unspecified: Secondary | ICD-10-CM | POA: Diagnosis not present

## 2018-09-21 DIAGNOSIS — I350 Nonrheumatic aortic (valve) stenosis: Secondary | ICD-10-CM | POA: Diagnosis not present

## 2018-09-21 DIAGNOSIS — I5032 Chronic diastolic (congestive) heart failure: Secondary | ICD-10-CM | POA: Diagnosis not present

## 2018-09-21 DIAGNOSIS — E1122 Type 2 diabetes mellitus with diabetic chronic kidney disease: Secondary | ICD-10-CM | POA: Diagnosis not present

## 2018-09-21 DIAGNOSIS — E1165 Type 2 diabetes mellitus with hyperglycemia: Secondary | ICD-10-CM | POA: Diagnosis not present

## 2018-09-21 DIAGNOSIS — I2510 Atherosclerotic heart disease of native coronary artery without angina pectoris: Secondary | ICD-10-CM | POA: Diagnosis not present

## 2018-09-21 DIAGNOSIS — I130 Hypertensive heart and chronic kidney disease with heart failure and stage 1 through stage 4 chronic kidney disease, or unspecified chronic kidney disease: Secondary | ICD-10-CM | POA: Diagnosis not present

## 2018-09-21 DIAGNOSIS — E039 Hypothyroidism, unspecified: Secondary | ICD-10-CM | POA: Diagnosis not present

## 2018-09-21 DIAGNOSIS — J42 Unspecified chronic bronchitis: Secondary | ICD-10-CM | POA: Diagnosis not present

## 2018-09-22 DIAGNOSIS — I5032 Chronic diastolic (congestive) heart failure: Secondary | ICD-10-CM | POA: Diagnosis not present

## 2018-09-22 DIAGNOSIS — E039 Hypothyroidism, unspecified: Secondary | ICD-10-CM | POA: Diagnosis not present

## 2018-09-22 DIAGNOSIS — E1165 Type 2 diabetes mellitus with hyperglycemia: Secondary | ICD-10-CM | POA: Diagnosis not present

## 2018-09-22 DIAGNOSIS — E1122 Type 2 diabetes mellitus with diabetic chronic kidney disease: Secondary | ICD-10-CM | POA: Diagnosis not present

## 2018-09-22 DIAGNOSIS — J42 Unspecified chronic bronchitis: Secondary | ICD-10-CM | POA: Diagnosis not present

## 2018-09-22 DIAGNOSIS — I130 Hypertensive heart and chronic kidney disease with heart failure and stage 1 through stage 4 chronic kidney disease, or unspecified chronic kidney disease: Secondary | ICD-10-CM | POA: Diagnosis not present

## 2018-09-22 DIAGNOSIS — N183 Chronic kidney disease, stage 3 (moderate): Secondary | ICD-10-CM | POA: Diagnosis not present

## 2018-09-22 DIAGNOSIS — I350 Nonrheumatic aortic (valve) stenosis: Secondary | ICD-10-CM | POA: Diagnosis not present

## 2018-09-22 DIAGNOSIS — I2510 Atherosclerotic heart disease of native coronary artery without angina pectoris: Secondary | ICD-10-CM | POA: Diagnosis not present

## 2018-09-22 DIAGNOSIS — M069 Rheumatoid arthritis, unspecified: Secondary | ICD-10-CM | POA: Diagnosis not present

## 2018-09-23 ENCOUNTER — Encounter: Payer: Self-pay | Admitting: Physician Assistant

## 2018-09-23 ENCOUNTER — Ambulatory Visit (INDEPENDENT_AMBULATORY_CARE_PROVIDER_SITE_OTHER): Payer: Medicare HMO | Admitting: Physician Assistant

## 2018-09-23 ENCOUNTER — Encounter: Payer: Self-pay | Admitting: *Deleted

## 2018-09-23 VITALS — BP 144/66 | HR 81 | Ht <= 58 in | Wt 141.0 lb

## 2018-09-23 DIAGNOSIS — I5032 Chronic diastolic (congestive) heart failure: Secondary | ICD-10-CM

## 2018-09-23 DIAGNOSIS — I10 Essential (primary) hypertension: Secondary | ICD-10-CM | POA: Diagnosis not present

## 2018-09-23 DIAGNOSIS — K029 Dental caries, unspecified: Secondary | ICD-10-CM | POA: Diagnosis not present

## 2018-09-23 DIAGNOSIS — I350 Nonrheumatic aortic (valve) stenosis: Secondary | ICD-10-CM

## 2018-09-23 DIAGNOSIS — K047 Periapical abscess without sinus: Secondary | ICD-10-CM

## 2018-09-23 NOTE — Patient Instructions (Addendum)
Medication Instructions:  Your physician recommends that you continue on your current medications as directed. Please refer to the Current Medication list given to you today.  Take HCTZ for weight gain of 3 pounds over night or 5 pounds in one week. Goal weight is 140-143 lbs.   Take Colace Daily do not take if you have diarrhea. Do not strain when having a bowel movement.    Labwork: NONE   Testing/Procedures: NONE   Follow-Up:   Any Other Special Instructions Will Be Listed Below (If Applicable).  Your physician recommends that you weigh, daily, at the same time every day, and in the same amount of clothing. Please record your daily weights on the handout provided and bring it to your next appointment.  Please call office with dose of HCTZ.      If you need a refill on your cardiac medications before your next appointment, please call your pharmacy.  Thank you for choosing [facility]!

## 2018-09-23 NOTE — Progress Notes (Signed)
Cardiology Office Note   Date:  09/23/2018   ID:  [name] [name], DOB 03-17-1946, MRN [id number]  PCP:  Redmond School, MD Cardiologist:  Kate Sable, MD 07/20/2018 TAVR: Dr. Angelena Form 08/11/2018 in hospital Rosaria Ferries, PA-C   No chief complaint on file.   History of Present Illness: [name] [name] is a 72 y.o. female with a history of severe AS, undergoing TAVR workup (no surgery till DM and thyroid issues improved), D-CHF, DM, HTN, HLD, RA, hypothyroid, anemia  Patent admitted to [facility] 8/26 and was transferred to [facility].  She was diuresed.  She then had a right and left heart catheterization.  Enalapril held due to AKI (peak creatinine 2.44) patient had multiple extractions and debridement, remove sutures in 7-10 days (done), levothyroxine restarted for TSH 116, recheck in 6 weeks 10/1 phone notes showed patient had not seen Dr. Gerarda Fraction since discharge  [name] presents for cardiology follow up.  She is being more diet compliant. She saw Dr Gerarda Fraction, and has had labs rechecked at [facility]. She went there for wheezing, dx COPD exacerbation. Family will bring those by.   No problems w/ dental surgery.   She is not weighing daily, her scales have disappeared. Family says they will get her some scales.   She has problems related to aspiration periodically, says related to arthritis and structure of her upper airway. She is able to swallow pills.   She has nocturia several times a night. Denies orthopnea or PND. She denies waking w/ LE edema. Weight is stable  Balance is not good, but she has not fallen since July. Is walking better, using a cane.   She has problems w/ chronic constipation. Has to take something regularly, but does not take anything daily.   Past Medical History:  Diagnosis Date  . Anemia   . Anxiety   . Aortic stenosis   . Arthritis   . Blood transfusion without reported diagnosis   . Bronchitis   . CHF  (congestive heart failure) ([place])   . CKD (chronic kidney disease) stage 3, GFR 30-59 ml/min (HCC) 08/18/2018  . Depression   . Diabetes mellitus   . Diverticulitis   . Dyspnea   . Glaucoma   . Heart murmur   . Hyperlipidemia   . Hypertension   . Hypothyroidism   . Macular degeneration   . Melanoma ([place])    left thigh, 2010  . Osteoarthritis   . Peptic ulcer   . Pre-diabetes   . Rheumatoid arthritis(714.0)   . Thyroid disease     Past Surgical History:  Procedure Laterality Date  . ABDOMINAL HYSTERECTOMY  1988  . APPENDECTOMY    . BACK SURGERY  07/2008   fusion  . BACK SURGERY  06/6282   complicated by post-op anemia requiring transfusion  . BREAST SURGERY     right breast biopsy  . CATARACT EXTRACTION    . CHOLECYSTECTOMY  1974  . COLONOSCOPY  06/06/2007   Proximal diminutive rectal polyps cold biopsied/removed, otherwise normal rectum/ Left-sided diverticula, the remainder of the colonic mucosa appeared normal. Path-tubular adnemoa.   . COLONOSCOPY  09/14/2012   TDV:VOHYWV polyps-removed as described  above Colonic diverticulosis. Poor prep. next TCS five years.   . COLONOSCOPY N/A 10/29/2016   Procedure: COLONOSCOPY;  Surgeon: Daneil Dolin, MD;  [facility];  Service: Endoscopy;  Laterality: N/A;  2:00 pm  . ESOPHAGOGASTRODUODENOSCOPY  09/2012   Rourk: gastric ulcer, gastric duodenal erosions,  small hh. benign bx  . ESOPHAGOGASTRODUODENOSCOPY N/A 10/29/2016   Procedure: ESOPHAGOGASTRODUODENOSCOPY (EGD);  Surgeon: Daneil Dolin, MD;  [facility];  Service: Endoscopy;  Laterality: N/A;  . EYE SURGERY    . FOOT SURGERY  1994   left   . LUMBAR SPINE SURGERY  08/04/2014   dr Joya Salm  . macular degen    . MALONEY DILATION N/A 10/29/2016   Procedure: Venia Minks DILATION;  Surgeon: Daneil Dolin, MD;  [facility];  Service: Endoscopy;  Laterality: N/A;  . MELANOMA EXCISION  2010  . MULTIPLE EXTRACTIONS WITH ALVEOLOPLASTY N/A 08/17/2018    Procedure: Extraction of tooth #'s 7,8, and 9 with alveoloplasty and gross debridement of remaining teeth;  Surgeon: Lenn Cal, DDS;  Location: [place];  Service: Oral Surgery;  Laterality: N/A;  . RIGHT/LEFT HEART CATH AND CORONARY ANGIOGRAPHY N/A 08/11/2018   Procedure: RIGHT/LEFT HEART CATH AND CORONARY ANGIOGRAPHY;  Surgeon: Burnell Blanks, MD;  Location: [place] [facility];  Service: Cardiovascular;  Laterality: N/A;  . YAG LASER APPLICATION     OS    Current Outpatient Medications  Medication Sig Dispense Refill  . acetaminophen (TYLENOL 8 HOUR ARTHRITIS PAIN) 650 MG CR tablet Take 1,300 mg by mouth every 8 (eight) hours as needed for pain.    Marland Kitchen ALPRAZolam (XANAX) 0.5 MG tablet Take 0.5 mg by mouth at bedtime as needed for sleep.     Marland Kitchen amLODipine (NORVASC) 10 MG tablet Take 1 tablet (10 mg total) by mouth at bedtime. 90 tablet 3  . aspirin EC 81 MG tablet Take 81 mg by mouth daily.     . beta carotene w/minerals (OCUVITE) tablet Take 1 tablet by mouth daily.    . chlorhexidine (PERIDEX) 0.12 % solution Use as directed 15 mLs in the mouth or throat 2 (two) times daily. 120 mL 0  . Cholecalciferol ([employer] VIT D 5000 HIGH-POTENCY) 5000 UNITS capsule Take 5,000 Units by mouth daily.    . cimetidine (TAGAMET) 200 MG tablet Take 200 mg by mouth daily as needed (acid reflux/indigestion.).     Marland Kitchen Cinnamon 500 MG capsule Take 500 mg by mouth daily.    . ferrous sulfate 325 (65 FE) MG tablet Take 325 mg by mouth daily with breakfast.    . gabapentin (NEURONTIN) 300 MG capsule Take 600 mg by mouth 3 (three) times daily.     Marland Kitchen glipiZIDE (GLUCOTROL) 10 MG tablet Take 10 mg by mouth daily.     Marland Kitchen HYDROcodone-acetaminophen (NORCO) 10-325 MG tablet Take 1 tablet by mouth every 8 (eight) hours as needed for moderate pain.     Marland Kitchen latanoprost (XALATAN) 0.005 % ophthalmic solution Place 1 drop into both eyes at bedtime.     Marland Kitchen levothyroxine (SYNTHROID, LEVOTHROID) 112 MCG tablet Take 112 mcg by  mouth daily before breakfast.    . Liniments (SALONPAS PAIN RELIEF PATCH EX) Apply 1 patch topically every 12 (twelve) hours as needed (pain.).    Marland Kitchen metFORMIN (GLUCOPHAGE) 1000 MG tablet Take 0.5 tablets (500 mg total) by mouth 2 (two) times daily with a meal. 45 tablet 3  . Omega-3 Fatty Acids (FISH OIL) 1000 MG CAPS Take 1,000 mg by mouth daily.     . potassium chloride SA (K-DUR,KLOR-CON) 20 MEQ tablet Take 20 mEq by mouth daily.     . predniSONE (DELTASONE) 10 MG tablet Take 1 tablet (10 mg total) by mouth daily with breakfast. 1 tablet 0  . Prenatal Vit-Fe  Fumarate-FA (PRENATAL MULTIVITAMIN) TABS Take 1 tablet by mouth daily.    . simvastatin (ZOCOR) 20 MG tablet Take 20 mg by mouth at bedtime.      . timolol (TIMOPTIC) 0.5 % ophthalmic solution Place 1 drop into both eyes 2 (two) times daily.     . VENTOLIN HFA 108 (90 Base) MCG/ACT inhaler Inhale 2 puffs into the lungs every 4 (four) hours as needed for wheezing or shortness of breath.   0  . vitamin C (ASCORBIC ACID) 500 MG tablet Take 500 mg by mouth daily.    . vitamin E 400 UNIT capsule Take 400 Units by mouth daily.     No current facility-administered medications for this visit.     Allergies:   Patient has no known allergies.    Social History:  The patient  reports that she has never smoked. She has never used smokeless tobacco. She reports that she does not drink alcohol or use drugs.   Family History:  The patient's family history includes Bipolar disorder in her daughter; Breast cancer in her sister; Colon cancer in her brother; Diabetes in her brother, mother, sister, and sister; Glaucoma in her mother; Heart attack in her brother; Heart failure in her father and mother; Hypertension in her daughter, mother, other, sister, sister, and sister; Other in her other and sister.  She indicated that her mother is deceased. She indicated that her father is deceased. She indicated that all of her five sisters are alive. She indicated  that two of her three brothers are alive. She indicated that her maternal grandmother is deceased. She indicated that her maternal grandfather is deceased. She indicated that her paternal grandmother is deceased. She indicated that her paternal grandfather is deceased. She indicated that both of her daughters are alive. She indicated that the status of her neg hx is unknown. She indicated that her other is alive.    ROS:  Please see the history of present illness. All other systems are reviewed and negative.    PHYSICAL EXAM: VS:  BP (!) 144/66   Pulse 81   Ht 4\' 9"  (1.448 m)   Wt 141 lb (64 kg)   SpO2 97%   BMI 30.51 kg/m  , BMI Body mass index is 30.51 kg/m. GEN: Well nourished, well developed, female in no acute distress HEENT: normal for age  Neck: no JVD, no carotid bruit, no masses Cardiac: RRR; 2/6 typical AS murmur, no rubs, or gallops Respiratory: few rales bases bilaterally, normal work of breathing GI: soft, nontender, nondistended, + BS MS: no deformity or atrophy; trace pedal edema; distal pulses are 2+ in all 4 extremities  Skin: warm and dry, no rash Neuro:  Strength and sensation are intact Psych: euthymic mood, full affect   EKG:  EKG is ordered today.  ECHO: 07/13/2018 - Left ventricle: The cavity size was normal. Wall thickness was   increased increased in a pattern of mild to moderate LVH.   Systolic function was normal. The estimated ejection fraction was   in the range of 50% to 55%. Wall motion was normal; there were no   regional wall motion abnormalities. Doppler parameters are   consistent with abnormal left ventricular relaxation (grade 1   diastolic dysfunction). - Aortic valve: Mildly to moderately calcified annulus. Probably   trileaflet; moderately calcified leaflets. There was mild   regurgitation. Mean gradient (S): 20 mm Hg. Peak gradient (S): 33   mm Hg. VTI ratio of LVOT to aortic valve:  0.19. Possible severe,   low-gradient aortic  stenosis. Valve area (VTI): 0.53 cm^2. Valve   area (Vmax): 0.62 cm^2. Valve area (Vmean): 0.54 cm^2. - Mitral valve: Mildly calcified leaflets. There was mild   regurgitation. - Left atrium: The atrium was at the upper limits of normal in size. - Right atrium: Central venous pressure (est): 3 mm Hg. - Atrial septum: No defect or patent foramen ovale was identified. - Tricuspid valve: There was mild regurgitation. - Pulmonary arteries: PA peak pressure: 15 mm Hg (S). - Pericardium, extracardiac: There was no pericardial effusion.  Right and left heart cath 08/11/18  Prox RCA lesion is 30% stenosed.  Dist RCA lesion is 30% stenosed.  Ost RPDA to RPDA lesion is 70% stenosed.  Ost Cx to Prox Cx lesion is 20% stenosed.  Ost 2nd Mrg to 2nd Mrg lesion is 20% stenosed.  Prox LAD to Mid LAD lesion is 30% stenosed.  Ost 1st Diag to 1st Diag lesion is 40% stenosed.  Ost Ramus to Ramus lesion is 40% stenosed.  1. Mild to moderate non-obstructive CAD 2. Aortic valve stenosis (peak to peak gradient 16 mmHg, mean 19.5 mmHg, AVA 1.09 cm2). Severe by echo criteria (AVA 0.53 cm2,DVI 0.19. Stroke volume/bsa 19.4). She is felt to have paradoxical low flow/low gradient AS.    Recent Labs: 08/09/2018: ALT 16; B Natriuretic Peptide 489.1; TSH 116.220 08/12/2018: Magnesium 2.4 08/15/2018: Hemoglobin 9.7; Platelets 207 08/18/2018: BUN 34; Creatinine, Ser 1.18; Potassium 3.9; Sodium 139  CBC    Component Value Date/Time   WBC 8.6 08/15/2018 0328   RBC 3.28 (L) 08/15/2018 0328   HGB 9.7 (L) 08/15/2018 0328   HGB 13.0 08/04/2018 1658   HCT 30.0 (L) 08/15/2018 0328   HCT 38.6 08/04/2018 1658   PLT 207 08/15/2018 0328   PLT 284 08/04/2018 1658   MCV 91.5 08/15/2018 0328   MCV 87 08/04/2018 1658   MCH 29.6 08/15/2018 0328   MCHC 32.3 08/15/2018 0328   RDW 13.3 08/15/2018 0328   RDW 14.9 08/04/2018 1658   LYMPHSABS 2.0 08/09/2018 1200   MONOABS 0.6 08/09/2018 1200   EOSABS 0.2 08/09/2018 1200     BASOSABS 0.1 08/09/2018 1200   CMP Latest Ref Rng & Units 08/18/2018 08/17/2018 08/15/2018  Glucose 70 - 99 mg/dL 139(H) 238(H) 313(H)  BUN 8 - 23 mg/dL 34(H) 29(H) 34(H)  Creatinine 0.44 - 1.00 mg/dL 1.18(H) 1.44(H) 2.02(H)  Sodium 135 - 145 mmol/L 139 138 136  Potassium 3.5 - 5.1 mmol/L 3.9 4.7 4.9  Chloride 98 - 111 mmol/L 104 101 102  CO2 22 - 32 mmol/L 25 25 26   Calcium 8.9 - 10.3 mg/dL 8.4(L) 8.2(L) 8.1(L)  Total Protein 6.5 - 8.1 g/dL - - -  Total Bilirubin 0.3 - 1.2 mg/dL - - -  Alkaline Phos 38 - 126 U/L - - -  AST 15 - 41 U/L - - -  ALT 0 - 44 U/L - - -     Lipid Panel    Component Value Date/Time   CHOL 162 08/09/2018 2339   TRIG 323 (H) 08/09/2018 2339   HDL 36 (L) 08/09/2018 2339   CHOLHDL 4.5 08/09/2018 2339   VLDL 65 (H) 08/09/2018 2339   LDLCALC 61 08/09/2018 2339     Wt Readings from Last 3 Encounters:  09/23/18 141 lb (64 kg)  08/18/18 142 lb 8 oz (64.6 kg)  08/04/18 136 lb 6.4 oz (61.9 kg)     Other studies Reviewed: Additional studies/ records  that were reviewed today include: office notes, hospital records and testing.  ASSESSMENT AND PLAN:  1.  Severe AS: f/u with TAVR clinic as soon as A1c and TSH are improved enough. Family to bring labs by on Monday.  2. Chronic Diastolic CHF: Pt volume status is stable, she takes HCTZ (dose unknown) prn, continue this but weigh daily, take rx for wt gain 3 lbs in a day or 5 lbs in a week. Family to call or bring in the HCTZ so it can be added to rx list. Labs recently done in [place], will get those.   3. HTN: BP is a little high today but pt no longer having orthostatic dizziness, so will allow this. No med changes  4. Dental caries: s/p multiple extractions and healing well.    Current medicines are reviewed at length with the patient today.  The patient has concerns regarding medicines. Concerns were addressed  The following changes have been made:  no change  Labs/ tests ordered today include:  No orders  of the defined types were placed in this encounter.    Disposition:   FU with Kate Sable, MD  Signed, Rosaria Ferries, PA-C  09/23/2018 2:51 PM    [place] Phone: [phone]; Fax: [phone]  This note was written with the assistance of speech recognition software.  Please excuse any transcriptional errors.

## 2018-09-28 DIAGNOSIS — I5032 Chronic diastolic (congestive) heart failure: Secondary | ICD-10-CM | POA: Diagnosis not present

## 2018-09-28 DIAGNOSIS — E039 Hypothyroidism, unspecified: Secondary | ICD-10-CM | POA: Diagnosis not present

## 2018-09-28 DIAGNOSIS — J42 Unspecified chronic bronchitis: Secondary | ICD-10-CM | POA: Diagnosis not present

## 2018-09-28 DIAGNOSIS — I350 Nonrheumatic aortic (valve) stenosis: Secondary | ICD-10-CM | POA: Diagnosis not present

## 2018-09-28 DIAGNOSIS — E1122 Type 2 diabetes mellitus with diabetic chronic kidney disease: Secondary | ICD-10-CM | POA: Diagnosis not present

## 2018-09-28 DIAGNOSIS — I2510 Atherosclerotic heart disease of native coronary artery without angina pectoris: Secondary | ICD-10-CM | POA: Diagnosis not present

## 2018-09-28 DIAGNOSIS — N183 Chronic kidney disease, stage 3 (moderate): Secondary | ICD-10-CM | POA: Diagnosis not present

## 2018-09-28 DIAGNOSIS — I130 Hypertensive heart and chronic kidney disease with heart failure and stage 1 through stage 4 chronic kidney disease, or unspecified chronic kidney disease: Secondary | ICD-10-CM | POA: Diagnosis not present

## 2018-09-28 DIAGNOSIS — M069 Rheumatoid arthritis, unspecified: Secondary | ICD-10-CM | POA: Diagnosis not present

## 2018-09-28 DIAGNOSIS — E1165 Type 2 diabetes mellitus with hyperglycemia: Secondary | ICD-10-CM | POA: Diagnosis not present

## 2018-09-30 DIAGNOSIS — I2510 Atherosclerotic heart disease of native coronary artery without angina pectoris: Secondary | ICD-10-CM | POA: Diagnosis not present

## 2018-09-30 DIAGNOSIS — N183 Chronic kidney disease, stage 3 (moderate): Secondary | ICD-10-CM | POA: Diagnosis not present

## 2018-09-30 DIAGNOSIS — I350 Nonrheumatic aortic (valve) stenosis: Secondary | ICD-10-CM | POA: Diagnosis not present

## 2018-09-30 DIAGNOSIS — E1122 Type 2 diabetes mellitus with diabetic chronic kidney disease: Secondary | ICD-10-CM | POA: Diagnosis not present

## 2018-09-30 DIAGNOSIS — I130 Hypertensive heart and chronic kidney disease with heart failure and stage 1 through stage 4 chronic kidney disease, or unspecified chronic kidney disease: Secondary | ICD-10-CM | POA: Diagnosis not present

## 2018-09-30 DIAGNOSIS — I5032 Chronic diastolic (congestive) heart failure: Secondary | ICD-10-CM | POA: Diagnosis not present

## 2018-09-30 DIAGNOSIS — M069 Rheumatoid arthritis, unspecified: Secondary | ICD-10-CM | POA: Diagnosis not present

## 2018-09-30 DIAGNOSIS — J42 Unspecified chronic bronchitis: Secondary | ICD-10-CM | POA: Diagnosis not present

## 2018-09-30 DIAGNOSIS — E039 Hypothyroidism, unspecified: Secondary | ICD-10-CM | POA: Diagnosis not present

## 2018-09-30 DIAGNOSIS — E1165 Type 2 diabetes mellitus with hyperglycemia: Secondary | ICD-10-CM | POA: Diagnosis not present

## 2018-10-03 DIAGNOSIS — I130 Hypertensive heart and chronic kidney disease with heart failure and stage 1 through stage 4 chronic kidney disease, or unspecified chronic kidney disease: Secondary | ICD-10-CM | POA: Diagnosis not present

## 2018-10-03 DIAGNOSIS — J42 Unspecified chronic bronchitis: Secondary | ICD-10-CM | POA: Diagnosis not present

## 2018-10-03 DIAGNOSIS — I2510 Atherosclerotic heart disease of native coronary artery without angina pectoris: Secondary | ICD-10-CM | POA: Diagnosis not present

## 2018-10-03 DIAGNOSIS — I5032 Chronic diastolic (congestive) heart failure: Secondary | ICD-10-CM | POA: Diagnosis not present

## 2018-10-03 DIAGNOSIS — E039 Hypothyroidism, unspecified: Secondary | ICD-10-CM | POA: Diagnosis not present

## 2018-10-03 DIAGNOSIS — I350 Nonrheumatic aortic (valve) stenosis: Secondary | ICD-10-CM | POA: Diagnosis not present

## 2018-10-03 DIAGNOSIS — M069 Rheumatoid arthritis, unspecified: Secondary | ICD-10-CM | POA: Diagnosis not present

## 2018-10-03 DIAGNOSIS — N183 Chronic kidney disease, stage 3 (moderate): Secondary | ICD-10-CM | POA: Diagnosis not present

## 2018-10-03 DIAGNOSIS — E1122 Type 2 diabetes mellitus with diabetic chronic kidney disease: Secondary | ICD-10-CM | POA: Diagnosis not present

## 2018-10-03 DIAGNOSIS — E1165 Type 2 diabetes mellitus with hyperglycemia: Secondary | ICD-10-CM | POA: Diagnosis not present

## 2018-10-05 DIAGNOSIS — M069 Rheumatoid arthritis, unspecified: Secondary | ICD-10-CM | POA: Diagnosis not present

## 2018-10-05 DIAGNOSIS — E039 Hypothyroidism, unspecified: Secondary | ICD-10-CM | POA: Diagnosis not present

## 2018-10-05 DIAGNOSIS — I130 Hypertensive heart and chronic kidney disease with heart failure and stage 1 through stage 4 chronic kidney disease, or unspecified chronic kidney disease: Secondary | ICD-10-CM | POA: Diagnosis not present

## 2018-10-05 DIAGNOSIS — N183 Chronic kidney disease, stage 3 (moderate): Secondary | ICD-10-CM | POA: Diagnosis not present

## 2018-10-05 DIAGNOSIS — J42 Unspecified chronic bronchitis: Secondary | ICD-10-CM | POA: Diagnosis not present

## 2018-10-05 DIAGNOSIS — E1122 Type 2 diabetes mellitus with diabetic chronic kidney disease: Secondary | ICD-10-CM | POA: Diagnosis not present

## 2018-10-05 DIAGNOSIS — I2510 Atherosclerotic heart disease of native coronary artery without angina pectoris: Secondary | ICD-10-CM | POA: Diagnosis not present

## 2018-10-05 DIAGNOSIS — I5032 Chronic diastolic (congestive) heart failure: Secondary | ICD-10-CM | POA: Diagnosis not present

## 2018-10-05 DIAGNOSIS — E1165 Type 2 diabetes mellitus with hyperglycemia: Secondary | ICD-10-CM | POA: Diagnosis not present

## 2018-10-05 DIAGNOSIS — I350 Nonrheumatic aortic (valve) stenosis: Secondary | ICD-10-CM | POA: Diagnosis not present

## 2018-10-05 NOTE — Telephone Encounter (Signed)
8/27 HgbA1c 7.8, TSH 116  10/7 HgbA1c 7.5, TSH 54.16   Pt was scheduled to see her PCP Dr Gerarda Fraction on 10/16 for follow-up and no showed for this appointment per office staff. I reviewed labs with Dr Angelena Form and currently the pt is scheduled to see Dr Cyndia Bent 10/12/18. Diabetes and TSH are not optimized at this time. Dr Angelena Form advised that the pt should continue to follow-up with PCP for optimization and apt with Dr Cyndia Bent should be postponed. I left a message for the pt's daughter to contact me in regards to plan.

## 2018-10-05 NOTE — Telephone Encounter (Signed)
I spoke with the pt's daughter [name] and made her aware of Dr Camillia Herter recommendation.  I advised her that the pt needs to follow-up with her PCP in November for an appointment and repeat lab work.  I have rescheduled the pt's appointment with Dr Cyndia Bent to 11/16/2018 at 11:30 AM for further TAVR Evaluation dependent upon optimization of Diabetes and TSH. [name] agreed with plan.

## 2018-10-12 ENCOUNTER — Encounter: Payer: Medicare HMO | Admitting: Surgery

## 2018-10-12 DIAGNOSIS — I350 Nonrheumatic aortic (valve) stenosis: Secondary | ICD-10-CM | POA: Diagnosis not present

## 2018-10-12 DIAGNOSIS — E1122 Type 2 diabetes mellitus with diabetic chronic kidney disease: Secondary | ICD-10-CM | POA: Diagnosis not present

## 2018-10-12 DIAGNOSIS — E1165 Type 2 diabetes mellitus with hyperglycemia: Secondary | ICD-10-CM | POA: Diagnosis not present

## 2018-10-12 DIAGNOSIS — I2510 Atherosclerotic heart disease of native coronary artery without angina pectoris: Secondary | ICD-10-CM | POA: Diagnosis not present

## 2018-10-12 DIAGNOSIS — J42 Unspecified chronic bronchitis: Secondary | ICD-10-CM | POA: Diagnosis not present

## 2018-10-12 DIAGNOSIS — I5032 Chronic diastolic (congestive) heart failure: Secondary | ICD-10-CM | POA: Diagnosis not present

## 2018-10-12 DIAGNOSIS — N183 Chronic kidney disease, stage 3 (moderate): Secondary | ICD-10-CM | POA: Diagnosis not present

## 2018-10-12 DIAGNOSIS — I130 Hypertensive heart and chronic kidney disease with heart failure and stage 1 through stage 4 chronic kidney disease, or unspecified chronic kidney disease: Secondary | ICD-10-CM | POA: Diagnosis not present

## 2018-10-12 DIAGNOSIS — E039 Hypothyroidism, unspecified: Secondary | ICD-10-CM | POA: Diagnosis not present

## 2018-10-12 DIAGNOSIS — M069 Rheumatoid arthritis, unspecified: Secondary | ICD-10-CM | POA: Diagnosis not present

## 2018-10-13 DIAGNOSIS — E1122 Type 2 diabetes mellitus with diabetic chronic kidney disease: Secondary | ICD-10-CM | POA: Diagnosis not present

## 2018-10-13 DIAGNOSIS — I2510 Atherosclerotic heart disease of native coronary artery without angina pectoris: Secondary | ICD-10-CM | POA: Diagnosis not present

## 2018-10-13 DIAGNOSIS — N183 Chronic kidney disease, stage 3 (moderate): Secondary | ICD-10-CM | POA: Diagnosis not present

## 2018-10-13 DIAGNOSIS — M069 Rheumatoid arthritis, unspecified: Secondary | ICD-10-CM | POA: Diagnosis not present

## 2018-10-13 DIAGNOSIS — I350 Nonrheumatic aortic (valve) stenosis: Secondary | ICD-10-CM | POA: Diagnosis not present

## 2018-10-13 DIAGNOSIS — E1165 Type 2 diabetes mellitus with hyperglycemia: Secondary | ICD-10-CM | POA: Diagnosis not present

## 2018-10-13 DIAGNOSIS — I130 Hypertensive heart and chronic kidney disease with heart failure and stage 1 through stage 4 chronic kidney disease, or unspecified chronic kidney disease: Secondary | ICD-10-CM | POA: Diagnosis not present

## 2018-10-13 DIAGNOSIS — E039 Hypothyroidism, unspecified: Secondary | ICD-10-CM | POA: Diagnosis not present

## 2018-10-13 DIAGNOSIS — I5032 Chronic diastolic (congestive) heart failure: Secondary | ICD-10-CM | POA: Diagnosis not present

## 2018-10-13 DIAGNOSIS — J42 Unspecified chronic bronchitis: Secondary | ICD-10-CM | POA: Diagnosis not present

## 2018-10-15 ENCOUNTER — Telehealth: Payer: Self-pay | Admitting: Cardiology

## 2018-10-15 DIAGNOSIS — I10 Essential (primary) hypertension: Secondary | ICD-10-CM | POA: Diagnosis not present

## 2018-10-15 DIAGNOSIS — J449 Chronic obstructive pulmonary disease, unspecified: Secondary | ICD-10-CM | POA: Diagnosis not present

## 2018-10-15 DIAGNOSIS — E119 Type 2 diabetes mellitus without complications: Secondary | ICD-10-CM | POA: Diagnosis not present

## 2018-10-15 DIAGNOSIS — R062 Wheezing: Secondary | ICD-10-CM | POA: Diagnosis not present

## 2018-10-15 DIAGNOSIS — I350 Nonrheumatic aortic (valve) stenosis: Secondary | ICD-10-CM | POA: Diagnosis not present

## 2018-10-15 DIAGNOSIS — I110 Hypertensive heart disease with heart failure: Secondary | ICD-10-CM | POA: Diagnosis not present

## 2018-10-15 DIAGNOSIS — I509 Heart failure, unspecified: Secondary | ICD-10-CM | POA: Diagnosis not present

## 2018-10-15 DIAGNOSIS — Z7984 Long term (current) use of oral hypoglycemic drugs: Secondary | ICD-10-CM | POA: Diagnosis not present

## 2018-10-15 DIAGNOSIS — Z7982 Long term (current) use of aspirin: Secondary | ICD-10-CM | POA: Diagnosis not present

## 2018-10-15 DIAGNOSIS — R0602 Shortness of breath: Secondary | ICD-10-CM | POA: Diagnosis not present

## 2018-10-15 DIAGNOSIS — R0902 Hypoxemia: Secondary | ICD-10-CM | POA: Diagnosis not present

## 2018-10-15 DIAGNOSIS — M069 Rheumatoid arthritis, unspecified: Secondary | ICD-10-CM | POA: Diagnosis not present

## 2018-10-15 DIAGNOSIS — Z79899 Other long term (current) drug therapy: Secondary | ICD-10-CM | POA: Diagnosis not present

## 2018-10-15 NOTE — Progress Notes (Signed)
Discussed progress with hospitalist at [facility]. She is much better, but still hypoxemic when they try to take her off supplemental O2. She is not hypotensive and has normal renal function. I recommended additional diuretics. She should be transferred if she is refractory to diuretics or if she develops hypotension/renal insufficiency with diuretics. Well known to our service. She is awaiting resolution of several medical problems before TAVR. She has taken care of her dental problems and her thyroid status is improving. Will have a TSH checked today.  I spoke to our TAVR coordinator about accelerating the workup towards the procedure. Sanda Klein, MD, [facility] [phone] office [phone] pager .

## 2018-10-15 NOTE — Telephone Encounter (Deleted)
Had a discussion with hospitalist. Noted small increase in troponin. He was not aware of the diagnosis of AS or the results of the cardiac cath a few weeks ago. She does have a 70% PDA stenosis and elevated intracardiac pressures. Would not be a surprise for some demand myocardial injury in the setting of CHF exacerbation, BP changes and hypoxia. She never described angina and is currently pain free and breathing better. The hospitalist stated that he is pleased with her progress and other than the abnormal troponin did not see a reason for transfer. I do not think she had a true acute atherothrombotic event, but it sound like she is more vulnerable to HF exacerbations. She has taken care of her dental problems and her thyroid status is improving. Will have a TSH checked today.  I spoke to our TAVR coordinator about accelerating the workup towards the procedure. Transfer to [facility] not warranted at this time.   Sanda Klein, MD, [facility] [phone] office [phone] pager

## 2018-10-15 NOTE — Telephone Encounter (Signed)
I received a call from the ED physician at [facility] regarding the patient.  Ms. Fager has a history of severe AS with ongoing workup for TAVR. She was recently hospitalized for diuresis and TAVR workup. She came to the outside ED this evening with dyspnea. CXR shows mild congestion, and O2 saturation is 94%; ABG paO2 is 55. Her SBP is 170 and HR is wnl.  We discussed different treatment options. At this time, given stable vital signs, the outside provider will give a dose of IV furosemide and monitor response. If she is not symptomatically improved, we will plan for transfer to [facility] for further care.

## 2018-10-18 DIAGNOSIS — E6609 Other obesity due to excess calories: Secondary | ICD-10-CM | POA: Diagnosis not present

## 2018-10-18 DIAGNOSIS — R0600 Dyspnea, unspecified: Secondary | ICD-10-CM | POA: Diagnosis not present

## 2018-10-18 DIAGNOSIS — I350 Nonrheumatic aortic (valve) stenosis: Secondary | ICD-10-CM | POA: Diagnosis not present

## 2018-10-18 DIAGNOSIS — I5033 Acute on chronic diastolic (congestive) heart failure: Secondary | ICD-10-CM | POA: Diagnosis not present

## 2018-10-18 DIAGNOSIS — E039 Hypothyroidism, unspecified: Secondary | ICD-10-CM | POA: Diagnosis not present

## 2018-10-18 DIAGNOSIS — R69 Illness, unspecified: Secondary | ICD-10-CM | POA: Diagnosis not present

## 2018-10-18 DIAGNOSIS — G894 Chronic pain syndrome: Secondary | ICD-10-CM | POA: Diagnosis not present

## 2018-10-18 DIAGNOSIS — E1165 Type 2 diabetes mellitus with hyperglycemia: Secondary | ICD-10-CM | POA: Diagnosis not present

## 2018-10-18 DIAGNOSIS — Z6830 Body mass index (BMI) 30.0-30.9, adult: Secondary | ICD-10-CM | POA: Diagnosis not present

## 2018-10-24 DIAGNOSIS — R69 Illness, unspecified: Secondary | ICD-10-CM | POA: Diagnosis not present

## 2018-10-27 ENCOUNTER — Institutional Professional Consult (permissible substitution) (INDEPENDENT_AMBULATORY_CARE_PROVIDER_SITE_OTHER): Payer: Medicare HMO | Admitting: Thoracic Surgery (Cardiothoracic Vascular Surgery)

## 2018-10-27 ENCOUNTER — Other Ambulatory Visit: Payer: Self-pay

## 2018-10-27 ENCOUNTER — Encounter: Payer: Self-pay | Admitting: Thoracic Surgery (Cardiothoracic Vascular Surgery)

## 2018-10-27 VITALS — BP 162/70 | HR 68 | Resp 20 | Ht <= 58 in | Wt 136.0 lb

## 2018-10-27 DIAGNOSIS — I350 Nonrheumatic aortic (valve) stenosis: Secondary | ICD-10-CM | POA: Diagnosis not present

## 2018-10-27 DIAGNOSIS — R0602 Shortness of breath: Secondary | ICD-10-CM

## 2018-10-27 NOTE — Progress Notes (Signed)
[facility] [place] [facility]  CARDIOTHORACIC SURGERY CONSULTATION REPORT  Referring Provider is Herminio Commons, MD PCP is Redmond School, MD  Chief Complaint  Patient presents with  . Aortic Stenosis    Surgical eval for possible TAVR, review all studies    HPI:  Patient is a 72 year old female with history of aortic stenosis, hypertension, hyperlipidemia, hypothyroidism, type 2 diabetes mellitus, chronic bronchitis, COPD, chronic back pain on long-term opioid pain relievers, and rheumatoid arthritis who has been referred for surgical consultation to discuss treatment options for management of aortic stenosis.  Patient states that she has known of presence of a heart murmur for several years.  She denies any known history of rheumatic fever or rheumatic heart disease.  Patient lives a somewhat sedentary lifestyle and has limited physical mobility.  She has been disabled since 2012 when she underwent her third and most recent back surgery.  She remains on chronic opioid pain relievers for chronic back pain.  She has a long history of poor exercise tolerance and underwent an exercise treadmill test in 2019 that was felt to be nondiagnostic.  She underwent routine transthoracic echocardiogram on July 13, 2018 which revealed normal left ventricular systolic function with ejection fraction estimated 50-55%.  There was aortic stenosis that was felt to possibly represent paradoxical low gradient aortic stenosis with peak velocity across the aortic valve measured 2.85 m/s corresponding to mean transvalvular gradient estimated 20 mmHg.  The DVI was reportedly only 0.19 with aortic valve area calculated 0.53 cm.  There was felt to be mild aortic insufficiency with pressure half-time reported 573 ms.  She was referred to the multidisciplinary heart valve clinic and evaluated by Dr. Angelena Form on August 04, 2018.  She was scheduled for elective diagnostic cardiac  catheterization, but she presented acutely with shortness of breath on August 09, 2018.  She was hospitalized for nearly a week and treated for an acute exacerbation of chronic diastolic congestive heart failure.  She was notably hypertensive during admission and she developed acute kidney injury.  Antihypertensive medications were adjusted.  She was also notably severely hypothyroid with TSH level measured 116.  Prior to that she apparently had not been taking her prescribed dose of Synthroid.  Synthroid was resumed and she underwent diagnostic cardiac catheterization during her admission on August 11, 2018.  She was found to have mild to moderate nonobstructive coronary artery disease.  Peak to peak and mean transvalvular gradients measured at catheterization were reported 16 and 19.5 mmHg, respectively, corresponding to aortic valve area calculated 1.09 cm.  Her cardiac output was measured 5.36 L/min corresponding to a cardiac index reportedly 3.48.  Right heart pressures were mildly elevated.  The patient was noted to have poor dentition during her admission and evaluated by Dr. Lawana Chambers.  She underwent CT angiography and was discharged home.  She later underwent elective dental extraction August 17, 2018.  She recovered from dental extraction uneventfully and was seen in follow-up last month by Rosaria Ferries at [facility].  She was referred for elective surgical consultation.  Patient is widowed and lives with 2 of her daughters and 1 granddaughter in [place].  In the distant past she worked at [employer] and after the company moved out of town she worked for a period of time at [employer].  She has been disabled since 2012.  She ambulates using either a 3 prong cane or walker.  She still drives an automobile intermittently.  She takes opioid narcotic  pain relievers on a daily basis for chronic back pain.  She reports long-standing history of exertional shortness of breath that has rapidly progressed over  the last several months.  She has intermittent productive cough with episodes of bronchitis and wheezing.  She has never been a smoker but she has reportedly been exposed to secondhand smoke all of her adult life.  She gets short of breath with low-level activity.  She has had 2 subsequent brief hospitalizations over the past 6 weeks for acute exacerbation of resting shortness of breath.  She has intermittent mild lower extremity edema.  She does not get chest pain or chest tightness either with activity or at rest.  Past Medical History:  Diagnosis Date  . Anemia   . Anxiety   . Aortic stenosis   . Arthritis   . Blood transfusion without reported diagnosis   . Bronchitis   . CHF (congestive heart failure) ([place])   . CKD (chronic kidney disease) stage 3, GFR 30-59 ml/min (HCC) 08/18/2018  . Depression   . Diabetes mellitus   . Diverticulitis   . Dyspnea   . Glaucoma   . Heart murmur   . Hyperlipidemia   . Hypertension   . Hypothyroidism   . Macular degeneration   . Melanoma ([place])    left thigh, 2010  . Osteoarthritis   . Peptic ulcer   . Pre-diabetes   . Rheumatoid arthritis(714.0)   . Thyroid disease     Past Surgical History:  Procedure Laterality Date  . ABDOMINAL HYSTERECTOMY  1988  . APPENDECTOMY    . BACK SURGERY  07/2008   fusion  . BACK SURGERY  04/6432   complicated by post-op anemia requiring transfusion  . BREAST SURGERY     right breast biopsy  . CATARACT EXTRACTION    . CHOLECYSTECTOMY  1974  . COLONOSCOPY  06/06/2007   Proximal diminutive rectal polyps cold biopsied/removed, otherwise normal rectum/ Left-sided diverticula, the remainder of the colonic mucosa appeared normal. Path-tubular adnemoa.   . COLONOSCOPY  09/14/2012   IRJ:JOACZY polyps-removed as described  above Colonic diverticulosis. Poor prep. next TCS five years.   . COLONOSCOPY N/A 10/29/2016   Procedure: COLONOSCOPY;  Surgeon: Daneil Dolin, MD;  [facility];  Service: Endoscopy;   Laterality: N/A;  2:00 pm  . ESOPHAGOGASTRODUODENOSCOPY  09/2012   Rourk: gastric ulcer, gastric duodenal erosions, small hh. benign bx  . ESOPHAGOGASTRODUODENOSCOPY N/A 10/29/2016   Procedure: ESOPHAGOGASTRODUODENOSCOPY (EGD);  Surgeon: Daneil Dolin, MD;  [facility];  Service: Endoscopy;  Laterality: N/A;  . EYE SURGERY    . FOOT SURGERY  1994   left   . LUMBAR SPINE SURGERY  08/04/2014   dr Joya Salm  . macular degen    . MALONEY DILATION N/A 10/29/2016   Procedure: Venia Minks DILATION;  Surgeon: Daneil Dolin, MD;  [facility];  Service: Endoscopy;  Laterality: N/A;  . MELANOMA EXCISION  2010  . MULTIPLE EXTRACTIONS WITH ALVEOLOPLASTY N/A 08/17/2018   Procedure: Extraction of tooth #'s 7,8, and 9 with alveoloplasty and gross debridement of remaining teeth;  Surgeon: Lenn Cal, DDS;  Location: [place];  Service: Oral Surgery;  Laterality: N/A;  . RIGHT/LEFT HEART CATH AND CORONARY ANGIOGRAPHY N/A 08/11/2018   Procedure: RIGHT/LEFT HEART CATH AND CORONARY ANGIOGRAPHY;  Surgeon: Burnell Blanks, MD;  Location: [place] [facility];  Service: Cardiovascular;  Laterality: N/A;  . YAG LASER APPLICATION     OS  Family History  Problem Relation Age of Onset  . Colon cancer Brother        diagnosed at age 33  . Diabetes Brother   . Hypertension Mother   . Heart failure Mother   . Diabetes Mother   . Glaucoma Mother   . Hypertension Other   . Other Other   . Heart failure Father        passed away of MI in his 77s  . Diabetes Sister   . Hypertension Sister   . Breast cancer Sister   . Heart attack Brother   . Diabetes Sister   . Hypertension Sister   . Other Sister        tumor  . Hypertension Sister   . Hypertension Daughter   . Bipolar disorder Daughter   . Anesthesia problems Neg Hx   . Amblyopia Neg Hx   . Blindness Neg Hx   . Cataracts Neg Hx   . Macular degeneration Neg Hx   . Retinal detachment Neg Hx   . Strabismus Neg Hx   .  Retinitis pigmentosa Neg Hx     Social History   Socioeconomic History  . Marital status: Widowed    Spouse name: Not on file  . Number of children: 2  . Years of education: Not on file  . Highest education level: Not on file  Occupational History  . Not on file  Social Needs  . Financial resource strain: Somewhat hard  . Food insecurity:    Worry: Sometimes true    Inability: Sometimes true  . Transportation needs:    Medical: No    Non-medical: Yes  Tobacco Use  . Smoking status: Never Smoker  . Smokeless tobacco: Never Used  Substance and Sexual Activity  . Alcohol use: No    Comment: "Wine a couple times a month"  . Drug use: No  . Sexual activity: Yes    Birth control/protection: Surgical  Lifestyle  . Physical activity:    Days per week: 7 days    Minutes per session: 60 min  . Stress: Only a little  Relationships  . Social connections:    Talks on phone: More than three times a week    Gets together: More than three times a week    Attends religious service: Not on file    Active member of club or organization: Not on file    Attends meetings of clubs or organizations: Not on file    Relationship status: Not on file  . Intimate partner violence:    Fear of current or ex partner: No    Emotionally abused: No    Physically abused: No    Forced sexual activity: No  Other Topics Concern  . Not on file  Social History Narrative   Husband deceased 2011-04-06.     Current Outpatient Medications  Medication Sig Dispense Refill  . acetaminophen (TYLENOL 8 HOUR ARTHRITIS PAIN) 650 MG CR tablet Take 1,300 mg by mouth every 8 (eight) hours as needed for pain.    Marland Kitchen ALPRAZolam (XANAX) 0.5 MG tablet Take 0.5 mg by mouth at bedtime as needed for sleep.     Marland Kitchen amLODipine (NORVASC) 10 MG tablet Take 1 tablet (10 mg total) by mouth at bedtime. 90 tablet 3  . aspirin EC 81 MG tablet Take 81 mg by mouth daily.     . beta carotene w/minerals (OCUVITE) tablet Take 1 tablet by  mouth daily.    . chlorhexidine (  PERIDEX) 0.12 % solution Use as directed 15 mLs in the mouth or throat 2 (two) times daily. 120 mL 0  . Cholecalciferol (CVS VIT D 5000 HIGH-POTENCY) 5000 UNITS capsule Take 5,000 Units by mouth daily.    . cimetidine (TAGAMET) 200 MG tablet Take 200 mg by mouth daily as needed (acid reflux/indigestion.).     Marland Kitchen Cinnamon 500 MG capsule Take 500 mg by mouth daily.    . ferrous sulfate 325 (65 FE) MG tablet Take 325 mg by mouth daily with breakfast.    . fluticasone (FLONASE) 50 MCG/ACT nasal spray Place into both nostrils daily as needed for allergies or rhinitis.    Marland Kitchen gabapentin (NEURONTIN) 300 MG capsule Take 600 mg by mouth 3 (three) times daily.     Marland Kitchen glipiZIDE (GLUCOTROL) 10 MG tablet Take 10 mg by mouth daily.     Marland Kitchen HYDROcodone-acetaminophen (NORCO) 10-325 MG tablet Take 1 tablet by mouth every 8 (eight) hours as needed for moderate pain.     Marland Kitchen latanoprost (XALATAN) 0.005 % ophthalmic solution Place 1 drop into both eyes at bedtime.     Marland Kitchen levothyroxine (SYNTHROID, LEVOTHROID) 112 MCG tablet Take 112 mcg by mouth daily before breakfast.    . metFORMIN (GLUCOPHAGE) 1000 MG tablet Take 0.5 tablets (500 mg total) by mouth 2 (two) times daily with a meal. 45 tablet 3  . Omega-3 Fatty Acids (FISH OIL) 1000 MG CAPS Take 1,000 mg by mouth daily.     . potassium chloride SA (K-DUR,KLOR-CON) 20 MEQ tablet Take 20 mEq by mouth daily.     . predniSONE (DELTASONE) 10 MG tablet Take 1 tablet (10 mg total) by mouth daily with breakfast. 1 tablet 0  . Prenatal Vit-Fe Fumarate-FA (PRENATAL MULTIVITAMIN) TABS Take 1 tablet by mouth daily.    . simvastatin (ZOCOR) 20 MG tablet Take 20 mg by mouth at bedtime.      . timolol (TIMOPTIC) 0.5 % ophthalmic solution Place 1 drop into both eyes 2 (two) times daily.     . VENTOLIN HFA 108 (90 Base) MCG/ACT inhaler Inhale 2 puffs into the lungs every 4 (four) hours as needed for wheezing or shortness of breath.   0  . vitamin C (ASCORBIC  ACID) 500 MG tablet Take 500 mg by mouth daily.    . vitamin E 400 UNIT capsule Take 400 Units by mouth daily.     No current facility-administered medications for this visit.     No Known Allergies    Review of Systems:   General:  decreased appetite, decreased energy, no weight gain, + weight loss, no fever  Cardiac:  no chest pain with exertion, no chest pain at rest, +SOB with exertion, + resting SOB, no PND, + orthopnea, no palpitations, no arrhythmia, no atrial fibrillation, + LE edema, + dizzy spells, no syncope  Respiratory:  + shortness of breath, no home oxygen, + productive cough, + dry cough, + bronchitis, + wheezing, no hemoptysis, no asthma, no pain with inspiration or cough, no sleep apnea, no CPAP at night  GI:   no difficulty swallowing, no reflux, no frequent heartburn, no hiatal hernia, no abdominal pain, no constipation, no diarrhea, no hematochezia, no hematemesis, no melena  GU:   no dysuria,  no frequency, no urinary tract infection, no hematuria, no kidney stones, + kidney disease  Vascular:  no pain suggestive of claudication, + pain in feet, no leg cramps, no varicose veins, no DVT, no non-healing foot ulcer  Neuro:  no stroke, no TIA's, no seizures, no headaches, no temporary blindness one eye,  no slurred speech, no peripheral neuropathy, + chronic pain, + instability of gait, no memory/cognitive dysfunction  Musculoskeletal: + arthritis, + joint swelling, no myalgias, + difficulty walking, limited mobility   Skin:   no rash, no itching, no skin infections, no pressure sores or ulcerations  Psych:   + anxiety, + depression, + nervousness, + unusual recent stress  Eyes:   no blurry vision, no floaters, no recent vision changes, + wears glasses or contacts  ENT:   no hearing loss, no loose or painful teeth, no dentures, last saw dentist recently  Hematologic:  + easy bruising, no abnormal bleeding, no clotting disorder, no frequent epistaxis  Endocrine:  +  diabetes, does not check CBG's at home           Physical Exam:   BP (!) 162/70   Pulse 68   Resp 20   Ht 4\' 9"  (1.448 m)   Wt 136 lb (61.7 kg)   SpO2 98% Comment: RA  BMI 29.43 kg/m   General:  Mildly obese female NAD, appears older than stated age   35:  Unremarkable   Neck:   no JVD, no bruits, no adenopathy   Chest:   clear to auscultation, symmetrical breath sounds, no wheezes, no rhonchi   CV:   RRR, grade III/VI crescendo/decrescendo murmur heard best at LLSB,  no diastolic murmur  Abdomen:  soft, non-tender, no masses   Extremities:  warm, well-perfused, pulses not palpable, no LE edema  Rectal/GU  Deferred  Neuro:   Grossly non-focal and symmetrical throughout  Skin:   Clean and dry, no rashes, no breakdown   Diagnostic Tests:  Transthoracic Echocardiography  Patient:    [name] [name], [name] [name] MR #:       [id number] Study Date: 07/13/2018 Gender:     F Age:        71 Height:     144.8 cm Weight:     60.6 kg BSA:        1.59 m^2 Pt. Status: Room:   ATTENDING    Rozann Lesches, M.D.  ORDERING     Imogene Burn  REFERRING    Imogene Burn  SONOGRAPHER  Mandy McFatter  PERFORMING   [facility] [place]  cc:  ------------------------------------------------------------------- LV EF: 50% -   55%  ------------------------------------------------------------------- Indications:     I06.10  ------------------------------------------------------------------- History:   PMH:   Chest pain.  Aortic valve disease.  Risk factors:  Lifelong nonsmoker. Hypertension. Diabetes mellitus. Dyslipidemia.   ------------------------------------------------------------------- Study Conclusions  - Left ventricle: The cavity size was normal. Wall thickness was   increased increased in a pattern of mild to moderate LVH.   Systolic function was normal. The estimated ejection fraction was   in the range of 50% to 55%. Wall motion was normal; there were no   regional  wall motion abnormalities. Doppler parameters are   consistent with abnormal left ventricular relaxation (grade 1   diastolic dysfunction). - Aortic valve: Mildly to moderately calcified annulus. Probably   trileaflet; moderately calcified leaflets. There was mild   regurgitation. Mean gradient (S): 20 mm Hg. Peak gradient (S): 33   mm Hg. VTI ratio of LVOT to aortic valve: 0.19. Possible severe,   low-gradient aortic stenosis. Valve area (VTI): 0.53 cm^2. Valve   area (Vmax): 0.62 cm^2. Valve area (Vmean): 0.54 cm^2. - Mitral valve: Mildly calcified leaflets. There was mild   regurgitation. -  Left atrium: The atrium was at the upper limits of normal in   size. - Right atrium: Central venous pressure (est): 3 mm Hg. - Atrial septum: No defect or patent foramen ovale was identified. - Tricuspid valve: There was mild regurgitation. - Pulmonary arteries: PA peak pressure: 15 mm Hg (S). - Pericardium, extracardiac: There was no pericardial effusion.  ------------------------------------------------------------------- Labs, prior tests, procedures, and surgery: Echocardiography (February 2019).    The aortic valve showed moderate to severe stenosis.  EF was 60%. Aortic valve: peak gradient of 43 mm Hg.  ------------------------------------------------------------------- Study data:  The previous study was not available, so comparison was made to the report of February 2019.  Study status:  Routine. Procedure:  The patient reported no pain pre or post test. Transthoracic echocardiography. Image quality was adequate.  Study completion:  There were no complications.          Transthoracic echocardiography.  M-mode, complete 2D, spectral Doppler, and color Doppler.  Birthdate:  Patient birthdate: 02-11-1946.  Age:  Patient is 72 yr old.  Sex:  Gender: female.    BMI: 28.9 kg/m^2.  Blood pressure:     132/72  Patient status:  Outpatient.  Study date: Study date: 07/13/2018. Study time:  01:34 PM.  -------------------------------------------------------------------  ------------------------------------------------------------------- Left ventricle:  The cavity size was normal. Wall thickness was increased increased in a pattern of mild to moderate LVH. Systolic function was normal. The estimated ejection fraction was in the range of 50% to 55%. Wall motion was normal; there were no regional wall motion abnormalities. Doppler parameters are consistent with abnormal left ventricular relaxation (grade 1 diastolic dysfunction).  ------------------------------------------------------------------- Aortic valve:   Mildly to moderately calcified annulus. Probably trileaflet; moderately calcified leaflets. Possible severe, low-gradient aortic stenosis.  Doppler:  There was mild regurgitation.    VTI ratio of LVOT to aortic valve: 0.19. Valve area (VTI): 0.53 cm^2. Indexed valve area (VTI): 0.33 cm^2/m^2. Peak velocity ratio of LVOT to aortic valve: 0.22. Valve area (Vmax): 0.62 cm^2. Indexed valve area (Vmax): 0.39 cm^2/m^2. Mean velocity ratio of LVOT to aortic valve: 0.19. Valve area (Vmean): 0.54 cm^2. Indexed valve area (Vmean): 0.34 cm^2/m^2.    Mean gradient (S): 20 mm Hg. Peak gradient (S): 33 mm Hg.  ------------------------------------------------------------------- Aorta:  Aortic root: The aortic root was normal in size.  ------------------------------------------------------------------- Mitral valve:  Mildly calcified leaflets.  Doppler:  There was mild regurgitation.    Peak gradient (D): 3 mm Hg.  ------------------------------------------------------------------- Left atrium:  The atrium was at the upper limits of normal in size.   ------------------------------------------------------------------- Atrial septum:  No defect or patent foramen ovale was identified.   ------------------------------------------------------------------- Right ventricle:   The cavity size was normal. Systolic function was normal.  ------------------------------------------------------------------- Pulmonic valve:    The valve appears to be grossly normal. Doppler:  There was trivial regurgitation.  ------------------------------------------------------------------- Tricuspid valve:   The valve appears to be grossly normal. Doppler:  There was mild regurgitation.  ------------------------------------------------------------------- Right atrium:  The atrium was normal in size.  ------------------------------------------------------------------- Pericardium:  There was no pericardial effusion.  ------------------------------------------------------------------- Systemic veins: Inferior vena cava: The vessel was normal in size. The respirophasic diameter changes were in the normal range (>= 50%), consistent with normal central venous pressure.  ------------------------------------------------------------------- Measurements   Left ventricle                           Value  Reference  LV ID, ED, PLAX chordal                  48.2  mm       43 - 52  LV ID, ES, PLAX chordal                  35.2  mm       23 - 38  LV fx shortening, PLAX chordal   (L)     27    %        >=29  LV PW thickness, ED                      14.7  mm       ----------  IVS/LV PW ratio, ED                      0.76           <=1.3  Stroke volume, 2D                        38    ml       ----------  Stroke volume/bsa, 2D                    24    ml/m^2   ----------  LV ejection fraction, 1-p A4C            57    %        ----------  LV end-diastolic volume, 2-p             60    ml       ----------  LV end-systolic volume, 2-p              29    ml       ----------  LV ejection fraction, 2-p                52    %        ----------  Stroke volume, 2-p                       31    ml       ----------  LV end-diastolic volume/bsa, 2-p         38    ml/m^2   ----------   LV end-systolic volume/bsa, 2-p          18    ml/m^2   ----------  Stroke volume/bsa, 2-p                   19.4  ml/m^2   ----------  LV e&', lateral                           4.18  cm/s     ----------  LV E/e&', lateral                         20.07          ----------  LV e&', medial                            2.23  cm/s     ----------  LV E/e&', medial  37.62          ----------  LV e&', average                           3.21  cm/s     ----------  LV E/e&', average                         26.18          ----------    Ventricular septum                       Value          Reference  IVS thickness, ED                        11.1  mm       ----------    LVOT                                     Value          Reference  LVOT ID, S                               19    mm       ----------  LVOT area                                2.84  cm^2     ----------  LVOT peak velocity, S                    61.9  cm/s     ----------  LVOT mean velocity, S                    40.8  cm/s     ----------  LVOT VTI, S                              12    cm       ----------    Aortic valve                             Value          Reference  Aortic valve peak velocity, S            285   cm/s     ----------  Aortic valve mean velocity, S            213   cm/s     ----------  Aortic valve VTI, S                      64.8  cm       ----------  Aortic mean gradient, S                  20    mm Hg    ----------  Aortic peak gradient, S                  33  mm Hg    ----------  VTI ratio, LVOT/AV                       0.19           ----------  Aortic valve area, VTI                   0.53  cm^2     ----------  Aortic valve area/bsa, VTI               0.33  cm^2/m^2 ----------  Velocity ratio, peak, LVOT/AV            0.22           ----------  Aortic valve area, peak velocity         0.62  cm^2     ----------  Aortic valve area/bsa, peak              0.39  cm^2/m^2 ----------   velocity  Velocity ratio, mean, LVOT/AV            0.19           ----------  Aortic valve area, mean velocity         0.54  cm^2     ----------  Aortic valve area/bsa, mean              0.34  cm^2/m^2 ----------  velocity  Aortic regurg pressure half-time         573   ms       ----------    Aorta                                    Value          Reference  Aortic root ID, ED                       32    mm       ----------    Left atrium                              Value          Reference  LA ID, A-P, ES                           31    mm       ----------  LA ID/bsa, A-P                           1.96  cm/m^2   <=2.2  LA volume, S                             42    ml       ----------  LA volume/bsa, S                         26.5  ml/m^2   ----------  LA volume, ES, 1-p A4C                   43.2  ml       ----------  LA volume/bsa, ES,  1-p A4C               27.2  ml/m^2   ----------  LA volume, ES, 1-p A2C                   38.2  ml       ----------  LA volume/bsa, ES, 1-p A2C               24.1  ml/m^2   ----------    Mitral valve                             Value          Reference  Mitral E-wave peak velocity              83.9  cm/s     ----------  Mitral A-wave peak velocity              137   cm/s     ----------  Mitral deceleration time         (H)     330   ms       150 - 230  Mitral peak gradient, D                  3     mm Hg    ----------  Mitral E/A ratio, peak                   0.6            ----------  Mitral maximal regurg velocity,          519   cm/s     ----------  PISA    Pulmonary arteries                       Value          Reference  PA pressure, S, DP                       15    mm Hg    <=30    Tricuspid valve                          Value          Reference  Tricuspid regurg peak velocity           172   cm/s     ----------  Tricuspid peak RV-RA gradient            12    mm Hg    ----------    Right atrium                             Value           Reference  RA ID, S-I, ES, A4C                      40.3  mm       34 - 49  RA area, ES, A4C                 (L)     8.07  cm^2     8.3 - 19.5  RA volume, ES, A/L  12.9  ml       ----------  RA volume/bsa, ES, A/L                   8.1   ml/m^2   ----------    Systemic veins                           Value          Reference  Estimated CVP                            3     mm Hg    ----------    Right ventricle                          Value          Reference  TAPSE                                    18.2  mm       ----------  RV pressure, S, DP                       15    mm Hg    <=30  RV s&', lateral, S                        10.4  cm/s     ----------  Legend: (L)  and  (H)  mark values outside specified reference range.  ------------------------------------------------------------------- Prepared and Electronically Authenticated by  Rozann Lesches, M.D. 2019-07-31T15:28:25   RIGHT/LEFT HEART CATH AND CORONARY ANGIOGRAPHY  Conclusion     Prox RCA lesion is 30% stenosed.  Dist RCA lesion is 30% stenosed.  Ost RPDA to RPDA lesion is 70% stenosed.  Ost Cx to Prox Cx lesion is 20% stenosed.  Ost 2nd Mrg to 2nd Mrg lesion is 20% stenosed.  Prox LAD to Mid LAD lesion is 30% stenosed.  Ost 1st Diag to 1st Diag lesion is 40% stenosed.  Ost Ramus to Ramus lesion is 40% stenosed.   1. Mild to moderate non-obstructive CAD 2. Aortic valve stenosis (peak to peak gradient 16 mmHg, mean 19.5 mmHg, AVA 1.09 cm2). Severe by echo criteria (AVA 0.53 cm2,DVI 0.19. Stroke volume/bsa 19.4). She is felt to have paradoxical low flow/low gradient AS.   Recommendations: Will continue workup for TAVR. She can probably be discharged home this weekend. Will discuss further testing here in the hospital with our TAVR team. Her CT scans will need to be staged based on her renal function.     Indications   Severe aortic stenosis [I35.0 (ICD-10-CM)]  Procedural  Details/Technique   Technical Details Indication: 72 yo female with history of severe AS, hypertension, HLD, hypothyroidism, diabetes, chronic bronchitis, rheumatoid arthritis. Admitted with hypertensive crisis.   Procedure: The risks, benefits, complications, treatment options, and expected outcomes were discussed with the patient. The patient and/or family concurred with the proposed plan, giving informed consent. The patient was brought to the cath lab after IV hydration was given. The patient was sedated with Versed and Fentanyl. The IV catheter in the left antecubital vein was changed for a 5 French sheath. Right heart cath performed with a balloon tipped catheter. The right  wrist was prepped and draped in a sterile fashion. 1% lidocaine was used for local anesthesia. Using the modified Seldinger access technique, a 5 French sheath was placed in the right radial artery. 3 mg Verapamil was given through the sheath. 3000 units IV heparin was given. Standard diagnostic catheters were used to perform selective coronary angiography. I crossed her aortic valve with a JR4 catheter. The sheath was removed from the right radial artery and a Terumo hemostasis band was applied at the arteriotomy site on the right wrist.     Estimated blood loss <50 mL.  During this procedure the patient was administered the following to achieve and maintain moderate conscious sedation: Versed 2 mg, Fentanyl 50 mcg, while the patient's heart rate, blood pressure, and oxygen saturation were continuously monitored. The period of conscious sedation was 31 minutes, of which I was present face-to-face 100% of this time.  Complications   Complications documented before study signed (08/11/2018 6:54 PM EDT)    RIGHT/LEFT HEART CATH AND CORONARY ANGIOGRAPHY   None Documented by Burnell Blanks, MD 08/11/2018 6:51 PM EDT  Time Range: Intraprocedure      Coronary Findings   Diagnostic  Dominance: Right  Left Anterior  Descending  Vessel is large.  Prox LAD to Mid LAD lesion 30% stenosed  Prox LAD to Mid LAD lesion is 30% stenosed.  First Diagonal Branch  Vessel is moderate in size.  Ost 1st Diag to 1st Diag lesion 40% stenosed  Ost 1st Diag to 1st Diag lesion is 40% stenosed.  Ramus Intermedius  Vessel is moderate in size.  Ost Ramus to Ramus lesion 40% stenosed  Ost Ramus to Ramus lesion is 40% stenosed.  Left Circumflex  Ost Cx to Prox Cx lesion 20% stenosed  Ost Cx to Prox Cx lesion is 20% stenosed.  Second Obtuse Marginal Branch  Ost 2nd Mrg to 2nd Mrg lesion 20% stenosed  Ost 2nd Mrg to 2nd Mrg lesion is 20% stenosed.  Right Coronary Artery  Vessel is large.  Prox RCA lesion 30% stenosed  Prox RCA lesion is 30% stenosed.  Dist RCA lesion 30% stenosed  Dist RCA lesion is 30% stenosed.  Right Posterior Descending Artery  Ost RPDA to RPDA lesion 70% stenosed  Ost RPDA to RPDA lesion is 70% stenosed.  Inferior Septal  Vessel is small in size.  Intervention   No interventions have been documented.  Coronary Diagrams   Diagnostic Diagram       Implants    No implant documentation for this case.  MERGE Images   Show images for CARDIAC CATHETERIZATION   Link to Procedure Log   Procedure Log    Hemo Data    Most Recent Value  Fick Cardiac Output 5.36 L/min  Fick Cardiac Output Index 3.48 (L/min)/BSA  Aortic Mean Gradient 19.5 mmHg  Aortic Peak Gradient 16 mmHg  Aortic Valve Area 1.09  Aortic Value Area Index 0.71 cm2/BSA  RA A Wave 11 mmHg  RA V Wave 11 mmHg  RA Mean 8 mmHg  RV Systolic Pressure 40 mmHg  RV Diastolic Pressure 9 mmHg  RV EDP 9 mmHg  PA Systolic Pressure 48 mmHg  PA Diastolic Pressure 5 mmHg  PA Mean 23 mmHg  PW A Wave 24 mmHg  PW V Wave 26 mmHg  PW Mean 23 mmHg  AO Systolic Pressure 161 mmHg  AO Diastolic Pressure 65 mmHg  AO Mean 96 mmHg  LV Systolic Pressure 096 mmHg  LV Diastolic Pressure 15 mmHg  LV EDP 26 mmHg  AOp Systolic Pressure 160  mmHg  AOp Diastolic Pressure 65 mmHg  AOp Mean Pressure 95 mmHg  LVp Systolic Pressure 737 mmHg  LVp Diastolic Pressure 10 mmHg  LVp EDP Pressure 17 mmHg  QP/QS 1  TPVR Index 6.61 HRUI  TSVR Index 27.59 HRUI  TPVR/TSVR Ratio 0.24    Cardiac TAVR CT  TECHNIQUE: The patient was scanned on a Philips 256 scanner. A 120 kV retrospective scan was triggered in the descending thoracic aorta at 111 HU's. Gantry rotation speed was 270 msecs and collimation was .9 mm. No beta blockade or nitro were given. The 3D data set was reconstructed in 5% intervals of the R-R cycle. Systolic and diastolic phases were analyzed on a dedicated work station using MPR, MIP and VRT modes. The patient received 80 cc of contrast.  FINDINGS: Aortic Valve: Tri leaflet and calcified with restricted leaflet motion  Aorta: Moderate to severe calcific atherosclerosis with bovine arch  Sinotubular Junction: 24 mm  Ascending Thoracic Aorta: 30 mm  Aortic Arch: 22 mm  Descending Thoracic Aorta: 21 mm  Sinus of Valsalva Measurements:  Non-coronary: 30.7 mm  Right -coronary: 29.6 mm  Left -coronary: 31 mm  Coronary Artery Height above Annulus:  Left Main: 12.7 mm above annulus  Right Coronary: 17.7 mm above annulus  Virtual Basal Annulus Measurements:  Maximum/Minimum Diameter: 26.9 mm x 20.6 mm  Perimeter: 76 mm  Area: 429 mm2  Coronary Arteries: Sufficient height above annulus for deployment  Optimum Fluoroscopic Angle for Delivery: LAO 3 Caudal 3 degrees  IMPRESSION: 1. Calcified tri leaflet AV with annular area 429 mm2 Given size of sinuses best suited for a 26 mm Sapien 3 valve  2.  Optimum angiographic angle for deployment LAO 3 Caudal 3 degrees  3.  Coronary arteries sufficient height above annulus for deployment  4.  No LAA thrombus  Jenkins Rouge   Electronically Signed   By: Jenkins Rouge M.D.   On: 08/18/2018 17:22   CT ANGIOGRAPHY CHEST,  ABDOMEN AND PELVIS  TECHNIQUE: Multidetector CT imaging through the chest, abdomen and pelvis was performed using the standard protocol during bolus administration of intravenous contrast. Multiplanar reconstructed images and MIPs were obtained and reviewed to evaluate the vascular anatomy.  CONTRAST:  92mL ISOVUE-370 IOPAMIDOL (ISOVUE-370) INJECTION 76%  COMPARISON:  Chest CT 04/16/2018.  FINDINGS: CTA CHEST FINDINGS  Cardiovascular: Heart size is mildly enlarged. There is no significant pericardial fluid, thickening or pericardial calcification. There is aortic atherosclerosis, as well as atherosclerosis of the great vessels of the mediastinum and the coronary arteries, including calcified atherosclerotic plaque in the left anterior descending and right coronary arteries.  Mediastinum/Lymph Nodes: No pathologically enlarged mediastinal or hilar lymph nodes. Esophagus is unremarkable in appearance. No axillary lymphadenopathy.  Lungs/Pleura: Small right pleural effusion lying dependently. No acute consolidative airspace disease. No pleural effusions. No suspicious appearing pulmonary nodules or masses.  Musculoskeletal/Soft Tissues: There are no aggressive appearing lytic or blastic lesions noted in the visualized portions of the skeleton.  CTA ABDOMEN AND PELVIS FINDINGS  Hepatobiliary: No suspicious cystic or solid hepatic lesions. No intra or extrahepatic biliary ductal dilatation. Status post cholecystectomy.  Pancreas: No pancreatic mass. No pancreatic ductal dilatation. No pancreatic or peripancreatic fluid or inflammatory changes.  Spleen: Unremarkable.  Adrenals/Urinary Tract: Horseshoe kidney (normal anatomical variant) incidentally noted. No suspicious renal lesions. No hydroureteronephrosis. Urinary bladder is normal in appearance. Bilateral adrenal glands are normal in appearance.  Stomach/Bowel: Normal appearance of the  stomach. No  pathologic dilatation of small bowel or colon. A few scattered colonic diverticulae are noted, without surrounding inflammatory changes to suggest an acute diverticulitis at this time. The appendix is not confidently identified and may be surgically absent. Regardless, there are no inflammatory changes noted adjacent to the cecum to suggest the presence of an acute appendicitis at this time.  Vascular/Lymphatic: Aortic atherosclerosis with vascular findings and measurements pertinent to potential TAVR procedure, as detailed below. No aneurysm or dissection noted in the abdominal or pelvic vasculature. No lymphadenopathy noted in the abdomen or pelvis.  Reproductive: Status post hysterectomy. Ovaries are unremarkable in appearance.  Other: No significant volume of ascites.  No pneumoperitoneum.  Musculoskeletal: Status post PLIF from L3-S1 with interbody cage at L3-L4 and interbody grafts at L4-L5 and L5-S1. There are no aggressive appearing lytic or blastic lesions noted in the visualized portions of the skeleton.  VASCULAR MEASUREMENTS PERTINENT TO TAVR:  AORTA:  Minimal Aortic Diameter-11 x 12 mm  Severity of Aortic Calcification-mild  RIGHT PELVIS:  Right Common Iliac Artery -  Minimal Diameter-7.1 x 7.1 mm  Tortuosity-mild  Calcification - mild  Right External Iliac Artery -  Minimal Diameter-7.3 x 7.1 mm  Tortuosity - mild  Calcification-none  Right Common Femoral Artery -  Minimal Diameter-6.9 x 6.8 mm  Tortuosity - mild  Calcification-none  LEFT PELVIS:  Left Common Iliac Artery -  Minimal Diameter-6.9 x 6.5 mm  Tortuosity - mild  Calcification - mild  Left External Iliac Artery -  Minimal Diameter-6.6 x 6.5 mm  Tortuosity - mild  Calcification-none  Left Common Femoral Artery -  Minimal Diameter-7.3 x 6.7 mm  Tortuosity - mild  Calcification-none  Review of the MIP images confirms the above  findings.  IMPRESSION: 1. Vascular findings and measurements pertinent to potential TAVR procedure, as detailed above. 2. Severe thickening calcification of the aortic valve, compatible with the reported clinical history of severe aortic stenosis. 3. Small right pleural effusion lying dependently. 4. Mild cardiomegaly. 5. Aortic atherosclerosis, in addition to 2 vessel coronary artery disease. Assessment for potential risk factor modification, dietary therapy or pharmacologic therapy may be warranted, if clinically indicated. 6. Mild colonic diverticulosis without evidence of acute diverticulitis at this time. 7. Horseshoe kidney (normal anatomical variant) incidentally noted. 8. Additional incidental findings, as above.   Electronically Signed   By: Vinnie Langton M.D.   On: 08/18/2018 14:43    Impression:  Patient has mixed aortic valve disease with at least moderate and possibly severe aortic stenosis and mild to moderate aortic insufficiency.  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  She has a long-standing history of symptoms of exertional shortness of breath that are likely multifactorial and related to her underlying history of chronic diastolic congestive heart failure, chronic lung disease with possible COPD, and severe physical deconditioning.  The patient underlying thyroid disease and chronic anxiety may play a role.  She has been recently hospitalized on 3 occasions over the past 2 and half months for acute exacerbations of chronic shortness of breath with resting shortness of breath and increased anxiety.  Symptoms usually seem to improve with treatment for chronic diastolic congestive heart failure.  I have personally reviewed the patient's most recent transthoracic echocardiogram, diagnostic cardiac catheterization, and CT angiograms.  The patient's aortic valve is trileaflet.  There is thickening  with some calcification and restricted leaflet mobility involving all 3 leaflets.  However, to the  3 leaflets move better than the third.  There is at least mild if not moderate aortic insufficiency.  Peak velocity across the aortic valve measured as high as 2.8 m/s corresponding to mean transvalvular gradient estimated 20 mmHg.  The DVI was quite low and aortic valve area calculated only 0.6 cm.  Diagnostic cardiac catheterization revealed mild nonobstructive coronary artery disease.  Transvalvular gradients were similar at catheterization and echocardiography.  Patient has at least moderate aortic stenosis with mild to moderate aortic insufficiency.  She may have paradoxical low flow, normal ejection fraction severe aortic stenosis.  She might benefit from aortic valve replacement.  I feel that risks associated with conventional surgery would be relatively high and I would be very reluctant to to consider this patient a candidate for conventional surgical aortic valve replacement.  Cardiac-gated CTA of the heart reveals anatomical characteristics consistent with aortic stenosis suitable for treatment by transcatheter aortic valve replacement without any significant complicating features and CTA of the aorta and iliac vessels demonstrate what appears to be adequate pelvic vascular access to facilitate a transfemoral approach.    Plan:  The patient and one of her daughters were counseled at length regarding treatment alternatives for management of severe symptomatic aortic stenosis. Alternative approaches such as conventional aortic valve replacement, transcatheter aortic valve replacement, and continued medical therapy without intervention were compared and contrasted at length.  The risks associated with conventional surgical aortic valve replacement were discussed in detail, as were expectations for post-operative convalescence, and why I would be reluctant to consider this patient a candidate for  conventional surgery.  Issues specific to transcatheter aortic valve replacement were discussed including questions about long term valve durability, the potential for paravalvular leak, possible increased risk of need for permanent pacemaker placement, and other technical complications related to the procedure itself.  Long-term prognosis with medical therapy was discussed.  Questions regarding how much benefit and symptomatic improvement the patient might enjoy following uncomplicated aortic valve replacement were discussed.  This discussion was placed in the context of the patient's own specific clinical presentation and past medical history.  All of their questions have been addressed.    As a next step the patient will be referred for pulmonary function testing to include diffusion capacity and spirometry both pre-and post bronchodilator therapy.  Arterial blood gas will be performed.  Formal physical therapy evaluation with frailty assessment will be obtained.  The patient's numerous diagnostic tests will be reviewed by a multidisciplinary team of specialists.  The role of possible stress echocardiography to be discussed.  Once this is been obtained the patient will be seen in follow-up to further discuss the possible role of transcatheter aortic valve replacement.    I spent in excess of 90 minutes during the conduct of this office consultation and >50% of this time involved direct face-to-face encounter with the patient for counseling and/or coordination of their care.    Valentina Gu. Roxy Manns, MD 10/27/2018 12:59 PM

## 2018-10-27 NOTE — Patient Instructions (Signed)
Continue all previous medications without any changes at this time  

## 2018-10-28 NOTE — Progress Notes (Signed)
This encounter was created in error - please disregard.

## 2018-11-01 ENCOUNTER — Other Ambulatory Visit: Payer: Self-pay

## 2018-11-01 DIAGNOSIS — I350 Nonrheumatic aortic (valve) stenosis: Secondary | ICD-10-CM

## 2018-11-02 ENCOUNTER — Ambulatory Visit (HOSPITAL_COMMUNITY)
Admission: RE | Admit: 2018-11-02 | Discharge: 2018-11-02 | Disposition: A | Discharge status: Home / Self Care | Payer: Medicare HMO | Source: Ambulatory Visit | Attending: Thoracic Surgery (Cardiothoracic Vascular Surgery) | Admitting: Thoracic Surgery (Cardiothoracic Vascular Surgery)

## 2018-11-02 DIAGNOSIS — I350 Nonrheumatic aortic (valve) stenosis: Secondary | ICD-10-CM

## 2018-11-02 DIAGNOSIS — R0602 Shortness of breath: Secondary | ICD-10-CM

## 2018-11-02 LAB — PULMONARY FUNCTION TEST
DL/VA % PRED: 111 %
DL/VA: 4.12 ml/min/mmHg/L
DLCO COR % PRED: 72 %
DLCO cor: 10.68 ml/min/mmHg
DLCO unc % pred: 69 %
DLCO unc: 10.34 ml/min/mmHg
FEF 25-75 PRE: 1.29 L/s
FEF 25-75 Post: 0.7 L/sec
FEF2575-%CHANGE-POST: -45 %
FEF2575-%PRED-POST: 50 %
FEF2575-%Pred-Pre: 93 %
FEV1-%Change-Post: -13 %
FEV1-%PRED-PRE: 64 %
FEV1-%Pred-Post: 55 %
FEV1-Post: 0.86 L
FEV1-Pre: 1 L
FEV1FVC-%Change-Post: 0 %
FEV1FVC-%PRED-PRE: 117 %
FEV6-%CHANGE-POST: -13 %
FEV6-%PRED-POST: 49 %
FEV6-%Pred-Pre: 57 %
FEV6-Post: 0.97 L
FEV6-Pre: 1.13 L
FEV6FVC-%PRED-POST: 105 %
FEV6FVC-%Pred-Pre: 105 %
FVC-%Change-Post: -13 %
FVC-%PRED-POST: 46 %
FVC-%Pred-Pre: 54 %
FVC-Post: 0.97 L
FVC-Pre: 1.13 L
POST FEV1/FVC RATIO: 89 %
PRE FEV1/FVC RATIO: 89 %
PRE FEV6/FVC RATIO: 100 %
Post FEV6/FVC ratio: 100 %
RV % pred: 74 %
RV: 1.41 L
TLC % pred: 59 %
TLC: 2.37 L

## 2018-11-02 LAB — BLOOD GAS, ARTERIAL
Acid-Base Excess: 4.2 mmol/L — ABNORMAL HIGH (ref 0.0–2.0)
Bicarbonate: 27.6 mmol/L (ref 20.0–28.0)
DRAWN BY: 244901
FIO2: 21
O2 SAT: 95.2 %
PATIENT TEMPERATURE: 98.6
pCO2 arterial: 37.2 mmHg (ref 32.0–48.0)
pH, Arterial: 7.484 — ABNORMAL HIGH (ref 7.350–7.450)
pO2, Arterial: 74.6 mmHg — ABNORMAL LOW (ref 83.0–108.0)

## 2018-11-02 MED ORDER — ALBUTEROL SULFATE (2.5 MG/3ML) 0.083% IN NEBU
2.5000 mg | INHALATION_SOLUTION | Freq: Once | RESPIRATORY_TRACT | Status: AC
  Administered 2018-11-02: 2.5 mg via RESPIRATORY_TRACT

## 2018-11-02 NOTE — Progress Notes (Signed)
  [facility]   MULTIDISCIPLINARY HEART VALVE TEAM   Pt's case was discussed during Multidisciplinary Heart Valve Team meeting on 11/01/2018.  Echo and Cardiac CT images reviewed and pt felt to have Moderate Aortic Stenosis.  Symptoms felt to be related to multiple comorbidities. The pt will undergo Full PFT with ABG on Room Air on 11/02/2018. The team felt that the pt should have a repeat Echocardiogram and OV with Nell Range PA-C in January for continued follow-up of Aortic Stenosis.  I have arranged these appointments on 12/21/2018 and the pt's daughter [name] is aware.

## 2018-11-16 ENCOUNTER — Encounter: Payer: Medicare HMO | Admitting: Surgery

## 2018-12-14 HISTORY — PX: TRANSCATHETER AORTIC VALVE REPLACEMENT, TRANSFEMORAL: SHX6400

## 2018-12-20 NOTE — Progress Notes (Addendum)
[facility]                                       Cardiology Office Note    Date:  12/21/2018   ID:  OLIVER NEUWIRTH, DOB 1946-04-15, MRN [id number]  PCP:  Redmond School, MD  Cardiologist:  Dr. Jacinta Shoe    CC: repeat echo and follow up of AS  History of Present Illness:  [name] [name] is a 73 y.o. female with a history of chronic diastolic CHF, chronic lung disease with possible COPD, anxiety, DMT2, uncontrolled hypothyroidism, chronic back pain on narcotics, HTN, HLD, RA, anemia and mod to severe AS who presents to clinic for follow up.   Echocardiogram on 07/13/2018 demonstrated normal LV systolic function and normal regional wall motion, LVEF 50 to 55%, mild to moderate LVH, grade 1 diastolic dysfunction and possibly severe, low gradient aortic stenosis(mean gradient 20 mmHg), valve area 0.53 cm by VTI, mild aortic regurgitation, mild mitral regurgitation, and mild tricuspid regurgitationwith normal pulmonary pressures.Despite her chronic lung disease, she had complaints of progressivedyspneaand "passing out" episodes and was thereforereferred to Dr. Angelena Form for possibleTAVRwork-up. She was then seen by Dr. Angelena Form on 08/04/2017 with plans to proceed with TAVR work up.   She was then readmitted 8/27-08/18/18 for AECOPD and A/C CHF. She was notably hypertensive during admission and she developed acute kidney injury. Antihypertensive medications were adjusted. She underwent a R/LHC which revealed mild to moderate non-obstructive CAD and aoric valve stenosis with peak gradient 16 mmHg, mean 19.5 mmHg, AVA 1.09 cm2. She was felt to possibly have paradoxical low flow/low gradient AS. Patient was seen by Dr. Enrique Sack inpatient and underwent multiple extractions with alveoloplasty and gross debridement of remaining dentition on 08/17/18. Her TSH was noted to 116 and she was restarted on Synthroid. Plan was to continue to follow her and  let her get her diabetes and thyroid under control before pursuing valve surgery.   She was readmitted to [facility] in 10/2018 for A/C CHF and AECOPD. She was seen by Dr. Roxy Manns on 10/27/18 for surgical consultation. He felt that she may have pardoxical low flow low gradient aortic stenosis vs moderate aortic stenosis with lung disease and anxiety. Plan was to discuss with the multidisciplinary valve team who recommended repeat echo and follow up.  Today she presents to clinic for follow up. She missed the echo scheduled before this because she was confused on where it was. Overall, She has been feeling well. She says that her breathing has been much better since her last admission and she hasn't had any further issues. No CP or SOB. No LE edema, orthopnea or PND. No dizziness or syncope. No blood in stool or urine. No palpitations. She has been taking her diabetes meds and synthroid as prescribed.    Past Medical History:  Diagnosis Date  . Anemia   . Anxiety   . Aortic stenosis   . Arthritis   . Blood transfusion without reported diagnosis   . Bronchitis   . CHF (congestive heart failure) ([place])   . CKD (chronic kidney disease) stage 3, GFR 30-59 ml/min (HCC) 08/18/2018  . Depression   . Diabetes mellitus   . Diverticulitis   . Dyspnea   . Glaucoma   . Heart murmur   . Hyperlipidemia   . Hypertension   . Hypothyroidism   .  Macular degeneration   . Melanoma ([place])    left thigh, 2010  . Osteoarthritis   . Peptic ulcer   . Pre-diabetes   . Rheumatoid arthritis(714.0)   . Thyroid disease     Past Surgical History:  Procedure Laterality Date  . ABDOMINAL HYSTERECTOMY  1988  . APPENDECTOMY    . BACK SURGERY  07/2008   fusion  . BACK SURGERY  07/1447   complicated by post-op anemia requiring transfusion  . BREAST SURGERY     right breast biopsy  . CATARACT EXTRACTION    . CHOLECYSTECTOMY  1974  . COLONOSCOPY  06/06/2007   Proximal diminutive rectal polyps cold  biopsied/removed, otherwise normal rectum/ Left-sided diverticula, the remainder of the colonic mucosa appeared normal. Path-tubular adnemoa.   . COLONOSCOPY  09/14/2012   JEH:UDJSHF polyps-removed as described  above Colonic diverticulosis. Poor prep. next TCS five years.   . COLONOSCOPY N/A 10/29/2016   Procedure: COLONOSCOPY;  Surgeon: Daneil Dolin, MD;  [facility];  Service: Endoscopy;  Laterality: N/A;  2:00 pm  . ESOPHAGOGASTRODUODENOSCOPY  09/2012   Rourk: gastric ulcer, gastric duodenal erosions, small hh. benign bx  . ESOPHAGOGASTRODUODENOSCOPY N/A 10/29/2016   Procedure: ESOPHAGOGASTRODUODENOSCOPY (EGD);  Surgeon: Daneil Dolin, MD;  [facility];  Service: Endoscopy;  Laterality: N/A;  . EYE SURGERY    . FOOT SURGERY  1994   left   . LUMBAR SPINE SURGERY  08/04/2014   dr Joya Salm  . macular degen    . MALONEY DILATION N/A 10/29/2016   Procedure: Venia Minks DILATION;  Surgeon: Daneil Dolin, MD;  [facility];  Service: Endoscopy;  Laterality: N/A;  . MELANOMA EXCISION  2010  . MULTIPLE EXTRACTIONS WITH ALVEOLOPLASTY N/A 08/17/2018   Procedure: Extraction of tooth #'s 7,8, and 9 with alveoloplasty and gross debridement of remaining teeth;  Surgeon: Lenn Cal, DDS;  Location: [facility];  Service: Oral Surgery;  Laterality: N/A;  . RIGHT/LEFT HEART CATH AND CORONARY ANGIOGRAPHY N/A 08/11/2018   Procedure: RIGHT/LEFT HEART CATH AND CORONARY ANGIOGRAPHY;  Surgeon: Burnell Blanks, MD;  Location: [facility];  Service: Cardiovascular;  Laterality: N/A;  . YAG LASER APPLICATION     OS    Current Medications: Outpatient Medications Prior to Visit  Medication Sig Dispense Refill  . acetaminophen (TYLENOL 8 HOUR ARTHRITIS PAIN) 650 MG CR tablet Take 1,300 mg by mouth every 8 (eight) hours as needed for pain.    Marland Kitchen ALPRAZolam (XANAX) 0.5 MG tablet Take 0.5 mg by mouth at bedtime as needed for sleep.     Marland Kitchen amLODipine (NORVASC) 10 MG  tablet Take 1 tablet (10 mg total) by mouth at bedtime. 90 tablet 3  . aspirin EC 81 MG tablet Take 81 mg by mouth daily.     . beta carotene w/minerals (OCUVITE) tablet Take 1 tablet by mouth daily.    . Cholecalciferol ([employer] VIT D 5000 HIGH-POTENCY) 5000 UNITS capsule Take 5,000 Units by mouth daily.    . cimetidine (TAGAMET) 200 MG tablet Take 200 mg by mouth daily as needed (acid reflux/indigestion.).     Marland Kitchen Cinnamon 500 MG capsule Take 500 mg by mouth daily.    . ferrous sulfate 325 (65 FE) MG tablet Take 325 mg by mouth daily with breakfast.    . fluticasone (FLONASE) 50 MCG/ACT nasal spray Place into both nostrils daily as needed for allergies or rhinitis.    Marland Kitchen gabapentin (NEURONTIN) 300 MG capsule Take  600 mg by mouth 3 (three) times daily.     Marland Kitchen glipiZIDE (GLUCOTROL) 10 MG tablet Take 10 mg by mouth daily.     Marland Kitchen HYDROcodone-acetaminophen (NORCO) 10-325 MG tablet Take 1 tablet by mouth every 8 (eight) hours as needed for moderate pain.     Marland Kitchen latanoprost (XALATAN) 0.005 % ophthalmic solution Place 1 drop into both eyes at bedtime.     Marland Kitchen levothyroxine (SYNTHROID, LEVOTHROID) 112 MCG tablet Take 112 mcg by mouth daily before breakfast.    . metFORMIN (GLUCOPHAGE) 1000 MG tablet Take 0.5 tablets (500 mg total) by mouth 2 (two) times daily with a meal. 45 tablet 3  . Omega-3 Fatty Acids (FISH OIL) 1000 MG CAPS Take 1,000 mg by mouth daily.     . potassium chloride SA (K-DUR,KLOR-CON) 20 MEQ tablet Take 20 mEq by mouth daily.     . Prenatal Vit-Fe Fumarate-FA (PRENATAL MULTIVITAMIN) TABS Take 1 tablet by mouth daily.    . simvastatin (ZOCOR) 20 MG tablet Take 20 mg by mouth at bedtime.      . timolol (TIMOPTIC) 0.5 % ophthalmic solution Place 1 drop into both eyes 2 (two) times daily.     . VENTOLIN HFA 108 (90 Base) MCG/ACT inhaler Inhale 2 puffs into the lungs every 4 (four) hours as needed for wheezing or shortness of breath.   0  . vitamin C (ASCORBIC ACID) 500 MG tablet Take 500 mg by mouth  daily.    . vitamin E 400 UNIT capsule Take 400 Units by mouth daily.    . chlorhexidine (PERIDEX) 0.12 % solution Use as directed 15 mLs in the mouth or throat 2 (two) times daily. 120 mL 0  . predniSONE (DELTASONE) 10 MG tablet Take 1 tablet (10 mg total) by mouth daily with breakfast. 1 tablet 0   No facility-administered medications prior to visit.      Allergies:   Patient has no known allergies.   Social History   Socioeconomic History  . Marital status: Widowed    Spouse name: Not on file  . Number of children: 2  . Years of education: Not on file  . Highest education level: Not on file  Occupational History  . Not on file  Social Needs  . Financial resource strain: Somewhat hard  . Food insecurity:    Worry: Sometimes true    Inability: Sometimes true  . Transportation needs:    Medical: No    Non-medical: Yes  Tobacco Use  . Smoking status: Never Smoker  . Smokeless tobacco: Never Used  Substance and Sexual Activity  . Alcohol use: No    Comment: "Wine a couple times a month"  . Drug use: No  . Sexual activity: Yes    Birth control/protection: Surgical  Lifestyle  . Physical activity:    Days per week: 7 days    Minutes per session: 60 min  . Stress: Only a little  Relationships  . Social connections:    Talks on phone: More than three times a week    Gets together: More than three times a week    Attends religious service: Not on file    Active member of club or organization: Not on file    Attends meetings of clubs or organizations: Not on file    Relationship status: Not on file  Other Topics Concern  . Not on file  Social History Narrative   Husband deceased 2011-04-12.      Family History:  The patient's family history includes Bipolar disorder in her daughter; Breast cancer in her sister; Colon cancer in her brother; Diabetes in her brother, mother, sister, and sister; Glaucoma in her mother; Heart attack in her brother; Heart failure in her father  and mother; Hypertension in her daughter, mother, sister, sister, sister, and another family member; Other in her sister and another family member.     ROS:   Please see the history of present illness.    ROS All other systems reviewed and are negative.   PHYSICAL EXAM:   VS:  BP 122/61   Pulse 76   Ht 4\' 9"  (1.448 m)   Wt 142 lb 3.2 oz (64.5 kg)   SpO2 95%   BMI 30.77 kg/m    GEN: Well nourished, well developed, in no acute distress HEENT: normal Neck: no JVD or masses Cardiac: RRR; 3/6 harsh systolic murmur. No rubs, or gallops,no edema  Respiratory:  clear to auscultation bilaterally, normal work of breathing GI: soft, nontender, nondistended, + BS MS: no deformity or atrophy Skin: warm and dry, no rash Neuro:  Alert and Oriented x 3, Strength and sensation are intact Psych: euthymic mood, full affect   Wt Readings from Last 3 Encounters:  12/21/18 142 lb 3.2 oz (64.5 kg)  10/27/18 136 lb (61.7 kg)  09/23/18 141 lb (64 kg)      Studies/Labs Reviewed:   EKG:  EKG is NOT ordered today.    Recent Labs: 08/09/2018: ALT 16; B Natriuretic Peptide 489.1; TSH 116.220 08/12/2018: Magnesium 2.4 08/15/2018: Hemoglobin 9.7; Platelets 207 08/18/2018: BUN 34; Creatinine, Ser 1.18; Potassium 3.9; Sodium 139   Lipid Panel    Component Value Date/Time   CHOL 162 08/09/2018 2339   TRIG 323 (H) 08/09/2018 2339   HDL 36 (L) 08/09/2018 2339   CHOLHDL 4.5 08/09/2018 2339   VLDL 65 (H) 08/09/2018 2339   LDLCALC 61 08/09/2018 2339    Additional studies/ records that were reviewed today include:   Echo 07/13/18 Study Conclusions - Left ventricle: The cavity size was normal. Wall thickness was   increased increased in a pattern of mild to moderate LVH.   Systolic function was normal. The estimated ejection fraction was   in the range of 50% to 55%. Wall motion was normal; there were no   regional wall motion abnormalities. Doppler parameters are   consistent with abnormal left  ventricular relaxation (grade 1   diastolic dysfunction). - Aortic valve: Mildly to moderately calcified annulus. Probably   trileaflet; moderately calcified leaflets. There was mild   regurgitation. Mean gradient (S): 20 mm Hg. Peak gradient (S): 33   mm Hg. VTI ratio of LVOT to aortic valve: 0.19. Possible severe,   low-gradient aortic stenosis. Valve area (VTI): 0.53 cm^2. Valve   area (Vmax): 0.62 cm^2. Valve area (Vmean): 0.54 cm^2. - Mitral valve: Mildly calcified leaflets. There was mild   regurgitation. - Left atrium: The atrium was at the upper limits of normal in   size. - Right atrium: Central venous pressure (est): 3 mm Hg. - Atrial septum: No defect or patent foramen ovale was identified. - Tricuspid valve: There was mild regurgitation. - Pulmonary arteries: PA peak pressure: 15 mm Hg (S). - Pericardium, extracardiac: There was no pericardial effusion.  _____________  Right and left heart cath 08/11/18  Prox RCA lesion is 30% stenosed.  Dist RCA lesion is 30% stenosed.  Ost RPDA to RPDA lesion is 70% stenosed.  Ost Cx to Prox Cx lesion  is 20% stenosed.  Ost 2nd Mrg to 2nd Mrg lesion is 20% stenosed.  Prox LAD to Mid LAD lesion is 30% stenosed.  Ost 1st Diag to 1st Diag lesion is 40% stenosed.  Ost Ramus to Ramus lesion is 40% stenosed.  1. Mild to moderate non-obstructive CAD 2. Aortic valve stenosis (peak to peak gradient 16 mmHg, mean 19.5 mmHg, AVA 1.09 cm2). Severe by echo criteria (AVA 0.53 cm2,DVI 0.19. Stroke volume/bsa 19.4). She is felt to have paradoxical low flow/low gradient AS.     ASSESSMENT & PLAN:   Aortic stenosis: there has been some debate on whether she truly has severe AS. She was supposed to have an echo today but missed her appointment. Echo will be rescheduled for next Monday. We will discuss findings with the multidisciplinary valve team and determine her candidacy for potential TAVR. She has NYHA class II symptoms of shortness of  breath and fatigue.   Hypothyroidism: will check a TSH today  DMT2: will check HgA1c  Medication Adjustments/Labs and Tests Ordered: Current medicines are reviewed at length with the patient today.  Concerns regarding medicines are outlined above.  Medication changes, Labs and Tests ordered today are listed in the Patient Instructions below. Patient Instructions  Medication Instructions:  Your provider recommends that you continue on your current medications as directed. Please refer to the Current Medication list given to you today.    Labwork: TODAY: TSH, A1C  Testing/Procedures: You have an echo scheduled Monday, December 26, 2018. Please arrive at the [place] [facility] at 1:30PM.  Follow-Up: We will call you for further arrangements!    Signed, Angelena Form, PA-C  12/21/2018 8:54 PM    [facility] [address], [place], [place]  [address] Phone: [phone]; Fax: [phone]

## 2018-12-20 NOTE — H&P (View-Only) (Signed)
[facility]                                       Cardiology Office Note    Date:  12/21/2018   ID:  [name] [name], DOB [birth year], MRN [id number]  PCP:  Redmond School, MD  Cardiologist:  Dr. Jacinta Shoe    CC: repeat echo and follow up of AS  History of Present Illness:  [name] [name] is a 73 y.o. female with a history of chronic diastolic CHF, chronic lung disease with possible COPD, anxiety, DMT2, uncontrolled hypothyroidism, chronic back pain on narcotics, HTN, HLD, RA, anemia and mod to severe AS who presents to clinic for follow up.   Echocardiogram on 07/13/2018 demonstrated normal LV systolic function and normal regional wall motion, LVEF 50 to 55%, mild to moderate LVH, grade 1 diastolic dysfunction and possibly severe, low gradient aortic stenosis(mean gradient 20 mmHg), valve area 0.53 cm by VTI, mild aortic regurgitation, mild mitral regurgitation, and mild tricuspid regurgitationwith normal pulmonary pressures.Despite her chronic lung disease, she had complaints of progressivedyspneaand "passing out" episodes and was thereforereferred to Dr. Angelena Form for possibleTAVRwork-up. She was then seen by Dr. Angelena Form on 08/04/2017 with plans to proceed with TAVR work up.   She was then readmitted 8/27-08/18/18 for AECOPD and A/C CHF. She was notably hypertensive during admission and she developed acute kidney injury. Antihypertensive medications were adjusted. She underwent a R/LHC which revealed mild to moderate non-obstructive CAD and aoric valve stenosis with peak gradient 16 mmHg, mean 19.5 mmHg, AVA 1.09 cm2. She was felt to possibly have paradoxical low flow/low gradient AS. Patient was seen by Dr. Enrique Sack inpatient and underwent multiple extractions with alveoloplasty and gross debridement of remaining dentition on 08/17/18. Her TSH was noted to 116 and she was restarted on Synthroid. Plan was to continue to follow her and  let her get her diabetes and thyroid under control before pursuing valve surgery.   She was readmitted to [facility] in 10/2018 for A/C CHF and AECOPD. She was seen by Dr. Roxy Manns on 10/27/18 for surgical consultation. He felt that she may have pardoxical low flow low gradient aortic stenosis vs moderate aortic stenosis with lung disease and anxiety. Plan was to discuss with the multidisciplinary valve team who recommended repeat echo and follow up.  Today she presents to clinic for follow up. She missed the echo scheduled before this because she was confused on where it was. Overall, She has been feeling well. She says that her breathing has been much better since her last admission and she hasn't had any further issues. No CP or SOB. No LE edema, orthopnea or PND. No dizziness or syncope. No blood in stool or urine. No palpitations. She has been taking her diabetes meds and synthroid as prescribed.    Past Medical History:  Diagnosis Date  . Anemia   . Anxiety   . Aortic stenosis   . Arthritis   . Blood transfusion without reported diagnosis   . Bronchitis   . CHF (congestive heart failure) ([place])   . CKD (chronic kidney disease) stage 3, GFR 30-59 ml/min (HCC) 08/18/2018  . Depression   . Diabetes mellitus   . Diverticulitis   . Dyspnea   . Glaucoma   . Heart murmur   . Hyperlipidemia   . Hypertension   . Hypothyroidism   .  Macular degeneration   . Melanoma ([place])    left thigh, 2010  . Osteoarthritis   . Peptic ulcer   . Pre-diabetes   . Rheumatoid arthritis(714.0)   . Thyroid disease     Past Surgical History:  Procedure Laterality Date  . ABDOMINAL HYSTERECTOMY  1988  . APPENDECTOMY    . BACK SURGERY  07/2008   fusion  . BACK SURGERY  0/8657   complicated by post-op anemia requiring transfusion  . BREAST SURGERY     right breast biopsy  . CATARACT EXTRACTION    . CHOLECYSTECTOMY  1974  . COLONOSCOPY  06/06/2007   Proximal diminutive rectal polyps cold  biopsied/removed, otherwise normal rectum/ Left-sided diverticula, the remainder of the colonic mucosa appeared normal. Path-tubular adnemoa.   . COLONOSCOPY  09/14/2012   QIO:NGEXBM polyps-removed as described  above Colonic diverticulosis. Poor prep. next TCS five years.   . COLONOSCOPY N/A 10/29/2016   Procedure: COLONOSCOPY;  Surgeon: Daneil Dolin, MD;  [facility];  Service: Endoscopy;  Laterality: N/A;  2:00 pm  . ESOPHAGOGASTRODUODENOSCOPY  09/2012   Rourk: gastric ulcer, gastric duodenal erosions, small hh. benign bx  . ESOPHAGOGASTRODUODENOSCOPY N/A 10/29/2016   Procedure: ESOPHAGOGASTRODUODENOSCOPY (EGD);  Surgeon: Daneil Dolin, MD;  [facility];  Service: Endoscopy;  Laterality: N/A;  . EYE SURGERY    . FOOT SURGERY  1994   left   . LUMBAR SPINE SURGERY  08/04/2014   dr Joya Salm  . macular degen    . MALONEY DILATION N/A 10/29/2016   Procedure: Venia Minks DILATION;  Surgeon: Daneil Dolin, MD;  [facility];  Service: Endoscopy;  Laterality: N/A;  . MELANOMA EXCISION  2010  . MULTIPLE EXTRACTIONS WITH ALVEOLOPLASTY N/A 08/17/2018   Procedure: Extraction of tooth #'s 7,8, and 9 with alveoloplasty and gross debridement of remaining teeth;  Surgeon: Lenn Cal, DDS;  Location: [facility];  Service: Oral Surgery;  Laterality: N/A;  . RIGHT/LEFT HEART CATH AND CORONARY ANGIOGRAPHY N/A 08/11/2018   Procedure: RIGHT/LEFT HEART CATH AND CORONARY ANGIOGRAPHY;  Surgeon: Burnell Blanks, MD;  Location: [facility];  Service: Cardiovascular;  Laterality: N/A;  . YAG LASER APPLICATION     OS    Current Medications: Outpatient Medications Prior to Visit  Medication Sig Dispense Refill  . acetaminophen (TYLENOL 8 HOUR ARTHRITIS PAIN) 650 MG CR tablet Take 1,300 mg by mouth every 8 (eight) hours as needed for pain.    Marland Kitchen ALPRAZolam (XANAX) 0.5 MG tablet Take 0.5 mg by mouth at bedtime as needed for sleep.     Marland Kitchen amLODipine (NORVASC) 10 MG  tablet Take 1 tablet (10 mg total) by mouth at bedtime. 90 tablet 3  . aspirin EC 81 MG tablet Take 81 mg by mouth daily.     . beta carotene w/minerals (OCUVITE) tablet Take 1 tablet by mouth daily.    . Cholecalciferol ([employer] VIT D 5000 HIGH-POTENCY) 5000 UNITS capsule Take 5,000 Units by mouth daily.    . cimetidine (TAGAMET) 200 MG tablet Take 200 mg by mouth daily as needed (acid reflux/indigestion.).     Marland Kitchen Cinnamon 500 MG capsule Take 500 mg by mouth daily.    . ferrous sulfate 325 (65 FE) MG tablet Take 325 mg by mouth daily with breakfast.    . fluticasone (FLONASE) 50 MCG/ACT nasal spray Place into both nostrils daily as needed for allergies or rhinitis.    Marland Kitchen gabapentin (NEURONTIN) 300 MG capsule Take  600 mg by mouth 3 (three) times daily.     Marland Kitchen glipiZIDE (GLUCOTROL) 10 MG tablet Take 10 mg by mouth daily.     Marland Kitchen HYDROcodone-acetaminophen (NORCO) 10-325 MG tablet Take 1 tablet by mouth every 8 (eight) hours as needed for moderate pain.     Marland Kitchen latanoprost (XALATAN) 0.005 % ophthalmic solution Place 1 drop into both eyes at bedtime.     Marland Kitchen levothyroxine (SYNTHROID, LEVOTHROID) 112 MCG tablet Take 112 mcg by mouth daily before breakfast.    . metFORMIN (GLUCOPHAGE) 1000 MG tablet Take 0.5 tablets (500 mg total) by mouth 2 (two) times daily with a meal. 45 tablet 3  . Omega-3 Fatty Acids (FISH OIL) 1000 MG CAPS Take 1,000 mg by mouth daily.     . potassium chloride SA (K-DUR,KLOR-CON) 20 MEQ tablet Take 20 mEq by mouth daily.     . Prenatal Vit-Fe Fumarate-FA (PRENATAL MULTIVITAMIN) TABS Take 1 tablet by mouth daily.    . simvastatin (ZOCOR) 20 MG tablet Take 20 mg by mouth at bedtime.      . timolol (TIMOPTIC) 0.5 % ophthalmic solution Place 1 drop into both eyes 2 (two) times daily.     . VENTOLIN HFA 108 (90 Base) MCG/ACT inhaler Inhale 2 puffs into the lungs every 4 (four) hours as needed for wheezing or shortness of breath.   0  . vitamin C (ASCORBIC ACID) 500 MG tablet Take 500 mg by mouth  daily.    . vitamin E 400 UNIT capsule Take 400 Units by mouth daily.    . chlorhexidine (PERIDEX) 0.12 % solution Use as directed 15 mLs in the mouth or throat 2 (two) times daily. 120 mL 0  . predniSONE (DELTASONE) 10 MG tablet Take 1 tablet (10 mg total) by mouth daily with breakfast. 1 tablet 0   No facility-administered medications prior to visit.      Allergies:   Patient has no known allergies.   Social History   Socioeconomic History  . Marital status: Widowed    Spouse name: Not on file  . Number of children: 2  . Years of education: Not on file  . Highest education level: Not on file  Occupational History  . Not on file  Social Needs  . Financial resource strain: Somewhat hard  . Food insecurity:    Worry: Sometimes true    Inability: Sometimes true  . Transportation needs:    Medical: No    Non-medical: Yes  Tobacco Use  . Smoking status: Never Smoker  . Smokeless tobacco: Never Used  Substance and Sexual Activity  . Alcohol use: No    Comment: "Wine a couple times a month"  . Drug use: No  . Sexual activity: Yes    Birth control/protection: Surgical  Lifestyle  . Physical activity:    Days per week: 7 days    Minutes per session: 60 min  . Stress: Only a little  Relationships  . Social connections:    Talks on phone: More than three times a week    Gets together: More than three times a week    Attends religious service: Not on file    Active member of club or organization: Not on file    Attends meetings of clubs or organizations: Not on file    Relationship status: Not on file  Other Topics Concern  . Not on file  Social History Narrative   Husband deceased 04/25/11.      Family History:  The patient's family history includes Bipolar disorder in her daughter; Breast cancer in her sister; Colon cancer in her brother; Diabetes in her brother, mother, sister, and sister; Glaucoma in her mother; Heart attack in her brother; Heart failure in her father  and mother; Hypertension in her daughter, mother, sister, sister, sister, and another family member; Other in her sister and another family member.     ROS:   Please see the history of present illness.    ROS All other systems reviewed and are negative.   PHYSICAL EXAM:   VS:  BP 122/61   Pulse 76   Ht 4\' 9"  (1.448 m)   Wt 142 lb 3.2 oz (64.5 kg)   SpO2 95%   BMI 30.77 kg/m    GEN: Well nourished, well developed, in no acute distress HEENT: normal Neck: no JVD or masses Cardiac: RRR; 3/6 harsh systolic murmur. No rubs, or gallops,no edema  Respiratory:  clear to auscultation bilaterally, normal work of breathing GI: soft, nontender, nondistended, + BS MS: no deformity or atrophy Skin: warm and dry, no rash Neuro:  Alert and Oriented x 3, Strength and sensation are intact Psych: euthymic mood, full affect   Wt Readings from Last 3 Encounters:  12/21/18 142 lb 3.2 oz (64.5 kg)  10/27/18 136 lb (61.7 kg)  09/23/18 141 lb (64 kg)      Studies/Labs Reviewed:   EKG:  EKG is NOT ordered today.    Recent Labs: 08/09/2018: ALT 16; B Natriuretic Peptide 489.1; TSH 116.220 08/12/2018: Magnesium 2.4 08/15/2018: Hemoglobin 9.7; Platelets 207 08/18/2018: BUN 34; Creatinine, Ser 1.18; Potassium 3.9; Sodium 139   Lipid Panel    Component Value Date/Time   CHOL 162 08/09/2018 2339   TRIG 323 (H) 08/09/2018 2339   HDL 36 (L) 08/09/2018 2339   CHOLHDL 4.5 08/09/2018 2339   VLDL 65 (H) 08/09/2018 2339   LDLCALC 61 08/09/2018 2339    Additional studies/ records that were reviewed today include:   Echo 07/13/18 Study Conclusions - Left ventricle: The cavity size was normal. Wall thickness was   increased increased in a pattern of mild to moderate LVH.   Systolic function was normal. The estimated ejection fraction was   in the range of 50% to 55%. Wall motion was normal; there were no   regional wall motion abnormalities. Doppler parameters are   consistent with abnormal left  ventricular relaxation (grade 1   diastolic dysfunction). - Aortic valve: Mildly to moderately calcified annulus. Probably   trileaflet; moderately calcified leaflets. There was mild   regurgitation. Mean gradient (S): 20 mm Hg. Peak gradient (S): 33   mm Hg. VTI ratio of LVOT to aortic valve: 0.19. Possible severe,   low-gradient aortic stenosis. Valve area (VTI): 0.53 cm^2. Valve   area (Vmax): 0.62 cm^2. Valve area (Vmean): 0.54 cm^2. - Mitral valve: Mildly calcified leaflets. There was mild   regurgitation. - Left atrium: The atrium was at the upper limits of normal in   size. - Right atrium: Central venous pressure (est): 3 mm Hg. - Atrial septum: No defect or patent foramen ovale was identified. - Tricuspid valve: There was mild regurgitation. - Pulmonary arteries: PA peak pressure: 15 mm Hg (S). - Pericardium, extracardiac: There was no pericardial effusion.  _____________  Right and left heart cath 08/11/18  Prox RCA lesion is 30% stenosed.  Dist RCA lesion is 30% stenosed.  Ost RPDA to RPDA lesion is 70% stenosed.  Ost Cx to Prox Cx lesion  is 20% stenosed.  Ost 2nd Mrg to 2nd Mrg lesion is 20% stenosed.  Prox LAD to Mid LAD lesion is 30% stenosed.  Ost 1st Diag to 1st Diag lesion is 40% stenosed.  Ost Ramus to Ramus lesion is 40% stenosed.  1. Mild to moderate non-obstructive CAD 2. Aortic valve stenosis (peak to peak gradient 16 mmHg, mean 19.5 mmHg, AVA 1.09 cm2). Severe by echo criteria (AVA 0.53 cm2,DVI 0.19. Stroke volume/bsa 19.4). She is felt to have paradoxical low flow/low gradient AS.     ASSESSMENT & PLAN:   Aortic stenosis: there has been some debate on whether she truly has severe AS. She was supposed to have an echo today but missed her appointment. Echo will be rescheduled for next Monday. We will discuss findings with the multidisciplinary valve team and determine her candidacy for potential TAVR. She has NYHA class II symptoms of shortness of  breath and fatigue.   Hypothyroidism: will check a TSH today  DMT2: will check HgA1c  Medication Adjustments/Labs and Tests Ordered: Current medicines are reviewed at length with the patient today.  Concerns regarding medicines are outlined above.  Medication changes, Labs and Tests ordered today are listed in the Patient Instructions below. Patient Instructions  Medication Instructions:  Your provider recommends that you continue on your current medications as directed. Please refer to the Current Medication list given to you today.    Labwork: TODAY: TSH, A1C  Testing/Procedures: You have an echo scheduled Monday, December 26, 2018. Please arrive at the [facility] at 1:30PM.  Follow-Up: We will call you for further arrangements!    Signed, Angelena Form, PA-C  12/21/2018 8:54 PM    [facility] [place], [place], [place]  [address] Phone: [phone]; Fax: [phone]

## 2018-12-21 ENCOUNTER — Ambulatory Visit: Payer: Medicare Other | Admitting: Physician Assistant

## 2018-12-21 ENCOUNTER — Ambulatory Visit (HOSPITAL_COMMUNITY): Payer: Medicare Other

## 2018-12-21 ENCOUNTER — Encounter: Payer: Self-pay | Admitting: Physician Assistant

## 2018-12-21 VITALS — BP 122/61 | HR 76 | Ht <= 58 in | Wt 142.2 lb

## 2018-12-21 DIAGNOSIS — E039 Hypothyroidism, unspecified: Secondary | ICD-10-CM

## 2018-12-21 DIAGNOSIS — E119 Type 2 diabetes mellitus without complications: Secondary | ICD-10-CM

## 2018-12-21 DIAGNOSIS — E1129 Type 2 diabetes mellitus with other diabetic kidney complication: Secondary | ICD-10-CM | POA: Diagnosis not present

## 2018-12-21 DIAGNOSIS — I060 Rheumatic aortic stenosis: Secondary | ICD-10-CM | POA: Diagnosis not present

## 2018-12-21 NOTE — Patient Instructions (Signed)
Medication Instructions:  Your provider recommends that you continue on your current medications as directed. Please refer to the Current Medication list given to you today.    Labwork: TODAY: TSH, A1C  Testing/Procedures: You have an echo scheduled Monday, December 26, 2018. Please arrive at the [facility] at 1:30PM.  Follow-Up: We will call you for further arrangements!

## 2018-12-22 LAB — HEMOGLOBIN A1C
Est. average glucose Bld gHb Est-mCnc: 232 mg/dL
Hgb A1c MFr Bld: 9.7 % — ABNORMAL HIGH (ref 4.8–5.6)

## 2018-12-22 LAB — TSH: TSH: 31.42 u[IU]/mL — AB (ref 0.450–4.500)

## 2018-12-26 ENCOUNTER — Other Ambulatory Visit (HOSPITAL_COMMUNITY): Payer: Self-pay

## 2018-12-26 ENCOUNTER — Ambulatory Visit: Payer: Self-pay | Admitting: Cardiovascular Disease

## 2018-12-30 ENCOUNTER — Other Ambulatory Visit (HOSPITAL_COMMUNITY): Payer: Self-pay

## 2019-01-04 ENCOUNTER — Other Ambulatory Visit: Payer: Self-pay

## 2019-01-04 ENCOUNTER — Ambulatory Visit (HOSPITAL_COMMUNITY): Payer: Medicare Other | Attending: Cardiology

## 2019-01-04 ENCOUNTER — Other Ambulatory Visit (HOSPITAL_COMMUNITY): Payer: Self-pay

## 2019-01-04 DIAGNOSIS — I060 Rheumatic aortic stenosis: Secondary | ICD-10-CM | POA: Insufficient documentation

## 2019-01-06 ENCOUNTER — Other Ambulatory Visit: Payer: Self-pay | Admitting: Physician Assistant

## 2019-01-06 NOTE — Pre-Procedure Instructions (Addendum)
AVAGRACE BOTELHO  01/06/2019      [facility] [address], [place] [address] Phone: [phone] Fax: [place], [place], [place] [address] [place] [address] Phone: [phone] Fax: [phone]    Your procedure is scheduled on Tuesday, January 28th.  Report to [facility] Admitting at 8:00 A.M.  Call this number if you have problems the morning of surgery:  [phone]   Remember:  Do not eat or drink after midnight.    Take these medicines the morning of surgery with A SIP OF WATER: NONE  Continue taking Aspirin as normal up until the day of surgery. No meds are to be taken on the day of surgery.   As of today, STOP taking any Aleve, Naproxen, Ibuprofen, Motrin, Advil, Goody's, BC's, all herbal medications, fish oil, and all vitamins and supplements.   WHAT DO I DO ABOUT MY DIABETES MEDICATION?   Take last dose of metFORMIN (GLUCOPHAGE) on 01/08/2019 until instructed otherwise.   . Do not take oral diabetes medicines (pills) the morning of surgery. metFORMIN (GLUCOPHAGE) or glipiZIDE (GLUCOTROL).    How to Manage Your Diabetes Before and After Surgery  Why is it important to control my blood sugar before and after surgery? . Improving blood sugar levels before and after surgery helps healing and can limit problems. . A way of improving blood sugar control is eating a healthy diet by: o  Eating less sugar and carbohydrates o  Increasing activity/exercise o  Talking with your doctor about reaching your blood sugar goals . High blood sugars (greater than 180 mg/dL) can raise your risk of infections and slow your recovery, so you will need to focus on controlling your diabetes during the weeks before surgery. . Make sure that the doctor who takes care of your diabetes knows about your planned surgery including the date and location.  How do I manage my  blood sugar before surgery? . Check your blood sugar at least 4 times a day, starting 2 days before surgery, to make sure that the level is not too high or low. o Check your blood sugar the morning of your surgery when you wake up and every 2 hours until you get to the Short Stay unit. . If your blood sugar is less than 70 mg/dL, you will need to treat for low blood sugar: o Do not take insulin. o Treat a low blood sugar (less than 70 mg/dL) with  cup of clear juice (cranberry or apple), 4 glucose tablets, OR glucose gel. o Recheck blood sugar in 15 minutes after treatment (to make sure it is greater than 70 mg/dL). If your blood sugar is not greater than 70 mg/dL on recheck, call [phone] for further instructions. . Report your blood sugar to the short stay nurse when you get to Short Stay.  . If you are admitted to the hospital after surgery: o Your blood sugar will be checked by the staff and you will probably be given insulin after surgery (instead of oral diabetes medicines) to make sure you have good blood sugar levels. o The goal for blood sugar control after surgery is 80-180 mg/dL.     Do not wear jewelry, make-up or nail polish.  Do not wear lotions, powders, or perfumes, or deodorant.  Do not shave 48 hours prior to surgery.  Do not bring valuables to the hospital.  [facility] is not responsible for any belongings or valuables.  Contacts, dentures or bridgework may not be worn into surgery.  Leave your suitcase in the car.  After surgery it may be brought to your room.  For patients admitted to the hospital, discharge time will be determined by your treatment team.  Patients discharged the day of surgery will not be allowed to drive home.   Special instructions:   [facility]- Preparing For Surgery  Before surgery, you can play an important role. Because skin is not sterile, your skin needs to be as free of germs as possible. You can reduce the number of germs on your  skin by washing with CHG (chlorahexidine gluconate) Soap before surgery.  CHG is an antiseptic cleaner which kills germs and bonds with the skin to continue killing germs even after washing.    Oral Hygiene is also important to reduce your risk of infection.  Remember - BRUSH YOUR TEETH THE MORNING OF SURGERY WITH YOUR REGULAR TOOTHPASTE  Please do not use if you have an allergy to CHG or antibacterial soaps. If your skin becomes reddened/irritated stop using the CHG.  Do not shave (including legs and underarms) for at least 48 hours prior to first CHG shower. It is OK to shave your face.  Please follow these instructions carefully.   1. Shower the NIGHT BEFORE SURGERY and the MORNING OF SURGERY with CHG.   2. If you chose to wash your hair, wash your hair first as usual with your normal shampoo.  3. After you shampoo, rinse your hair and body thoroughly to remove the shampoo.  4. Use CHG as you would any other liquid soap. You can apply CHG directly to the skin and wash gently with a scrungie or a clean washcloth.   5. Apply the CHG Soap to your body ONLY FROM THE NECK DOWN.  Do not use on open wounds or open sores. Avoid contact with your eyes, ears, mouth and genitals (private parts). Wash Face and genitals (private parts)  with your normal soap.  6. Wash thoroughly, paying special attention to the area where your surgery will be performed.  7. Thoroughly rinse your body with warm water from the neck down.  8. DO NOT shower/wash with your normal soap after using and rinsing off the CHG Soap.  9. Pat yourself dry with a CLEAN TOWEL.  10. Wear CLEAN PAJAMAS to bed the night before surgery, wear comfortable clothes the morning of surgery  11. Place CLEAN SHEETS on your bed the night of your first shower and DO NOT SLEEP WITH PETS.    Day of Surgery: Shower as stated above Do not apply any deodorants/lotions.  Please wear clean clothes to the hospital/surgery center.   Remember to  brush your teeth WITH YOUR REGULAR TOOTHPASTE.   Please read over the following fact sheets that you were given.

## 2019-01-09 ENCOUNTER — Ambulatory Visit (HOSPITAL_COMMUNITY)
Admission: RE | Admit: 2019-01-09 | Discharge: 2019-01-09 | Disposition: A | Discharge status: Home / Self Care | Payer: Medicare Other | Source: Ambulatory Visit | Attending: Physician Assistant | Admitting: Physician Assistant

## 2019-01-09 ENCOUNTER — Encounter (HOSPITAL_COMMUNITY): Payer: Self-pay

## 2019-01-09 ENCOUNTER — Encounter (HOSPITAL_COMMUNITY)
Admission: RE | Admit: 2019-01-09 | Discharge: 2019-01-09 | Disposition: A | Discharge status: Home / Self Care | Payer: Medicare Other | Source: Ambulatory Visit | Attending: Cardiovascular Disease | Admitting: Cardiovascular Disease

## 2019-01-09 ENCOUNTER — Other Ambulatory Visit: Payer: Self-pay

## 2019-01-09 DIAGNOSIS — E039 Hypothyroidism, unspecified: Secondary | ICD-10-CM | POA: Diagnosis not present

## 2019-01-09 DIAGNOSIS — Z006 Encounter for examination for normal comparison and control in clinical research program: Secondary | ICD-10-CM | POA: Diagnosis not present

## 2019-01-09 DIAGNOSIS — H409 Unspecified glaucoma: Secondary | ICD-10-CM | POA: Diagnosis not present

## 2019-01-09 DIAGNOSIS — Z7982 Long term (current) use of aspirin: Secondary | ICD-10-CM | POA: Diagnosis not present

## 2019-01-09 DIAGNOSIS — I5032 Chronic diastolic (congestive) heart failure: Secondary | ICD-10-CM | POA: Diagnosis not present

## 2019-01-09 DIAGNOSIS — H3530 Unspecified macular degeneration: Secondary | ICD-10-CM | POA: Diagnosis not present

## 2019-01-09 DIAGNOSIS — I2510 Atherosclerotic heart disease of native coronary artery without angina pectoris: Secondary | ICD-10-CM | POA: Diagnosis not present

## 2019-01-09 DIAGNOSIS — J984 Other disorders of lung: Secondary | ICD-10-CM | POA: Diagnosis not present

## 2019-01-09 DIAGNOSIS — Z833 Family history of diabetes mellitus: Secondary | ICD-10-CM | POA: Diagnosis not present

## 2019-01-09 DIAGNOSIS — I352 Nonrheumatic aortic (valve) stenosis with insufficiency: Secondary | ICD-10-CM | POA: Diagnosis not present

## 2019-01-09 DIAGNOSIS — Z7951 Long term (current) use of inhaled steroids: Secondary | ICD-10-CM | POA: Diagnosis not present

## 2019-01-09 DIAGNOSIS — Z7984 Long term (current) use of oral hypoglycemic drugs: Secondary | ICD-10-CM | POA: Diagnosis not present

## 2019-01-09 DIAGNOSIS — Z01811 Encounter for preprocedural respiratory examination: Secondary | ICD-10-CM

## 2019-01-09 DIAGNOSIS — Z85820 Personal history of malignant melanoma of skin: Secondary | ICD-10-CM | POA: Diagnosis not present

## 2019-01-09 DIAGNOSIS — Z79899 Other long term (current) drug therapy: Secondary | ICD-10-CM | POA: Diagnosis not present

## 2019-01-09 DIAGNOSIS — G92 Toxic encephalopathy: Secondary | ICD-10-CM | POA: Diagnosis not present

## 2019-01-09 DIAGNOSIS — M069 Rheumatoid arthritis, unspecified: Secondary | ICD-10-CM | POA: Diagnosis not present

## 2019-01-09 DIAGNOSIS — G8929 Other chronic pain: Secondary | ICD-10-CM | POA: Diagnosis not present

## 2019-01-09 DIAGNOSIS — I6340 Cerebral infarction due to embolism of unspecified cerebral artery: Secondary | ICD-10-CM | POA: Diagnosis not present

## 2019-01-09 DIAGNOSIS — Z8249 Family history of ischemic heart disease and other diseases of the circulatory system: Secondary | ICD-10-CM | POA: Diagnosis not present

## 2019-01-09 DIAGNOSIS — I130 Hypertensive heart and chronic kidney disease with heart failure and stage 1 through stage 4 chronic kidney disease, or unspecified chronic kidney disease: Secondary | ICD-10-CM | POA: Diagnosis not present

## 2019-01-09 DIAGNOSIS — N179 Acute kidney failure, unspecified: Secondary | ICD-10-CM | POA: Diagnosis not present

## 2019-01-09 DIAGNOSIS — E785 Hyperlipidemia, unspecified: Secondary | ICD-10-CM | POA: Diagnosis not present

## 2019-01-09 DIAGNOSIS — N183 Chronic kidney disease, stage 3 (moderate): Secondary | ICD-10-CM | POA: Diagnosis not present

## 2019-01-09 DIAGNOSIS — Z9049 Acquired absence of other specified parts of digestive tract: Secondary | ICD-10-CM | POA: Diagnosis not present

## 2019-01-09 LAB — BLOOD GAS, ARTERIAL
Acid-Base Excess: 5.2 mmol/L — ABNORMAL HIGH (ref 0.0–2.0)
Bicarbonate: 28.8 mmol/L — ABNORMAL HIGH (ref 20.0–28.0)
Drawn by: 421801
O2 Saturation: 97.6 %
PCO2 ART: 39.3 mmHg (ref 32.0–48.0)
Patient temperature: 98.6
pH, Arterial: 7.477 — ABNORMAL HIGH (ref 7.350–7.450)
pO2, Arterial: 102 mmHg (ref 83.0–108.0)

## 2019-01-09 LAB — URINALYSIS, ROUTINE W REFLEX MICROSCOPIC
Bacteria, UA: NONE SEEN
Bilirubin Urine: NEGATIVE
Glucose, UA: 50 mg/dL — AB
Hgb urine dipstick: NEGATIVE
Ketones, ur: NEGATIVE mg/dL
Leukocytes, UA: NEGATIVE
Nitrite: NEGATIVE
Protein, ur: 100 mg/dL — AB
Specific Gravity, Urine: 1.01 (ref 1.005–1.030)
pH: 6 (ref 5.0–8.0)

## 2019-01-09 LAB — COMPREHENSIVE METABOLIC PANEL
ALT: 14 U/L (ref 0–44)
AST: 17 U/L (ref 15–41)
Albumin: 3.9 g/dL (ref 3.5–5.0)
Alkaline Phosphatase: 74 U/L (ref 38–126)
Anion gap: 14 (ref 5–15)
BUN: 22 mg/dL (ref 8–23)
CO2: 24 mmol/L (ref 22–32)
Calcium: 9.2 mg/dL (ref 8.9–10.3)
Chloride: 96 mmol/L — ABNORMAL LOW (ref 98–111)
Creatinine, Ser: 1.68 mg/dL — ABNORMAL HIGH (ref 0.44–1.00)
GFR calc Af Amer: 35 mL/min — ABNORMAL LOW (ref >60)
GFR calc non Af Amer: 30 mL/min — ABNORMAL LOW (ref >60)
Glucose, Bld: 266 mg/dL — ABNORMAL HIGH (ref 70–99)
Potassium: 3.2 mmol/L — ABNORMAL LOW (ref 3.5–5.1)
Sodium: 134 mmol/L — ABNORMAL LOW (ref 135–145)
Total Bilirubin: 0.9 mg/dL (ref 0.3–1.2)
Total Protein: 7.3 g/dL (ref 6.5–8.1)

## 2019-01-09 LAB — PROTIME-INR
INR: 0.95
Prothrombin Time: 12.6 seconds (ref 11.4–15.2)

## 2019-01-09 LAB — CBC
HCT: 37.8 % (ref 36.0–46.0)
Hemoglobin: 12.5 g/dL (ref 12.0–15.0)
MCH: 28.7 pg (ref 26.0–34.0)
MCHC: 33.1 g/dL (ref 30.0–36.0)
MCV: 86.9 fL (ref 80.0–100.0)
Platelets: 301 10*3/uL (ref 150–400)
RBC: 4.35 MIL/uL (ref 3.87–5.11)
RDW: 14.6 % (ref 11.5–15.5)
WBC: 10.7 10*3/uL — ABNORMAL HIGH (ref 4.0–10.5)
nRBC: 0 % (ref 0.0–0.2)

## 2019-01-09 LAB — TYPE AND SCREEN
ABO/RH(D): O POS
Antibody Screen: NEGATIVE

## 2019-01-09 LAB — SURGICAL PCR SCREEN
MRSA, PCR: NEGATIVE
Staphylococcus aureus: POSITIVE — AB

## 2019-01-09 LAB — APTT: aPTT: 34 seconds (ref 24–36)

## 2019-01-09 LAB — BRAIN NATRIURETIC PEPTIDE: B Natriuretic Peptide: 129.6 pg/mL — ABNORMAL HIGH (ref 0.0–100.0)

## 2019-01-09 LAB — GLUCOSE, CAPILLARY: Glucose-Capillary: 282 mg/dL — ABNORMAL HIGH (ref 70–99)

## 2019-01-09 NOTE — Progress Notes (Signed)
Anesthesia Chart Review:  Case:  [id number] Date/Time:  01/10/19 1030   Procedures:      TRANSCATHETER AORTIC VALVE REPLACEMENT, TRANSFEMORAL (N/A Chest)     TRANSESOPHAGEAL ECHOCARDIOGRAM (TEE) (N/A )   Anesthesia type:  General   Pre-op diagnosis:  severe aortic stenosis   Location:  [facility] / [place] [facility]   Provider:  Burnell Blanks, MD      DISCUSSION: Patient is a 73 year old female scheduled for the above procedure. She underwent teeth extractions/alveoloplasty and gross debridement of remaining teeth on 08/17/18 in preparation of upcoming valve surgery; however, TAVR delayed to allow more time to get DM and hypothyroidism under control (TSH 116). She was placed on levothyroxine. She was re-admitted with acute on chronic diastolic CHF and COPD exacerbation in 10/2018 [facility]), and cardiology was contacted about "accerlerating the workup towards the procedure." Angelena Form, PA-C with [facility] saw her in follow-up on 12/21/18. Patient reported compliance with medication and noted to have abnormal, but improving TSH (31), although with worsening DM control (A1c 9.7). (I don't see that patient returned phone calls to review these results, but notes do not indicate that results will further delay surgery plans).   History includes severe AS, chronic diastolic CHF, bronchitis, DM2, hypothyroidism, HTN, HLD, RA, anemia, glaucoma, macular degeneration, CKD stage III, melanoma (left thigh 2010), never smoker. 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   As above, Angelena Form, PA-C has reviewed most recent A1c and TSH results. I also called her with Cr 1.68 results. (Although patient with CKD, her Creatinine from 08/04/18 ranged from 1.18-2.44--with initial Cr 1.35 on 08/04/18. She has been admitted to [facility] since then, but her lab results during that admission currently unknown.) Angelena Form, PA-C will further review, but will  likely plan on hydrating patient prior to procedure.     Preoperative CXR and PT/PTT results are still in process.   Anesthesiologist to evaluate on the day of surgery.    VS: BP (!) 163/64   Pulse 65   Temp 36.5 C   Resp 20   Ht 4\' 9"  (1.448 m)   Wt 64.3 kg   SpO2 98%   BMI 30.68 kg/m    PROVIDERS: Redmond School, MD is PCP  Kate Sable, MD is cardiologist.    LABS: Preoperative labs noted. Creatinine trends discussed above.  PT/PTT are still in process.  A1c 9.7 on 12/21/18, up from 7.8 5 months ago. TSH 31.420 on 12/21/18, but down from 116.220 5 months ago.  (all labs ordered are listed, but only abnormal results are displayed)  Labs Reviewed  GLUCOSE, CAPILLARY - Abnormal; Notable for the following components:      Result Value   Glucose-Capillary 282 (*)    All other components within normal limits  BLOOD GAS, ARTERIAL - Abnormal; Notable for the following components:   pH, Arterial 7.477 (*)    Bicarbonate 28.8 (*)    Acid-Base Excess 5.2 (*)    All other components within normal limits  BRAIN NATRIURETIC PEPTIDE - Abnormal; Notable for the following components:   B Natriuretic Peptide 129.6 (*)    All other components within normal limits  CBC - Abnormal; Notable for the following components:   WBC 10.7 (*)    All other components within normal limits  COMPREHENSIVE METABOLIC PANEL - Abnormal; Notable for the following components:   Sodium 134 (*)    Potassium 3.2 (*)  Chloride 96 (*)    Glucose, Bld 266 (*)    Creatinine, Ser 1.68 (*)    GFR calc non Af Amer 30 (*)    GFR calc Af Amer 35 (*)    All other components within normal limits  URINALYSIS, ROUTINE W REFLEX MICROSCOPIC - Abnormal; Notable for the following components:   Color, Urine STRAW (*)    Glucose, UA 50 (*)    Protein, ur 100 (*)    All other components within normal limits  SURGICAL PCR SCREEN  APTT  PROTIME-INR  TYPE AND SCREEN   Lab Results  Component Value Date    CREATININE 1.68 (H) 01/09/2019   CREATININE 1.18 (H) 08/18/2018   CREATININE 1.44 (H) 08/17/2018  Creatinine 08/15/18 2.02, 08/14/18 2.44.    PFTs: 11/02/18: FVC 1.13  54%, post 0.97  46%. FEV1 1.00  64%, post 0.86  55%. DLCO unc 10.34  69%, cor 10.68  72%.    IMAGES: CXR 01/09/19:  In process.  CT chest/abd/pelvis 08/18/18: IMPRESSION: 1. Vascular findings and measurements pertinent to potential TAVR procedure, as detailed above. 2. Severe thickening calcification of the aortic valve, compatible with the reported clinical history of severe aortic stenosis. 3. Small right pleural effusion lying dependently. 4. Mild cardiomegaly. 5. Aortic atherosclerosis, in addition to 2 vessel coronary artery disease. Assessment for potential risk factor modification, dietary therapy or pharmacologic therapy may be warranted, if clinically indicated. 6. Mild colonic diverticulosis without evidence of acute diverticulitis at this time. 7. Horseshoe kidney (normal anatomical variant) incidentally noted. 8. Additional incidental findings, as above.   EKG: 01/09/19: SB at 56 bpm. Incomplete right BBB.   CV: Echo 01/04/19: Study Conclusions - Left ventricle: The cavity size was normal. There was mild   concentric hypertrophy. Systolic function was normal. The   estimated ejection fraction was in the range of 50% to 55%. Wall   motion was normal; there were no regional wall motion   abnormalities. Doppler parameters are consistent with abnormal   left ventricular relaxation (grade 1 diastolic dysfunction).   Doppler parameters are consistent with elevated ventricular   end-diastolic filling pressure. - Aortic valve: Trileaflet; severely thickened, severely calcified   leaflets. Valve mobility was restricted. There was severe   stenosis. There was moderate regurgitation. Mean gradient (S): 35   mm Hg. Peak gradient (S): 57 mm Hg. - Mitral valve: Calcified annulus. Mildly thickened leaflets .    There was mild regurgitation. - Right ventricle: The cavity size was normal. Wall thickness was   normal. Systolic function was normal. - Right atrium: The atrium was normal in size. - Tricuspid valve: There was mild regurgitation. - Pulmonary arteries: The main pulmonary artery was normal-sized.   Systolic pressure was within the normal range. - Inferior vena cava: The vessel was normal in size. - Pericardium, extracardiac: There was no pericardial effusion. Impressions: - Since the last study on 07/13/2018 aortic stenosis has progressed   and is now severe.  CT coronary 08/18/18: IMPRESSION: 1. Calcified tri leaflet AV with annular area 429 mm2 Given size of sinuses best suited for a 26 mm Sapien 3 valve 2.  Optimum angiographic angle for deployment LAO 3 Caudal 3 degrees 3.  Coronary arteries sufficient height above annulus for deployment 4.  No LAA thrombus  Cardiac cath 08/11/18:  Prox RCA lesion is 30% stenosed.  Dist RCA lesion is 30% stenosed.  Ost RPDA to RPDA lesion is 70% stenosed.  Ost Cx to Prox Cx lesion is 20%  stenosed.  Ost 2nd Mrg to 2nd Mrg lesion is 20% stenosed.  Prox LAD to Mid LAD lesion is 30% stenosed.  Ost 1st Diag to 1st Diag lesion is 40% stenosed.  Ost Ramus to Ramus lesion is 40% stenosed. 1. Mild to moderate non-obstructive CAD 2. Aortic valve stenosis (peak to peak gradient 16 mmHg, mean 19.5 mmHg, AVA 1.09 cm2). Severe by echo criteria (AVA 0.53 cm2,DVI 0.19. Stroke volume/bsa 19.4). She is felt to have paradoxical low flow/low gradient AS.  Recommendations: Will continue workup for TAVR.  Carotid U/S 08/18/18: Final Interpretation: Right Carotid: The extracranial vessels were near-normal with only minimal wall                thickening or plaque. Left Carotid: The extracranial vessels were near-normal with only minimal wall               thickening or plaque. Vertebrals:  Bilateral vertebral arteries demonstrate antegrade flow. Subclavians:  Normal flow hemodynamics were seen in bilateral subclavian              arteries.   Past Medical History:  Diagnosis Date  . Anemia   . Anxiety   . Aortic stenosis   . Arthritis   . Blood transfusion without reported diagnosis   . Bronchitis   . CHF (congestive heart failure) ([place])   . CKD (chronic kidney disease) stage 3, GFR 30-59 ml/min (HCC) 08/18/2018  . Depression   . Diabetes mellitus   . Diverticulitis   . Dyspnea   . Glaucoma   . Heart murmur   . Hyperlipidemia   . Hypertension   . Hypothyroidism   . Macular degeneration   . Melanoma ([place])    left thigh, 2010  . Osteoarthritis   . Peptic ulcer   . Pre-diabetes   . Rheumatoid arthritis(714.0)   . Thyroid disease     Past Surgical History:  Procedure Laterality Date  . ABDOMINAL HYSTERECTOMY  1988  . APPENDECTOMY    . BACK SURGERY  07/2008   fusion  . BACK SURGERY  0/9983   complicated by post-op anemia requiring transfusion  . BREAST SURGERY     right breast biopsy  . CARDIAC CATHETERIZATION    . CATARACT EXTRACTION    . CHOLECYSTECTOMY  1974  . COLONOSCOPY  06/06/2007   Proximal diminutive rectal polyps cold biopsied/removed, otherwise normal rectum/ Left-sided diverticula, the remainder of the colonic mucosa appeared normal. Path-tubular adnemoa.   . COLONOSCOPY  09/14/2012   JAS:[id number] polyps-removed as described  above Colonic diverticulosis. Poor prep. next TCS five years.   . COLONOSCOPY N/A 10/29/2016   Procedure: COLONOSCOPY;  Surgeon: Daneil Dolin, MD;  [facility];  Service: Endoscopy;  Laterality: N/A;  2:00 pm  . ESOPHAGOGASTRODUODENOSCOPY  09/2012   Rourk: gastric ulcer, gastric duodenal erosions, small hh. benign bx  . ESOPHAGOGASTRODUODENOSCOPY N/A 10/29/2016   Procedure: ESOPHAGOGASTRODUODENOSCOPY (EGD);  Surgeon: Daneil Dolin, MD;  Location: [facility];  Service: Endoscopy;  Laterality: N/A;  . EYE SURGERY    . FOOT SURGERY  1994   left   . LUMBAR SPINE SURGERY   08/04/2014   dr Joya Salm  . macular degen    . MALONEY DILATION N/A 10/29/2016   Procedure: Venia Minks DILATION;  Surgeon: Daneil Dolin, MD;  Location: [facility];  Service: Endoscopy;  Laterality: N/A;  . MELANOMA EXCISION  2010  . MULTIPLE EXTRACTIONS WITH ALVEOLOPLASTY N/A 08/17/2018   Procedure: Extraction of tooth #'s  7,8, and 9 with alveoloplasty and gross debridement of remaining teeth;  Surgeon: Lenn Cal, DDS;  Location: [place];  Service: Oral Surgery;  Laterality: N/A;  . RIGHT/LEFT HEART CATH AND CORONARY ANGIOGRAPHY N/A 08/11/2018   Procedure: RIGHT/LEFT HEART CATH AND CORONARY ANGIOGRAPHY;  Surgeon: Burnell Blanks, MD;  Location: [place] CV LAB;  Service: Cardiovascular;  Laterality: N/A;  . YAG LASER APPLICATION     OS    MEDICATIONS: . acetaminophen (TYLENOL 8 HOUR ARTHRITIS PAIN) 650 MG CR tablet  . ALPRAZolam (XANAX) 0.5 MG tablet  . amLODipine (NORVASC) 10 MG tablet  . aspirin EC 81 MG tablet  . beta carotene w/minerals (OCUVITE) tablet  . Cholecalciferol ([employer]S VIT D 5000 HIGH-POTENCY) 5000 UNITS capsule  . cimetidine (TAGAMET) 200 MG tablet  . Cinnamon 500 MG capsule  . ferrous sulfate 325 (65 FE) MG tablet  . fluticasone (FLONASE) 50 MCG/ACT nasal spray  . furosemide (LASIX) 20 MG tablet  . gabapentin (NEURONTIN) 300 MG capsule  . glipiZIDE (GLUCOTROL) 10 MG tablet  . hydrochlorothiazide (HYDRODIURIL) 25 MG tablet  . HYDROcodone-acetaminophen (NORCO) 10-325 MG tablet  . latanoprost (XALATAN) 0.005 % ophthalmic solution  . levothyroxine (SYNTHROID, LEVOTHROID) 88 MCG tablet  . meloxicam (MOBIC) 15 MG tablet  . metFORMIN (GLUCOPHAGE) 1000 MG tablet  . Omega-3 Fatty Acids (FISH OIL) 1000 MG CAPS  . potassium chloride SA (K-DUR,KLOR-CON) 20 MEQ tablet  . Prenatal Vit-Fe Fumarate-FA (PRENATAL MULTIVITAMIN) TABS  . simvastatin (ZOCOR) 20 MG tablet  . timolol (TIMOPTIC) 0.5 % ophthalmic solution  . trolamine salicylate (ASPERCREME) 10 % cream  .  Turmeric 500 MG TABS  . VENTOLIN HFA 108 (90 Base) MCG/ACT inhaler  . vitamin B-12 (CYANOCOBALAMIN) 1000 MCG tablet  . vitamin C (ASCORBIC ACID) 500 MG tablet  . vitamin E 400 UNIT capsule   No current facility-administered medications for this encounter.     Myra Gianotti, PA-C Surgical Short Stay/Anesthesiology [facility] Phone [phone] [facility] Phone [phone] 01/09/2019 5:02 PM

## 2019-01-09 NOTE — Anesthesia Preprocedure Evaluation (Addendum)
Anesthesia Evaluation  Patient identified by MRN, date of birth, ID band Patient awake    Reviewed: Allergy & Precautions, NPO status , Patient's Chart, lab work & pertinent test results  History of Anesthesia Complications Negative for: history of anesthetic complications  Airway Mallampati: III  TM Distance: >3 FB Neck ROM: Full  Mouth opening: Limited Mouth Opening  Dental  (+) Dental Advisory Given   Pulmonary shortness of breath,    breath sounds clear to auscultation       Cardiovascular hypertension, Pt. on medications +CHF  + Valvular Problems/Murmurs AS and AI  Rhythm:Regular Rate:Normal + Systolic murmurs  '20 TTE - Mild concentric LVH. EF 50% to 55%.Grade 1 diastolic dysfunction.Severe AS, moderate AI. Mean gradient (S): 35 mm Hg. Peak gradient (S): 57 mm Hg. Mild MR and TR.  '19 Cath -  Prox RCA lesion is 30% stenosed.  Dist RCA lesion is 30% stenosed.  Ost RPDA to RPDA lesion is 70% stenosed.  Ost Cx to Prox Cx lesion is 20% stenosed.  Ost 2nd Mrg to 2nd Mrg lesion is 20% stenosed.  Prox LAD to Mid LAD lesion is 30% stenosed.  Ost 1st Diag to 1st Diag lesion is 40% stenosed.  Ost Ramus to Ramus lesion is 40% stenosed.    Neuro/Psych PSYCHIATRIC DISORDERS Anxiety Depression negative neurological ROS     GI/Hepatic Neg liver ROS, PUD,   Endo/Other  diabetes, Poorly Controlled, Type 2, Oral Hypoglycemic AgentsHypothyroidism  Obesity   Renal/GU CRFRenal disease     Musculoskeletal  (+) Arthritis , Osteoarthritis and Rheumatoid disorders,    Abdominal   Peds  Hematology negative hematology ROS (+)   Anesthesia Other Findings   Reproductive/Obstetrics                           Anesthesia Physical Anesthesia Plan  ASA: IV  Anesthesia Plan: MAC   Post-op Pain Management:    Induction: Intravenous  PONV Risk Score and Plan: 2 and Propofol infusion, Treatment may  vary due to age or medical condition and Ondansetron  Airway Management Planned: Natural Airway and Mask  Additional Equipment: None  Intra-op Plan:   Post-operative Plan:   Informed Consent: I have reviewed the patients History and Physical, chart, labs and discussed the procedure including the risks, benefits and alternatives for the proposed anesthesia with the patient or authorized representative who has indicated his/her understanding and acceptance.       Plan Discussed with: CRNA and Anesthesiologist  Anesthesia Plan Comments:       Anesthesia Quick Evaluation

## 2019-01-09 NOTE — Progress Notes (Signed)
I called a prescription for Mupirocin ointment to [facility], [place], [place]. 

## 2019-01-09 NOTE — Progress Notes (Addendum)
PCP - Dr. Redmond School  Cardiologist - Dr. Angelena Form and Dr. Bronson Ing  Chest x-ray - 01/09/19 EKG - 01/09/19 Stress Test - 01/25/18 ECHO - 01/04/19 Cardiac Cath - 08/11/18  Sleep Study - denies  Fasting Blood Sugar - 110-120 CBG at PAT 282-ate pop tarts and Ensure prior to coming to appointment.  Checks Blood Sugar 1 time a day Last A1C-9.7 on 12/21/18  Blood Thinner Instructions: N/A Aspirin Instructions:Continue ASA until DOS.   Anesthesia review: yes, hx and A1C 9.7  Patient denies shortness of breath, fever, cough and chest pain at PAT appointment   Patient verbalized understanding of instructions that were given to them at the PAT appointment. Patient was also instructed that they will need to review over the PAT instructions again at home before surgery.

## 2019-01-10 ENCOUNTER — Inpatient Hospital Stay (HOSPITAL_COMMUNITY): Payer: Medicare Other | Admitting: Certified Registered"

## 2019-01-10 ENCOUNTER — Other Ambulatory Visit: Payer: Self-pay

## 2019-01-10 ENCOUNTER — Inpatient Hospital Stay (HOSPITAL_COMMUNITY): Payer: Medicare Other

## 2019-01-10 ENCOUNTER — Encounter (HOSPITAL_COMMUNITY)
Admission: RE | Disposition: A | Discharge status: Rehabilitation Facility | Payer: Self-pay | Source: Home / Self Care | Attending: Cardiovascular Disease

## 2019-01-10 ENCOUNTER — Encounter (HOSPITAL_COMMUNITY): Payer: Self-pay | Admitting: *Deleted

## 2019-01-10 ENCOUNTER — Inpatient Hospital Stay (HOSPITAL_COMMUNITY)
Admission: RE | Admit: 2019-01-10 | Discharge: 2019-01-16 | DRG: 266 | Disposition: A | Discharge status: Rehabilitation Facility | Payer: Medicare Other | Attending: Cardiovascular Disease | Admitting: Cardiovascular Disease

## 2019-01-10 DIAGNOSIS — I6349 Cerebral infarction due to embolism of other cerebral artery: Secondary | ICD-10-CM | POA: Diagnosis not present

## 2019-01-10 DIAGNOSIS — Z79899 Other long term (current) drug therapy: Secondary | ICD-10-CM

## 2019-01-10 DIAGNOSIS — Z79890 Hormone replacement therapy (postmenopausal): Secondary | ICD-10-CM | POA: Diagnosis not present

## 2019-01-10 DIAGNOSIS — H409 Unspecified glaucoma: Secondary | ICD-10-CM | POA: Diagnosis present

## 2019-01-10 DIAGNOSIS — J984 Other disorders of lung: Secondary | ICD-10-CM | POA: Diagnosis not present

## 2019-01-10 DIAGNOSIS — H3530 Unspecified macular degeneration: Secondary | ICD-10-CM | POA: Diagnosis present

## 2019-01-10 DIAGNOSIS — N289 Disorder of kidney and ureter, unspecified: Secondary | ICD-10-CM | POA: Diagnosis not present

## 2019-01-10 DIAGNOSIS — D638 Anemia in other chronic diseases classified elsewhere: Secondary | ICD-10-CM | POA: Diagnosis present

## 2019-01-10 DIAGNOSIS — E119 Type 2 diabetes mellitus without complications: Secondary | ICD-10-CM

## 2019-01-10 DIAGNOSIS — Z981 Arthrodesis status: Secondary | ICD-10-CM | POA: Diagnosis not present

## 2019-01-10 DIAGNOSIS — I6529 Occlusion and stenosis of unspecified carotid artery: Secondary | ICD-10-CM

## 2019-01-10 DIAGNOSIS — Z954 Presence of other heart-valve replacement: Secondary | ICD-10-CM | POA: Diagnosis not present

## 2019-01-10 DIAGNOSIS — Z8249 Family history of ischemic heart disease and other diseases of the circulatory system: Secondary | ICD-10-CM

## 2019-01-10 DIAGNOSIS — E1165 Type 2 diabetes mellitus with hyperglycemia: Secondary | ICD-10-CM | POA: Diagnosis present

## 2019-01-10 DIAGNOSIS — Z85820 Personal history of malignant melanoma of skin: Secondary | ICD-10-CM

## 2019-01-10 DIAGNOSIS — E1122 Type 2 diabetes mellitus with diabetic chronic kidney disease: Secondary | ICD-10-CM | POA: Diagnosis present

## 2019-01-10 DIAGNOSIS — I509 Heart failure, unspecified: Secondary | ICD-10-CM | POA: Diagnosis not present

## 2019-01-10 DIAGNOSIS — D72823 Leukemoid reaction: Secondary | ICD-10-CM | POA: Diagnosis not present

## 2019-01-10 DIAGNOSIS — F419 Anxiety disorder, unspecified: Secondary | ICD-10-CM | POA: Diagnosis present

## 2019-01-10 DIAGNOSIS — I6340 Cerebral infarction due to embolism of unspecified cerebral artery: Secondary | ICD-10-CM | POA: Diagnosis not present

## 2019-01-10 DIAGNOSIS — Z7982 Long term (current) use of aspirin: Secondary | ICD-10-CM | POA: Diagnosis not present

## 2019-01-10 DIAGNOSIS — I10 Essential (primary) hypertension: Secondary | ICD-10-CM | POA: Diagnosis present

## 2019-01-10 DIAGNOSIS — T426X5A Adverse effect of other antiepileptic and sedative-hypnotic drugs, initial encounter: Secondary | ICD-10-CM | POA: Diagnosis present

## 2019-01-10 DIAGNOSIS — Z7951 Long term (current) use of inhaled steroids: Secondary | ICD-10-CM

## 2019-01-10 DIAGNOSIS — G9340 Encephalopathy, unspecified: Secondary | ICD-10-CM | POA: Diagnosis not present

## 2019-01-10 DIAGNOSIS — I350 Nonrheumatic aortic (valve) stenosis: Secondary | ICD-10-CM

## 2019-01-10 DIAGNOSIS — Z952 Presence of prosthetic heart valve: Secondary | ICD-10-CM

## 2019-01-10 DIAGNOSIS — E7849 Other hyperlipidemia: Secondary | ICD-10-CM | POA: Diagnosis not present

## 2019-01-10 DIAGNOSIS — N179 Acute kidney failure, unspecified: Secondary | ICD-10-CM

## 2019-01-10 DIAGNOSIS — I639 Cerebral infarction, unspecified: Secondary | ICD-10-CM | POA: Diagnosis not present

## 2019-01-10 DIAGNOSIS — D72829 Elevated white blood cell count, unspecified: Secondary | ICD-10-CM | POA: Diagnosis not present

## 2019-01-10 DIAGNOSIS — N183 Chronic kidney disease, stage 3 unspecified: Secondary | ICD-10-CM | POA: Diagnosis present

## 2019-01-10 DIAGNOSIS — Z90710 Acquired absence of both cervix and uterus: Secondary | ICD-10-CM | POA: Diagnosis not present

## 2019-01-10 DIAGNOSIS — E669 Obesity, unspecified: Secondary | ICD-10-CM | POA: Diagnosis present

## 2019-01-10 DIAGNOSIS — E1140 Type 2 diabetes mellitus with diabetic neuropathy, unspecified: Secondary | ICD-10-CM | POA: Diagnosis present

## 2019-01-10 DIAGNOSIS — M069 Rheumatoid arthritis, unspecified: Secondary | ICD-10-CM | POA: Diagnosis present

## 2019-01-10 DIAGNOSIS — E785 Hyperlipidemia, unspecified: Secondary | ICD-10-CM

## 2019-01-10 DIAGNOSIS — E876 Hypokalemia: Secondary | ICD-10-CM | POA: Diagnosis not present

## 2019-01-10 DIAGNOSIS — Z9049 Acquired absence of other specified parts of digestive tract: Secondary | ICD-10-CM | POA: Diagnosis not present

## 2019-01-10 DIAGNOSIS — I2510 Atherosclerotic heart disease of native coronary artery without angina pectoris: Secondary | ICD-10-CM | POA: Diagnosis not present

## 2019-01-10 DIAGNOSIS — R278 Other lack of coordination: Secondary | ICD-10-CM | POA: Diagnosis not present

## 2019-01-10 DIAGNOSIS — Z833 Family history of diabetes mellitus: Secondary | ICD-10-CM

## 2019-01-10 DIAGNOSIS — R5381 Other malaise: Secondary | ICD-10-CM | POA: Diagnosis not present

## 2019-01-10 DIAGNOSIS — E039 Hypothyroidism, unspecified: Secondary | ICD-10-CM | POA: Diagnosis present

## 2019-01-10 DIAGNOSIS — I5032 Chronic diastolic (congestive) heart failure: Secondary | ICD-10-CM | POA: Diagnosis present

## 2019-01-10 DIAGNOSIS — N189 Chronic kidney disease, unspecified: Secondary | ICD-10-CM | POA: Diagnosis not present

## 2019-01-10 DIAGNOSIS — R892 Abnormal level of other drugs, medicaments and biological substances in specimens from other organs, systems and tissues: Secondary | ICD-10-CM

## 2019-01-10 DIAGNOSIS — G92 Toxic encephalopathy: Secondary | ICD-10-CM | POA: Diagnosis not present

## 2019-01-10 DIAGNOSIS — Z006 Encounter for examination for normal comparison and control in clinical research program: Secondary | ICD-10-CM | POA: Diagnosis not present

## 2019-01-10 DIAGNOSIS — I130 Hypertensive heart and chronic kidney disease with heart failure and stage 1 through stage 4 chronic kidney disease, or unspecified chronic kidney disease: Secondary | ICD-10-CM | POA: Diagnosis not present

## 2019-01-10 DIAGNOSIS — F329 Major depressive disorder, single episode, unspecified: Secondary | ICD-10-CM | POA: Diagnosis present

## 2019-01-10 DIAGNOSIS — J449 Chronic obstructive pulmonary disease, unspecified: Secondary | ICD-10-CM | POA: Diagnosis present

## 2019-01-10 DIAGNOSIS — I352 Nonrheumatic aortic (valve) stenosis with insufficiency: Principal | ICD-10-CM | POA: Diagnosis present

## 2019-01-10 DIAGNOSIS — G8929 Other chronic pain: Secondary | ICD-10-CM | POA: Diagnosis present

## 2019-01-10 DIAGNOSIS — E1169 Type 2 diabetes mellitus with other specified complication: Secondary | ICD-10-CM | POA: Diagnosis not present

## 2019-01-10 DIAGNOSIS — Z7952 Long term (current) use of systemic steroids: Secondary | ICD-10-CM

## 2019-01-10 DIAGNOSIS — Z953 Presence of xenogenic heart valve: Secondary | ICD-10-CM | POA: Diagnosis not present

## 2019-01-10 DIAGNOSIS — G9341 Metabolic encephalopathy: Secondary | ICD-10-CM | POA: Diagnosis not present

## 2019-01-10 DIAGNOSIS — Z79891 Long term (current) use of opiate analgesic: Secondary | ICD-10-CM

## 2019-01-10 DIAGNOSIS — R2689 Other abnormalities of gait and mobility: Secondary | ICD-10-CM | POA: Diagnosis not present

## 2019-01-10 DIAGNOSIS — R0989 Other specified symptoms and signs involving the circulatory and respiratory systems: Secondary | ICD-10-CM | POA: Diagnosis not present

## 2019-01-10 DIAGNOSIS — G253 Myoclonus: Secondary | ICD-10-CM | POA: Diagnosis not present

## 2019-01-10 DIAGNOSIS — R062 Wheezing: Secondary | ICD-10-CM | POA: Diagnosis not present

## 2019-01-10 DIAGNOSIS — Z7984 Long term (current) use of oral hypoglycemic drugs: Secondary | ICD-10-CM | POA: Diagnosis not present

## 2019-01-10 DIAGNOSIS — E1142 Type 2 diabetes mellitus with diabetic polyneuropathy: Secondary | ICD-10-CM | POA: Diagnosis not present

## 2019-01-10 DIAGNOSIS — Z6830 Body mass index (BMI) 30.0-30.9, adult: Secondary | ICD-10-CM

## 2019-01-10 HISTORY — DX: Presence of prosthetic heart valve: Z95.2

## 2019-01-10 HISTORY — PX: TRANSCATHETER AORTIC VALVE REPLACEMENT, TRANSFEMORAL: SHX6400

## 2019-01-10 HISTORY — DX: Nonrheumatic aortic (valve) stenosis: I35.0

## 2019-01-10 HISTORY — PX: TEE WITHOUT CARDIOVERSION: SHX5443

## 2019-01-10 LAB — POCT I-STAT 4, (NA,K, GLUC, HGB,HCT)
Glucose, Bld: 189 mg/dL — ABNORMAL HIGH (ref 70–99)
Glucose, Bld: 193 mg/dL — ABNORMAL HIGH (ref 70–99)
HCT: 31 % — ABNORMAL LOW (ref 36.0–46.0)
HEMATOCRIT: 29 % — AB (ref 36.0–46.0)
Hemoglobin: 10.5 g/dL — ABNORMAL LOW (ref 12.0–15.0)
Hemoglobin: 9.9 g/dL — ABNORMAL LOW (ref 12.0–15.0)
Potassium: 2.5 mmol/L — CL (ref 3.5–5.1)
Potassium: 2.6 mmol/L — CL (ref 3.5–5.1)
Sodium: 141 mmol/L (ref 135–145)
Sodium: 141 mmol/L (ref 135–145)

## 2019-01-10 LAB — POCT ACTIVATED CLOTTING TIME
Activated Clotting Time: 142 seconds
Activated Clotting Time: 257 seconds
Activated Clotting Time: 114 seconds

## 2019-01-10 LAB — GLUCOSE, CAPILLARY
GLUCOSE-CAPILLARY: 223 mg/dL — AB (ref 70–99)
GLUCOSE-CAPILLARY: 238 mg/dL — AB (ref 70–99)
GLUCOSE-CAPILLARY: 77 mg/dL (ref 70–99)
Glucose-Capillary: 181 mg/dL — ABNORMAL HIGH (ref 70–99)
Glucose-Capillary: 183 mg/dL — ABNORMAL HIGH (ref 70–99)
Glucose-Capillary: 277 mg/dL — ABNORMAL HIGH (ref 70–99)

## 2019-01-10 LAB — POCT I-STAT 7, (LYTES, BLD GAS, ICA,H+H)
Acid-Base Excess: 1 mmol/L (ref 0.0–2.0)
Bicarbonate: 27.1 mmol/L (ref 20.0–28.0)
Calcium, Ion: 1.11 mmol/L — ABNORMAL LOW (ref 1.15–1.40)
HCT: 29 % — ABNORMAL LOW (ref 36.0–46.0)
Hemoglobin: 9.9 g/dL — ABNORMAL LOW (ref 12.0–15.0)
O2 Saturation: 94 %
Patient temperature: 97.3
Potassium: 2.8 mmol/L — ABNORMAL LOW (ref 3.5–5.1)
Sodium: 139 mmol/L (ref 135–145)
TCO2: 29 mmol/L (ref 22–32)
pCO2 arterial: 47.3 mmHg (ref 32.0–48.0)
pH, Arterial: 7.363 (ref 7.350–7.450)
pO2, Arterial: 70 mmHg — ABNORMAL LOW (ref 83.0–108.0)

## 2019-01-10 LAB — POCT I-STAT CREATININE: Creatinine, Ser: 1.6 mg/dL — ABNORMAL HIGH (ref 0.44–1.00)

## 2019-01-10 SURGERY — IMPLANTATION, AORTIC VALVE, TRANSCATHETER, FEMORAL APPROACH
Anesthesia: Monitor Anesthesia Care | Site: Chest

## 2019-01-10 MED ORDER — EPINEPHRINE PF 1 MG/10ML IJ SOSY
PREFILLED_SYRINGE | INTRAMUSCULAR | Status: DC | PRN
  Administered 2019-01-10 (×2): 20 ug via INTRAVENOUS
  Administered 2019-01-10: 30 ug via INTRAVENOUS

## 2019-01-10 MED ORDER — DOPAMINE-DEXTROSE 3.2-5 MG/ML-% IV SOLN
0.0000 ug/kg/min | INTRAVENOUS | Status: DC
  Filled 2019-01-10: qty 250

## 2019-01-10 MED ORDER — SODIUM CHLORIDE 0.9 % IV SOLN: INTRAVENOUS | Status: DC

## 2019-01-10 MED ORDER — SODIUM CHLORIDE 0.9 % IV SOLN
INTRAVENOUS | Status: AC
  Administered 2019-01-10: 14:00:00 via INTRAVENOUS

## 2019-01-10 MED ORDER — HEPARIN (PORCINE) IN NACL 1000-0.9 UT/500ML-% IV SOLN
INTRAVENOUS | Status: AC
  Filled 2019-01-10: qty 1000

## 2019-01-10 MED ORDER — VANCOMYCIN HCL IN DEXTROSE 1-5 GM/200ML-% IV SOLN
1000.0000 mg | Freq: Once | INTRAVENOUS | Status: AC
  Administered 2019-01-11: 1000 mg via INTRAVENOUS
  Filled 2019-01-10: qty 200

## 2019-01-10 MED ORDER — EPHEDRINE SULFATE-NACL 50-0.9 MG/10ML-% IV SOSY
PREFILLED_SYRINGE | INTRAVENOUS | Status: DC | PRN
  Administered 2019-01-10 (×2): 10 mg via INTRAVENOUS

## 2019-01-10 MED ORDER — CHLORHEXIDINE GLUCONATE 4 % EX LIQD
60.0000 mL | Freq: Once | CUTANEOUS | Status: DC
  Filled 2019-01-10: qty 60

## 2019-01-10 MED ORDER — VASOPRESSIN 20 UNIT/ML IV SOLN
INTRAVENOUS | Status: AC
  Filled 2019-01-10: qty 1

## 2019-01-10 MED ORDER — DEXMEDETOMIDINE HCL IN NACL 400 MCG/100ML IV SOLN
0.1000 ug/kg/h | INTRAVENOUS | Status: AC
  Administered 2019-01-10: .1 ug/kg/h via INTRAVENOUS
  Filled 2019-01-10: qty 100

## 2019-01-10 MED ORDER — EPINEPHRINE PF 1 MG/ML IJ SOLN
0.0000 ug/min | INTRAVENOUS | Status: DC
  Filled 2019-01-10: qty 4

## 2019-01-10 MED ORDER — SODIUM CHLORIDE 0.9 % IV SOLN: 250.0000 mL | INTRAVENOUS | Status: DC | PRN

## 2019-01-10 MED ORDER — SODIUM CHLORIDE 0.9% FLUSH: 3.0000 mL | INTRAVENOUS | Status: DC | PRN

## 2019-01-10 MED ORDER — FENTANYL CITRATE (PF) 100 MCG/2ML IJ SOLN
INTRAMUSCULAR | Status: DC | PRN
  Administered 2019-01-10: 25 ug via INTRAVENOUS

## 2019-01-10 MED ORDER — FLUTICASONE PROPIONATE 50 MCG/ACT NA SUSP
1.0000 | Freq: Every day | NASAL | Status: DC
  Administered 2019-01-10 – 2019-01-16 (×7): 1 via NASAL
  Filled 2019-01-10: qty 16

## 2019-01-10 MED ORDER — CHLORHEXIDINE GLUCONATE 0.12 % MT SOLN
15.0000 mL | Freq: Once | OROMUCOSAL | Status: AC
  Administered 2019-01-10: 15 mL via OROMUCOSAL
  Filled 2019-01-10 (×2): qty 15

## 2019-01-10 MED ORDER — VANCOMYCIN HCL 10 G IV SOLR
1250.0000 mg | INTRAVENOUS | Status: AC
  Administered 2019-01-10: 1250 mg via INTRAVENOUS
  Filled 2019-01-10: qty 1250

## 2019-01-10 MED ORDER — WHITE PETROLATUM EX OINT
TOPICAL_OINTMENT | CUTANEOUS | Status: AC
  Administered 2019-01-10: 0.2
  Filled 2019-01-10: qty 28.35

## 2019-01-10 MED ORDER — MORPHINE SULFATE (PF) 2 MG/ML IV SOLN: 1.0000 mg | INTRAVENOUS | Status: DC | PRN

## 2019-01-10 MED ORDER — NITROGLYCERIN 0.2 MG/ML ON CALL CATH LAB
INTRAVENOUS | Status: DC | PRN
  Administered 2019-01-10 (×2): 80 ug via INTRAVENOUS
  Administered 2019-01-10 (×2): 40 ug via INTRAVENOUS
  Administered 2019-01-10: 80 ug via INTRAVENOUS

## 2019-01-10 MED ORDER — NOREPINEPHRINE BITARTRATE 1 MG/ML IV SOLN
INTRAVENOUS | Status: DC | PRN
  Administered 2019-01-10 (×3): 2 mL via INTRAVENOUS

## 2019-01-10 MED ORDER — MIDAZOLAM HCL 2 MG/2ML IJ SOLN
INTRAMUSCULAR | Status: AC
  Filled 2019-01-10: qty 2

## 2019-01-10 MED ORDER — SODIUM CHLORIDE 0.9 % IV SOLN
1.5000 g | INTRAVENOUS | Status: AC
  Administered 2019-01-10: 1.5 g via INTRAVENOUS
  Filled 2019-01-10: qty 1.5

## 2019-01-10 MED ORDER — LIDOCAINE HCL (PF) 1 % IJ SOLN
INTRAMUSCULAR | Status: AC
  Filled 2019-01-10: qty 30

## 2019-01-10 MED ORDER — DEXMEDETOMIDINE HCL 200 MCG/2ML IV SOLN
INTRAVENOUS | Status: DC | PRN
  Administered 2019-01-10: .5 ug via INTRAVENOUS

## 2019-01-10 MED ORDER — SODIUM CHLORIDE 0.9 % IV SOLN
1.5000 g | Freq: Two times a day (BID) | INTRAVENOUS | Status: DC
  Administered 2019-01-10 – 2019-01-11 (×2): 1.5 g via INTRAVENOUS
  Filled 2019-01-10 (×2): qty 1.5

## 2019-01-10 MED ORDER — ACETAMINOPHEN 325 MG PO TABS
650.0000 mg | ORAL_TABLET | Freq: Four times a day (QID) | ORAL | Status: DC | PRN
  Administered 2019-01-11: 650 mg via ORAL
  Filled 2019-01-10: qty 2

## 2019-01-10 MED ORDER — TIMOLOL MALEATE 0.5 % OP SOLN
1.0000 [drp] | Freq: Two times a day (BID) | OPHTHALMIC | Status: DC
  Administered 2019-01-10 – 2019-01-16 (×12): 1 [drp] via OPHTHALMIC
  Filled 2019-01-10: qty 5

## 2019-01-10 MED ORDER — CLOPIDOGREL BISULFATE 75 MG PO TABS
75.0000 mg | ORAL_TABLET | Freq: Every day | ORAL | Status: DC
  Administered 2019-01-11 – 2019-01-16 (×6): 75 mg via ORAL
  Filled 2019-01-10 (×6): qty 1

## 2019-01-10 MED ORDER — LATANOPROST 0.005 % OP SOLN
1.0000 [drp] | Freq: Every day | OPHTHALMIC | Status: DC
  Administered 2019-01-10 – 2019-01-15 (×6): 1 [drp] via OPHTHALMIC
  Filled 2019-01-10: qty 2.5

## 2019-01-10 MED ORDER — PHENYLEPHRINE HCL-NACL 20-0.9 MG/250ML-% IV SOLN
0.0000 ug/min | INTRAVENOUS | Status: DC
  Filled 2019-01-10: qty 250

## 2019-01-10 MED ORDER — LIDOCAINE HCL (PF) 1 % IJ SOLN
INTRAMUSCULAR | Status: DC | PRN
  Administered 2019-01-10: 15 mL

## 2019-01-10 MED ORDER — SODIUM CHLORIDE 0.9 % IV SOLN
INTRAVENOUS | Status: DC
  Filled 2019-01-10: qty 30

## 2019-01-10 MED ORDER — INSULIN ASPART 100 UNIT/ML ~~LOC~~ SOLN
0.0000 [IU] | Freq: Three times a day (TID) | SUBCUTANEOUS | Status: DC
  Administered 2019-01-10: 12 [IU] via SUBCUTANEOUS
  Administered 2019-01-10: 8 [IU] via SUBCUTANEOUS
  Administered 2019-01-11: 4 [IU] via SUBCUTANEOUS
  Administered 2019-01-11: 12 [IU] via SUBCUTANEOUS
  Administered 2019-01-11: 4 [IU] via SUBCUTANEOUS
  Administered 2019-01-12: 2 [IU] via SUBCUTANEOUS
  Administered 2019-01-12: 4 [IU] via SUBCUTANEOUS
  Administered 2019-01-12: 8 [IU] via SUBCUTANEOUS
  Administered 2019-01-13: 4 [IU] via SUBCUTANEOUS
  Administered 2019-01-13: 8 [IU] via SUBCUTANEOUS
  Administered 2019-01-13: 16 [IU] via SUBCUTANEOUS
  Administered 2019-01-13: 4 [IU] via SUBCUTANEOUS
  Administered 2019-01-14 (×2): 8 [IU] via SUBCUTANEOUS
  Administered 2019-01-14: 4 [IU] via SUBCUTANEOUS
  Administered 2019-01-14: 8 [IU] via SUBCUTANEOUS
  Administered 2019-01-15: 2 [IU] via SUBCUTANEOUS
  Administered 2019-01-15 – 2019-01-16 (×3): 8 [IU] via SUBCUTANEOUS
  Administered 2019-01-16: 12 [IU] via SUBCUTANEOUS

## 2019-01-10 MED ORDER — PHENYLEPHRINE 40 MCG/ML (10ML) SYRINGE FOR IV PUSH (FOR BLOOD PRESSURE SUPPORT)
PREFILLED_SYRINGE | INTRAVENOUS | Status: DC | PRN
  Administered 2019-01-10: 120 ug via INTRAVENOUS

## 2019-01-10 MED ORDER — MAGNESIUM SULFATE 50 % IJ SOLN
40.0000 meq | INTRAMUSCULAR | Status: DC
  Filled 2019-01-10: qty 9.85

## 2019-01-10 MED ORDER — NOREPINEPHRINE-SODIUM CHLORIDE 4-0.9 MG/250ML-% IV SOLN
0.0000 ug/min | INTRAVENOUS | Status: AC
  Administered 2019-01-10: 2 ug/min via INTRAVENOUS
  Filled 2019-01-10: qty 250

## 2019-01-10 MED ORDER — NOREPINEPHRINE BITARTRATE 1 MG/ML IV SOLN
0.0000 ug/min | INTRAVENOUS | Status: DC
  Filled 2019-01-10: qty 4

## 2019-01-10 MED ORDER — MIDAZOLAM HCL 2 MG/2ML IJ SOLN
INTRAMUSCULAR | Status: DC | PRN
  Administered 2019-01-10: 1 mg via INTRAVENOUS

## 2019-01-10 MED ORDER — INSULIN REGULAR(HUMAN) IN NACL 100-0.9 UT/100ML-% IV SOLN
INTRAVENOUS | Status: DC
  Filled 2019-01-10: qty 100

## 2019-01-10 MED ORDER — POTASSIUM CHLORIDE 10 MEQ/50ML IV SOLN
10.0000 meq | INTRAVENOUS | Status: AC
  Administered 2019-01-10 (×2): 10 meq via INTRAVENOUS
  Filled 2019-01-10: qty 50

## 2019-01-10 MED ORDER — ALPRAZOLAM 0.5 MG PO TABS
0.5000 mg | ORAL_TABLET | Freq: Every evening | ORAL | Status: DC | PRN
  Administered 2019-01-11: 0.5 mg via ORAL
  Filled 2019-01-10: qty 1

## 2019-01-10 MED ORDER — LEVOTHYROXINE SODIUM 88 MCG PO TABS
88.0000 ug | ORAL_TABLET | Freq: Every day | ORAL | Status: DC
  Administered 2019-01-11 – 2019-01-16 (×6): 88 ug via ORAL
  Filled 2019-01-10 (×6): qty 1

## 2019-01-10 MED ORDER — NITROGLYCERIN IN D5W 200-5 MCG/ML-% IV SOLN: 0.0000 ug/min | INTRAVENOUS | Status: DC

## 2019-01-10 MED ORDER — INSULIN ASPART 100 UNIT/ML ~~LOC~~ SOLN
SUBCUTANEOUS | Status: AC
  Filled 2019-01-10: qty 1

## 2019-01-10 MED ORDER — SODIUM CHLORIDE 0.9% FLUSH
3.0000 mL | Freq: Two times a day (BID) | INTRAVENOUS | Status: DC
  Administered 2019-01-10 – 2019-01-16 (×12): 3 mL via INTRAVENOUS

## 2019-01-10 MED ORDER — NITROGLYCERIN IN D5W 200-5 MCG/ML-% IV SOLN
2.0000 ug/min | INTRAVENOUS | Status: DC
  Filled 2019-01-10: qty 250

## 2019-01-10 MED ORDER — METOPROLOL TARTRATE 5 MG/5ML IV SOLN
2.5000 mg | INTRAVENOUS | Status: DC | PRN
  Administered 2019-01-13: 2.5 mg via INTRAVENOUS
  Filled 2019-01-10: qty 5

## 2019-01-10 MED ORDER — PHENYLEPHRINE HCL-NACL 20-0.9 MG/250ML-% IV SOLN
30.0000 ug/min | INTRAVENOUS | Status: DC
  Filled 2019-01-10: qty 250

## 2019-01-10 MED ORDER — TRAMADOL HCL 50 MG PO TABS
50.0000 mg | ORAL_TABLET | ORAL | Status: DC | PRN
  Administered 2019-01-15: 100 mg via ORAL
  Filled 2019-01-10: qty 2

## 2019-01-10 MED ORDER — ONDANSETRON HCL 4 MG/2ML IJ SOLN
4.0000 mg | Freq: Four times a day (QID) | INTRAMUSCULAR | Status: DC | PRN
  Administered 2019-01-15: 4 mg via INTRAVENOUS
  Filled 2019-01-10: qty 2

## 2019-01-10 MED ORDER — MUPIROCIN 2 % EX OINT
1.0000 "application " | TOPICAL_OINTMENT | Freq: Once | CUTANEOUS | Status: AC
  Administered 2019-01-10: 1 via TOPICAL
  Filled 2019-01-10: qty 22

## 2019-01-10 MED ORDER — GABAPENTIN 300 MG PO CAPS
600.0000 mg | ORAL_CAPSULE | Freq: Three times a day (TID) | ORAL | Status: DC
  Administered 2019-01-10 – 2019-01-13 (×9): 600 mg via ORAL
  Filled 2019-01-10 (×9): qty 2

## 2019-01-10 MED ORDER — ACETAMINOPHEN 650 MG RE SUPP: 650.0000 mg | Freq: Four times a day (QID) | RECTAL | Status: DC | PRN

## 2019-01-10 MED ORDER — OXYCODONE HCL 5 MG PO TABS: 5.0000 mg | ORAL_TABLET | ORAL | Status: DC | PRN

## 2019-01-10 MED ORDER — POTASSIUM CHLORIDE 2 MEQ/ML IV SOLN
80.0000 meq | INTRAVENOUS | Status: DC
  Filled 2019-01-10: qty 40

## 2019-01-10 MED ORDER — HEPARIN SODIUM (PORCINE) 1000 UNIT/ML IJ SOLN
INTRAMUSCULAR | Status: DC | PRN
  Administered 2019-01-10: 10000 [IU] via INTRAVENOUS

## 2019-01-10 MED ORDER — PROTAMINE SULFATE 10 MG/ML IV SOLN
INTRAVENOUS | Status: DC | PRN
  Administered 2019-01-10: 100 mg via INTRAVENOUS

## 2019-01-10 MED ORDER — CHLORHEXIDINE GLUCONATE 4 % EX LIQD
30.0000 mL | CUTANEOUS | Status: DC
  Filled 2019-01-10: qty 30

## 2019-01-10 MED ORDER — POTASSIUM CHLORIDE 10 MEQ/100ML IV SOLN
INTRAVENOUS | Status: AC
  Filled 2019-01-10: qty 200

## 2019-01-10 MED ORDER — SIMVASTATIN 20 MG PO TABS
20.0000 mg | ORAL_TABLET | Freq: Every day | ORAL | Status: DC
  Administered 2019-01-10 – 2019-01-13 (×4): 20 mg via ORAL
  Filled 2019-01-10 (×4): qty 1

## 2019-01-10 MED ORDER — FENTANYL CITRATE (PF) 100 MCG/2ML IJ SOLN
INTRAMUSCULAR | Status: AC
  Filled 2019-01-10: qty 2

## 2019-01-10 MED ORDER — MUPIROCIN 2 % EX OINT
TOPICAL_OINTMENT | CUTANEOUS | Status: AC
  Administered 2019-01-10: 1 via TOPICAL
  Filled 2019-01-10: qty 22

## 2019-01-10 MED ORDER — ASPIRIN EC 81 MG PO TBEC
81.0000 mg | DELAYED_RELEASE_TABLET | Freq: Every day | ORAL | Status: DC
  Administered 2019-01-11 – 2019-01-16 (×6): 81 mg via ORAL
  Filled 2019-01-10 (×6): qty 1

## 2019-01-10 MED ORDER — ONDANSETRON HCL 4 MG/2ML IJ SOLN
INTRAMUSCULAR | Status: DC | PRN
  Administered 2019-01-10: 4 mg via INTRAVENOUS

## 2019-01-10 MED ORDER — FERROUS SULFATE 325 (65 FE) MG PO TABS
325.0000 mg | ORAL_TABLET | ORAL | Status: DC
  Administered 2019-01-12 – 2019-01-16 (×3): 325 mg via ORAL
  Filled 2019-01-10 (×5): qty 1

## 2019-01-10 MED ORDER — LACTATED RINGERS IV SOLN
INTRAVENOUS | Status: DC | PRN
  Administered 2019-01-10: 09:00:00 via INTRAVENOUS

## 2019-01-10 SURGICAL SUPPLY — 27 items
CABLE ADAPT CONN TEMP 6FT (ADAPTER) ×3 IMPLANT
CATH 23 EDWARDS DELIVERY SYS (CATHETERS) ×3 IMPLANT
CATH DIAG 6FR PIGTAIL ANGLED (CATHETERS) ×6 IMPLANT
CATH INFINITI 6F AL2 (CATHETERS) ×3 IMPLANT
CATH S G BIP PACING (CATHETERS) ×3 IMPLANT
CATH-GARD ARROW CATH SHIELD (MISCELLANEOUS) ×3
CRIMPER (MISCELLANEOUS) ×3 IMPLANT
DEVICE CLOSURE PERCLS PRGLD 6F (VASCULAR PRODUCTS) ×4 IMPLANT
GUIDEWIRE SAFE TJ AMPLATZ EXST (WIRE) ×3 IMPLANT
KIT HEART LEFT (KITS) ×3 IMPLANT
KIT MICROPUNCTURE NIT STIFF (SHEATH) ×3 IMPLANT
PACK CARDIAC CATHETERIZATION (CUSTOM PROCEDURE TRAY) ×3 IMPLANT
PERCLOSE PROGLIDE 6F (VASCULAR PRODUCTS) ×6
SHEATH 14X36 EDWARDS (SHEATH) ×3 IMPLANT
SHEATH BRITE TIP 6FR 35CM (SHEATH) ×3 IMPLANT
SHEATH PINNACLE 6F 10CM (SHEATH) ×3 IMPLANT
SHEATH PINNACLE 8F 10CM (SHEATH) ×3 IMPLANT
SHEATH PROBE COVER 6X72 (BAG) ×3 IMPLANT
SHIELD CATHGARD ARROW (MISCELLANEOUS) ×2 IMPLANT
STOPCOCK MORSE 400PSI 3WAY (MISCELLANEOUS) ×6 IMPLANT
TRANSDUCER W/STOPCOCK (MISCELLANEOUS) ×6 IMPLANT
TUBING ART PRESS 72  MALE/FEM (TUBING) ×1
TUBING ART PRESS 72 MALE/FEM (TUBING) ×2 IMPLANT
VALVE HEART TRANSCATH SZ3 23MM (Valve) ×3 IMPLANT
WIRE AMPLATZ SS-J .035X180CM (WIRE) ×3 IMPLANT
WIRE EMERALD 3MM-J .035X150CM (WIRE) ×3 IMPLANT
WIRE EMERALD ST .035X260CM (WIRE) ×3 IMPLANT

## 2019-01-10 NOTE — Anesthesia Procedure Notes (Signed)
Central Venous Catheter Insertion Performed by: Audry Pili, MD, anesthesiologist Start/End1/28/2020 9:20 AM, 01/10/2019 9:28 AM Patient location: Pre-op. Preanesthetic checklist: patient identified, IV checked, risks and benefits discussed, surgical consent, monitors and equipment checked, pre-op evaluation, timeout performed and anesthesia consent Position: Trendelenburg Lidocaine 1% used for infiltration and patient sedated Hand hygiene performed , maximum sterile barriers used  and Seldinger technique used Catheter size: 8.5 Fr Central line was placed.Double lumen Procedure performed using ultrasound guided technique. Ultrasound Notes:anatomy identified, needle tip was noted to be adjacent to the nerve/plexus identified, no ultrasound evidence of intravascular and/or intraneural injection and image(s) printed for medical record Attempts: 1 Following insertion, line sutured, dressing applied and Biopatch. Post procedure assessment: blood return through all ports, free fluid flow and no air  Patient tolerated the procedure well with no immediate complications.

## 2019-01-10 NOTE — CV Procedure (Signed)
HEART AND VASCULAR CENTER  TAVR OPERATIVE NOTE   Date of Procedure:  01/10/2019  Preoperative Diagnosis: Severe Aortic Stenosis   Postoperative Diagnosis: Same   Procedure:    Transcatheter Aortic Valve Replacement - Transfemoral Approach  Edwards Sapien 3 THV (size 23 mm, model # [id number], serial # [id number])   Co-Surgeons:  Lauree Chandler, MD and Valentina Gu. Roxy Manns, MD   Anesthesiologist:  Fransisco Beau  Echocardiographer:  Croitoru  Pre-operative Echo Findings:  Severe aortic stenosis  Normal left ventricular systolic function  Post-operative Echo Findings:  Trivial to mild paravalvular leak  Normal left ventricular systolic function  BRIEF CLINICAL NOTE AND INDICATIONS FOR SURGERY  73 yo female with history of chronic diastolic CHF, chronic lung disease, RA, DM, HLD, HTN, anemia and severe AS here today for TAVR. Recent admissions for CHF and exertional dyspnea.   During the course of the patient's preoperative work up they have been evaluated comprehensively by a multidisciplinary team of specialists coordinated through the [facility] in the [facility].  They have been demonstrated to suffer from symptomatic severe aortic stenosis as noted above. The patient has been counseled extensively as to the relative risks and benefits of all options for the treatment of severe aortic stenosis including long term medical therapy, conventional surgery for aortic valve replacement, and transcatheter aortic valve replacement.  The patient has been independently evaluated by Dr. Roxy Manns with CT surgery and she is felt to be at high risk for conventional surgical aortic valve replacement. The surgeon indicated the patient would be a poor candidate for conventional surgery. Based upon review of all of the patient's preoperative diagnostic tests they are felt to be candidate for transcatheter aortic valve replacement using the transfemoral  approach as an alternative to high risk conventional surgery.    Following the decision to proceed with transcatheter aortic valve replacement, a discussion has been held regarding what types of management strategies would be attempted intraoperatively in the event of life-threatening complications, including whether or not the patient would be considered a candidate for the use of cardiopulmonary bypass and/or conversion to open sternotomy for attempted surgical intervention.  The patient has been advised of a variety of complications that might develop peculiar to this approach including but not limited to risks of death, stroke, paravalvular leak, aortic dissection or other major vascular complications, aortic annulus rupture, device embolization, cardiac rupture or perforation, acute myocardial infarction, arrhythmia, heart block or bradycardia requiring permanent pacemaker placement, congestive heart failure, respiratory failure, renal failure, pneumonia, infection, other late complications related to structural valve deterioration or migration, or other complications that might ultimately cause a temporary or permanent loss of functional independence or other long term morbidity.  The patient provides full informed consent for the procedure as described and all questions were answered preoperatively.    DETAILS OF THE OPERATIVE PROCEDURE  PREPARATION:   The patient is brought to the operating room on the above mentioned date and central monitoring was established by the anesthesia team including placement of a radial arterial line. The patient is placed in the supine position on the operating table.  Intravenous antibiotics are administered. Conscious sedation is used.   Baseline transthoracic echocardiogram was performed. The patient's chest, abdomen, both groins, and both lower extremities are prepared and draped in a sterile manner. A time out procedure is performed.   PERIPHERAL ACCESS:    Using the modified Seldinger technique, femoral arterial and venous access were obtained with placement of  6 Fr sheaths on the left side using u/s guidance.  A pigtail diagnostic catheter was passed through the femoral arterial sheath under fluoroscopic guidance into the aortic root.  A temporary transvenous pacemaker catheter was passed through the femoral venous sheath under fluoroscopic guidance into the right ventricle.  The pacemaker was tested to ensure stable lead placement and pacemaker capture. Aortic root angiography was performed in order to determine the optimal angiographic angle for valve deployment.  TRANSFEMORAL ACCESS:  A micropuncture kit was used to gain access to the right femoral artery using u/s guidance. Position confirmed with angiography. Pre-closure with double ProGlide closure devices. The patient was heparinized systemically and ACT verified > 250 seconds.    A 14 Fr transfemoral E-sheath was introduced into the right femoral artery after progressively dilating over an Amplatz superstiff wire. An AL-1 catheter was used to direct a straight-tip exchange length wire across the native aortic valve into the left ventricle. This was exchanged out for a pigtail catheter and position was confirmed in the LV apex. Simultaneous LV and Ao pressures were recorded.  The pigtail catheter was then exchanged for an Amplatz Extra-stiff wire in the LV apex.   TRANSCATHETER HEART VALVE DEPLOYMENT:  An Edwards Sapien 3 THV (size 23 mm) was prepared and crimped per manufacturer's guidelines, and the proper orientation of the valve is confirmed on the [employer] delivery system. The valve was advanced through the introducer sheath using normal technique until in an appropriate position in the abdominal aorta beyond the sheath tip. The balloon was then retracted and using the fine-tuning wheel was centered on the valve. The valve was then advanced across the aortic arch using appropriate  flexion of the catheter. The valve was carefully positioned across the aortic valve annulus. The Commander catheter was retracted using normal technique. Once final position of the valve has been confirmed by angiographic assessment, the valve is deployed while temporarily holding ventilation and during rapid ventricular pacing to maintain systolic blood pressure < 50 mmHg and pulse pressure < 10 mmHg. The balloon inflation is held for >3 seconds after reaching full deployment volume. Once the balloon has fully deflated the balloon is retracted into the ascending aorta and valve function is assessed using TTE. There is felt to be mild paravalvular leak and no central aortic insufficiency.  The patient's hemodynamic recovery following valve deployment was slow. Aortic root angiography demonstrated severe AI that was not well seen on TTE. The wire was removed from the LV and the AI resolved. Her blood pressure immediately responded. The deployment balloon and guidewire are both removed. Echo demostrated acceptable post-procedural gradients, stable mitral valve function, and trivial to mild AI.   PROCEDURE COMPLETION:  The sheath was then removed and closure devices were completed. Protamine was administered once femoral arterial repair was complete. The temporary pacemaker, pigtail catheters and femoral sheaths were removed with manual pressure applied for hemostasis.    The patient tolerated the procedure well and is transported to the cath lab recovery area in stable condition. There were no immediate intraoperative complications. All sponge instrument and needle counts are verified correct at completion of the operation.   No blood products were administered during the operation.  Contrast load is documented in the procedure medication log.   Lauree Chandler MD 01/10/2019 1:15 PM

## 2019-01-10 NOTE — Op Note (Signed)
HEART AND VASCULAR CENTER   MULTIDISCIPLINARY HEART VALVE TEAM   TAVR OPERATIVE NOTE   Date of Procedure:  01/10/2019  Preoperative Diagnosis: Severe Aortic Stenosis   Postoperative Diagnosis: Same   Procedure:    Transcatheter Aortic Valve Replacement - Percutaneous Right Transfemoral Approach  Edwards Sapien 3 THV (size 23 mm, model # [id number], serial # [id number])   Co-Surgeons:  Lauree Chandler, MD and Valentina Gu. Roxy Manns, MD  Anesthesiologist:  Renold Don, MD  Echocardiographer:  Sanda Klein, MD  Pre-operative Echo Findings:  Severe aortic stenosis  Normal left ventricular systolic function  Post-operative Echo Findings:  Mild paravalvular leak  Normal left ventricular systolic function   BRIEF CLINICAL NOTE AND INDICATIONS FOR SURGERY  Patient is a 73 year old female with history of aortic stenosis, hypertension, hyperlipidemia, hypothyroidism, type 2 diabetes mellitus, chronic bronchitis, COPD, chronic back pain on long-term opioid pain relievers, and rheumatoid arthritis who has been referred for surgical consultation to discuss treatment options for management of aortic stenosis.  Patient states that she has known of presence of a heart murmur for several years.  She denies any known history of rheumatic fever or rheumatic heart disease.  Patient lives a somewhat sedentary lifestyle and has limited physical mobility.  She has been disabled since 2012 when she underwent her third and most recent back surgery.  She remains on chronic opioid pain relievers for chronic back pain.  She has a long history of poor exercise tolerance and underwent an exercise treadmill test in 2019 that was felt to be nondiagnostic.  She underwent routine transthoracic echocardiogram on July 13, 2018 which revealed normal left ventricular systolic function with ejection fraction estimated 50-55%.  There was aortic stenosis that was felt to possibly represent paradoxical low gradient  aortic stenosis with peak velocity across the aortic valve measured 2.85 m/s corresponding to mean transvalvular gradient estimated 20 mmHg.  The DVI was reportedly only 0.19 with aortic valve area calculated 0.53 cm.  There was felt to be mild aortic insufficiency with pressure half-time reported 573 ms.  She was referred to the multidisciplinary heart valve clinic and evaluated by Dr. Angelena Form on August 04, 2018.  She was scheduled for elective diagnostic cardiac catheterization, but she presented acutely with shortness of breath on August 09, 2018.  She was hospitalized for nearly a week and treated for an acute exacerbation of chronic diastolic congestive heart failure.  She was notably hypertensive during admission and she developed acute kidney injury.  Antihypertensive medications were adjusted.  She was also notably severely hypothyroid with TSH level measured 116.  Prior to that she apparently had not been taking her prescribed dose of Synthroid.  Synthroid was resumed and she underwent diagnostic cardiac catheterization during her admission on August 11, 2018.  She was found to have mild to moderate nonobstructive coronary artery disease.  Peak to peak and mean transvalvular gradients measured at catheterization were reported 16 and 19.5 mmHg, respectively, corresponding to aortic valve area calculated 1.09 cm.  Her cardiac output was measured 5.36 L/min corresponding to a cardiac index reportedly 3.48.  Right heart pressures were mildly elevated.  The patient was noted to have poor dentition during her admission and evaluated by Dr. Lawana Chambers.  She underwent CT angiography and was discharged home.  She later underwent elective dental extraction August 17, 2018.  She recovered from dental extraction uneventfully and was seen in follow-up last month by Rosaria Ferries at [facility].  She was referred for elective surgical consultation.  Follow-up  echocardiogram performed January 04, 2019 revealed  significant progression of disease with severe aortic stenosis and normal left ventricular systolic function.  Peak velocity across aortic valve had increased to 3.8 m/s corresponding to mean transvalvular gradient estimated 32 mmHg and DVI reported 0.23 with aortic valve area estimated 0.71 cm.  During the course of the patient's preoperative work up they have been evaluated comprehensively by a multidisciplinary team of specialists coordinated through the [facility] in the [facility].  They have been demonstrated to suffer from symptomatic severe aortic stenosis as noted above. The patient has been counseled extensively as to the relative risks and benefits of all options for the treatment of severe aortic stenosis including long term medical therapy, conventional surgery for aortic valve replacement, and transcatheter aortic valve replacement.  All questions have been answered, and the patient provides full informed consent for the operation as described.   DETAILS OF THE OPERATIVE PROCEDURE  PREPARATION:    The patient is brought to the cardiac cath lab on the above mentioned date and central monitoring was established by the anesthesia team including placement of a central venous line and radial arterial line. The patient is placed in the supine position on the operating table.  Intravenous antibiotics are administered. The patient is monitored closely throughout the procedure under conscious sedation.  Baseline transthoracic echocardiogram was performed. The patient's chest, abdomen, both groins, and both lower extremities are prepared and draped in a sterile manner. A time out procedure is performed.   PERIPHERAL ACCESS:    Using the modified Seldinger technique, femoral arterial and venous access was obtained with placement of 6 Fr sheaths on the left side.  A pigtail diagnostic catheter was passed through the left arterial sheath under  fluoroscopic guidance into the aortic root.  A temporary transvenous pacemaker catheter was passed through the left femoral venous sheath under fluoroscopic guidance into the right ventricle.  The pacemaker was tested to ensure stable lead placement and pacemaker capture. Aortic root angiography was performed in order to determine the optimal angiographic angle for valve deployment.   TRANSFEMORAL ACCESS:   Percutaneous transfemoral access and sheath placement was performed by Dr. Angelena Form using ultrasound guidance.  The right common femoral artery was cannulated using a micropuncture needle and appropriate location was verified using hand injection angiogram.  A pair of Abbott Perclose percutaneous closure devices were placed and a 6 French sheath replaced into the femoral artery.  The patient was heparinized systemically and ACT verified > 250 seconds.    A 14 Fr transfemoral E-sheath was introduced into the right common femoral artery after progressively dilating over an Amplatz superstiff wire. An AL-2 catheter was used to direct a straight-tip exchange length wire across the native aortic valve into the left ventricle. This was exchanged out for a pigtail catheter and position was confirmed in the LV apex. Simultaneous LV and Ao pressures were recorded.  The pigtail catheter was exchanged for an Amplatz Extra-stiff wire in the LV apex.  Echocardiography was utilized to confirm appropriate wire position and no sign of entanglement in the mitral subvalvular apparatus.   TRANSCATHETER HEART VALVE DEPLOYMENT:   An Edwards Sapien 3 transcatheter heart valve (size 23 mm, model #[id number], serial #[id number]) was prepared and crimped per manufacturer's guidelines, and the proper orientation of the valve is confirmed on the [employer] delivery system. The valve was advanced through the introducer sheath using normal technique until in an appropriate position in the  abdominal aorta beyond the sheath tip.  The balloon was then retracted and using the fine-tuning wheel was centered on the valve. The valve was then advanced across the aortic arch using appropriate flexion of the catheter. The valve was carefully positioned across the aortic valve annulus. The Commander catheter was retracted using normal technique. Once final position of the valve has been confirmed by angiographic assessment, the valve is deployed while temporarily holding ventilation and during rapid ventricular pacing to maintain systolic blood pressure < 50 mmHg and pulse pressure < 10 mmHg. The balloon inflation is held for >3 seconds after reaching full deployment volume. Once the balloon has fully deflated the balloon is retracted into the ascending aorta and valve function is assessed using echocardiography. Initially there is felt to be mild paravalvular leak and no central aortic insufficiency.  Following deployment the patient developed progressive hypotension refractory to intermittent dosing with pressors including Levophed and epinephrine.  Continuous reassessment using echocardiography reveals no significant pericardial effusion and normal left ventricular function.  However, the right ventricle becomes progressively dilated and ultimately left ventricular chamber enlargement ensues.  An aortic root injection is performed and notable for the presence of severe aortic insufficiency.  Repeat echocardiography confirms the presence of severe aortic insufficiency that appears to be intra-valvular and likely related to a pinned leaflet.  The deployment balloon and guidewire are both completely removed and the guidewire pulled back into the aortic arch.  The patient's aortic insufficiency resolves completely.  The patient immediately recovers with return of normal sinus rhythm and normal hemodynamic parameters.   PROCEDURE COMPLETION:   The sheath was removed and femoral artery closure performed by Dr Angelena Form.  Protamine was administered  once femoral arterial repair was complete. The temporary pacemaker, pigtail catheters and femoral sheaths were removed with manual pressure used for hemostasis.   The patient tolerated the procedure well and is transported to the surgical intensive care in stable condition. There were no immediate intraoperative complications. All sponge instrument and needle counts are verified correct at completion of the operation.   No blood products were administered during the operation.  The patient received a total of 60 mL of intravenous contrast during the procedure.   Rexene Alberts, MD 01/10/2019 12:55 PM

## 2019-01-10 NOTE — Interval H&P Note (Signed)
History and Physical Interval Note:  01/10/2019 10:07 AM  [name] [name]  has presented today for surgery, with the diagnosis of severe aortic stenosis  The various methods of treatment have been discussed with the patient and family. After consideration of risks, benefits and other options for treatment, the patient has consented to  Procedure(s): TRANSCATHETER AORTIC VALVE REPLACEMENT, TRANSFEMORAL (N/A) TRANSESOPHAGEAL ECHOCARDIOGRAM (TEE) (N/A) as a surgical intervention .  The patient's history has been reviewed, patient examined, no change in status, stable for surgery.  I have reviewed the patient's chart and labs.  Questions were answered to the patient's satisfaction.     Lauree Chandler

## 2019-01-10 NOTE — Progress Notes (Signed)
@  2118 Paged Dr. Delsa Sale on-call for Cardiology regarding pt's request to restart home Flonase, Timolol and Xalantan. Page returned and orders received for all requested medications.

## 2019-01-10 NOTE — Transfer of Care (Signed)
Immediate Anesthesia Transfer of Care Note  Patient: [name]  Procedure(s) Performed: TRANSCATHETER AORTIC VALVE REPLACEMENT, TRANSFEMORAL (N/A Chest) TRANSESOPHAGEAL ECHOCARDIOGRAM (TEE) (N/A )  Patient Location: Cath Lab  Anesthesia Type:MAC  Level of Consciousness: drowsy and patient cooperative  Airway & Oxygen Therapy: Patient Spontanous Breathing and Patient connected to face mask oxygen  Post-op Assessment: Report given to RN and Post -op Vital signs reviewed and stable  Post vital signs: Reviewed and stable  Last Vitals:  Vitals Value Taken Time  BP 104/43 01/10/2019  1:40 PM  Temp 36.3 C 01/10/2019  1:38 PM  Pulse 56 01/10/2019  1:44 PM  Resp 16 01/10/2019  1:44 PM  SpO2 97 % 01/10/2019  1:44 PM  Vitals shown include unvalidated device data.  Last Pain:  Vitals:   01/10/19 1338  TempSrc: Temporal  PainSc: Asleep      Patients Stated Pain Goal: 3 (19/14/78 2956)  Complications: No apparent anesthesia complications

## 2019-01-10 NOTE — Anesthesia Procedure Notes (Signed)
Arterial Line Insertion Start/End1/28/2020 9:00 AM, 01/10/2019 9:15 AM Performed by: Lance Coon, CRNA, CRNA  Patient location: Pre-op. Preanesthetic checklist: patient identified, IV checked, site marked, risks and benefits discussed, surgical consent, monitors and equipment checked, pre-op evaluation, timeout performed and anesthesia consent Lidocaine 1% used for infiltration Left, radial was placed Catheter size: 20 G Hand hygiene performed , maximum sterile barriers used  and Seldinger technique used  Attempts: 2 Procedure performed without using ultrasound guided technique. Following insertion, dressing applied and Biopatch. Post procedure assessment: normal and unchanged  Patient tolerated the procedure well with no immediate complications.

## 2019-01-10 NOTE — Progress Notes (Signed)
Site area: left radial arterial line removed Site Prior to Removal:  Level 0 Pressure Applied For: 10 minutes Manual:   yes Patient Status During Pull:  stable Post Pull Site:  Level 0 Post Pull Instructions Given:  yes Post Pull Pulses Present: left radial palpable Dressing Applied:  Gauze and tegaderm Bedrest begins @ NA Comments:

## 2019-01-10 NOTE — Progress Notes (Signed)
  [place] VALVE TEAM  Patient doing well s/p TAVR. She is hemodynamically stable. Groin sites stable. ECG with no high grade block but does show 1st deg AV block. Arterial line discontinued and transfered  to 4E. Plan for early ambulation after bedrest completed and hopeful discharge over the next 24-48 hours.   Angelena Form PA-C  [facility]  Pager [phone]

## 2019-01-10 NOTE — Anesthesia Postprocedure Evaluation (Signed)
Anesthesia Post Note  Patient: [name] [name]  Procedure(s) Performed: TRANSCATHETER AORTIC VALVE REPLACEMENT, TRANSFEMORAL (N/A Chest) TRANSESOPHAGEAL ECHOCARDIOGRAM (TEE) (N/A )     Patient location during evaluation: PACU Anesthesia Type: MAC Level of consciousness: awake and alert Pain management: pain level controlled Vital Signs Assessment: post-procedure vital signs reviewed and stable Respiratory status: spontaneous breathing, nonlabored ventilation and respiratory function stable Cardiovascular status: stable and blood pressure returned to baseline Anesthetic complications: no Comments: Patient stable postoperatively, doing well.    Last Vitals:  Vitals:   01/10/19 1630 01/10/19 1645  BP: (!) 103/48 (!) 109/45  Pulse: (!) 57 (!) 53  Resp: 14 13  Temp:    SpO2: 93% 92%    Last Pain:  Vitals:   01/10/19 1530  TempSrc: Oral  PainSc: 0-No pain                 Audry Pili

## 2019-01-10 NOTE — Anesthesia Procedure Notes (Signed)
Procedure Name: MAC Date/Time: 01/10/2019 11:14 AM Performed by: Lance Coon, CRNA Pre-anesthesia Checklist: Patient identified Patient Re-evaluated:Patient Re-evaluated prior to induction Oxygen Delivery Method: Simple face mask

## 2019-01-11 ENCOUNTER — Encounter (HOSPITAL_COMMUNITY): Payer: Self-pay | Admitting: Cardiovascular Disease

## 2019-01-11 ENCOUNTER — Inpatient Hospital Stay (HOSPITAL_COMMUNITY): Payer: Medicare Other

## 2019-01-11 ENCOUNTER — Other Ambulatory Visit: Payer: Self-pay | Admitting: Physician Assistant

## 2019-01-11 DIAGNOSIS — Z952 Presence of prosthetic heart valve: Secondary | ICD-10-CM

## 2019-01-11 DIAGNOSIS — I350 Nonrheumatic aortic (valve) stenosis: Secondary | ICD-10-CM

## 2019-01-11 LAB — GLUCOSE, CAPILLARY
Glucose-Capillary: 100 mg/dL — ABNORMAL HIGH (ref 70–99)
Glucose-Capillary: 259 mg/dL — ABNORMAL HIGH (ref 70–99)
Glucose-Capillary: 196 mg/dL — ABNORMAL HIGH (ref 70–99)
Glucose-Capillary: 193 mg/dL — ABNORMAL HIGH (ref 70–99)

## 2019-01-11 LAB — BASIC METABOLIC PANEL
Anion gap: 11 (ref 5–15)
BUN: 24 mg/dL — ABNORMAL HIGH (ref 8–23)
CO2: 28 mmol/L (ref 22–32)
Calcium: 8.2 mg/dL — ABNORMAL LOW (ref 8.9–10.3)
Chloride: 98 mmol/L (ref 98–111)
Creatinine, Ser: 1.76 mg/dL — ABNORMAL HIGH (ref 0.44–1.00)
GFR calc non Af Amer: 28 mL/min — ABNORMAL LOW (ref >60)
GFR, EST AFRICAN AMERICAN: 33 mL/min — AB (ref >60)
Glucose, Bld: 94 mg/dL (ref 70–99)
Potassium: 3.3 mmol/L — ABNORMAL LOW (ref 3.5–5.1)
Sodium: 137 mmol/L (ref 135–145)

## 2019-01-11 LAB — CBC
HCT: 29.4 % — ABNORMAL LOW (ref 36.0–46.0)
Hemoglobin: 9.7 g/dL — ABNORMAL LOW (ref 12.0–15.0)
MCH: 29.2 pg (ref 26.0–34.0)
MCHC: 33 g/dL (ref 30.0–36.0)
MCV: 88.6 fL (ref 80.0–100.0)
PLATELETS: 191 10*3/uL (ref 150–400)
RBC: 3.32 MIL/uL — AB (ref 3.87–5.11)
RDW: 14.9 % (ref 11.5–15.5)
WBC: 9.2 10*3/uL (ref 4.0–10.5)
nRBC: 0 % (ref 0.0–0.2)

## 2019-01-11 LAB — ECHOCARDIOGRAM LIMITED
Height: 57 in
Weight: 2324.8 oz

## 2019-01-11 LAB — MAGNESIUM: Magnesium: 2.3 mg/dL (ref 1.7–2.4)

## 2019-01-11 MED ORDER — POTASSIUM CHLORIDE CRYS ER 20 MEQ PO TBCR
40.0000 meq | EXTENDED_RELEASE_TABLET | Freq: Once | ORAL | Status: AC
  Administered 2019-01-11: 40 meq via ORAL
  Filled 2019-01-11: qty 2

## 2019-01-11 MED FILL — Heparin Sod (Porcine)-NaCl IV Soln 1000 Unit/500ML-0.9%: INTRAVENOUS | Qty: 1000 | Status: AC

## 2019-01-11 NOTE — Progress Notes (Signed)
CARDIAC REHAB PHASE I   PRE:  Rate/Rhythm: 78 SR  BP:  Sitting: 155/56      SaO2: 97 RA  Attempted to walk with pt. Standing on side of bed, pts knees kept buckling and she could not hold herself up. Pt helped back into bed. Pt states that she falls at home at least once a week. Requested PT see pt. Will continue to follow.  [phone] Rufina Falco, RN BSN 01/11/2019 2:37 PM

## 2019-01-11 NOTE — Progress Notes (Signed)
Pharmacy note: renal dosing  73 yo female s/p TAVR on zinacef for post-op prophylaxis (q12h x4 doses) -SCr= 1.76 , CrCl ~ 20-25 -3 doses of zinacef completed  Plan -With current renal function no further doses are needed -please contact pharmacy with any questions or concerns  Thank you, Hildred Laser, PharmD Clinical Pharmacist **Pharmacist phone directory can now be found on [email or web address] (PW [email or web address]).  Listed under [place].

## 2019-01-11 NOTE — Discharge Instructions (Signed)

## 2019-01-11 NOTE — Plan of Care (Signed)
  Problem: Activity: Goal: Risk for activity intolerance will decrease Outcome: Not Progressing   Problem: Respiratory: Goal: Respiratory status will improve Outcome: Progressing   Problem: Activity: Goal: Risk for activity intolerance will decrease Outcome: Not Progressing   Problem: Pain Managment: Goal: General experience of comfort will improve Outcome: Progressing

## 2019-01-11 NOTE — Progress Notes (Addendum)
[facility] TEAM  Patient Name: [name] [name] Date of Encounter: 01/11/2019  Primary Cardiologist: Dr. Bronson Ing / Dr. Angelena Form & Dr. Roxy Manns (TAVR)  Hospital Problem List     Principal Problem:   S/P TAVR (transcatheter aortic valve replacement) Active Problems:   Essential hypertension   Hyperlipidemia   Chronic diastolic CHF (congestive heart failure) (HCC)   CKD (chronic kidney disease) stage 3, GFR 30-59 ml/min (HCC)   Severe aortic stenosis   Rheumatoid arthritis ([place])   Hypothyroidism   Diabetes mellitus, type 2 ([place])     Subjective   Cant sleep well in hospital and hands are a little shaky but otherwise no complaints.   Inpatient Medications    Scheduled Meds: . aspirin EC  81 mg Oral Daily  . clopidogrel  75 mg Oral Q breakfast  . ferrous sulfate  325 mg Oral QODAY  . fluticasone  1 spray Each Nare Daily  . gabapentin  600 mg Oral TID  . insulin aspart  0-24 Units Subcutaneous TID AC & HS  . latanoprost  1 drop Both Eyes QHS  . levothyroxine  88 mcg Oral QAC breakfast  . potassium chloride  40 mEq Oral Once  . simvastatin  20 mg Oral QHS  . sodium chloride flush  3 mL Intravenous Q12H  . timolol  1 drop Both Eyes BID   Continuous Infusions: . sodium chloride    . cefUROXime (ZINACEF)  IV Stopped (01/10/19 2301)  . nitroGLYCERIN    . phenylephrine (NEO-SYNEPHRINE) Adult infusion    . vancomycin     PRN Meds: sodium chloride, acetaminophen **OR** acetaminophen, ALPRAZolam, metoprolol tartrate, morphine injection, ondansetron (ZOFRAN) IV, oxyCODONE, sodium chloride flush, traMADol   Vital Signs    Vitals:   01/10/19 2340 01/11/19 0000 01/11/19 0400 01/11/19 0500  BP:  (!) 106/48 (!) 118/49   Pulse: (!) 56 (!) 57 (!) 58   Resp: 14 17 15    Temp: (!) 97.5 F (36.4 C)  97.6 F (36.4 C)   TempSrc: Oral  Oral   SpO2: (!) 89% 95% 95%   Weight:    65.9 kg  Height:        Intake/Output Summary (Last 24  hours) at 01/11/2019 0910 Last data filed at 01/11/2019 0300 Gross per 24 hour  Intake 340 ml  Output 20 ml  Net 320 ml   Filed Weights   01/10/19 0825 01/11/19 0500  Weight: 62.6 kg 65.9 kg    Physical Exam    GEN: chronically ill and disheveled appreaing  HEENT: Grossly normal.  Neck: Supple, no JVD, carotid bruits, or masses. Cardiac: RRR, no murmurs, rubs, or gallops. No clubbing, cyanosis, edema.  Radials/DP/PT 2+ and equal bilaterally.  Respiratory:  Respirations regular and unlabored, clear to auscultation bilaterally. GI: Soft, nontender, nondistended, BS + x 4. MS: no deformity or atrophy. Skin: warm and dry, no rash. Groin sites are stable Neuro:  Strength and sensation are intact. Psych: AAOx3.  Normal affect.  Labs    CBC Recent Labs    01/09/19 1520  01/10/19 1344 01/11/19 0445  WBC 10.7*  --   --  9.2  HGB 12.5   < > 9.9* 9.7*  HCT 37.8   < > 29.0* 29.4*  MCV 86.9  --   --  88.6  PLT 301  --   --  191   < > = values in this interval not displayed.   Basic Metabolic Panel  Recent Labs    01/09/19 1520  01/10/19 1226 01/10/19 1344 01/10/19 1352 01/11/19 0445  NA 134*   < > 141 139  --  137  K 3.2*   < > 2.5* 2.8*  --  3.3*  CL 96*  --   --   --   --  98  CO2 24  --   --   --   --  28  GLUCOSE 266*   < > 189*  --   --  94  BUN 22  --   --   --   --  24*  CREATININE 1.68*  --   --   --  1.60* 1.76*  CALCIUM 9.2  --   --   --   --  8.2*  MG  --   --   --   --   --  2.3   < > = values in this interval not displayed.   Liver Function Tests Recent Labs    01/09/19 1520  AST 17  ALT 14  ALKPHOS 74  BILITOT 0.9  PROT 7.3  ALBUMIN 3.9   No results for input(s): LIPASE, AMYLASE in the last 72 hours. Cardiac Enzymes No results for input(s): CKTOTAL, CKMB, CKMBINDEX, TROPONINI in the last 72 hours. BNP Invalid input(s): POCBNP D-Dimer No results for input(s): DDIMER in the last 72 hours. Hemoglobin A1C No results for input(s): HGBA1C in the  last 72 hours. Fasting Lipid Panel No results for input(s): CHOL, HDL, LDLCALC, TRIG, CHOLHDL, LDLDIRECT in the last 72 hours. Thyroid Function Tests No results for input(s): TSH, T4TOTAL, T3FREE, THYROIDAB in the last 72 hours.  Invalid input(s): FREET3  Telemetry    sinus brady- Personally Reviewed  ECG    Sinus IRBBB (old) - Personally Reviewed  Radiology    Dg Chest 2 View  Result Date: 01/10/2019 CLINICAL DATA:  Preop for surgery. EXAM: CHEST - 2 VIEW COMPARISON:  10/15/2018 FINDINGS: The heart is enlarged but stable. Stable mild tortuosity and calcification of the thoracic aorta. Slightly prominent pulmonary hila are unchanged. Linear scarring changes in the left mid lung. No infiltrates, edema or effusions. The bony thorax is intact. IMPRESSION: Stable cardiac enlargement. No acute pulmonary findings. Electronically Signed   By: Marijo Sanes M.D.   On: 01/10/2019 08:04   Dg Chest Port 1 View  Result Date: 01/10/2019 CLINICAL DATA:  Status post aortic valve replacement. Assess life-support lines. EXAM: PORTABLE CHEST 1 VIEW COMPARISON:  Chest radiograph January 09, 2019 FINDINGS: Interval aortic valve replacement. Stable cardiomegaly. Calcified aortic arch. Fullness of the RIGHT pulmonary hila most compatible with vascular shadows. Stable LEFT lower lung zone scarring. No pleural effusion or focal consolidation. No pneumothorax. RIGHT internal jugular central venous catheter distal tip projects in distal superior vena cava. Lumbar spine hardware. IMPRESSION: 1. RIGHT internal jugular central venous catheter distal tip projects in distal superior vena cava. No pneumothorax. 2. Interval aortic valve replacement. 3. Stable cardiomegaly.  No acute pulmonary process. Electronically Signed   By: Elon Alas M.D.   On: 01/10/2019 16:56    Cardiac Studies   TAVR OPERATIVE NOTE   Date of Procedure:                01/10/2019  Preoperative Diagnosis:      Severe Aortic Stenosis     Postoperative Diagnosis:    Same   Procedure:        Transcatheter Aortic Valve Replacement - Percutaneous Right Transfemoral Approach  Edwards Sapien 3 THV (size 23 mm, model # [id number], serial # [id number])              Co-Surgeons:                        Lauree Chandler, MD and Valentina Gu. Roxy Manns, MD  Anesthesiologist:                  Renold Don, MD  Echocardiographer:              Sanda Klein, MD  Pre-operative Echo Findings: ? Severe aortic stenosis ? Normal left ventricular systolic function  Post-operative Echo Findings: ? Mild paravalvular leak ? Normal left ventricular systolic function ______________   Patient Profile     DELLANIRA DILLOW is a 73 y.o. female with a history of HTN, HLD, uncontrolled hypothyroidism, DMT2, chronic bronchitis, CKD stage III, RA and severe aortic stenosis who presented to [facility] on 01/10/2019 for planned TAVR.  Assessment & Plan    Severe AS: s/p successful TAVR with a 23 mm Edwards Sapien 3 THV via the TF approach on 01/10/19. Post operative echo to be done today. Groin sites are stable. ECG with sinus with IRBBB (which is old) but no high grade heart block. Continue Asprin and plavix. Possible DC home later this afternoon if echo looks good and she continues to feel okay.  HTN: BP has been in good range on no antihypertensives   DMT2: continue SSI   Hypothyroidism: continue synthroid  CKD: creat up to 1.7. Review of labs show labile kidney function that ranges between 1.18-2.44. Will hold home diuretic and continue to monitor. If she goes home today, I will repeat a BMET at 1 week [place] appointment.  Signed, Angelena Form, PA-C  01/11/2019, 9:10 AM  Pager [phone]  I have personally seen and examined this patient with Angelena Form, PA-C. I agree with the assessment and plan as outlined above. She is doing well one day post TAVR from the right transfemoral approach. BP is stable. Incomplete RBBB at  baseline and this am. No high grade heart block. Echo has been done but images are not loaded yet. If echo is ok, will plan to d/c home today on ASA and Plavix.   Lauree Chandler 01/11/2019 10:36 AM

## 2019-01-11 NOTE — Progress Notes (Signed)
@  6435 pt stood at side of bed but said she "felt wobbly" and wanted to lay back down rather than walk. VSS, and CBG 100. BL groin sites still level 0. Day RN informed.

## 2019-01-11 NOTE — Progress Notes (Signed)
  Echocardiogram 2D Echocardiogram limited post TAVR has been performed.  Darlina Sicilian M 01/11/2019, 9:43 AM

## 2019-01-12 ENCOUNTER — Encounter (HOSPITAL_COMMUNITY): Payer: Self-pay | Admitting: General Practice

## 2019-01-12 DIAGNOSIS — R5381 Other malaise: Secondary | ICD-10-CM

## 2019-01-12 LAB — GLUCOSE, CAPILLARY
GLUCOSE-CAPILLARY: 220 mg/dL — AB (ref 70–99)
Glucose-Capillary: 125 mg/dL — ABNORMAL HIGH (ref 70–99)
Glucose-Capillary: 145 mg/dL — ABNORMAL HIGH (ref 70–99)
Glucose-Capillary: 175 mg/dL — ABNORMAL HIGH (ref 70–99)

## 2019-01-12 LAB — CBC
HEMATOCRIT: 28.6 % — AB (ref 36.0–46.0)
Hemoglobin: 9.2 g/dL — ABNORMAL LOW (ref 12.0–15.0)
MCH: 28.7 pg (ref 26.0–34.0)
MCHC: 32.2 g/dL (ref 30.0–36.0)
MCV: 89.1 fL (ref 80.0–100.0)
Platelets: 160 10*3/uL (ref 150–400)
RBC: 3.21 MIL/uL — AB (ref 3.87–5.11)
RDW: 14.8 % (ref 11.5–15.5)
WBC: 8.5 10*3/uL (ref 4.0–10.5)
nRBC: 0 % (ref 0.0–0.2)

## 2019-01-12 LAB — BASIC METABOLIC PANEL
ANION GAP: 9 (ref 5–15)
BUN: 29 mg/dL — ABNORMAL HIGH (ref 8–23)
CO2: 30 mmol/L (ref 22–32)
Calcium: 7.9 mg/dL — ABNORMAL LOW (ref 8.9–10.3)
Chloride: 98 mmol/L (ref 98–111)
Creatinine, Ser: 2.08 mg/dL — ABNORMAL HIGH (ref 0.44–1.00)
GFR calc Af Amer: 27 mL/min — ABNORMAL LOW (ref >60)
GFR calc non Af Amer: 23 mL/min — ABNORMAL LOW (ref >60)
Glucose, Bld: 130 mg/dL — ABNORMAL HIGH (ref 70–99)
POTASSIUM: 3.5 mmol/L (ref 3.5–5.1)
Sodium: 137 mmol/L (ref 135–145)

## 2019-01-12 MED ORDER — CLOPIDOGREL BISULFATE 75 MG PO TABS: 75.0000 mg | ORAL_TABLET | Freq: Every day | ORAL | 1 refills | Status: DC

## 2019-01-12 MED FILL — Magnesium Sulfate Inj 50%: INTRAMUSCULAR | Qty: 10 | Status: AC

## 2019-01-12 MED FILL — Potassium Chloride Inj 2 mEq/ML: INTRAVENOUS | Qty: 40 | Status: AC

## 2019-01-12 MED FILL — Heparin Sodium (Porcine) Inj 1000 Unit/ML: INTRAMUSCULAR | Qty: 30 | Status: AC

## 2019-01-12 NOTE — Clinical Social Work Note (Addendum)
Clinical Social Work Assessment  Patient Details  Name: [name] [name] MRN: [id number] Date of Birth: [birth year]  Date of referral:  01/12/19               Reason for consult:  Facility Placement                Permission sought to share information with:  Facility Art therapist granted to share information::  Yes, Verbal Permission Granted  Name::     [name] [name]   Agency::     Relationship::  daughter  Contact Information:  [phone]  Housing/Transportation Living arrangements for the past 2 months:  [place] of Information:  Patient, Adult Children Patient Interpreter Needed:  None Criminal Activity/Legal Involvement Pertinent to Current Situation/Hospitalization:  No - Comment as needed Significant Relationships:  Adult Children Lives with:  Adult Children Do you feel safe going back to the place where you live?  Yes Need for family participation in patient care:  Yes (Comment)  Care giving concerns:  CSW received consult for discharge needs. CSW spoke with patient and her daughter, [name] [name], regarding possible SNF placement at time of discharge.  Patient and family refused SNF at this time. Patient's daughter states patient lives with family members that are willing to assist her.     Social Worker assessment / plan:  CSW spoke with patient and her family  concerning possibility of rehab at [facility] before returning home. Patient and her family refuse at this time.    Employment status:  Other (Comment) Insurance information:  Medicare PT Recommendations:  Inpatient Rehab Consult Information / Referral to community resources:     Patient/Family's Response to care:  Patient and family  recognizes need for rehab but prefer the receives the care she needs in the home.    Patient/Family's Understanding of and Emotional Response to Diagnosis, Current Treatment, and Prognosis:  Patient/family is realistic regarding therapy needs and  expressed being hopeful that the patient go home and get the care she needs. Patient's family  expressed understanding of CSW role and discharge process as well as medical condition. No questions/concerns about plan or treatment at this time.   Emotional Assessment Appearance:  Appears stated age Attitude/Demeanor/Rapport:  Engaged Affect (typically observed):  Appropriate, Accepting, Pleasant Orientation:  Oriented to Situation, Oriented to  Time, Oriented to Place, Oriented to Self Alcohol / Substance use:  Not Applicable Psych involvement (Current and /or in the community):  No (Comment)  Discharge Needs  Concerns to be addressed:  Care Coordination Readmission within the last 30 days:    Current discharge risk:  Dependent with Mobility Barriers to Discharge:  Continued Medical Work up   [employer], [place] 01/12/2019, 5:57 PM

## 2019-01-12 NOTE — Progress Notes (Signed)
Patient's daughter called out stating that her mother was trying to get up and was on the floor. Upon arrival to room, this RN and Gwyndolyn Saxon, [place] found the patient lying on the floor by the bed. Patient's head was laying on top of the base of the IV pole. Pillow was placed under the patient's head and the IV pole was gently removed out of the way. Charge RN and NT got the patient back into the bed after an assessment was done prior to moving her. Patient seemed to be somewhat confused and could not tell this RN where she was at the time. VSS. Floor mats placed and bed alarm on.

## 2019-01-12 NOTE — Progress Notes (Signed)
Notified by the nurse tech while in another room with a new admission that just arrived from ED that patients daughter called that patient is sitting on the floor. Charge nurse and another nurse went in to assess patient. MD on call notified. No new orders at this time. On re- assessment  patient denies any pain. No injury noted. Vitals stable. Will continue to monitor for any changes. Bed alarm on, floor mat placed. Family encourage to call for assistance.

## 2019-01-12 NOTE — Discharge Summary (Addendum)
[facility] VALVE TEAM  Discharge Summary    Patient ID: [name] MRN: [id number]; DOB: March 18, 1946  Admit date: 01/10/2019 Discharge date: 01/12/2019  Primary Care Provider: Redmond School, MD  Primary Cardiologist: Kate Sable, MD / Dr. Angelena Form & Dr. Roxy Manns  Discharge Diagnoses    Principal Problem:   S/P TAVR (transcatheter aortic valve replacement) Active Problems:   Essential hypertension   Hyperlipidemia   Chronic diastolic CHF (congestive heart failure) (HCC)   CKD (chronic kidney disease) stage 3, GFR 30-59 ml/min (HCC)   Severe aortic stenosis   Rheumatoid arthritis ([place])   Hypothyroidism   Diabetes mellitus, type 2 (HCC) Hypokalemia   Allergies No Known Allergies  Diagnostic Studies/Procedures    TAVR OPERATIVE NOTE   Date of Procedure:01/10/2019  Preoperative Diagnosis:Severe Aortic Stenosis   Postoperative Diagnosis:Same   Procedure:   Transcatheter Aortic Valve Replacement - PercutaneousRightTransfemoral Approach Edwards Sapien 3 THV (size 12mm, model # 9600TFX, serial # [id number])  Co-Surgeons:Jolon Degante Angelena Form, MD andClarence H. Roxy Manns, MD  Anesthesiologist:Thomas Fransisco Beau, MD  Echocardiographer:Mihai Croitoru, MD  Pre-operative Echo Findings: ? Severe aortic stenosis ? Normalleft ventricular systolic function  Post-operative Echo Findings: ? Mildparavalvular leak ? Normalleft ventricular systolic function  _____________   Echo 01/11/2019 IMPRESSIONS:  1. A 64mm Edwards Sapien bioprosthetic aortic valve valve is present in the aortic position. The prosthesis was placed on 01/10/2019.  2. AV Mean Grad:11.0 mmHg.  3. Mild paravalvular regurgitation, no prosthetic regurgitation. of the aortic prosthesis.  4. The left ventricle appears to be normal in size, has mild wall  thickness 60-65% ejection fraction Spectral Doppler shows impaired relaxation pattern of diastolic filling.  5. The right ventricle has normal size and normal systolic function.  6. Right ventricular systolic pressure is could not be assessed.  7. Mitral valve regurgitation is trivial by color flow Doppler.  8. MV Mean Dias grad:4.0 mmHg, HR 73 bpm.  9. Pericardium: There is no evidence of pericardial effusion.  History of Present Illness     [name] [name] is a 73 y.o. female with a history of HTN, HLD, uncontrolled hypothyroidism, DMT2, chronic bronchitis, CKD stage III, RA and severe aortic stenosis who presented to [facility] on 01/10/2019 for planned TAVR.  Patient states that she has known of presence of a heart murmur for several years.Patient lives a somewhat sedentary lifestyle and has limited physical mobility. She has been disabled since 2012 when she underwent her third and most recent back surgery. She remains on chronic opioid pain relievers for chronic back pain. She has a long history of poor exercise tolerance and underwent an exercise treadmill test in 2019 that was felt to be nondiagnostic. She underwent routine transthoracic echocardiogram on July 13, 2018 which revealed normal left ventricular systolic function with ejection fraction estimated 50-55%. There was aortic stenosis that was felt to possibly represent paradoxical low gradient aortic stenosis with peak velocity across the aortic valve measured 2.85 m/s corresponding to mean transvalvular gradient estimated 20 mmHg.She was referred to the multidisciplinary heart valve clinic and evaluated by Dr. Angelena Form on August 04, 2018. She was scheduled for elective diagnostic cardiac catheterization, but she presented acutely with shortness of breath on August 09, 2018. She was hospitalized for nearly a week and treated for an acute exacerbation of chronic diastolic congestive heart failure. She was notably hypertensive during  admission and she developed acute kidney injury. Antihypertensive medications were adjusted. She was also notably severely hypothyroid with TSH level measured 116.  Prior to that she apparently had not been taking her prescribed dose of Synthroid. Synthroid was resumed and she underwent diagnostic cardiac catheterization during her admission on August 11, 2018. She was found to have mild to moderate nonobstructive coronary artery disease. Peak to peak and mean transvalvular gradients measured at catheterization were reported 16 and 19.5 mmHg, respectively, corresponding to aortic valve area calculated 1.09 cm. The patient was noted to have poor dentition during her admission and evaluated by Dr. Lawana Chambers.She underwent CT angiography and was discharged home. She later underwent elective dental extraction August 17, 2018. She recovered from dental extraction uneventfully and was seen in follow-up last month by Rosaria Ferries [place].She was referred for elective surgical consultation.  Follow-up echocardiogram performed January 04, 2019 revealed significant progression of disease with severe aortic stenosis and normal left ventricular systolic function.  Peak velocity across aortic valve had increased to 3.8 m/s corresponding to mean transvalvular gradient estimated 32 mmHg and DVI reported 0.23 with aortic valve area estimated 0.71 cm. Hypokalemia during admission.   The patient has been evaluated by the multidisciplinary valve team and felt to have severe, symptomatic aortic stenosis and to be a suitable candidate for TAVR, which was set up for 01/10/2019.    Hospital Course     Consultants: none  Severe AS:s/p successful TAVR with a 23 mm Edwards Sapien 3 THV via the TF approach on 01/10/19. Post operative echo showed EF 60% with normally functioning TAVR with mean gradient 11 mmHg and mild PVL. Groin sites are stable. ECG with sinus with IRBBB (which is old) but no high grade  heart block. Continue Asprin and plavix. Plan for discharge home today with 1 week TOC next week  HTN: BP has been in good range on no antihypertensives. Home amlodipine 10mg  and HCTZ 25mg  have been held at discharge. I will resume as an outpatient if BP elevated and renal function stable.   DMT2: continue SSI. Resume home regimen at discharge including Metformin as it has been >48 hours since contrast dye exposure  Hypothyroidism: continue synthroid  CKD: creat up to 2 today. Review of labs show labile kidney function that ranges between 1.18-2.44. Will hold home diuretic and continue to monitor. I will get a BMET next week. Pharmacy also voiced concerns over her gabapentin dose with her renal insufficiency. I will make sure she has close follow up with PCP _____________  Discharge Vitals Blood pressure (!) 134/50, pulse 64, temperature 98.7 F (37.1 C), temperature source Oral, resp. rate 13, height 4\' 9"  (1.448 m), weight 65.9 kg, SpO2 96 %.  Filed Weights   01/10/19 0825 01/11/19 0500  Weight: 62.6 kg 65.9 kg    Labs & Radiologic Studies    CBC Recent Labs    01/11/19 0445 01/12/19 0456  WBC 9.2 8.5  HGB 9.7* 9.2*  HCT 29.4* 28.6*  MCV 88.6 89.1  PLT 191 761   Basic Metabolic Panel Recent Labs    01/11/19 0445 01/12/19 0456  NA 137 137  K 3.3* 3.5  CL 98 98  CO2 28 30  GLUCOSE 94 130*  BUN 24* 29*  CREATININE 1.76* 2.08*  CALCIUM 8.2* 7.9*  MG 2.3  --    Liver Function Tests Recent Labs    01/09/19 1520  AST 17  ALT 14  ALKPHOS 74  BILITOT 0.9  PROT 7.3  ALBUMIN 3.9   No results for input(s): LIPASE, AMYLASE in the last 72 hours. Cardiac Enzymes No results for input(s): CKTOTAL,  CKMB, CKMBINDEX, TROPONINI in the last 72 hours. BNP Invalid input(s): POCBNP D-Dimer No results for input(s): DDIMER in the last 72 hours. Hemoglobin A1C No results for input(s): HGBA1C in the last 72 hours. Fasting Lipid Panel No results for input(s): CHOL, HDL,  LDLCALC, TRIG, CHOLHDL, LDLDIRECT in the last 72 hours. Thyroid Function Tests No results for input(s): TSH, T4TOTAL, T3FREE, THYROIDAB in the last 72 hours.  Invalid input(s): FREET3 _____________  Dg Chest 2 View  Result Date: 01/10/2019 CLINICAL DATA:  Preop for surgery. EXAM: CHEST - 2 VIEW COMPARISON:  10/15/2018 FINDINGS: The heart is enlarged but stable. Stable mild tortuosity and calcification of the thoracic aorta. Slightly prominent pulmonary hila are unchanged. Linear scarring changes in the left mid lung. No infiltrates, edema or effusions. The bony thorax is intact. IMPRESSION: Stable cardiac enlargement. No acute pulmonary findings. Electronically Signed   By: Marijo Sanes M.D.   On: 01/10/2019 08:04   Dg Chest Port 1 View  Result Date: 01/10/2019 CLINICAL DATA:  Status post aortic valve replacement. Assess life-support lines. EXAM: PORTABLE CHEST 1 VIEW COMPARISON:  Chest radiograph January 09, 2019 FINDINGS: Interval aortic valve replacement. Stable cardiomegaly. Calcified aortic arch. Fullness of the RIGHT pulmonary hila most compatible with vascular shadows. Stable LEFT lower lung zone scarring. No pleural effusion or focal consolidation. No pneumothorax. RIGHT internal jugular central venous catheter distal tip projects in distal superior vena cava. Lumbar spine hardware. IMPRESSION: 1. RIGHT internal jugular central venous catheter distal tip projects in distal superior vena cava. No pneumothorax. 2. Interval aortic valve replacement. 3. Stable cardiomegaly.  No acute pulmonary process. Electronically Signed   By: Elon Alas M.D.   On: 01/10/2019 16:56   Disposition   Pt is being discharged home today in good condition.  Follow-up Plans & Appointments    Follow-up Information    Eileen Stanford, PA-C. Go on 01/18/2019.   Specialties:  Cardiology, Radiology Why:  @ 1:30pm, please arrive at least 10 minutes early.  Contact information: [address] [place] [address] [phone]            Discharge Medications   Allergies as of 01/12/2019   No Known Allergies     Medication List    STOP taking these medications   amLODipine 10 MG tablet Commonly known as:  NORVASC   furosemide 20 MG tablet Commonly known as:  LASIX   hydrochlorothiazide 25 MG tablet Commonly known as:  HYDRODIURIL     TAKE these medications   ALPRAZolam 0.5 MG tablet Commonly known as:  XANAX Take 0.5 mg by mouth at bedtime as needed for sleep.   aspirin EC 81 MG tablet Take 81 mg by mouth daily.   beta carotene w/minerals tablet Take 1 tablet by mouth daily.   cimetidine 200 MG tablet Commonly known as:  TAGAMET Take 200 mg by mouth daily as needed (acid reflux/indigestion.).   Cinnamon 500 MG capsule Take 500 mg by mouth daily.   clopidogrel 75 MG tablet Commonly known as:  PLAVIX Take 1 tablet (75 mg total) by mouth daily with breakfast.   [employer] VIT D 5000 HIGH-POTENCY 125 MCG (5000 UT) capsule Generic drug:  Cholecalciferol Take 5,000 Units by mouth daily.   ferrous sulfate 325 (65 FE) MG tablet Take 325 mg by mouth every other day.   Fish Oil 1000 MG Caps Take 1,000 mg by mouth daily.   fluticasone 50 MCG/ACT nasal spray Commonly known as:  Trainer  into both nostrils daily as needed for allergies or rhinitis.   gabapentin 300 MG capsule Commonly known as:  NEURONTIN Take 600 mg by mouth 3 (three) times daily.   glipiZIDE 10 MG tablet Commonly known as:  GLUCOTROL Take 10 mg by mouth daily.   HYDROcodone-acetaminophen 10-325 MG tablet Commonly known as:  NORCO Take 1 tablet by mouth every 8 (eight) hours as needed for moderate pain.   latanoprost 0.005 % ophthalmic solution Commonly known as:  XALATAN Place 1 drop into both eyes at bedtime.   levothyroxine 88 MCG tablet Commonly known as:  SYNTHROID, LEVOTHROID Take 88 mcg by mouth daily.   meloxicam 15 MG tablet Commonly known as:   MOBIC Take 15 mg by mouth daily.   metFORMIN 1000 MG tablet Commonly known as:  GLUCOPHAGE Take 0.5 tablets (500 mg total) by mouth 2 (two) times daily with a meal.   potassium chloride SA 20 MEQ tablet Commonly known as:  K-DUR,KLOR-CON Take 20 mEq by mouth daily.   prenatal multivitamin Tabs tablet Take 1 tablet by mouth daily.   simvastatin 20 MG tablet Commonly known as:  ZOCOR Take 20 mg by mouth at bedtime.   timolol 0.5 % ophthalmic solution Commonly known as:  TIMOPTIC Place 1 drop into both eyes 2 (two) times daily.   trolamine salicylate 10 % cream Commonly known as:  ASPERCREME Apply 1 application topically as needed for muscle pain.   Turmeric 500 MG Tabs Take 500 mg by mouth daily.   TYLENOL 8 HOUR ARTHRITIS PAIN 650 MG CR tablet Generic drug:  acetaminophen Take 1,300 mg by mouth every 8 (eight) hours as needed for pain.   VENTOLIN HFA 108 (90 Base) MCG/ACT inhaler Generic drug:  albuterol Inhale 2 puffs into the lungs every 4 (four) hours as needed for wheezing or shortness of breath.   vitamin B-12 1000 MCG tablet Commonly known as:  CYANOCOBALAMIN Take 1,000 mcg by mouth daily.   vitamin C 500 MG tablet Commonly known as:  ASCORBIC ACID Take 500 mg by mouth daily.   vitamin E 400 UNIT capsule Take 400 Units by mouth daily.           Outstanding Labs/Studies   BMET  Duration of Discharge Encounter   Greater than 30 minutes including physician time.  Mable Fill, PA-C 01/12/2019, 10:45 AM [phone]  I have personally seen and examined this patient. I agree with the assessment and plan as outlined above.  She is doing well 2 days post TAVR. Echo with normally functioning bioprosthetic AVR. Will d/c home today on ASA and Plavix.   Lauree Chandler 01/12/2019 10:51 AM

## 2019-01-12 NOTE — Progress Notes (Addendum)
  [facility]   MULTIDISCIPLINARY HEART VALVE TEAM   Called by Bedelia Person. Apparently Miss Rueckert now cannot bear weight and admits to falling frequently at home. PT feels she needs SNF placement. Discharge cancelled. Will put in a social work consult.   Angelena Form PA-C  [facility]

## 2019-01-12 NOTE — Progress Notes (Signed)
Spoke with Katharine Look RN re d/c CVC order. RN aware.

## 2019-01-12 NOTE — Consult Note (Signed)
Physical Medicine and Rehabilitation Consult   Reason for Consult: Debility Referring Physician: Dr.McAlhany    HPI: [name] [name] is a 73 y.o. female with history of chronic diastolic CHF, COPD, C7EL, chronic back pain, gait disorder with falls, RA, moderate to severe AS who was admitted on 01/10/19 for TVAR by Dr. Angelena Form. Post op with BLE instability with inability to walk. 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 CIR recommended due to functional decline.    Review of Systems  Constitutional: Negative for chills and fever.  HENT: Negative for hearing loss.   Eyes: Negative for blurred vision and double vision.  Respiratory: Positive for shortness of breath and wheezing.   Cardiovascular: Negative for chest pain and palpitations.  Gastrointestinal: Positive for constipation. Negative for heartburn and nausea.  Genitourinary: Negative for dysuria and urgency.  Musculoskeletal: Positive for back pain and falls (bilateral knees tend to givewary).  Neurological: Positive for weakness. Negative for dizziness and headaches.  Psychiatric/Behavioral: The patient does not have insomnia.      Past Medical History:  Diagnosis Date  . Anemia   . Anxiety   . Arthritis   . Blood transfusion without reported diagnosis   . Bronchitis   . CHF (congestive heart failure) ([place])   . CKD (chronic kidney disease) stage 3, GFR 30-59 ml/min (HCC) 08/18/2018  . Depression   . Diabetes mellitus   . Diverticulitis   . Glaucoma   . Hyperlipidemia   . Hypertension   . Hypothyroidism   . Macular degeneration   . Melanoma ([place])    left thigh, 2010  . Osteoarthritis   . Peptic ulcer   . Rheumatoid arthritis(714.0)   . 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 Severe aortic stenosis     Past Surgical History:  Procedure  Laterality Date  . ABDOMINAL HYSTERECTOMY  1988  . APPENDECTOMY    . BACK SURGERY  07/2008   fusion  . BACK SURGERY  02/8100   complicated by post-op anemia requiring transfusion  . BREAST SURGERY     right breast biopsy  . CARDIAC CATHETERIZATION    . CATARACT EXTRACTION    . CHOLECYSTECTOMY  1974  . COLONOSCOPY  06/06/2007   Proximal diminutive rectal polyps cold biopsied/removed, otherwise normal rectum/ Left-sided diverticula, the remainder of the colonic mucosa appeared normal. Path-tubular adnemoa.   . COLONOSCOPY  09/14/2012   BPZ:WCHENI polyps-removed as described  above Colonic diverticulosis. Poor prep. next TCS five years.   . COLONOSCOPY N/A 10/29/2016   Procedure: COLONOSCOPY;  Surgeon: Daneil Dolin, MD;  [facility];  Service: Endoscopy;  Laterality: N/A;  2:00 pm  . ESOPHAGOGASTRODUODENOSCOPY  09/2012   Rourk: gastric ulcer, gastric duodenal erosions, small hh. benign bx  . ESOPHAGOGASTRODUODENOSCOPY N/A 10/29/2016   Procedure: ESOPHAGOGASTRODUODENOSCOPY (EGD);  Surgeon: Daneil Dolin, MD;  [facility];  Service: Endoscopy;  Laterality: N/A;  . EYE SURGERY    . FOOT SURGERY  1994   left   . LUMBAR SPINE SURGERY  08/04/2014   dr Joya Salm  . macular degen    . MALONEY DILATION N/A 10/29/2016   Procedure: Venia Minks DILATION;  Surgeon: Daneil Dolin, MD;  [facility];  Service: Endoscopy;  Laterality: N/A;  . MELANOMA EXCISION  2010  . MULTIPLE EXTRACTIONS WITH ALVEOLOPLASTY N/A 08/17/2018  Procedure: Extraction of tooth #'s 7,8, and 9 with alveoloplasty and gross debridement of remaining teeth;  Surgeon: Lenn Cal, DDS;  Location: [facility];  Service: Oral Surgery;  Laterality: N/A;  . RIGHT/LEFT HEART CATH AND CORONARY ANGIOGRAPHY N/A 08/11/2018   Procedure: RIGHT/LEFT HEART CATH AND CORONARY ANGIOGRAPHY;  Surgeon: Burnell Blanks, MD;  Location: [facility];  Service: Cardiovascular;  Laterality: N/A;  . TEE WITHOUT  CARDIOVERSION N/A 01/10/2019   Procedure: TRANSESOPHAGEAL ECHOCARDIOGRAM (TEE);  Surgeon: Burnell Blanks, MD;  Location: [facility];  Service: Open Heart Surgery;  Laterality: N/A;  . TRANSCATHETER AORTIC VALVE REPLACEMENT, TRANSFEMORAL N/A 01/10/2019   Procedure: TRANSCATHETER AORTIC VALVE REPLACEMENT, TRANSFEMORAL;  Surgeon: Burnell Blanks, MD;  Location: [facility];  Service: Open Heart Surgery;  Laterality: N/A;  . YAG LASER APPLICATION     OS    Family History  Problem Relation Age of Onset  . Colon cancer Brother        diagnosed at age 51  . Diabetes Brother   . Hypertension Mother   . Heart failure Mother   . Diabetes Mother   . Glaucoma Mother   . Hypertension Other   . Other Other   . Heart failure Father        passed away of MI in his 52s  . Diabetes Sister   . Hypertension Sister   . Breast cancer Sister   . Heart attack Brother   . Diabetes Sister   . Hypertension Sister   . Other Sister        tumor  . Hypertension Sister   . Hypertension Daughter   . Bipolar disorder Daughter   . Anesthesia problems Neg Hx   . Amblyopia Neg Hx   . Blindness Neg Hx   . Cataracts Neg Hx   . Macular degeneration Neg Hx   . Retinal detachment Neg Hx   . Strabismus Neg Hx   . Retinitis pigmentosa Neg Hx     Social History:   Lives with family. Independent with walker PTA but sedentary. She reports that she has never smoked. She has never used smokeless tobacco. She reports that she does not drink alcohol or use drugs.    Allergies: No Known Allergies    Medications Prior to Admission  Medication Sig Dispense Refill  . acetaminophen (TYLENOL 8 HOUR ARTHRITIS PAIN) 650 MG CR tablet Take 1,300 mg by mouth every 8 (eight) hours as needed for pain.    Marland Kitchen ALPRAZolam (XANAX) 0.5 MG tablet Take 0.5 mg by mouth at bedtime as needed for sleep.     Marland Kitchen amLODipine (NORVASC) 10 MG tablet Take 1 tablet (10 mg total) by mouth at bedtime. 90 tablet 3  .  aspirin EC 81 MG tablet Take 81 mg by mouth daily.     . beta carotene w/minerals (OCUVITE) tablet Take 1 tablet by mouth daily.    . Cholecalciferol ([employer]S VIT D 5000 HIGH-POTENCY) 5000 UNITS capsule Take 5,000 Units by mouth daily.    . Cinnamon 500 MG capsule Take 500 mg by mouth daily.    . ferrous sulfate 325 (65 FE) MG tablet Take 325 mg by mouth every other day.     . fluticasone (FLONASE) 50 MCG/ACT nasal spray Place into both nostrils daily as needed for allergies or rhinitis.    . furosemide (LASIX) 20 MG tablet Take 20 mg by mouth daily.    Marland Kitchen gabapentin (NEURONTIN) 300 MG capsule  Take 600 mg by mouth 3 (three) times daily.     Marland Kitchen glipiZIDE (GLUCOTROL) 10 MG tablet Take 10 mg by mouth daily.     . hydrochlorothiazide (HYDRODIURIL) 25 MG tablet Take 25 mg by mouth daily.    Marland Kitchen HYDROcodone-acetaminophen (NORCO) 10-325 MG tablet Take 1 tablet by mouth every 8 (eight) hours as needed for moderate pain.     Marland Kitchen latanoprost (XALATAN) 0.005 % ophthalmic solution Place 1 drop into both eyes at bedtime.     Marland Kitchen levothyroxine (SYNTHROID, LEVOTHROID) 88 MCG tablet Take 88 mcg by mouth daily.    . meloxicam (MOBIC) 15 MG tablet Take 15 mg by mouth daily.    . metFORMIN (GLUCOPHAGE) 1000 MG tablet Take 0.5 tablets (500 mg total) by mouth 2 (two) times daily with a meal. 45 tablet 3  . Omega-3 Fatty Acids (FISH OIL) 1000 MG CAPS Take 1,000 mg by mouth daily.     . potassium chloride SA (K-DUR,KLOR-CON) 20 MEQ tablet Take 20 mEq by mouth daily.     . Prenatal Vit-Fe Fumarate-FA (PRENATAL MULTIVITAMIN) TABS Take 1 tablet by mouth daily.    . simvastatin (ZOCOR) 20 MG tablet Take 20 mg by mouth at bedtime.      . timolol (TIMOPTIC) 0.5 % ophthalmic solution Place 1 drop into both eyes 2 (two) times daily.     Marland Kitchen trolamine salicylate (ASPERCREME) 10 % cream Apply 1 application topically as needed for muscle pain.    . Turmeric 500 MG TABS Take 500 mg by mouth daily.    . VENTOLIN HFA 108 (90 Base) MCG/ACT  inhaler Inhale 2 puffs into the lungs every 4 (four) hours as needed for wheezing or shortness of breath.   0  . vitamin B-12 (CYANOCOBALAMIN) 1000 MCG tablet Take 1,000 mcg by mouth daily.    . vitamin C (ASCORBIC ACID) 500 MG tablet Take 500 mg by mouth daily.    . vitamin E 400 UNIT capsule Take 400 Units by mouth daily.    . cimetidine (TAGAMET) 200 MG tablet Take 200 mg by mouth daily as needed (acid reflux/indigestion.).       Home: Home Living Family/patient expects to be discharged to:: Private residence Living Arrangements: Children(daughter) Available Help at Discharge: Family, Available 24 hours/day Type of Home: House Home Access: Stairs to enter Technical brewer of Steps: 2 Entrance Stairs-Rails: None Home Layout: One level Bathroom Shower/Tub: Multimedia programmer: [place]: Environmental consultant - 4 wheels, Cane - quad, Radio producer - single point, Electronics engineer Comments: Reports history of falls once per week   Functional History: Prior Function Level of Independence: Independent with assistive device(s) Comments: uses Rollator Functional Status:  Mobility: Bed Mobility Overal bed mobility: Needs Assistance Bed Mobility: Supine to Sit Sit to supine: Min guard Transfers Overall transfer level: Needs assistance Equipment used: Rolling walker (2 wheeled) Transfers: Sit to/from Stand, [employer] Transfers Sit to Stand: Mod assist, +2 physical assistance, Max assist Stand pivot transfers: Mod assist, +2 physical assistance General transfer comment: Requiring modA + 2 to stand at edge of bed. Once standing pt with jerking of both legs, causing bilateral knee buckle and maxA to assist pt back onto bed to prevent fall. Requiring modA + 2 to stand pivot without walker over to chair with bilateral knee block Ambulation/Gait General Gait Details: deferred    ADL:    Cognition: Cognition Overall Cognitive Status: Impaired/Different from  baseline Orientation Level: Oriented X4, Oriented to person, Oriented to place,  Oriented to time Cognition Arousal/Alertness: Awake/alert Behavior During Therapy: WFL for tasks assessed/performed Overall Cognitive Status: Impaired/Different from baseline Area of Impairment: Safety/judgement Safety/Judgement: Decreased awareness of deficits   Blood pressure (!) 134/50, pulse 64, temperature 98.7 F (37.1 C), temperature source Oral, resp. rate 13, height 4\' 9"  (1.448 m), weight 65.9 kg, SpO2 96 %. Physical Exam  Nursing note and vitals reviewed. Constitutional: She is oriented to person, place, and time. She appears well-developed and well-nourished. She is easily aroused.  HENT:  Head: Normocephalic and atraumatic.  Eyes: Pupils are equal, round, and reactive to light. Conjunctivae are normal.  Cardiovascular: Normal rate and regular rhythm.  Respiratory: Effort normal and breath sounds normal. No respiratory distress. She has no wheezes.  GI: Soft. Bowel sounds are normal. She exhibits distension. There is no abdominal tenderness.  Neurological: She is alert, oriented to person, place, and time and easily aroused.  Mildly disoriented. Able to follow motor commands with minimal cues and encouragement.  4/5 bilateral deltoid bicep tricep grip hip flexor knee extensor ankle dorsiflexor Negative straight leg raising Sensation intact light touch in bilateral upper and lower limbs    Skin: Skin is warm and dry.  Psychiatric: Her affect is blunt. Her speech is delayed and slurred. She is slowed.    Results for orders placed or performed during the hospital encounter of 01/10/19 (from the past 24 hour(s))  Glucose, capillary     Status: Abnormal   Collection Time: 01/11/19  4:16 PM  Result Value Ref Range   Glucose-Capillary 196 (H) 70 - 99 mg/dL   Comment 1 Notify RN   Glucose, capillary     Status: Abnormal   Collection Time: 01/11/19  9:15 PM  Result Value Ref Range    Glucose-Capillary 193 (H) 70 - 99 mg/dL  CBC     Status: Abnormal   Collection Time: 01/12/19  4:56 AM  Result Value Ref Range   WBC 8.5 4.0 - 10.5 K/uL   RBC 3.21 (L) 3.87 - 5.11 MIL/uL   Hemoglobin 9.2 (L) 12.0 - 15.0 g/dL   HCT 28.6 (L) 36.0 - 46.0 %   MCV 89.1 80.0 - 100.0 fL   MCH 28.7 26.0 - 34.0 pg   MCHC 32.2 30.0 - 36.0 g/dL   RDW 14.8 11.5 - 15.5 %   Platelets 160 150 - 400 K/uL   nRBC 0.0 0.0 - 0.2 %  Basic metabolic panel     Status: Abnormal   Collection Time: 01/12/19  4:56 AM  Result Value Ref Range   Sodium 137 135 - 145 mmol/L   Potassium 3.5 3.5 - 5.1 mmol/L   Chloride 98 98 - 111 mmol/L   CO2 30 22 - 32 mmol/L   Glucose, Bld 130 (H) 70 - 99 mg/dL   BUN 29 (H) 8 - 23 mg/dL   Creatinine, Ser 2.08 (H) 0.44 - 1.00 mg/dL   Calcium 7.9 (L) 8.9 - 10.3 mg/dL   GFR calc non Af Amer 23 (L) >60 mL/min   GFR calc Af Amer 27 (L) >60 mL/min   Anion gap 9 5 - 15  Glucose, capillary     Status: Abnormal   Collection Time: 01/12/19  6:02 AM  Result Value Ref Range   Glucose-Capillary 125 (H) 70 - 99 mg/dL  Glucose, capillary     Status: Abnormal   Collection Time: 01/12/19 11:39 AM  Result Value Ref Range   Glucose-Capillary 145 (H) 70 - 99 mg/dL   Comment 1  Notify RN    Comment 2 Document in Chart    Dg Chest Port 1 View  Result Date: 01/10/2019 CLINICAL DATA:  Status post aortic valve replacement. Assess life-support lines. EXAM: PORTABLE CHEST 1 VIEW COMPARISON:  Chest radiograph January 09, 2019 FINDINGS: Interval aortic valve replacement. Stable cardiomegaly. Calcified aortic arch. Fullness of the RIGHT pulmonary hila most compatible with vascular shadows. Stable LEFT lower lung zone scarring. No pleural effusion or focal consolidation. No pneumothorax. RIGHT internal jugular central venous catheter distal tip projects in distal superior vena cava. Lumbar spine hardware. IMPRESSION: 1. RIGHT internal jugular central venous catheter distal tip projects in distal  superior vena cava. No pneumothorax. 2. Interval aortic valve replacement. 3. Stable cardiomegaly.  No acute pulmonary process. Electronically Signed   By: Elon Alas M.D.   On: 01/10/2019 16:56   Assessment/Plan: Diagnosis: Deconditioning after TAVR 1. Does the need for close, 24 hr/day medical supervision in concert with the patient's rehab needs make it unreasonable for this patient to be served in a less intensive setting? Yes 2. Co-Morbidities requiring supervision/potential complications: Anemia, cute exacerbation of chronic kidney disease 3. Due to bladder management, bowel management, safety, skin/wound care, disease management, medication administration, pain management and patient education, does the patient require 24 hr/day rehab nursing? Yes 4. Does the patient require coordinated care of a physician, rehab nurse, PT (1-2 hrs/day, 5 days/week), OT (1-2 hrs/day, 5 days/week) and SLP (.5-1 hrs/day, 5 days/week) to address physical and functional deficits in the context of the above medical diagnosis(es)? Yes Addressing deficits in the following areas: balance, endurance, locomotion, strength, transferring, bowel/bladder control, bathing, dressing, feeding, grooming, toileting, cognition and psychosocial support 5. Can the patient actively participate in an intensive therapy program of at least 3 hrs of therapy per day at least 5 days per week? Yes 6. The potential for patient to make measurable gains while on inpatient rehab is good 7. Anticipated functional outcomes upon discharge from inpatient rehab are modified independent  with PT, modified independent with OT, modified independent with SLP. 8. Estimated rehab length of stay to reach the above functional goals is: 10-14d 9. Anticipated D/C setting: Home 10. Anticipated post D/C treatments: [place] therapy 11. Overall Rehab/Functional Prognosis: good  RECOMMENDATIONS: This patient's condition is appropriate for continued  rehabilitative care in the following setting: CIR Patient has agreed to participate in recommended program. Yes Note that insurance prior authorization may be required for reimbursement for recommended care.  Comment:  "I have personally performed a face to face diagnostic evaluation of this patient.  Additionally, I have reviewed and concur with the physician assistant's documentation above." Charlett Blake M.D. [facility] FAAPM&R (Sports Med, Neuromuscular Med) Diplomate [place] Board of [place], PA-C 01/12/2019

## 2019-01-12 NOTE — Evaluation (Addendum)
Physical Therapy Evaluation Patient Details Name: [name] [name] MRN: [id number] DOB: 1946-03-15 Today's Date: 01/12/2019   History of Present Illness  Pt is a 73 y.o. F with significant PMH of CHF, DM2, CKD, RA who is now s/p transcatheter aortic valve replacement.    Clinical Impression  Patient admitted with above diagnosis. Prior to admission, pt independent with ADL's and uses Rollator for mobility; pt endorses history of recurrent falling. On PT evaluation, patient presenting with decreased functional mobility secondary to poor static/dynamic standing balance and decreased awareness of deficits. Pt currently requires significant assistance to stand and is unable to walk. Once standing, pt displays seemingly myoclonic jerking of bilateral lower extremities, causing knee buckling (although does not seem to be a strength deficit). Pt daughter states this is new and has not happened before. Recommend post acute rehab to maximize functional independence and decrease caregiver burden. Pt presents as high fall risk based on deficits listed above and history of falls.     Follow Up Recommendations CIR;Supervision/Assistance - 24 hour    Equipment Recommendations  None recommended by PT    Recommendations for Other Services Rehab consult;OT consult     Precautions / Restrictions Precautions Precautions: Fall Restrictions Weight Bearing Restrictions: No      Mobility  Bed Mobility Overal bed mobility: Needs Assistance Bed Mobility: Supine to Sit       Sit to supine: Min guard      Transfers Overall transfer level: Needs assistance Equipment used: Rolling walker (2 wheeled) Transfers: Sit to/from [facility] Sit to Stand: Mod assist;+2 physical assistance;Max assist Stand pivot transfers: Mod assist;+2 physical assistance       General transfer comment: Requiring modA + 2 to stand at edge of bed. Once standing pt with jerking of both legs, causing bilateral  knee buckle and maxA to control pt back onto bed to prevent fall. Requiring modA + 2 to stand pivot without walker over to chair with bilateral knee block  Ambulation/Gait             General Gait Details: deferred  Stairs            Wheelchair Mobility    Modified Rankin (Stroke Patients Only)       Balance Overall balance assessment: Needs assistance Sitting-balance support: Feet supported Sitting balance-Leahy Scale: Fair     Standing balance support: Bilateral upper extremity supported Standing balance-Leahy Scale: Good                               Pertinent Vitals/Pain Pain Assessment: No/denies pain    Home Living Family/patient expects to be discharged to:: Private residence Living Arrangements: Children(daughter) Available Help at Discharge: Family;Available 24 hours/day Type of Home: House Home Access: Stairs to enter Entrance Stairs-Rails: None Entrance Stairs-Number of Steps: 2 Home Layout: One level Home Equipment: Walker - 4 wheels;Cane - quad;Cane - single point;Shower seat Additional Comments: Reports history of falls once per week     Prior Function Level of Independence: Independent with assistive device(s)         Comments: uses Rollator     Hand Dominance   Dominant Hand: Right    Extremity/Trunk Assessment   Upper Extremity Assessment Upper Extremity Assessment: Overall WFL for tasks assessed    Lower Extremity Assessment Lower Extremity Assessment: Generalized weakness;RLE deficits/detail;LLE deficits/detail RLE Coordination: decreased gross motor LLE Coordination: decreased gross motor       Communication  Communication: No difficulties  Cognition Arousal/Alertness: Awake/alert Behavior During Therapy: WFL for tasks assessed/performed Overall Cognitive Status: Impaired/Different from baseline Area of Impairment: Safety/judgement                         Safety/Judgement: Decreased  awareness of deficits            General Comments  VSS on RA    Exercises     Assessment/Plan    PT Assessment Patient needs continued PT services  PT Problem List Decreased strength;Decreased activity tolerance;Decreased balance;Decreased mobility;Decreased coordination;Decreased safety awareness       PT Treatment Interventions DME instruction;Gait training;Stair training;Therapeutic activities;Functional mobility training;Therapeutic exercise;Balance training;Patient/family education    PT Goals (Current goals can be found in the Care Plan section)  Acute Rehab PT Goals Patient Stated Goal: "I was excited to go home." PT Goal Formulation: With patient Time For Goal Achievement: 01/26/19 Potential to Achieve Goals: Good    Frequency Min 3X/week   Barriers to discharge        Co-evaluation               AM-PAC PT "6 Clicks" Mobility  Outcome Measure Help needed turning from your back to your side while in a flat bed without using bedrails?: None Help needed moving from lying on your back to sitting on the side of a flat bed without using bedrails?: A Little Help needed moving to and from a bed to a chair (including a wheelchair)?: A Lot Help needed standing up from a chair using your arms (e.g., wheelchair or bedside chair)?: A Lot Help needed to walk in hospital room?: A Lot Help needed climbing 3-5 steps with a railing? : Total 6 Click Score: 14    End of Session Equipment Utilized During Treatment: Gait belt Activity Tolerance: Patient tolerated treatment well Patient left: in chair;with call bell/phone within reach;with chair alarm set;with nursing/sitter in room Nurse Communication: Mobility status PT Visit Diagnosis: Unsteadiness on feet (R26.81);History of falling (Z91.81);Difficulty in walking, not elsewhere classified (R26.2)    Time: 6812-7517 PT Time Calculation (min) (ACUTE ONLY): 17 min   Charges:   PT Evaluation $PT Eval Moderate  Complexity: 1 Mod         Ellamae Sia, [place], DPT Acute Rehabilitation Services Pager [phone] Office [phone]   Willy Eddy 01/12/2019, 1:06 PM

## 2019-01-13 ENCOUNTER — Inpatient Hospital Stay (HOSPITAL_COMMUNITY): Payer: Medicare Other

## 2019-01-13 DIAGNOSIS — R278 Other lack of coordination: Secondary | ICD-10-CM

## 2019-01-13 LAB — GLUCOSE, CAPILLARY
GLUCOSE-CAPILLARY: 205 mg/dL — AB (ref 70–99)
GLUCOSE-CAPILLARY: 172 mg/dL — AB (ref 70–99)
Glucose-Capillary: 175 mg/dL — ABNORMAL HIGH (ref 70–99)
Glucose-Capillary: 328 mg/dL — ABNORMAL HIGH (ref 70–99)

## 2019-01-13 LAB — CBC
HCT: 30.1 % — ABNORMAL LOW (ref 36.0–46.0)
Hemoglobin: 9.9 g/dL — ABNORMAL LOW (ref 12.0–15.0)
MCH: 28.6 pg (ref 26.0–34.0)
MCHC: 32.9 g/dL (ref 30.0–36.0)
MCV: 87 fL (ref 80.0–100.0)
Platelets: 168 10*3/uL (ref 150–400)
RBC: 3.46 MIL/uL — ABNORMAL LOW (ref 3.87–5.11)
RDW: 15 % (ref 11.5–15.5)
WBC: 10.1 10*3/uL (ref 4.0–10.5)
nRBC: 0 % (ref 0.0–0.2)

## 2019-01-13 LAB — BASIC METABOLIC PANEL
Anion gap: 11 (ref 5–15)
BUN: 25 mg/dL — ABNORMAL HIGH (ref 8–23)
CO2: 29 mmol/L (ref 22–32)
Calcium: 8.6 mg/dL — ABNORMAL LOW (ref 8.9–10.3)
Chloride: 99 mmol/L (ref 98–111)
Creatinine, Ser: 1.55 mg/dL — ABNORMAL HIGH (ref 0.44–1.00)
GFR calc Af Amer: 38 mL/min — ABNORMAL LOW (ref >60)
GFR calc non Af Amer: 33 mL/min — ABNORMAL LOW (ref >60)
Glucose, Bld: 190 mg/dL — ABNORMAL HIGH (ref 70–99)
Potassium: 3 mmol/L — ABNORMAL LOW (ref 3.5–5.1)
SODIUM: 139 mmol/L (ref 135–145)

## 2019-01-13 LAB — AMMONIA: Ammonia: 45 umol/L — ABNORMAL HIGH (ref 9–35)

## 2019-01-13 MED ORDER — POTASSIUM CHLORIDE CRYS ER 20 MEQ PO TBCR
80.0000 meq | EXTENDED_RELEASE_TABLET | Freq: Once | ORAL | Status: AC
  Administered 2019-01-13: 80 meq via ORAL
  Filled 2019-01-13: qty 4

## 2019-01-13 MED ORDER — GABAPENTIN 100 MG PO CAPS
200.0000 mg | ORAL_CAPSULE | Freq: Two times a day (BID) | ORAL | Status: DC
  Administered 2019-01-14 – 2019-01-15 (×3): 200 mg via ORAL
  Filled 2019-01-13 (×3): qty 2

## 2019-01-13 MED ORDER — AMLODIPINE BESYLATE 10 MG PO TABS
10.0000 mg | ORAL_TABLET | Freq: Every day | ORAL | Status: DC
  Administered 2019-01-13 – 2019-01-15 (×3): 10 mg via ORAL
  Filled 2019-01-13 (×3): qty 1

## 2019-01-13 MED ORDER — STROKE: EARLY STAGES OF RECOVERY BOOK
Freq: Once | Status: DC
  Filled 2019-01-13: qty 1

## 2019-01-13 MED ORDER — CARVEDILOL 6.25 MG PO TABS
6.2500 mg | ORAL_TABLET | Freq: Two times a day (BID) | ORAL | Status: DC
  Administered 2019-01-13 – 2019-01-16 (×7): 6.25 mg via ORAL
  Filled 2019-01-13 (×7): qty 1

## 2019-01-13 MED ORDER — POTASSIUM CHLORIDE CRYS ER 20 MEQ PO TBCR: 40.0000 meq | EXTENDED_RELEASE_TABLET | Freq: Once | ORAL | Status: DC

## 2019-01-13 MED ORDER — IOPAMIDOL (ISOVUE-370) INJECTION 76%
INTRAVENOUS | Status: AC
  Filled 2019-01-13: qty 100

## 2019-01-13 MED ORDER — IOPAMIDOL (ISOVUE-370) INJECTION 76%
100.0000 mL | Freq: Once | INTRAVENOUS | Status: AC | PRN
  Administered 2019-01-13: 100 mL via INTRAVENOUS

## 2019-01-13 NOTE — H&P (Signed)
Physical Medicine and Rehabilitation Admission H&P    CC: Stroke with functional deficits.    HPI: [name] [name] is a 73 year old female with history of chronic diastolic CHF, COPD, T7DU, chronic LBP, gait disorder with instability, moderate to severe left AS who was admitted on 01/10/2023 TVAR by Dr. Caryl Bis.  Postop noted to have issues with bilateral lower extremity weakness with myoclonic jerks, BUE dysmetria and inability to walk.  Also noted to have lethargy with asterixis therefore neurology consulted for input.  Dr. Leonel Ramsay consulted and felt that patient with gabapentin toxicity due to increase in dose in setting of acute on chronic renal failure which was likely contributing to the symptoms but recommended neurological work up. MRI brain revealing multiple small areas of acute infarct in bilateral cerebral hemispheres c/w cardioembolism. CTA head/neck was negative for emergent large vessel occlusion and showed moderate to server R-ICA and moderate L-ICA stenosis.    Neurology felt that strokes were related to [facility] --no further work up indicated and recommended following up with [facility] on outpatient basis. Asterixis and lethargy has resolved. She continue to have weakness with poor safety awareness and impulsivity, mild cognitive impairments,   that is affecting mobility and ADL tasks. CIR recommended due to functional decline.    Review of Systems  Constitutional: Negative for chills and fever.  HENT: Negative for hearing loss and tinnitus.   Eyes: Negative for blurred vision and double vision.  Respiratory: Positive for shortness of breath and wheezing. Negative for cough.   Cardiovascular: Negative for chest pain and palpitations.  Gastrointestinal: Negative for abdominal pain, constipation, heartburn and nausea.  Genitourinary: Negative for dysuria.  Musculoskeletal: Positive for back pain and falls (due to BLE instability. ).  Skin: Negative for itching and rash.    Neurological: Positive for weakness. Negative for dizziness and headaches.  Psychiatric/Behavioral: The patient is not nervous/anxious.     Past Medical History:  Diagnosis Date  . Anemia   . Anxiety   . Arthritis   . Blood transfusion without reported diagnosis   . Bronchitis   . CHF (congestive heart failure) ([place])   . CKD (chronic kidney disease) stage 3, GFR 30-59 ml/min (HCC) 08/18/2018  . Depression   . Diabetes mellitus   . Diverticulitis   . Glaucoma   . Hyperlipidemia   . Hypertension   . Hypothyroidism   . Macular degeneration   . Melanoma ([place])    left thigh, 2010  . Osteoarthritis   . Peptic ulcer   . Rheumatoid arthritis(714.0)   . 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 Severe aortic stenosis     Past Surgical History:  Procedure Laterality Date  . ABDOMINAL HYSTERECTOMY  1988  . APPENDECTOMY    . BACK SURGERY  07/2008   fusion  . BACK SURGERY  01/253   complicated by post-op anemia requiring transfusion  . BREAST SURGERY     right breast biopsy  . CARDIAC CATHETERIZATION    . CATARACT EXTRACTION    . CHOLECYSTECTOMY  1974  . COLONOSCOPY  06/06/2007   Proximal diminutive rectal polyps cold biopsied/removed, otherwise normal rectum/ Left-sided diverticula, the remainder of the colonic mucosa appeared normal. Path-tubular adnemoa.   . COLONOSCOPY  09/14/2012   YHC:WCBJSE polyps-removed as described  above Colonic diverticulosis. Poor prep. next TCS five years.   . COLONOSCOPY N/A 10/29/2016  Procedure: COLONOSCOPY;  Surgeon: Daneil Dolin, MD;  [facility];  Service: Endoscopy;  Laterality: N/A;  2:00 pm  . ESOPHAGOGASTRODUODENOSCOPY  09/2012   Rourk: gastric ulcer, gastric duodenal erosions, small hh. benign bx  . ESOPHAGOGASTRODUODENOSCOPY N/A 10/29/2016   Procedure: ESOPHAGOGASTRODUODENOSCOPY (EGD);  Surgeon:  Daneil Dolin, MD;  [facility];  Service: Endoscopy;  Laterality: N/A;  . EYE SURGERY    . FOOT SURGERY  1994   left   . LUMBAR SPINE SURGERY  08/04/2014   dr Joya Salm  . macular degen    . MALONEY DILATION N/A 10/29/2016   Procedure: Venia Minks DILATION;  Surgeon: Daneil Dolin, MD;  [facility];  Service: Endoscopy;  Laterality: N/A;  . MELANOMA EXCISION  2010  . MULTIPLE EXTRACTIONS WITH ALVEOLOPLASTY N/A 08/17/2018   Procedure: Extraction of tooth #'s 7,8, and 9 with alveoloplasty and gross debridement of remaining teeth;  Surgeon: Lenn Cal, DDS;  Location: [place];  Service: Oral Surgery;  Laterality: N/A;  . RIGHT/LEFT HEART CATH AND CORONARY ANGIOGRAPHY N/A 08/11/2018   Procedure: RIGHT/LEFT HEART CATH AND CORONARY ANGIOGRAPHY;  Surgeon: Burnell Blanks, MD;  Location: [place] [facility];  Service: Cardiovascular;  Laterality: N/A;  . TEE WITHOUT CARDIOVERSION N/A 01/10/2019   Procedure: TRANSESOPHAGEAL ECHOCARDIOGRAM (TEE);  Surgeon: Burnell Blanks, MD;  Location: [place] [facility];  Service: Open Heart Surgery;  Laterality: N/A;  . TRANSCATHETER AORTIC VALVE REPLACEMENT, TRANSFEMORAL N/A 01/10/2019   Procedure: TRANSCATHETER AORTIC VALVE REPLACEMENT, TRANSFEMORAL;  Surgeon: Burnell Blanks, MD;  Location: [place] [facility];  Service: Open Heart Surgery;  Laterality: N/A;  . TRANSCATHETER AORTIC VALVE REPLACEMENT, TRANSFEMORAL  12/2018  . YAG LASER APPLICATION     OS    Family History  Problem Relation Age of Onset  . Colon cancer Brother        diagnosed at age 76  . Diabetes Brother   . Hypertension Mother   . Heart failure Mother   . Diabetes Mother   . Glaucoma Mother   . Hypertension Other   . Other Other   . Heart failure Father        passed away of MI in his 32s  . Diabetes Sister   . Hypertension Sister   . Breast cancer Sister   . Heart attack Brother   . Diabetes Sister   . Hypertension Sister   . Other  Sister        tumor  . Hypertension Sister   . Hypertension Daughter   . Bipolar disorder Daughter   . Anesthesia problems Neg Hx   . Amblyopia Neg Hx   . Blindness Neg Hx   . Cataracts Neg Hx   . Macular degeneration Neg Hx   . Retinal detachment Neg Hx   . Strabismus Neg Hx   . Retinitis pigmentosa Neg Hx     Social History:  Lives with family. Independent with rollator but sedentary. She reports that she has never smoked. She has never used smokeless tobacco. She reports that she does not drink alcohol or use drugs.    Allergies: No Known Allergies    Medications Prior to Admission  Medication Sig Dispense Refill  . acetaminophen (TYLENOL 8 HOUR ARTHRITIS PAIN) 650 MG CR tablet Take 1,300 mg by mouth every 8 (eight) hours as needed for pain.    Marland Kitchen ALPRAZolam (XANAX) 0.5 MG tablet Take 0.5 mg by mouth at bedtime as needed for sleep.     Marland Kitchen  amLODipine (NORVASC) 10 MG tablet Take 1 tablet (10 mg total) by mouth at bedtime. 90 tablet 3  . aspirin EC 81 MG tablet Take 81 mg by mouth daily.     . beta carotene w/minerals (OCUVITE) tablet Take 1 tablet by mouth daily.    . Cholecalciferol (CVS VIT D 5000 HIGH-POTENCY) 5000 UNITS capsule Take 5,000 Units by mouth daily.    . Cinnamon 500 MG capsule Take 500 mg by mouth daily.    . ferrous sulfate 325 (65 FE) MG tablet Take 325 mg by mouth every other day.     . fluticasone (FLONASE) 50 MCG/ACT nasal spray Place into both nostrils daily as needed for allergies or rhinitis.    . furosemide (LASIX) 20 MG tablet Take 20 mg by mouth daily.    Marland Kitchen glipiZIDE (GLUCOTROL) 10 MG tablet Take 10 mg by mouth daily.     . hydrochlorothiazide (HYDRODIURIL) 25 MG tablet Take 25 mg by mouth daily.    Marland Kitchen HYDROcodone-acetaminophen (NORCO) 10-325 MG tablet Take 1 tablet by mouth every 8 (eight) hours as needed for moderate pain.     Marland Kitchen latanoprost (XALATAN) 0.005 % ophthalmic solution Place 1 drop into both eyes at bedtime.     Marland Kitchen levothyroxine (SYNTHROID,  LEVOTHROID) 88 MCG tablet Take 88 mcg by mouth daily.    . meloxicam (MOBIC) 15 MG tablet Take 15 mg by mouth daily.    . metFORMIN (GLUCOPHAGE) 1000 MG tablet Take 0.5 tablets (500 mg total) by mouth 2 (two) times daily with a meal. 45 tablet 3  . Omega-3 Fatty Acids (FISH OIL) 1000 MG CAPS Take 1,000 mg by mouth daily.     . Prenatal Vit-Fe Fumarate-FA (PRENATAL MULTIVITAMIN) TABS Take 1 tablet by mouth daily.    . simvastatin (ZOCOR) 20 MG tablet Take 20 mg by mouth at bedtime.      . timolol (TIMOPTIC) 0.5 % ophthalmic solution Place 1 drop into both eyes 2 (two) times daily.     Marland Kitchen trolamine salicylate (ASPERCREME) 10 % cream Apply 1 application topically as needed for muscle pain.    . Turmeric 500 MG TABS Take 500 mg by mouth daily.    . VENTOLIN HFA 108 (90 Base) MCG/ACT inhaler Inhale 2 puffs into the lungs every 4 (four) hours as needed for wheezing or shortness of breath.   0  . vitamin B-12 (CYANOCOBALAMIN) 1000 MCG tablet Take 1,000 mcg by mouth daily.    . vitamin C (ASCORBIC ACID) 500 MG tablet Take 500 mg by mouth daily.    . vitamin E 400 UNIT capsule Take 400 Units by mouth daily.    . [DISCONTINUED] gabapentin (NEURONTIN) 300 MG capsule Take 600 mg by mouth 3 (three) times daily.     . [DISCONTINUED] potassium chloride SA (K-DUR,KLOR-CON) 20 MEQ tablet Take 20 mEq by mouth daily.     . cimetidine (TAGAMET) 200 MG tablet Take 200 mg by mouth daily as needed (acid reflux/indigestion.).       Drug Regimen Review  Drug regimen was reviewed and remains appropriate with no significant issues identified  Home: Home Living Family/patient expects to be discharged to:: Private residence Living Arrangements: Children(daughter) Available Help at Discharge: Family, Available 24 hours/day Type of Home: House Home Access: Stairs to enter [employer] of Steps: 2 Entrance Stairs-Rails: None Home Layout: One level Bathroom Shower/Tub: Multimedia programmer:  Standard Home Equipment: Environmental consultant - 4 wheels, Cane - quad, [place] - single point, Electronics engineer  Comments: Reports history of falls once per week    Functional History: Prior Function Level of Independence: Independent with assistive device(s) Comments: uses Rollator  Functional Status:  Mobility: Bed Mobility Overal bed mobility: Needs Assistance Bed Mobility: Supine to Sit Supine to sit: Min guard, HOB elevated Sit to supine: Min guard General bed mobility comments: Min Guard A for safety Transfers Overall transfer level: Needs assistance Equipment used: Rolling walker (2 wheeled) Transfers: Sit to/from Stand Sit to Stand: Min assist Stand pivot transfers: Mod assist, +2 physical assistance General transfer comment: Min assist to power up to standing with use of RW Ambulation/Gait Ambulation/Gait assistance: Min assist Gait Distance (Feet): 200 Feet Assistive device: Rolling walker (2 wheeled), None Gait Pattern/deviations: Step-through pattern, Decreased stride length, Drifts right/left General Gait Details: patient with poor ability to navigate safely. Noted bilateral LE buckling with ambulation, 2 seated rest breaks required due to decreased activity tolerance. Ambulated on 3 liters supplemental O2 with saturations to mid 80s and increased WOB with audible wheezing noted Gait velocity: decreased Gait velocity interpretation: <1.8 ft/sec, indicate of risk for recurrent falls    ADL: ADL Overall ADL's : Needs assistance/impaired Eating/Feeding: Supervision/ safety, Set up Grooming: Brushing hair, Wash/dry hands, Wash/dry face, Oral care, Sitting, Standing, Set up, Min guard Grooming Details (indicate cue type and reason): stood to brush teeth x 2 minutes Upper Body Bathing: Minimal assistance, Moderate assistance, Sitting Upper Body Bathing Details (indicate cue type and reason): PATIENT NEEDS OCCASIONALLY MIN GUARD FOR SITTING BALANCE Lower Body Bathing: Moderate  assistance, Maximal assistance, +2 for physical assistance, +2 for safety/equipment, Sit to/from stand Lower Body Bathing Details (indicate cue type and reason): PATIENT KNEES ARE BUCKELING IN STANDING.  Upper Body Dressing : Set up, Sitting Upper Body Dressing Details (indicate cue type and reason): (PATIENT WITH JERKY MOVEMENTS) Lower Body Dressing: Set up, Sitting/lateral leans Lower Body Dressing Details (indicate cue type and reason): slip on shoes Toilet Transfer: Minimal assistance, Ambulation, RW, Regular Toilet Toileting- Clothing Manipulation and Hygiene: Minimal assistance, Sit to/from stand Toileting - Clothing Manipulation Details (indicate cue type and reason): assist to manage mesh panties Functional mobility during ADLs: Minimal assistance, +2 for safety/equipment, Cueing for safety, Rolling walker General ADL Comments: pt with vomiting at end of session, RN notified   Cognition: Cognition Overall Cognitive Status: Impaired/Different from baseline Arousal/Alertness: Awake/alert Orientation Level: Oriented X4 Attention: Selective, Alternating Selective Attention: Impaired Selective Attention Impairment: Verbal basic, Functional basic("i need to turn that tv off") Alternating Attention: Impaired Alternating Attention Impairment: Functional basic(difficulty returning to task after interruptions) Memory: Impaired Memory Impairment: Storage deficit, Decreased short term memory(slow processing, decreased working memory) Decreased Short Term Memory: Verbal basic, Functional basic Awareness: Impaired Awareness Impairment: Other (comment)(splinter skills; pt initially denies deficits) Problem Solving: Impaired(slow processing) Executive Function: Organizing(clock drawing with poor spacing, numbers placed outside circ) Behaviors: Other (comment)(flat affect) Safety/Judgment: Impaired(pt has fallen with admission) Cognition Arousal/Alertness: Awake/alert Behavior During  Therapy: Flat affect, Impulsive Overall Cognitive Status: Impaired/Different from baseline Area of Impairment: Safety/judgement, Problem solving, Awareness, Following commands, Attention Current Attention Level: Selective Following Commands: Follows one step commands with increased time Safety/Judgement: Decreased awareness of deficits Awareness: Emergent Problem Solving: Slow processing, Requires verbal cues General Comments: continues to show decreased safety awarenss and impuslivity   Blood pressure (!) 129/52, pulse 70, temperature 98.6 F (37 C), temperature source Oral, resp. rate 18, height 4\' 9"  (1.448 m), weight 64.5 kg, SpO2 90 %. Physical Exam  Nursing note and vitals reviewed. Constitutional: She  appears well-developed and well-nourished. Nasal cannula in place.  HENT:  Head: Normocephalic and atraumatic.  Eyes: Pupils are equal, round, and reactive to light. EOM are normal.  Neck: Normal range of motion. No thyromegaly present.  Respiratory: She has wheezes.  Audible wheezing during exam.   Musculoskeletal:        General: Edema (1+ pitting edema bilateral shins. ) present.    Results for orders placed or performed during the hospital encounter of 01/10/19 (from the past 48 hour(s))  Glucose, capillary     Status: Abnormal   Collection Time: 01/14/19  9:36 PM  Result Value Ref Range   Glucose-Capillary 225 (H) 70 - 99 mg/dL  CBC     Status: Abnormal   Collection Time: 01/15/19  2:54 AM  Result Value Ref Range   WBC 10.6 (H) 4.0 - 10.5 K/uL   RBC 3.62 (L) 3.87 - 5.11 MIL/uL   Hemoglobin 10.5 (L) 12.0 - 15.0 g/dL   HCT 31.7 (L) 36.0 - 46.0 %   MCV 87.6 80.0 - 100.0 fL   MCH 29.0 26.0 - 34.0 pg   MCHC 33.1 30.0 - 36.0 g/dL   RDW 15.0 11.5 - 15.5 %   Platelets 194 150 - 400 K/uL   nRBC 0.0 0.0 - 0.2 %    Comment: Performed at [facility], [place] [address] [place], [place] [address]  Basic metabolic panel     Status: Abnormal   Collection Time: 01/15/19   2:54 AM  Result Value Ref Range   Sodium 140 135 - 145 mmol/L   Potassium 3.6 3.5 - 5.1 mmol/L   Chloride 102 98 - 111 mmol/L   CO2 25 22 - 32 mmol/L   Glucose, Bld 157 (H) 70 - 99 mg/dL   BUN 18 8 - 23 mg/dL   Creatinine, Ser 1.23 (H) 0.44 - 1.00 mg/dL   Calcium 8.8 (L) 8.9 - 10.3 mg/dL   GFR calc non Af Amer 43 (L) >60 mL/min   GFR calc Af Amer 50 (L) >60 mL/min   Anion gap 13 5 - 15    Comment: Performed at [facility] [address] [place], [place] [address]  Glucose, capillary     Status: Abnormal   Collection Time: 01/15/19  6:43 AM  Result Value Ref Range   Glucose-Capillary 219 (H) 70 - 99 mg/dL  Glucose, capillary     Status: Abnormal   Collection Time: 01/15/19 11:17 AM  Result Value Ref Range   Glucose-Capillary 224 (H) 70 - 99 mg/dL  Glucose, capillary     Status: Abnormal   Collection Time: 01/15/19  4:48 PM  Result Value Ref Range   Glucose-Capillary 211 (H) 70 - 99 mg/dL  Glucose, capillary     Status: Abnormal   Collection Time: 01/15/19  8:32 PM  Result Value Ref Range   Glucose-Capillary 176 (H) 70 - 99 mg/dL  Glucose, capillary     Status: Abnormal   Collection Time: 01/15/19 10:02 PM  Result Value Ref Range   Glucose-Capillary 131 (H) 70 - 99 mg/dL  Glucose, capillary     Status: Abnormal   Collection Time: 01/16/19  6:21 AM  Result Value Ref Range   Glucose-Capillary 106 (H) 70 - 99 mg/dL   Comment 1 Notify RN    Comment 2 Document in Chart   Glucose, capillary     Status: Abnormal   Collection Time: 01/16/19 11:04 AM  Result Value Ref Range  Glucose-Capillary 267 (H) 70 - 99 mg/dL  Glucose, capillary     Status: Abnormal   Collection Time: 01/16/19  4:28 PM  Result Value Ref Range   Glucose-Capillary 250 (H) 70 - 99 mg/dL   Dg Chest 2 View  Result Date: 01/15/2019 CLINICAL DATA:  Wheezing today. EXAM: CHEST - 2 VIEW COMPARISON:  PA and lateral chest 01/09/2019 and 09/19/2018. CT chest 04/17/2018. FINDINGS: There is cardiomegaly  without edema. The patient is status post aortic valve replacement. Lungs are clear. No pneumothorax or pleural fluid. Aortic atherosclerosis noted. No acute or focal bony abnormality. IMPRESSION: No acute disease. Cardiomegaly. Atherosclerosis. Electronically Signed   By: Inge Rise M.D.   On: 01/15/2019 16:42       Medical Problem List and Plan: 1.  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  DVT Prophylaxis/Anticoagulation: Mechanical: Sequential compression devices, below knee Bilateral lower extremities 3. Chronic LBP/Pain Management: Used hydrocodone and gabapentin at home.  4. Mood: LCSW to follow for evaluation and support.  5. Neuropsych: This patient  Is not fully capable of making decisions on her own behalf. 6. Skin/Wound Care: Routine pressure relief  measures 7. Fluids/Electrolytes/Nutrition: Monitor I/O. Check lytes in am. 8. T2DM: Continue to glucotrol and metformin. Will monitor BS ac/hs.  9. COPD: Add flutter valve. Encourage IS and wean oxygen to off. Asking for her inhaler--will add nebs prn for now.  10.  Chronic diastolic CHF: Monitor for signs of fluid overload. Check weight daily to monitor for stability.  On lasix, ASA, Plavix, coreg--will change Zocor to Lipitor to avoid interaction with amlodipine.   11. Acute on CKD: has resolved. Continue to encourage fluid intake. Will recheck in am and serially.  12. HTN: Monitor BP bid. Norvasc added on 1/31 for better control.   13. Glaucoma: Stable - continue Timoptic and Xalatan.  14. Toxic metabolic encephalopathy:  Improving, likely due to worsening of CKD as well as gabapentin.  15. Anemia of chronic disease: Continue iron supplement every other day. Recheck CBC in am.   16. Leucocytosis: Monitor temps, incision for healing and for other signs of infection.    Post Admission Physician Evaluation: 1. Functional deficits secondary   to bi-cerebral infarcts, toxic encephalopathy. 2. Patient is admitted to receive collaborative, interdisciplinary care between the physiatrist, rehab nursing staff, and therapy team. 3. Patient's level of medical complexity and substantial therapy needs in context of that medical necessity cannot be provided at a lesser intensity of care such as a SNF. 4. Patient has experienced substantial functional loss from his/her baseline which was documented above under the "Functional History" and "Functional Status" headings.  Judging by the patient's diagnosis, physical exam, and functional history, the patient has potential for functional progress which will result in measurable gains while on inpatient rehab.  These gains will be of substantial and practical use upon discharge  in facilitating mobility and self-care at the household level. 5. Physiatrist will provide 24 hour management of medical needs as well as oversight of the therapy plan/treatment and provide guidance as appropriate regarding the interaction of the two. 6. The Preadmission Screening has been reviewed and patient status is unchanged unless otherwise stated above. 7. 24 hour rehab nursing will assist with bladder management, bowel management, safety, skin/wound care, disease management, medication administration, pain management and patient education  and help integrate therapy concepts, techniques,education, etc. 8. PT will assess and treat for/with: Lower extremity strength, range  of motion, stamina, balance, functional mobility, safety, adaptive techniques and equipment, NMR, family ed.   Goals are: mod I. 9. OT will assess and treat for/with: ADL's, functional mobility, safety, upper extremity strength, adaptive techniques and equipment, NMR, family ed.   Goals are: mod I. Therapy may proceed with showering this patient. 10. SLP will assess and treat for/with: cognition, communication.  Goals are: mod I. 11. Case Management and Social  Worker will assess and treat for psychological issues and discharge planning. 12. Team conference will be held weekly to assess progress toward goals and to determine barriers to discharge. 13. Patient will receive at least 3 hours of therapy per day at least 5 days per week. 14. ELOS: 10-14 days       15. Prognosis:  excellent   I have personally performed a face to face diagnostic evaluation of this patient and formulated the key components of the plan.  Additionally, I have personally reviewed laboratory data, imaging studies, as well as relevant notes and concur with the physician assistant's documentation above.  Meredith Staggers, MD, [facility]    Bary Leriche, PA-C 01/16/2019

## 2019-01-13 NOTE — Progress Notes (Signed)
  [facility]   MULTIDISCIPLINARY HEART VALVE TEAM   Paged by Rn due to uncontrolled hypertension. Home amlodipine resumed today but she remains hypertensive. HCTZ being held given AKI. Will start Coreg 6.25mg  BID (HR in 80s). K also noted to be 3.0. This has been supplemented. Of note, MRI shows scattered infarcts. She is being followed by neurology. She can be discharged to inpatient rehab when cleared by neurology.   Angelena Form PA-C  [facility]

## 2019-01-13 NOTE — Progress Notes (Addendum)
Paged Lesli Albee, PA via Amion at this time. R/T  MRI results  1609: Paged Dr. Leonel Ramsay at this time r/t MRI results.   Spoke with Dr. Leonel Ramsay, patient needs to stay on 4E not go to rehab for observation at this time

## 2019-01-13 NOTE — Progress Notes (Signed)
CHART NOTE  MRI completed and reviewed - shows scattered embolic strokes in bilateral hemispheres, likely cardioembolic. Scattered strokes and gabapentin toxicity could be etiology of gait difficulty. Will start stroke orderset and stroke team will follow the patient with you. Please call with questions.   Amie Portland, MD Neurohospitalist

## 2019-01-13 NOTE — Care Management Important Message (Signed)
Important Message  Patient Details  Name: [name] [name] MRN: [id number] Date of Birth: 15-Sep-1946   Medicare Important Message Given:  Yes    Barb Merino Jacquel Redditt 01/13/2019, 2:56 PM

## 2019-01-13 NOTE — Progress Notes (Signed)
Inpatient Rehabilitation-Admissions Coordinator   Pt's insurance has not yet made a determination regarding CIR. AC will follow up with pt on Monday for possible admit, pending medical clearance and insurance authorization decision.   Please call if questions.   Jhonnie Garner, OTR/L  Rehab Admissions Coordinator  [phone] 01/13/2019 4:59 PM

## 2019-01-13 NOTE — Progress Notes (Signed)
Lesli Albee, PA notified of patient's elevated BP. New orders received.

## 2019-01-13 NOTE — Evaluation (Signed)
Occupational Therapy Evaluation Patient Details Name: [name] [name] MRN: [id number] DOB: December 20, 1945 Today's Date: 01/13/2019    History of Present Illness Pt is a 73 y.o. F with significant PMH of CHF, DM2, CKD, RA who is now s/p transcatheter aortic valve replacement.     Clinical Impression   PATIENT IS REQUIRING 2 PERSON ASSIST WITH ADLS AND LE ADLS. PATIENT COGNITION HAS ALSO DECREASED FROM BASELINE PER FAMILY. PATIENT HAS DECREASED INSIGHT AND SAFETY AND ATTEMPTS TO STAND WITHOUT ASSIST. PATIENT WOULD BENEFIT FROM CIR PRIOR TO D/C HOME.    Follow Up Recommendations  CIR    Equipment Recommendations  None recommended by OT    Recommendations for Other Services Rehab consult     Precautions / Restrictions Precautions Precautions: Fall Restrictions Weight Bearing Restrictions: No      Mobility Bed Mobility Overal bed mobility: Needs Assistance Bed Mobility: Sit to Supine       Sit to supine: Min assist      Transfers       Sit to Stand: Mod assist;+2 physical assistance;+2 safety/equipment Stand pivot transfers: Mod assist;+2 physical assistance;+2 safety/equipment       General transfer comment: PATIENT KNEES ARE BUCKELING,     Balance                                           ADL either performed or assessed with clinical judgement   ADL Overall ADL's : Needs assistance/impaired Eating/Feeding: Supervision/ safety;Set up   Grooming: Wash/dry hands;Wash/dry face;Minimal assistance(PATIENT IS HAVING JERKY MOVEMENTS.)   Upper Body Bathing: Minimal assistance;Moderate assistance;Sitting Upper Body Bathing Details (indicate cue type and reason): PATIENT NEEDS OCCASIONALLY MIN GUARD FOR SITTING BALANCE Lower Body Bathing: Moderate assistance;Maximal assistance;+2 for physical assistance;+2 for safety/equipment;Sit to/from stand Lower Body Bathing Details (indicate cue type and reason): PATIENT KNEES ARE BUCKELING IN STANDING.  Upper  Body Dressing : Moderate assistance;Sitting Upper Body Dressing Details (indicate cue type and reason): (PATIENT WITH JERKY MOVEMENTS) Lower Body Dressing: Total assistance;+2 for physical assistance;+2 for safety/equipment;Sit to/from stand Lower Body Dressing Details (indicate cue type and reason): PNT KNEES ARE [place] Toilet Transfer: Moderate assistance;+2 for physical assistance;+2 for safety/equipment   Toileting- Clothing Manipulation and Hygiene: Total assistance;+2 for physical assistance;+2 for safety/equipment;Sit to/from stand       Functional mobility during ADLs: (PATIENT IS A 2 PERSON STAND PIVOT. ) General ADL Comments: (PATIENT IS REQUIRING ADDITIONAL ASSIST THAN SHE REQUIRED PTA)     Vision Baseline Vision/History: Wears glasses;Glaucoma Wears Glasses: At all times Patient Visual Report: No change from baseline       Perception     Praxis      Pertinent Vitals/Pain Pain Assessment: No/denies pain     Hand Dominance Right   Extremity/Trunk Assessment Upper Extremity Assessment Upper Extremity Assessment: Generalized weakness(PATIENT IS OCCASIONALLY HAVING JERKY MOVEMENTS. )           Communication Communication Communication: No difficulties   Cognition Arousal/Alertness: Lethargic Behavior During Therapy: WFL for tasks assessed/performed Overall Cognitive Status: Impaired/Different from baseline Area of Impairment: Safety/judgement                         Safety/Judgement: Decreased awareness of safety;Decreased awareness of deficits         General Comments       Exercises     Shoulder Instructions  Home Living Family/patient expects to be discharged to:: Private residence Living Arrangements: Children Available Help at Discharge: Family;Available 24 hours/day Type of Home: House Home Access: Stairs to enter [employer] of Steps: 2 Entrance Stairs-Rails: None Home Layout: One level     Bathroom  Shower/Tub: Occupational psychologist: Standard     Home Equipment: Environmental consultant - 4 wheels;Cane - quad;Cane - single point;Shower seat   Additional Comments: per family she would fall every once in a while      Prior Functioning/Environment Level of Independence: Independent with assistive device(s)        Comments: uses Rollator        OT Problem List: Decreased strength;Decreased activity tolerance;Impaired balance (sitting and/or standing);Decreased cognition;Decreased safety awareness;Decreased knowledge of use of DME or AE      OT Treatment/Interventions: Self-care/ADL training;DME and/or AE instruction;Therapeutic activities;Patient/family education    OT Goals(Current goals can be found in the care plan section) Acute Rehab OT Goals Patient Stated Goal: I WANT TO GET BETTER OT Goal Formulation: With patient Time For Goal Achievement: 01/27/19 Potential to Achieve Goals: Good  OT Frequency: Min 2X/week   Barriers to D/C: (PATIENT IS REQUIRING EXTENSIVE ASSIST WITH ADLS AND MOBILITY)          Co-evaluation              AM-PAC OT "6 Clicks" Daily Activity     Outcome Measure Help from another person eating meals?: A Little Help from another person taking care of personal grooming?: A Little Help from another person toileting, which includes using toliet, bedpan, or urinal?: Total Help from another person bathing (including washing, rinsing, drying)?: A Lot Help from another person to put on and taking off regular upper body clothing?: A Lot Help from another person to put on and taking off regular lower body clothing?: Total 6 Click Score: 12   End of Session Nurse Communication: (OK THERAPY)  Activity Tolerance: Patient limited by fatigue;Patient limited by lethargy Patient left: Other (comment)(PT)  OT Visit Diagnosis: Unsteadiness on feet (R26.81);Repeated falls (R29.6);Muscle weakness (generalized) (M62.81);History of falling (Z91.81)                 Time: 3361-2244 OT Time Calculation (min): 25 min Charges:  OT General Charges $OT Visit: 1 Visit OT Evaluation $OT Eval Moderate Complexity: 1 Mod   6 CLICKS  [name] [name] 01/13/2019, 10:12 AM

## 2019-01-13 NOTE — PMR Pre-admission (Addendum)
PMR Admission Coordinator Pre-Admission Assessment  Patient: [name] [name] is an 73 y.o., female MRN: [id number] DOB: 05/07/1946 Height: '4\' 9"'$  (144.8 cm) Weight: 64.5 kg              Insurance Information HMO:     PPO:      PCP:      IPA:      80/20:      OTHER:  PRIMARY: UHC Medicare      Policy#: [id number]      Subscriber: Patient CM Name: [employer]      Phone#: [phone]     Fax#: [phone] Pre-Cert#: [id number]      Employer:  Josem Kaufmann provided by [employer] at [facility] Medicare for admit to CIR on 01/16/19. Updated clinicals are due to [facility] by day 7 (01/22/19) at (p): [phone]; (f): [phone] Benefits:  Phone #: [phone]     Name: NA Eff. Date: 12/14/18     Deduct: $0      Out of Pocket Max: $4,500 (met $5)      Life Max: NA CIR: $325/day for dayas 1-5, $0/day for days 6+      SNF: $0day for days 1-20, $160/day for dayas 21-49, $0/day for days 50-100; per medical necessity, 100 day limit Outpatient:      Co-Pay: $35/visit Home Health: 100%      Co-Pay: $0 DME: 80%     Co-Pay: 20% Providers:  SECONDARY:       Policy#:       Subscriber:  CM Name:       Phone#:      Fax#:  Pre-Cert#:       Employer:  Benefits:  Phone #:      Name:  Eff. Date:      Deduct:       Out of Pocket Max:      Life Max:  CIR:       SNF:  Outpatient:      Co-Pay:  Home Health:       Co-Pay:  DME:      Co-Pay:   Medicaid Application Date:       Case Manager:  Disability Application Date:       Case Worker:   Emergency Contact Information Contact Information    Name Relation Home Work Mobile   Jefferson,Dorothy Daughter [phone]     Conchetta, Lamia Daughter [phone]       Current Medical History  Patient Admitting Diagnosis: Deconditioning after TAVR History of Present Illness: Pt is a 73 yo F with history of chronic diastolic CHF, COPD, V6HY, chronic back pain, gait disorder with falls, RA, moderate to severe AS. Pt was admitted on 01/10/19 for TVAR. Post op, pt noted to have  decreased functional mobility and decreased awareness of deficits. PT noting myoclonic jerking of BLEs that caused knee buckling, which is an acute onset according to family. CIR recommended after therapy evaluations. Due to symptomology, neurology was consulted. 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 MRI of the brain was ordered on 01/13/19. 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 Anticoagulation is not indicated. Therapies have evaluated pt and are recommending CIR. Pt is to be admitted to CIR on 01/16/19.  Complete NIHSS TOTAL: 0    Past Medical History  Past Medical History:  Diagnosis Date  . Anemia   . Anxiety   . Arthritis   . Blood transfusion without reported diagnosis   . Bronchitis   .  CHF (congestive heart failure) ([place])   . CKD (chronic kidney disease) stage 3, GFR 30-59 ml/min (HCC) 08/18/2018  . Depression   . Diabetes mellitus   . Diverticulitis   . Glaucoma   . Hyperlipidemia   . Hypertension   . Hypothyroidism   . Macular degeneration   . Melanoma ([place])    left thigh, 2010  . Osteoarthritis   . Peptic ulcer   . Rheumatoid arthritis(714.0)   . 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 Severe aortic stenosis     Family History  family history includes Bipolar disorder in her daughter; Breast cancer in her sister; Colon cancer in her brother; Diabetes in her brother, mother, sister, and sister; Glaucoma in her mother; Heart attack in her brother; Heart failure in her father and mother; Hypertension in her daughter, mother, sister, sister, sister, and another family member; Other in her sister and another family member.  Prior Rehab/Hospitalizations:  Has the patient had major surgery during 100 days prior to admission? No  Current Medications   Current Facility-Administered  Medications:  .   stroke: mapping our early stages of recovery book, , Does not apply, Once, Amie Portland, MD .  0.9 %  sodium chloride infusion, 250 mL, Intravenous, PRN, Eileen Stanford, PA-C .  acetaminophen (TYLENOL) tablet 650 mg, 650 mg, Oral, Q6H PRN, 650 mg at 01/11/19 2124 **OR** acetaminophen (TYLENOL) suppository 650 mg, 650 mg, Rectal, Q6H PRN, Eileen Stanford, PA-C .  albuterol (PROVENTIL) (2.5 MG/3ML) 0.083% nebulizer solution 2.5 mg, 2.5 mg, Nebulization, Q6H PRN, Coniglio, Amanda C, MD, 2.5 mg at 01/15/19 2034 .  ALPRAZolam Duanne Moron) tablet 0.5 mg, 0.5 mg, Oral, QHS PRN, Eileen Stanford, PA-C, 0.5 mg at 01/11/19 2124 .  amLODipine (NORVASC) tablet 10 mg, 10 mg, Oral, QHS, Eileen Stanford, PA-C, 10 mg at 01/15/19 2215 .  aspirin EC tablet 81 mg, 81 mg, Oral, Daily, Eileen Stanford, PA-C, 81 mg at 01/16/19 1112 .  carvedilol (COREG) tablet 6.25 mg, 6.25 mg, Oral, BID WC, Eileen Stanford, PA-C, 6.25 mg at 01/16/19 4696 .  clopidogrel (PLAVIX) tablet 75 mg, 75 mg, Oral, Q breakfast, Eileen Stanford, PA-C, 75 mg at 01/16/19 2952 .  ferrous sulfate tablet 325 mg, 325 mg, Oral, Deirdre Peer, PA-C, 325 mg at 01/16/19 1112 .  fluticasone (FLONASE) 50 MCG/ACT nasal spray 1 spray, 1 spray, Each Nare, Daily, Lujean Amel, MD, 1 spray at 01/16/19 1114 .  furosemide (LASIX) tablet 20 mg, 20 mg, Oral, Daily, Croitoru, Mihai, MD, 20 mg at 01/16/19 1112 .  gabapentin (NEURONTIN) capsule 200 mg, 200 mg, Oral, BID, Greta Doom, MD, 200 mg at 01/15/19 0835 .  glipiZIDE (GLUCOTROL) tablet 10 mg, 10 mg, Oral, QAC breakfast, Croitoru, Mihai, MD, 10 mg at 01/16/19 0829 .  CBG monitoring, , , 4x Daily, AC & HS **AND** insulin aspart (novoLOG) injection 0-24 Units, 0-24 Units, Subcutaneous, TID AC & HS, Eileen Stanford, PA-C, 12 Units at 01/16/19 1115 .  latanoprost (XALATAN) 0.005 % ophthalmic solution 1 drop, 1 drop, Both Eyes, QHS, Lujean Amel, MD,  1 drop at 01/15/19 2216 .  levothyroxine (SYNTHROID, LEVOTHROID) tablet 88 mcg, 88 mcg, Oral, QAC breakfast, Eileen Stanford, PA-C, 88 mcg at 01/16/19 8413 .  metFORMIN (GLUCOPHAGE) tablet 500 mg, 500 mg, Oral, BID WC, Croitoru, Mihai, MD, 500 mg  at 01/16/19 0829 .  metoprolol tartrate (LOPRESSOR) injection 2.5-5 mg, 2.5-5 mg, Intravenous, Q2H PRN, Eileen Stanford, PA-C, 2.5 mg at 01/13/19 1649 .  morphine 2 MG/ML injection 1-4 mg, 1-4 mg, Intravenous, Q1H PRN, Eileen Stanford, PA-C .  ondansetron [facility]) injection 4 mg, 4 mg, Intravenous, Q6H PRN, Eileen Stanford, PA-C, 4 mg at 01/15/19 1557 .  oxyCODONE (Oxy IR/ROXICODONE) immediate release tablet 5-10 mg, 5-10 mg, Oral, Q3H PRN, Eileen Stanford, PA-C .  simvastatin (ZOCOR) tablet 40 mg, 40 mg, Oral, QHS, Rinehuls, David L, PA-C, 40 mg at 01/15/19 2215 .  sodium chloride flush (NS) 0.9 % injection 3 mL, 3 mL, Intravenous, Q12H, Eileen Stanford, PA-C, 3 mL at 01/16/19 1116 .  sodium chloride flush (NS) 0.9 % injection 3 mL, 3 mL, Intravenous, PRN, Angelena Form R, PA-C .  timolol (TIMOPTIC) 0.5 % ophthalmic solution 1 drop, 1 drop, Both Eyes, BID, Lujean Amel, MD, 1 drop at 01/16/19 1113 .  traMADol (ULTRAM) tablet 50-100 mg, 50-100 mg, Oral, Q4H PRN, Eileen Stanford, PA-C, 100 mg at 01/15/19 7371  Patients Current Diet:  Diet Order            Diet Heart Room service appropriate? Yes; Fluid consistency: Thin  Diet effective now              Precautions / Restrictions Precautions Precautions: Fall Restrictions Weight Bearing Restrictions: No   Has the patient had 2 or more falls or a fall with injury in the past year?Yes  Prior Activity Level Limited Community (1-2x/wk): not working PTA; driving some; not very active  Development worker, international aid / Paramedic Devices/Equipment: Environmental consultant (specify type), Eyeglasses Home Equipment: Environmental consultant - 4 wheels, Cane - quad, [place] - single point, Estate manager/land agent  Prior Device Use: Indicate devices/aids used by the patient prior to current illness, exacerbation or injury? Walker  Prior Functional Level Prior Function Level of Independence: Independent with assistive device(s) Comments: uses Rollator  Self Care: Did the patient need help bathing, dressing, using the toilet or eating?  Independent  Indoor Mobility: Did the patient need assistance with walking from room to room (with or without device)? Independent  Stairs: Did the patient need assistance with internal or external stairs (with or without device)? Independent  Functional Cognition: Did the patient need help planning regular tasks such as shopping or remembering to take medications? Needed some help  Current Functional Level Cognition  Arousal/Alertness: Awake/alert Overall Cognitive Status: Impaired/Different from baseline Current Attention Level: Selective Orientation Level: Oriented X4 Following Commands: Follows one step commands with increased time Safety/Judgement: Decreased awareness of deficits General Comments: continues to show decreased safety awarenss and impuslivity Attention: Selective, Alternating Selective Attention: Impaired Selective Attention Impairment: Verbal basic, Functional basic("i need to turn that tv off") Alternating Attention: Impaired Alternating Attention Impairment: Functional basic(difficulty returning to task after interruptions) Memory: Impaired Memory Impairment: Storage deficit, Decreased short term memory(slow processing, decreased working memory) Decreased Short Term Memory: Verbal basic, Functional basic Awareness: Impaired Awareness Impairment: Other (comment)(splinter skills; pt initially denies deficits) Problem Solving: Impaired(slow processing) Executive Function: Organizing(clock drawing with poor spacing, numbers placed outside circ) Behaviors: Other (comment)(flat affect) Safety/Judgment: Impaired(pt has fallen with  admission)    Extremity Assessment (includes Sensation/Coordination)  Upper Extremity Assessment: RUE deficits/detail, LUE deficits/detail RUE Deficits / Details: Pt with decreased strength and coordination. Pt both overshooting and undershooting during finger to nose test and in ADLs.  RUE Coordination: decreased fine motor, decreased gross  motor LUE Deficits / Details: Pt with decreased strength and coordination. Pt both overshooting and undershooting during finger to nose test and in ADLs.  LUE Coordination: decreased fine motor, decreased gross motor  Lower Extremity Assessment: Defer to PT evaluation RLE Coordination: decreased gross motor LLE Coordination: decreased gross motor    ADLs  Overall ADL's : Needs assistance/impaired Eating/Feeding: Supervision/ safety, Set up Grooming: Brushing hair, Wash/dry hands, Wash/dry face, Oral care, Sitting, Standing, Set up, Min guard Grooming Details (indicate cue type and reason): stood to brush teeth x 2 minutes Upper Body Bathing: Minimal assistance, Moderate assistance, Sitting Upper Body Bathing Details (indicate cue type and reason): PATIENT NEEDS OCCASIONALLY MIN GUARD FOR SITTING BALANCE Lower Body Bathing: Moderate assistance, Maximal assistance, +2 for physical assistance, +2 for safety/equipment, Sit to/from stand Lower Body Bathing Details (indicate cue type and reason): PATIENT KNEES ARE BUCKELING IN STANDING.  Upper Body Dressing : Set up, Sitting Upper Body Dressing Details (indicate cue type and reason): (PATIENT WITH JERKY MOVEMENTS) Lower Body Dressing: Set up, Sitting/lateral leans Lower Body Dressing Details (indicate cue type and reason): slip on shoes Toilet Transfer: Minimal assistance, Ambulation, RW, Regular Toilet Toileting- Clothing Manipulation and Hygiene: Minimal assistance, Sit to/from stand Toileting - Clothing Manipulation Details (indicate cue type and reason): assist to manage mesh panties Functional  mobility during ADLs: Minimal assistance, +2 for safety/equipment, Cueing for safety, Rolling walker General ADL Comments: pt with vomiting at end of session, RN notified    Mobility  Overal bed mobility: Needs Assistance Bed Mobility: Supine to Sit Supine to sit: Min guard, HOB elevated Sit to supine: Min guard General bed mobility comments: Min Guard A for safety    Transfers  Overall transfer level: Needs assistance Equipment used: Rolling walker (2 wheeled) Transfers: Sit to/from Stand Sit to Stand: Min assist Stand pivot transfers: Mod assist, +2 physical assistance General transfer comment: Min assist to power up to standing with use of RW    Ambulation / Gait / Stairs / Wheelchair Mobility  Ambulation/Gait Ambulation/Gait assistance: Herbalist (Feet): 200 Feet Assistive device: Rolling walker (2 wheeled), None Gait Pattern/deviations: Step-through pattern, Decreased stride length, Drifts right/left General Gait Details: patient with poor ability to navigate safely. Noted bilateral LE buckling with ambulation, 2 seated rest breaks required due to decreased activity tolerance. Ambulated on 3 liters supplemental O2 with saturations to mid 80s and increased WOB with audible wheezing noted Gait velocity: decreased Gait velocity interpretation: <1.8 ft/sec, indicate of risk for recurrent falls    Posture / Balance Balance Overall balance assessment: Needs assistance Sitting-balance support: Feet supported Sitting balance-Leahy Scale: Fair Standing balance support: Bilateral upper extremity supported Standing balance-Leahy Scale: Poor Standing balance comment: reliance on UE support during functional task performance    Special needs/care consideration BiPAP/CPAP: no CPM: no Continuous Drip IV: no Dialysis: no        Days: no Life Vest: no Oxygen: no Special Bed: no Trach Size:no Wound Vac (area): no      Location: no Skin: no areas of concern                       Bowel mgmt: last BM: 01/09/19 Bladder mgmt: purwik in place Diabetic mgmt: yes     Previous Home Environment Living Arrangements: Children(daughter) Available Help at Discharge: Family, Available 24 hours/day Type of Home: House Home Layout: One level Home Access: Stairs to enter Entrance Stairs-Rails: None Entrance Stairs-Number of Steps: 2 Bathroom Shower/Tub:  Walk-in Radio producer: Standard Home Care Services: No Additional Comments: Reports history of falls once per week   Discharge Living Setting Plans for Discharge Living Setting: Patient's home, Lives with (comment)(up to 5 family members) Type of Home at Discharge: House Discharge Home Layout: One level Discharge Home Access: Stairs to enter Entrance Stairs-Rails: None Entrance Stairs-Number of Steps: 1 step then 1 ledge Discharge Bathroom Shower/Tub: Walk-in shower Discharge Bathroom Toilet: Standard Discharge Bathroom Accessibility: Yes How Accessible: Accessible via walker Does the patient have any problems obtaining your medications?: No  Social/Family/Support Systems Patient Roles: Other (Comment)(very supportive family member) Contact Information: Carrolyn Leigh" [phone], [name] (daughter): [phone] Anticipated Caregiver: all 5 family members (DJ present during admission process) Anticipated Caregiver's Contact Information: see above Ability/Limitations of Caregiver: Min A Caregiver Availability: 24/7 Discharge Plan Discussed with Primary Caregiver: Yes Is Caregiver In Agreement with Plan?: Yes Does Caregiver/Family have Issues with Lodging/Transportation while Pt is in Rehab?: No   Goals/Additional Needs Patient/Family Goal for Rehab: PT/OT/SLP: Mod I Expected length of stay: 10-14 days Cultural Considerations: NA Dietary Needs: Heart Healthy/carb modified; thin liquids Equipment Needs: TBD Pt/Family Agrees to Admission and willing to participate: Yes Program Orientation Provided  & Reviewed with Pt/Caregiver Including Roles  & Responsibilities: Yes  Barriers to Discharge: Inaccessible home environment, Home environment access/layout  Barriers to Discharge Comments: steps to enter   Decrease burden of Care through IP rehab admission: NA   Possible need for SNF placement upon discharge: Not anticipated; pt has good social support at home with family willing to assist as needed. Pt has good prognosis for further progress through CIR.    Patient Condition: This patient's medical and functional status has changed since the consult dated: 01/12/19 in which the Rehabilitation Physician determined and documented that the patient's condition is appropriate for intensive rehabilitative care in an inpatient rehabilitation facility. See "History of Present Illness" (above) for medical update. Functional changes are: Improvement in functional transfers from Mod A x2 to Min A and improvement from BLE buckling with deferring gait to Min A. Patient's medical and functional status update has been discussed with the Rehabilitation physician and patient remains appropriate for inpatient rehabilitation. Will admit to inpatient rehab today.  Preadmission Screen Completed By:  Jhonnie Garner, 01/16/2019 4:13 PM ______________________________________________________________________   Discussed status with Dr. Naaman Plummer on 01/16/19 at 4:12PM and received telephone approval for admission today.  Admission Coordinator:  Jhonnie Garner, time 4:12PM/Date 12/16/18

## 2019-01-13 NOTE — Progress Notes (Signed)
CARDIAC REHAB PHASE I   Pt in recliner, states she's continuing to have weakness in her BL legs. Getting neuro w/u. Pt educated on site care. Encouraged mobility as able with stress on safety.   [phone] Rufina Falco, RN BSN 01/13/2019 10:40 AM

## 2019-01-13 NOTE — Progress Notes (Signed)
Inpatient Rehabilitation-Admissions Coordinator    Met with patient and her daughter at the bedside to discuss team's recommendation for inpatient rehabilitation. Shared booklets, expectations while in CIR, expected length of stay, and anticipated functional level at DC. Pt wanting to pursue CIR at this time.   AC has began insurance authorization process for possible admit. Noted neurology to possibly see patient today; AC will follow for both medical readiness and insurance authorization.   Please call if questions.  Jhonnie Garner, OTR/L  Rehab Admissions Coordinator  [phone] 01/13/2019 10:51 AM

## 2019-01-13 NOTE — Consult Note (Signed)
Neurology Consultation Reason for Consult: Difficulty walking.  Referring Physician: Angelena Form, C  CC: difficulty walking.   History is obtained from: Patient, family  HPI: [name] [name] is a 73 y.o. female admitted for TAVR for aortic stenosis with abnormal movements and difficulty walking.  She states that at home, she will occasionally drop things, she has to hold her coffee cups on top of her stomach to keep from dropping things.  Since admission, however, she has had prominent difficulty with walking with her legs just suddenly giving way.  She has also had problems with her hands and dropping things.  She normally takes gabapentin 300 mg twice daily, but since admission has been getting 600 mg 3 times daily and her creatinine has increased from baseline around 1.2 - 1.4 to 2.0 in house, eGFR of 23 yesterday.   ROS: A 14 point ROS was performed and is negative except as noted in the HPI.  Past Medical History:  Diagnosis Date  . Anemia   . Anxiety   . Arthritis   . Blood transfusion without reported diagnosis   . Bronchitis   . CHF (congestive heart failure) ([place])   . CKD (chronic kidney disease) stage 3, GFR 30-59 ml/min (HCC) 08/18/2018  . Depression   . Diabetes mellitus   . Diverticulitis   . Glaucoma   . Hyperlipidemia   . Hypertension   . Hypothyroidism   . Macular degeneration   . Melanoma ([place])    left thigh, 2010  . Osteoarthritis   . Peptic ulcer   . Rheumatoid arthritis(714.0)   . 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 Severe aortic stenosis      Family History  Problem Relation Age of Onset  . Colon cancer Brother        diagnosed at age 63  . Diabetes Brother   . Hypertension Mother   . Heart failure Mother   . Diabetes Mother   . Glaucoma Mother   . Hypertension Other   . Other Other   . Heart failure Father    passed away of MI in his 68s  . Diabetes Sister   . Hypertension Sister   . Breast cancer Sister   . Heart attack Brother   . Diabetes Sister   . Hypertension Sister   . Other Sister        tumor  . Hypertension Sister   . Hypertension Daughter   . Bipolar disorder Daughter   . Anesthesia problems Neg Hx   . Amblyopia Neg Hx   . Blindness Neg Hx   . Cataracts Neg Hx   . Macular degeneration Neg Hx   . Retinal detachment Neg Hx   . Strabismus Neg Hx   . Retinitis pigmentosa Neg Hx      Social History:  reports that she has never smoked. She has never used smokeless tobacco. She reports that she does not drink alcohol or use drugs.   Exam: Current vital signs: BP (!) 160/52 (BP Location: Right Arm)   Pulse 85   Temp 98.7 F (37.1 C) (Oral)   Resp 17   Ht _0  (1.448 m)   Wt 65.3 kg   SpO2 91%   BMI 31.15 kg/m  Vital signs in last 24 hours: Temp:  [98.1 F (36.7 C)-99.7 F (37.6 C)] 98.7 F (37.1 C) (01/31 0754) Pulse Rate:  [78-88]  85 (01/31 0754) Resp:  [13-20] 17 (01/31 0754) BP: (114-163)/(48-57) 160/52 (01/31 0754) SpO2:  [91 %-96 %] 91 % (01/31 0437) Weight:  [65.3 kg] 65.3 kg (01/31 0437)   Physical Exam  Constitutional: Appears well-developed and well-nourished.  Psych: Affect appropriate to situation Eyes: No scleral injection HENT: No OP obstrucion Head: Normocephalic.  Cardiovascular: Normal rate and regular rhythm.  Respiratory: Effort normal, non-labored breathing GI: Soft.  No distension. There is no tenderness.  Skin: WDI  Neuro: Mental Status: Patient is awake, alert, oriented to person, place, month, year, and situation. Patient is able to give a clear and coherent history. No signs of aphasia or neglect Cranial Nerves: II: Visual Fields are full. Pupils are equal, round, and reactive to light.   III,IV, VI: EOMI without ptosis or diploplia.  V: Facial sensation is symmetric to temperature VII: Facial movement with decreased NL fold  on the right.  VIII: hearing is intact to voice X: Uvula elevates symmetrically XI: Shoulder shrug is symmetric. XII: tongue is midline without atrophy or fasciculations.  Motor: Tone is normal. Bulk is normal. 5/5 strength was present in all four extremities.  Sensory: Sensation is symmetric to light touch and temperature in the arms, but decreased to LT and temperature in the right leg.  Cerebellar: No clear ataxia, but prominent asterixis bilaterally.    I have reviewed labs in epic and the results pertinent to this consultation are: Cr 1.55 today, down from 2.08 yesterday.   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 She typically takes 678m/day divided into two doses, and with the increase to 6050mTID and AKI in addition, I suspect that she is quite toxic.   This would not cause decreased leg sensation and though her facial flattening is mild, when coupled with slurred speech I do think that investigation with MRI is needed.   I would typically just stop gabapentin, but the patient is very anxious about abrupt discontinuation and therefore will hold the rest of today's dose and decrease to 20029mID starting tomorrow.   Recommendations: 1) MRI brain 2) hold gabapentin today, restart at 200m54mD starting tomorrow.  3) ammonia level 4) Will follow.    McNeRoland Rack [facility] [phone] 7pm- 7am, please page neurology on call as listed in [facility]

## 2019-01-13 NOTE — Progress Notes (Addendum)
[facility] VALVE TEAM  Patient Name: [name] [name] Date of Encounter: 01/13/2019  Primary Cardiologist: Dr. Bronson Ing / Dr. Angelena Form & Dr. Roxy Manns (TAVR)  Hospital Problem List     Principal Problem:   S/P TAVR (transcatheter aortic valve replacement) Active Problems:   Essential hypertension   Hyperlipidemia   Chronic diastolic CHF (congestive heart failure) (HCC)   CKD (chronic kidney disease) stage 3, GFR 30-59 ml/min (HCC)   Severe aortic stenosis   Rheumatoid arthritis ([place])   Hypothyroidism   Diabetes mellitus, type 2 (HCC)     Subjective   No complaints. Sleeping   Inpatient Medications    Scheduled Meds: . amLODipine  10 mg Oral QHS  . aspirin EC  81 mg Oral Daily  . clopidogrel  75 mg Oral Q breakfast  . ferrous sulfate  325 mg Oral QODAY  . fluticasone  1 spray Each Nare Daily  . gabapentin  600 mg Oral TID  . insulin aspart  0-24 Units Subcutaneous TID AC & HS  . latanoprost  1 drop Both Eyes QHS  . levothyroxine  88 mcg Oral QAC breakfast  . simvastatin  20 mg Oral QHS  . sodium chloride flush  3 mL Intravenous Q12H  . timolol  1 drop Both Eyes BID   Continuous Infusions: . sodium chloride    . nitroGLYCERIN    . phenylephrine (NEO-SYNEPHRINE) Adult infusion     PRN Meds: sodium chloride, acetaminophen **OR** acetaminophen, ALPRAZolam, metoprolol tartrate, morphine injection, ondansetron (ZOFRAN) IV, oxyCODONE, sodium chloride flush, traMADol   Vital Signs    Vitals:   01/12/19 2045 01/12/19 2348 01/13/19 0437 01/13/19 0754  BP: (!) 160/56 (!) 149/48 (!) 163/57 (!) 160/52  Pulse: 78 82 88 85  Resp:  20 20 17   Temp: 98.1 F (36.7 C) 99.7 F (37.6 C) 98.5 F (36.9 C) 98.7 F (37.1 C)  TempSrc: Oral Oral Oral Oral  SpO2: 91% 92% 91%   Weight:   65.3 kg   Height:        Intake/Output Summary (Last 24 hours) at 01/13/2019 0758 Last data filed at 01/13/2019 7711 Gross per 24 hour  Intake 1200 ml    Output 2000 ml  Net -800 ml   Filed Weights   01/10/19 0825 01/11/19 0500 01/13/19 0437  Weight: 62.6 kg 65.9 kg 65.3 kg    Physical Exam    GEN: chronically ill and disheveled appreaing  HEENT: Grossly normal.  Neck: Supple, no JVD, carotid bruits, or masses. Cardiac: RRR, no murmurs, rubs, or gallops. No clubbing, cyanosis, edema.  Radials/DP/PT 2+ and equal bilaterally.  Respiratory:  Respirations regular and unlabored, clear to auscultation bilaterally. GI: Soft, nontender, nondistended, BS + x 4. MS: no deformity or atrophy. Skin: warm and dry, no rash. Groin sites are stable Neuro:  Strength and sensation are intact. Psych: AAOx3.  Normal affect.  Labs    CBC Recent Labs    01/12/19 0456 01/13/19 0522  WBC 8.5 10.1  HGB 9.2* 9.9*  HCT 28.6* 30.1*  MCV 89.1 87.0  PLT 160 657   Basic Metabolic Panel Recent Labs    01/11/19 0445 01/12/19 0456 01/13/19 0522  NA 137 137 139  K 3.3* 3.5 3.0*  CL 98 98 99  CO2 28 30 29   GLUCOSE 94 130* 190*  BUN 24* 29* 25*  CREATININE 1.76* 2.08* 1.55*  CALCIUM 8.2* 7.9* 8.6*  MG 2.3  --   --  Liver Function Tests No results for input(s): AST, ALT, ALKPHOS, BILITOT, PROT, ALBUMIN in the last 72 hours. No results for input(s): LIPASE, AMYLASE in the last 72 hours. Cardiac Enzymes No results for input(s): CKTOTAL, CKMB, CKMBINDEX, TROPONINI in the last 72 hours. BNP Invalid input(s): POCBNP D-Dimer No results for input(s): DDIMER in the last 72 hours. Hemoglobin A1C No results for input(s): HGBA1C in the last 72 hours. Fasting Lipid Panel No results for input(s): CHOL, HDL, LDLCALC, TRIG, CHOLHDL, LDLDIRECT in the last 72 hours. Thyroid Function Tests No results for input(s): TSH, T4TOTAL, T3FREE, THYROIDAB in the last 72 hours.  Invalid input(s): FREET3  Telemetry    sinus brady- Personally Reviewed  ECG    Sinus IRBBB (old) - Personally Reviewed  Radiology    No results found.  Cardiac Studies    TAVR OPERATIVE NOTE   Date of Procedure:                01/10/2019  Preoperative Diagnosis:      Severe Aortic Stenosis   Postoperative Diagnosis:    Same   Procedure:        Transcatheter Aortic Valve Replacement - Percutaneous Right Transfemoral Approach             Edwards Sapien 3 THV (size 23 mm, model # 9600TFX, serial # [id number])              Co-Surgeons:                        Lauree Chandler, MD and Valentina Gu. Roxy Manns, MD  Anesthesiologist:                  Renold Don, MD  Echocardiographer:              Sanda Klein, MD  Pre-operative Echo Findings: ? Severe aortic stenosis ? Normal left ventricular systolic function  Post-operative Echo Findings: ? Mild paravalvular leak ? Normal left ventricular systolic function ______________   Patient Profile     [name] [name] is a 73 y.o. female with a history of HTN, HLD, uncontrolled hypothyroidism, DMT2, chronic bronchitis, CKD stage III, RA and severe aortic stenosis who [facility] on [facility]10[facility]2019 for planned TAVR.  Assessment & Plan    Severe AS: s/p successful TAVR with a 23 mm Edwards Sapien 3 THV via the TF approach on 01/10/19. Post operative echo showed EF 60% with normally functioning TAVR with mean gradient 11 mmHg and mild PVL. Groin sites are stable. ECG withsinus with IRBBB (which is old) butno high grade heart block. Continue Asprin andplavix.Plan for discharge to inpatient rehab as soon as she is accepted with 1 week TOC next week in office   HTN: BP elevated. Resume home amlodipine   DMT2: continue SSI   Hypothyroidism: continue synthroid  CKD: creat labile. Home diuretic on hold. Much improved today at 1.55. Will follow as an outpatient  Dispo: after TAVR she was unable to walk on her own. Will get a neuro consult to rule out stroke. My neuro exam was normal. If neurology thinks she is okay from their standpoint she is cleared for discharge to inpatient rehab. She is  refusing SNF.   Signed, Angelena Form, PA-C  01/13/2019, 7:58 AM  Pager [phone]  I have personally seen and examined this patient with Angelena Form, PA-C. I agree with the assessment and plan as outlined above. [name] [name] cardiac issues are stable. She is ready for  d/c. She is now stating that she cannot walk. It sounds like she has had issues with stability at home for years. She says her "knees buckle" when she stands. Leg strength is normal on exam. This does not seem like typical findings with a stroke. We will ask neurology to see her today. If cleared, she will go to inpatient rehab. She could go home from a heart standpoint. Her inpatient rehab admission is because of her complaints of lower extremity weakness and knees buckling.   Lauree Chandler 01/13/2019 9:45 AM

## 2019-01-13 NOTE — Progress Notes (Signed)
Physical Therapy Treatment Patient Details Name: [name] [name] MRN: [id number] DOB: January 21, 1946 Today's Date: 01/13/2019    History of Present Illness Pt is a 73 y.o. F with significant PMH of CHF, DM2, CKD, RA who is now s/p transcatheter aortic valve replacement.      PT Comments    Pt with no functional change from yesterday (1/30); still displaying bilateral lower extremity myoclonic jerking when weightbearing, causing further ambulation to be deferred. Upon further testing, pt with decreased right lower extremity sensation, bilateral upper extremity dysmetria, and decreased fine motor coordination. Requiring two person mod-maximal assistance for transfers. Noted neurology following and recommending MRI. D/c plan remains appropriate.     Follow Up Recommendations  CIR;Supervision/Assistance - 24 hour     Equipment Recommendations  None recommended by PT    Recommendations for Other Services Rehab consult;OT consult     Precautions / Restrictions Precautions Precautions: Fall Restrictions Weight Bearing Restrictions: No    Mobility  Bed Mobility Overal bed mobility: Needs Assistance Bed Mobility: Supine to Sit       Sit to supine: Min guard      Transfers Overall transfer level: Needs assistance Equipment used: Rolling walker (2 wheeled);None Transfers: Sit to/from [facility] Sit to Stand: +2 physical assistance;Max assist Stand pivot transfers: Mod assist;+2 physical assistance       General transfer comment: Attempted to stand with walker but pt with bilateral knees buckling causing maxA to safely progress back onto bed. Performed stand pivot transfer with modA + 2 with bilateral knee block  Ambulation/Gait             General Gait Details: deferred   Stairs             Wheelchair Mobility    Modified Rankin (Stroke Patients Only)       Balance Overall balance assessment: Needs assistance Sitting-balance support:  Feet supported Sitting balance-Leahy Scale: Fair     Standing balance support: Bilateral upper extremity supported Standing balance-Leahy Scale: Zero                              Cognition Arousal/Alertness: Awake/alert Behavior During Therapy: Flat affect Overall Cognitive Status: Impaired/Different from baseline Area of Impairment: Safety/judgement                         Safety/Judgement: Decreased awareness of deficits     General Comments: Pt seems drowsy today, having difficulty keeping her eyes open      Exercises General Exercises - Lower Extremity Long Arc Quad: Both;Seated;10 reps;Other (comment)(manual resistance provided) Hip Flexion/Marching: Both;Seated;20 reps Other Exercises Other Exercises: Bilateral upper extremity coordination exercise with chin to finger with varying targets    General Comments        Pertinent Vitals/Pain Pain Assessment: No/denies pain    Home Living Family/patient expects to be discharged to:: Private residence Living Arrangements: Children(daughter) Available Help at Discharge: Family;Available 24 hours/day Type of Home: House Home Access: Stairs to enter Entrance Stairs-Rails: None Home Layout: One level Home Equipment: Environmental consultant - 4 wheels;Cane - quad;Cane - single point;Shower seat Additional Comments: Reports history of falls once per week     Prior Function Level of Independence: Independent with assistive device(s)      Comments: uses Rollator   PT Goals (current goals can now be found in the care plan section) Acute Rehab PT Goals Patient Stated Goal: "I was  excited to go home." PT Goal Formulation: With patient Time For Goal Achievement: 01/26/19 Potential to Achieve Goals: Good Progress towards PT goals: Progressing toward goals    Frequency    Min 3X/week      PT Plan Current plan remains appropriate    Co-evaluation              AM-PAC PT "6 Clicks" Mobility   Outcome  Measure  Help needed turning from your back to your side while in a flat bed without using bedrails?: None Help needed moving from lying on your back to sitting on the side of a flat bed without using bedrails?: A Little Help needed moving to and from a bed to a chair (including a wheelchair)?: A Lot Help needed standing up from a chair using your arms (e.g., wheelchair or bedside chair)?: A Lot Help needed to walk in hospital room?: A Lot Help needed climbing 3-5 steps with a railing? : Total 6 Click Score: 14    End of Session Equipment Utilized During Treatment: Gait belt Activity Tolerance: Patient tolerated treatment well Patient left: in chair;with call bell/phone within reach;with chair alarm set;with nursing/sitter in room Nurse Communication: Mobility status PT Visit Diagnosis: Unsteadiness on feet (R26.81);History of falling (Z91.81);Difficulty in walking, not elsewhere classified (R26.2)     Time: 0601-5615 PT Time Calculation (min) (ACUTE ONLY): 18 min  Charges:  $Therapeutic Exercise: 8-22 mins                    Ellamae Sia, PT, DPT Acute Rehabilitation Services Pager [phone] Office [phone]   Willy Eddy 01/13/2019, 11:41 AM

## 2019-01-14 ENCOUNTER — Inpatient Hospital Stay (HOSPITAL_COMMUNITY): Payer: Medicare Other

## 2019-01-14 DIAGNOSIS — N183 Chronic kidney disease, stage 3 (moderate): Secondary | ICD-10-CM

## 2019-01-14 DIAGNOSIS — E1142 Type 2 diabetes mellitus with diabetic polyneuropathy: Secondary | ICD-10-CM

## 2019-01-14 DIAGNOSIS — I5032 Chronic diastolic (congestive) heart failure: Secondary | ICD-10-CM

## 2019-01-14 DIAGNOSIS — E7849 Other hyperlipidemia: Secondary | ICD-10-CM | POA: Diagnosis not present

## 2019-01-14 DIAGNOSIS — I639 Cerebral infarction, unspecified: Secondary | ICD-10-CM

## 2019-01-14 DIAGNOSIS — I10 Essential (primary) hypertension: Secondary | ICD-10-CM

## 2019-01-14 LAB — BASIC METABOLIC PANEL
Anion gap: 13 (ref 5–15)
BUN: 19 mg/dL (ref 8–23)
CO2: 26 mmol/L (ref 22–32)
Calcium: 8.7 mg/dL — ABNORMAL LOW (ref 8.9–10.3)
Chloride: 100 mmol/L (ref 98–111)
Creatinine, Ser: 1.37 mg/dL — ABNORMAL HIGH (ref 0.44–1.00)
GFR calc Af Amer: 44 mL/min — ABNORMAL LOW (ref >60)
GFR, EST NON AFRICAN AMERICAN: 38 mL/min — AB (ref >60)
Glucose, Bld: 194 mg/dL — ABNORMAL HIGH (ref 70–99)
Potassium: 3.9 mmol/L (ref 3.5–5.1)
SODIUM: 139 mmol/L (ref 135–145)

## 2019-01-14 LAB — LIPID PANEL
Cholesterol: 163 mg/dL (ref 0–200)
HDL: 47 mg/dL
LDL Cholesterol: 89 mg/dL (ref 0–99)
Total CHOL/HDL Ratio: 3.5 ratio
Triglycerides: 133 mg/dL
VLDL: 27 mg/dL (ref 0–40)

## 2019-01-14 LAB — CBC
HCT: 32.6 % — ABNORMAL LOW (ref 36.0–46.0)
Hemoglobin: 10.6 g/dL — ABNORMAL LOW (ref 12.0–15.0)
MCH: 28.3 pg (ref 26.0–34.0)
MCHC: 32.5 g/dL (ref 30.0–36.0)
MCV: 86.9 fL (ref 80.0–100.0)
PLATELETS: 182 10*3/uL (ref 150–400)
RBC: 3.75 MIL/uL — ABNORMAL LOW (ref 3.87–5.11)
RDW: 14.8 % (ref 11.5–15.5)
WBC: 10.9 10*3/uL — ABNORMAL HIGH (ref 4.0–10.5)
nRBC: 0 % (ref 0.0–0.2)

## 2019-01-14 LAB — GLUCOSE, CAPILLARY
GLUCOSE-CAPILLARY: 233 mg/dL — AB (ref 70–99)
Glucose-Capillary: 196 mg/dL — ABNORMAL HIGH (ref 70–99)
Glucose-Capillary: 218 mg/dL — ABNORMAL HIGH (ref 70–99)
Glucose-Capillary: 225 mg/dL — ABNORMAL HIGH (ref 70–99)

## 2019-01-14 LAB — HEMOGLOBIN A1C
Hgb A1c MFr Bld: 9.5 % — ABNORMAL HIGH (ref 4.8–5.6)
Mean Plasma Glucose: 225.95 mg/dL

## 2019-01-14 MED ORDER — GLIPIZIDE 10 MG PO TABS
10.0000 mg | ORAL_TABLET | Freq: Every day | ORAL | Status: DC
  Administered 2019-01-15 – 2019-01-16 (×2): 10 mg via ORAL
  Filled 2019-01-14 (×2): qty 1

## 2019-01-14 MED ORDER — ALBUTEROL SULFATE (2.5 MG/3ML) 0.083% IN NEBU
2.5000 mg | INHALATION_SOLUTION | Freq: Four times a day (QID) | RESPIRATORY_TRACT | Status: DC | PRN
  Administered 2019-01-14 – 2019-01-15 (×3): 2.5 mg via RESPIRATORY_TRACT
  Filled 2019-01-14 (×3): qty 3

## 2019-01-14 MED ORDER — METFORMIN HCL 500 MG PO TABS
500.0000 mg | ORAL_TABLET | Freq: Two times a day (BID) | ORAL | Status: DC
  Administered 2019-01-16 (×2): 500 mg via ORAL
  Filled 2019-01-14 (×3): qty 1

## 2019-01-14 MED ORDER — SIMVASTATIN 20 MG PO TABS
40.0000 mg | ORAL_TABLET | Freq: Every day | ORAL | Status: DC
  Administered 2019-01-14 – 2019-01-15 (×2): 40 mg via ORAL
  Filled 2019-01-14 (×2): qty 2

## 2019-01-14 NOTE — Progress Notes (Addendum)
STROKE TEAM PROGRESS NOTE   HISTORY OF PRESENT ILLNESS (per record) [name] [name] is a 73 y.o. female admitted for TAVR for aortic stenosis with abnormal movements and difficulty walking.  She states that at home, she will occasionally drop things, she has to hold her coffee cups on top of her stomach to keep from dropping things.  Since admission, however, she has had prominent difficulty with walking with her legs just suddenly giving way.  She has also had problems with her hands and dropping things.  She normally takes gabapentin 300 mg twice daily, but since admission has been getting 600 mg 3 times daily and her creatinine has increased from baseline around 1.2 - 1.4 to 2.0 in house, eGFR of 23 yesterday.    SUBJECTIVE (INTERVAL HISTORY) Her nurse is at bedside. No events overnight. No new complaints. Asterixis  Resolved.    OBJECTIVE Vitals:   01/14/19 0439 01/14/19 0602 01/14/19 0800 01/14/19 0834  BP: (!) 160/61 (!) 158/102 (!) 148/62 (!) 168/76  Pulse: 86 91 82 93  Resp: (!) _0 (!) 21  Temp: 98.6 F (37 C) 97.8 F (36.6 C) 98.3 F (36.8 C)   TempSrc: Oral Axillary Oral   SpO2: 94% 93% 92% 99%  Weight:      Height:        CBC:  Recent Labs  Lab 01/13/19 0522 01/14/19 0415  WBC 10.1 10.9*  HGB 9.9* 10.6*  HCT 30.1* 32.6*  MCV 87.0 86.9  PLT 168 497    Basic Metabolic Panel:  Recent Labs  Lab 01/11/19 0445  01/13/19 0522 01/14/19 0415  NA 137   < > 139 139  K 3.3*   < > 3.0* 3.9  CL 98   < > 99 100  CO2 28   < > 29 26  GLUCOSE 94   < > 190* 194*  BUN 24*   < > 25* 19  CREATININE 1.76*   < > 1.55* 1.37*  CALCIUM 8.2*   < > 8.6* 8.7*  MG 2.3  --   --   --    < > = values in this interval not displayed.    Lipid Panel:     Component Value Date/Time   CHOL 163 01/14/2019 0415   TRIG 133 01/14/2019 0415   HDL 47 01/14/2019 0415   CHOLHDL 3.5 01/14/2019 0415   VLDL 27 01/14/2019 0415   LDLCALC 89 01/14/2019 0415   HgbA1c:  Lab Results   Component Value Date   HGBA1C 9.5 (H) 01/14/2019   Urine Drug Screen: No results found for: LABOPIA, COCAINSCRNUR, LABBENZ, AMPHETMU, THCU, LABBARB  Alcohol Level No results found for: ETH  IMAGING   Ct Angio Head W Or Wo Contrast Ct Angio Neck W Or Wo Contrast 01/13/2019 IMPRESSION:   CTA NECK:  1. No hemodynamically significant stenosis ICA's. Patent vertebral arteries.   CTA HEAD:  1. No emergent large vessel occlusion.  2. Advanced intracranial atherosclerosis with moderate to severe RIGHT and moderate LEFT ICA stenosis.     Mr Brain Wo Contrast 01/13/2019 IMPRESSION:  At least 10 small areas of acute infarct in both cerebral hemispheres most compatible with cerebral emboli. Atrophy and mild chronic microvascular ischemia. Chronic microhemorrhage in the pons likely due to hypertension.     Transthoracic Echocardiogram  00/00/00 Pending     PHYSICAL EXAM Blood pressure (!) 168/76, pulse 93, temperature 98.3 F (36.8 C), temperature source Oral, resp. rate (!) 21, height _1  (  1.448 m), weight 64.9 kg, SpO2 99 %.   Exam: NAD, pleasant                  Speech:    Speech is without aphasia or dysarthria. Poor attention and concentration. Impaired fund of knowledge, impaired recent and remote memory. Cognition:    The patient is oriented to person, place, and time;     recent and remote memory impaired;     language fluent;    Cranial Nerves:    The pupils are equal, round, and reactive to light.Trigeminal sensation is intact and the muscles of mastication are normal. The face is symmetric. The palate elevates in the midline. Hearing intact. Voice is normal. Shoulder shrug is normal. The tongue has normal motion without fasciculations.   Coordination:  No dysmetria  Motor Observation:    No asymmetry, no atrophy, and no involuntary movements noted. Tone:    Normal muscle tone.     Strength:    Strength is V/V in the upper and lower limbs.       Sensation: intact to LT  ASSESSMENT/PLAN [name] [name] is a 73 y.o. female with history of Htn, CHF, CKD, DM, Hld, anemia, TAVR for severe aortic stenosis on 01/10/2019 presenting with abnormal movements, difficulty walking and dropping things. She did not receive IV t-PA due to minimal deficits.  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  Resultant  Deficits resolved  MRI head - At least 10 small areas of acute infarct in both cerebral hemispheres most compatible with cerebral emboli.  MRA head - not performed  CTA H&N - Advanced intracranial atherosclerosis with moderate to severe RIGHT and moderate LEFT ICA stenosis.   Carotid Doppler - CTA neck performed - carotid dopplers not indicated.  2D Echo - pending  LDL - 89  HgbA1c - 9.5  UDS - not performed  VTE prophylaxis - SCDs  Diet  - Heart healthy with thin liquids.  aspirin 81 mg daily prior to admission, now on aspirin 81 mg daily and clopidogrel 75 mg daily  Patient counseled to be compliant with her antithrombotic medications  Ongoing aggressive stroke risk factor management  Therapy recommendations:  pending  Disposition:  Pending  Hypertension  Stable . Permissive hypertension (OK if < 220/120) but gradually normalize in 5-7 days . Long-term BP goal normotensive  Hyperlipidemia  Lipid lowering medication PTA: Zocor 20 mg daily  LDL 89, goal < 70  Current lipid lowering medication: Zocor 20 mg daily -> increase Zocor to 40 mg daily  Continue statin at discharge  Diabetes  HgbA1c 9.5, goal < 7.0  Uncontrolled  Other Stroke Risk Factors  Advanced age  Obesity, Body mass index is 30.96 kg/m., recommend weight loss, diet and exercise as appropriate.   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 Procedure related? Will discuss with Cardiology)    Hospital day # 4  Personally examined patient and images, and have participated in and made any corrections needed to history, physical, neuro exam,assessment and plan as stated above.  I have personally obtained the history, evaluated lab date, reviewed imaging studies and agree with radiology interpretations.    Sarina Ill, MD Stroke Neurology   A total of 35 minutes was spent for the care of this patient,  spent on counseling patient or family on different diagnostic and therapeutic options, counseling and coordination of care, riskd ans benefits of management, compliance, or risk factor reduction and education.       To contact Stroke Continuity provider, please refer to [email or web address] After hours, contact General Neurology

## 2019-01-14 NOTE — Progress Notes (Signed)
Physical Therapy Treatment Patient Details Name: [name] [name] MRN: [id number] DOB: 22-Feb-1946 Today's Date: 01/14/2019    History of Present Illness Pt is a 73 y.o. F with significant PMH of CHF, DM2, CKD, RA who is now s/p transcatheter aortic valve replacement. 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      PT Comments    Patient with significant improvement in mobility with resolved "jerking," of lower extremities this session, allowing progression to ambulation. Pt requiring min assist for all aspects of functional mobility. Continues with decreased dynamic balance, cognition, coordination. Pt has poor safety awareness and decreased gait speed, indicating high fall risk. CIR remains appropriate.    Follow Up Recommendations  CIR;Supervision/Assistance - 24 hour     Equipment Recommendations  None recommended by PT    Recommendations for Other Services       Precautions / Restrictions Precautions Precautions: Fall Restrictions Weight Bearing Restrictions: No    Mobility  Bed Mobility Overal bed mobility: Needs Assistance Bed Mobility: Supine to Sit     Supine to sit: Min guard;HOB elevated     General bed mobility comments: Min Guard A for safety  Transfers Overall transfer level: Needs assistance Equipment used: Rolling walker (2 wheeled);None Transfers: Sit to/from Stand Sit to Stand: Min assist         General transfer comment: Min A to power up to standing  Ambulation/Gait Ambulation/Gait assistance: Min assist Gait Distance (Feet): 200 Feet Assistive device: Rolling walker (2 wheeled);None Gait Pattern/deviations: Step-through pattern;Decreased stride length;Drifts right/left Gait velocity: decr Gait velocity interpretation: <1.8 ft/sec, indicate of risk for recurrent falls General Gait Details: Pt tending to drift right and left and easily distracted. MinA for balance   Stairs              Wheelchair Mobility    Modified Rankin (Stroke Patients Only)       Balance Overall balance assessment: Needs assistance Sitting-balance support: Feet supported Sitting balance-Leahy Scale: Fair     Standing balance support: Bilateral upper extremity supported Standing balance-Leahy Scale: Poor Standing balance comment: Reliant on physical A                            Cognition Arousal/Alertness: Awake/alert Behavior During Therapy: Flat affect Overall Cognitive Status: Impaired/Different from baseline Area of Impairment: Safety/judgement;Problem solving;Awareness;Following commands;Attention                   Current Attention Level: Selective   Following Commands: Follows one step commands with increased time Safety/Judgement: Decreased awareness of deficits Awareness: Emergent Problem Solving: Slow processing;Requires verbal cues General Comments: Pt demonstrating decreased safety awareness and problem solving. Requiring increased time and cues. Pt also demonstrating increased arousal and joking along with therapist. Pt unable to recall face of PT from PT eval yesterday      Exercises      General Comments General comments (skin integrity, edema, etc.): HR 101, SpO2 97% on RA      Pertinent Vitals/Pain Pain Assessment: No/denies pain    Home Living                      Prior Function            PT Goals (current goals can now be found in the care plan section) Acute Rehab PT Goals Patient Stated Goal: "I was excited to go home." Potential to Achieve Goals: Good Progress  towards PT goals: Progressing toward goals    Frequency    Min 4X/week      PT Plan Current plan remains appropriate    Co-evaluation PT/OT/SLP Co-Evaluation/Treatment: Yes Reason for Co-Treatment: For patient/therapist safety   OT goals addressed during session: ADL's and self-care      AM-PAC PT "6 Clicks" Mobility   Outcome Measure   Help needed turning from your back to your side while in a flat bed without using bedrails?: None Help needed moving from lying on your back to sitting on the side of a flat bed without using bedrails?: A Little Help needed moving to and from a bed to a chair (including a wheelchair)?: A Little Help needed standing up from a chair using your arms (e.g., wheelchair or bedside chair)?: A Little Help needed to walk in hospital room?: A Little Help needed climbing 3-5 steps with a railing? : A Lot 6 Click Score: 18    End of Session Equipment Utilized During Treatment: Gait belt Activity Tolerance: Patient tolerated treatment well Patient left: in chair;with call bell/phone within reach;with chair alarm set Nurse Communication: Mobility status PT Visit Diagnosis: Unsteadiness on feet (R26.81);History of falling (Z91.81);Difficulty in walking, not elsewhere classified (R26.2)     Time: 1610-9604 PT Time Calculation (min) (ACUTE ONLY): 23 min  Charges:  $Therapeutic Activity: 8-22 mins                     Ellamae Sia, PT, DPT Acute Rehabilitation Services Pager [phone] Office [phone]   Willy Eddy 01/14/2019, 4:57 PM

## 2019-01-14 NOTE — Progress Notes (Signed)
Occupational Therapy Treatment Patient Details Name: [name] [name] MRN: [id number] DOB: 06/13/1946 Today's Date: 01/14/2019    History of present illness Pt is a 73 y.o. F with significant PMH of CHF, DM2, CKD, RA who is now s/p transcatheter aortic valve replacement. 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     OT comments  Pt presenting with decreased balance, cognition, coordination, and activity tolerance. Pt motivated to participate in therapy. Pt required Min A +2 for LB ADLs and functional mobility with RW. Pt with poor safety awareness and required cues throughout. Continue to recommend dc to CIR and will continue to follow acutely as admitted.    Follow Up Recommendations  CIR    Equipment Recommendations  None recommended by OT    Recommendations for Other Services Rehab consult    Precautions / Restrictions Precautions Precautions: Fall Restrictions Weight Bearing Restrictions: No       Mobility Bed Mobility Overal bed mobility: Needs Assistance Bed Mobility: Supine to Sit     Supine to sit: Min guard;HOB elevated     General bed mobility comments: Min Guard A for safety  Transfers Overall transfer level: Needs assistance Equipment used: Rolling walker (2 wheeled);None Transfers: Sit to/from Stand Sit to Stand: Min assist;+2 safety/equipment         General transfer comment: Min A +2 for safety to power up into standing.    Balance Overall balance assessment: Needs assistance Sitting-balance support: Feet supported Sitting balance-Leahy Scale: Fair     Standing balance support: Bilateral upper extremity supported Standing balance-Leahy Scale: Poor Standing balance comment: Reliant on physical A                           ADL either performed or assessed with clinical judgement   ADL Overall ADL's : Needs assistance/impaired     Grooming: Brushing hair;Supervision/safety;Set up;Sitting               Lower Body Dressing: Minimal assistance;Sit to/from stand;+2 for safety/equipment Lower Body Dressing Details (indicate cue type and reason): Min A for sitting balance as pt donned socks. Pt bringing her ankles to the EOB. Pt with posterior lean. Pt requiring signficiant time and presenting with decreased FM skills and hand strength Toilet Transfer: Minimal assistance;+2 for safety/equipment;Ambulation;RW(simulated to recliner)           Functional mobility during ADLs: Minimal assistance;+2 for safety/equipment;Cueing for safety;Rolling walker General ADL Comments: Pt presenting with increased arousal and able to participate in therapy. Continues to present with fall risk and poor functional performance.      Vision       Perception     Praxis      Cognition Arousal/Alertness: Awake/alert Behavior During Therapy: Flat affect Overall Cognitive Status: Impaired/Different from baseline Area of Impairment: Safety/judgement;Problem solving;Awareness;Following commands;Attention                   Current Attention Level: Selective   Following Commands: Follows one step commands with increased time Safety/Judgement: Decreased awareness of deficits Awareness: Emergent Problem Solving: Slow processing;Requires verbal cues General Comments: Pt demosntrating decreased safety awareness and problem solving. Requiring increased time and cues. Pt also demonstrating uncreased arousal and joking along with therapist. Pt unable to recall face of PT from PT eval yesterday        Exercises     Shoulder Instructions       General Comments HR 101, SpO2 97% on  RA    Pertinent Vitals/ Pain       Pain Assessment: No/denies pain  Home Living                                          Prior Functioning/Environment              Frequency  Min 2X/week        Progress Toward Goals  OT Goals(current goals can now be found in the care plan  section)  Progress towards OT goals: Progressing toward goals  Acute Rehab OT Goals Patient Stated Goal: "I was excited to go home." OT Goal Formulation: With patient Time For Goal Achievement: 01/27/19 Potential to Achieve Goals: Good  Plan Discharge plan remains appropriate    Co-evaluation    PT/OT/SLP Co-Evaluation/Treatment: Yes Reason for Co-Treatment: For patient/therapist safety;To address functional/ADL transfers   OT goals addressed during session: ADL's and self-care      AM-PAC OT "6 Clicks" Daily Activity     Outcome Measure   Help from another person eating meals?: A Little Help from another person taking care of personal grooming?: A Little Help from another person toileting, which includes using toliet, bedpan, or urinal?: A Little Help from another person bathing (including washing, rinsing, drying)?: A Lot Help from another person to put on and taking off regular upper body clothing?: A Lot Help from another person to put on and taking off regular lower body clothing?: A Little 6 Click Score: 16    End of Session Equipment Utilized During Treatment: Gait belt;Rolling walker  OT Visit Diagnosis: Unsteadiness on feet (R26.81);Repeated falls (R29.6);Muscle weakness (generalized) (M62.81);History of falling (Z91.81)   Activity Tolerance Patient tolerated treatment well   Patient Left in chair;with call bell/phone within reach;with chair alarm set   Nurse Communication Mobility status        Time: 1342-1405 OT Time Calculation (min): 23 min  Charges: OT General Charges $OT Visit: 1 Visit OT Treatments $Self Care/Home Management : 8-22 mins  [place], OTR/L Acute Rehab Pager: [phone] Office: [place] 01/14/2019, 4:21 PM

## 2019-01-14 NOTE — Progress Notes (Signed)
Stroke book given

## 2019-01-14 NOTE — Procedures (Signed)
Patient has just had recent echo 3 days ago and a complete echo 4 days ago.   Please contact echo team [phone] or dial operator) if repeat echo is necessary and indicate if it can be a LIMITED echo to look for something specific.

## 2019-01-14 NOTE — Progress Notes (Signed)
Progress Note  Patient Name: [name] [name] Date of Encounter: 01/14/2019  Primary Cardiologist: Kate Sable, MD   Subjective   Speaking fluently this morning.  Has not walked, but able to transfer from bed to bedside commode without much difficulty. No cardiovascular complaints. MRI shows multiple tiny scattered acute embolic strokes.  Inpatient Medications    Scheduled Meds: .  stroke: mapping our early stages of recovery book   Does not apply Once  . amLODipine  10 mg Oral QHS  . aspirin EC  81 mg Oral Daily  . carvedilol  6.25 mg Oral BID WC  . clopidogrel  75 mg Oral Q breakfast  . ferrous sulfate  325 mg Oral QODAY  . fluticasone  1 spray Each Nare Daily  . gabapentin  200 mg Oral BID  . insulin aspart  0-24 Units Subcutaneous TID AC & HS  . latanoprost  1 drop Both Eyes QHS  . levothyroxine  88 mcg Oral QAC breakfast  . simvastatin  20 mg Oral QHS  . sodium chloride flush  3 mL Intravenous Q12H  . timolol  1 drop Both Eyes BID   Continuous Infusions: . sodium chloride    . nitroGLYCERIN    . phenylephrine (NEO-SYNEPHRINE) Adult infusion     PRN Meds: sodium chloride, acetaminophen **OR** acetaminophen, ALPRAZolam, metoprolol tartrate, morphine injection, ondansetron (ZOFRAN) IV, oxyCODONE, sodium chloride flush, traMADol   Vital Signs    Vitals:   01/14/19 0439 01/14/19 0602 01/14/19 0800 01/14/19 0834  BP: (!) 160/61 (!) 158/102 (!) 148/62 (!) 168/76  Pulse: 86 91 82 93  Resp: (!) 26 20 19  (!) 21  Temp: 98.6 F (37 C) 97.8 F (36.6 C) 98.3 F (36.8 C)   TempSrc: Oral Axillary Oral   SpO2: 94% 93% 92% 99%  Weight:      Height:        Intake/Output Summary (Last 24 hours) at 01/14/2019 1141 Last data filed at 01/14/2019 0824 Gross per 24 hour  Intake 480 ml  Output 1250 ml  Net -770 ml   Last 3 Weights 01/14/2019 01/13/2019 01/11/2019  Weight (lbs) 143 lb 1.3 oz 143 lb 15.4 oz 145 lb 4.8 oz  Weight (kg) 64.9 kg 65.3 kg 65.908 kg       Telemetry    Sinus rhythm- Personally Reviewed  ECG    No new tracing- Personally Reviewed  Physical Exam  Obese, lying fully supine in bed without any breathing difficulty GEN: No acute distress.   Neck: No JVD Cardiac: RRR, no murmurs, rubs, or gallops.  Respiratory: Clear to auscultation bilaterally. GI: Soft, nontender, non-distended  MS: No edema; No deformity.  No problems at groin access sites Neuro:  Nonfocal  Psych: Normal affect   Labs    Chemistry Recent Labs  Lab 01/09/19 1520  01/12/19 0456 01/13/19 0522 01/14/19 0415  NA 134*   < > 137 139 139  K 3.2*   < > 3.5 3.0* 3.9  CL 96*   < > 98 99 100  CO2 24   < > 30 29 26   GLUCOSE 266*   < > 130* 190* 194*  BUN 22   < > 29* 25* 19  CREATININE 1.68*   < > 2.08* 1.55* 1.37*  CALCIUM 9.2   < > 7.9* 8.6* 8.7*  PROT 7.3  --   --   --   --   ALBUMIN 3.9  --   --   --   --  AST 17  --   --   --   --   ALT 14  --   --   --   --   ALKPHOS 74  --   --   --   --   BILITOT 0.9  --   --   --   --   GFRNONAA 30*   < > 23* 33* 38*  GFRAA 35*   < > 27* 38* 44*  ANIONGAP 14   < > 9 11 13    < > = values in this interval not displayed.     Hematology Recent Labs  Lab 01/12/19 0456 01/13/19 0522 01/14/19 0415  WBC 8.5 10.1 10.9*  RBC 3.21* 3.46* 3.75*  HGB 9.2* 9.9* 10.6*  HCT 28.6* 30.1* 32.6*  MCV 89.1 87.0 86.9  MCH 28.7 28.6 28.3  MCHC 32.2 32.9 32.5  RDW 14.8 15.0 14.8  PLT 160 168 182    Cardiac EnzymesNo results for input(s): TROPONINI in the last 168 hours. No results for input(s): TROPIPOC in the last 168 hours.   BNP Recent Labs  Lab 01/09/19 1520  BNP 129.6*     DDimer No results for input(s): DDIMER in the last 168 hours.   Radiology    Ct Angio Head W Or Wo Contrast  Result Date: 01/13/2019 CLINICAL DATA:  Follow up stroke. Status post aortic valve replacement January 02, 2019. History of hypertension, hyperlipidemia. EXAM: CT ANGIOGRAPHY HEAD AND NECK TECHNIQUE: Multidetector CT  imaging of the head and neck was performed using the standard protocol during bolus administration of intravenous contrast. Multiplanar CT image reconstructions and MIPs were obtained to evaluate the vascular anatomy. Carotid stenosis measurements (when applicable) are obtained utilizing NASCET criteria, using the distal internal carotid diameter as the denominator. CONTRAST:  133mL ISOVUE-370 IOPAMIDOL (ISOVUE-370) INJECTION 76% COMPARISON:  MRI head January 13, 2019 FINDINGS: CTA NECK FINDINGS: AORTIC ARCH: 2 vessel arch is a normal variant. Mild calcific atherosclerosis. Mild stenosis RIGHT subclavian artery due to calcific atherosclerosis. The origins of the innominate, left Common carotid artery and subclavian artery are widely patent. RIGHT CAROTID SYSTEM: Common carotid artery is patent. Mild calcific atherosclerosis of the carotid bifurcation without hemodynamically significant stenosis by NASCET criteria. Normal appearance of the internal carotid artery. LEFT CAROTID SYSTEM: Common carotid artery is patent. Mild calcific atherosclerosis of the carotid bifurcation without hemodynamically significant stenosis by NASCET criteria. Normal appearance of the internal carotid artery. VERTEBRAL ARTERIES:Codominant vertebral arteries. Normal appearance of the vertebral arteries, widely patent. SKELETON: No acute osseous process though bone windows have not been submitted. Severe mid cervical spondylosis, grade 1 C3-4 anterolisthesis. OTHER NECK: Soft tissues of the neck are nonacute though, not tailored for evaluation. UPPER CHEST: Mosaic RIGHT lung attenuation seen with small airway disease. CTA HEAD FINDINGS: ANTERIOR CIRCULATION: Patent cervical internal carotid arteries, petrous, cavernous and supra clinoid internal carotid arteries. Severe calcific atherosclerosis carotid siphons with moderate LEFT and possibly RIGHT paraclinoid stenosis. Patent anterior communicating artery. Patent anterior and middle cerebral  arteries, moderate luminal irregularity compatible with atherosclerosis. No large vessel occlusion, contrast extravasation or aneurysm. POSTERIOR CIRCULATION: Patent vertebral arteries, vertebrobasilar junction and basilar artery, as well as main branch vessels. Mild calcific atherosclerosis RIGHT V4 segment. Patent posterior cerebral arteries, moderate luminal irregularity compatible with atherosclerosis. No large vessel occlusion, significant stenosis, contrast extravasation or aneurysm. VENOUS SINUSES: Major dural venous sinuses are patent though not tailored for evaluation on this angiographic examination. ANATOMIC VARIANTS: None. DELAYED PHASE: No abnormal intracranial enhancement.  MIP images reviewed. IMPRESSION: CTA NECK: 1. No hemodynamically significant stenosis ICA's. Patent vertebral arteries. CTA HEAD: 1. No emergent large vessel occlusion. 2. Advanced intracranial atherosclerosis with moderate to severe RIGHT and moderate LEFT ICA stenosis. Electronically Signed   By: Elon Alas M.D.   On: 01/13/2019 22:13   Ct Angio Neck W Or Wo Contrast  Result Date: 01/13/2019 CLINICAL DATA:  Follow up stroke. Status post aortic valve replacement January 02, 2019. History of hypertension, hyperlipidemia. EXAM: CT ANGIOGRAPHY HEAD AND NECK TECHNIQUE: Multidetector CT imaging of the head and neck was performed using the standard protocol during bolus administration of intravenous contrast. Multiplanar CT image reconstructions and MIPs were obtained to evaluate the vascular anatomy. Carotid stenosis measurements (when applicable) are obtained utilizing NASCET criteria, using the distal internal carotid diameter as the denominator. CONTRAST:  18mL ISOVUE-370 IOPAMIDOL (ISOVUE-370) INJECTION 76% COMPARISON:  MRI head January 13, 2019 FINDINGS: CTA NECK FINDINGS: AORTIC ARCH: 2 vessel arch is a normal variant. Mild calcific atherosclerosis. Mild stenosis RIGHT subclavian artery due to calcific atherosclerosis.  The origins of the innominate, left Common carotid artery and subclavian artery are widely patent. RIGHT CAROTID SYSTEM: Common carotid artery is patent. Mild calcific atherosclerosis of the carotid bifurcation without hemodynamically significant stenosis by NASCET criteria. Normal appearance of the internal carotid artery. LEFT CAROTID SYSTEM: Common carotid artery is patent. Mild calcific atherosclerosis of the carotid bifurcation without hemodynamically significant stenosis by NASCET criteria. Normal appearance of the internal carotid artery. VERTEBRAL ARTERIES:Codominant vertebral arteries. Normal appearance of the vertebral arteries, widely patent. SKELETON: No acute osseous process though bone windows have not been submitted. Severe mid cervical spondylosis, grade 1 C3-4 anterolisthesis. OTHER NECK: Soft tissues of the neck are nonacute though, not tailored for evaluation. UPPER CHEST: Mosaic RIGHT lung attenuation seen with small airway disease. CTA HEAD FINDINGS: ANTERIOR CIRCULATION: Patent cervical internal carotid arteries, petrous, cavernous and supra clinoid internal carotid arteries. Severe calcific atherosclerosis carotid siphons with moderate LEFT and possibly RIGHT paraclinoid stenosis. Patent anterior communicating artery. Patent anterior and middle cerebral arteries, moderate luminal irregularity compatible with atherosclerosis. No large vessel occlusion, contrast extravasation or aneurysm. POSTERIOR CIRCULATION: Patent vertebral arteries, vertebrobasilar junction and basilar artery, as well as main branch vessels. Mild calcific atherosclerosis RIGHT V4 segment. Patent posterior cerebral arteries, moderate luminal irregularity compatible with atherosclerosis. No large vessel occlusion, significant stenosis, contrast extravasation or aneurysm. VENOUS SINUSES: Major dural venous sinuses are patent though not tailored for evaluation on this angiographic examination. ANATOMIC VARIANTS: None. DELAYED  PHASE: No abnormal intracranial enhancement. MIP images reviewed. IMPRESSION: CTA NECK: 1. No hemodynamically significant stenosis ICA's. Patent vertebral arteries. CTA HEAD: 1. No emergent large vessel occlusion. 2. Advanced intracranial atherosclerosis with moderate to severe RIGHT and moderate LEFT ICA stenosis. Electronically Signed   By: Elon Alas M.D.   On: 01/13/2019 22:13   Mr Brain Wo Contrast  Result Date: 01/13/2019 CLINICAL DATA:  Speech difficulty. Difficulty walking. TAVR 01/10/2019 EXAM: MRI HEAD WITHOUT CONTRAST TECHNIQUE: Multiplanar, multiecho pulse sequences of the brain and surrounding structures were obtained without intravenous contrast. COMPARISON:  None. FINDINGS: Brain: Scattered small areas of acute infarct in both cerebral hemispheres. Approximately 10 small acute infarcts are identified compatible with emboli. Mild atrophy and mild chronic microvascular ischemia. Chronic hemorrhage in the central pons. Negative for hydrocephalus. Negative for mass lesion. Vascular: Normal arterial flow voids Skull and upper cervical spine: Negative Sinuses/Orbits: Mild mucosal edema throughout the paranasal sinuses. Bilateral cataract surgery Other: None IMPRESSION: At least 10  small areas of acute infarct in both cerebral hemispheres most compatible with cerebral emboli. Atrophy and mild chronic microvascular ischemia. Chronic microhemorrhage in the pons likely due to hypertension. Electronically Signed   By: Franchot Gallo M.D.   On: 01/13/2019 14:50    Cardiac Studies   Echo January 11, 2019  1. A 90mm Edwards Sapien bioprosthetic aortic valve valve is present in the aortic position. The prosthesis was placed on 01/10/2019.  2. AV Mean Grad:11.0 mmHg.  3. Mild paravalvular regurgitation, no prosthetic regurgitation. of the aortic prosthesis.  4. The left ventricle appears to be normal in size, has mild wall thickness 60-65% ejection fraction Spectral Doppler shows impaired  relaxation pattern of diastolic filling.  5. The right ventricle has normal size and normal systolic function.  6. Right ventricular systolic pressure is could not be assessed.  7. Mitral valve regurgitation is trivial by color flow Doppler.  8. MV Mean Dias grad:4.0 mmHg, HR 73 bpm.  9. Pericardium: There is no evidence of pericardial effusion.  Patient Profile     73 y.o. female with severe aortic stenosis, now day 5 status post TAVR (Edwards Sapien 3, 23 mm), CKD stage III, chronic diastolic heart failure, diabetes mellitus with neuropathy, hypertension, hyperlipidemia, rheumatoid arthritis, developed acute worsening of chronic gait instability after the procedure.  Assessment & Plan    1. AS s/p TAVR: Hemodynamic result, mild perivalvular leak.  No cardiac complaints.  No groin complications.  Plan to continue aspirin and clopidogrel. 2. HTN: Elevated systolic blood pressure, amlodipine restarted yesterday.  No plans for additional medication titration at this time. 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  None of these appear to correlate with any obvious clinical deficit, but it is possible that they are worsening her pre-existing mobility issues.  Her gait/balance challenges predate the procedure. Asterixis noted, felt to be possibly due to excessive gabapentin dosage, contributing to her balance and gait difficulties.  Multiple tiny embolic lesions not unexpected after TAVR.  Anticoagulation is not indicated.  We will need inpatient rehab,  probably will be transferred on Monday. 4. AKI on CKD 3: Creatinine continues to improve, almost back to her baseline. 5. DM: Fairly persistent hyperglycemia since the procedure.  We will restart her home glipizide and metformin.   For questions or updates, please contact [facility] Please consult [email or web address] for contact info under        Signed, Sanda Klein, MD  01/14/2019, 11:41 AM

## 2019-01-14 NOTE — Progress Notes (Signed)
Bedside swallow study done by [facility] LPN and Colletta Maryland RN at 9 pm, patient passed. Heart healthy diet order placed.

## 2019-01-14 NOTE — Progress Notes (Signed)
SLP Cancellation Note  Patient Details Name: [name] [name] MRN: [id number] DOB: [birth year]   Cancelled treatment:       Reason Eval/Treat Not Completed: Fatigue/lethargy limiting ability to participate. Patient sleeping upon arrival to room, aroused easily however lethargic and stating that she did not sleep last night, falling back to sleep quickly while talking with SLP. Will f/u 2/2.   Gabriel Rainwater MA, CCC-SLP     [name] Meryl 01/14/2019, 9:44 AM

## 2019-01-15 ENCOUNTER — Inpatient Hospital Stay (HOSPITAL_COMMUNITY): Payer: Medicare Other

## 2019-01-15 DIAGNOSIS — R892 Abnormal level of other drugs, medicaments and biological substances in specimens from other organs, systems and tissues: Secondary | ICD-10-CM

## 2019-01-15 DIAGNOSIS — N179 Acute kidney failure, unspecified: Secondary | ICD-10-CM

## 2019-01-15 DIAGNOSIS — I639 Cerebral infarction, unspecified: Secondary | ICD-10-CM

## 2019-01-15 DIAGNOSIS — I6529 Occlusion and stenosis of unspecified carotid artery: Secondary | ICD-10-CM

## 2019-01-15 LAB — CBC
HCT: 31.7 % — ABNORMAL LOW (ref 36.0–46.0)
Hemoglobin: 10.5 g/dL — ABNORMAL LOW (ref 12.0–15.0)
MCH: 29 pg (ref 26.0–34.0)
MCHC: 33.1 g/dL (ref 30.0–36.0)
MCV: 87.6 fL (ref 80.0–100.0)
Platelets: 194 10*3/uL (ref 150–400)
RBC: 3.62 MIL/uL — ABNORMAL LOW (ref 3.87–5.11)
RDW: 15 % (ref 11.5–15.5)
WBC: 10.6 10*3/uL — ABNORMAL HIGH (ref 4.0–10.5)
nRBC: 0 % (ref 0.0–0.2)

## 2019-01-15 LAB — BASIC METABOLIC PANEL
Anion gap: 13 (ref 5–15)
BUN: 18 mg/dL (ref 8–23)
CALCIUM: 8.8 mg/dL — AB (ref 8.9–10.3)
CO2: 25 mmol/L (ref 22–32)
Chloride: 102 mmol/L (ref 98–111)
Creatinine, Ser: 1.23 mg/dL — ABNORMAL HIGH (ref 0.44–1.00)
GFR calc Af Amer: 50 mL/min — ABNORMAL LOW (ref >60)
GFR, EST NON AFRICAN AMERICAN: 43 mL/min — AB (ref >60)
Glucose, Bld: 157 mg/dL — ABNORMAL HIGH (ref 70–99)
Potassium: 3.6 mmol/L (ref 3.5–5.1)
Sodium: 140 mmol/L (ref 135–145)

## 2019-01-15 LAB — GLUCOSE, CAPILLARY
Glucose-Capillary: 219 mg/dL — ABNORMAL HIGH (ref 70–99)
Glucose-Capillary: 224 mg/dL — ABNORMAL HIGH (ref 70–99)
Glucose-Capillary: 211 mg/dL — ABNORMAL HIGH (ref 70–99)
Glucose-Capillary: 176 mg/dL — ABNORMAL HIGH (ref 70–99)
Glucose-Capillary: 131 mg/dL — ABNORMAL HIGH (ref 70–99)

## 2019-01-15 MED ORDER — FUROSEMIDE 20 MG PO TABS
20.0000 mg | ORAL_TABLET | Freq: Every day | ORAL | Status: DC
  Administered 2019-01-15 – 2019-01-16 (×2): 20 mg via ORAL
  Filled 2019-01-15 (×2): qty 1

## 2019-01-15 NOTE — Progress Notes (Addendum)
STROKE TEAM PROGRESS NOTE   HISTORY OF PRESENT ILLNESS (per record) [name] [name] is a 73 y.o. female admitted for TAVR for aortic stenosis with abnormal movements and difficulty walking.  She states that at home, she will occasionally drop things, she has to hold her coffee cups on top of her stomach to keep from dropping things.  Since admission, however, she has had prominent difficulty with walking with her legs just suddenly giving way.  She has also had problems with her hands and dropping things.  She normally takes gabapentin 300 mg twice daily, but since admission has been getting 600 mg 3 times daily and her creatinine has increased from baseline around 1.2 - 1.4 to 2.0 in house, eGFR of 23 yesterday.    SUBJECTIVE (INTERVAL HISTORY) Her nurse is at bedside. No events overnight. No new complaints. Asterixis  Resolved. Bilateral embolic strokes after TAVR likely procedure related. I discussed and reviewed images with daughter. Discussed with Dr. Sallyanne Kuster no further TTE/TEE needed likely procedure related and deficits present prior to procedure.     OBJECTIVE Vitals:   01/14/19 2346 01/15/19 0350 01/15/19 0413 01/15/19 0540  BP: (!) 149/57 (!) 169/60  120/61  Pulse: 85 80 83 73  Resp: 16 (!) _0 Temp: 98.1 F (36.7 C) 98.3 F (36.8 C)  98.3 F (36.8 C)  TempSrc: Oral Oral  Oral  SpO2: 99% 97% 97% 94%  Weight:   63.4 kg   Height:        CBC:  Recent Labs  Lab 01/14/19 0415 01/15/19 0254  WBC 10.9* 10.6*  HGB 10.6* 10.5*  HCT 32.6* 31.7*  MCV 86.9 87.6  PLT 182 591    Basic Metabolic Panel:  Recent Labs  Lab 01/11/19 0445  01/14/19 0415 01/15/19 0254  NA 137   < > 139 140  K 3.3*   < > 3.9 3.6  CL 98   < > 100 102  CO2 28   < > 26 25  GLUCOSE 94   < > 194* 157*  BUN 24*   < > 19 18  CREATININE 1.76*   < > 1.37* 1.23*  CALCIUM 8.2*   < > 8.7* 8.8*  MG 2.3  --   --   --    < > = values in this interval not displayed.    Lipid Panel:      Component Value Date/Time   CHOL 163 01/14/2019 0415   TRIG 133 01/14/2019 0415   HDL 47 01/14/2019 0415   CHOLHDL 3.5 01/14/2019 0415   VLDL 27 01/14/2019 0415   LDLCALC 89 01/14/2019 0415   HgbA1c:  Lab Results  Component Value Date   HGBA1C 9.5 (H) 01/14/2019   Urine Drug Screen: No results found for: LABOPIA, COCAINSCRNUR, LABBENZ, AMPHETMU, THCU, LABBARB  Alcohol Level No results found for: ETH  IMAGING   Ct Angio Head W Or Wo Contrast Ct Angio Neck W Or Wo Contrast 01/13/2019 IMPRESSION:   CTA NECK:  1. No hemodynamically significant stenosis ICA's. Patent vertebral arteries.   CTA HEAD:  1. No emergent large vessel occlusion.  2. Advanced intracranial atherosclerosis with moderate to severe RIGHT and moderate LEFT ICA stenosis.     Mr Brain Wo Contrast 01/13/2019 IMPRESSION:  At least 10 small areas of acute infarct in both cerebral hemispheres most compatible with cerebral emboli. Atrophy and mild chronic microvascular ischemia. Chronic microhemorrhage in the pons likely due to hypertension.     Transthoracic  Echocardiogram  00/00/00 Pending     PHYSICAL EXAM Blood pressure 120/61, pulse 73, temperature 98.3 F (36.8 C), temperature source Oral, resp. rate 20, height 4' 9" (1.448 m), weight 63.4 kg, SpO2 94 %.   Exam: NAD, pleasant                  Speech:    Speech is without aphasia or dysarthria. Poor attention and concentration. Impaired fund of knowledge, impaired recent and remote memory. Cognition:    The patient is oriented to person, place, and time;     recent and remote memory impaired;     language fluent;    Cranial Nerves:    The pupils are equal, round, and reactive to light.Trigeminal sensation is intact and the muscles of mastication are normal. The face is symmetric. The palate elevates in the midline. Hearing intact. Voice is normal. Shoulder shrug is normal. The tongue has normal motion without fasciculations.    Coordination:  No dysmetria  Motor Observation:    No asymmetry, no atrophy, and no involuntary movements noted. Tone:    Normal muscle tone.     Strength:    Strength is V/V in the upper and lower limbs.      Sensation: intact to LT   ASSESSMENT/PLAN Ms. [name] [name] is a 73 y.o. female with history of Htn, CHF, CKD, DM, Hld, anemia, TAVR for severe aortic stenosis on 01/10/2019 presenting with abnormal movements, difficulty walking and dropping things. She did not receive IV t-PA due to minimal deficits.  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 May be secondary to procedure TAVR.   Resultant  Deficits resolved  MRI head - At least 10 small areas of acute infarct in both cerebral hemispheres most compatible with cerebral emboli.  MRA head - not performed  CTA H&N - Advanced intracranial atherosclerosis with moderate to severe RIGHT and moderate LEFT ICA stenosis. She needs follow up outpatient with Vascular however given bilateral strokes unlikely bilateral emboli from ICAs.  Carotid Doppler - CTA neck performed - carotid dopplers not indicated. See above, Mod to severe atherosclerosis.  2D Echo - Per cardiology, not necessary  LDL - 89  HgbA1c - 9.5  UDS - not performed  VTE prophylaxis - SCDs  Diet  - Heart healthy with thin liquids.  aspirin 81 mg daily prior to admission, now on aspirin 81 mg daily and clopidogrel 75 mg daily  Patient counseled to be compliant with her antithrombotic medications  Ongoing aggressive stroke risk factor management  Therapy recommendations:  pending  Disposition:  Pending  Hypertension  Stable . Permissive hypertension (OK if < 220/120) but gradually normalize in 5-7 days . Long-term BP goal normotensive  Hyperlipidemia  Lipid lowering medication PTA: Zocor 20 mg daily  LDL 89, goal < 70  Current lipid lowering medication: Zocor 20 mg daily -> increase Zocor to 40 mg  daily  Continue statin at discharge  Diabetes  HgbA1c 9.5, goal < 7.0  Uncontrolled  Other Stroke Risk Factors  Advanced age  Obesity, Body mass index is 30.23 kg/m., recommend weight loss, diet and exercise as appropriate.   Other Active Problems  Suspected gabapentin toxicity - dose decreased  CKD - Creatinine 1.37  Anemia - 10.6  Mild leukocytosis - 10.9  TAVR for severe aortic stenosis on 01/10/2019  Plan  Per Cardiology TEE/TTE, not necessary; Discussed with Dr. Sallyanne Kuster no further TTE/TEE needed likely procedure related and deficits present prior to  procedure not new.  Stroke will sign off at this time.    Hospital day # 5  Personally examined patient and images, and have participated in and made any corrections needed to history, physical, neuro exam,assessment and plan as stated above.  I have personally obtained the history, evaluated lab date, reviewed imaging studies and agree with radiology interpretations.       A total of 25 minutes was spent for the care of this patient, spent on counseling patient or family on different diagnostic and therapeutic options, counseling and coordination of care, riskd ans benefits of management, compliance, or risk factor reduction and education.       To contact Stroke Continuity provider, please refer to [email or web address] After hours, contact General Neurology

## 2019-01-15 NOTE — Progress Notes (Signed)
Pt was sleeping during shift change, pt has been lethargic all day  Per report. During assessing pt around 2010, pt was very lethargic , sleepy, was opening her eyes to voice and falling back to sleep right back. Blood sugar was 172. Vitals stable, pt was too weak to follow command for NIH stroke scale. AO x4, disoriented about situation. PM gabapentin held due to pt being lethargic, pt started being more alert around 2200. Able to complete NIH stroke scale. Pt was AO x4. Call bell within reach, bed alarm on. Will continue to monitor.

## 2019-01-15 NOTE — Progress Notes (Signed)
Progress Note  Patient Name: [name] [name] Date of Encounter: 01/15/2019  Primary Cardiologist: Kate Sable, MD   Subjective   She is much sleepier this morning, but is easily awoken and answers questions correctly and coherently.  Reportedly she was eating breakfast and was alert earlier today. No meaningful arrhythmia on telemetry other than occasional PACs and PVCs. Normal vital signs. Glucose was 224 at 1117 hrs.  Denies frank dyspnea, but she has prolonged expiration and some wheezing this morning. Creatinine continues to improve.  Inpatient Medications    Scheduled Meds: .  stroke: mapping our early stages of recovery book   Does not apply Once  . amLODipine  10 mg Oral QHS  . aspirin EC  81 mg Oral Daily  . carvedilol  6.25 mg Oral BID WC  . clopidogrel  75 mg Oral Q breakfast  . ferrous sulfate  325 mg Oral QODAY  . fluticasone  1 spray Each Nare Daily  . gabapentin  200 mg Oral BID  . glipiZIDE  10 mg Oral QAC breakfast  . insulin aspart  0-24 Units Subcutaneous TID AC & HS  . latanoprost  1 drop Both Eyes QHS  . levothyroxine  88 mcg Oral QAC breakfast  . [START ON 01/16/2019] metFORMIN  500 mg Oral BID WC  . simvastatin  40 mg Oral QHS  . sodium chloride flush  3 mL Intravenous Q12H  . timolol  1 drop Both Eyes BID   Continuous Infusions: . sodium chloride    . nitroGLYCERIN    . phenylephrine (NEO-SYNEPHRINE) Adult infusion     PRN Meds: sodium chloride, acetaminophen **OR** acetaminophen, albuterol, ALPRAZolam, metoprolol tartrate, morphine injection, ondansetron (ZOFRAN) IV, oxyCODONE, sodium chloride flush, traMADol   Vital Signs    Vitals:   01/15/19 0350 01/15/19 0413 01/15/19 0540 01/15/19 0835  BP: (!) 169/60  120/61 114/61  Pulse: 80 83 73 74  Resp: (!) 22 19 20    Temp: 98.3 F (36.8 C)  98.3 F (36.8 C)   TempSrc: Oral  Oral   SpO2: 97% 97% 94%   Weight:  63.4 kg    Height:        Intake/Output Summary (Last 24 hours) at  01/15/2019 1143 Last data filed at 01/15/2019 0300 Gross per 24 hour  Intake 100 ml  Output 850 ml  Net -750 ml   Last 3 Weights 01/15/2019 01/14/2019 01/13/2019  Weight (lbs) 139 lb 11.2 oz 143 lb 1.3 oz 143 lb 15.4 oz  Weight (kg) 63.368 kg 64.9 kg 65.3 kg      Telemetry    Sinus rhythm with occasional PACs and PVCs, no sustained arrhythmia- Personally Reviewed  ECG    No new tracing- Personally Reviewed  Physical Exam  Sleepy, but easy to awake, oriented GEN: No acute distress.   Neck: No JVD Cardiac: RRR, no murmurs, rubs, or gallops.  Respiratory: Clear to auscultation bilaterally. GI: Soft, nontender, non-distended  MS: No edema; No deformity. Neuro:  Nonfocal  Psych: Normal affect   Labs    Chemistry Recent Labs  Lab 01/09/19 1520  01/13/19 0522 01/14/19 0415 01/15/19 0254  NA 134*   < > 139 139 140  K 3.2*   < > 3.0* 3.9 3.6  CL 96*   < > 99 100 102  CO2 24   < > 29 26 25   GLUCOSE 266*   < > 190* 194* 157*  BUN 22   < > 25* 19 18  CREATININE  1.68*   < > 1.55* 1.37* 1.23*  CALCIUM 9.2   < > 8.6* 8.7* 8.8*  PROT 7.3  --   --   --   --   ALBUMIN 3.9  --   --   --   --   AST 17  --   --   --   --   ALT 14  --   --   --   --   ALKPHOS 74  --   --   --   --   BILITOT 0.9  --   --   --   --   GFRNONAA 30*   < > 33* 38* 43*  GFRAA 35*   < > 38* 44* 50*  ANIONGAP 14   < > 11 13 13    < > = values in this interval not displayed.     Hematology Recent Labs  Lab 01/13/19 0522 01/14/19 0415 01/15/19 0254  WBC 10.1 10.9* 10.6*  RBC 3.46* 3.75* 3.62*  HGB 9.9* 10.6* 10.5*  HCT 30.1* 32.6* 31.7*  MCV 87.0 86.9 87.6  MCH 28.6 28.3 29.0  MCHC 32.9 32.5 33.1  RDW 15.0 14.8 15.0  PLT 168 182 194    Cardiac EnzymesNo results for input(s): TROPONINI in the last 168 hours. No results for input(s): TROPIPOC in the last 168 hours.   BNP Recent Labs  Lab 01/09/19 1520  BNP 129.6*     DDimer No results for input(s): DDIMER in the last 168 hours.   Radiology      Ct Angio Head W Or Wo Contrast  Result Date: 01/13/2019 CLINICAL DATA:  Follow up stroke. Status post aortic valve replacement January 02, 2019. History of hypertension, hyperlipidemia. EXAM: CT ANGIOGRAPHY HEAD AND NECK TECHNIQUE: Multidetector CT imaging of the head and neck was performed using the standard protocol during bolus administration of intravenous contrast. Multiplanar CT image reconstructions and MIPs were obtained to evaluate the vascular anatomy. Carotid stenosis measurements (when applicable) are obtained utilizing NASCET criteria, using the distal internal carotid diameter as the denominator. CONTRAST:  119mL ISOVUE-370 IOPAMIDOL (ISOVUE-370) INJECTION 76% COMPARISON:  MRI head January 13, 2019 FINDINGS: CTA NECK FINDINGS: AORTIC ARCH: 2 vessel arch is a normal variant. Mild calcific atherosclerosis. Mild stenosis RIGHT subclavian artery due to calcific atherosclerosis. The origins of the innominate, left Common carotid artery and subclavian artery are widely patent. RIGHT CAROTID SYSTEM: Common carotid artery is patent. Mild calcific atherosclerosis of the carotid bifurcation without hemodynamically significant stenosis by NASCET criteria. Normal appearance of the internal carotid artery. LEFT CAROTID SYSTEM: Common carotid artery is patent. Mild calcific atherosclerosis of the carotid bifurcation without hemodynamically significant stenosis by NASCET criteria. Normal appearance of the internal carotid artery. VERTEBRAL ARTERIES:Codominant vertebral arteries. Normal appearance of the vertebral arteries, widely patent. SKELETON: No acute osseous process though bone windows have not been submitted. Severe mid cervical spondylosis, grade 1 C3-4 anterolisthesis. OTHER NECK: Soft tissues of the neck are nonacute though, not tailored for evaluation. UPPER CHEST: Mosaic RIGHT lung attenuation seen with small airway disease. CTA HEAD FINDINGS: ANTERIOR CIRCULATION: Patent cervical internal carotid  arteries, petrous, cavernous and supra clinoid internal carotid arteries. Severe calcific atherosclerosis carotid siphons with moderate LEFT and possibly RIGHT paraclinoid stenosis. Patent anterior communicating artery. Patent anterior and middle cerebral arteries, moderate luminal irregularity compatible with atherosclerosis. No large vessel occlusion, contrast extravasation or aneurysm. POSTERIOR CIRCULATION: Patent vertebral arteries, vertebrobasilar junction and basilar artery, as well as main branch vessels. Mild calcific  atherosclerosis RIGHT V4 segment. Patent posterior cerebral arteries, moderate luminal irregularity compatible with atherosclerosis. No large vessel occlusion, significant stenosis, contrast extravasation or aneurysm. VENOUS SINUSES: Major dural venous sinuses are patent though not tailored for evaluation on this angiographic examination. ANATOMIC VARIANTS: None. DELAYED PHASE: No abnormal intracranial enhancement. MIP images reviewed. IMPRESSION: CTA NECK: 1. No hemodynamically significant stenosis ICA's. Patent vertebral arteries. CTA HEAD: 1. No emergent large vessel occlusion. 2. Advanced intracranial atherosclerosis with moderate to severe RIGHT and moderate LEFT ICA stenosis. Electronically Signed   By: Elon Alas M.D.   On: 01/13/2019 22:13   Ct Angio Neck W Or Wo Contrast  Result Date: 01/13/2019 CLINICAL DATA:  Follow up stroke. Status post aortic valve replacement January 02, 2019. History of hypertension, hyperlipidemia. EXAM: CT ANGIOGRAPHY HEAD AND NECK TECHNIQUE: Multidetector CT imaging of the head and neck was performed using the standard protocol during bolus administration of intravenous contrast. Multiplanar CT image reconstructions and MIPs were obtained to evaluate the vascular anatomy. Carotid stenosis measurements (when applicable) are obtained utilizing NASCET criteria, using the distal internal carotid diameter as the denominator. CONTRAST:  15mL ISOVUE-370  IOPAMIDOL (ISOVUE-370) INJECTION 76% COMPARISON:  MRI head January 13, 2019 FINDINGS: CTA NECK FINDINGS: AORTIC ARCH: 2 vessel arch is a normal variant. Mild calcific atherosclerosis. Mild stenosis RIGHT subclavian artery due to calcific atherosclerosis. The origins of the innominate, left Common carotid artery and subclavian artery are widely patent. RIGHT CAROTID SYSTEM: Common carotid artery is patent. Mild calcific atherosclerosis of the carotid bifurcation without hemodynamically significant stenosis by NASCET criteria. Normal appearance of the internal carotid artery. LEFT CAROTID SYSTEM: Common carotid artery is patent. Mild calcific atherosclerosis of the carotid bifurcation without hemodynamically significant stenosis by NASCET criteria. Normal appearance of the internal carotid artery. VERTEBRAL ARTERIES:Codominant vertebral arteries. Normal appearance of the vertebral arteries, widely patent. SKELETON: No acute osseous process though bone windows have not been submitted. Severe mid cervical spondylosis, grade 1 C3-4 anterolisthesis. OTHER NECK: Soft tissues of the neck are nonacute though, not tailored for evaluation. UPPER CHEST: Mosaic RIGHT lung attenuation seen with small airway disease. CTA HEAD FINDINGS: ANTERIOR CIRCULATION: Patent cervical internal carotid arteries, petrous, cavernous and supra clinoid internal carotid arteries. Severe calcific atherosclerosis carotid siphons with moderate LEFT and possibly RIGHT paraclinoid stenosis. Patent anterior communicating artery. Patent anterior and middle cerebral arteries, moderate luminal irregularity compatible with atherosclerosis. No large vessel occlusion, contrast extravasation or aneurysm. POSTERIOR CIRCULATION: Patent vertebral arteries, vertebrobasilar junction and basilar artery, as well as main branch vessels. Mild calcific atherosclerosis RIGHT V4 segment. Patent posterior cerebral arteries, moderate luminal irregularity compatible with  atherosclerosis. No large vessel occlusion, significant stenosis, contrast extravasation or aneurysm. VENOUS SINUSES: Major dural venous sinuses are patent though not tailored for evaluation on this angiographic examination. ANATOMIC VARIANTS: None. DELAYED PHASE: No abnormal intracranial enhancement. MIP images reviewed. IMPRESSION: CTA NECK: 1. No hemodynamically significant stenosis ICA's. Patent vertebral arteries. CTA HEAD: 1. No emergent large vessel occlusion. 2. Advanced intracranial atherosclerosis with moderate to severe RIGHT and moderate LEFT ICA stenosis. Electronically Signed   By: Elon Alas M.D.   On: 01/13/2019 22:13   Mr Brain Wo Contrast  Result Date: 01/13/2019 CLINICAL DATA:  Speech difficulty. Difficulty walking. TAVR 01/10/2019 EXAM: MRI HEAD WITHOUT CONTRAST TECHNIQUE: Multiplanar, multiecho pulse sequences of the brain and surrounding structures were obtained without intravenous contrast. COMPARISON:  None. FINDINGS: Brain: Scattered small areas of acute infarct in both cerebral hemispheres. Approximately 10 small acute infarcts are identified compatible  with emboli. Mild atrophy and mild chronic microvascular ischemia. Chronic hemorrhage in the central pons. Negative for hydrocephalus. Negative for mass lesion. Vascular: Normal arterial flow voids Skull and upper cervical spine: Negative Sinuses/Orbits: Mild mucosal edema throughout the paranasal sinuses. Bilateral cataract surgery Other: None IMPRESSION: At least 10 small areas of acute infarct in both cerebral hemispheres most compatible with cerebral emboli. Atrophy and mild chronic microvascular ischemia. Chronic microhemorrhage in the pons likely due to hypertension. Electronically Signed   By: Franchot Gallo M.D.   On: 01/13/2019 14:50    Cardiac Studies   Echo January 11, 2019 1. A 42mm Edwards Sapien bioprosthetic aortic valve valve is present in the aortic position. The prosthesis was placed on 01/10/2019. 2. AV  Mean Grad:11.0 mmHg. 3. Mild paravalvular regurgitation, no prosthetic regurgitation. of the aortic prosthesis. 4. The left ventricle appears to be normal in size, has mild wall thickness 60-65% ejection fraction Spectral Doppler shows impaired relaxation pattern of diastolic filling. 5. The right ventricle has normal size and normal systolic function. 6. Right ventricular systolic pressure is could not be assessed. 7. Mitral valve regurgitation is trivial by color flow Doppler. 8. MV Mean Dias grad:4.0 mmHg, HR 73 bpm. 9. Pericardium: There is no evidence of pericardial effusion.   Patient Profile     73 y.o. female with severe aortic stenosis, now day 6 status post TAVR (Edwards Sapien 3, 23 mm), CKD stage III, chronic diastolic heart failure, diabetes mellitus with neuropathy, hypertension, hyperlipidemia, rheumatoid arthritis, developed acute worsening of chronic gait instability after the procedure.  Assessment & Plan    1. AS s/p TAVR:  Good hemodynamic result, mild perivalvular leak.  No cardiac complaints.  No groin complications.  Plan to continue aspirin and clopidogrel.  At this point, I am not sure that doing another echocardiogram (either TTE or TEE) after the ones that she had on January 28 and January 29 would add any information. 2. 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  None of these appear to correlate with any obvious clinical deficit, but it is possible that they are worsening her pre-existing mobility issues.  Her gait/balance challenges predate the procedure. Asterixis noted, felt to be possibly due to excessive gabapentin dosage, contributing to her balance and gait difficulties.  Multiple tiny embolic lesions not unexpected after TAVR.  Anticoagulation is not indicated at this time.  We will need inpatient rehab,  probably will be transferred on Monday. 4. AKI on CKD 3:  Creatinine continues to improve, seems to be back to baseline.  Restart diuretic.  Previously at home she was taking both hydrochlorothiazide and furosemide. 5. DM: Fairly persistent hyperglycemia since the procedure.  We will restart her home glipizide and metformin.  Still hyperglycemic.  Oral antidiabetic medications restarted yesterday. 6.  Wheezing: Check chest x-ray for signs of volume overload.  Bronchodilators as needed.  Will restart her home furosemide 20 mg daily.  For questions or updates, please contact [place] Please consult [email or web address] for contact info under        Signed, Sanda Klein, MD  01/15/2019, 11:43 AM

## 2019-01-15 NOTE — Discharge Summary (Signed)
[facility] VALVE TEAM  Discharge Summary    Patient ID: [name] [name] MRN: [id number]; DOB: April 07, 1946  Admit date: 01/10/2019 Discharge date: 01/16/2019  Primary Care Provider: Redmond School, MD  Primary Cardiologist: Kate Sable, MD / Dr. Angelena Form & Dr. Roxy Manns (TAVR)  Discharge Diagnoses    Principal Problem:   S/P TAVR (transcatheter aortic valve replacement) Active Problems:   Essential hypertension   Hyperlipidemia   Chronic diastolic CHF (congestive heart failure) (HCC)   CKD (chronic kidney disease) stage 3, GFR 30-59 ml/min (HCC)   Severe aortic stenosis   Rheumatoid arthritis ([place])   Hypothyroidism   Diabetes mellitus, type 2 ([place])   Embolic stroke ([place])   AKI (acute kidney injury) ([place])   Drug toxicity   Intracranial carotid stenosis   Allergies No Known Allergies  Diagnostic Studies/Procedures   TAVR OPERATIVE NOTE   Date of Procedure:01/10/2019  Preoperative Diagnosis:Severe Aortic Stenosis   Postoperative Diagnosis:Same   Procedure:   Transcatheter Aortic Valve Replacement - PercutaneousRightTransfemoral Approach Edwards Sapien 3 THV (size 53mm, model # 9600TFX, serial # [id number])  Co-Surgeons:Christopher Angelena Form, MD andClarence H. Roxy Manns, MD  Anesthesiologist:Thomas Fransisco Beau, MD  Echocardiographer:Mihai Croitoru, MD  Pre-operative Echo Findings: ? Severe aortic stenosis ? Normalleft ventricular systolic function  Post-operative Echo Findings: ? Mildparavalvular leak ? Normalleft ventricular systolic function  _____________   Echo 01/11/2019 IMPRESSIONS: 1. A 51mm Edwards Sapien bioprosthetic aortic valve valve is present in the aortic position. The prosthesis was placed on 01/10/2019. 2. AV Mean Grad:11.0 mmHg. 3. Mild paravalvular regurgitation, no prosthetic  regurgitation. of the aortic prosthesis. 4. The left ventricle appears to be normal in size, has mild wall thickness 60-65% ejection fraction Spectral Doppler shows impaired relaxation pattern of diastolic filling. 5. The right ventricle has normal size and normal systolic function. 6. Right ventricular systolic pressure is could not be assessed. 7. Mitral valve regurgitation is trivial by color flow Doppler. 8. MV Mean Dias grad:4.0 mmHg, HR 73 bpm. 9. Pericardium: There is no evidence of pericardial effusion.  ________________  MRI Head 01/13/19  IMPRESSION: At least 10 small areas of acute infarct in both cerebral hemispheres most compatible with cerebral emboli.  Atrophy and mild chronic microvascular ischemia. Chronic microhemorrhage in the pons likely due to hypertension.  ________________   CTA neck 01/13/19  IMPRESSION: CTA NECK: 1. No hemodynamically significant stenosis ICA's. Patent vertebral arteries. CTA HEAD: 1. No emergent large vessel occlusion. 2. Advanced intracranial atherosclerosis with moderate to severe RIGHT and moderate LEFT ICA stenosis.   History of Present Illness     [name] [name] a 73 y.o.femalewith a history of HTN, HLD, uncontrolled hypothyroidism, DMT2, chronic bronchitis, CKD stage III, RA and severe aortic stenosis who presented to [facility] on 01/10/2019 for planned TAVR.  Patient states that she has known of presence of a heart murmur for several years.Patient lives a somewhat sedentary lifestyle and has limited physical mobility. She has been disabled since 2012 when she underwent her third and most recent back surgery. She remains on chronic opioid pain relievers for chronic back pain. She has a long history of poor exercise tolerance and underwent an exercise treadmill test in 2019 that was felt to be nondiagnostic. She underwent routine transthoracic echocardiogram on July 13, 2018 which revealed normal left ventricular systolic  function with ejection fraction estimated 50-55%. There was aortic stenosis that was felt to possibly represent paradoxical low gradient aortic stenosis with peak velocity across the aortic valve measured 2.85  m/s corresponding to mean transvalvular gradient estimated 20 mmHg.She was referred to the multidisciplinary heart valve clinic and evaluated by Dr. Angelena Form on August 04, 2018. She was scheduled for elective diagnostic cardiac catheterization, but she presented acutely with shortness of breath on August 09, 2018. She was hospitalized for nearly a week and treated for an acute exacerbation of chronic diastolic congestive heart failure. She was notably hypertensive during admission and she developed acute kidney injury. Antihypertensive medications were adjusted. She was also notably severely hypothyroid with TSH level measured 116. Prior to that she apparently had not been taking her prescribed dose of Synthroid. Synthroid was resumed and she underwent diagnostic cardiac catheterization during her admission on August 11, 2018. She was found to have mild to moderate nonobstructive coronary artery disease. Peak to peak and mean transvalvular gradients measured at catheterization were reported 16 and 19.5 mmHg, respectively, corresponding to aortic valve area calculated 1.09 cm. The patient was noted to have poor dentition during her admission and evaluated by Dr. Lawana Chambers.She underwent CT angiography and was discharged home. She later underwent elective dental extraction August 17, 2018. She recovered from dental extraction uneventfully and was seen in follow-up last month by Rosaria Ferries [place].She was referred for elective surgical consultation.Follow-up echocardiogram performed January 04, 2019 revealed significant progression of disease with severe aortic stenosis and normal left ventricular systolic function. Peak velocity across aortic valve had increased to 3.8 m/s  corresponding to mean transvalvular gradient estimated 32 mmHg and DVI reported 0.23 with aortic valve area estimated 0.71 cm. Hypokalemia during admission.   The patient has been evaluated by the multidisciplinary valve team and felt to have severe, symptomatic aortic stenosis and to be a suitable candidate for TAVR, which was set up for 01/10/2019.   Hospital Course     Consultants: neurology, inpatient rehab  Severe AS:s/p successful TAVR with a52mm Edwards Sapien 3 THV via the TF approach on 01/10/19. Post operative echoshowed EF 60% with normally functioning TAVR with mean gradient 11 mmHg and mild PVL. Groin sites are stable. ECG withsinus with IRBBB (which is old) butno high grade heart block. Continue Asprin andplavix.Plan for discharge to inpatient rehab today with follow up in the office later this month.  HTN: BP has been labile. She has been resumed on home amlodipine 10mg  daily and lasix 20mg  daily. Coreg 6.25mg  BID was also added during her admission.   DMT2: she was treated with SSI while admitted. Home oral antihyperglycemics have also been resumed given persistent hyperglycemia.   Hypothyroidism: continue synthroid  AKI with baseline CKD stage III: creat up to 2.08. Review of labs show labile kidney function that ranges between 1.18-2.44. Creat down to 1.23 on discharge today. This is likely her baseline  Somnolence and difficulty walking: after TAVR she was somnolent and had trouble walking due to her "knees buckling." She also had asterixis. After further investigation she reported having difficulty holding things at home and gait and balance issues prior to admission. Due to worsening of these issues neurology was consulted. She was felt to have gabapentin toxicity in the setting of AKI. Also, MRI showed at least 10 small areas of acute infarct in both cerebral hemispheres most compatible with cerebral emboli.  Gabapentin toxicity: her home dose was listed at 600mg   TID in the computer and this was reordered after her surgery. Pharmacy voiced some concerns during admission with AKI, but with labile renal function and this being listed as her chronic dosage it was decided to leave  it and address as an outpatient. Neuro recommended stopping the medication, but the patient was reluctant so it was reduced to 200mg  BID.   Embolic CVA: per Dr. Sallyanne Kuster, "multiple tiny embolic lesions are seen on brain MRI. None of these appear to correlate with any obvious clinical deficit,but it is possible that they are worsening her pre-existing mobility issues. Her gait/balance challenges predate the procedure. Asterixis noted, felt to be possibly due to excessive gabapentin dosage,contributing to her balance and gait difficulties. Multiple tiny embolic lesions not unexpected after TAVR. Anticoagulation is not indicated at this time. We will need inpatient rehab."  Advanced intracranial atherosclerosis: during stroke work up, she was found to have moderate to severe RIGHT and moderate LEFT ICA stenosis. Vascular follow up with need to be arranged. I have placed an ambulatory referral to [facility]  Dispo: she will go to inpatient rehab today and likely stay there ~1 week  _____________  Discharge Vitals Blood pressure (!) 104/38, pulse 61, temperature 97.6 F (36.4 C), temperature source Oral, resp. rate 10, height 4\' 9"  (1.448 m), weight 64.5 kg, SpO2 100 %.  Filed Weights   01/14/19 0437 01/15/19 0413 01/16/19 0622  Weight: 64.9 kg 63.4 kg 64.5 kg    Labs & Radiologic Studies    CBC Recent Labs    01/14/19 0415 01/15/19 0254  WBC 10.9* 10.6*  HGB 10.6* 10.5*  HCT 32.6* 31.7*  MCV 86.9 87.6  PLT 182 355   Basic Metabolic Panel Recent Labs    01/14/19 0415 01/15/19 0254  NA 139 140  K 3.9 3.6  CL 100 102  CO2 26 25  GLUCOSE 194* 157*  BUN 19 18  CREATININE 1.37* 1.23*  CALCIUM 8.7* 8.8*   Liver Function Tests No results for input(s): AST, ALT, ALKPHOS,  BILITOT, PROT, ALBUMIN in the last 72 hours. No results for input(s): LIPASE, AMYLASE in the last 72 hours. Cardiac Enzymes No results for input(s): CKTOTAL, CKMB, CKMBINDEX, TROPONINI in the last 72 hours. BNP Invalid input(s): POCBNP D-Dimer No results for input(s): DDIMER in the last 72 hours. Hemoglobin A1C Recent Labs    01/14/19 0415  HGBA1C 9.5*   Fasting Lipid Panel Recent Labs    01/14/19 0415  CHOL 163  HDL 47  LDLCALC 89  TRIG 133  CHOLHDL 3.5   Thyroid Function Tests No results for input(s): TSH, T4TOTAL, T3FREE, THYROIDAB in the last 72 hours.  Invalid input(s): FREET3 _____________  Ct Angio Head W Or Wo Contrast  Result Date: 01/13/2019 CLINICAL DATA:  Follow up stroke. Status post aortic valve replacement January 02, 2019. History of hypertension, hyperlipidemia. EXAM: CT ANGIOGRAPHY HEAD AND NECK TECHNIQUE: Multidetector CT imaging of the head and neck was performed using the standard protocol during bolus administration of intravenous contrast. Multiplanar CT image reconstructions and MIPs were obtained to evaluate the vascular anatomy. Carotid stenosis measurements (when applicable) are obtained utilizing NASCET criteria, using the distal internal carotid diameter as the denominator. CONTRAST:  141mL ISOVUE-370 IOPAMIDOL (ISOVUE-370) INJECTION 76% COMPARISON:  MRI head January 13, 2019 FINDINGS: CTA NECK FINDINGS: AORTIC ARCH: 2 vessel arch is a normal variant. Mild calcific atherosclerosis. Mild stenosis RIGHT subclavian artery due to calcific atherosclerosis. The origins of the innominate, left Common carotid artery and subclavian artery are widely patent. RIGHT CAROTID SYSTEM: Common carotid artery is patent. Mild calcific atherosclerosis of the carotid bifurcation without hemodynamically significant stenosis by NASCET criteria. Normal appearance of the internal carotid artery. LEFT CAROTID SYSTEM: Common carotid artery is patent.  Mild calcific atherosclerosis of  the carotid bifurcation without hemodynamically significant stenosis by NASCET criteria. Normal appearance of the internal carotid artery. VERTEBRAL ARTERIES:Codominant vertebral arteries. Normal appearance of the vertebral arteries, widely patent. SKELETON: No acute osseous process though bone windows have not been submitted. Severe mid cervical spondylosis, grade 1 C3-4 anterolisthesis. OTHER NECK: Soft tissues of the neck are nonacute though, not tailored for evaluation. UPPER CHEST: Mosaic RIGHT lung attenuation seen with small airway disease. CTA HEAD FINDINGS: ANTERIOR CIRCULATION: Patent cervical internal carotid arteries, petrous, cavernous and supra clinoid internal carotid arteries. Severe calcific atherosclerosis carotid siphons with moderate LEFT and possibly RIGHT paraclinoid stenosis. Patent anterior communicating artery. Patent anterior and middle cerebral arteries, moderate luminal irregularity compatible with atherosclerosis. No large vessel occlusion, contrast extravasation or aneurysm. POSTERIOR CIRCULATION: Patent vertebral arteries, vertebrobasilar junction and basilar artery, as well as main branch vessels. Mild calcific atherosclerosis RIGHT V4 segment. Patent posterior cerebral arteries, moderate luminal irregularity compatible with atherosclerosis. No large vessel occlusion, significant stenosis, contrast extravasation or aneurysm. VENOUS SINUSES: Major dural venous sinuses are patent though not tailored for evaluation on this angiographic examination. ANATOMIC VARIANTS: None. DELAYED PHASE: No abnormal intracranial enhancement. MIP images reviewed. IMPRESSION: CTA NECK: 1. No hemodynamically significant stenosis ICA's. Patent vertebral arteries. CTA HEAD: 1. No emergent large vessel occlusion. 2. Advanced intracranial atherosclerosis with moderate to severe RIGHT and moderate LEFT ICA stenosis. Electronically Signed   By: Elon Alas M.D.   On: 01/13/2019 22:13   Dg Chest 2  View  Result Date: 01/15/2019 CLINICAL DATA:  Wheezing today. EXAM: CHEST - 2 VIEW COMPARISON:  PA and lateral chest 01/09/2019 and 09/19/2018. CT chest 04/17/2018. FINDINGS: There is cardiomegaly without edema. The patient is status post aortic valve replacement. Lungs are clear. No pneumothorax or pleural fluid. Aortic atherosclerosis noted. No acute or focal bony abnormality. IMPRESSION: No acute disease. Cardiomegaly. Atherosclerosis. Electronically Signed   By: Inge Rise M.D.   On: 01/15/2019 16:42   Dg Chest 2 View  Result Date: 01/10/2019 CLINICAL DATA:  Preop for surgery. EXAM: CHEST - 2 VIEW COMPARISON:  10/15/2018 FINDINGS: The heart is enlarged but stable. Stable mild tortuosity and calcification of the thoracic aorta. Slightly prominent pulmonary hila are unchanged. Linear scarring changes in the left mid lung. No infiltrates, edema or effusions. The bony thorax is intact. IMPRESSION: Stable cardiac enlargement. No acute pulmonary findings. Electronically Signed   By: Marijo Sanes M.D.   On: 01/10/2019 08:04   Ct Angio Neck W Or Wo Contrast  Result Date: 01/13/2019 CLINICAL DATA:  Follow up stroke. Status post aortic valve replacement January 02, 2019. History of hypertension, hyperlipidemia. EXAM: CT ANGIOGRAPHY HEAD AND NECK TECHNIQUE: Multidetector CT imaging of the head and neck was performed using the standard protocol during bolus administration of intravenous contrast. Multiplanar CT image reconstructions and MIPs were obtained to evaluate the vascular anatomy. Carotid stenosis measurements (when applicable) are obtained utilizing NASCET criteria, using the distal internal carotid diameter as the denominator. CONTRAST:  17mL ISOVUE-370 IOPAMIDOL (ISOVUE-370) INJECTION 76% COMPARISON:  MRI head January 13, 2019 FINDINGS: CTA NECK FINDINGS: AORTIC ARCH: 2 vessel arch is a normal variant. Mild calcific atherosclerosis. Mild stenosis RIGHT subclavian artery due to calcific  atherosclerosis. The origins of the innominate, left Common carotid artery and subclavian artery are widely patent. RIGHT CAROTID SYSTEM: Common carotid artery is patent. Mild calcific atherosclerosis of the carotid bifurcation without hemodynamically significant stenosis by NASCET criteria. Normal appearance of the internal carotid artery. LEFT CAROTID  SYSTEM: Common carotid artery is patent. Mild calcific atherosclerosis of the carotid bifurcation without hemodynamically significant stenosis by NASCET criteria. Normal appearance of the internal carotid artery. VERTEBRAL ARTERIES:Codominant vertebral arteries. Normal appearance of the vertebral arteries, widely patent. SKELETON: No acute osseous process though bone windows have not been submitted. Severe mid cervical spondylosis, grade 1 C3-4 anterolisthesis. OTHER NECK: Soft tissues of the neck are nonacute though, not tailored for evaluation. UPPER CHEST: Mosaic RIGHT lung attenuation seen with small airway disease. CTA HEAD FINDINGS: ANTERIOR CIRCULATION: Patent cervical internal carotid arteries, petrous, cavernous and supra clinoid internal carotid arteries. Severe calcific atherosclerosis carotid siphons with moderate LEFT and possibly RIGHT paraclinoid stenosis. Patent anterior communicating artery. Patent anterior and middle cerebral arteries, moderate luminal irregularity compatible with atherosclerosis. No large vessel occlusion, contrast extravasation or aneurysm. POSTERIOR CIRCULATION: Patent vertebral arteries, vertebrobasilar junction and basilar artery, as well as main branch vessels. Mild calcific atherosclerosis RIGHT V4 segment. Patent posterior cerebral arteries, moderate luminal irregularity compatible with atherosclerosis. No large vessel occlusion, significant stenosis, contrast extravasation or aneurysm. VENOUS SINUSES: Major dural venous sinuses are patent though not tailored for evaluation on this angiographic examination. ANATOMIC  VARIANTS: None. DELAYED PHASE: No abnormal intracranial enhancement. MIP images reviewed. IMPRESSION: CTA NECK: 1. No hemodynamically significant stenosis ICA's. Patent vertebral arteries. CTA HEAD: 1. No emergent large vessel occlusion. 2. Advanced intracranial atherosclerosis with moderate to severe RIGHT and moderate LEFT ICA stenosis. Electronically Signed   By: Elon Alas M.D.   On: 01/13/2019 22:13   Mr Brain Wo Contrast  Result Date: 01/13/2019 CLINICAL DATA:  Speech difficulty. Difficulty walking. TAVR 01/10/2019 EXAM: MRI HEAD WITHOUT CONTRAST TECHNIQUE: Multiplanar, multiecho pulse sequences of the brain and surrounding structures were obtained without intravenous contrast. COMPARISON:  None. FINDINGS: Brain: Scattered small areas of acute infarct in both cerebral hemispheres. Approximately 10 small acute infarcts are identified compatible with emboli. Mild atrophy and mild chronic microvascular ischemia. Chronic hemorrhage in the central pons. Negative for hydrocephalus. Negative for mass lesion. Vascular: Normal arterial flow voids Skull and upper cervical spine: Negative Sinuses/Orbits: Mild mucosal edema throughout the paranasal sinuses. Bilateral cataract surgery Other: None IMPRESSION: At least 10 small areas of acute infarct in both cerebral hemispheres most compatible with cerebral emboli. Atrophy and mild chronic microvascular ischemia. Chronic microhemorrhage in the pons likely due to hypertension. Electronically Signed   By: Franchot Gallo M.D.   On: 01/13/2019 14:50   Dg Chest Port 1 View  Result Date: 01/10/2019 CLINICAL DATA:  Status post aortic valve replacement. Assess life-support lines. EXAM: PORTABLE CHEST 1 VIEW COMPARISON:  Chest radiograph January 09, 2019 FINDINGS: Interval aortic valve replacement. Stable cardiomegaly. Calcified aortic arch. Fullness of the RIGHT pulmonary hila most compatible with vascular shadows. Stable LEFT lower lung zone scarring. No pleural  effusion or focal consolidation. No pneumothorax. RIGHT internal jugular central venous catheter distal tip projects in distal superior vena cava. Lumbar spine hardware. IMPRESSION: 1. RIGHT internal jugular central venous catheter distal tip projects in distal superior vena cava. No pneumothorax. 2. Interval aortic valve replacement. 3. Stable cardiomegaly.  No acute pulmonary process. Electronically Signed   By: Elon Alas M.D.   On: 01/10/2019 16:56   Disposition   Pt is being discharged home today in good condition.  Follow-up Plans & Appointments    Follow-up Information    Eileen Stanford, PA-C. Go on 02/08/2019.   Specialties:  Cardiology, Radiology Why:  @ 11am for an ultrasound of your heart and follow up  with Nell Range  Contact information: [place] [id number] [phone]            Discharge Medications   Allergies as of 01/16/2019   No Known Allergies     Medication List    STOP taking these medications   hydrochlorothiazide 25 MG tablet Commonly known as:  HYDRODIURIL     TAKE these medications   ALPRAZolam 0.5 MG tablet Commonly known as:  XANAX Take 0.5 mg by mouth at bedtime as needed for sleep.   amLODipine 10 MG tablet Commonly known as:  NORVASC Take 1 tablet (10 mg total) by mouth at bedtime.   aspirin EC 81 MG tablet Take 81 mg by mouth daily.   beta carotene w/minerals tablet Take 1 tablet by mouth daily.   carvedilol 6.25 MG tablet Commonly known as:  COREG Take 1 tablet (6.25 mg total) by mouth 2 (two) times daily with a meal.   cimetidine 200 MG tablet Commonly known as:  TAGAMET Take 200 mg by mouth daily as needed (acid reflux/indigestion.).   Cinnamon 500 MG capsule Take 500 mg by mouth daily.   clopidogrel 75 MG tablet Commonly known as:  PLAVIX Take 1 tablet (75 mg total) by mouth daily with breakfast.   [employer] VIT D 5000 HIGH-POTENCY 125 MCG (5000 UT) capsule Generic drug:   Cholecalciferol Take 5,000 Units by mouth daily.   ferrous sulfate 325 (65 FE) MG tablet Take 325 mg by mouth every other day.   Fish Oil 1000 MG Caps Take 1,000 mg by mouth daily.   fluticasone 50 MCG/ACT nasal spray Commonly known as:  FLONASE Place into both nostrils daily as needed for allergies or rhinitis.   furosemide 20 MG tablet Commonly known as:  LASIX Take 20 mg by mouth daily.   gabapentin 100 MG capsule Commonly known as:  NEURONTIN Take 2 capsules (200 mg total) by mouth 2 (two) times daily. What changed:    medication strength  how much to take  when to take this   glipiZIDE 10 MG tablet Commonly known as:  GLUCOTROL Take 10 mg by mouth daily.   HYDROcodone-acetaminophen 10-325 MG tablet Commonly known as:  NORCO Take 1 tablet by mouth every 8 (eight) hours as needed for moderate pain.   latanoprost 0.005 % ophthalmic solution Commonly known as:  XALATAN Place 1 drop into both eyes at bedtime.   levothyroxine 88 MCG tablet Commonly known as:  SYNTHROID, LEVOTHROID Take 88 mcg by mouth daily.   meloxicam 15 MG tablet Commonly known as:  MOBIC Take 15 mg by mouth daily.   metFORMIN 1000 MG tablet Commonly known as:  GLUCOPHAGE Take 0.5 tablets (500 mg total) by mouth 2 (two) times daily with a meal.   potassium chloride SA 20 MEQ tablet Commonly known as:  K-DUR,KLOR-CON Take 0.5 tablets (10 mEq total) by mouth daily. What changed:  how much to take   prenatal multivitamin Tabs tablet Take 1 tablet by mouth daily.   simvastatin 20 MG tablet Commonly known as:  ZOCOR Take 20 mg by mouth at bedtime.   timolol 0.5 % ophthalmic solution Commonly known as:  TIMOPTIC Place 1 drop into both eyes 2 (two) times daily.   trolamine salicylate 10 % cream Commonly known as:  ASPERCREME Apply 1 application topically as needed for muscle pain.   Turmeric 500 MG Tabs Take 500 mg by mouth daily.   TYLENOL 8 HOUR ARTHRITIS PAIN 650 MG CR  tablet Generic drug:  acetaminophen Take 1,300 mg by mouth every 8 (eight) hours as needed for pain.   VENTOLIN HFA 108 (90 Base) MCG/ACT inhaler Generic drug:  albuterol Inhale 2 puffs into the lungs every 4 (four) hours as needed for wheezing or shortness of breath.   vitamin B-12 1000 MCG tablet Commonly known as:  CYANOCOBALAMIN Take 1,000 mcg by mouth daily.   vitamin C 500 MG tablet Commonly known as:  ASCORBIC ACID Take 500 mg by mouth daily.   vitamin E 400 UNIT capsule Take 400 Units by mouth daily.         Outstanding Labs/Studies   None   Duration of Discharge Encounter   Greater than 30 minutes including physician time.  Mable Fill, PA-C 01/16/2019, 4:04 PM [phone]

## 2019-01-16 ENCOUNTER — Inpatient Hospital Stay (HOSPITAL_COMMUNITY)
Admission: RE | Admit: 2019-01-16 | Discharge: 2019-01-25 | DRG: 091 | Disposition: A | Discharge status: Home Health | Payer: Medicare Other | Source: Intra-hospital | Attending: Physical Medicine & Rehabilitation | Admitting: Physical Medicine & Rehabilitation

## 2019-01-16 ENCOUNTER — Encounter (HOSPITAL_COMMUNITY): Payer: Self-pay

## 2019-01-16 ENCOUNTER — Other Ambulatory Visit: Payer: Self-pay

## 2019-01-16 ENCOUNTER — Other Ambulatory Visit: Payer: Self-pay | Admitting: Physician Assistant

## 2019-01-16 DIAGNOSIS — H409 Unspecified glaucoma: Secondary | ICD-10-CM | POA: Diagnosis not present

## 2019-01-16 DIAGNOSIS — G8929 Other chronic pain: Secondary | ICD-10-CM | POA: Diagnosis not present

## 2019-01-16 DIAGNOSIS — H3530 Unspecified macular degeneration: Secondary | ICD-10-CM | POA: Diagnosis not present

## 2019-01-16 DIAGNOSIS — I5032 Chronic diastolic (congestive) heart failure: Secondary | ICD-10-CM | POA: Diagnosis not present

## 2019-01-16 DIAGNOSIS — Z85820 Personal history of malignant melanoma of skin: Secondary | ICD-10-CM

## 2019-01-16 DIAGNOSIS — F329 Major depressive disorder, single episode, unspecified: Secondary | ICD-10-CM | POA: Diagnosis present

## 2019-01-16 DIAGNOSIS — I10 Essential (primary) hypertension: Secondary | ICD-10-CM | POA: Diagnosis not present

## 2019-01-16 DIAGNOSIS — N183 Chronic kidney disease, stage 3 (moderate): Secondary | ICD-10-CM | POA: Diagnosis present

## 2019-01-16 DIAGNOSIS — Z90710 Acquired absence of both cervix and uterus: Secondary | ICD-10-CM

## 2019-01-16 DIAGNOSIS — Z79890 Hormone replacement therapy (postmenopausal): Secondary | ICD-10-CM

## 2019-01-16 DIAGNOSIS — I6349 Cerebral infarction due to embolism of other cerebral artery: Secondary | ICD-10-CM | POA: Diagnosis not present

## 2019-01-16 DIAGNOSIS — Z9049 Acquired absence of other specified parts of digestive tract: Secondary | ICD-10-CM | POA: Diagnosis not present

## 2019-01-16 DIAGNOSIS — G9340 Encephalopathy, unspecified: Secondary | ICD-10-CM

## 2019-01-16 DIAGNOSIS — G9341 Metabolic encephalopathy: Secondary | ICD-10-CM | POA: Diagnosis not present

## 2019-01-16 DIAGNOSIS — E669 Obesity, unspecified: Secondary | ICD-10-CM | POA: Diagnosis not present

## 2019-01-16 DIAGNOSIS — R32 Unspecified urinary incontinence: Secondary | ICD-10-CM | POA: Diagnosis present

## 2019-01-16 DIAGNOSIS — F419 Anxiety disorder, unspecified: Secondary | ICD-10-CM | POA: Diagnosis present

## 2019-01-16 DIAGNOSIS — R0989 Other specified symptoms and signs involving the circulatory and respiratory systems: Secondary | ICD-10-CM

## 2019-01-16 DIAGNOSIS — N289 Disorder of kidney and ureter, unspecified: Secondary | ICD-10-CM | POA: Diagnosis not present

## 2019-01-16 DIAGNOSIS — Z7984 Long term (current) use of oral hypoglycemic drugs: Secondary | ICD-10-CM

## 2019-01-16 DIAGNOSIS — Z8249 Family history of ischemic heart disease and other diseases of the circulatory system: Secondary | ICD-10-CM | POA: Diagnosis not present

## 2019-01-16 DIAGNOSIS — Z953 Presence of xenogenic heart valve: Secondary | ICD-10-CM

## 2019-01-16 DIAGNOSIS — M069 Rheumatoid arthritis, unspecified: Secondary | ICD-10-CM | POA: Diagnosis present

## 2019-01-16 DIAGNOSIS — R2689 Other abnormalities of gait and mobility: Principal | ICD-10-CM | POA: Diagnosis present

## 2019-01-16 DIAGNOSIS — D638 Anemia in other chronic diseases classified elsewhere: Secondary | ICD-10-CM | POA: Diagnosis present

## 2019-01-16 DIAGNOSIS — D72823 Leukemoid reaction: Secondary | ICD-10-CM | POA: Diagnosis not present

## 2019-01-16 DIAGNOSIS — Z833 Family history of diabetes mellitus: Secondary | ICD-10-CM | POA: Diagnosis not present

## 2019-01-16 DIAGNOSIS — Z981 Arthrodesis status: Secondary | ICD-10-CM

## 2019-01-16 DIAGNOSIS — I130 Hypertensive heart and chronic kidney disease with heart failure and stage 1 through stage 4 chronic kidney disease, or unspecified chronic kidney disease: Secondary | ICD-10-CM | POA: Diagnosis not present

## 2019-01-16 DIAGNOSIS — E785 Hyperlipidemia, unspecified: Secondary | ICD-10-CM | POA: Diagnosis not present

## 2019-01-16 DIAGNOSIS — E876 Hypokalemia: Secondary | ICD-10-CM | POA: Diagnosis not present

## 2019-01-16 DIAGNOSIS — E1169 Type 2 diabetes mellitus with other specified complication: Secondary | ICD-10-CM

## 2019-01-16 DIAGNOSIS — G92 Toxic encephalopathy: Secondary | ICD-10-CM | POA: Diagnosis not present

## 2019-01-16 DIAGNOSIS — I6310 Cerebral infarction due to embolism of unspecified precerebral artery: Secondary | ICD-10-CM

## 2019-01-16 DIAGNOSIS — E1122 Type 2 diabetes mellitus with diabetic chronic kidney disease: Secondary | ICD-10-CM | POA: Diagnosis present

## 2019-01-16 DIAGNOSIS — Z7982 Long term (current) use of aspirin: Secondary | ICD-10-CM

## 2019-01-16 DIAGNOSIS — D72829 Elevated white blood cell count, unspecified: Secondary | ICD-10-CM

## 2019-01-16 DIAGNOSIS — I6529 Occlusion and stenosis of unspecified carotid artery: Secondary | ICD-10-CM

## 2019-01-16 DIAGNOSIS — Z7951 Long term (current) use of inhaled steroids: Secondary | ICD-10-CM

## 2019-01-16 DIAGNOSIS — Z83511 Family history of glaucoma: Secondary | ICD-10-CM

## 2019-01-16 DIAGNOSIS — N179 Acute kidney failure, unspecified: Secondary | ICD-10-CM

## 2019-01-16 DIAGNOSIS — J449 Chronic obstructive pulmonary disease, unspecified: Secondary | ICD-10-CM | POA: Diagnosis not present

## 2019-01-16 DIAGNOSIS — N189 Chronic kidney disease, unspecified: Secondary | ICD-10-CM

## 2019-01-16 DIAGNOSIS — D62 Acute posthemorrhagic anemia: Secondary | ICD-10-CM

## 2019-01-16 DIAGNOSIS — Z79899 Other long term (current) drug therapy: Secondary | ICD-10-CM

## 2019-01-16 DIAGNOSIS — I639 Cerebral infarction, unspecified: Secondary | ICD-10-CM | POA: Diagnosis present

## 2019-01-16 LAB — GLUCOSE, CAPILLARY
GLUCOSE-CAPILLARY: 106 mg/dL — AB (ref 70–99)
Glucose-Capillary: 267 mg/dL — ABNORMAL HIGH (ref 70–99)
Glucose-Capillary: 250 mg/dL — ABNORMAL HIGH (ref 70–99)

## 2019-01-16 MED ORDER — OXYCODONE HCL 5 MG PO TABS
5.0000 mg | ORAL_TABLET | ORAL | Status: DC | PRN
  Administered 2019-01-18: 5 mg via ORAL
  Filled 2019-01-16: qty 1

## 2019-01-16 MED ORDER — FERROUS SULFATE 325 (65 FE) MG PO TABS
325.0000 mg | ORAL_TABLET | ORAL | Status: DC
  Administered 2019-01-18 – 2019-01-24 (×6): 325 mg via ORAL
  Filled 2019-01-16 (×6): qty 1

## 2019-01-16 MED ORDER — GABAPENTIN 100 MG PO CAPS: 200.0000 mg | ORAL_CAPSULE | Freq: Two times a day (BID) | ORAL | 0 refills | Status: DC

## 2019-01-16 MED ORDER — DIPHENHYDRAMINE HCL 12.5 MG/5ML PO ELIX: 12.5000 mg | ORAL_SOLUTION | Freq: Four times a day (QID) | ORAL | Status: DC | PRN

## 2019-01-16 MED ORDER — GABAPENTIN 100 MG PO CAPS
200.0000 mg | ORAL_CAPSULE | Freq: Two times a day (BID) | ORAL | Status: DC
  Administered 2019-01-16 – 2019-01-17 (×2): 200 mg via ORAL
  Filled 2019-01-16 (×2): qty 2

## 2019-01-16 MED ORDER — POTASSIUM CHLORIDE CRYS ER 20 MEQ PO TBCR: 10.0000 meq | EXTENDED_RELEASE_TABLET | Freq: Every day | ORAL | 6 refills | Status: DC

## 2019-01-16 MED ORDER — GLIPIZIDE 5 MG PO TABS
10.0000 mg | ORAL_TABLET | Freq: Every day | ORAL | Status: DC
  Administered 2019-01-17 – 2019-01-23 (×7): 10 mg via ORAL
  Filled 2019-01-16 (×9): qty 2

## 2019-01-16 MED ORDER — CARVEDILOL 6.25 MG PO TABS
6.2500 mg | ORAL_TABLET | Freq: Two times a day (BID) | ORAL | Status: DC
  Administered 2019-01-17 – 2019-01-25 (×18): 6.25 mg via ORAL
  Filled 2019-01-16 (×18): qty 1

## 2019-01-16 MED ORDER — GUAIFENESIN-DM 100-10 MG/5ML PO SYRP: 5.0000 mL | ORAL_SOLUTION | Freq: Four times a day (QID) | ORAL | Status: DC | PRN

## 2019-01-16 MED ORDER — CARVEDILOL 6.25 MG PO TABS: 6.2500 mg | ORAL_TABLET | Freq: Two times a day (BID) | ORAL | 6 refills | Status: DC

## 2019-01-16 MED ORDER — PROCHLORPERAZINE MALEATE 5 MG PO TABS: 5.0000 mg | ORAL_TABLET | Freq: Four times a day (QID) | ORAL | Status: DC | PRN

## 2019-01-16 MED ORDER — TIMOLOL MALEATE 0.5 % OP SOLN
1.0000 [drp] | Freq: Two times a day (BID) | OPHTHALMIC | Status: DC
  Administered 2019-01-16 – 2019-01-25 (×18): 1 [drp] via OPHTHALMIC
  Filled 2019-01-16: qty 5

## 2019-01-16 MED ORDER — FLUTICASONE PROPIONATE 50 MCG/ACT NA SUSP
1.0000 | Freq: Every day | NASAL | Status: DC
  Administered 2019-01-17 – 2019-01-24 (×9): 1 via NASAL
  Filled 2019-01-16: qty 16

## 2019-01-16 MED ORDER — LATANOPROST 0.005 % OP SOLN
1.0000 [drp] | Freq: Every day | OPHTHALMIC | Status: DC
  Administered 2019-01-16 – 2019-01-24 (×9): 1 [drp] via OPHTHALMIC
  Filled 2019-01-16: qty 2.5

## 2019-01-16 MED ORDER — FUROSEMIDE 20 MG PO TABS
20.0000 mg | ORAL_TABLET | Freq: Every day | ORAL | Status: DC
  Administered 2019-01-17: 20 mg via ORAL
  Filled 2019-01-16: qty 1

## 2019-01-16 MED ORDER — TRAMADOL HCL 50 MG PO TABS: 50.0000 mg | ORAL_TABLET | ORAL | Status: DC | PRN

## 2019-01-16 MED ORDER — BISACODYL 10 MG RE SUPP: 10.0000 mg | Freq: Every day | RECTAL | Status: DC | PRN

## 2019-01-16 MED ORDER — METFORMIN HCL 500 MG PO TABS
500.0000 mg | ORAL_TABLET | Freq: Two times a day (BID) | ORAL | Status: DC
  Administered 2019-01-17 (×2): 500 mg via ORAL
  Filled 2019-01-16 (×3): qty 1

## 2019-01-16 MED ORDER — AMLODIPINE BESYLATE 10 MG PO TABS
10.0000 mg | ORAL_TABLET | Freq: Every day | ORAL | Status: DC
  Administered 2019-01-17 – 2019-01-25 (×9): 10 mg via ORAL
  Filled 2019-01-16 (×9): qty 1

## 2019-01-16 MED ORDER — PROCHLORPERAZINE 25 MG RE SUPP: 12.5000 mg | Freq: Four times a day (QID) | RECTAL | Status: DC | PRN

## 2019-01-16 MED ORDER — ACETAMINOPHEN 325 MG PO TABS
325.0000 mg | ORAL_TABLET | ORAL | Status: DC | PRN
  Administered 2019-01-21 – 2019-01-24 (×2): 650 mg via ORAL
  Filled 2019-01-16 (×3): qty 2

## 2019-01-16 MED ORDER — PROCHLORPERAZINE EDISYLATE 10 MG/2ML IJ SOLN: 5.0000 mg | Freq: Four times a day (QID) | INTRAMUSCULAR | Status: DC | PRN

## 2019-01-16 MED ORDER — INSULIN ASPART 100 UNIT/ML ~~LOC~~ SOLN
0.0000 [IU] | Freq: Three times a day (TID) | SUBCUTANEOUS | Status: DC
  Administered 2019-01-16: 8 [IU] via SUBCUTANEOUS
  Administered 2019-01-17: 4 [IU] via SUBCUTANEOUS
  Administered 2019-01-17: 1 [IU] via SUBCUTANEOUS
  Administered 2019-01-18 (×2): 8 [IU] via SUBCUTANEOUS
  Administered 2019-01-18 – 2019-01-19 (×2): 2 [IU] via SUBCUTANEOUS
  Administered 2019-01-19: 8 [IU] via SUBCUTANEOUS
  Administered 2019-01-19: 2 [IU] via SUBCUTANEOUS
  Administered 2019-01-20 (×3): 4 [IU] via SUBCUTANEOUS
  Administered 2019-01-21 (×2): 8 [IU] via SUBCUTANEOUS
  Administered 2019-01-21 – 2019-01-22 (×2): 4 [IU] via SUBCUTANEOUS
  Administered 2019-01-22: 16 [IU] via SUBCUTANEOUS
  Administered 2019-01-22: 2 [IU] via SUBCUTANEOUS
  Administered 2019-01-22: 8 [IU] via SUBCUTANEOUS
  Administered 2019-01-23: 2 [IU] via SUBCUTANEOUS
  Administered 2019-01-23: 12 [IU] via SUBCUTANEOUS
  Administered 2019-01-23: 2 [IU] via SUBCUTANEOUS
  Administered 2019-01-23: 4 [IU] via SUBCUTANEOUS
  Administered 2019-01-24: 8 [IU] via SUBCUTANEOUS
  Administered 2019-01-24: 16 [IU] via SUBCUTANEOUS
  Administered 2019-01-25: 2 [IU] via SUBCUTANEOUS
  Administered 2019-01-25: 8 [IU] via SUBCUTANEOUS

## 2019-01-16 MED ORDER — ALPRAZOLAM 0.5 MG PO TABS
0.5000 mg | ORAL_TABLET | Freq: Every evening | ORAL | Status: DC | PRN
  Administered 2019-01-19 – 2019-01-23 (×3): 0.5 mg via ORAL
  Filled 2019-01-16 (×4): qty 1

## 2019-01-16 MED ORDER — LEVALBUTEROL HCL 0.63 MG/3ML IN NEBU
0.6300 mg | INHALATION_SOLUTION | RESPIRATORY_TRACT | Status: DC | PRN
  Administered 2019-01-18 – 2019-01-25 (×7): 0.63 mg via RESPIRATORY_TRACT
  Filled 2019-01-16 (×7): qty 3

## 2019-01-16 MED ORDER — TRAMADOL HCL 50 MG PO TABS: 50.0000 mg | ORAL_TABLET | Freq: Four times a day (QID) | ORAL | Status: DC | PRN

## 2019-01-16 MED ORDER — TRAZODONE HCL 50 MG PO TABS
25.0000 mg | ORAL_TABLET | Freq: Every evening | ORAL | Status: DC | PRN
  Administered 2019-01-18: 50 mg via ORAL
  Filled 2019-01-16: qty 1

## 2019-01-16 MED ORDER — CLOPIDOGREL BISULFATE 75 MG PO TABS
75.0000 mg | ORAL_TABLET | Freq: Every day | ORAL | Status: DC
  Administered 2019-01-17 – 2019-01-25 (×9): 75 mg via ORAL
  Filled 2019-01-16 (×9): qty 1

## 2019-01-16 MED ORDER — LEVOTHYROXINE SODIUM 88 MCG PO TABS
88.0000 ug | ORAL_TABLET | Freq: Every day | ORAL | Status: DC
  Administered 2019-01-17 – 2019-01-25 (×9): 88 ug via ORAL
  Filled 2019-01-16 (×9): qty 1

## 2019-01-16 MED ORDER — FLEET ENEMA 7-19 GM/118ML RE ENEM: 1.0000 | ENEMA | Freq: Once | RECTAL | Status: DC | PRN

## 2019-01-16 MED ORDER — ASPIRIN EC 81 MG PO TBEC
81.0000 mg | DELAYED_RELEASE_TABLET | Freq: Every day | ORAL | Status: DC
  Administered 2019-01-17 – 2019-01-25 (×9): 81 mg via ORAL
  Filled 2019-01-16 (×9): qty 1

## 2019-01-16 MED ORDER — ALUM & MAG HYDROXIDE-SIMETH 200-200-20 MG/5ML PO SUSP: 30.0000 mL | ORAL | Status: DC | PRN

## 2019-01-16 MED ORDER — ATORVASTATIN CALCIUM 10 MG PO TABS
20.0000 mg | ORAL_TABLET | Freq: Every day | ORAL | Status: DC
  Administered 2019-01-17 – 2019-01-25 (×9): 20 mg via ORAL
  Filled 2019-01-16 (×10): qty 2

## 2019-01-16 MED ORDER — POLYETHYLENE GLYCOL 3350 17 G PO PACK
17.0000 g | PACK | Freq: Every day | ORAL | Status: DC | PRN
  Administered 2019-01-17 – 2019-01-21 (×2): 17 g via ORAL
  Filled 2019-01-16 (×2): qty 1

## 2019-01-16 NOTE — Care Management Note (Signed)
Case Management Note Marvetta Gibbons RN, BSN Transitions of Care Unit 4E- RN Case Manager [phone]  Patient Details  Name: [name] [name] MRN: [id number] Date of Birth: 1946-05-12  Subjective/Objective:  Pt admitted s/p TAVR with post procedure acute infarcts                  Action/Plan: PTA pt lived at home, per CSW daughter not interested in STSNF, however would be interested in [place], consult placed for CIR and they are following for admission once cleared medically   Expected Discharge Date:  01/16/19               Expected Discharge Plan:  [place]  In-House Referral:  Clinical Social Work  Discharge planning Services  CM Consult  Post Acute Care Choice:  IP Rehab Choice offered to:  Adult Children  DME Arranged:    DME Agency:     HH Arranged:    HH Agency:     Status of Service:  Completed, signed off  If discussed at [place] of Stay Meetings, dates discussed:    Discharge Disposition: IP rehab   Additional Comments:  01/16/19- 1600- Daylee Delahoz RN, CM- per Claiborne Billings with CIR- pt has received insurance approval for IP rehab admission and bed is available today- will plan to transition pt to IP rehab later today.   Dahlia Client [place], RN 01/16/2019, 4:08 PM

## 2019-01-16 NOTE — Progress Notes (Signed)
PMR Admission Coordinator Pre-Admission Assessment  Patient: [name] [name] is an 73 y.o., female MRN: [id number] DOB: [birth year] Height: '4\' 9"'$  (144.8 cm) Weight: 64.5 kg                                                                                                                                                  Insurance Information HMO:     PPO:      PCP:      IPA:      80/20:      OTHER:  PRIMARY: UHC Medicare      Policy#: [id number]      Subscriber: Patient CM Name: [name] [name]      Phone#: [phone]     Fax#: [phone] Pre-Cert#: [id number]      Employer:  Josem Kaufmann provided by [name] [name] at [facility] Medicare for admit to CIR on 01/16/19. Updated clinicals are due to [facility] by day 7 (01/22/19) at (p): [phone]; (f): [phone] Benefits:  Phone #: [phone]     Name: NA Eff. Date: 12/14/18     Deduct: $0      Out of Pocket Max: $4,500 (met $5)      Life Max: NA CIR: $325/day for dayas 1-5, $0/day for days 6+      SNF: $0day for days 1-20, $160/day for dayas 21-49, $0/day for days 50-100; per medical necessity, 100 day limit Outpatient:      Co-Pay: $35/visit Home Health: 100%      Co-Pay: $0 DME: 80%     Co-Pay: 20% Providers:  SECONDARY:       Policy#:       Subscriber:  CM Name:       Phone#:      Fax#:  Pre-Cert#:       Employer:  Benefits:  Phone #:      Name:  Eff. Date:      Deduct:       Out of Pocket Max:      Life Max:  CIR:       SNF:  Outpatient:      Co-Pay:  Home Health:       Co-Pay:  DME:      Co-Pay:   Medicaid Application Date:       Case Manager:  Disability Application Date:       Case Worker:   Emergency Contact Information         Contact Information    Name Relation Home Work Mobile   [name],[name] Daughter [phone]     [name], [name] Daughter [phone]       Current Medical History  Patient Admitting Diagnosis: Deconditioning after TAVR History of Present Illness: Pt is a 73 yo F with history of chronic diastolic  CHF, COPD, X3AT, chronic back pain, gait disorder with falls, RA, moderate to severe AS. Pt was admitted  on 01/10/19 for TVAR. Post op, pt noted to have decreased functional mobility and decreased awareness of deficits. PT noting myoclonic jerking of BLEs that caused knee buckling, which is an acute onset according to family. CIR recommended after therapy evaluations. Due to symptomology, neurology was consulted. 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 MRI of the brain was ordered on 01/13/19. 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 Anticoagulation is not indicated. Therapies have evaluated pt and are recommending CIR. Pt is to be admitted to CIR on 01/16/19.  Complete NIHSS TOTAL: 0  Past Medical History      Past Medical History:  Diagnosis Date  . Anemia   . Anxiety   . Arthritis   . Blood transfusion without reported diagnosis   . Bronchitis   . CHF (congestive heart failure) ([place])   . CKD (chronic kidney disease) stage 3, GFR 30-59 ml/min (HCC) 08/18/2018  . Depression   . Diabetes mellitus   . Diverticulitis   . Glaucoma   . Hyperlipidemia   . Hypertension   . Hypothyroidism   . Macular degeneration   . Melanoma ([place])    left thigh, 2010  . Osteoarthritis   . Peptic ulcer   . Rheumatoid arthritis(714.0)   . 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 Severe aortic stenosis     Family History  family history includes Bipolar disorder in her daughter; Breast cancer in her sister; Colon cancer in her brother; Diabetes in her brother, mother, sister, and sister; Glaucoma in her mother; Heart attack in her brother; Heart failure in her father and mother; Hypertension in her daughter, mother, sister, sister, sister, and another family member; Other in her sister and another  family member.  Prior Rehab/Hospitalizations:  Has the patient had major surgery during 100 days prior to admission? No  Current Medications   Current Facility-Administered Medications:  .   stroke: mapping our early stages of recovery book, , Does not apply, Once, Amie Portland, MD .  0.9 %  sodium chloride infusion, 250 mL, Intravenous, PRN, Eileen Stanford, PA-C .  acetaminophen (TYLENOL) tablet 650 mg, 650 mg, Oral, Q6H PRN, 650 mg at 01/11/19 2124 **OR** acetaminophen (TYLENOL) suppository 650 mg, 650 mg, Rectal, Q6H PRN, Eileen Stanford, PA-C .  albuterol (PROVENTIL) (2.5 MG/3ML) 0.083% nebulizer solution 2.5 mg, 2.5 mg, Nebulization, Q6H PRN, Coniglio, Amanda C, MD, 2.5 mg at 01/15/19 2034 .  ALPRAZolam Duanne Moron) tablet 0.5 mg, 0.5 mg, Oral, QHS PRN, Eileen Stanford, PA-C, 0.5 mg at 01/11/19 2124 .  amLODipine (NORVASC) tablet 10 mg, 10 mg, Oral, QHS, Eileen Stanford, PA-C, 10 mg at 01/15/19 2215 .  aspirin EC tablet 81 mg, 81 mg, Oral, Daily, Eileen Stanford, PA-C, 81 mg at 01/16/19 1112 .  carvedilol (COREG) tablet 6.25 mg, 6.25 mg, Oral, BID WC, Eileen Stanford, PA-C, 6.25 mg at 01/16/19 7846 .  clopidogrel (PLAVIX) tablet 75 mg, 75 mg, Oral, Q breakfast, Eileen Stanford, PA-C, 75 mg at 01/16/19 9629 .  ferrous sulfate tablet 325 mg, 325 mg, Oral, Deirdre Peer, PA-C, 325 mg at 01/16/19 1112 .  fluticasone (FLONASE) 50 MCG/ACT nasal spray 1 spray, 1 spray, Each Nare, Daily, Lujean Amel, MD, 1 spray at 01/16/19 1114 .  furosemide (LASIX) tablet 20 mg, 20 mg, Oral, Daily, Croitoru, Mihai,  MD, 20 mg at 01/16/19 1112 .  gabapentin (NEURONTIN) capsule 200 mg, 200 mg, Oral, BID, Greta Doom, MD, 200 mg at 01/15/19 0835 .  glipiZIDE (GLUCOTROL) tablet 10 mg, 10 mg, Oral, QAC breakfast, Croitoru, Mihai, MD, 10 mg at 01/16/19 0829 .  CBG monitoring, , , 4x Daily, AC & HS **AND** insulin aspart (novoLOG) injection 0-24 Units, 0-24 Units,  Subcutaneous, TID AC & HS, Eileen Stanford, PA-C, 12 Units at 01/16/19 1115 .  latanoprost (XALATAN) 0.005 % ophthalmic solution 1 drop, 1 drop, Both Eyes, QHS, Lujean Amel, MD, 1 drop at 01/15/19 2216 .  levothyroxine (SYNTHROID, LEVOTHROID) tablet 88 mcg, 88 mcg, Oral, QAC breakfast, Eileen Stanford, PA-C, 88 mcg at 01/16/19 1638 .  metFORMIN (GLUCOPHAGE) tablet 500 mg, 500 mg, Oral, BID WC, Croitoru, Mihai, MD, 500 mg at 01/16/19 0829 .  metoprolol tartrate (LOPRESSOR) injection 2.5-5 mg, 2.5-5 mg, Intravenous, Q2H PRN, Eileen Stanford, PA-C, 2.5 mg at 01/13/19 1649 .  morphine 2 MG/ML injection 1-4 mg, 1-4 mg, Intravenous, Q1H PRN, Eileen Stanford, PA-C .  ondansetron [facility]) injection 4 mg, 4 mg, Intravenous, Q6H PRN, Eileen Stanford, PA-C, 4 mg at 01/15/19 1557 .  oxyCODONE (Oxy IR/ROXICODONE) immediate release tablet 5-10 mg, 5-10 mg, Oral, Q3H PRN, Eileen Stanford, PA-C .  simvastatin (ZOCOR) tablet 40 mg, 40 mg, Oral, QHS, Rinehuls, David L, PA-C, 40 mg at 01/15/19 2215 .  sodium chloride flush (NS) 0.9 % injection 3 mL, 3 mL, Intravenous, Q12H, Eileen Stanford, PA-C, 3 mL at 01/16/19 1116 .  sodium chloride flush (NS) 0.9 % injection 3 mL, 3 mL, Intravenous, PRN, Angelena Form R, PA-C .  timolol (TIMOPTIC) 0.5 % ophthalmic solution 1 drop, 1 drop, Both Eyes, BID, Lujean Amel, MD, 1 drop at 01/16/19 1113 .  traMADol (ULTRAM) tablet 50-100 mg, 50-100 mg, Oral, Q4H PRN, Eileen Stanford, PA-C, 100 mg at 01/15/19 4665  Patients Current Diet:     Diet Order                  Diet Heart Room service appropriate? Yes; Fluid consistency: Thin  Diet effective now               Precautions / Restrictions Precautions Precautions: Fall Restrictions Weight Bearing Restrictions: No   Has the patient had 2 or more falls or a fall with injury in the past year?Yes  Prior Activity Level Limited Community (1-2x/wk): not working PTA; driving  some; not very active  Development worker, international aid / Paramedic Devices/Equipment: Environmental consultant (specify type), Eyeglasses Home Equipment: Environmental consultant - 4 wheels, Cane - quad, [place] - single point, Civil engineer, contracting  Prior Device Use: Indicate devices/aids used by the patient prior to current illness, exacerbation or injury? Walker  Prior Functional Level Prior Function Level of Independence: Independent with assistive device(s) Comments: uses Rollator  Self Care: Did the patient need help bathing, dressing, using the toilet or eating?  Independent  Indoor Mobility: Did the patient need assistance with walking from room to room (with or without device)? Independent  Stairs: Did the patient need assistance with internal or external stairs (with or without device)? Independent  Functional Cognition: Did the patient need help planning regular tasks such as shopping or remembering to take medications? Needed some help  Current Functional Level Cognition  Arousal/Alertness: Awake/alert Overall Cognitive Status: Impaired/Different from baseline Current Attention Level: Selective Orientation Level: Oriented X4 Following Commands: Follows one step commands with  increased time Safety/Judgement: Decreased awareness of deficits General Comments: continues to show decreased safety awarenss and impuslivity Attention: Selective, Alternating Selective Attention: Impaired Selective Attention Impairment: Verbal basic, Functional basic("i need to turn that tv off") Alternating Attention: Impaired Alternating Attention Impairment: Functional basic(difficulty returning to task after interruptions) Memory: Impaired Memory Impairment: Storage deficit, Decreased short term memory(slow processing, decreased working memory) Decreased Short Term Memory: Verbal basic, Functional basic Awareness: Impaired Awareness Impairment: Other (comment)(splinter skills; pt initially denies deficits) Problem  Solving: Impaired(slow processing) Executive Function: Organizing(clock drawing with poor spacing, numbers placed outside circ) Behaviors: Other (comment)(flat affect) Safety/Judgment: Impaired(pt has fallen with admission)    Extremity Assessment (includes Sensation/Coordination)  Upper Extremity Assessment: RUE deficits/detail, LUE deficits/detail RUE Deficits / Details: Pt with decreased strength and coordination. Pt both overshooting and undershooting during finger to nose test and in ADLs.  RUE Coordination: decreased fine motor, decreased gross motor LUE Deficits / Details: Pt with decreased strength and coordination. Pt both overshooting and undershooting during finger to nose test and in ADLs.  LUE Coordination: decreased fine motor, decreased gross motor  Lower Extremity Assessment: Defer to PT evaluation RLE Coordination: decreased gross motor LLE Coordination: decreased gross motor    ADLs  Overall ADL's : Needs assistance/impaired Eating/Feeding: Supervision/ safety, Set up Grooming: Brushing hair, Wash/dry hands, Wash/dry face, Oral care, Sitting, Standing, Set up, Min guard Grooming Details (indicate cue type and reason): stood to brush teeth x 2 minutes Upper Body Bathing: Minimal assistance, Moderate assistance, Sitting Upper Body Bathing Details (indicate cue type and reason): PATIENT NEEDS OCCASIONALLY MIN GUARD FOR SITTING BALANCE Lower Body Bathing: Moderate assistance, Maximal assistance, +2 for physical assistance, +2 for safety/equipment, Sit to/from stand Lower Body Bathing Details (indicate cue type and reason): PATIENT KNEES ARE BUCKELING IN STANDING.  Upper Body Dressing : Set up, Sitting Upper Body Dressing Details (indicate cue type and reason): (PATIENT WITH JERKY MOVEMENTS) Lower Body Dressing: Set up, Sitting/lateral leans Lower Body Dressing Details (indicate cue type and reason): slip on shoes Toilet Transfer: Minimal assistance, Ambulation, RW,  Regular Toilet Toileting- Clothing Manipulation and Hygiene: Minimal assistance, Sit to/from stand Toileting - Clothing Manipulation Details (indicate cue type and reason): assist to manage mesh panties Functional mobility during ADLs: Minimal assistance, +2 for safety/equipment, Cueing for safety, Rolling walker General ADL Comments: pt with vomiting at end of session, RN notified    Mobility  Overal bed mobility: Needs Assistance Bed Mobility: Supine to Sit Supine to sit: Min guard, HOB elevated Sit to supine: Min guard General bed mobility comments: Min Guard A for safety    Transfers  Overall transfer level: Needs assistance Equipment used: Rolling walker (2 wheeled) Transfers: Sit to/from Stand Sit to Stand: Min assist Stand pivot transfers: Mod assist, +2 physical assistance General transfer comment: Min assist to power up to standing with use of RW    Ambulation / Gait / Stairs / Wheelchair Mobility  Ambulation/Gait Ambulation/Gait assistance: Herbalist (Feet): 200 Feet Assistive device: Rolling walker (2 wheeled), None Gait Pattern/deviations: Step-through pattern, Decreased stride length, Drifts right/left General Gait Details: patient with poor ability to navigate safely. Noted bilateral LE buckling with ambulation, 2 seated rest breaks required due to decreased activity tolerance. Ambulated on 3 liters supplemental O2 with saturations to mid 80s and increased WOB with audible wheezing noted Gait velocity: decreased Gait velocity interpretation: <1.8 ft/sec, indicate of risk for recurrent falls    Posture / Balance Balance Overall balance assessment: Needs assistance Sitting-balance support: Feet  supported Sitting balance-Leahy Scale: Fair Standing balance support: Bilateral upper extremity supported Standing balance-Leahy Scale: Poor Standing balance comment: reliance on UE support during functional task performance    Special needs/care  consideration BiPAP/CPAP: no CPM: no Continuous Drip IV: no Dialysis: no        Days: no Life Vest: no Oxygen: no Special Bed: no Trach Size:no Wound Vac (area): no      Location: no Skin: no areas of concern                      Bowel mgmt: last BM: 01/09/19 Bladder mgmt: purwik in place Diabetic mgmt: yes     Previous Home Environment Living Arrangements: Children(daughter) Available Help at Discharge: Family, Available 24 hours/day Type of Home: House Home Layout: One level Home Access: Stairs to enter Entrance Stairs-Rails: None Technical brewer of Steps: 2 Bathroom Shower/Tub: Multimedia programmer: Standard Home Care Services: No Additional Comments: Reports history of falls once per week   Discharge Living Setting Plans for Discharge Living Setting: Patient's home, Lives with (comment)(up to 5 family members) Type of Home at Discharge: House Discharge Home Layout: One level Discharge Home Access: Stairs to enter Entrance Stairs-Rails: None Entrance Stairs-Number of Steps: 1 step then 1 ledge Discharge Bathroom Shower/Tub: Walk-in shower Discharge Bathroom Toilet: Standard Discharge Bathroom Accessibility: Yes How Accessible: Accessible via walker Does the patient have any problems obtaining your medications?: No  Social/Family/Support Systems Patient Roles: Other (Comment)(very supportive family member) Contact Information: [name] [name]" [phone], [name] (daughter): [phone] Anticipated Caregiver: all 5 family members (DJ present during admission process) Anticipated Caregiver's Contact Information: see above Ability/Limitations of Caregiver: Min A Caregiver Availability: 24/7 Discharge Plan Discussed with Primary Caregiver: Yes Is Caregiver In Agreement with Plan?: Yes Does Caregiver/Family have Issues with Lodging/Transportation while Pt is in Rehab?: No   Goals/Additional Needs Patient/Family Goal for Rehab: PT/OT/SLP: Mod  I Expected length of stay: 10-14 days Cultural Considerations: NA Dietary Needs: Heart Healthy/carb modified; thin liquids Equipment Needs: TBD Pt/Family Agrees to Admission and willing to participate: Yes Program Orientation Provided & Reviewed with Pt/Caregiver Including Roles  & Responsibilities: Yes  Barriers to Discharge: Inaccessible home environment, Home environment access/layout  Barriers to Discharge Comments: steps to enter   Decrease burden of Care through IP rehab admission: NA   Possible need for SNF placement upon discharge: Not anticipated; pt has good social support at home with family willing to assist as needed. Pt has good prognosis for further progress through CIR.    Patient Condition: This patient's medical and functional status has changed since the consult dated: 01/12/19 in which the Rehabilitation Physician determined and documented that the patient's condition is appropriate for intensive rehabilitative care in an inpatient rehabilitation facility. See "History of Present Illness" (above) for medical update. Functional changes are: Improvement in functional transfers from Mod A x2 to Min A and improvement from BLE buckling with deferring gait to Min A. Patient's medical and functional status update has been discussed with the Rehabilitation physician and patient remains appropriate for inpatient rehabilitation. Will admit to inpatient rehab today.  Preadmission Screen Completed By:  Jhonnie Garner, 01/16/2019 4:13 PM ______________________________________________________________________   Discussed status with Dr. Naaman Plummer on 01/16/19 at 4:12PM and received telephone approval for admission today.  Admission Coordinator:  Jhonnie Garner, time 4:12PM/Date 12/16/18       Cosigned by: Meredith Staggers, MD at 01/16/2019 4:45 PM  Revision History

## 2019-01-16 NOTE — Progress Notes (Signed)
Pt was admitted to 4W23. Pt alert and oriented. Vitals are WDL and patient reports no pain. Pt oriented to safety plan and bed alarm is in place.

## 2019-01-16 NOTE — Progress Notes (Signed)
Kirsteins, Luanna Salk, MD  Physician  Physical Medicine and Rehabilitation  Consult Note  Signed  Date of Service:  01/12/2019 1:50 PM       Related encounter: Admission (Discharged) from 01/10/2019 in [facility] 4E CV SURGICAL PROGRESSIVE CARE      Signed      Expand All Collapse All         Physical Medicine and Rehabilitation Consult   Reason for Consult: Debility Referring Physician: Dr.McAlhany    HPI: [name] [name] is a 73 y.o. female with history of chronic diastolic CHF, COPD, S0YT, chronic back pain, gait disorder with falls, RA, moderate to severe AS who was admitted on 01/10/19 for TVAR by Dr. Angelena Form. Post op with BLE instability with inability to walk. 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 CIR recommended due to functional decline.    Review of Systems  Constitutional: Negative for chills and fever.  HENT: Negative for hearing loss.   Eyes: Negative for blurred vision and double vision.  Respiratory: Positive for shortness of breath and wheezing.   Cardiovascular: Negative for chest pain and palpitations.  Gastrointestinal: Positive for constipation. Negative for heartburn and nausea.  Genitourinary: Negative for dysuria and urgency.  Musculoskeletal: Positive for back pain and falls (bilateral knees tend to givewary).  Neurological: Positive for weakness. Negative for dizziness and headaches.  Psychiatric/Behavioral: The patient does not have insomnia.          Past Medical History:  Diagnosis Date  . Anemia   . Anxiety   . Arthritis   . Blood transfusion without reported diagnosis   . Bronchitis   . CHF (congestive heart failure) ([place])   . CKD (chronic kidney disease) stage 3, GFR 30-59 ml/min (HCC) 08/18/2018  . Depression   . Diabetes mellitus   . Diverticulitis   . Glaucoma   . Hyperlipidemia   . Hypertension   . Hypothyroidism   . Macular degeneration   . Melanoma ([place])    left  thigh, 2010  . Osteoarthritis   . Peptic ulcer   . Rheumatoid arthritis(714.0)   . 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 Severe aortic stenosis          Past Surgical History:  Procedure Laterality Date  . ABDOMINAL HYSTERECTOMY  1988  . APPENDECTOMY    . BACK SURGERY  07/2008   fusion  . BACK SURGERY  0/1601   complicated by post-op anemia requiring transfusion  . BREAST SURGERY     right breast biopsy  . CARDIAC CATHETERIZATION    . CATARACT EXTRACTION    . CHOLECYSTECTOMY  1974  . COLONOSCOPY  06/06/2007   Proximal diminutive rectal polyps cold biopsied/removed, otherwise normal rectum/ Left-sided diverticula, the remainder of the colonic mucosa appeared normal. Path-tubular adnemoa.   . COLONOSCOPY  09/14/2012   UXN:ATFTDD polyps-removed as described  above Colonic diverticulosis. Poor prep. next TCS five years.   . COLONOSCOPY N/A 10/29/2016   Procedure: COLONOSCOPY;  Surgeon: Daneil Dolin, MD;  [facility];  Service: Endoscopy;  Laterality: N/A;  2:00 pm  . ESOPHAGOGASTRODUODENOSCOPY  09/2012   Rourk: gastric ulcer, gastric duodenal erosions, small hh. benign bx  . ESOPHAGOGASTRODUODENOSCOPY N/A 10/29/2016   Procedure: ESOPHAGOGASTRODUODENOSCOPY (EGD);  Surgeon: Daneil Dolin, MD;  [facility];  Service: Endoscopy;  Laterality: N/A;  . EYE SURGERY    .  FOOT SURGERY  1994   left   . LUMBAR SPINE SURGERY  08/04/2014   dr Joya Salm  . macular degen    . MALONEY DILATION N/A 10/29/2016   Procedure: Venia Minks DILATION;  Surgeon: Daneil Dolin, MD;  [facility];  Service: Endoscopy;  Laterality: N/A;  . MELANOMA EXCISION  2010  . MULTIPLE EXTRACTIONS WITH ALVEOLOPLASTY N/A 08/17/2018   Procedure: Extraction of tooth #'s 7,8, and 9 with alveoloplasty and gross debridement of  remaining teeth;  Surgeon: Lenn Cal, DDS;  Location: [place];  Service: Oral Surgery;  Laterality: N/A;  . RIGHT/LEFT HEART CATH AND CORONARY ANGIOGRAPHY N/A 08/11/2018   Procedure: RIGHT/LEFT HEART CATH AND CORONARY ANGIOGRAPHY;  Surgeon: Burnell Blanks, MD;  Location: [place] [facility];  Service: Cardiovascular;  Laterality: N/A;  . TEE WITHOUT CARDIOVERSION N/A 01/10/2019   Procedure: TRANSESOPHAGEAL ECHOCARDIOGRAM (TEE);  Surgeon: Burnell Blanks, MD;  Location: [place] [facility];  Service: Open Heart Surgery;  Laterality: N/A;  . TRANSCATHETER AORTIC VALVE REPLACEMENT, TRANSFEMORAL N/A 01/10/2019   Procedure: TRANSCATHETER AORTIC VALVE REPLACEMENT, TRANSFEMORAL;  Surgeon: Burnell Blanks, MD;  Location: [place] [facility];  Service: Open Heart Surgery;  Laterality: N/A;  . YAG LASER APPLICATION     OS         Family History  Problem Relation Age of Onset  . Colon cancer Brother        diagnosed at age 36  . Diabetes Brother   . Hypertension Mother   . Heart failure Mother   . Diabetes Mother   . Glaucoma Mother   . Hypertension Other   . Other Other   . Heart failure Father        passed away of MI in his 19s  . Diabetes Sister   . Hypertension Sister   . Breast cancer Sister   . Heart attack Brother   . Diabetes Sister   . Hypertension Sister   . Other Sister        tumor  . Hypertension Sister   . Hypertension Daughter   . Bipolar disorder Daughter   . Anesthesia problems Neg Hx   . Amblyopia Neg Hx   . Blindness Neg Hx   . Cataracts Neg Hx   . Macular degeneration Neg Hx   . Retinal detachment Neg Hx   . Strabismus Neg Hx   . Retinitis pigmentosa Neg Hx     Social History:   Lives with family. Independent with walker PTA but sedentary. She reports that she has never smoked. She has never used smokeless tobacco. She reports that she does not drink alcohol or use drugs.    Allergies: No  Known Allergies          Medications Prior to Admission  Medication Sig Dispense Refill  . acetaminophen (TYLENOL 8 HOUR ARTHRITIS PAIN) 650 MG CR tablet Take 1,300 mg by mouth every 8 (eight) hours as needed for pain.    Marland Kitchen ALPRAZolam (XANAX) 0.5 MG tablet Take 0.5 mg by mouth at bedtime as needed for sleep.     Marland Kitchen amLODipine (NORVASC) 10 MG tablet Take 1 tablet (10 mg total) by mouth at bedtime. 90 tablet 3  . aspirin EC 81 MG tablet Take 81 mg by mouth daily.     . beta carotene w/minerals (OCUVITE) tablet Take 1 tablet by mouth daily.    . Cholecalciferol ([employer] VIT D 5000 HIGH-POTENCY) 5000 UNITS capsule Take 5,000 Units by mouth  daily.    . Cinnamon 500 MG capsule Take 500 mg by mouth daily.    . ferrous sulfate 325 (65 FE) MG tablet Take 325 mg by mouth every other day.     . fluticasone (FLONASE) 50 MCG/ACT nasal spray Place into both nostrils daily as needed for allergies or rhinitis.    . furosemide (LASIX) 20 MG tablet Take 20 mg by mouth daily.    Marland Kitchen gabapentin (NEURONTIN) 300 MG capsule Take 600 mg by mouth 3 (three) times daily.     Marland Kitchen glipiZIDE (GLUCOTROL) 10 MG tablet Take 10 mg by mouth daily.     . hydrochlorothiazide (HYDRODIURIL) 25 MG tablet Take 25 mg by mouth daily.    Marland Kitchen HYDROcodone-acetaminophen (NORCO) 10-325 MG tablet Take 1 tablet by mouth every 8 (eight) hours as needed for moderate pain.     Marland Kitchen latanoprost (XALATAN) 0.005 % ophthalmic solution Place 1 drop into both eyes at bedtime.     Marland Kitchen levothyroxine (SYNTHROID, LEVOTHROID) 88 MCG tablet Take 88 mcg by mouth daily.    . meloxicam (MOBIC) 15 MG tablet Take 15 mg by mouth daily.    . metFORMIN (GLUCOPHAGE) 1000 MG tablet Take 0.5 tablets (500 mg total) by mouth 2 (two) times daily with a meal. 45 tablet 3  . Omega-3 Fatty Acids (FISH OIL) 1000 MG CAPS Take 1,000 mg by mouth daily.     . potassium chloride SA (K-DUR,KLOR-CON) 20 MEQ tablet Take 20 mEq by mouth daily.     . Prenatal  Vit-Fe Fumarate-FA (PRENATAL MULTIVITAMIN) TABS Take 1 tablet by mouth daily.    . simvastatin (ZOCOR) 20 MG tablet Take 20 mg by mouth at bedtime.      . timolol (TIMOPTIC) 0.5 % ophthalmic solution Place 1 drop into both eyes 2 (two) times daily.     Marland Kitchen trolamine salicylate (ASPERCREME) 10 % cream Apply 1 application topically as needed for muscle pain.    . Turmeric 500 MG TABS Take 500 mg by mouth daily.    . VENTOLIN HFA 108 (90 Base) MCG/ACT inhaler Inhale 2 puffs into the lungs every 4 (four) hours as needed for wheezing or shortness of breath.   0  . vitamin B-12 (CYANOCOBALAMIN) 1000 MCG tablet Take 1,000 mcg by mouth daily.    . vitamin C (ASCORBIC ACID) 500 MG tablet Take 500 mg by mouth daily.    . vitamin E 400 UNIT capsule Take 400 Units by mouth daily.    . cimetidine (TAGAMET) 200 MG tablet Take 200 mg by mouth daily as needed (acid reflux/indigestion.).       Home: Home Living Family/patient expects to be discharged to:: Private residence Living Arrangements: Children(daughter) Available Help at Discharge: Family, Available 24 hours/day Type of Home: House Home Access: Stairs to enter Technical brewer of Steps: 2 Entrance Stairs-Rails: None Home Layout: One level Bathroom Shower/Tub: Multimedia programmer: [place]: Environmental consultant - 4 wheels, Cane - quad, Radio producer - single point, Electronics engineer Comments: Reports history of falls once per week   Functional History: Prior Function Level of Independence: Independent with assistive device(s) Comments: uses Rollator Functional Status:  Mobility: Bed Mobility Overal bed mobility: Needs Assistance Bed Mobility: Supine to Sit Sit to supine: Min guard Transfers Overall transfer level: Needs assistance Equipment used: Rolling walker (2 wheeled) Transfers: Sit to/from Stand, [employer] Transfers Sit to Stand: Mod assist, +2 physical assistance, Max assist Stand pivot  transfers: Mod assist, +2 physical assistance General transfer  comment: Requiring modA + 2 to stand at edge of bed. Once standing pt with jerking of both legs, causing bilateral knee buckle and maxA to assist pt back onto bed to prevent fall. Requiring modA + 2 to stand pivot without walker over to chair with bilateral knee block Ambulation/Gait General Gait Details: deferred  ADL:  Cognition: Cognition Overall Cognitive Status: Impaired/Different from baseline Orientation Level: Oriented X4, Oriented to person, Oriented to place, Oriented to time Cognition Arousal/Alertness: Awake/alert Behavior During Therapy: WFL for tasks assessed/performed Overall Cognitive Status: Impaired/Different from baseline Area of Impairment: Safety/judgement Safety/Judgement: Decreased awareness of deficits   Blood pressure (!) 134/50, pulse 64, temperature 98.7 F (37.1 C), temperature source Oral, resp. rate 13, height 4\' 9"  (1.448 m), weight 65.9 kg, SpO2 96 %. Physical Exam  Nursing note and vitals reviewed. Constitutional: She is oriented to person, place, and time. She appears well-developed and well-nourished. She is easily aroused.  HENT:  Head: Normocephalic and atraumatic.  Eyes: Pupils are equal, round, and reactive to light. Conjunctivae are normal.  Cardiovascular: Normal rate and regular rhythm.  Respiratory: Effort normal and breath sounds normal. No respiratory distress. She has no wheezes.  GI: Soft. Bowel sounds are normal. She exhibits distension. There is no abdominal tenderness.  Neurological: She is alert, oriented to person, place, and time and easily aroused.  Mildly disoriented. Able to follow motor commands with minimal cues and encouragement.  4/5 bilateral deltoid bicep tricep grip hip flexor knee extensor ankle dorsiflexor Negative straight leg raising Sensation intact light touch in bilateral upper and lower limbs    Skin: Skin is warm and dry.  Psychiatric: Her  affect is blunt. Her speech is delayed and slurred. She is slowed.    LabResultsLast24Hours  Results for orders placed or performed during the hospital encounter of 01/10/19 (from the past 24 hour(s))  Glucose, capillary     Status: Abnormal   Collection Time: 01/11/19  4:16 PM  Result Value Ref Range   Glucose-Capillary 196 (H) 70 - 99 mg/dL   Comment 1 Notify RN   Glucose, capillary     Status: Abnormal   Collection Time: 01/11/19  9:15 PM  Result Value Ref Range   Glucose-Capillary 193 (H) 70 - 99 mg/dL  CBC     Status: Abnormal   Collection Time: 01/12/19  4:56 AM  Result Value Ref Range   WBC 8.5 4.0 - 10.5 K/uL   RBC 3.21 (L) 3.87 - 5.11 MIL/uL   Hemoglobin 9.2 (L) 12.0 - 15.0 g/dL   HCT 28.6 (L) 36.0 - 46.0 %   MCV 89.1 80.0 - 100.0 fL   MCH 28.7 26.0 - 34.0 pg   MCHC 32.2 30.0 - 36.0 g/dL   RDW 14.8 11.5 - 15.5 %   Platelets 160 150 - 400 K/uL   nRBC 0.0 0.0 - 0.2 %  Basic metabolic panel     Status: Abnormal   Collection Time: 01/12/19  4:56 AM  Result Value Ref Range   Sodium 137 135 - 145 mmol/L   Potassium 3.5 3.5 - 5.1 mmol/L   Chloride 98 98 - 111 mmol/L   CO2 30 22 - 32 mmol/L   Glucose, Bld 130 (H) 70 - 99 mg/dL   BUN 29 (H) 8 - 23 mg/dL   Creatinine, Ser 2.08 (H) 0.44 - 1.00 mg/dL   Calcium 7.9 (L) 8.9 - 10.3 mg/dL   GFR calc non Af Amer 23 (L) >60 mL/min   GFR calc Af  Amer 27 (L) >60 mL/min   Anion gap 9 5 - 15  Glucose, capillary     Status: Abnormal   Collection Time: 01/12/19  6:02 AM  Result Value Ref Range   Glucose-Capillary 125 (H) 70 - 99 mg/dL  Glucose, capillary     Status: Abnormal   Collection Time: 01/12/19 11:39 AM  Result Value Ref Range   Glucose-Capillary 145 (H) 70 - 99 mg/dL   Comment 1 Notify RN    Comment 2 Document in Chart       ImagingResults(Last48hours)  Dg Chest Port 1 View  Result Date: 01/10/2019 CLINICAL DATA:  Status post aortic valve replacement. Assess  life-support lines. EXAM: PORTABLE CHEST 1 VIEW COMPARISON:  Chest radiograph January 09, 2019 FINDINGS: Interval aortic valve replacement. Stable cardiomegaly. Calcified aortic arch. Fullness of the RIGHT pulmonary hila most compatible with vascular shadows. Stable LEFT lower lung zone scarring. No pleural effusion or focal consolidation. No pneumothorax. RIGHT internal jugular central venous catheter distal tip projects in distal superior vena cava. Lumbar spine hardware. IMPRESSION: 1. RIGHT internal jugular central venous catheter distal tip projects in distal superior vena cava. No pneumothorax. 2. Interval aortic valve replacement. 3. Stable cardiomegaly.  No acute pulmonary process. Electronically Signed   By: Elon Alas M.D.   On: 01/10/2019 16:56    Assessment/Plan: Diagnosis: Deconditioning after TAVR 1. Does the need for close, 24 hr/day medical supervision in concert with the patient's rehab needs make it unreasonable for this patient to be served in a less intensive setting? Yes 2. Co-Morbidities requiring supervision/potential complications: Anemia, cute exacerbation of chronic kidney disease 3. Due to bladder management, bowel management, safety, skin/wound care, disease management, medication administration, pain management and patient education, does the patient require 24 hr/day rehab nursing? Yes 4. Does the patient require coordinated care of a physician, rehab nurse, PT (1-2 hrs/day, 5 days/week), OT (1-2 hrs/day, 5 days/week) and SLP (.5-1 hrs/day, 5 days/week) to address physical and functional deficits in the context of the above medical diagnosis(es)? Yes Addressing deficits in the following areas: balance, endurance, locomotion, strength, transferring, bowel/bladder control, bathing, dressing, feeding, grooming, toileting, cognition and psychosocial support 5. Can the patient actively participate in an intensive therapy program of at least 3 hrs of therapy per day at least  5 days per week? Yes 6. The potential for patient to make measurable gains while on inpatient rehab is good 7. Anticipated functional outcomes upon discharge from inpatient rehab are modified independent  with PT, modified independent with OT, modified independent with SLP. 8. Estimated rehab length of stay to reach the above functional goals is: 10-14d 9. Anticipated D/C setting: Home 10. Anticipated post D/C treatments: [facility] therapy 11. Overall Rehab/Functional Prognosis: good  RECOMMENDATIONS: This patient's condition is appropriate for continued rehabilitative care in the following setting: CIR Patient has agreed to participate in recommended program. Yes Note that insurance prior authorization may be required for reimbursement for recommended care.  Comment:  "I have personally performed a face to face diagnostic evaluation of this patient.  Additionally, I have reviewed and concur with the physician assistant's documentation above." Charlett Blake M.D. [facility] FAAPM&R (Sports Med, Neuromuscular Med) Diplomate Am Board of [place], PA-C 01/12/2019        Revision History                   Routing History

## 2019-01-16 NOTE — Evaluation (Signed)
Speech Language Pathology Evaluation Patient Details Name: [name] [name] MRN: [id number] DOB: 28-Sep-1946 Today's Date: 01/16/2019 Time: 7619-5093 SLP Time Calculation (min) (ACUTE ONLY): 32 min  Problem List:  Patient Active Problem List   Diagnosis Date Noted  . Embolic stroke ([place]) 26/71/2458  . AKI (acute kidney injury) ([place]) 01/15/2019  . Drug toxicity 01/15/2019  . Intracranial carotid stenosis 01/15/2019  . 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 Severe aortic stenosis   . Rheumatoid arthritis ([place])   . Hypothyroidism   . Diabetes mellitus, type 2 ([place])   . Chronic diastolic CHF (congestive heart failure) ([place]) 08/18/2018  . CKD (chronic kidney disease) stage 3, GFR 30-59 ml/min (HCC) 08/18/2018  . Frequent falls 02/02/2018  . History of gastric ulcer 10/16/2016  . Dysphagia 10/16/2016  . Spondylolisthesis of lumbar region 08/03/2014  . Essential hypertension 08/02/2014  . Hyperlipidemia 08/02/2014  . Primary osteoarthritis of both knees 06/14/2014  . Right leg weakness 10/31/2013  . H/O adenomatous polyp of colon 08/17/2012  . Family history of colon cancer 08/17/2012  . Chronic nausea 08/17/2012   Past Medical History:  Past Medical History:  Diagnosis Date  . Anemia   . Anxiety   . Arthritis   . Blood transfusion without reported diagnosis   . Bronchitis   . CHF (congestive heart failure) ([place])   . CKD (chronic kidney disease) stage 3, GFR 30-59 ml/min (HCC) 08/18/2018  . Depression   . Diabetes mellitus   . Diverticulitis   . Glaucoma   . Hyperlipidemia   . Hypertension   . Hypothyroidism   . Macular degeneration   . Melanoma ([place])    left thigh, 2010  . Osteoarthritis   . Peptic ulcer   . Rheumatoid arthritis(714.0)   . 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 Severe aortic stenosis    Past  Surgical History:  Past Surgical History:  Procedure Laterality Date  . ABDOMINAL HYSTERECTOMY  1988  . APPENDECTOMY    . BACK SURGERY  07/2008   fusion  . BACK SURGERY  0/9983   complicated by post-op anemia requiring transfusion  . BREAST SURGERY     right breast biopsy  . CARDIAC CATHETERIZATION    . CATARACT EXTRACTION    . CHOLECYSTECTOMY  1974  . COLONOSCOPY  06/06/2007   Proximal diminutive rectal polyps cold biopsied/removed, otherwise normal rectum/ Left-sided diverticula, the remainder of the colonic mucosa appeared normal. Path-tubular adnemoa.   . COLONOSCOPY  09/14/2012   JAS:NKNLZJ polyps-removed as described  above Colonic diverticulosis. Poor prep. next TCS five years.   . COLONOSCOPY N/A 10/29/2016   Procedure: COLONOSCOPY;  Surgeon: Daneil Dolin, MD;  [facility];  Service: Endoscopy;  Laterality: N/A;  2:00 pm  . ESOPHAGOGASTRODUODENOSCOPY  09/2012   Rourk: gastric ulcer, gastric duodenal erosions, small hh. benign bx  . ESOPHAGOGASTRODUODENOSCOPY N/A 10/29/2016   Procedure: ESOPHAGOGASTRODUODENOSCOPY (EGD);  Surgeon: Daneil Dolin, MD;  [facility];  Service: Endoscopy;  Laterality: N/A;  . EYE SURGERY    . FOOT SURGERY  1994   left   . LUMBAR SPINE SURGERY  08/04/2014   dr Joya Salm  . macular degen    . MALONEY DILATION N/A 10/29/2016   Procedure: Venia Minks DILATION;  Surgeon: Daneil Dolin, MD;  [facility];  Service: Endoscopy;  Laterality: N/A;  . MELANOMA EXCISION  2010  . MULTIPLE EXTRACTIONS WITH ALVEOLOPLASTY N/A 08/17/2018   Procedure: Extraction of tooth #'s 7,8, and 9 with alveoloplasty and gross debridement of remaining teeth;  Surgeon: Lenn Cal, DDS;  Location: [facility];  Service: Oral Surgery;  Laterality: N/A;  . RIGHT/LEFT HEART CATH AND CORONARY ANGIOGRAPHY N/A 08/11/2018   Procedure: RIGHT/LEFT HEART CATH AND CORONARY ANGIOGRAPHY;  Surgeon: Burnell Blanks, MD;  Location: [facility];   Service: Cardiovascular;  Laterality: N/A;  . TEE WITHOUT CARDIOVERSION N/A 01/10/2019   Procedure: TRANSESOPHAGEAL ECHOCARDIOGRAM (TEE);  Surgeon: Burnell Blanks, MD;  Location: [facility];  Service: Open Heart Surgery;  Laterality: N/A;  . TRANSCATHETER AORTIC VALVE REPLACEMENT, TRANSFEMORAL N/A 01/10/2019   Procedure: TRANSCATHETER AORTIC VALVE REPLACEMENT, TRANSFEMORAL;  Surgeon: Burnell Blanks, MD;  Location: [facility];  Service: Open Heart Surgery;  Laterality: N/A;  . TRANSCATHETER AORTIC VALVE REPLACEMENT, TRANSFEMORAL  12/2018  . YAG LASER APPLICATION     OS   HPI:  Pt is a 73 y.o. F with significant PMH of CHF, DM2, CKD, RA who is now s/p transcatheter aortic valve replacement. 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    Assessment / Plan / Recommendation Clinical Impression   Patient presents with mild cognitive impairment per [facility], however this SLP judges pt's deficits to be more moderate in nature. She scored 21/30 on MOCA- Basic (26 and above is considered WNL). Deficits include slow processing, selective and higher level attention, working memory, recall, organization and awareness. Immediate recall 2/5 words, delayed recall 0/5. Pt did not recall SLP asking her to remember words after 5 min delay. Pt intermittently aware of attention deficit (requesting to turn off TV when she could not recall what SLP asked her to do), however has overall decreased awareness of deficits and safety. Pt has goal to return home and be more independent. Recommend skilled ST to address cognitive deficits, improve safety and independence.     SLP Assessment  SLP Recommendation/Assessment: Patient needs continued Speech Lanaguage Pathology Services SLP Visit Diagnosis: Cognitive communication deficit (R41.841)    Follow Up Recommendations  Inpatient Rehab    Frequency and Duration min 2x/week  2 weeks      SLP  Evaluation Cognition  Overall Cognitive Status: Impaired/Different from baseline Arousal/Alertness: Awake/alert Orientation Level: Oriented X4 Attention: Selective;Alternating Selective Attention: Impaired Selective Attention Impairment: Verbal basic;Functional basic("i need to turn that tv off") Alternating Attention: Impaired Alternating Attention Impairment: Functional basic(difficulty returning to task after interruptions) Memory: Impaired Memory Impairment: Storage deficit;Decreased short term memory(slow processing, decreased working memory) Decreased Short Term Memory: Verbal basic;Functional basic Awareness: Impaired Awareness Impairment: Other (comment)(splinter skills; pt initially denies deficits) Problem Solving: Impaired(slow processing) Executive Function: Organizing(clock drawing with poor spacing, numbers placed outside circ) Behaviors: Other (comment)(flat affect) Safety/Judgment: Impaired(pt has fallen with admission)       Comprehension  Auditory Comprehension Overall Auditory Comprehension: Appears within functional limits for tasks assessed(repetition needed due to slow processing, attention) Yes/No Questions: Within Functional Limits Conversation: Simple Interfering Components: Attention;Processing speed;Working Field seismologist: Conservation officer, nature: Within [employer] Reading Comprehension Reading Status: Within funtional limits(mod complex paragraph)    Expression Expression Primary Mode of Expression: Verbal Verbal Expression Overall Verbal Expression: Appears within functional limits for tasks assessed Initiation: No impairment Automatic Speech: Name;Social Response Level of Generative/Spontaneous Verbalization: Conversation(hesitations occasionally (attention)) Naming: No impairment Pragmatics: Impairment Impairments: Abnormal affect Non-Verbal Means of Communication: Not  applicable Written Expression  Dominant Hand: Right Written Expression: Within Functional Limits(wrote 2 sentences; 1 semantic error when distracted, unaware)   Oral / Motor  Oral Motor/Sensory Function Overall Oral Motor/Sensory Function: Mild impairment Facial Symmetry: Abnormal symmetry right Motor Speech Overall Motor Speech: Appears within functional limits for tasks assessed   [place], [place], [place] Pager: [phone] Office: [phone]  Aliene Altes 01/16/2019, 3:44 PM

## 2019-01-16 NOTE — Progress Notes (Signed)
Pt ambulated hallway with PT. After making it back to room, pt began vomiting. Notified MD. Pa will come see pt. Jerald Kief, RN

## 2019-01-16 NOTE — Progress Notes (Signed)
Occupational Therapy Treatment Patient Details Name: [name] [name] MRN: [id number] DOB: 24-Mar-1946 Today's Date: 01/16/2019    History of present illness Pt is a 73 y.o. F with significant PMH of CHF, DM2, CKD, RA who is now s/p transcatheter aortic valve replacement. 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     OT comments  Pt progressing steadily. Demonstrating impulsivity and decreased safety awareness with mobility. Performed one standing grooming activity with min guard assist, 2 more seated at sink. Min assist for toileting. Continues to be appropriate for CIR.  Follow Up Recommendations  CIR    Equipment Recommendations  None recommended by OT    Recommendations for Other Services      Precautions / Restrictions Precautions Precautions: Fall Restrictions Weight Bearing Restrictions: No       Mobility Bed Mobility Overal bed mobility: Needs Assistance Bed Mobility: Supine to Sit     Supine to sit: Min guard;HOB elevated    General bed mobility comments: Min Guard A for safety  Transfers Overall transfer level: Needs assistance Equipment used: Rolling walker (2 wheeled) Transfers: Sit to/from Stand Sit to Stand: Min assist         General transfer comment: Min assist to power up to standing with use of RW    Balance Overall balance assessment: Needs assistance Sitting-balance support: Feet supported Sitting balance-Leahy Scale: Fair     Standing balance support: Bilateral upper extremity supported Standing balance-Leahy Scale: Poor Standing balance comment: reliance on UE support during functional task performance                           ADL either performed or assessed with clinical judgement   ADL Overall ADL's : Needs assistance/impaired     Grooming: Brushing hair;Wash/dry hands;Wash/dry face;Oral care;Sitting;Standing;Set up;Min guard Grooming Details (indicate cue type and reason):  stood to brush teeth x 2 minutes         Upper Body Dressing : Set up;Sitting   Lower Body Dressing: Set up;Sitting/lateral leans Lower Body Dressing Details (indicate cue type and reason): slip on shoes Toilet Transfer: Minimal assistance;Ambulation;RW;Regular Toilet   Toileting- Clothing Manipulation and Hygiene: Minimal assistance;Sit to/from stand Toileting - Clothing Manipulation Details (indicate cue type and reason): assist to manage mesh panties     Functional mobility during ADLs: Minimal assistance;+2 for safety/equipment;Cueing for safety;Rolling walker General ADL Comments: pt with vomiting at end of session, RN notified     Vision       Perception     Praxis      Cognition Arousal/Alertness: Awake/alert Behavior During Therapy: Flat affect;Impulsive Overall Cognitive Status: Impaired/Different from baseline Area of Impairment: Safety/judgement;Problem solving;Awareness;Following commands;Attention                   Current Attention Level: Selective   Following Commands: Follows one step commands with increased time Safety/Judgement: Decreased awareness of deficits Awareness: Emergent Problem Solving: Slow processing;Requires verbal cues General Comments: continues to show decreased safety awarenss and impuslivity        Exercises Exercises: General Lower Extremity;Other exercises   Shoulder Instructions       General Comments vomitted x2 during session nsg aware    Pertinent Vitals/ Pain       Pain Assessment: No/denies pain  Home Living  Prior Functioning/Environment              Frequency  Min 2X/week        Progress Toward Goals  OT Goals(current goals can now be found in the care plan section)  Progress towards OT goals: Progressing toward goals  Acute Rehab OT Goals Patient Stated Goal: "I was excited to go home." OT Goal Formulation: With patient Time For  Goal Achievement: 01/27/19 Potential to Achieve Goals: Good  Plan Discharge plan remains appropriate    Co-evaluation                 AM-PAC OT "6 Clicks" Daily Activity     Outcome Measure   Help from another person eating meals?: None Help from another person taking care of personal grooming?: A Little Help from another person toileting, which includes using toliet, bedpan, or urinal?: A Little Help from another person bathing (including washing, rinsing, drying)?: A Little Help from another person to put on and taking off regular upper body clothing?: A Little Help from another person to put on and taking off regular lower body clothing?: A Lot 6 Click Score: 18    End of Session Equipment Utilized During Treatment: Gait belt;Rolling walker  OT Visit Diagnosis: Unsteadiness on feet (R26.81);Repeated falls (R29.6);Muscle weakness (generalized) (M62.81);History of falling (Z91.81)   Activity Tolerance Patient tolerated treatment well   Patient Left in chair;with call bell/phone within reach;with chair alarm set   Nurse Communication Other (comment)(vomiting)        Time: 1601-0932 OT Time Calculation (min): 34 min  Charges: OT General Charges $OT Visit: 1 Visit OT Treatments $Self Care/Home Management : 8-22 mins  Nestor Lewandowsky, OTR/L Acute Rehabilitation Services Pager: [phone] Office: [phone]   Malka So 01/16/2019, 10:29 AM

## 2019-01-16 NOTE — Progress Notes (Signed)
SLP Cancellation Note  Patient Details Name: [name] [name] MRN: [id number] DOB: 02/11/1946   Cancelled treatment:       Reason Eval/Treat Not Completed: Other (comment). With Dr. Naaman Plummer discussing potential rehab admission, will reattempt cognitive-linguistic assessment as schedule allows.  Deneise Lever, [place], [place] Speech-Language Pathologist Acute Rehabilitation Services Pager: [phone] Office: [phone]    Aliene Altes 01/16/2019, 2:18 PM

## 2019-01-16 NOTE — Progress Notes (Signed)
Inpatient Rehabilitation-Admissions Coordinator   [facility] has received insurance approval for admit to CIR on 01/16/19. Pt and family have agreed with admission to CIR. AC has updated RN and CM regarding plan.   Please call if questions.   Jhonnie Garner, OTR/L  Rehab Admissions Coordinator  [phone] 01/16/2019 4:16 PM

## 2019-01-16 NOTE — H&P (Addendum)
Physical Medicine and Rehabilitation Admission H&P     CC: Stroke with functional deficits.      HPI: [name] [name] is a 73 year old female with history of chronic diastolic CHF, COPD, L3TD, chronic LBP, gait disorder with instability, moderate to severe left AS who was admitted on 01/10/2019 TVAR by Dr. Caryl Bis.  Postop noted to have issues with bilateral lower extremity weakness with myoclonic jerks, BUE dysmetria and inability to walk.  Also noted to have lethargy with asterixis therefore neurology consulted for input.  Dr. Leonel Ramsay consulted and felt that patient with gabapentin toxicity due to increase in dose in setting of acute on chronic renal failure which was likely contributing to the symptoms but recommended neurological work up. MRI brain revealing multiple small areas of acute infarct in bilateral cerebral hemispheres c/w cardioembolism. CTA head/neck was negative for emergent large vessel occlusion and showed moderate to server R-ICA and moderate L-ICA stenosis.      Neurology felt that strokes were related to [facility] --no further work up indicated and recommended following up with [facility] on outpatient basis. Asterixis and lethargy has resolved. She continue to have weakness with poor safety awareness and impulsivity, mild cognitive impairments,   that is affecting mobility and ADL tasks. CIR recommended due to functional decline.      Review of Systems  Constitutional: Negative for chills and fever.  HENT: Negative for hearing loss and tinnitus.   Eyes: Negative for blurred vision and double vision.  Respiratory: Positive for shortness of breath and wheezing. Negative for cough.   Cardiovascular: Negative for chest pain and palpitations.  Gastrointestinal: Negative for abdominal pain, constipation, heartburn and nausea.  Genitourinary: Negative for dysuria.  Musculoskeletal: Positive for back pain and falls (due to BLE instability. ).  Skin: Negative for itching and  rash.  Neurological: Positive for weakness. Negative for dizziness and headaches.  Psychiatric/Behavioral: The patient is not nervous/anxious.           Past Medical History:  Diagnosis Date  . Anemia    . Anxiety    . Arthritis    . Blood transfusion without reported diagnosis    . Bronchitis    . CHF (congestive heart failure) ([place])    . CKD (chronic kidney disease) stage 3, GFR 30-59 ml/min (HCC) 08/18/2018  . Depression    . Diabetes mellitus    . Diverticulitis    . Glaucoma    . Hyperlipidemia    . Hypertension    . Hypothyroidism    . Macular degeneration    . Melanoma ([place])      left thigh, 2010  . Osteoarthritis    . Peptic ulcer    . Rheumatoid arthritis(714.0)    . 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 Severe aortic stenosis             Past Surgical History:  Procedure Laterality Date  . ABDOMINAL HYSTERECTOMY   1988  . APPENDECTOMY      . BACK SURGERY   07/2008    fusion  . BACK SURGERY   03/2875    complicated by post-op anemia requiring transfusion  . BREAST SURGERY        right breast biopsy  . CARDIAC CATHETERIZATION      . CATARACT EXTRACTION      . CHOLECYSTECTOMY   1974  .  COLONOSCOPY   06/06/2007    Proximal diminutive rectal polyps cold biopsied/removed, otherwise normal rectum/ Left-sided diverticula, the remainder of the colonic mucosa appeared normal. Path-tubular adnemoa.   . COLONOSCOPY   09/14/2012    PPI:RJJOAC polyps-removed as described  above Colonic diverticulosis. Poor prep. next TCS five years.   . COLONOSCOPY N/A 10/29/2016    Procedure: COLONOSCOPY;  Surgeon: Daneil Dolin, MD;  [facility];  Service: Endoscopy;  Laterality: N/A;  2:00 pm  . ESOPHAGOGASTRODUODENOSCOPY   09/2012    Rourk: gastric ulcer, gastric duodenal erosions, small hh. benign bx  . ESOPHAGOGASTRODUODENOSCOPY N/A  10/29/2016    Procedure: ESOPHAGOGASTRODUODENOSCOPY (EGD);  Surgeon: Daneil Dolin, MD;  [facility];  Service: Endoscopy;  Laterality: N/A;  . EYE SURGERY      . FOOT SURGERY   1994    left   . LUMBAR SPINE SURGERY   08/04/2014    dr Joya Salm  . macular degen      . MALONEY DILATION N/A 10/29/2016    Procedure: Venia Minks DILATION;  Surgeon: Daneil Dolin, MD;  [facility];  Service: Endoscopy;  Laterality: N/A;  . MELANOMA EXCISION   2010  . MULTIPLE EXTRACTIONS WITH ALVEOLOPLASTY N/A 08/17/2018    Procedure: Extraction of tooth #'s 7,8, and 9 with alveoloplasty and gross debridement of remaining teeth;  Surgeon: Lenn Cal, DDS;  Location: [facility];  Service: Oral Surgery;  Laterality: N/A;  . RIGHT/LEFT HEART CATH AND CORONARY ANGIOGRAPHY N/A 08/11/2018    Procedure: RIGHT/LEFT HEART CATH AND CORONARY ANGIOGRAPHY;  Surgeon: Burnell Blanks, MD;  Location: [facility];  Service: Cardiovascular;  Laterality: N/A;  . TEE WITHOUT CARDIOVERSION N/A 01/10/2019    Procedure: TRANSESOPHAGEAL ECHOCARDIOGRAM (TEE);  Surgeon: Burnell Blanks, MD;  Location: [facility];  Service: Open Heart Surgery;  Laterality: N/A;  . TRANSCATHETER AORTIC VALVE REPLACEMENT, TRANSFEMORAL N/A 01/10/2019    Procedure: TRANSCATHETER AORTIC VALVE REPLACEMENT, TRANSFEMORAL;  Surgeon: Burnell Blanks, MD;  Location: [facility];  Service: Open Heart Surgery;  Laterality: N/A;  . TRANSCATHETER AORTIC VALVE REPLACEMENT, TRANSFEMORAL   12/2018  . YAG LASER APPLICATION        OS           Family History  Problem Relation Age of Onset  . Colon cancer Brother          diagnosed at age 74  . Diabetes Brother    . Hypertension Mother    . Heart failure Mother    . Diabetes Mother    . Glaucoma Mother    . Hypertension Other    . Other Other    . Heart failure Father          passed away of MI in his 13s  . Diabetes Sister    . Hypertension Sister      . Breast cancer Sister    . Heart attack Brother    . Diabetes Sister    . Hypertension Sister    . Other Sister          tumor  . Hypertension Sister    . Hypertension Daughter    . Bipolar disorder Daughter    . Anesthesia problems Neg Hx    . Amblyopia Neg Hx    . Blindness Neg Hx    . Cataracts Neg Hx    . Macular degeneration Neg Hx    . Retinal detachment Neg  Hx    . Strabismus Neg Hx    . Retinitis pigmentosa Neg Hx        Social History:  Lives with family. Independent with rollator but sedentary. She reports that she has never smoked. She has never used smokeless tobacco. She reports that she does not drink alcohol or use drugs.      Allergies: No Known Allergies            Medications Prior to Admission  Medication Sig Dispense Refill  . acetaminophen (TYLENOL 8 HOUR ARTHRITIS PAIN) 650 MG CR tablet Take 1,300 mg by mouth every 8 (eight) hours as needed for pain.      Marland Kitchen ALPRAZolam (XANAX) 0.5 MG tablet Take 0.5 mg by mouth at bedtime as needed for sleep.       Marland Kitchen amLODipine (NORVASC) 10 MG tablet Take 1 tablet (10 mg total) by mouth at bedtime. 90 tablet 3  . aspirin EC 81 MG tablet Take 81 mg by mouth daily.       . beta carotene w/minerals (OCUVITE) tablet Take 1 tablet by mouth daily.      . Cholecalciferol (CVS VIT D 5000 HIGH-POTENCY) 5000 UNITS capsule Take 5,000 Units by mouth daily.      . Cinnamon 500 MG capsule Take 500 mg by mouth daily.      . ferrous sulfate 325 (65 FE) MG tablet Take 325 mg by mouth every other day.       . fluticasone (FLONASE) 50 MCG/ACT nasal spray Place into both nostrils daily as needed for allergies or rhinitis.      . furosemide (LASIX) 20 MG tablet Take 20 mg by mouth daily.      Marland Kitchen glipiZIDE (GLUCOTROL) 10 MG tablet Take 10 mg by mouth daily.       . hydrochlorothiazide (HYDRODIURIL) 25 MG tablet Take 25 mg by mouth daily.      Marland Kitchen HYDROcodone-acetaminophen (NORCO) 10-325 MG tablet Take 1 tablet by mouth every 8 (eight) hours as  needed for moderate pain.       Marland Kitchen latanoprost (XALATAN) 0.005 % ophthalmic solution Place 1 drop into both eyes at bedtime.       Marland Kitchen levothyroxine (SYNTHROID, LEVOTHROID) 88 MCG tablet Take 88 mcg by mouth daily.      . meloxicam (MOBIC) 15 MG tablet Take 15 mg by mouth daily.      . metFORMIN (GLUCOPHAGE) 1000 MG tablet Take 0.5 tablets (500 mg total) by mouth 2 (two) times daily with a meal. 45 tablet 3  . Omega-3 Fatty Acids (FISH OIL) 1000 MG CAPS Take 1,000 mg by mouth daily.       . Prenatal Vit-Fe Fumarate-FA (PRENATAL MULTIVITAMIN) TABS Take 1 tablet by mouth daily.      . simvastatin (ZOCOR) 20 MG tablet Take 20 mg by mouth at bedtime.        . timolol (TIMOPTIC) 0.5 % ophthalmic solution Place 1 drop into both eyes 2 (two) times daily.       Marland Kitchen trolamine salicylate (ASPERCREME) 10 % cream Apply 1 application topically as needed for muscle pain.      . Turmeric 500 MG TABS Take 500 mg by mouth daily.      . VENTOLIN HFA 108 (90 Base) MCG/ACT inhaler Inhale 2 puffs into the lungs every 4 (four) hours as needed for wheezing or shortness of breath.    0  . vitamin B-12 (CYANOCOBALAMIN) 1000 MCG tablet Take 1,000 mcg by mouth daily.      Marland Kitchen  vitamin C (ASCORBIC ACID) 500 MG tablet Take 500 mg by mouth daily.      . vitamin E 400 UNIT capsule Take 400 Units by mouth daily.      . [DISCONTINUED] gabapentin (NEURONTIN) 300 MG capsule Take 600 mg by mouth 3 (three) times daily.       . [DISCONTINUED] potassium chloride SA (K-DUR,KLOR-CON) 20 MEQ tablet Take 20 mEq by mouth daily.       . cimetidine (TAGAMET) 200 MG tablet Take 200 mg by mouth daily as needed (acid reflux/indigestion.).           Drug Regimen Review  Drug regimen was reviewed and remains appropriate with no significant issues identified   Home: Home Living Family/patient expects to be discharged to:: Private residence Living Arrangements: Children(daughter) Available Help at Discharge: Family, Available 24 hours/day Type of  Home: House Home Access: Stairs to enter [employer] of Steps: 2 Entrance Stairs-Rails: None Home Layout: One level Bathroom Shower/Tub: Multimedia programmer: Standard Home Equipment: Environmental consultant - 4 wheels, Cane - quad, Radio producer - single point, Electronics engineer Comments: Reports history of falls once per week    Functional History: Prior Function Level of Independence: Independent with assistive device(s) Comments: uses Rollator   Functional Status:  Mobility: Bed Mobility Overal bed mobility: Needs Assistance Bed Mobility: Supine to Sit Supine to sit: Min guard, HOB elevated Sit to supine: Min guard General bed mobility comments: Min Guard A for safety Transfers Overall transfer level: Needs assistance Equipment used: Rolling walker (2 wheeled) Transfers: Sit to/from Stand Sit to Stand: Min assist Stand pivot transfers: Mod assist, +2 physical assistance General transfer comment: Min assist to power up to standing with use of RW Ambulation/Gait Ambulation/Gait assistance: Min assist Gait Distance (Feet): 200 Feet Assistive device: Rolling walker (2 wheeled), None Gait Pattern/deviations: Step-through pattern, Decreased stride length, Drifts right/left General Gait Details: patient with poor ability to navigate safely. Noted bilateral LE buckling with ambulation, 2 seated rest breaks required due to decreased activity tolerance. Ambulated on 3 liters supplemental O2 with saturations to mid 80s and increased WOB with audible wheezing noted Gait velocity: decreased Gait velocity interpretation: <1.8 ft/sec, indicate of risk for recurrent falls   ADL: ADL Overall ADL's : Needs assistance/impaired Eating/Feeding: Supervision/ safety, Set up Grooming: Brushing hair, Wash/dry hands, Wash/dry face, Oral care, Sitting, Standing, Set up, Min guard Grooming Details (indicate cue type and reason): stood to brush teeth x 2 minutes Upper Body Bathing: Minimal  assistance, Moderate assistance, Sitting Upper Body Bathing Details (indicate cue type and reason): PATIENT NEEDS OCCASIONALLY MIN GUARD FOR SITTING BALANCE Lower Body Bathing: Moderate assistance, Maximal assistance, +2 for physical assistance, +2 for safety/equipment, Sit to/from stand Lower Body Bathing Details (indicate cue type and reason): PATIENT KNEES ARE BUCKELING IN STANDING.  Upper Body Dressing : Set up, Sitting Upper Body Dressing Details (indicate cue type and reason): (PATIENT WITH JERKY MOVEMENTS) Lower Body Dressing: Set up, Sitting/lateral leans Lower Body Dressing Details (indicate cue type and reason): slip on shoes Toilet Transfer: Minimal assistance, Ambulation, RW, Regular Toilet Toileting- Clothing Manipulation and Hygiene: Minimal assistance, Sit to/from stand Toileting - Clothing Manipulation Details (indicate cue type and reason): assist to manage mesh panties Functional mobility during ADLs: Minimal assistance, +2 for safety/equipment, Cueing for safety, Rolling walker General ADL Comments: pt with vomiting at end of session, RN notified     Cognition: Cognition Overall Cognitive Status: Impaired/Different from baseline Arousal/Alertness: Awake/alert Orientation Level: Oriented X4 Attention: Selective,  Alternating Selective Attention: Impaired Selective Attention Impairment: Verbal basic, Functional basic("i need to turn that tv off") Alternating Attention: Impaired Alternating Attention Impairment: Functional basic(difficulty returning to task after interruptions) Memory: Impaired Memory Impairment: Storage deficit, Decreased short term memory(slow processing, decreased working memory) Decreased Short Term Memory: Verbal basic, Functional basic Awareness: Impaired Awareness Impairment: Other (comment)(splinter skills; pt initially denies deficits) Problem Solving: Impaired(slow processing) Executive Function: Organizing(clock drawing with poor spacing,  numbers placed outside circ) Behaviors: Other (comment)(flat affect) Safety/Judgment: Impaired(pt has fallen with admission) Cognition Arousal/Alertness: Awake/alert Behavior During Therapy: Flat affect, Impulsive Overall Cognitive Status: Impaired/Different from baseline Area of Impairment: Safety/judgement, Problem solving, Awareness, Following commands, Attention Current Attention Level: Selective Following Commands: Follows one step commands with increased time Safety/Judgement: Decreased awareness of deficits Awareness: Emergent Problem Solving: Slow processing, Requires verbal cues General Comments: continues to show decreased safety awarenss and impuslivity     Blood pressure (!) 129/52, pulse 70, temperature 98.6 F (37 C), temperature source Oral, resp. rate 18, height 4\' 9"  (1.448 m), weight 64.5 kg, SpO2 90 %. Physical Exam  Nursing note and vitals reviewed. Constitutional: She appears well-developed and well-nourished. Nasal cannula in place.  HENT:  Head: Normocephalic and atraumatic.  Eyes: Pupils are equal, round, and reactive to light. EOM are normal.  Neck: Normal range of motion. No thyromegaly present.  Respiratory: She has wheezes.  Audible wheezing during exam.   Musculoskeletal:        General: Edema (1+ pitting edema bilateral shins. ) present.      Lab Results Last 48 Hours        Results for orders placed or performed during the hospital encounter of 01/10/19 (from the past 48 hour(s))  Glucose, capillary     Status: Abnormal    Collection Time: 01/14/19  9:36 PM  Result Value Ref Range    Glucose-Capillary 225 (H) 70 - 99 mg/dL  CBC     Status: Abnormal    Collection Time: 01/15/19  2:54 AM  Result Value Ref Range    WBC 10.6 (H) 4.0 - 10.5 K/uL    RBC 3.62 (L) 3.87 - 5.11 MIL/uL    Hemoglobin 10.5 (L) 12.0 - 15.0 g/dL    HCT 31.7 (L) 36.0 - 46.0 %    MCV 87.6 80.0 - 100.0 fL    MCH 29.0 26.0 - 34.0 pg    MCHC 33.1 30.0 - 36.0 g/dL    RDW 15.0  11.5 - 15.5 %    Platelets 194 150 - 400 K/uL    nRBC 0.0 0.0 - 0.2 %      Comment: Performed at [facility], [place] [address] [place], [place] [address]  Basic metabolic panel     Status: Abnormal    Collection Time: 01/15/19  2:54 AM  Result Value Ref Range    Sodium 140 135 - 145 mmol/L    Potassium 3.6 3.5 - 5.1 mmol/L    Chloride 102 98 - 111 mmol/L    CO2 25 22 - 32 mmol/L    Glucose, Bld 157 (H) 70 - 99 mg/dL    BUN 18 8 - 23 mg/dL    Creatinine, Ser 1.23 (H) 0.44 - 1.00 mg/dL    Calcium 8.8 (L) 8.9 - 10.3 mg/dL    GFR calc non Af Amer 43 (L) >60 mL/min    GFR calc Af Amer 50 (L) >60 mL/min    Anion gap 13 5 - 15  Comment: Performed at [facility], [place] [address] [place], [place] [address]  Glucose, capillary     Status: Abnormal    Collection Time: 01/15/19  6:43 AM  Result Value Ref Range    Glucose-Capillary 219 (H) 70 - 99 mg/dL  Glucose, capillary     Status: Abnormal    Collection Time: 01/15/19 11:17 AM  Result Value Ref Range    Glucose-Capillary 224 (H) 70 - 99 mg/dL  Glucose, capillary     Status: Abnormal    Collection Time: 01/15/19  4:48 PM  Result Value Ref Range    Glucose-Capillary 211 (H) 70 - 99 mg/dL  Glucose, capillary     Status: Abnormal    Collection Time: 01/15/19  8:32 PM  Result Value Ref Range    Glucose-Capillary 176 (H) 70 - 99 mg/dL  Glucose, capillary     Status: Abnormal    Collection Time: 01/15/19 10:02 PM  Result Value Ref Range    Glucose-Capillary 131 (H) 70 - 99 mg/dL  Glucose, capillary     Status: Abnormal    Collection Time: 01/16/19  6:21 AM  Result Value Ref Range    Glucose-Capillary 106 (H) 70 - 99 mg/dL    Comment 1 Notify RN      Comment 2 Document in Chart    Glucose, capillary     Status: Abnormal    Collection Time: 01/16/19 11:04 AM  Result Value Ref Range    Glucose-Capillary 267 (H) 70 - 99 mg/dL  Glucose, capillary     Status: Abnormal    Collection Time: 01/16/19  4:28 PM  Result Value  Ref Range    Glucose-Capillary 250 (H) 70 - 99 mg/dL       Imaging Results (Last 48 hours)  Dg Chest 2 View   Result Date: 01/15/2019 CLINICAL DATA:  Wheezing today. EXAM: CHEST - 2 VIEW COMPARISON:  PA and lateral chest 01/09/2019 and 09/19/2018. CT chest 04/17/2018. FINDINGS: There is cardiomegaly without edema. The patient is status post aortic valve replacement. Lungs are clear. No pneumothorax or pleural fluid. Aortic atherosclerosis noted. No acute or focal bony abnormality. IMPRESSION: No acute disease. Cardiomegaly. Atherosclerosis. Electronically Signed   By: Inge Rise M.D.   On: 01/15/2019 16:42             Medical Problem List and Plan: 1.  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  DVT Prophylaxis/Anticoagulation: Mechanical: Sequential compression devices, below knee Bilateral lower extremities 3. Chronic LBP/Pain Management: Used hydrocodone and gabapentin at home.  4. Mood: LCSW to follow for evaluation and support.  5. Neuropsych: This patient  Is not fully capable of making decisions on her own behalf. 6. Skin/Wound Care: Routine pressure relief  measures 7. Fluids/Electrolytes/Nutrition: Monitor I/O. Check lytes in am. 8. T2DM: Continue to glucotrol and metformin. Will monitor BS ac/hs.  9. COPD: Add flutter valve. Encourage IS and wean oxygen to off. Asking for her inhaler--will add nebs prn for now.  10.  Chronic diastolic CHF: Monitor for signs of fluid overload. Check weight daily to monitor for stability.  On lasix, ASA, Plavix, coreg--will change Zocor to Lipitor to avoid interaction with amlodipine.   11. Acute on CKD: has resolved. Continue to encourage fluid intake. Will recheck in am and serially.  12. HTN: Monitor BP bid. Norvasc added on 1/31 for better  control.   13. Glaucoma: Stable - continue Timoptic and Xalatan.  14. Toxic metabolic encephalopathy:   Improving, likely due to worsening of CKD as well as gabapentin.  15. Anemia of chronic disease: Continue iron supplement every other day. Recheck CBC in am.   16. Leucocytosis: Monitor temps, incision for healing and for other signs of infection.      Post Admission Physician Evaluation: 1. Functional deficits secondary  to bi-cerebral infarcts, toxic encephalopathy. 2. Patient is admitted to receive collaborative, interdisciplinary care between the physiatrist, rehab nursing staff, and therapy team. 3. Patient's level of medical complexity and substantial therapy needs in context of that medical necessity cannot be provided at a lesser intensity of care such as a SNF. 4. Patient has experienced substantial functional loss from his/her baseline which was documented above under the "Functional History" and "Functional Status" headings.  Judging by the patient's diagnosis, physical exam, and functional history, the patient has potential for functional progress which will result in measurable gains while on inpatient rehab.  These gains will be of substantial and practical use upon discharge  in facilitating mobility and self-care at the household level. 5. Physiatrist will provide 24 hour management of medical needs as well as oversight of the therapy plan/treatment and provide guidance as appropriate regarding the interaction of the two. 6. The Preadmission Screening has been reviewed and patient status is unchanged unless otherwise stated above. 7. 24 hour rehab nursing will assist with bladder management, bowel management, safety, skin/wound care, disease management, medication administration, pain management and patient education  and help integrate therapy concepts, techniques,education, etc. 8. PT will assess and treat for/with: Lower extremity strength, range of motion, stamina, balance, functional mobility, safety, adaptive techniques and equipment, NMR, family ed.   Goals are: mod I. 9. OT will  assess and treat for/with: ADL's, functional mobility, safety, upper extremity strength, adaptive techniques and equipment, NMR, family ed.   Goals are: mod I. Therapy may proceed with showering this patient. 10. SLP will assess and treat for/with: cognition, communication.  Goals are: mod I. 11. Case Management and Social Worker will assess and treat for psychological issues and discharge planning. 12. Team conference will be held weekly to assess progress toward goals and to determine barriers to discharge. 13. Patient will receive at least 3 hours of therapy per day at least 5 days per week. 14. ELOS: 10-14 days       15. Prognosis:  excellent     I have personally performed a face to face diagnostic evaluation of this patient and formulated the key components of the plan.  Additionally, I have personally reviewed laboratory data, imaging studies, as well as relevant notes and concur with the physician assistant's documentation above.   Meredith Staggers, MD, Mellody Drown     Bary Leriche, PA-C 01/16/2019  The patient's status has not changed. The original post admission physician evaluation remains appropriate, and any changes from the pre-admission screening or documentation from the acute chart are noted above.  Meredith Staggers, MD 01/16/2019

## 2019-01-16 NOTE — Progress Notes (Addendum)
[place] VALVE TEAM  Patient Name: [name] [name] Date of Encounter: 01/16/2019  Primary Cardiologist: Dr. Bronson Ing / Dr. Angelena Form & Dr. Roxy Manns (TAVR)  Hospital Problem List     Principal Problem:   S/P TAVR (transcatheter aortic valve replacement) Active Problems:   Essential hypertension   Hyperlipidemia   Chronic diastolic CHF (congestive heart failure) (HCC)   CKD (chronic kidney disease) stage 3, GFR 30-59 ml/min (HCC)   Severe aortic stenosis   Rheumatoid arthritis ([place])   Hypothyroidism   Diabetes mellitus, type 2 ([place])   Embolic stroke ([place])   AKI (acute kidney injury) ([place])   Drug toxicity   Intracranial carotid stenosis     Subjective   No complaints. Up sitting in chair. Feels back to her baseline   Inpatient Medications    Scheduled Meds: .  stroke: mapping our early stages of recovery book   Does not apply Once  . amLODipine  10 mg Oral QHS  . aspirin EC  81 mg Oral Daily  . carvedilol  6.25 mg Oral BID WC  . clopidogrel  75 mg Oral Q breakfast  . ferrous sulfate  325 mg Oral QODAY  . fluticasone  1 spray Each Nare Daily  . furosemide  20 mg Oral Daily  . gabapentin  200 mg Oral BID  . glipiZIDE  10 mg Oral QAC breakfast  . insulin aspart  0-24 Units Subcutaneous TID AC & HS  . latanoprost  1 drop Both Eyes QHS  . levothyroxine  88 mcg Oral QAC breakfast  . metFORMIN  500 mg Oral BID WC  . simvastatin  40 mg Oral QHS  . sodium chloride flush  3 mL Intravenous Q12H  . timolol  1 drop Both Eyes BID   Continuous Infusions: . sodium chloride    . nitroGLYCERIN    . phenylephrine (NEO-SYNEPHRINE) Adult infusion     PRN Meds: sodium chloride, acetaminophen **OR** acetaminophen, albuterol, ALPRAZolam, metoprolol tartrate, morphine injection, ondansetron (ZOFRAN) IV, oxyCODONE, sodium chloride flush, traMADol   Vital Signs    Vitals:   01/16/19 0622 01/16/19 0811 01/16/19 0828 01/16/19 1102  BP:   129/61 (!) 141/53 (!) 104/38  Pulse: 61 68 71 61  Resp: 11 16  10   Temp:  97.7 F (36.5 C)  97.6 F (36.4 C)  TempSrc:  Oral  Oral  SpO2: 100% 100%    Weight: 64.5 kg     Height:        Intake/Output Summary (Last 24 hours) at 01/16/2019 1207 Last data filed at 01/16/2019 1761 Gross per 24 hour  Intake 500 ml  Output -  Net 500 ml   Filed Weights   01/14/19 0437 01/15/19 0413 01/16/19 0622  Weight: 64.9 kg 63.4 kg 64.5 kg    Physical Exam    GEN: chronically ill and disheveled appreaing  HEENT: Grossly normal.  Neck: Supple, no JVD, carotid bruits, or masses. Cardiac: RRR, no murmurs, rubs, or gallops. No clubbing, cyanosis, edema.  Radials/DP/PT 2+ and equal bilaterally.  Respiratory:  Respirations regular and unlabored, clear to auscultation bilaterally. GI: Soft, nontender, nondistended, BS + x 4. MS: no deformity or atrophy. Skin: warm and dry, no rash. Groin sites are stable Neuro:  Strength and sensation are intact. Psych: AAOx3.  Normal affect.  Labs    CBC Recent Labs    01/14/19 0415 01/15/19 0254  WBC 10.9* 10.6*  HGB 10.6* 10.5*  HCT 32.6* 31.7*  MCV  86.9 87.6  PLT 182 008   Basic Metabolic Panel Recent Labs    01/14/19 0415 01/15/19 0254  NA 139 140  K 3.9 3.6  CL 100 102  CO2 26 25  GLUCOSE 194* 157*  BUN 19 18  CREATININE 1.37* 1.23*  CALCIUM 8.7* 8.8*   Liver Function Tests No results for input(s): AST, ALT, ALKPHOS, BILITOT, PROT, ALBUMIN in the last 72 hours. No results for input(s): LIPASE, AMYLASE in the last 72 hours. Cardiac Enzymes No results for input(s): CKTOTAL, CKMB, CKMBINDEX, TROPONINI in the last 72 hours. BNP Invalid input(s): POCBNP D-Dimer No results for input(s): DDIMER in the last 72 hours. Hemoglobin A1C Recent Labs    01/14/19 0415  HGBA1C 9.5*   Fasting Lipid Panel Recent Labs    01/14/19 0415  CHOL 163  HDL 47  LDLCALC 89  TRIG 133  CHOLHDL 3.5   Thyroid Function Tests No results for input(s):  TSH, T4TOTAL, T3FREE, THYROIDAB in the last 72 hours.  Invalid input(s): FREET3  Telemetry    sinus - Personally Reviewed  ECG    Sinus IRBBB (old) - Personally Reviewed  Radiology    Dg Chest 2 View  Result Date: 01/15/2019 CLINICAL DATA:  Wheezing today. EXAM: CHEST - 2 VIEW COMPARISON:  PA and lateral chest 01/09/2019 and 09/19/2018. CT chest 04/17/2018. FINDINGS: There is cardiomegaly without edema. The patient is status post aortic valve replacement. Lungs are clear. No pneumothorax or pleural fluid. Aortic atherosclerosis noted. No acute or focal bony abnormality. IMPRESSION: No acute disease. Cardiomegaly. Atherosclerosis. Electronically Signed   By: Inge Rise M.D.   On: 01/15/2019 16:42    Cardiac Studies   TAVR OPERATIVE NOTE   Date of Procedure:                01/10/2019  Preoperative Diagnosis:      Severe Aortic Stenosis   Postoperative Diagnosis:    Same   Procedure:        Transcatheter Aortic Valve Replacement - Percutaneous Right Transfemoral Approach             Edwards Sapien 3 THV (size 23 mm, model # 9600TFX, serial # [id number])              Co-Surgeons:                        Lauree Chandler, MD and Valentina Gu. Roxy Manns, MD  Anesthesiologist:                  Renold Don, MD  Echocardiographer:              Sanda Klein, MD  Pre-operative Echo Findings: ? Severe aortic stenosis ? Normal left ventricular systolic function  Post-operative Echo Findings: ? Mild paravalvular leak ? Normal left ventricular systolic function ______________  MRI Head 01/13/19  IMPRESSION: At least 10 small areas of acute infarct in both cerebral hemispheres most compatible with cerebral emboli.  Atrophy and mild chronic microvascular ischemia. Chronic microhemorrhage in the pons likely due to hypertension.  ________________   CTA neck 01/13/19  IMPRESSION: CTA NECK: 1. No hemodynamically significant stenosis ICA's. Patent  vertebral arteries. CTA HEAD: 1. No emergent large vessel occlusion. 2. Advanced intracranial atherosclerosis with moderate to severe RIGHT and moderate LEFT ICA stenosis.  Patient Profile     [name] [name] is a 73 y.o. female with a history of HTN, HLD, uncontrolled hypothyroidism, DMT2, chronic bronchitis, CKD stage III,  RA and severe aortic stenosis who presented to [facility] on 01/10/2019 for planned TAVR.  Assessment & Plan   Severe AS:s/p successful TAVR with a62mm Edwards Sapien 3 THV via the TF approach on 01/10/19. Post operative echoshowed EF 60% with normally functioning TAVR with mean gradient 11 mmHg and mild PVL. Groin sites are stable. ECG withsinus with IRBBB (which is old) butno high grade heart block. Continue Asprin andplavix.Plan for DC to inpatient rehab if she gets insurance approval, otherwise home tomorrow as she is refusing SNF  HTN: BP has been labile. Now well controlled on amlodipine, lasix and coreg   DMT2: she was treated with SSI while admitted. Home oral antihyperglycemics have also been resumed given persistent hyperglycemia.   Hypothyroidism: continue synthroid  AKI with baseline CKD stage III: creat up to2.08. Review of labs show labile kidney function that ranges between 1.18-2.44. Creat down to 1.23 today  Somnolence and difficulty walking: after TAVR she was somnolent and had trouble walking due to her "knees buckling." She also had asterixis. After further investigation she reported having difficulty holding things at home and gait and balance issues prior to admission. Due to worsening of these issues neurology was consulted. She was felt to have gabapentin toxicity in the setting of AKI. Also, MRI showed at least 10 small areas of acute infarct in both cerebral hemispheres most compatible with cerebral emboli.  Gabapentin toxicity: her home dose was listed at 600mg  TID in the computer and this was reordered after her surgery. Pharmacy  voiced some concerns during admission with AKI, but with labile renal function and this being listed as her chronic dosage it was decided to leave it and address as an outpatient. Neuro recommended stopping the medication, but the patient was reluctant so it was reduced to 200mg  BID.   Embolic CVA: per Dr. Sallyanne Kuster, "multipletiny embolic lesions are seen on brain MRI. None of these appear to correlate with any obvious clinical deficit,but it is possible that they are worsening her pre-existing mobility issues. Her gait/balance challenges predate the procedure. Asterixis noted, felt to be possibly due to excessive gabapentin dosage,contributing to her balance and gait difficulties. Multiple tiny embolic lesions not unexpected after TAVR. Anticoagulation is not indicatedat this time. We will need inpatient rehab."  Advanced intracranial atherosclerosis: during stroke work up, she was found to have moderate to severe RIGHT and moderate LEFT ICA stenosis. Vascular follow up with need to be arranged.   Dispo: she will go to inpatient rehab today if insurance approves or possibly home tomorrow as she is back to her baseline. She has refused SNF  Signed, Angelena Form, PA-C  01/16/2019, 12:07 PM  Pager [phone]  I have personally seen and examined this patient. I agree with the assessment and plan as outlined above.  She is doing well from a cardiac perspective. As above, she had asterixis post TAVR with instability of her lower extremities. Felt to have gabapentin toxicity per Neuro. Also noted to have small infarcts on brain MRI. Unclear if this was associated wit her neurological symptoms. She is much improved today and ambulating. We had considered inpatient rehab but her insurance denied this. Will likely be able to discharge home tomorrow if stable.   Lauree Chandler 01/16/2019 1:28 PM

## 2019-01-16 NOTE — Progress Notes (Signed)
Physical Therapy Treatment Patient Details Name: [name] [name] MRN: [id number] DOB: 1946-03-15 Today's Date: 01/16/2019    History of Present Illness Pt is a 73 y.o. F with significant PMH of CHF, DM2, CKD, RA who is now s/p transcatheter aortic valve replacement. 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      PT Comments    Patient seen for activity progression. Patient performed in hall ambulation but continues to require physical assist and chair follow. Poor ability to navigate safely on her on and remains high fall risk. Current POC remains appropriate. Continue to feel patient would benefit from post acute rehabilitation.  OF NOTE: patient with 2 episodes of vomiting during session. Nsg aware.  Follow Up Recommendations  CIR;Supervision/Assistance - 24 hour     Equipment Recommendations  None recommended by PT    Recommendations for Other Services Rehab consult;OT consult     Precautions / Restrictions Precautions Precautions: Fall Restrictions Weight Bearing Restrictions: No    Mobility  Bed Mobility Overal bed mobility: Needs Assistance Bed Mobility: Supine to Sit     Supine to sit: Min guard;HOB elevated Sit to supine: Min guard   General bed mobility comments: Min Guard A for safety  Transfers Overall transfer level: Needs assistance Equipment used: Rolling walker (2 wheeled);None Transfers: Sit to/from Stand Sit to Stand: Min assist         General transfer comment: Min assist to power up to standing with use of RW  Ambulation/Gait Ambulation/Gait assistance: Min assist   Assistive device: Rolling walker (2 wheeled);None Gait Pattern/deviations: Step-through pattern;Decreased stride length;Drifts right/left Gait velocity: decreased Gait velocity interpretation: <1.8 ft/sec, indicate of risk for recurrent falls General Gait Details: patient with poor ability to navigate safely. Noted bilateral LE  buckling with ambulation, 2 seated rest breaks required due to decreased activity tolerance. Ambulated on 3 liters supplemental O2 with saturations to mid 80s and increased WOB with audible wheezing noted   Stairs             Wheelchair Mobility    Modified Rankin (Stroke Patients Only)       Balance Overall balance assessment: Needs assistance Sitting-balance support: Feet supported Sitting balance-Leahy Scale: Fair     Standing balance support: Bilateral upper extremity supported Standing balance-Leahy Scale: Poor Standing balance comment: reliance on UE support during functional task performance                            Cognition Arousal/Alertness: Awake/alert Behavior During Therapy: Flat affect Overall Cognitive Status: Impaired/Different from baseline Area of Impairment: Safety/judgement;Problem solving;Awareness;Following commands;Attention                   Current Attention Level: Selective   Following Commands: Follows one step commands with increased time Safety/Judgement: Decreased awareness of deficits Awareness: Emergent Problem Solving: Slow processing;Requires verbal cues General Comments: continues to show decreased safety awarenss and impuslivity      Exercises      General Comments General comments (skin integrity, edema, etc.): vomitted x2 during session nsg aware      Pertinent Vitals/Pain Pain Assessment: No/denies pain    Home Living                      Prior Function            PT Goals (current goals can now be found in the care plan section) Acute Rehab  PT Goals Patient Stated Goal: "I was excited to go home." PT Goal Formulation: With patient Time For Goal Achievement: 01/26/19 Potential to Achieve Goals: Good Progress towards PT goals: Progressing toward goals    Frequency    Min 4X/week      PT Plan Current plan remains appropriate    Co-evaluation              AM-PAC PT  "6 Clicks" Mobility   Outcome Measure  Help needed turning from your back to your side while in a flat bed without using bedrails?: None Help needed moving from lying on your back to sitting on the side of a flat bed without using bedrails?: A Little Help needed moving to and from a bed to a chair (including a wheelchair)?: A Little Help needed standing up from a chair using your arms (e.g., wheelchair or bedside chair)?: A Little Help needed to walk in hospital room?: A Little Help needed climbing 3-5 steps with a railing? : A Lot 6 Click Score: 18    End of Session Equipment Utilized During Treatment: Gait belt Activity Tolerance: Patient tolerated treatment well Patient left: in chair;with call bell/phone within reach;with chair alarm set Nurse Communication: Mobility status PT Visit Diagnosis: Unsteadiness on feet (R26.81);History of falling (Z91.81);Difficulty in walking, not elsewhere classified (R26.2)     Time: 7218-2883 PT Time Calculation (min) (ACUTE ONLY): 16 min  Charges:  $Gait Training: 8-22 mins                     Alben Deeds, PT DPT  Board Certified Neurologic Specialist [place] Pager [phone] Office [place] 01/16/2019, 9:40 AM

## 2019-01-17 ENCOUNTER — Inpatient Hospital Stay (HOSPITAL_COMMUNITY): Payer: Self-pay | Admitting: Physical Therapy

## 2019-01-17 ENCOUNTER — Inpatient Hospital Stay (HOSPITAL_COMMUNITY): Payer: Self-pay

## 2019-01-17 ENCOUNTER — Inpatient Hospital Stay (HOSPITAL_COMMUNITY): Payer: Self-pay | Admitting: Occupational Therapy

## 2019-01-17 DIAGNOSIS — G92 Toxic encephalopathy: Secondary | ICD-10-CM

## 2019-01-17 DIAGNOSIS — N289 Disorder of kidney and ureter, unspecified: Secondary | ICD-10-CM

## 2019-01-17 DIAGNOSIS — D638 Anemia in other chronic diseases classified elsewhere: Secondary | ICD-10-CM

## 2019-01-17 DIAGNOSIS — N189 Chronic kidney disease, unspecified: Secondary | ICD-10-CM

## 2019-01-17 LAB — CBC WITH DIFFERENTIAL/PLATELET
Abs Immature Granulocytes: 0.03 10*3/uL (ref 0.00–0.07)
Basophils Absolute: 0.1 10*3/uL (ref 0.0–0.1)
Basophils Relative: 1 %
EOS ABS: 0.5 10*3/uL (ref 0.0–0.5)
Eosinophils Relative: 5 %
HCT: 26.7 % — ABNORMAL LOW (ref 36.0–46.0)
Hemoglobin: 8.7 g/dL — ABNORMAL LOW (ref 12.0–15.0)
Immature Granulocytes: 0 %
Lymphocytes Relative: 23 %
Lymphs Abs: 2.3 10*3/uL (ref 0.7–4.0)
MCH: 29.1 pg (ref 26.0–34.0)
MCHC: 32.6 g/dL (ref 30.0–36.0)
MCV: 89.3 fL (ref 80.0–100.0)
Monocytes Absolute: 1.1 10*3/uL — ABNORMAL HIGH (ref 0.1–1.0)
Monocytes Relative: 11 %
Neutro Abs: 6.2 10*3/uL (ref 1.7–7.7)
Neutrophils Relative %: 60 %
Platelets: 158 10*3/uL (ref 150–400)
RBC: 2.99 MIL/uL — ABNORMAL LOW (ref 3.87–5.11)
RDW: 15.1 % (ref 11.5–15.5)
WBC: 10.1 10*3/uL (ref 4.0–10.5)
nRBC: 0 % (ref 0.0–0.2)

## 2019-01-17 LAB — GLUCOSE, CAPILLARY
GLUCOSE-CAPILLARY: 89 mg/dL (ref 70–99)
Glucose-Capillary: 241 mg/dL — ABNORMAL HIGH (ref 70–99)
Glucose-Capillary: 76 mg/dL (ref 70–99)
Glucose-Capillary: 113 mg/dL — ABNORMAL HIGH (ref 70–99)
Glucose-Capillary: 122 mg/dL — ABNORMAL HIGH (ref 70–99)
Glucose-Capillary: 192 mg/dL — ABNORMAL HIGH (ref 70–99)

## 2019-01-17 LAB — COMPREHENSIVE METABOLIC PANEL
ALT: 11 U/L (ref 0–44)
AST: 13 U/L — ABNORMAL LOW (ref 15–41)
Albumin: 2.7 g/dL — ABNORMAL LOW (ref 3.5–5.0)
Alkaline Phosphatase: 56 U/L (ref 38–126)
Anion gap: 14 (ref 5–15)
BUN: 53 mg/dL — ABNORMAL HIGH (ref 8–23)
CO2: 26 mmol/L (ref 22–32)
Calcium: 8.4 mg/dL — ABNORMAL LOW (ref 8.9–10.3)
Chloride: 96 mmol/L — ABNORMAL LOW (ref 98–111)
Creatinine, Ser: 3.75 mg/dL — ABNORMAL HIGH (ref 0.44–1.00)
GFR calc Af Amer: 13 mL/min — ABNORMAL LOW (ref >60)
GFR calc non Af Amer: 11 mL/min — ABNORMAL LOW (ref >60)
Glucose, Bld: 86 mg/dL (ref 70–99)
Potassium: 4.8 mmol/L (ref 3.5–5.1)
SODIUM: 136 mmol/L (ref 135–145)
Total Bilirubin: 0.3 mg/dL (ref 0.3–1.2)
Total Protein: 5.8 g/dL — ABNORMAL LOW (ref 6.5–8.1)

## 2019-01-17 NOTE — Evaluation (Signed)
Speech Language Pathology Assessment and Plan  Patient Details  Name: [name] [name] MRN: [id number] Date of Birth: 03-25-1946  SLP Diagnosis: Cognitive Impairments  Rehab Potential: Good ELOS: 2/12    Today's Date: 01/17/2019 SLP Individual Time: 0900-1000 SLP Individual Time Calculation (min): 60 min   Problem List:  Patient Active Problem List   Diagnosis Date Noted  . Embolic stroke ([place]) 38/18/2993  . AKI (acute kidney injury) ([place]) 01/15/2019  . Drug toxicity 01/15/2019  . Intracranial carotid stenosis 01/15/2019  . 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 Severe aortic stenosis   . Rheumatoid arthritis ([place])   . Hypothyroidism   . Diabetes mellitus, type 2 ([place])   . Chronic diastolic CHF (congestive heart failure) ([place]) 08/18/2018  . CKD (chronic kidney disease) stage 3, GFR 30-59 ml/min (HCC) 08/18/2018  . Frequent falls 02/02/2018  . History of gastric ulcer 10/16/2016  . Dysphagia 10/16/2016  . Spondylolisthesis of lumbar region 08/03/2014  . Essential hypertension 08/02/2014  . Hyperlipidemia 08/02/2014  . Primary osteoarthritis of both knees 06/14/2014  . Right leg weakness 10/31/2013  . H/O adenomatous polyp of colon 08/17/2012  . Family history of colon cancer 08/17/2012  . Chronic nausea 08/17/2012   Past Medical History:  Past Medical History:  Diagnosis Date  . Anemia   . Anxiety   . Arthritis   . Blood transfusion without reported diagnosis   . Bronchitis   . CHF (congestive heart failure) ([name])   . CKD (chronic kidney disease) stage 3, GFR 30-59 ml/min (HCC) 08/18/2018  . Depression   . Diabetes mellitus   . Diverticulitis   . Glaucoma   . Hyperlipidemia   . Hypertension   . Hypothyroidism   . Macular degeneration   . Melanoma ([place])    left thigh, 2010  . Osteoarthritis   . Peptic ulcer   . Rheumatoid arthritis(714.0)   . S/P TAVR (transcatheter aortic valve replacement) 01/10/2019   23 mm Edwards Sapien 3  transcatheter heart valve placed via percutaneous right transfemoral approach   . Severe aortic stenosis    Past Surgical History:  Past Surgical History:  Procedure Laterality Date  . ABDOMINAL HYSTERECTOMY  1988  . APPENDECTOMY    . BACK SURGERY  07/2008   fusion  . BACK SURGERY  06/1695   complicated by post-op anemia requiring transfusion  . BREAST SURGERY     right breast biopsy  . CARDIAC CATHETERIZATION    . CATARACT EXTRACTION    . CHOLECYSTECTOMY  1974  . COLONOSCOPY  06/06/2007   Proximal diminutive rectal polyps cold biopsied/removed, otherwise normal rectum/ Left-sided diverticula, the remainder of the colonic mucosa appeared normal. Path-tubular adnemoa.   . COLONOSCOPY  09/14/2012   VEL:FYBOFB polyps-removed as described  above Colonic diverticulosis. Poor prep. next TCS five years.   . COLONOSCOPY N/A 10/29/2016   Procedure: COLONOSCOPY;  Surgeon: Daneil Dolin, MD;  [facility];  Service: Endoscopy;  Laterality: N/A;  2:00 pm  . ESOPHAGOGASTRODUODENOSCOPY  09/2012   Rourk: gastric ulcer, gastric duodenal erosions, small hh. benign bx  . ESOPHAGOGASTRODUODENOSCOPY N/A 10/29/2016   Procedure: ESOPHAGOGASTRODUODENOSCOPY (EGD);  Surgeon: Daneil Dolin, MD;  [facility];  Service: Endoscopy;  Laterality: N/A;  . EYE SURGERY    . FOOT SURGERY  1994   left   . LUMBAR SPINE SURGERY  08/04/2014   dr Joya Salm  . macular degen    . MALONEY DILATION N/A 10/29/2016   Procedure: Venia Minks  DILATION;  Surgeon: Daneil Dolin, MD;  [facility];  Service: Endoscopy;  Laterality: N/A;  . MELANOMA EXCISION  2010  . MULTIPLE EXTRACTIONS WITH ALVEOLOPLASTY N/A 08/17/2018   Procedure: Extraction of tooth #'s 7,8, and 9 with alveoloplasty and gross debridement of remaining teeth;  Surgeon: Lenn Cal, DDS;  Location: [place];  Service: Oral Surgery;  Laterality: N/A;  . RIGHT/LEFT HEART CATH AND CORONARY ANGIOGRAPHY N/A 08/11/2018   Procedure:  RIGHT/LEFT HEART CATH AND CORONARY ANGIOGRAPHY;  Surgeon: Burnell Blanks, MD;  Location: [place] [facility];  Service: Cardiovascular;  Laterality: N/A;  . TEE WITHOUT CARDIOVERSION N/A 01/10/2019   Procedure: TRANSESOPHAGEAL ECHOCARDIOGRAM (TEE);  Surgeon: Burnell Blanks, MD;  Location: [place] [facility];  Service: Open Heart Surgery;  Laterality: N/A;  . TRANSCATHETER AORTIC VALVE REPLACEMENT, TRANSFEMORAL N/A 01/10/2019   Procedure: TRANSCATHETER AORTIC VALVE REPLACEMENT, TRANSFEMORAL;  Surgeon: Burnell Blanks, MD;  Location: [place] [facility];  Service: Open Heart Surgery;  Laterality: N/A;  . TRANSCATHETER AORTIC VALVE REPLACEMENT, TRANSFEMORAL  12/2018  . YAG LASER APPLICATION     OS    Assessment / Plan / Recommendation Clinical Impression [name] [name] is a 73 year old female with history ofchronic diastolic CHF, COPD, K8MN, chronic LBP,gait disorder with instability, moderate to severe leftASwho was admitted on 01/10/2023 TVAR by Dr. Caryl Bis. Postop noted to have issues with bilateral lower extremity weakness with myoclonic jerks, BUE dysmetria andinability to walk. Also noted to have lethargy with asterixis therefore neurology consulted for input. Dr. Leonel Ramsay consulted andfelt that patient withgabapentin toxicity due to increase in dose in setting ofacute on chronic renal failurewhich was likely contributing to the symptomsbut recommended neurological work up.MRI brain revealing multiple small areas of acute infarct in bilateral cerebral hemispheres c/w cardioembolism.CTA head/neck was negative for emergent large vessel occlusion and showed moderate to server R-ICA and moderate L-ICA stenosis.  Neurology felt that strokes were related to [facility] --no further work up indicated and recommended following up with [facility] on outpatient basis. Asterixis and lethargy has resolved. She continue to have weakness with poor safety awareness and impulsivity,  mild cognitive impairments,that is affecting mobility and ADL tasks. CIR recommended due to functional decline.  Pt presents with moderate to mild cognitive linguistic impairments, deficits include mildly complex problem solving, intellectual/emgernt awareness (spilnter skills), sustained attention, working and short term memory, largely due to delayed processing, which was further supported by formal cognitive linguistic assessment utilizing Cognistat.  Pt's performances was impacted by increase lethargy, due to reduced metabolization of medication, HOH on right side and nearsighted glasses not present. Pt demonstrated ability to read and write at functional level. SLP suspects pt functioned at a basic to mildly complex level piror to admission, family completed higher level cognitive tasks, such as Civil engineer, contracting, IADLs and driving. Pt presents no speech impairments and no noted dysphagia for chart review. Pt would benefit from skilled ST services in order to maximize functional independence and reduce burden of care, likely requiring supervision at discharge with continued skilled ST services.   Skilled Therapeutic Interventions          Skilled ST services focused on cognitive skills. SLP facilitated administration of cognitive linguistic formal assessment and provided education of results. SLP and pt colaborated to set goals for cognitive linguistic needs during length of stay. All questions answered to satisfaction.  Pt was left in room with call bell within reach and bed alarm set. SLP reccomends to continue  skilled services.   SLP Assessment  Patient will need skilled Speech Lanaguage Pathology Services during CIR admission    Recommendations  SLP Diet Recommendations: Thin Liquid Administration via: Straw;Cup Medication Administration: Whole meds with liquid Supervision: Patient able to self feed Postural Changes and/or Swallow Maneuvers: Seated upright 90 degrees Oral Care  Recommendations: Oral care BID Patient destination: Home Follow up Recommendations: Home Health SLP;24 hour supervision/assistance Equipment Recommended: None recommended by SLP    SLP Frequency 3 to 5 out of 7 days   SLP Duration  SLP Intensity  SLP Treatment/Interventions 2/12  Minumum of 1-2 x/day, 30 to 90 minutes  Cognitive remediation/compensation;Cueing hierarchy;Functional tasks;Internal/external aids    Pain Pain Assessment Pain Scale: 0-10 Pain Score: 0-No pain  Prior Functioning Cognitive/Linguistic Baseline: Information not available Baseline deficit details: Pt reported baseline deficits on acute of memory/WF, inpatient eval denied piror deficits, however believe functioned at basic level due to limited listed reponseibilities at home Type of Home: House  Lives With: Family(2 daughters, 2 granddaughters and grand son-in-law) Available Help at Discharge: Family;Available 24 hours/day Vocation: Retired  Industrial/product designer Term Goals: Week 1: SLP Short Term Goal 1 (Week 1): STG=LTG due to short ELOS  Refer to Care Plan for Long Term Goals  Recommendations for other services: None   Discharge Criteria: Patient will be discharged from SLP if patient refuses treatment 3 consecutive times without medical reason, if treatment goals not met, if there is a change in medical status, if patient makes no progress towards goals or if patient is discharged from hospital.  The above assessment, treatment plan, treatment alternatives and goals were discussed and mutually agreed upon: by patient  Shontae Rosiles  [facility] 01/17/2019, 2:37 PM

## 2019-01-17 NOTE — Evaluation (Signed)
Occupational Therapy Assessment and Plan  Patient Details  Name: [name] [name] MRN: [id number] Date of Birth: [birth year]  OT Diagnosis: abnormal posture, cognitive deficits, muscle weakness (generalized) and coordination disorder Rehab Potential: Rehab Potential (ACUTE ONLY): Fair ELOS: 7-10 days   Today's Date: 01/17/2019 OT Individual Time: 1045-1140 OT Individual Time Calculation (min): 55 min  and Today's Date: 01/17/2019 OT Missed Time: 20 Minutes Missed Time Reason: Patient fatigue    Problem List:  Patient Active Problem List   Diagnosis Date Noted  . Embolic stroke ([place]) 51/01/5851  . AKI (acute kidney injury) ([place]) 01/15/2019  . Drug toxicity 01/15/2019  . Intracranial carotid stenosis 01/15/2019  . 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 Severe aortic stenosis   . Rheumatoid arthritis ([employer])   . Hypothyroidism   . Diabetes mellitus, type 2 ([place])   . Chronic diastolic CHF (congestive heart failure) ([place]) 08/18/2018  . CKD (chronic kidney disease) stage 3, GFR 30-59 ml/min (HCC) 08/18/2018  . Frequent falls 02/02/2018  . History of gastric ulcer 10/16/2016  . Dysphagia 10/16/2016  . Spondylolisthesis of lumbar region 08/03/2014  . Essential hypertension 08/02/2014  . Hyperlipidemia 08/02/2014  . Primary osteoarthritis of both knees 06/14/2014  . Right leg weakness 10/31/2013  . H/O adenomatous polyp of colon 08/17/2012  . Family history of colon cancer 08/17/2012  . Chronic nausea 08/17/2012    Past Medical History:  Past Medical History:  Diagnosis Date  . Anemia   . Anxiety   . Arthritis   . Blood transfusion without reported diagnosis   . Bronchitis   . CHF (congestive heart failure) ([place])   . CKD (chronic kidney disease) stage 3, GFR 30-59 ml/min (HCC) 08/18/2018  . Depression   . Diabetes mellitus   . Diverticulitis   . Glaucoma   . Hyperlipidemia   . Hypertension   . Hypothyroidism   . Macular degeneration   . Melanoma  ([place])    left thigh, 2010  . Osteoarthritis   . Peptic ulcer   . Rheumatoid arthritis(714.0)   . 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 Severe aortic stenosis    Past Surgical History:  Past Surgical History:  Procedure Laterality Date  . ABDOMINAL HYSTERECTOMY  1988  . APPENDECTOMY    . BACK SURGERY  07/2008   fusion  . BACK SURGERY  06/7823   complicated by post-op anemia requiring transfusion  . BREAST SURGERY     right breast biopsy  . CARDIAC CATHETERIZATION    . CATARACT EXTRACTION    . CHOLECYSTECTOMY  1974  . COLONOSCOPY  06/06/2007   Proximal diminutive rectal polyps cold biopsied/removed, otherwise normal rectum/ Left-sided diverticula, the remainder of the colonic mucosa appeared normal. Path-tubular adnemoa.   . COLONOSCOPY  09/14/2012   MPN:TIRWER polyps-removed as described  above Colonic diverticulosis. Poor prep. next TCS five years.   . COLONOSCOPY N/A 10/29/2016   Procedure: COLONOSCOPY;  Surgeon: Daneil Dolin, MD;  [facility];  Service: Endoscopy;  Laterality: N/A;  2:00 pm  . ESOPHAGOGASTRODUODENOSCOPY  09/2012   Rourk: gastric ulcer, gastric duodenal erosions, small hh. benign bx  . ESOPHAGOGASTRODUODENOSCOPY N/A 10/29/2016   Procedure: ESOPHAGOGASTRODUODENOSCOPY (EGD);  Surgeon: Daneil Dolin, MD;  [facility];  Service: Endoscopy;  Laterality: N/A;  . EYE SURGERY    . FOOT SURGERY  1994   left   .  LUMBAR SPINE SURGERY  08/04/2014   dr Joya Salm  . macular degen    . MALONEY DILATION N/A 10/29/2016   Procedure: Venia Minks DILATION;  Surgeon: Daneil Dolin, MD;  [facility];  Service: Endoscopy;  Laterality: N/A;  . MELANOMA EXCISION  2010  . MULTIPLE EXTRACTIONS WITH ALVEOLOPLASTY N/A 08/17/2018   Procedure: Extraction of tooth #'s 7,8, and 9 with alveoloplasty and gross debridement of remaining  teeth;  Surgeon: Lenn Cal, DDS;  Location: [facility];  Service: Oral Surgery;  Laterality: N/A;  . RIGHT/LEFT HEART CATH AND CORONARY ANGIOGRAPHY N/A 08/11/2018   Procedure: RIGHT/LEFT HEART CATH AND CORONARY ANGIOGRAPHY;  Surgeon: Burnell Blanks, MD;  Location: [facility];  Service: Cardiovascular;  Laterality: N/A;  . TEE WITHOUT CARDIOVERSION N/A 01/10/2019   Procedure: TRANSESOPHAGEAL ECHOCARDIOGRAM (TEE);  Surgeon: Burnell Blanks, MD;  Location: [facility];  Service: Open Heart Surgery;  Laterality: N/A;  . TRANSCATHETER AORTIC VALVE REPLACEMENT, TRANSFEMORAL N/A 01/10/2019   Procedure: TRANSCATHETER AORTIC VALVE REPLACEMENT, TRANSFEMORAL;  Surgeon: Burnell Blanks, MD;  Location: [facility];  Service: Open Heart Surgery;  Laterality: N/A;  . TRANSCATHETER AORTIC VALVE REPLACEMENT, TRANSFEMORAL  12/2018  . YAG LASER APPLICATION     OS    Assessment & Plan Clinical Impression: Patient is a 73 y.o. year old female with history ofchronic diastolic CHF, COPD, P8EU, chronic LBP,gait disorder with instability, moderate to severe leftASwho was admitted on 01/10/2019 TVAR by Dr. Caryl Bis. Postop noted to have issues with bilateral lower extremity weakness with myoclonic jerks, BUE dysmetria andinability to walk. Also noted to have lethargy with asterixis therefore neurology consulted for input. Dr. Leonel Ramsay consulted andfelt that patient withgabapentin toxicity due to increase in dose in setting ofacute on chronic renal failurewhich was likely contributing to the symptomsbut recommended neurological work up.MRI brain revealing multiple small areas of acute infarct in bilateral cerebral hemispheres c/w cardioembolism.CTA head/neck was negative for emergent large vessel occlusion and showed moderate to server R-ICA and moderate L-ICA stenosis.  .  Patient transferred to CIR on 01/16/2019 .    Patient currently requires min with basic  self-care skills and IADL secondary to muscle weakness, decreased cardiorespiratoy endurance and decreased oxygen support, decreased coordination and decreased motor planning, decreased attention, decreased awareness, decreased problem solving, decreased safety awareness and decreased memory and decreased sitting balance, decreased standing balance and decreased balance strategies.  Prior to hospitalization, patient could complete ADLs with independent .  Patient will benefit from skilled intervention to increase independence with basic self-care skills prior to discharge home with care partner.  Anticipate patient will require 24 hour supervision and follow up home health.  OT - End of Session Activity Tolerance: Decreased this session Endurance Deficit: Yes Endurance Deficit Description: frequent rest breaks, keeps closing eyes during session OT Assessment Rehab Potential (ACUTE ONLY): Fair OT Barriers to Discharge: New oxygen OT Patient demonstrates impairments in the following area(s): Balance;Cognition;Endurance;Motor;Safety;Perception OT Basic ADL's Functional Problem(s): Grooming;Bathing;Dressing;Toileting OT Transfers Functional Problem(s): Toilet;Tub/Shower OT Additional Impairment(s): None OT Plan OT Intensity: Minimum of 1-2 x/day, 45 to 90 minutes OT Frequency: 5 out of 7 days OT Duration/Estimated Length of Stay: 7-10 days OT Treatment/Interventions: Balance/vestibular training;Disease mangement/prevention;Neuromuscular re-education;Self Care/advanced ADL retraining;Therapeutic Exercise;Cognitive remediation/compensation;DME/adaptive equipment instruction;Pain management;Skin care/wound managment;UE/LE Strength taining/ROM;Community reintegration;Patient/family education;UE/LE Coordination activities;Discharge planning;Functional mobility training;Psychosocial support;Therapeutic Activities OT Self Feeding Anticipated Outcome(s): n/a OT Basic Self-Care Anticipated Outcome(s):  Supervision OT Toileting Anticipated Outcome(s): Supervision OT Bathroom Transfers Anticipated Outcome(s): Supervision  OT Recommendation Recommendations for Other Services: Other (comment)(none at this time) Patient destination: Home Follow Up Recommendations: 24 hour supervision/assistance Equipment Recommended: To be determined   Skilled Therapeutic Intervention Upon entering the room, pt supine in bed and sleeping soundly. Pt very lethargic this session and taking over 30 minutes to awaken and have pt exit the bed. Pt on 2 L O2 via [place] but decreased to 1 L and pt keeping O2 saturation at or above 97% this session. Pt declined bathing and dressing this session. OT provided education to pt on OT purpose, POC, and goals with pt verbalizing understanding and agreement. Pt ambulating to sink for grooming tasks with min guard and use of RW. Pt standing for 2-3 minutes at sink when B LEs buckle and pt seated in chair to take rest break. Pt verbalized this happened prior to admission and pt having frequent falls at home. Pt returning to bed at end of session in same manner with plans for shower tomorrow morning to address functional deficits. Pt in bed with bed alarm activated and call bell within reach.   OT Evaluation Precautions/Restrictions  Precautions Precautions: Fall Restrictions Weight Bearing Restrictions: No General OT Amount of Missed Time: 20 Minutes Pain Pain Assessment Pain Scale: 0-10 Pain Score: 0-No pain Home Living/Prior Functioning Home Living Family/patient expects to be discharged to:: Private residence Living Arrangements: Children, Other relatives Available Help at Discharge: Family, Available 24 hours/day Type of Home: House Home Access: Stairs to enter [employer] of Steps: 2 Entrance Stairs-Rails: None Home Layout: One level Bathroom Shower/Tub: Multimedia programmer: Standard Additional Comments: Pt reports almost daily falls  Lives  With: Family(pt reports children and 3 adults in home with her) Prior Function Level of Independence: Requires assistive device for independence Comments: uses Rollator Vision Baseline Vision/History: Wears glasses;Glaucoma Wears Glasses: At all times Patient Visual Report: No change from baseline Perception  Perception: Within Functional Limits Praxis Praxis: Intact Cognition Overall Cognitive Status: Impaired/Different from baseline Arousal/Alertness: Awake/alert Orientation Level: Person;Place;Situation Person: Oriented Place: Oriented Situation: Oriented Year: 2020 Month: February Day of Week: Correct Memory: Impaired Memory Impairment: Storage deficit;Decreased short term memory Decreased Short Term Memory: Verbal basic;Functional basic Immediate Memory Recall: Sock;Blue;Bed Memory Recall: Sock;Blue;Bed Memory Recall Sock: Without Cue Memory Recall Blue: With Cue Memory Recall Bed: With Cue Attention: Sustained Sustained Attention: Appears intact Selective Attention: Impaired Selective Attention Impairment: Verbal basic;Functional basic Alternating Attention Impairment: Functional basic Awareness: Impaired Awareness Impairment: Other (comment);Intellectual impairment;Emergent impairment Problem Solving: Impaired Problem Solving Impairment: Functional basic;Verbal basic Safety/Judgment: Appears intact Sensation Sensation Light Touch: Appears Intact Proprioception: Appears Intact Coordination Gross Motor Movements are Fluid and Coordinated: No Fine Motor Movements are Fluid and Coordinated: Yes Coordination and Movement Description: impaired by generalized weakness Heel Shin Test: intact B Motor  Motor Motor: Other (comment) Motor - Skilled Clinical Observations: generalized weakness Mobility  Bed Mobility Bed Mobility: Rolling Right;Rolling Left;Supine to Sit;Sit to Supine Rolling Right: Contact Guard/Touching assist Rolling Left: Contact Guard/Touching  assist Supine to Sit: Contact Guard/Touching assist Sit to Supine: Contact Guard/Touching assist Transfers Sit to Stand: Minimal Assistance - Patient > 75% Stand to Sit: Minimal Assistance - Patient > 75%  Trunk/Postural Assessment  Cervical Assessment Cervical Assessment: Within Functional Limits Thoracic Assessment Thoracic Assessment: Exceptions to WFL(rounded shoulders) Lumbar Assessment Lumbar Assessment: Exceptions to WFL(posterior pelvic tilt) Postural Control Postural Control: Within Functional Limits  Balance Balance Balance Assessed: Yes Static Sitting Balance Static Sitting - Balance Support: No upper extremity supported;Feet supported Static Sitting -  Level of Assistance: 5: Stand by assistance Dynamic Sitting Balance Dynamic Sitting - Balance Support: No upper extremity supported;Feet supported;During functional activity Dynamic Sitting - Level of Assistance: 5: Stand by assistance Static Standing Balance Static Standing - Balance Support: Bilateral upper extremity supported;During functional activity Static Standing - Level of Assistance: 4: Min assist Dynamic Standing Balance Dynamic Standing - Balance Support: Bilateral upper extremity supported;During functional activity Dynamic Standing - Level of Assistance: 4: Min assist Extremity/Trunk Assessment RUE Assessment RUE Assessment: Within Functional Limits LUE Assessment LUE Assessment: Within Functional Limits     Refer to Care Plan for Long Term Goals  Recommendations for other services: None    Discharge Criteria: Patient will be discharged from OT if patient refuses treatment 3 consecutive times without medical reason, if treatment goals not met, if there is a change in medical status, if patient makes no progress towards goals or if patient is discharged from hospital.  The above assessment, treatment plan, treatment alternatives and goals were discussed and mutually agreed upon: by  patient  Gypsy Decant 01/17/2019, 12:29 PM

## 2019-01-17 NOTE — Progress Notes (Signed)
Inpatient Rehabilitation  Patient information reviewed and entered into eRehab system by Jeremian Whitby M. Cristin Szatkowski, M.A., CCC/SLP, PPS Coordinator.  Information including medical coding, functional ability and quality indicators will be reviewed and updated through discharge.    Per nursing patient was given "Data Collection Information Summary" for Patients in Inpatient Rehabilitation Facilities with attached "Privacy Act Statement-Health Care Records" upon admission.   

## 2019-01-17 NOTE — Progress Notes (Signed)
[facility] PHYSICAL MEDICINE & REHABILITATION PROGRESS NOTE   Subjective/Complaints: Had a reasonable night. Ready for therapies today. Denies any pain.   ROS: Patient denies fever, rash, sore throat, blurred vision, nausea, vomiting, diarrhea, cough, shortness of breath or chest pain, joint or back pain, headache, or mood change.    Objective:   Dg Chest 2 View  Result Date: 01/15/2019 CLINICAL DATA:  Wheezing today. EXAM: CHEST - 2 VIEW COMPARISON:  PA and lateral chest 01/09/2019 and 09/19/2018. CT chest 04/17/2018. FINDINGS: There is cardiomegaly without edema. The patient is status post aortic valve replacement. Lungs are clear. No pneumothorax or pleural fluid. Aortic atherosclerosis noted. No acute or focal bony abnormality. IMPRESSION: No acute disease. Cardiomegaly. Atherosclerosis. Electronically Signed   By: Inge Rise M.D.   On: 01/15/2019 16:42   Recent Labs    01/15/19 0254 01/17/19 0638  WBC 10.6* 10.1  HGB 10.5* 8.7*  HCT 31.7* 26.7*  PLT 194 158   Recent Labs    01/15/19 0254 01/17/19 0638  NA 140 136  K 3.6 4.8  CL 102 96*  CO2 25 26  GLUCOSE 157* 86  BUN 18 53*  CREATININE 1.23* 3.75*  CALCIUM 8.8* 8.4*    Intake/Output Summary (Last 24 hours) at 01/17/2019 0951 Last data filed at 01/17/2019 0853 Gross per 24 hour  Intake 60 ml  Output 2 ml  Net 58 ml     Physical Exam: Vital Signs Blood pressure 120/84, pulse 70, temperature 98.4 F (36.9 C), temperature source Oral, resp. rate 18, height 4\' 9"  (1.448 m), weight 67 kg, SpO2 100 %. Constitutional: No distress . Vital signs reviewed. HEENT: EOMI, oral membranes moist Neck: supple Cardiovascular: RRR without murmur. No JVD    Respiratory: insp/exp wheezes, O2 [place]. No distress  GI: BS +, non-tender, non-distended    Musculoskeletal:  General: Edema(1+ pitting edema bilateral shins. )present.  Neuro: alert, oriented. Moves all 4's 4- to 4/5, no sensory abnl    Assessment/Plan: 1.  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  Physiatrist is providing close team supervision and 24 hour management of active medical problems listed below.  Physiatrist and rehab team continue to assess barriers to discharge/monitor patient progress toward functional and medical goals  Care Tool:  Bathing              Bathing assist       Upper Body Dressing/Undressing Upper body dressing        Upper body assist      Lower Body Dressing/Undressing Lower body dressing            Lower body assist       Toileting Toileting    Toileting assist Assist for toileting: Minimal Assistance - Patient > 75%     Transfers Chair/bed transfer  Transfers assist           Locomotion Ambulation   Ambulation assist              Walk 10 feet activity   Assist           Walk 50 feet activity   Assist           Walk 150 feet activity   Assist           Walk 10 feet on uneven surface  activity   Assist  Wheelchair     Assist               Wheelchair 50 feet with 2 turns activity    Assist            Wheelchair 150 feet activity     Assist          Medical Problem List and Plan: 1.Functional deficitssecondary to embolic bi-cerebral strokes after TAVR and toxic encephalopathy -beginning therapies today, team conference today 2. DVT Prophylaxis/Anticoagulation: Mechanical:Sequential compression devices, below kneeBilateral lower extremities 3.Chronic LBP/Pain Management:Used hydrocodone and gabapentin at home. 4. Mood:LCSW to follow for evaluation and support. 5. Neuropsych: This patientIs not fullycapable of making decisions on herown behalf. 6. Skin/Wound Care:Routine pressure relief measures 7. Fluids/Electrolytes/Nutrition:BUN and  creatinine up to 53/3.75 today.  -Encourage fluids and medication changes as below. 8. T2DM: Continue to glucotrol and metformin. Will monitor BS ac/hs.  9. COPD:Add flutter valve.Encourage ISand weanoxygen to off.Asking for her inhaler--will add nebs prn for now. 10. Chronic diastolic CHF: Monitor for signs of fluid overload. Check weight daily to monitor for stability. On lasix, ASA, Plavix, coreg--will change Zocor to Lipitor to avoid interaction with amlodipine. 11. Acute on CKD: Push fluids, serial checks.   - Hold Lasix 12. HTN: Monitor BP bid. Norvasc added on 1/31 for better control.    13. Glaucoma: Stable - continue Timoptic and Xalatan.  58.IFOYD metabolic encephalopathy:Improving, likelydue to worsening of CKD as well as gabapentin.  15. Anemia of chronic disease: Continue iron supplement every other day.   -Hemoglobin slightly decreased to 8.7 today.  No signs of obvious, gross blood loss.  Continue to monitor.  Recheck in the morning 6. Leucocytosis: Monitor temps, incision for healing and for other signs of infection.   -WBCs are 10.1 today  LOS: 1 days A FACE TO [place] 01/17/2019, 9:51 AM

## 2019-01-17 NOTE — Progress Notes (Signed)
Physical Therapy Assessment and Plan  Patient Details  Name: ALEKSIS JIGGETTS MRN: [id number] Date of Birth: 26-Mar-1946  PT Diagnosis: Abnormality of gait, Coordination disorder, Difficulty walking, Impaired cognition and Muscle weakness Rehab Potential: Good ELOS: 7-10 days   Today's Date: 01/17/2019 PT Individual Time: 1300-1400 PT Individual Time Calculation (min): 60 min    Problem List:  Patient Active Problem List   Diagnosis Date Noted  . Embolic stroke ([place]) 95/63/8756  . AKI (acute kidney injury) ([place]) 01/15/2019  . Drug toxicity 01/15/2019  . Intracranial carotid stenosis 01/15/2019  . 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 Severe aortic stenosis   . Rheumatoid arthritis ([place])   . Hypothyroidism   . Diabetes mellitus, type 2 ([place])   . Chronic diastolic CHF (congestive heart failure) ([place]) 08/18/2018  . CKD (chronic kidney disease) stage 3, GFR 30-59 ml/min (HCC) 08/18/2018  . Frequent falls 02/02/2018  . History of gastric ulcer 10/16/2016  . Dysphagia 10/16/2016  . Spondylolisthesis of lumbar region 08/03/2014  . Essential hypertension 08/02/2014  . Hyperlipidemia 08/02/2014  . Primary osteoarthritis of both knees 06/14/2014  . Right leg weakness 10/31/2013  . H/O adenomatous polyp of colon 08/17/2012  . Family history of colon cancer 08/17/2012  . Chronic nausea 08/17/2012    Past Medical History:  Past Medical History:  Diagnosis Date  . Anemia   . Anxiety   . Arthritis   . Blood transfusion without reported diagnosis   . Bronchitis   . CHF (congestive heart failure) ([place])   . CKD (chronic kidney disease) stage 3, GFR 30-59 ml/min (HCC) 08/18/2018  . Depression   . Diabetes mellitus   . Diverticulitis   . Glaucoma   . Hyperlipidemia   . Hypertension   . Hypothyroidism   . Macular degeneration   . Melanoma ([place])    left thigh, 2010  . Osteoarthritis   . Peptic ulcer   . Rheumatoid arthritis(714.0)   . S/P TAVR  (transcatheter aortic valve replacement) 01/10/2019   23 mm Edwards Sapien 3 transcatheter heart valve placed via percutaneous right transfemoral approach   . Severe aortic stenosis    Past Surgical History:  Past Surgical History:  Procedure Laterality Date  . ABDOMINAL HYSTERECTOMY  1988  . APPENDECTOMY    . BACK SURGERY  07/2008   fusion  . BACK SURGERY  03/3328   complicated by post-op anemia requiring transfusion  . BREAST SURGERY     right breast biopsy  . CARDIAC CATHETERIZATION    . CATARACT EXTRACTION    . CHOLECYSTECTOMY  1974  . COLONOSCOPY  06/06/2007   Proximal diminutive rectal polyps cold biopsied/removed, otherwise normal rectum/ Left-sided diverticula, the remainder of the colonic mucosa appeared normal. Path-tubular adnemoa.   . COLONOSCOPY  09/14/2012   JJO:ACZYSA polyps-removed as described  above Colonic diverticulosis. Poor prep. next TCS five years.   . COLONOSCOPY N/A 10/29/2016   Procedure: COLONOSCOPY;  Surgeon: Daneil Dolin, MD;  [facility];  Service: Endoscopy;  Laterality: N/A;  2:00 pm  . ESOPHAGOGASTRODUODENOSCOPY  09/2012   Rourk: gastric ulcer, gastric duodenal erosions, small hh. benign bx  . ESOPHAGOGASTRODUODENOSCOPY N/A 10/29/2016   Procedure: ESOPHAGOGASTRODUODENOSCOPY (EGD);  Surgeon: Daneil Dolin, MD;  [facility];  Service: Endoscopy;  Laterality: N/A;  . EYE SURGERY    . FOOT SURGERY  1994   left   . LUMBAR SPINE SURGERY  08/04/2014   dr Joya Salm  . macular degen    .  MALONEY DILATION N/A 10/29/2016   Procedure: Venia Minks DILATION;  Surgeon: Daneil Dolin, MD;  [facility];  Service: Endoscopy;  Laterality: N/A;  . MELANOMA EXCISION  2010  . MULTIPLE EXTRACTIONS WITH ALVEOLOPLASTY N/A 08/17/2018   Procedure: Extraction of tooth #'s 7,8, and 9 with alveoloplasty and gross debridement of remaining teeth;  Surgeon: Lenn Cal, DDS;  Location: [facility];  Service: Oral Surgery;  Laterality: N/A;  .  RIGHT/LEFT HEART CATH AND CORONARY ANGIOGRAPHY N/A 08/11/2018   Procedure: RIGHT/LEFT HEART CATH AND CORONARY ANGIOGRAPHY;  Surgeon: Burnell Blanks, MD;  Location: [facility];  Service: Cardiovascular;  Laterality: N/A;  . TEE WITHOUT CARDIOVERSION N/A 01/10/2019   Procedure: TRANSESOPHAGEAL ECHOCARDIOGRAM (TEE);  Surgeon: Burnell Blanks, MD;  Location: [facility];  Service: Open Heart Surgery;  Laterality: N/A;  . TRANSCATHETER AORTIC VALVE REPLACEMENT, TRANSFEMORAL N/A 01/10/2019   Procedure: TRANSCATHETER AORTIC VALVE REPLACEMENT, TRANSFEMORAL;  Surgeon: Burnell Blanks, MD;  Location: [facility];  Service: Open Heart Surgery;  Laterality: N/A;  . TRANSCATHETER AORTIC VALVE REPLACEMENT, TRANSFEMORAL  12/2018  . YAG LASER APPLICATION     OS    Assessment & Plan Clinical Impression:  SELETHA ZIMMERMANN is a 73 year old female with history ofchronic diastolic CHF, COPD, Z6XW, chronic LBP,gait disorder with instability, moderate to severe leftASwho was admitted on 01/10/2019 TVAR by Dr. Caryl Bis. Postop noted to have issues with bilateral lower extremity weakness with myoclonic jerks, BUE dysmetria andinability to walk. Also noted to have lethargy with asterixis therefore neurology consulted for input. Dr. Leonel Ramsay consulted andfelt that patient withgabapentin toxicity due to increase in dose in setting ofacute on chronic renal failurewhich was likely contributing to the symptomsbut recommended neurological work up.MRI brain revealing multiple small areas of acute infarct in bilateral cerebral hemispheres c/w cardioembolism.CTA head/neck was negative for emergent large vessel occlusion and showed moderate to server R-ICA and moderate L-ICA stenosis.    Neurology felt that strokes were related to [facility] --no further work up indicated and recommended following up with [facility] on outpatient basis. Asterixis and lethargy has resolved. She  continue to have weakness with poor safety awareness and impulsivity, mild cognitive impairments,that is affecting mobility and ADL tasks. CIR recommended due to functional decline. Patient transferred to CIR on 01/16/2019 .   Patient currently requires min with mobility secondary to muscle weakness, ataxia and decreased coordination and decreased standing balance, decreased postural control and decreased balance strategies.  Prior to hospitalization, patient was modified independent  with mobility and lived with Family(2 daughters, 2 granddaughters and grand son-in-law) in a House home.  Home access is 2Stairs to enter.  Patient will benefit from skilled PT intervention to maximize safe functional mobility, minimize fall risk and decrease caregiver burden for planned discharge home with 24 hour supervision.  Anticipate patient will benefit from follow up [place] at discharge.  PT - End of Session Activity Tolerance: Tolerates 10 - 20 min activity with multiple rests Endurance Deficit: Yes Endurance Deficit Description: frequent rest breaks, keeps closing eyes during session PT Assessment Rehab Potential (ACUTE/IP ONLY): Good PT Barriers to Discharge: Medical stability PT Patient demonstrates impairments in the following area(s): Balance;Endurance;Motor;Safety PT Transfers Functional Problem(s): Bed Mobility;Bed to Chair;Car;Furniture;Floor PT Locomotion Functional Problem(s): Ambulation;Wheelchair Mobility;Stairs PT Plan PT Intensity: Minimum of 1-2 x/day ,45 to 90 minutes PT Frequency: 5 out of 7 days PT Duration Estimated Length of Stay: 7-10 days PT Treatment/Interventions: Ambulation/gait training;Balance/vestibular training;Cognitive remediation/compensation;Community reintegration;Discharge planning;Disease  management/prevention;DME/adaptive equipment instruction;Functional mobility training;Neuromuscular re-education;Pain management;Patient/family education;Psychosocial support;Stair  training;Therapeutic Activities;Therapeutic Exercise;UE/LE Strength taining/ROM;UE/LE Coordination activities;Wheelchair propulsion/positioning PT Transfers Anticipated Outcome(s): Supervision PT Locomotion Anticipated Outcome(s): Supervision with LRAD PT Recommendation Follow Up Recommendations: Home health PT Patient destination: Home Equipment Recommended: Rolling walker with 5" wheels;Wheelchair (measurements);Wheelchair cushion (measurements) Equipment Details: RW vs w/c pending progress  Skilled Therapeutic Intervention Evaluation completed (see details above and below) with education on PT POC and goals and individual treatment initiated with focus on functional transfer and gait assessment. Pt performs bed mobility with CGA. Sit to stand with min A to RW. Pt exhibits B knee buckling in standing, is able to perform stand pivot transfer to w/c with RW and min A. Manual w/c propulsion x 50 ft with use of BUE and Supervision, v/c for technique. Ambulation x 100 ft with RW and min A, ataxic and path deviation noted with gait especially with inattention to task, w/c follow for safety. Pt on 1L O2 at rest, decreased to room air as SpO2 95%. While seated on room air for several minutes SpO2 drops to 88%. Increased back to 1L O2, SpO2 at rest and with activity 95%. Pt left seated in w/c in room with needs in reach, quick release belt and chair alarm in place at end of session.   PT Evaluation Precautions/Restrictions Precautions Precautions: Fall Restrictions Weight Bearing Restrictions: No Pain Pain Assessment Pain Scale: 0-10 Pain Score: 0-No pain Home Living/Prior Functioning Home Living Available Help at Discharge: Family;Available 24 hours/day Type of Home: House Home Access: Stairs to enter [employer] of Steps: 2 Entrance Stairs-Rails: None Home Layout: One level Bathroom Shower/Tub: Multimedia programmer: Standard Additional Comments: Pt reports falls daily  with BLE buckling  Lives With: Family(2 daughters, 2 granddaughters and grand son-in-law) Prior Function Level of Independence: Requires assistive device for independence  Able to Take Stairs?: Yes Driving: Yes Vocation: Retired Comments: uses Museum/gallery conservator - History Baseline Vision: Wears glasses only for reading Perception Perception: Within Functional Limits Praxis Praxis: Intact  Cognition Overall Cognitive Status: Impaired/Different from baseline Arousal/Alertness: Awake/alert(slighly lethargic but denies ) Orientation Level: Oriented X4 Attention: Sustained Sustained Attention: Appears intact Selective Attention: Impaired Selective Attention Impairment: Verbal basic;Functional basic Alternating Attention: Impaired Memory: Impaired Memory Impairment: Storage deficit;Decreased short term memory Decreased Short Term Memory: Verbal basic;Functional basic Awareness: Impaired Awareness Impairment: Other (comment);Intellectual impairment;Emergent impairment(pt initial denies deficits) Problem Solving: Impaired(slow processing ) Problem Solving Impairment: Functional basic;Verbal basic Safety/Judgment: Appears intact(given verbal scenarios WFL) Sensation Sensation Light Touch: Appears Intact Proprioception: Appears Intact Coordination Gross Motor Movements are Fluid and Coordinated: No Fine Motor Movements are Fluid and Coordinated: Yes Coordination and Movement Description: impaired by generalized weakness Heel Shin Test: intact B Motor  Motor Motor: Other (comment)(myoclonic jerks in RLE)  Mobility Bed Mobility Bed Mobility: Rolling Right;Rolling Left;Supine to Sit;Sit to Supine Rolling Right: Contact Guard/Touching assist Rolling Left: Contact Guard/Touching assist Supine to Sit: Contact Guard/Touching assist Sit to Supine: Contact Guard/Touching assist Transfers Transfers: Sit to Stand;Stand to Sit;Stand Pivot Transfers Sit to Stand: Minimal  Assistance - Patient > 75% Stand to Sit: Minimal Assistance - Patient > 75% Stand Pivot Transfers: Minimal Assistance - Patient > 75% Stand Pivot Transfer Details: Verbal cues for sequencing;Verbal cues for technique;Verbal cues for precautions/safety;Verbal cues for safe use of DME/AE;Manual facilitation for weight shifting Transfer (Assistive device): Rolling walker Locomotion  Gait Gait Distance (Feet): 100 Feet Assistive device: Rolling walker Gait Gait Pattern: Impaired(B knee flexion, path deviation) Gait velocity:  decreased Stairs / Additional Locomotion Stairs: No Architect: Yes Wheelchair Assistance: Chartered loss adjuster: Both upper extremities Wheelchair Parts Management: Needs assistance Distance: 50  Trunk/Postural Assessment  Cervical Assessment Cervical Assessment: Within Functional Limits Thoracic Assessment Thoracic Assessment: Exceptions to WFL(rounded shoulders) Lumbar Assessment Lumbar Assessment: Exceptions to WFL(posterior pelvic tilt) Postural Control Postural Control: Within Functional Limits  Balance Balance Balance Assessed: Yes Static Sitting Balance Static Sitting - Balance Support: No upper extremity supported;Feet supported Static Sitting - Level of Assistance: 5: Stand by assistance Dynamic Sitting Balance Dynamic Sitting - Balance Support: No upper extremity supported;Feet supported;During functional activity Dynamic Sitting - Level of Assistance: 5: Stand by assistance Static Standing Balance Static Standing - Balance Support: Bilateral upper extremity supported;During functional activity Static Standing - Level of Assistance: 4: Min assist Dynamic Standing Balance Dynamic Standing - Balance Support: Bilateral upper extremity supported;During functional activity Dynamic Standing - Level of Assistance: 4: Min assist Extremity Assessment  RUE Assessment RUE Assessment: Within  Functional Limits LUE Assessment LUE Assessment: Within Functional Limits RLE Assessment RLE Assessment: Exceptions to [facility] Passive Range of Motion (PROM) Comments: [facility] General Strength Comments: 3+/5 grossly LLE Assessment LLE Assessment: Within Functional Limits Passive Range of Motion (PROM) Comments: WFL General Strength Comments: 4+/5 grossly    Refer to Care Plan for Long Term Goals  Recommendations for other services: None   Discharge Criteria: Patient will be discharged from PT if patient refuses treatment 3 consecutive times without medical reason, if treatment goals not met, if there is a change in medical status, if patient makes no progress towards goals or if patient is discharged from hospital.  The above assessment, treatment plan, treatment alternatives and goals were discussed and mutually agreed upon: by patient   Excell Seltzer, PT, DPT  01/17/2019, 3:56 PM

## 2019-01-17 NOTE — Patient Care Conference (Signed)
Inpatient RehabilitationTeam Conference and Plan of Care Update Date: 01/17/2019   Time: 2:25 PM    Patient Name: [name] [name]      Medical Record Number: [id number]  Date of Birth: 05-Dec-1946 Sex: Female         Room/Bed: 4W23C/4W23C-01 Payor Info: Payor: Theme park manager MEDICARE / Plan: [facility] MEDICARE / Product Type: *No Product type* /    Admitting Diagnosis: Debility after tsvr new cva  Admit Date/Time:  01/16/2019  6:05 PM Admission Comments: No comment available   Primary Diagnosis:  <principal problem not specified> Principal Problem: <principal problem not specified>  Patient Active Problem List   Diagnosis Date Noted  . Embolic stroke ([place]) 76/54/6503  . AKI (acute kidney injury) ([place]) 01/15/2019  . Drug toxicity 01/15/2019  . Intracranial carotid stenosis 01/15/2019  . 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 Severe aortic stenosis   . Rheumatoid arthritis ([place])   . Hypothyroidism   . Diabetes mellitus, type 2 ([place])   . Chronic diastolic CHF (congestive heart failure) ([place]) 08/18/2018  . CKD (chronic kidney disease) stage 3, GFR 30-59 ml/min (HCC) 08/18/2018  . Frequent falls 02/02/2018  . History of gastric ulcer 10/16/2016  . Dysphagia 10/16/2016  . Spondylolisthesis of lumbar region 08/03/2014  . Essential hypertension 08/02/2014  . Hyperlipidemia 08/02/2014  . Primary osteoarthritis of both knees 06/14/2014  . Right leg weakness 10/31/2013  . H/O adenomatous polyp of colon 08/17/2012  . Family history of colon cancer 08/17/2012  . Chronic nausea 08/17/2012    Expected Discharge Date: Expected Discharge Date: 01/25/19  Team Members Present: Physician leading conference: Dr. Alger Simons Social Worker Present: Lennart Pall, LCSW Nurse Present: Rayetta Humphrey, RN PT Present: Other (comment)(Taylor Ervin Knack, PT) OT Present: Darleen Crocker, OT SLP Present: Charolett Bumpers, SLP PPS Coordinator present : Gunnar Fusi     Current  Status/Progress Goal Weekly Team Focus  Medical   Patient admitted with Bi cerebral embolic infarcts after aortic valve replacement.  Having difficulties with acute on chronic renal failure as well as altered mental status.  Blood sugars also labile  Improve activity tolerance, optimize volume and renal status  See above   Bowel/Bladder   continent of bowel & bladdwr         Swallow/Nutrition/ Hydration             ADL's   supervision - min guard  supervision  endurance, self care retraining, balance, functional mobility/transfer   Mobility   min A transfers, gait x 100' with RW min A, min A w/c mobility  Supervision overall  endurance, increasing independence with functional mobility, decreasing O2 reliance   Communication             Safety/Cognition/ Behavioral Observations  Mod-Min A   Min-Supervision A  functional problem solving, awareness intellectual/error, memory strategies, sustained attention    Pain             Skin              Rehab Goals Patient on target to meet rehab goals: Yes *See Care Plan and progress notes for long and short-term goals.     Barriers to Discharge  Current Status/Progress Possible Resolutions Date Resolved   Physician    Medical stability        See medical problem list for details of medical management      Nursing  PT  Medical stability                 OT New oxygen                SLP                SW                Discharge Planning/Teaching Needs:  Home with family able to provide 24/7 assistance.  Teaching needs TBD   Team Discussion:  New eval.  Encephalopathic likely due to meds and CKI.  Need to push fluids.  May need O2 for home at d/c.  Min assist transfers with some buckling of legs.  Groggy with OT on eval.  Basic problem solving deficits and poor STM.  Goals for supervision overall.  Revisions to Treatment Plan:  NA    Continued Need for Acute Rehabilitation Level of Care: The patient  requires daily medical management by a physician with specialized training in physical medicine and rehabilitation for the following conditions: Daily direction of a multidisciplinary physical rehabilitation program to ensure safe treatment while eliciting the highest outcome that is of practical value to the patient.: Yes Daily analysis of laboratory values and/or radiology reports with any subsequent need for medication adjustment of medical intervention for : Neurological problems;Cardiac problems;Diabetes problems;Renal problems   I attest that I was present, lead the team conference, and concur with the assessment and plan of the team.   Kobi Mario 01/18/2019, 12:55 PM

## 2019-01-18 ENCOUNTER — Inpatient Hospital Stay (HOSPITAL_COMMUNITY): Payer: Self-pay | Admitting: Physical Therapy

## 2019-01-18 ENCOUNTER — Inpatient Hospital Stay (HOSPITAL_COMMUNITY): Payer: Self-pay | Admitting: Occupational Therapy

## 2019-01-18 ENCOUNTER — Inpatient Hospital Stay (HOSPITAL_COMMUNITY): Payer: Self-pay | Admitting: Speech Pathology

## 2019-01-18 ENCOUNTER — Ambulatory Visit: Payer: Self-pay | Admitting: Physician Assistant

## 2019-01-18 LAB — BASIC METABOLIC PANEL
Anion gap: 8 (ref 5–15)
BUN: 57 mg/dL — ABNORMAL HIGH (ref 8–23)
CO2: 27 mmol/L (ref 22–32)
Calcium: 8.4 mg/dL — ABNORMAL LOW (ref 8.9–10.3)
Chloride: 99 mmol/L (ref 98–111)
Creatinine, Ser: 3.04 mg/dL — ABNORMAL HIGH (ref 0.44–1.00)
GFR calc Af Amer: 17 mL/min — ABNORMAL LOW (ref >60)
GFR calc non Af Amer: 15 mL/min — ABNORMAL LOW (ref >60)
Glucose, Bld: 113 mg/dL — ABNORMAL HIGH (ref 70–99)
POTASSIUM: 4.8 mmol/L (ref 3.5–5.1)
Sodium: 134 mmol/L — ABNORMAL LOW (ref 135–145)

## 2019-01-18 LAB — GLUCOSE, CAPILLARY
GLUCOSE-CAPILLARY: 153 mg/dL — AB (ref 70–99)
GLUCOSE-CAPILLARY: 222 mg/dL — AB (ref 70–99)
Glucose-Capillary: 111 mg/dL — ABNORMAL HIGH (ref 70–99)
Glucose-Capillary: 224 mg/dL — ABNORMAL HIGH (ref 70–99)

## 2019-01-18 LAB — CBC
HEMATOCRIT: 25.9 % — AB (ref 36.0–46.0)
HEMOGLOBIN: 8.3 g/dL — AB (ref 12.0–15.0)
MCH: 28.4 pg (ref 26.0–34.0)
MCHC: 32 g/dL (ref 30.0–36.0)
MCV: 88.7 fL (ref 80.0–100.0)
Platelets: 161 10*3/uL (ref 150–400)
RBC: 2.92 MIL/uL — ABNORMAL LOW (ref 3.87–5.11)
RDW: 15 % (ref 11.5–15.5)
WBC: 8.2 10*3/uL (ref 4.0–10.5)
nRBC: 0 % (ref 0.0–0.2)

## 2019-01-18 MED ORDER — SODIUM CHLORIDE 0.45 % IV SOLN
INTRAVENOUS | Status: DC
  Administered 2019-01-18 – 2019-01-19 (×2): via INTRAVENOUS

## 2019-01-18 MED ORDER — TRAMADOL HCL 50 MG PO TABS: 25.0000 mg | ORAL_TABLET | Freq: Two times a day (BID) | ORAL | Status: DC | PRN

## 2019-01-18 MED ORDER — TRAMADOL HCL 50 MG PO TABS: 50.0000 mg | ORAL_TABLET | Freq: Three times a day (TID) | ORAL | Status: DC | PRN

## 2019-01-18 NOTE — Progress Notes (Signed)
Social Work Patient ID: [name] [name], female   DOB: 03-Mar-1946, 73 y.o.   MRN: [id number]   Have reviewed team conference with pt and left VM for daughter, [name]. Informed both of targeted d/c date of 2/12 and goals of supervision overall.  Pt agreeable.  Continue to follow.  Kanae Ignatowski, LCSW

## 2019-01-18 NOTE — Progress Notes (Signed)
Speech Language Pathology Daily Session Note  Patient Details  Name: [name] [name] MRN: [id number] Date of Birth: [birth year]  Today's Date: 01/18/2019 SLP Individual Time: 1100-1200 SLP Individual Time Calculation (min): 60 min  Short Term Goals: Week 1: SLP Short Term Goal 1 (Week 1): STG=LTG due to short ELOS  Skilled Therapeutic Interventions: Pt was seen for skilled ST focused on cognition. SLP facilitated session with money management activity to target problem solving goals. Pt completed basic calculations with supervision and intermittent min verbal cueing for organization of task and error detection. Pt also completed a novel card task to further target problem solving skills with a model and subsequent min verbal/visual cues from the clinician. Throughout the session pt sustained attention for approximately 5-7 minute intervals before requiring min cues for redirection to task. Of note, pt also produced a continent bowel movement during session; RN made aware. Pt was left eating lunch in wheelchair with chair alarm set, call bell within reach, and all needs met. Recommend continue per current plan of care.      Pain Pain Assessment Pain Scale: 0-10 Pain Score: 0-No pain  Therapy/Group: Individual Therapy   Jettie Booze, Student SLP   Jettie Booze 01/18/2019, 3:39 PM

## 2019-01-18 NOTE — Care Management (Signed)
Inpatient [facility] Individual Statement of Services  Patient Name:  [name] [name]  Date:  01/18/2019  Welcome to the [facility].  Our goal is to provide you with an individualized program based on your diagnosis and situation, designed to meet your specific needs.  With this comprehensive rehabilitation program, you will be expected to participate in at least 3 hours of rehabilitation therapies Monday-Friday, with modified therapy programming on the weekends.  Your rehabilitation program will include the following services:  Physical Therapy (PT), Occupational Therapy (OT), Speech Therapy (ST), 24 hour per day rehabilitation nursing, Therapeutic Recreaction (TR), Neuropsychology, Case Management (Social Worker), Rehabilitation Medicine, Nutrition Services and Pharmacy Services  Weekly team conferences will be held on Tuesdays to discuss your progress.  Your Social Worker will talk with you frequently to get your input and to update you on team discussions.  Team conferences with you and your family in attendance may also be held.  Expected length of stay: 7-10 days   Overall anticipated outcome: supervision  Depending on your progress and recovery, your program may change. Your Social Worker will coordinate services and will keep you informed of any changes. Your Social Worker's name and contact numbers are listed  below.  The following services may also be recommended but are not provided by the [facility] will be made to provide these services after discharge if needed.  Arrangements include referral to agencies that provide these services.  Your insurance has been verified to be:  [facility] Medicare Your primary doctor is:  Dr. Gerarda Fraction  Pertinent information will be shared with your doctor and your insurance company.  Social  Worker:  [place], [place] or ([phone]  Information discussed with and copy given to patient by: Lennart Pall, 01/18/2019, 12:56 PM

## 2019-01-18 NOTE — Progress Notes (Signed)
Physical Therapy Session Note  Patient Details  Name: [name] [name] MRN: [id number] Date of Birth: 1946-02-11  Today's Date: 01/18/2019 PT Individual Time: 0900-1015 PT Individual Time Calculation (min): 75 min   Short Term Goals: Week 1:  PT Short Term Goal 1 (Week 1): =LTG due to ELOS  Skilled Therapeutic Interventions/Progress Updates:    Pt received seated on toilet in room, agreeable to PT session. No complaints of pain. Toilet transfer with CGA. Standing balance at sink with CGA while washing hands. Ambulation 2 x 150 ft with RW and min A for balance, v/c for increased BLE clearance as pt tends to shuffle due to wearing house slippers. Ascend/descend 2 x 4 6" steps with 2 handrails and min A, step-to gait pattern. Trial ambulation with rollator and min A, pt exhibits decreased safety and tends to push rollator too far ahead of her during gait, will continue to progress towards using rollator as this is AD pt has at home. Car transfer with min A with RW, v/c for safe transfer technique. Berg Balance Test 12/56, high fall risk. Pt tends to fall posteriorly in standing without use of AD for support. Education with patient about fall risk and balance test results. Pt on room air throughout session and SpO2 remains at 95% and above even with activity. Pt left seated in w/c in room with needs in reach, quick release belt and chair alarm in place.  Therapy Documentation Precautions:  Precautions Precautions: Fall Restrictions Weight Bearing Restrictions: No Balance: Balance Balance Assessed: Yes Standardized Balance Assessment Standardized Balance Assessment: Berg Balance Test Berg Balance Test Sit to Stand: Able to stand  independently using hands Standing Unsupported: Unable to stand 30 seconds unassisted Sitting with Back Unsupported but Feet Supported on Floor or Stool: Able to sit safely and securely 2 minutes Stand to Sit: Uses backs of legs against chair to control  descent Transfers: Needs one person to assist Standing Unsupported with Eyes Closed: Needs help to keep from falling Standing Ubsupported with Feet Together: Needs help to attain position and unable to hold for 15 seconds From Standing, Reach Forward with Outstretched Arm: Loses balance while trying/requires external support From Standing Position, Pick up Object from Floor: Unable to try/needs assist to keep balance From Standing Position, Turn to Look Behind Over each Shoulder: Turn sideways only but maintains balance Turn 360 Degrees: Needs assistance while turning Standing Unsupported, Alternately Place Feet on Step/Stool: Needs assistance to keep from falling or unable to try Standing Unsupported, One Foot in Front: Loses balance while stepping or standing Standing on One Leg: Unable to try or needs assist to prevent fall Total Score: 12    Therapy/Group: Individual Therapy   Excell Seltzer, PT, DPT  01/18/2019, 11:46 AM

## 2019-01-18 NOTE — Progress Notes (Signed)
Occupational Therapy Session Note  Patient Details  Name: [name] [name] MRN: [id number] Date of Birth: Jan 11, 1946  Today's Date: 01/18/2019 OT Individual Time: 1537-9432 OT Individual Time Calculation (min): 72 min    Short Term Goals: Week 1:  OT Short Term Goal 1 (Week 1): STGs=LTGs secondary to estimated short LOS  Skilled Therapeutic Interventions/Progress Updates:    Upon entering the room, pt seated in wheelchair with no c/o pain but pt reports fatigue. Pt agreeable to OT intervention with encouragement. Pt ambulating to bathroom with min A and use of RW. Pt able to void urine but unable to have BM this session. Pt able to preform hygiene and clothing management with min guard for balance. Standing at sink with min guard for hand hygiene. Pt currently getting fluids via IV and unable to shower but requested assistance with washing hair and drying hair at sink. Pt assisting when able but fatigues quickly. Pt transferring back to bed with min A and use of RW. Pt applying lotion to skin from bed level with set up A to obtain needed items. Pt remained in bed with call bell and all needed items within reach.   Therapy Documentation Precautions:  Precautions Precautions: Fall Restrictions Weight Bearing Restrictions: No General:   Vital Signs: Therapy Vitals Temp: 97.8 F (36.6 C) Temp Source: Oral Pulse Rate: 68 Resp: 19 BP: 140/68 Patient Position (if appropriate): Sitting Oxygen Therapy SpO2: 100 % O2 Device: Room Air   Therapy/Group: Individual Therapy  Gypsy Decant 01/18/2019, 2:16 PM

## 2019-01-18 NOTE — Progress Notes (Addendum)
[place] PHYSICAL MEDICINE & REHABILITATION PROGRESS NOTE   Subjective/Complaints: Patient in bed starting to eat her breakfast.  Appears more alert today.  I made the comment that she was sleepy yesterday and patient replied that "they woke me up the entire night before"  ROS: Patient denies fever, rash, sore throat, blurred vision, nausea, vomiting, diarrhea, cough, shortness of breath or chest pain, joint or back pain, headache, or mood change.    Objective:   No results found. Recent Labs    01/17/19 0638 01/18/19 0634  WBC 10.1 8.2  HGB 8.7* 8.3*  HCT 26.7* 25.9*  PLT 158 161   Recent Labs    01/17/19 0638 01/18/19 0634  NA 136 134*  K 4.8 4.8  CL 96* 99  CO2 26 27  GLUCOSE 86 113*  BUN 53* 57*  CREATININE 3.75* 3.04*  CALCIUM 8.4* 8.4*    Intake/Output Summary (Last 24 hours) at 01/18/2019 0913 Last data filed at 01/18/2019 0700 Gross per 24 hour  Intake 600 ml  Output -  Net 600 ml     Physical Exam: Vital Signs Blood pressure 136/79, pulse 76, temperature 98.3 F (36.8 C), temperature source Oral, resp. rate 17, height 4\' 9"  (1.448 m), weight 67 kg, SpO2 97 %. Constitutional: No distress . Vital signs reviewed. HEENT: EOMI, oral membranes moist Neck: supple Cardiovascular: RRR without murmur. No JVD    Respiratory: CTA Bilaterally without wheezes or rales. Normal effort    GI: BS +, non-tender, non-distended   Musculoskeletal:  General: Edema(1+ pitting edema bilateral shins. )present.  Neuro: alert, oriented to person and place.  Seems more distracted.  Moves all 4's 4- to 4/5, no sensory abnl Psych: Slightly irritable   Assessment/Plan: 1. 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  Physiatrist is providing close team supervision and 24 hour management of active medical problems listed below.  Physiatrist and  rehab team continue to assess barriers to discharge/monitor patient progress toward functional and medical goals  Care Tool:  Bathing  Bathing activity did not occur: Refused           Bathing assist       Upper Body Dressing/Undressing Upper body dressing   What is the patient wearing?: Hospital gown only    Upper body assist Assist Level: Set up assist    Lower Body Dressing/Undressing Lower body dressing            Lower body assist       Toileting Toileting    Toileting assist Assist for toileting: Contact Guard/Touching assist     Transfers Chair/bed transfer  Transfers assist     Chair/bed transfer assist level: Minimal Assistance - Patient > 75%     Locomotion Ambulation   Ambulation assist      Assist level: Minimal Assistance - Patient > 75% Assistive device: Walker-rolling Max distance: 100'   Walk 10 feet activity   Assist     Assist level: Minimal Assistance - Patient > 75% Assistive device: Walker-rolling   Walk 50 feet activity   Assist    Assist level: Minimal Assistance - Patient > 75% Assistive device: Walker-rolling    Walk 150 feet activity   Assist Walk 150 feet activity did not occur: Safety/medical concerns         Walk 10 feet on uneven surface  activity   Assist Walk 10 feet on uneven surfaces  activity did not occur: Safety/medical concerns         Wheelchair     Assist Will patient use wheelchair at discharge?: (TBD) Type of Wheelchair: Manual    Wheelchair assist level: Supervision/Verbal cueing Max wheelchair distance: 11'    Wheelchair 50 feet with 2 turns activity    Assist        Assist Level: Supervision/Verbal cueing   Wheelchair 150 feet activity     Assist Wheelchair 150 feet activity did not occur: Safety/medical concerns        Medical Problem List and Plan: 1.Functional deficitssecondary to 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 DVT Prophylaxis/Anticoagulation: Mechanical:Sequential compression devices, below kneeBilateral lower extremities 3.Chronic LBP/Pain Management:Used hydrocodone and gabapentin at home. 4. Mood:LCSW to follow for evaluation and support. 5. Neuropsych: This patientIs not fullycapable of making decisions on herown behalf. 6. Skin/Wound Care:Routine pressure relief measures 7. Fluids/Electrolytes/Nutrition:BUN and creatinine really have not changed.  BUN 57 creatinine 3.04 today.  -We will begin continuous IV fluids.  -Lasix held 8. T2DM: Continue to glucotrol but hold metformin due to rising creatinine.. Will monitor BS ac/hs.  9. COPD:Add flutter valve.Encourage ISand weanoxygen to off.Asking for her inhaler--will add nebs prn for now. 10. Chronic diastolic CHF:     ASA, Plavix, coreg--  changed Zocor to Lipitor to avoid interaction with amlodipine.  -need daily weights Filed Weights   01/16/19 1833  Weight: 67 kg   11. Acute on CKD: Push fluids, serial checks.   - Held Lasix 12. HTN: Monitor BP bid. Norvasc added on 1/31 for better control.    13. Glaucoma: Stable - continue Timoptic and Xalatan.  11.MYTRZ metabolic encephalopathy:Improving, likelydue to worsening of CKD as well as gabapentin.  15. Anemia of chronic disease: Continue iron supplement every other day.   -Hemoglobin slightly decreased further to 8.3, check stool for occult blood.  Continue to monitor.   -Follow serially. 6. Leucocytosis: Monitor temps, incision for healing and for other signs of infection.   -WBCs are 8.2 today  LOS: 2 days A FACE TO FACE EVALUATION WAS PERFORMED  Meredith Staggers 01/18/2019, 9:13 AM

## 2019-01-19 ENCOUNTER — Inpatient Hospital Stay (HOSPITAL_COMMUNITY): Payer: Self-pay | Admitting: Occupational Therapy

## 2019-01-19 ENCOUNTER — Encounter: Payer: Self-pay | Admitting: Thoracic Surgery (Cardiothoracic Vascular Surgery)

## 2019-01-19 ENCOUNTER — Inpatient Hospital Stay (HOSPITAL_COMMUNITY): Payer: Self-pay | Admitting: Speech Pathology

## 2019-01-19 ENCOUNTER — Inpatient Hospital Stay (HOSPITAL_COMMUNITY): Payer: Self-pay

## 2019-01-19 DIAGNOSIS — D72823 Leukemoid reaction: Secondary | ICD-10-CM

## 2019-01-19 LAB — BASIC METABOLIC PANEL
ANION GAP: 10 (ref 5–15)
BUN: 46 mg/dL — ABNORMAL HIGH (ref 8–23)
CALCIUM: 8.5 mg/dL — AB (ref 8.9–10.3)
CO2: 26 mmol/L (ref 22–32)
Chloride: 101 mmol/L (ref 98–111)
Creatinine, Ser: 2.17 mg/dL — ABNORMAL HIGH (ref 0.44–1.00)
GFR calc Af Amer: 25 mL/min — ABNORMAL LOW (ref >60)
GFR calc non Af Amer: 22 mL/min — ABNORMAL LOW (ref >60)
Glucose, Bld: 104 mg/dL — ABNORMAL HIGH (ref 70–99)
Potassium: 4.1 mmol/L (ref 3.5–5.1)
Sodium: 137 mmol/L (ref 135–145)

## 2019-01-19 LAB — GLUCOSE, CAPILLARY
GLUCOSE-CAPILLARY: 159 mg/dL — AB (ref 70–99)
GLUCOSE-CAPILLARY: 158 mg/dL — AB (ref 70–99)
Glucose-Capillary: 107 mg/dL — ABNORMAL HIGH (ref 70–99)
Glucose-Capillary: 215 mg/dL — ABNORMAL HIGH (ref 70–99)

## 2019-01-19 LAB — CBC
HCT: 25.7 % — ABNORMAL LOW (ref 36.0–46.0)
Hemoglobin: 8.3 g/dL — ABNORMAL LOW (ref 12.0–15.0)
MCH: 28.1 pg (ref 26.0–34.0)
MCHC: 32.3 g/dL (ref 30.0–36.0)
MCV: 87.1 fL (ref 80.0–100.0)
Platelets: 182 10*3/uL (ref 150–400)
RBC: 2.95 MIL/uL — ABNORMAL LOW (ref 3.87–5.11)
RDW: 14.7 % (ref 11.5–15.5)
WBC: 8.6 10*3/uL (ref 4.0–10.5)
nRBC: 0 % (ref 0.0–0.2)

## 2019-01-19 MED ORDER — LIVING WELL WITH DIABETES BOOK
Freq: Once | Status: AC
  Administered 2019-01-19: 17:00:00
  Filled 2019-01-19: qty 1

## 2019-01-19 NOTE — Progress Notes (Signed)
Patient with increased confusion this am--received oxycodone 5 mg as well as trazadone 50 mg last pm. Will d/c all sedating medications --keep home dose Xanax HS prn pm board for insomnia. Encourage fluids--AKI improving. Will monitor for now.

## 2019-01-19 NOTE — Progress Notes (Signed)
Speech Language Pathology Daily Session Note  Patient Details  Name: [name] [name] MRN: [id number] Date of Birth: 07-Nov-1946  Today's Date: 01/19/2019 SLP Individual Time: 0900-1000 SLP Individual Time Calculation (min): 60 min  Short Term Goals: Week 1: SLP Short Term Goal 1 (Week 1): STG=LTG due to short ELOS  Skilled Therapeutic Interventions: Pt was seen for skilled ST focused on cognitive goals. SLP facilitated session with introduction of memory notebook to target memory goals and implement the use of external aids. Pt required mod verbal cues to recall events of her hospitalization and reasoning for stay in CIR. Throughout session, pt became increasing confused and disoriented to time, place, and situation. SLP provided support and external aids within pt's visual field around the room to increase her ability to orient herself in moments of confusion. Pt was intermittently able to state some awareness of her confusion and associate hospital stay with recent stroke. RN made aware of pt's confusion. Pt was left in bed with alarm set, call bell within reach, and all needs met. Recommend continue per current plan of care.     Pain Pain Assessment Pain Scale: 0-10 Pain Score: 0-No pain  Therapy/Group: Individual Therapy   Jettie Booze, Student SLP   Jettie Booze 01/19/2019, 12:27 PM

## 2019-01-19 NOTE — Progress Notes (Signed)
Social Work  Social Work Assessment and Plan  Patient Details  Name: [name] [name] MRN: [id number] Date of Birth: 17-Feb-1946  Today's Date: 01/19/2019  Problem List:  Patient Active Problem List   Diagnosis Date Noted  . Embolic stroke ([place]) 62/94/7654  . AKI (acute kidney injury) ([place]) 01/15/2019  . Drug toxicity 01/15/2019  . Intracranial carotid stenosis 01/15/2019  . 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 Severe aortic stenosis   . Rheumatoid arthritis ([place])   . Hypothyroidism   . Diabetes mellitus, type 2 ([place])   . Chronic diastolic CHF (congestive heart failure) ([place]) 08/18/2018  . CKD (chronic kidney disease) stage 3, GFR 30-59 ml/min (HCC) 08/18/2018  . Frequent falls 02/02/2018  . History of gastric ulcer 10/16/2016  . Dysphagia 10/16/2016  . Spondylolisthesis of lumbar region 08/03/2014  . Essential hypertension 08/02/2014  . Hyperlipidemia 08/02/2014  . Primary osteoarthritis of both knees 06/14/2014  . Right leg weakness 10/31/2013  . H/O adenomatous polyp of colon 08/17/2012  . Family history of colon cancer 08/17/2012  . Chronic nausea 08/17/2012   Past Medical History:  Past Medical History:  Diagnosis Date  . Anemia   . Anxiety   . Arthritis   . Blood transfusion without reported diagnosis   . Bronchitis   . CHF (congestive heart failure) ([place])   . CKD (chronic kidney disease) stage 3, GFR 30-59 ml/min (HCC) 08/18/2018  . Depression   . Diabetes mellitus   . Diverticulitis   . Glaucoma   . Hyperlipidemia   . Hypertension   . Hypothyroidism   . Macular degeneration   . Melanoma ([place])    left thigh, 2010  . Osteoarthritis   . Peptic ulcer   . Rheumatoid arthritis(714.0)   . 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 Severe aortic stenosis    Past Surgical History:  Past Surgical History:   Procedure Laterality Date  . ABDOMINAL HYSTERECTOMY  1988  . APPENDECTOMY    . BACK SURGERY  07/2008   fusion  . BACK SURGERY  05/5034   complicated by post-op anemia requiring transfusion  . BREAST SURGERY     right breast biopsy  . CARDIAC CATHETERIZATION    . CATARACT EXTRACTION    . CHOLECYSTECTOMY  1974  . COLONOSCOPY  06/06/2007   Proximal diminutive rectal polyps cold biopsied/removed, otherwise normal rectum/ Left-sided diverticula, the remainder of the colonic mucosa appeared normal. Path-tubular adnemoa.   . COLONOSCOPY  09/14/2012   WSF:KCLEXN polyps-removed as described  above Colonic diverticulosis. Poor prep. next TCS five years.   . COLONOSCOPY N/A 10/29/2016   Procedure: COLONOSCOPY;  Surgeon: Daneil Dolin, MD;  [facility];  Service: Endoscopy;  Laterality: N/A;  2:00 pm  . ESOPHAGOGASTRODUODENOSCOPY  09/2012   Rourk: gastric ulcer, gastric duodenal erosions, small hh. benign bx  . ESOPHAGOGASTRODUODENOSCOPY N/A 10/29/2016   Procedure: ESOPHAGOGASTRODUODENOSCOPY (EGD);  Surgeon: Daneil Dolin, MD;  [facility];  Service: Endoscopy;  Laterality: N/A;  . EYE SURGERY    . FOOT SURGERY  1994   left   . LUMBAR SPINE SURGERY  08/04/2014   dr Joya Salm  . macular degen    . MALONEY DILATION N/A 10/29/2016   Procedure: Venia Minks DILATION;  Surgeon: Daneil Dolin, MD;  [facility];  Service: Endoscopy;  Laterality: N/A;  . MELANOMA EXCISION  2010  . MULTIPLE EXTRACTIONS WITH ALVEOLOPLASTY N/A 08/17/2018   Procedure: Extraction of tooth #'s 7,8, and 9 with alveoloplasty and gross debridement of remaining teeth;  Surgeon: Lenn Cal, DDS;  Location: [facility];  Service: Oral Surgery;  Laterality: N/A;  . RIGHT/LEFT HEART CATH AND CORONARY ANGIOGRAPHY N/A 08/11/2018   Procedure: RIGHT/LEFT HEART CATH AND CORONARY ANGIOGRAPHY;  Surgeon: Burnell Blanks, MD;  Location: [facility];  Service: Cardiovascular;  Laterality: N/A;  .  TEE WITHOUT CARDIOVERSION N/A 01/10/2019   Procedure: TRANSESOPHAGEAL ECHOCARDIOGRAM (TEE);  Surgeon: Burnell Blanks, MD;  Location: [facility];  Service: Open Heart Surgery;  Laterality: N/A;  . TRANSCATHETER AORTIC VALVE REPLACEMENT, TRANSFEMORAL N/A 01/10/2019   Procedure: TRANSCATHETER AORTIC VALVE REPLACEMENT, TRANSFEMORAL;  Surgeon: Burnell Blanks, MD;  Location: [facility];  Service: Open Heart Surgery;  Laterality: N/A;  . TRANSCATHETER AORTIC VALVE REPLACEMENT, TRANSFEMORAL  12/2018  . YAG LASER APPLICATION     OS   Social History:  reports that she has never smoked. She has never used smokeless tobacco. She reports that she does not drink alcohol or use drugs.  Family / Support Systems Marital Status: Widow/Widower How Long?: 2012 Patient Roles: Parent Children: daughter, [name] [name] @ [phone] (in the home);  daughter, [name] [name] (local) @ (C) [phone];  daughter, [name] [name] (in the home) Other Supports: grandaughter, [name] [name] and her fiance are also living in the home. Anticipated Caregiver: all 5 family members (DJ present during admission process) Ability/Limitations of Caregiver: Min A Caregiver Availability: 24/7 Family Dynamics: Pt describes all family as very supportive and denies any concerns about support at home.  Social History Preferred language: English Religion: [employer] Cultural Background: NA Read: Yes Write: Yes Employment Status: Retired Public relations account executive Issues: None Guardian/Conservator: None - per MD, pt is not fully capable of making decisions on her own behalf.   Abuse/Neglect Abuse/Neglect Assessment Can Be Completed: Yes Physical Abuse: Denies Verbal Abuse: Denies Sexual Abuse: Denies Exploitation of patient/patient's resources: Denies Self-Neglect: Denies  Emotional Status Pt's affect, behavior and adjustment status: Pt very pleasant and completes assessment interview without  any difficulty despite staff reports of being very lethargic the day before.  Pt denies any significant emotional distress, however, will monitor and refer for neuropsychology as indicated. Recent Psychosocial Issues: None Psychiatric History: None per pt Substance Abuse History: None  Patient / Family Perceptions, Expectations & Goals Pt/Family understanding of illness & functional limitations: Pt states that she was admitted for planned TAVR "... but then during the surgery it's like a lot of little blood clots all went to my brain.  A lot of little strokes."  Good, basic understanding of medical issues and current functional limitations/ need for CIR. Premorbid pt/family roles/activities: Pt was using a walker for mobility but completely independent and still driving. Anticipated changes in roles/activities/participation: per goals of supervision, family will need to provide caregiver support. Pt/family expectations/goals: "I need to get stronger."  [employer]: None Premorbid Home Care/DME Agencies: None Transportation available at discharge: yes Resource referrals recommended: Neuropsychology  Discharge Planning Living Arrangements: Children, Other relatives Support Systems: Children, Other relatives Type of Residence: Private residence Insurance Resources: Medicare(UHC Medicare) Museum/gallery curator Resources: Radio broadcast assistant Screen Referred: No Living Expenses: Own Money Management: Patient Does the patient have any problems obtaining your medications?: No Home Management: pt and family share responsibilities Patient/Family Preliminary Plans: Pt to return home with family able to provide 24/7 assistance. Social Work  Anticipated Follow Up Needs: HH/OP Expected length of stay: 7-10 days  Clinical Impression Very pleasant woman on CIR following TAVR surgery and multiple small infarcts/ deconditioning.  Excellent family support with 5 members living in  the home.  Pt denies any emotional distress, however, will monitor.  To follow for support and d/c planning needs.  Quinten Allerton 01/19/2019, 12:51 PM

## 2019-01-19 NOTE — IPOC Note (Signed)
Overall Plan of Care [facility]) Patient Details Name: [name] [name] MRN: [id number] DOB: [birth year]  Admitting Diagnosis: <principal problem not specified>  Hospital Problems: Active Problems:   Embolic stroke [facility])     Functional Problem List: Nursing Bladder, Pain, Motor, Safety, Endurance  PT Balance, Endurance, Motor, Safety  OT Balance, Cognition, Endurance, Motor, Safety, Perception  SLP Cognition  TR         Basic ADL's: OT Grooming, Bathing, Dressing, Toileting     Advanced  ADL's: OT       Transfers: PT Bed Mobility, Bed to Chair, Car, [employer], Futures trader, Metallurgist: PT Ambulation, Emergency planning/management officer, Stairs     Additional Impairments: OT None  SLP Social Cognition   Attention, Awareness, Problem Solving, Memory  TR      Anticipated Outcomes Item Anticipated Outcome  Self Feeding n/a  Swallowing      Basic self-care  Supervision  Toileting  Supervision   Bathroom Transfers Supervision  Bowel/Bladder  patient will manage bowel and bladder with mod i assist at discharge  Transfers  Supervision  Locomotion  Supervision with LRAD  Communication     Cognition  Min-Sup A  Pain  Pt will manage pain at 3 or less on a scale of 0-10.   Safety/Judgment  Pt will remain free of falls and injury with min assist while in rehab.    Therapy Plan: PT Intensity: Minimum of 1-2 x/day ,45 to 90 minutes PT Frequency: 5 out of 7 days PT Duration Estimated Length of Stay: 7-10 days OT Intensity: Minimum of 1-2 x/day, 45 to 90 minutes OT Frequency: 5 out of 7 days OT Duration/Estimated Length of Stay: 7-10 days SLP Intensity: Minumum of 1-2 x/day, 30 to 90 minutes SLP Frequency: 3 to 5 out of 7 days SLP Duration/Estimated Length of Stay: 2/12    Team Interventions: Nursing Interventions Patient/Family Education, Bladder Management, Disease Management/Prevention, Pain Management, Discharge Planning, Cognitive  Remediation/Compensation  PT interventions Ambulation/gait training, Balance/vestibular training, Cognitive remediation/compensation, Community reintegration, Discharge planning, Disease management/prevention, DME/adaptive equipment instruction, Functional mobility training, Neuromuscular re-education, Pain management, Patient/family education, Psychosocial support, Stair training, Therapeutic Activities, Therapeutic Exercise, UE/LE Strength taining/ROM, UE/LE Coordination activities, Wheelchair propulsion/positioning  OT Interventions Training and development officer, Disease mangement/prevention, Neuromuscular re-education, Self Care/advanced ADL retraining, Therapeutic Exercise, Cognitive remediation/compensation, DME/adaptive equipment instruction, Pain management, Skin care/wound managment, UE/LE Strength taining/ROM, Community reintegration, Barrister's clerk education, UE/LE Coordination activities, Discharge planning, Functional mobility training, Psychosocial support, Therapeutic Activities  SLP Interventions Cognitive remediation/compensation, Cueing hierarchy, Functional tasks, Internal/external aids  TR Interventions    SW/CM Interventions Discharge Planning, Psychosocial Support, Patient/Family Education   Barriers to Discharge MD  Medical stability  Nursing      PT Medical stability    OT New oxygen    SLP      SW       Team Discharge Planning: Destination: PT-Home ,OT- Home , SLP-Home Projected Follow-up: PT-Home health PT, OT-  24 hour supervision/assistance, SLP-Home Health SLP, 24 hour supervision/assistance Projected Equipment Needs: PT-Rolling walker with 5" wheels, Wheelchair (measurements), Wheelchair cushion (measurements), OT- To be determined, SLP-None recommended by SLP Equipment Details: PT-RW vs w/c pending progress, OT-  Patient/family involved in discharge planning: PT- Patient,  OT-Patient, SLP-Patient  MD ELOS: 7-10 days Medical Rehab Prognosis:   Excellent Assessment: The patient has been admitted for CIR therapies with the diagnosis of cva,debility. The team will be addressing functional mobility, strength, stamina, balance, safety, adaptive techniques and equipment, self-care, bowel  and bladder mgt, patient and caregiver education, NMR, cognition, communication, ego support. Goals have been set at supervision for mobility and self-care and sup/min assist for cognition.    Meredith Staggers, MD, FAAPMR      See Team Conference Notes for weekly updates to the plan of care

## 2019-01-19 NOTE — Progress Notes (Signed)
[name] PHYSICAL MEDICINE & REHABILITATION PROGRESS NOTE   Subjective/Complaints: Pt up in bed. Eating breakfast vigorously. Complaining about having to urinate a lot.   ROS: Limited due to cognitive/behavioral   Objective:   No results found. Recent Labs    01/18/19 0634 01/19/19 0539  WBC 8.2 8.6  HGB 8.3* 8.3*  HCT 25.9* 25.7*  PLT 161 182   Recent Labs    01/18/19 0634 01/19/19 0539  NA 134* 137  K 4.8 4.1  CL 99 101  CO2 27 26  GLUCOSE 113* 104*  BUN 57* 46*  CREATININE 3.04* 2.17*  CALCIUM 8.4* 8.5*    Intake/Output Summary (Last 24 hours) at 01/19/2019 0827 Last data filed at 01/19/2019 0600 Gross per 24 hour  Intake 550 ml  Output 1 ml  Net 549 ml     Physical Exam: Vital Signs Blood pressure (!) 145/48, pulse 70, temperature (!) 97.4 F (36.3 C), temperature source Oral, resp. rate 16, height 4\' 9"  (1.448 m), weight 68.7 kg, SpO2 98 %. Constitutional: No distress . Vital signs reviewed. HEENT: EOMI, oral membranes moist Neck: supple Cardiovascular: RRR without murmur. No JVD    Respiratory: CTA Bilaterally without wheezes or rales. Normal effort    GI: BS +, non-tender, non-distended  Musculoskeletal:  General: Edema(1+ pitting edema bilateral shins. )present.  Neuro: more alert, oriented to person and place.  Follows commands. Normal language.   Moves all 4's 4- to 4/5, no sensory abnl Psych: cooperative   Assessment/Plan: 1. 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  Physiatrist is providing close team supervision and 24 hour management of active medical problems listed below.  Physiatrist and rehab team continue to assess barriers to discharge/monitor patient progress toward functional and medical goals  Care Tool:  Bathing  Bathing activity did not occur: Refused           Bathing assist        Upper Body Dressing/Undressing Upper body dressing   What is the patient wearing?: Hospital gown only    Upper body assist Assist Level: Set up assist    Lower Body Dressing/Undressing Lower body dressing            Lower body assist       Toileting Toileting    Toileting assist Assist for toileting: Contact Guard/Touching assist     Transfers Chair/bed transfer  Transfers assist     Chair/bed transfer assist level: Contact Guard/Touching assist     Locomotion Ambulation   Ambulation assist      Assist level: Minimal Assistance - Patient > 75% Assistive device: Walker-rolling Max distance: 150'   Walk 10 feet activity   Assist     Assist level: Minimal Assistance - Patient > 75% Assistive device: Walker-rolling   Walk 50 feet activity   Assist    Assist level: Minimal Assistance - Patient > 75% Assistive device: Walker-rolling    Walk 150 feet activity   Assist Walk 150 feet activity did not occur: Safety/medical concerns  Assist level: Minimal Assistance - Patient > 75% Assistive device: Walker-rolling    Walk 10 feet on uneven surface  activity   Assist Walk 10 feet on uneven surfaces activity did not occur: Safety/medical concerns         Wheelchair     Assist Will patient use wheelchair at discharge?: (TBD) Type of Wheelchair: Agricultural engineer  assist level: Supervision/Verbal cueing Max wheelchair distance: 33'    Wheelchair 50 feet with 2 turns activity    Assist        Assist Level: Supervision/Verbal cueing   Wheelchair 150 feet activity     Assist Wheelchair 150 feet activity did not occur: Safety/medical concerns        Medical Problem List and Plan: 1.Functional deficitssecondary to 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 DVT Prophylaxis/Anticoagulation: Mechanical:Sequential compression devices, below  kneeBilateral lower extremities 3.Chronic LBP/Pain Management:Used hydrocodone and gabapentin at home. 4. Mood:LCSW to follow for evaluation and support. 5. Neuropsych: This patientIs not fullycapable of making decisions on herown behalf. 6. Skin/Wound Care:Routine pressure relief measures 7. Fluids/Electrolytes/Nutrition:BUN and creatinine improved today. Labs reviewed  -dc IVF given hx of CHF  -repeat BMET tomorrow  -Lasix held 8. T2DM: Continue to glucotrol but hold metformin due to rising creatinine.. Will monitor BS ac/hs.  9. COPD:Add flutter valve.Encourage ISand weanoxygen to off.Asking for her inhaler--will add nebs prn for now. 10. Chronic diastolic CHF:     ASA, Plavix, coreg--  changed Zocor to Lipitor to avoid interaction with amlodipine.  -need daily weights Filed Weights   01/16/19 1833 01/18/19 0928 01/19/19 0617  Weight: 67 kg 64.8 kg 68.7 kg   11. Acute on CKD:  .   - Held Lasix, see above 12. HTN: Monitor BP bid. Norvasc added on 1/31 for better control.    13. Glaucoma: Stable - continue Timoptic and Xalatan.  54.SFKCL metabolic encephalopathy:Improving, likelydue to worsening of CKD as well as gabapentin.  15. Anemia of chronic disease: Continue iron supplement every other day.   -Hemoglobin slightly stable at 8.3 today  Continue to monitor.   -stool sample not collected yet 6. Leucocytosis: Monitor temps, incision for healing and for other signs of infection.   -WBCs are 8.6 today  LOS: 3 days A FACE TO FACE EVALUATION WAS PERFORMED  Meredith Staggers 01/19/2019, 8:27 AM

## 2019-01-19 NOTE — Progress Notes (Addendum)
Physical Therapy Session Note  Patient Details  Name: [name] [name] MRN: [id number] Date of Birth: October 26, 1946  Today's Date: 01/19/2019 PT Individual Time: 1300-1415 PT Individual Time Calculation (min): 75 min   Short Term Goals: Week 1:  PT Short Term Goal 1 (Week 1): =LTG due to ELOS  Skilled Therapeutic Interventions/Progress Updates:    Patient in supine reports did not sleep well thinking she had returned to [facility] and getting confused overnight.  Reports NT stayed with her, but not feeling well today.  Patient performed bed exercises consisting of ankle AROM, heel slides, leg lifts, hip abduction/adduction and SLR.  Supine to sit with rail and HOB up with S.  Sit to stand minguard Aand holding walker performed heel raises, marching in place and then sat in w/c.  Assisted in w/c to gym for work on balance.  Sit to stand minguard A and stood no UE support for 2 minutes increasd A-P sway and cues for posture and minguard to S assist.  Patient ambulated with RW 12' with min A noting walker too tall and cues for posture, hips forward and proximity to walker.  Adjusted walker down to pt's height and gait x 76' with improved posture, positioning and cues for less UE support.  Patient requesting to toilet so in w/c assist to room to toilet gait with RW and min A and performed hygiene on her own.  Patient in gym to perform balance in parallel bars for side stepping, forward tandem with UE support.  Seated LAQ x 15 w/1#; standing hip abduction and extension in bars.  Pt standing on foam without UE support noted posterior bias and cues for coming up off of posterior support.  Gait x 160' with RW and min A w/c close, but made it to room and to sit on EOB, sit to supine S and assisted for positioning left with bed alarm on and needs in reach.  Therapy Documentation Precautions:  Precautions Precautions: Fall Restrictions Weight Bearing Restrictions: No Pain: Pain Assessment Pain Score: 0-No  pain    Therapy/Group: Individual Therapy  Reginia Naas  [place], [place] 01/19/2019  01/19/2019, 5:17 PM

## 2019-01-19 NOTE — Progress Notes (Signed)
Occupational Therapy Session Note  Patient Details  Name: IQRA ROTUNDO MRN: [id number] Date of Birth: Sep 26, 1946  Today's Date: 01/19/2019 OT Individual Time: 1030-1130 OT Individual Time Calculation (min): 60 min    Short Term Goals: Week 1:  OT Short Term Goal 1 (Week 1): STGs=LTGs secondary to estimated short LOS  Skilled Therapeutic Interventions/Progress Updates:    Pt received in bed lethargic and stating she could not do therapy bc she was not feeling well and most likely would vomit it she tried to move.   Encouraged pt to try and with lots of coaxing, she sat to EOB, stood and transferred with RW with S.  Pt discussing her medical history accurately, where she was, recalled information she had just learned.   With coaxing, pt was able to bathe self, donn a dress, apply lotion to face and arms. Pt stood up to sink with S.    Pt needed to move slowly with lots of rest breaks but was able to participate without getting ill.  Pt in room with chair belt alarm on and all needs met.   Therapy Documentation Precautions:  Precautions Precautions: Fall Restrictions Weight Bearing Restrictions: No    Vital Signs: Therapy Vitals Pulse Rate: 70 BP: (!) 145/48 Oxygen Therapy SpO2: 98 % Pain: Pain Assessment Pain Scale: 0-10 Pain Score: 0-No pain      Therapy/Group: Individual Therapy  [place] 01/19/2019, 12:10 PM

## 2019-01-20 ENCOUNTER — Inpatient Hospital Stay (HOSPITAL_COMMUNITY): Payer: Self-pay | Admitting: Physical Therapy

## 2019-01-20 ENCOUNTER — Inpatient Hospital Stay (HOSPITAL_COMMUNITY): Payer: Self-pay | Admitting: Speech Pathology

## 2019-01-20 ENCOUNTER — Inpatient Hospital Stay (HOSPITAL_COMMUNITY): Payer: Self-pay | Admitting: Occupational Therapy

## 2019-01-20 LAB — CBC
HCT: 31.4 % — ABNORMAL LOW (ref 36.0–46.0)
HEMOGLOBIN: 10.3 g/dL — AB (ref 12.0–15.0)
MCH: 28.5 pg (ref 26.0–34.0)
MCHC: 32.8 g/dL (ref 30.0–36.0)
MCV: 87 fL (ref 80.0–100.0)
Platelets: 244 10*3/uL (ref 150–400)
RBC: 3.61 MIL/uL — ABNORMAL LOW (ref 3.87–5.11)
RDW: 14.7 % (ref 11.5–15.5)
WBC: 12.6 10*3/uL — ABNORMAL HIGH (ref 4.0–10.5)
nRBC: 0 % (ref 0.0–0.2)

## 2019-01-20 LAB — URINALYSIS, ROUTINE W REFLEX MICROSCOPIC
BILIRUBIN URINE: NEGATIVE
Bacteria, UA: NONE SEEN
Glucose, UA: 50 mg/dL — AB
Hgb urine dipstick: NEGATIVE
Ketones, ur: NEGATIVE mg/dL
Leukocytes, UA: NEGATIVE
Nitrite: NEGATIVE
Protein, ur: 100 mg/dL — AB
Specific Gravity, Urine: 1.013 (ref 1.005–1.030)
pH: 7 (ref 5.0–8.0)

## 2019-01-20 LAB — BASIC METABOLIC PANEL
Anion gap: 15 (ref 5–15)
BUN: 32 mg/dL — AB (ref 8–23)
CO2: 25 mmol/L (ref 22–32)
Calcium: 9.3 mg/dL (ref 8.9–10.3)
Chloride: 100 mmol/L (ref 98–111)
Creatinine, Ser: 1.74 mg/dL — ABNORMAL HIGH (ref 0.44–1.00)
GFR calc Af Amer: 33 mL/min — ABNORMAL LOW (ref >60)
GFR calc non Af Amer: 29 mL/min — ABNORMAL LOW (ref >60)
Glucose, Bld: 206 mg/dL — ABNORMAL HIGH (ref 70–99)
POTASSIUM: 3.8 mmol/L (ref 3.5–5.1)
Sodium: 140 mmol/L (ref 135–145)

## 2019-01-20 LAB — GLUCOSE, CAPILLARY
Glucose-Capillary: 186 mg/dL — ABNORMAL HIGH (ref 70–99)
Glucose-Capillary: 184 mg/dL — ABNORMAL HIGH (ref 70–99)
Glucose-Capillary: 119 mg/dL — ABNORMAL HIGH (ref 70–99)
Glucose-Capillary: 189 mg/dL — ABNORMAL HIGH (ref 70–99)

## 2019-01-20 NOTE — Progress Notes (Signed)
Physical Therapy Note  Patient Details  Name: [name] [name] MRN: [id number] Date of Birth: Feb 21, 1946 Today's Date: 01/20/2019   Attempted to see patient for scheduled therapy session. Pt asleep in bed and appears very lethargic. Pt encouraged to wake up and participate in therapy session. Pt declines any OOB mobility at this time due to fatigue. Encouraged pt to participate in bed level therex, pt declines. Returned 30 min later in attempt to have pt get up to w/c for lunch, pt declines due to fatigue. Pt missed 60 min of scheduled skilled therapy services due to fatigue and refusal to participate. Will follow up per POC.    Excell Seltzer, PT, DPT  01/20/2019, 11:58 AM

## 2019-01-20 NOTE — Progress Notes (Signed)
Occupational Therapy Session Note  Patient Details  Name: [name] [name] MRN: [id number] Date of Birth: 05-13-1946  Today's Date: 01/20/2019 OT Individual Time: 6294-7654 OT Individual Time Calculation (min): 40 min   Short Term Goals: Week 1:  OT Short Term Goal 1 (Week 1): STGs=LTGs secondary to estimated short LOS  Skilled Therapeutic Interventions/Progress Updates:    Pt greeted in w/c, asleep and difficult to rouse. She required encouragement to participate. Pt c/o being cold, so OT retrieved paper scrubs from linen closet, as there were no clean clothes in room. She was agreeable to get dressed, and did so sit<stand with RW and Min A for standing balance. Pt unable to achieve figure 4 bilaterally by herself. She required assist for placing limbs in this position while threading LEs into pants and also for donning gripper socks. Pt completed ambulatory transfer with RW and Min A to bathroom to attempt voiding bladder. However, pt unable to with increased time. Once back in w/c, pt applied lotion to legs and arms with encouragement, however refused to complete any other grooming tasks. She completed ambulatory transfer back to bed in manner as written above and returned to supine. Pt left with all needs within reach at end of tx, bed alarm set.   Therapy Documentation Precautions:  Precautions Precautions: Fall Restrictions Weight Bearing Restrictions: No Pain: No c/o pain. Per pt, "just cold."  Pain Assessment Pain Scale: 0-10 Pain Score: 0-No pain ADL:       Therapy/Group: Individual Therapy  Katalyna Socarras A Kennya Schwenn 01/20/2019, 12:20 PM

## 2019-01-20 NOTE — Progress Notes (Addendum)
[place] PHYSICAL MEDICINE & REHABILITATION PROGRESS NOTE   Subjective/Complaints: Up eating breakfast. Increased lethargy reported yesterday. Up and alert in chair this morning.   ROS: Patient denies fever, rash, sore throat, blurred vision, nausea, vomiting, diarrhea, cough, shortness of breath or chest pain,   or mood change.    Objective:   No results found. Recent Labs    01/19/19 0539 01/20/19 0613  WBC 8.6 12.6*  HGB 8.3* 10.3*  HCT 25.7* 31.4*  PLT 182 244   Recent Labs    01/19/19 0539 01/20/19 0613  NA 137 140  K 4.1 3.8  CL 101 100  CO2 26 25  GLUCOSE 104* 206*  BUN 46* 32*  CREATININE 2.17* 1.74*  CALCIUM 8.5* 9.3    Intake/Output Summary (Last 24 hours) at 01/20/2019 0958 Last data filed at 01/20/2019 7564 Gross per 24 hour  Intake 360 ml  Output -  Net 360 ml     Physical Exam: Vital Signs Blood pressure (!) 163/64, pulse 83, temperature 98 F (36.7 C), resp. rate 18, height 4\' 9"  (1.448 m), weight 68.7 kg, SpO2 93 %. Constitutional: No distress . Vital signs reviewed. HEENT: EOMI, oral membranes moist Neck: supple Cardiovascular: RRR without murmur. No JVD    Respiratory: CTA Bilaterally without wheezes or rales. Normal effort    GI: BS +, non-tender, non-distended  Musculoskeletal:  General: Edematr lower ext.  Neuro: more alert, oriented to person and place.  Follows commands. Normal language.   Moves all 4's 4- to 4/5, no sensory abnl Psych: cooperative   Assessment/Plan: 1. 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  Physiatrist is providing close team supervision and 24 hour management of active medical problems listed below.  Physiatrist and rehab team continue to assess barriers to discharge/monitor patient progress toward functional and medical goals  Care Tool:  Bathing  Bathing activity did not  occur: Refused           Bathing assist       Upper Body Dressing/Undressing Upper body dressing   What is the patient wearing?: Pull over shirt    Upper body assist Assist Level: Supervision/Verbal cueing    Lower Body Dressing/Undressing Lower body dressing      What is the patient wearing?: Incontinence brief, Pants     Lower body assist Assist for lower body dressing: Maximal Assistance - Patient 25 - 49%     Toileting Toileting    Toileting assist Assist for toileting: Contact Guard/Touching assist     Transfers Chair/bed transfer  Transfers assist     Chair/bed transfer assist level: Contact Guard/Touching assist     Locomotion Ambulation   Ambulation assist      Assist level: Minimal Assistance - Patient > 75% Assistive device: Walker-rolling Max distance: 150'   Walk 10 feet activity   Assist     Assist level: Minimal Assistance - Patient > 75% Assistive device: Walker-rolling   Walk 50 feet activity   Assist    Assist level: Minimal Assistance - Patient > 75% Assistive device: Walker-rolling    Walk 150 feet activity   Assist Walk 150 feet activity did not occur: Safety/medical concerns  Assist level: Minimal Assistance - Patient > 75% Assistive device: Walker-rolling    Walk 10 feet on uneven surface  activity   Assist Walk 10 feet on uneven surfaces activity did not occur: Safety/medical concerns  Wheelchair     Assist Will patient use wheelchair at discharge?: (TBD) Type of Wheelchair: Manual    Wheelchair assist level: Dependent - Patient 0% Max wheelchair distance: 65'    Wheelchair 50 feet with 2 turns activity    Assist        Assist Level: Dependent - Patient 0%   Wheelchair 150 feet activity     Assist Wheelchair 150 feet activity did not occur: Safety/medical concerns   Assist Level: Dependent - Patient 0%    Medical Problem List and Plan: 1.Functional  deficitssecondary to 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 DVT Prophylaxis/Anticoagulation: Mechanical:Sequential compression devices, below kneeBilateral lower extremities 3.Chronic LBP/Pain Management:Used hydrocodone and gabapentin at home. 4. Mood:LCSW to follow for evaluation and support. 5. Neuropsych: This patientIs not fullycapable of making decisions on herown behalf. 6. Skin/Wound Care:Routine pressure relief measures 7. Fluids/Electrolytes/Nutrition:BUN and creatinine continue to improve  -encourage PO. No further IVF  -repeat BMET tomorrow  -Lasix on hold 8. T2DM: Continue to glucotrol but hold metformin due to rising creatinine.. Will monitor BS ac/hs.  9. COPD:Add flutter valve.Encourage ISand weanoxygen to off.Asking for her inhaler--will add nebs prn for now. 10. Chronic diastolic CHF:     ASA, Plavix, coreg--  changed Zocor to Lipitor to avoid interaction with amlodipine.  -daily weights--inconsistent last 3-4d, continue to follow for trend Filed Weights   01/16/19 1833 01/18/19 0928 01/19/19 0617  Weight: 67 kg 64.8 kg 68.7 kg   11. Acute on CKD:  .   -BUN/Cr 32/1.74 2/7  - Held Lasix, see above 12. HTN: Monitor BP bid. Norvasc added on 1/31 for better control.    13. Glaucoma: Stable - continue Timoptic and Xalatan.  67.YPPJK metabolic encephalopathy:related to Acute on Chronic KD  -medication related---almost all neuro-sedating meds stopped  -check UA, UCX with elevated WBC's today 15. Anemia of chronic disease: Continue iron supplement every other day.   -Hemoglobin up to 10.3  Continue to monitor.   -stool sample not collected yet 16. Leukocytosis: Monitor temps, incision for healing and for other signs of infection.   -WBCs are 8.6 2/7  -check UA,UCX  -f/u CBC tomorrow LOS: 4 days A FACE TO [place] 01/20/2019, 9:58  AM

## 2019-01-20 NOTE — Progress Notes (Signed)
Pt seems to be increasingly confused during the night time hours. RN talked with PA earlier in the day. Daughter reports that patient has frequent bouts of confusion prior to admission as well. Pt has had an increase in urgency with bladder but reports no pain. Urinalysis collected. Pt has been sleepy today and has asked to go back to bed frequently as well.

## 2019-01-20 NOTE — Progress Notes (Signed)
Speech Language Pathology Daily Session Note  Patient Details  Name: [name] [name] MRN: [id number] Date of Birth: 1946/06/12  Today's Date: 01/20/2019   Skilled treatment session #1 SLP Individual Time: 0730-0830 SLP Individual Time Calculation (min): 60 min   Skilled treatment session #2 SLP Individual Time: 1030-1100 SLP Individual Time Calculation (min): 30    Short Term Goals: Week 1: SLP Short Term Goal 1 (Week 1): STG=LTG due to short ELOS  Skilled Therapeutic Interventions:  Skilled treatment session #1 focused on cognition goals. SLP facilitated session by providing Max A for sustained attention to self-feeding warm hot chocolate. Pt wrapped in blankets and perseverative on being cold (hince reason for hot chocolate). As a result, pt not willing to remove hands from blankets. With supervision cues, pt is oriented to self, month, place and situation. Pt with frequent tangential conversation that is disorganized. Pt left upright in wheelchair, lap alarm on and all needs within reach. Nursing is aware that pt refused to consume any breakfast d/t being cold despite hot liquids and multiple blankets.   Of note, pt's nurse states that daughter was present yesterday evening and informed nurse that pt was frequently confused at baseline and nobody knew why. Daughter hopes that "we can figure it out."  Skilled treatment session #2 focused on cognition goals. SLP received pt asleep in bed. Pt required Max A to arouse and maintain alertness. Multiple varied tasks were presented but pt continues to state that she is cold and unable to uncover her hands. Pt continues to be oriented to month, location, and situation (CVA). SLP read choices of lunch items to pt and no indication from pt to request specific items. Pt was left supine in bed, bed alarm on and all needs within reach. Continue per current plan of care.      Pain Pain Assessment Pain Scale: 0-10 Pain Score: 0-No  pain  Therapy/Group: Individual Therapy  [name] [name] 01/20/2019, 9:30 AM

## 2019-01-21 ENCOUNTER — Inpatient Hospital Stay (HOSPITAL_COMMUNITY): Payer: Self-pay | Admitting: Physical Therapy

## 2019-01-21 ENCOUNTER — Inpatient Hospital Stay (HOSPITAL_COMMUNITY): Payer: Self-pay | Admitting: Occupational Therapy

## 2019-01-21 ENCOUNTER — Inpatient Hospital Stay (HOSPITAL_COMMUNITY): Payer: Self-pay | Admitting: Speech Pathology

## 2019-01-21 DIAGNOSIS — G9341 Metabolic encephalopathy: Secondary | ICD-10-CM

## 2019-01-21 DIAGNOSIS — I10 Essential (primary) hypertension: Secondary | ICD-10-CM

## 2019-01-21 DIAGNOSIS — I5032 Chronic diastolic (congestive) heart failure: Secondary | ICD-10-CM

## 2019-01-21 DIAGNOSIS — D72829 Elevated white blood cell count, unspecified: Secondary | ICD-10-CM

## 2019-01-21 DIAGNOSIS — E669 Obesity, unspecified: Secondary | ICD-10-CM

## 2019-01-21 DIAGNOSIS — N179 Acute kidney failure, unspecified: Secondary | ICD-10-CM

## 2019-01-21 DIAGNOSIS — E1169 Type 2 diabetes mellitus with other specified complication: Secondary | ICD-10-CM

## 2019-01-21 LAB — HEMOCCULT SLIDES (X 3 CARDS)

## 2019-01-21 LAB — GLUCOSE, CAPILLARY
GLUCOSE-CAPILLARY: 102 mg/dL — AB (ref 70–99)
Glucose-Capillary: 161 mg/dL — ABNORMAL HIGH (ref 70–99)
Glucose-Capillary: 210 mg/dL — ABNORMAL HIGH (ref 70–99)
Glucose-Capillary: 207 mg/dL — ABNORMAL HIGH (ref 70–99)

## 2019-01-21 LAB — CBC
HCT: 29.3 % — ABNORMAL LOW (ref 36.0–46.0)
HEMOGLOBIN: 9.8 g/dL — AB (ref 12.0–15.0)
MCH: 29.2 pg (ref 26.0–34.0)
MCHC: 33.4 g/dL (ref 30.0–36.0)
MCV: 87.2 fL (ref 80.0–100.0)
Platelets: 238 10*3/uL (ref 150–400)
RBC: 3.36 MIL/uL — ABNORMAL LOW (ref 3.87–5.11)
RDW: 14.9 % (ref 11.5–15.5)
WBC: 11.6 10*3/uL — ABNORMAL HIGH (ref 4.0–10.5)
nRBC: 0.2 % (ref 0.0–0.2)

## 2019-01-21 LAB — BASIC METABOLIC PANEL
Anion gap: 15 (ref 5–15)
BUN: 23 mg/dL (ref 8–23)
CO2: 24 mmol/L (ref 22–32)
Calcium: 9.1 mg/dL (ref 8.9–10.3)
Chloride: 103 mmol/L (ref 98–111)
Creatinine, Ser: 1.52 mg/dL — ABNORMAL HIGH (ref 0.44–1.00)
GFR calc Af Amer: 39 mL/min — ABNORMAL LOW (ref >60)
GFR calc non Af Amer: 34 mL/min — ABNORMAL LOW (ref >60)
Glucose, Bld: 166 mg/dL — ABNORMAL HIGH (ref 70–99)
Potassium: 3.4 mmol/L — ABNORMAL LOW (ref 3.5–5.1)
SODIUM: 142 mmol/L (ref 135–145)

## 2019-01-21 LAB — URINE CULTURE: Culture: <10000 — AB

## 2019-01-21 LAB — OCCULT BLOOD X 1 CARD TO LAB, STOOL: FECAL OCCULT BLD: NEGATIVE

## 2019-01-21 NOTE — Progress Notes (Signed)
Physical Therapy Session Note  Patient Details  Name: [name] [name] MRN: [id number] Date of Birth: [birth year]  Today's Date: 01/21/2019 PT Individual Time: 1140-1205; 1400-1430 PT Individual Time Calculation (min): 25 min and 30 min  Short Term Goals: Week 1:  PT Short Term Goal 1 (Week 1): =LTG due to ELOS  Skilled Therapeutic Interventions/Progress Updates:    Session 1: Pt received seated in bed. Pt initially disagreeable to participate in therapy session due to fatigue and wanting to stay asleep. Therapist returns after 30 min and pt is finishing toileting on bedside commode with NT. Pt is CGA for sit to stand to RW. Stand pivot transfer commode to bed to w/c with CGA and RW. Adjusted w/c leg rests for improved fit. Pt agreeable to stay seated in w/c for lunch. Pt left seated in w/c in room with needs in reach, quick release belt and chair alarm in place. No complaints of pain.  Session 2: Pt received seated in w/c in room, agreeable with encouragement to participate in session. No complaints of pain. Education with patient about importance of time spent OOB and importance of participation in therapy sessions in order to d/c home next week. Pt receptive to education. Sit to stand with CGA to RW throughout session. Ambulation 2 x 50 ft with RW and min A for balance. Pt agreeable to stay seated in w/c in room for next session with SLP. Pt left seated in w/c in room with needs in reach, quick release belt and chair alarm inplace.  Therapy Documentation Precautions:  Precautions Precautions: Fall Restrictions Weight Bearing Restrictions: No Pain: Pain Assessment Pain Scale: 0-10 Pain Score: 0-No pain    Therapy/Group: Individual Therapy   Excell Seltzer, PT, DPT  01/21/2019, 12:53 PM

## 2019-01-21 NOTE — Progress Notes (Signed)
Speech Language Pathology Daily Session Note  Patient Details  Name: [name] [name] MRN: [id number] Date of Birth: [birth year]  Today's Date: 01/21/2019 SLP Individual Time: 0835-0900 SLP Individual Time Calculation (min): 25 min  Short Term Goals: Week 1: SLP Short Term Goal 1 (Week 1): STG=LTG due to short ELOS  Skilled Therapeutic Interventions:  Session 1:  Pt was seen for skilled ST targeting cognitive goals.  Pt was sleeping upon arrival but awakened to voice and light touch.  She repeatedly attempted to refuse therapy but was eventually receptive to sitting up in bed, brushing her teeth, and washing her face with max encouragement and explanation of rationale for participation in therapies.  Pt completed the aforementioned ADLs with supervision for task sequencing.  Pt was oriented to place and situation independently and was only off by one for day of the week when orienting to date.  Pt was more alert by the end of today's therapy session and was left in sitting upright in bed to eat breakfast (yogurt and bottled water).  Bed alarm was set and call bell was left within reach.  Continue per current plan of care.   Session 2:  Pt was seen for skilled ST targeting cognitive goals.  Pt was received sitting upright in wheelchair after just having completed PT session.  Pt was much brighter this afternoon in comparison to AM session.  SLP facilitated the session with a previously targeted card game to address recall of new information and functional problem solving.  Pt recalled 2 out of 3 task rules with mod I which improved to 3 out of 3 recall with supervision question cues.  Pt needed supervision cues to plan and execute a problem solving strategy within task and was able to alternate her attention between conversations with the therapist and the activity with supervision cues for redirection.  Pt was returned to bed and left with call bell within reach and bed alarm set.  Continue per current  plan of care.   Pain Pain Assessment Pain Scale: 0-10 Pain Score: 0-No pain  Therapy/Group: Individual Therapy  Carigan Lister, Selinda [place] 01/21/2019, 9:42 AM

## 2019-01-21 NOTE — Progress Notes (Signed)
[facility] PHYSICAL MEDICINE & REHABILITATION PROGRESS NOTE   Subjective/Complaints: Patient seen laying in bed this morning.  She states she did not sleep well overnight due to her SCDs and incontinence.  ROS: + Urinary incontinence, shortness of breath.  Denies CP.   Objective:   No results found. Recent Labs    01/20/19 0613 01/21/19 0525  WBC 12.6* 11.6*  HGB 10.3* 9.8*  HCT 31.4* 29.3*  PLT 244 238   Recent Labs    01/20/19 0613 01/21/19 0525  NA 140 142  K 3.8 3.4*  CL 100 103  CO2 25 24  GLUCOSE 206* 166*  BUN 32* 23  CREATININE 1.74* 1.52*  CALCIUM 9.3 9.1    Intake/Output Summary (Last 24 hours) at 01/21/2019 1021 Last data filed at 01/21/2019 0756 Gross per 24 hour  Intake 120 ml  Output 1 ml  Net 119 ml     Physical Exam: Vital Signs Blood pressure (!) 166/66, pulse 80, temperature 97.7 F (36.5 C), temperature source Oral, resp. rate 18, height 4\' 9"  (1.448 m), weight 68.7 kg, SpO2 95 %. Constitutional: No distress . Vital signs reviewed. HENT: Normocephalic.  Atraumatic. Eyes: EOMI. No discharge. Cardiovascular: RRR. No JVD. Respiratory: + Wheezes. Normal effort. GI: BS +. Non-distended. Musc: No edema or tenderness in extremities. Neuro: Alert and oriented Motor: Grossly 4-/5 throughout Psych: Slowed   Assessment/Plan: 1. 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  Physiatrist is providing close team supervision and 24 hour management of active medical problems listed below.  Physiatrist and rehab team continue to assess barriers to discharge/monitor patient progress toward functional and medical goals  Care Tool:  Bathing  Bathing activity did not occur: Refused           Bathing assist       Upper Body Dressing/Undressing Upper body dressing   What is the patient wearing?: Pull over shirt    Upper  body assist Assist Level: Supervision/Verbal cueing    Lower Body Dressing/Undressing Lower body dressing      What is the patient wearing?: Incontinence brief, Pants     Lower body assist Assist for lower body dressing: Maximal Assistance - Patient 25 - 49%     Toileting Toileting    Toileting assist Assist for toileting: Contact Guard/Touching assist     Transfers Chair/bed transfer  Transfers assist     Chair/bed transfer assist level: Contact Guard/Touching assist     Locomotion Ambulation   Ambulation assist      Assist level: Minimal Assistance - Patient > 75% Assistive device: Walker-rolling Max distance: 150'   Walk 10 feet activity   Assist     Assist level: Minimal Assistance - Patient > 75% Assistive device: Walker-rolling   Walk 50 feet activity   Assist    Assist level: Minimal Assistance - Patient > 75% Assistive device: Walker-rolling    Walk 150 feet activity   Assist Walk 150 feet activity did not occur: Safety/medical concerns  Assist level: Minimal Assistance - Patient > 75% Assistive device: Walker-rolling    Walk 10 feet on uneven surface  activity   Assist Walk 10 feet on uneven surfaces activity did not occur: Safety/medical concerns         Wheelchair     Assist Will patient use wheelchair at discharge?: (TBD) Type of Wheelchair: Manual    Wheelchair assist level: Dependent - Patient 0%  Max wheelchair distance: 24'    Wheelchair 50 feet with 2 turns activity    Assist        Assist Level: Dependent - Patient 0%   Wheelchair 150 feet activity     Assist Wheelchair 150 feet activity did not occur: Safety/medical concerns   Assist Level: Dependent - Patient 0%    Medical Problem List and Plan: 1.Functional deficitssecondary to 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 DVT Prophylaxis/Anticoagulation: Mechanical:Sequential compression devices, below kneeBilateral lower extremities 3.Chronic LBP/Pain Management:Used hydrocodone and gabapentin at home. 4. Mood:LCSW to follow for evaluation and support. 5. Neuropsych: This patientIs not fullycapable of making decisions on herown behalf. 6. Skin/Wound Care:Routine pressure relief measures 7. Fluids/Electrolytes/Nutrition:  -encourage PO. No further IVF 8. T2DM: Continue to glucotrol but hold metformin due to rising creatinine.. Will monitor BS ac/hs.   Elevated on 2/8 9. COPD:Add flutter valve.Encourage ISand weanoxygen to off.Asking for her inhaler--will add nebs prn for now. 10. Chronic diastolic CHF:     ASA, Plavix, coreg--  changed Zocor to Lipitor to avoid interaction with amlodipine.  -daily weights Filed Weights   01/16/19 1833 01/18/19 0928 01/19/19 0617  Weight: 67 kg 64.8 kg 68.7 kg   ?  Reliability 11. Acute on CKD:  .   -BUN/Cr 23/1.52 on 2/8  -Hold Lasix 12. HTN: Monitor BP bid. Norvasc added on 1/31.   Elevated on 2/8, will plan to resume Lasix therapy BUN/creatinine continue to improve  13. Glaucoma: Stable - continue Timoptic and Xalatan.  65.VVZSM metabolic encephalopathy:related to Acute on Chronic KD  -medication related---almost all neuro-sedating meds stopped 15. Anemia of chronic disease: Continue iron supplement every other day.   -Hemoglobin up to 10.3  Continue to monitor.   -stool sample not collected yet 16. Leukocytosis: Monitor temps, incision for healing and for other signs of infection.   -WBCs 11.6 on 2/8  Afebrile  -UA unremarkable, urine culture pending   Chest x-ray from 2/2 reviewed, unremarkable for infectious process, will consider repeat if necessary  LOS: 5 days A FACE TO FACE EVALUATION WAS PERFORMED  Ankit Lorie Phenix 01/21/2019, 10:21 AM

## 2019-01-21 NOTE — Progress Notes (Signed)
Occupational Therapy Session Note  Patient Details  Name: [name] [name] MRN: [id number] Date of Birth: February 22, 1946  Today's Date: 01/21/2019 OT Individual Time: 0815-0825 OT Individual Time Calculation (min): 10 min  and Today's Date: 01/21/2019 OT Missed Time: 65 Minutes Missed Time Reason: Patient fatigue;Patient unwilling/refused to participate without medical reason   Short Term Goals: Week 1:  OT Short Term Goal 1 (Week 1): STGs=LTGs secondary to estimated short LOS  Skilled Therapeutic Interventions/Progress Updates:    Upon entering the room, pt supine in bed sleeping soundly. OT unable to fully awaken pt. She blinked eyes once when therapist called her name but pt verbalized, " No" when she saw it was therapy. Pt very lethargic and declined OT intervention this session. Bed alarm activated and call bell within reach upon exiting the room.   Therapy Documentation Precautions:  Precautions Precautions: Fall Restrictions Weight Bearing Restrictions: No General: General OT Amount of Missed Time: 65 Minutes Vital Signs: Therapy Vitals Temp: 97.7 F (36.5 C) Temp Source: Oral Pulse Rate: 80 Resp: 18 BP: (!) 166/66 Patient Position (if appropriate): Lying Oxygen Therapy SpO2: 95 % O2 Device: Room Air   Therapy/Group: Individual Therapy  Gypsy Decant 01/21/2019, 8:45 AM

## 2019-01-22 ENCOUNTER — Inpatient Hospital Stay (HOSPITAL_COMMUNITY): Payer: Self-pay | Admitting: Occupational Therapy

## 2019-01-22 DIAGNOSIS — G9340 Encephalopathy, unspecified: Secondary | ICD-10-CM

## 2019-01-22 DIAGNOSIS — R0989 Other specified symptoms and signs involving the circulatory and respiratory systems: Secondary | ICD-10-CM

## 2019-01-22 DIAGNOSIS — E876 Hypokalemia: Secondary | ICD-10-CM

## 2019-01-22 LAB — GLUCOSE, CAPILLARY
Glucose-Capillary: 193 mg/dL — ABNORMAL HIGH (ref 70–99)
Glucose-Capillary: 346 mg/dL — ABNORMAL HIGH (ref 70–99)
Glucose-Capillary: 226 mg/dL — ABNORMAL HIGH (ref 70–99)
Glucose-Capillary: 159 mg/dL — ABNORMAL HIGH (ref 70–99)

## 2019-01-22 NOTE — Progress Notes (Signed)
[facility] PHYSICAL MEDICINE & REHABILITATION PROGRESS NOTE   Subjective/Complaints: Patient seen laying in bed this morning.  She states she slept fairly overnight due to the lights in her bed.  She states she is looking forward to her day of relative rest.  ROS: Denies CP, S OB, nausea, vomiting, diarrhea.   Objective:   No results found. Recent Labs    01/20/19 0613 01/21/19 0525  WBC 12.6* 11.6*  HGB 10.3* 9.8*  HCT 31.4* 29.3*  PLT 244 238   Recent Labs    01/20/19 0613 01/21/19 0525  NA 140 142  K 3.8 3.4*  CL 100 103  CO2 25 24  GLUCOSE 206* 166*  BUN 32* 23  CREATININE 1.74* 1.52*  CALCIUM 9.3 9.1    Intake/Output Summary (Last 24 hours) at 01/22/2019 2158 Last data filed at 01/22/2019 1802 Gross per 24 hour  Intake 840 ml  Output -  Net 840 ml     Physical Exam: Vital Signs Blood pressure 128/65, pulse 70, temperature 98.9 F (37.2 C), temperature source Oral, resp. rate 18, height 4\' 9"  (1.448 m), weight 62.6 kg, SpO2 97 %. Constitutional: No distress . Vital signs reviewed. HENT: Normocephalic.  Atraumatic. Eyes: EOMI. No discharge. Cardiovascular: RRR.  No JVD. Respiratory: + Wheezes.  Normal effort. GI: BS +. Non-distended. Musc: No edema or tenderness in extremities. Neuro: Alert and oriented Motor: Grossly 4-/5 throughout, unchanged  Psych: Slowed   Assessment/Plan: 1. 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  Physiatrist is providing close team supervision and 24 hour management of active medical problems listed below.  Physiatrist and rehab team continue to assess barriers to discharge/monitor patient progress toward functional and medical goals  Care Tool:  Bathing  Bathing activity did not occur: Refused           Bathing assist       Upper Body Dressing/Undressing Upper body dressing   What is  the patient wearing?: Pull over shirt    Upper body assist Assist Level: Supervision/Verbal cueing    Lower Body Dressing/Undressing Lower body dressing      What is the patient wearing?: Incontinence brief, Pants     Lower body assist Assist for lower body dressing: Maximal Assistance - Patient 25 - 49%     Toileting Toileting    Toileting assist Assist for toileting: Contact Guard/Touching assist     Transfers Chair/bed transfer  Transfers assist     Chair/bed transfer assist level: Contact Guard/Touching assist     Locomotion Ambulation   Ambulation assist      Assist level: Minimal Assistance - Patient > 75% Assistive device: Walker-rolling Max distance: 50'   Walk 10 feet activity   Assist     Assist level: Minimal Assistance - Patient > 75% Assistive device: Walker-rolling   Walk 50 feet activity   Assist    Assist level: Minimal Assistance - Patient > 75% Assistive device: Walker-rolling    Walk 150 feet activity   Assist Walk 150 feet activity did not occur: Safety/medical concerns  Assist level: Minimal Assistance - Patient > 75% Assistive device: Walker-rolling    Walk 10 feet on uneven surface  activity   Assist Walk 10 feet on uneven surfaces activity did not occur: Safety/medical concerns         Wheelchair     Assist Will patient use wheelchair at discharge?: (TBD) Type of  Wheelchair: Agricultural engineer assist level: Dependent - Patient 0% Max wheelchair distance: 6'    Wheelchair 50 feet with 2 turns activity    Assist        Assist Level: Dependent - Patient 0%   Wheelchair 150 feet activity     Assist Wheelchair 150 feet activity did not occur: Safety/medical concerns   Assist Level: Dependent - Patient 0%    Medical Problem List and Plan: 1.Functional deficitssecondary to 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 DVT  Prophylaxis/Anticoagulation: Mechanical:Sequential compression devices, below kneeBilateral lower extremities 3.Chronic LBP/Pain Management:Used hydrocodone and gabapentin at home. 4. Mood:LCSW to follow for evaluation and support. 5. Neuropsych: This patientIs not fullycapable of making decisions on herown behalf. 6. Skin/Wound Care:Routine pressure relief measures 7. Fluids/Electrolytes/Nutrition:  -encourage PO. No further IVF 8. T2DM: Continue to glucotrol but hold metformin due to rising creatinine.. Will monitor BS ac/hs.   Labile on 2/9 9. COPD:Add flutter valve.Encourage ISand weanoxygen to off.Asking for her inhaler--will add nebs prn for now. 10. Chronic diastolic CHF:     ASA, Plavix, coreg--  changed Zocor to Lipitor to avoid interaction with amlodipine.  -daily weights Filed Weights   01/19/19 0617 01/21/19 1600 01/22/19 0426  Weight: 68.7 kg 68.8 kg 62.6 kg   ?  Reliability 11. Acute on CKD:  .   -BUN/Cr 23/1.52 on 2/8  -Hold Lasix 12. HTN: Monitor BP bid. Norvasc added on 1/31.   Labile on 2/9, will plan to resume Lasix therapy BUN/creatinine continue to improve  13. Glaucoma: Stable - continue Timoptic and Xalatan.  29.JMEQA metabolic encephalopathy:related to Acute on Chronic KD  -almost all neuro-sedating meds stopped 15. Anemia of chronic disease: Continue iron supplement every other day.   -Hemoglobin up to 10.3  Continue to monitor.   -stool sample not collected yet 16. Leukocytosis: Monitor temps, incision for healing and for other signs of infection.   -WBCs 11.6 on 2/8  Afebrile  -UA unremarkable, urine culture <10,000 organisms  Chest x-ray from 2/2 reviewed, unremarkable for infectious process, will consider repeat if necessary 17.  Hypokalemia  Supplement initiated on 2/9  LOS: 6 days A FACE TO FACE EVALUATION WAS PERFORMED  Jenniefer Salak Lorie Phenix 01/22/2019, 9:58 PM

## 2019-01-22 NOTE — Progress Notes (Signed)
Occupational Therapy Session Note  Patient Details  Name: CORIN FORMISANO MRN: [id number] Date of Birth: Apr 30, 1946  Today's Date: 01/22/2019 OT Group Time: 1100-1200 OT Group Time Calculation (min): 60 min  Skilled Therapeutic Interventions/Progress Updates:    Pt engaged in therapeutic w/c level dance group focusing on patient choice, UE/LE strengthening, salience, activity tolerance, and social participation. Pt was guided through various dance-based exercises involving UEs/LEs and trunk. All music was selected by group members. Emphasis placed on activity tolerance, OOB tolerance, and NMR. Pt was alert, participatory, and socially engaged during session. She followed dance instructions unassisted, and took rest breaks when needed. Pt stood with OT during a [employer], swaying her hips and removing B UE support from chair (with steady assist). The group clapped and cheered for her, which appeared to brighten affect. At end of session pt was taken back to room by RT.    Therapy Documentation Precautions:  Precautions Precautions: Fall Restrictions Weight Bearing Restrictions: No Vital Signs: Therapy Vitals Pulse Rate: 74 Resp: 19 Oxygen Therapy SpO2: 97 % O2 Device: Room Air Pain: No s/s pain during tx    ADL:     Therapy/Group: Group Therapy  Albertha Beattie A Tyjai Charbonnet 01/22/2019, 12:47 PM

## 2019-01-23 ENCOUNTER — Encounter: Payer: Self-pay | Admitting: *Deleted

## 2019-01-23 ENCOUNTER — Inpatient Hospital Stay (HOSPITAL_COMMUNITY): Payer: Self-pay | Admitting: Occupational Therapy

## 2019-01-23 ENCOUNTER — Inpatient Hospital Stay (HOSPITAL_COMMUNITY): Payer: Self-pay | Admitting: Physical Therapy

## 2019-01-23 ENCOUNTER — Inpatient Hospital Stay (HOSPITAL_COMMUNITY): Payer: Self-pay

## 2019-01-23 LAB — BASIC METABOLIC PANEL
ANION GAP: 9 (ref 5–15)
BUN: 30 mg/dL — ABNORMAL HIGH (ref 8–23)
CO2: 25 mmol/L (ref 22–32)
Calcium: 8.4 mg/dL — ABNORMAL LOW (ref 8.9–10.3)
Chloride: 103 mmol/L (ref 98–111)
Creatinine, Ser: 1.65 mg/dL — ABNORMAL HIGH (ref 0.44–1.00)
GFR calc Af Amer: 35 mL/min — ABNORMAL LOW (ref >60)
GFR calc non Af Amer: 30 mL/min — ABNORMAL LOW (ref >60)
GLUCOSE: 171 mg/dL — AB (ref 70–99)
Potassium: 3.6 mmol/L (ref 3.5–5.1)
Sodium: 137 mmol/L (ref 135–145)

## 2019-01-23 LAB — GLUCOSE, CAPILLARY
Glucose-Capillary: 159 mg/dL — ABNORMAL HIGH (ref 70–99)
Glucose-Capillary: 270 mg/dL — ABNORMAL HIGH (ref 70–99)
Glucose-Capillary: 143 mg/dL — ABNORMAL HIGH (ref 70–99)
Glucose-Capillary: 168 mg/dL — ABNORMAL HIGH (ref 70–99)

## 2019-01-23 MED ORDER — SODIUM CHLORIDE 0.45 % IV SOLN
INTRAVENOUS | Status: DC
  Administered 2019-01-23: 18:00:00 via INTRAVENOUS
  Administered 2019-01-24: 1000 mL via INTRAVENOUS

## 2019-01-23 MED ORDER — GLIPIZIDE 5 MG PO TABS
10.0000 mg | ORAL_TABLET | Freq: Two times a day (BID) | ORAL | Status: DC
  Administered 2019-01-23 – 2019-01-25 (×5): 10 mg via ORAL
  Filled 2019-01-23 (×5): qty 2

## 2019-01-23 NOTE — Progress Notes (Signed)
Physical Therapy Session Note  Patient Details  Name: [name] [name] MRN: [id number] Date of Birth: [birth year]  Today's Date: 01/23/2019 PT Individual Time: 1005(make-up)-1105 PT Individual Time Calculation (min): 60 min   Short Term Goals: Week 1:  PT Short Term Goal 1 (Week 1): =LTG due to ELOS  Skilled Therapeutic Interventions/Progress Updates: Pt presented in bed sleeping but easily aroused and agreeable to therapy with encouragement. Pt performed bed mobility with supervision and performed STS with rollator and CGA. Pt ambulated to rehab gym with rollator CGA without breaks. Pt participated in [place] test with improved score of 33/56 which is  indicative of increased risk of falls. Participated in peg board on Airex for balance and standing tolerance. Pt required intermittent cues for increased forward wt shifting due to posterior lean. Pt ambulated to day room via rollator CGA and participated in NuStep L1 x 6 min for endurance and global strengthening. Pt indicated 5/10 on modified Borg. Pt ambulated back to room via rollator and CGA and returned to bed supervision. Pt left with bed alarm on, call bell within reach and current needs met.      Therapy Documentation Precautions:  Precautions Precautions: Fall Restrictions Weight Bearing Restrictions: No General:   Vital Signs:  Pain: Pain Assessment Pain Score: 0-No pain Mobility:   Locomotion :    Trunk/Postural Assessment :    Balance: Balance Balance Assessed: Yes Standardized Balance Assessment Standardized Balance Assessment: Berg Balance Test Berg Balance Test Sit to Stand: Able to stand without using hands and stabilize independently Standing Unsupported: Able to stand 30 seconds unsupported Sitting with Back Unsupported but Feet Supported on Floor or Stool: Able to sit safely and securely 2 minutes Stand to Sit: Controls descent by using hands Transfers: Able to transfer safely, definite need of  hands Standing Unsupported with Eyes Closed: Able to stand 10 seconds with supervision Standing Ubsupported with Feet Together: Able to place feet together independently and stand for 1 minute with supervision From Standing, Reach Forward with Outstretched Arm: Can reach forward >5 cm safely (2") From Standing Position, Pick up Object from Floor: Able to pick up shoe, needs supervision From Standing Position, Turn to Look Behind Over each Shoulder: Turn sideways only but maintains balance Turn 360 Degrees: Needs close supervision or verbal cueing Standing Unsupported, Alternately Place Feet on Step/Stool: Able to complete >2 steps/needs minimal assist Standing Unsupported, One Foot in Front: Able to take small step independently and hold 30 seconds Standing on One Leg: Unable to try or needs assist to prevent fall Total Score: 33 Exercises:   Other Treatments:      Therapy/Group: Individual Therapy  Samuele Storey  Garrin Kirwan, PTA  01/23/2019, 12:37 PM

## 2019-01-23 NOTE — Progress Notes (Signed)
[facility] PHYSICAL MEDICINE & REHABILITATION PROGRESS NOTE   Subjective/Complaints: Up in bed. Receiving breathing treatment. No new complaints. Anxious to get home  ROS: Patient denies fever, rash, sore throat, blurred vision, nausea, vomiting, diarrhea, cough, shortness of breath or chest pain, joint or back pain, headache, or mood change.   Objective:   No results found. Recent Labs    01/21/19 0525  WBC 11.6*  HGB 9.8*  HCT 29.3*  PLT 238   Recent Labs    01/21/19 0525 01/23/19 0454  NA 142 137  K 3.4* 3.6  CL 103 103  CO2 24 25  GLUCOSE 166* 171*  BUN 23 30*  CREATININE 1.52* 1.65*  CALCIUM 9.1 8.4*    Intake/Output Summary (Last 24 hours) at 01/23/2019 0906 Last data filed at 01/23/2019 0816 Gross per 24 hour  Intake 660 ml  Output -  Net 660 ml     Physical Exam: Vital Signs Blood pressure (!) 125/46, pulse 68, temperature 97.9 F (36.6 C), temperature source Oral, resp. rate 19, height 4\' 9"  (1.448 m), weight 63.5 kg, SpO2 96 %. Constitutional: No distress . Vital signs reviewed. HEENT: EOMI, oral membranes moist, voice stronger today Neck: supple Cardiovascular: RRR without murmur. No JVD    Respiratory: CTA Bilaterally with no wheezes or rales. Normal effort    GI: BS +, non-tender, non-distended  Musc: No edema or tenderness in extremities. Neuro: Alert and oriented. Quicker to respond/process,  Motor: Grossly 4-/5 throughout, unchanged  Psych: pleasant and cooperative   Assessment/Plan: 1. 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  Physiatrist is providing close team supervision and 24 hour management of active medical problems listed below.  Physiatrist and rehab team continue to assess barriers to discharge/monitor patient progress toward functional and medical goals  Care Tool:  Bathing  Bathing activity did  not occur: Refused           Bathing assist       Upper Body Dressing/Undressing Upper body dressing   What is the patient wearing?: Pull over shirt    Upper body assist Assist Level: Supervision/Verbal cueing    Lower Body Dressing/Undressing Lower body dressing      What is the patient wearing?: Incontinence brief, Pants     Lower body assist Assist for lower body dressing: Maximal Assistance - Patient 25 - 49%     Toileting Toileting    Toileting assist Assist for toileting: Contact Guard/Touching assist     Transfers Chair/bed transfer  Transfers assist     Chair/bed transfer assist level: Contact Guard/Touching assist     Locomotion Ambulation   Ambulation assist      Assist level: Minimal Assistance - Patient > 75% Assistive device: Walker-rolling Max distance: 50'   Walk 10 feet activity   Assist     Assist level: Minimal Assistance - Patient > 75% Assistive device: Walker-rolling   Walk 50 feet activity   Assist    Assist level: Minimal Assistance - Patient > 75% Assistive device: Walker-rolling    Walk 150 feet activity   Assist Walk 150 feet activity did not occur: Safety/medical concerns  Assist level: Minimal Assistance - Patient > 75% Assistive device: Walker-rolling    Walk 10 feet on uneven surface  activity   Assist Walk 10 feet on uneven surfaces activity did not occur: Safety/medical concerns         Wheelchair  Assist Will patient use wheelchair at discharge?: (TBD) Type of Wheelchair: Manual    Wheelchair assist level: Dependent - Patient 0% Max wheelchair distance: 22'    Wheelchair 50 feet with 2 turns activity    Assist        Assist Level: Dependent - Patient 0%   Wheelchair 150 feet activity     Assist Wheelchair 150 feet activity did not occur: Safety/medical concerns   Assist Level: Dependent - Patient 0%    Medical Problem List and Plan: 1.Functional  deficitssecondary to 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 DVT Prophylaxis/Anticoagulation: Mechanical:Sequential compression devices, below kneeBilateral lower extremities 3.Chronic LBP/Pain Management:Used hydrocodone and gabapentin at home. 4. Mood:LCSW to follow for evaluation and support. 5. Neuropsych: This patientIs not fullycapable of making decisions on herown behalf. 6. Skin/Wound Care:Routine pressure relief measures 7. Fluids/Electrolytes/Nutrition:  -encourage PO. No further IVF 8. T2DM: Continue to glucotrol but hold metformin due to rising creatinine.. Will monitor BS ac/hs.   suboptimal control   -increase glucotrol to 10mg   bid 9. COPD:Add flutter valve.Encourage ISand weanoxygen to off.Asking for her inhaler--will add nebs prn for now. 10. Chronic diastolic CHF:     ASA, Plavix, coreg--  changed Zocor to Lipitor to avoid interaction with amlodipine.  -daily weights Filed Weights   01/21/19 1600 01/22/19 0426 01/23/19 0438  Weight: 68.8 kg 62.6 kg 63.5 kg   Decreased last 2 days, need consistent readings 11. Acute on CKD:  .   -BUN/Cr 23/1.52 on 2/8---30/1.65 today  -resume HS IVF  -Held Lasix 12. HTN: Monitor BP bid. Norvasc added on 1/31.    -fair control 13. Glaucoma: Stable - continue Timoptic and Xalatan.  01.UUVOZ metabolic encephalopathy:related to Acute on Chronic KD  -almost all neuro-sedating meds stopped 15. Anemia of chronic disease: Continue iron supplement every other day.   -Hemoglobin up to 10.3  Continue to monitor.   -stool sample not collected yet 16. Leukocytosis:     -WBCs 11.6 on 2/8  Afebrile  -ucx, cxr negative  -repeat labs tomorrow 17.  Hypokalemia  Supplement initiated on 2/9  LOS: 7 days A FACE TO [place] 01/23/2019, 9:06 AM

## 2019-01-23 NOTE — Progress Notes (Addendum)
Occupational Therapy Session Note  Patient Details  Name: LANDREY MAHURIN MRN: [id number] Date of Birth: 1946/06/16  Today's Date: 01/23/2019 OT Individual Time: 1130-1155 OT Individual Time Calculation (min): 25 min    Short Term Goals: Week 1:  OT Short Term Goal 1 (Week 1): STGs=LTGs secondary to estimated short LOS     Skilled Therapeutic Interventions/Progress Updates:    Pt received in bed sleeping soundly.  She was able to wake up slowly with some prompting and then was able to sit to EOB with S.  She used rollator with close S to walk to bathroom, toilet and then stand at sink to wash hands all with S.  She sat in w/c and lunch arrived.  Pt opened containers and cut meat without difficulty. Provided pt with coffee.   Pt recalled what she did in earlier PT sessions but could not recall she had speech therapy even when cued. Pt oriented today.   Belt alarm on pt and pt finishing lunch with all needs met.   Therapy Documentation Precautions:  Precautions Precautions: Fall Restrictions Weight Bearing Restrictions: No   Pain: Pain Assessment Pain Score: 0-No pain   Therapy/Group: Individual Therapy  [place] 01/23/2019, 12:44 PM

## 2019-01-23 NOTE — Progress Notes (Signed)
Occupational Therapy Session Note  Patient Details  Name: [name] [name] MRN: [id number] Date of Birth: 04/30/1946  Today's Date: 01/23/2019 OT Individual Time: 1300-1357 OT Individual Time Calculation (min): 57 min    Short Term Goals: Week 1:  OT Short Term Goal 1 (Week 1): STGs=LTGs secondary to estimated short LOS  Skilled Therapeutic Interventions/Progress Updates:    Upon entering the room, pt seated in wheelchair with no c/o pain but reports fatigue. Pt was fully awake this session. Pt refused shower and changing clothing this session. OT reviewed goals and planned with pt to perform these tasks during tomorrows session. Pt agreeable to plan. Pt applying lotion to LEs with set up A to obtain needed items. Pt also packing items in room secondary to upcoming discharge with close supervision while in seated and with rollator. OT educated and demonstrated use of level 1 theraband for B UE strengthening exercises with focus on pursed lip breathing. Pt returning demonstrations with min cuing for proper technique. Pt performed 2 sets of chest pulls, bicep curls, shoulder elevation, and shoulder diagonals. Pt remained seated in wheelchair at end of session with all needs within reach.   Therapy Documentation Precautions:  Precautions Precautions: Fall Restrictions Weight Bearing Restrictions: No   Pain: Pain Assessment Pain Score: 0-No pain   Therapy/Group: Individual Therapy  Gypsy Decant 01/23/2019, 1:59 PM

## 2019-01-23 NOTE — Progress Notes (Signed)
Physical Therapy Session Note  Patient Details  Name: [name] [name] MRN: [id number] Date of Birth: 05-10-1946  Today's Date: 01/23/2019 PT Individual Time: 0800-0900 PT Individual Time Calculation (min): 60 min   Short Term Goals: Week 1:  PT Short Term Goal 1 (Week 1): =LTG due to ELOS  Skilled Therapeutic Interventions/Progress Updates:    Pt received supine in bed, agreeable to PT session with encouragement. No complaints of pain. Bed mobility Supervision. Sit to stand Supervision to RW. Toilet transfer with Supervision, assist to adjust brief as it is too large for patient. Ambulation x 150 ft with rollator and CGA for balance. Pt demos improved safety and balance with use of rollator this date, does require min cuing for safe brake management. Ascend/descend 8 4" steps with no handrails and min HHA to simulate home environment. Gait with rollator across carpeted surface with CGA to simulate home environment. Sit to stand from various height surfaces in simulate apartment from soft/pliable surfaces with Supervision to rollator. Per pt report she uses a stool to get up into her bed, demonstration of how to back up to stool then step up onto it for safe transfer into bed. Pt is able to perform this with CGA and use of rollator. Pt left seated in w/c in room with needs in reach, quick release belt and chair alarm in place at end of session.  Therapy Documentation Precautions:  Precautions Precautions: Fall Restrictions Weight Bearing Restrictions: No   Therapy/Group: Individual Therapy   Excell Seltzer, PT, DPT  01/23/2019, 12:53 PM

## 2019-01-23 NOTE — Progress Notes (Signed)
Speech Language Pathology Daily Session Note  Patient Details  Name: [name] [name] MRN: [id number] Date of Birth: 12/27/1945  Today's Date: 01/23/2019 SLP Individual Time: 0900-1000 SLP Individual Time Calculation (min): 60 min  Short Term Goals: Week 1: SLP Short Term Goal 1 (Week 1): STG=LTG due to short ELOS  Skilled Therapeutic Interventions:Skilled ST services focused on cognitive skills. Pt was agreeable to participate in ST services. SLP facilitated basic problem solving skills utilizing 3 and 4 step sequence picture cards, pt demonstrated mod I with 3 step cards and required supervision A verbal cues for 4 step cards. Pt demonstrated recall of 3 out 3 rules from pervious card task, Blink, when presented with cards. SLP facilitated mildly complex problem solving skills by recording appointments on calendar given verbal description and manipulating information, pt typically calls to arrange appointments and records on calendar, pt required min A verbal cues. Pt demonstrated sustained attention for 50 minutes with supervision A verbal cues for redirection. Pt requested to get in bed, SLP assisted with min A. Pt was left in room with call bell within reach and bed alarm set. Recommend to continue skilled ST services.     Pain Pain Assessment Pain Score: 0-No pain  Therapy/Group: Individual Therapy  [name]  [facility] 01/23/2019, 10:47 AM

## 2019-01-23 NOTE — Progress Notes (Signed)
This encounter was created in error - please disregard.

## 2019-01-24 ENCOUNTER — Inpatient Hospital Stay (HOSPITAL_COMMUNITY): Payer: Self-pay | Admitting: Speech Pathology

## 2019-01-24 ENCOUNTER — Inpatient Hospital Stay (HOSPITAL_COMMUNITY): Payer: Self-pay | Admitting: Physical Therapy

## 2019-01-24 ENCOUNTER — Inpatient Hospital Stay (HOSPITAL_COMMUNITY): Payer: Self-pay | Admitting: Occupational Therapy

## 2019-01-24 LAB — BASIC METABOLIC PANEL
Anion gap: 12 (ref 5–15)
BUN: 29 mg/dL — ABNORMAL HIGH (ref 8–23)
CHLORIDE: 102 mmol/L (ref 98–111)
CO2: 24 mmol/L (ref 22–32)
Calcium: 8.6 mg/dL — ABNORMAL LOW (ref 8.9–10.3)
Creatinine, Ser: 1.66 mg/dL — ABNORMAL HIGH (ref 0.44–1.00)
GFR calc Af Amer: 35 mL/min — ABNORMAL LOW (ref >60)
GFR calc non Af Amer: 30 mL/min — ABNORMAL LOW (ref >60)
GLUCOSE: 92 mg/dL (ref 70–99)
Potassium: 3.3 mmol/L — ABNORMAL LOW (ref 3.5–5.1)
Sodium: 138 mmol/L (ref 135–145)

## 2019-01-24 LAB — CBC
HEMATOCRIT: 27 % — AB (ref 36.0–46.0)
Hemoglobin: 8.7 g/dL — ABNORMAL LOW (ref 12.0–15.0)
MCH: 28.1 pg (ref 26.0–34.0)
MCHC: 32.2 g/dL (ref 30.0–36.0)
MCV: 87.1 fL (ref 80.0–100.0)
Platelets: 279 10*3/uL (ref 150–400)
RBC: 3.1 MIL/uL — ABNORMAL LOW (ref 3.87–5.11)
RDW: 14.7 % (ref 11.5–15.5)
WBC: 10.9 10*3/uL — ABNORMAL HIGH (ref 4.0–10.5)
nRBC: 0 % (ref 0.0–0.2)

## 2019-01-24 LAB — GLUCOSE, CAPILLARY
GLUCOSE-CAPILLARY: 306 mg/dL — AB (ref 70–99)
Glucose-Capillary: 87 mg/dL (ref 70–99)
Glucose-Capillary: 247 mg/dL — ABNORMAL HIGH (ref 70–99)
Glucose-Capillary: 94 mg/dL (ref 70–99)

## 2019-01-24 MED ORDER — POTASSIUM CHLORIDE CRYS ER 20 MEQ PO TBCR
20.0000 meq | EXTENDED_RELEASE_TABLET | Freq: Every day | ORAL | Status: DC
  Administered 2019-01-24 – 2019-01-25 (×2): 20 meq via ORAL
  Filled 2019-01-24 (×2): qty 1

## 2019-01-24 NOTE — Patient Care Conference (Signed)
Inpatient RehabilitationTeam Conference and Plan of Care Update Date: 01/24/2019   Time: 2:00 PM    Patient Name: [name] [name]      Medical Record Number: [id number]  Date of Birth: Jul 23, 1946 Sex: Female         Room/Bed: 4W23C/4W23C-01 Payor Info: Payor: [employer] MEDICARE / Plan: [facility] MEDICARE / Product Type: *No Product type* /    Admitting Diagnosis: Debility after tsvr new cva  Admit Date/Time:  01/16/2019  6:05 PM Admission Comments: No comment available   Primary Diagnosis:  <principal problem not specified> Principal Problem: <principal problem not specified>  Patient Active Problem List   Diagnosis Date Noted  . Hypokalemia   . Encephalopathy   . Labile blood pressure   . Leukocytosis   . Metabolic encephalopathy   . Chronic diastolic congestive heart failure ([place])   . Diabetes mellitus type 2 in obese ([place])   . Embolic stroke ([place]) 12/16/7251  . AKI (acute kidney injury) ([place]) 01/15/2019  . Drug toxicity 01/15/2019  . Intracranial carotid stenosis 01/15/2019  . 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 Severe aortic stenosis   . Rheumatoid arthritis ([place])   . Hypothyroidism   . Diabetes mellitus, type 2 ([place])   . Chronic diastolic CHF (congestive heart failure) ([place]) 08/18/2018  . CKD (chronic kidney disease) stage 3, GFR 30-59 ml/min ([facility]) 08/18/2018  . Frequent falls 02/02/2018  . History of gastric ulcer 10/16/2016  . Dysphagia 10/16/2016  . Spondylolisthesis of lumbar region 08/03/2014  . Essential hypertension 08/02/2014  . Hyperlipidemia 08/02/2014  . Primary osteoarthritis of both knees 06/14/2014  . Right leg weakness 10/31/2013  . H/O adenomatous polyp of colon 08/17/2012  . Family history of colon cancer 08/17/2012  . Chronic nausea 08/17/2012    Expected Discharge Date: Expected Discharge Date: 01/25/19  Team Members Present: Physician leading conference: Dr. Alger Simons Social Worker Present: Lennart Pall,  LCSW Nurse Present: Mohammed Kindle, LPN PT Present: Other (comment)(Taylor Ervin Knack, PT) OT Present: Cherylynn Ridges, OT SLP Present: Weston Anna, SLP PPS Coordinator present : Gunnar Fusi     Current Status/Progress Goal Weekly Team Focus  Medical   renal status, hydration better. re-started IVF. more alert. AMS likely due to meds and fluctuating renal status  improve cognition activity tolerance  see above   Bowel/Bladder   was incontinent of bladder last night, LBM 2/10  less episodes of incontinence  timed toileting   Swallow/Nutrition/ Hydration             ADL's   supervision  supervision  d/c planning, endurance, self care, balance, functional mobility   Mobility   Supervision transfers, S to CGA gait up to 150' with rollator, HHA stairs  Supervision overall  endurance, balance, d/c home this week   Communication             Safety/Cognition/ Behavioral Observations  Min A  Min A (met all 4 long term goals)  d/c tomorrow   Pain   no c/o pain, has tylenol prn  pain scale <4/10  assess & treat as needed   Skin   no skin break down noted  no new areas of skin break down  assess q shift    Rehab Goals Patient on target to meet rehab goals: Yes *See Care Plan and progress notes for long and short-term goals.     Barriers to Discharge  Current Status/Progress Possible Resolutions Date Resolved   Physician    Medical stability  ongoing medical mgt as detailed in chart      Nursing  Incontinence               PT                    OT                  SLP                SW                Discharge Planning/Teaching Needs:  Home with family able to provide 24/7 assistance.      Team Discussion:  No concerns.  Pt has met supervision goals and ready for d/c tomorrow with [place] follow up.  Revisions to Treatment Plan:  NA    Continued Need for Acute Rehabilitation Level of Care: The patient requires daily medical management by a physician with specialized  training in physical medicine and rehabilitation for the following conditions: Daily direction of a multidisciplinary physical rehabilitation program to ensure safe treatment while eliciting the highest outcome that is of practical value to the patient.: Yes Daily medical management of patient stability for increased activity during participation in an intensive rehabilitation regime.: Yes Daily analysis of laboratory values and/or radiology reports with any subsequent need for medication adjustment of medical intervention for : Cardiac problems;Pulmonary problems;Neurological problems   I attest that I was present, lead the team conference, and concur with the assessment and plan of the team.   [name] [name] 01/24/2019, 6:14 PM

## 2019-01-24 NOTE — Progress Notes (Signed)
Occupational Therapy Discharge Summary  Patient Details  Name: JOHNICA ARMWOOD MRN: [id number] Date of Birth: 1946/03/22  Today's Date: 01/24/2019 OT Individual Time: [phone] OT Individual Time Calculation (min): 59 min    Patient has met 11 of 11 long term goals due to improved activity tolerance, improved balance, ability to compensate for deficits and improved coordination.  Patient to discharge at overall Supervision level.No family has been present for hands on family training. However, pt reports family has been assisting with self care prior to admission.   Reasons goals not met: all goals met  Recommendation:  Patient will benefit from ongoing skilled OT services in home health setting to continue to advance functional skills in the area of BADL and iADL.  Equipment: No equipment provided  Reasons for discharge: treatment goals met  Patient/family agrees with progress made and goals achieved: Yes   OT intervention: Upon entering the room, pt supine in bed and reports not sleeping well. Pt with no c/o pain and agreeable to OT intervention with maximal encouragement. Pt declined shower and reports washing up prior to OT arrival due to incontinence. Pt ambulating with use of rollator at supervision level to obtain dressing gown. Pt sitting in rollator at sink for grooming tasks as well with supervision. OT reviewed recommendation and expectations for [facility] after discharge from the hospital. Pt asking appropriate questions. Pt transferred into wheelchair with call bell and all needed items within reach. No further questions at this time.     OT Discharge Precautions/Restrictions  Precautions Precautions: Fall Vital Signs Therapy Vitals Temp: 98.6 F (37 C) Pulse Rate: 88 Resp: 18 BP: (!) 146/79 Patient Position (if appropriate): Lying Oxygen Therapy O2 Device: Room Air Pain Pain Assessment Pain Scale: 0-10 Pain Score: 0-No pain Vision Baseline Vision/History:  Wears glasses;Glaucoma Wears Glasses: At all times Patient Visual Report: No change from baseline Cognition Arousal/Alertness: Awake/alert Orientation Level: Oriented X4 Sensation Sensation Light Touch: Appears Intact Proprioception: Appears Intact Coordination Gross Motor Movements are Fluid and Coordinated: No Fine Motor Movements are Fluid and Coordinated: Yes Coordination and Movement Description: impaired by generalized weakness Motor  Motor Motor: Other (comment) Motor - Skilled Clinical Observations: generalized weakness Mobility  Bed Mobility Bed Mobility: Rolling Right;Rolling Left;Supine to Sit;Sit to Supine Rolling Right: Supervision/verbal cueing Rolling Left: Supervision/Verbal cueing Supine to Sit: Supervision/Verbal cueing Sit to Supine: Supervision/Verbal cueing Transfers Sit to Stand: Supervision/Verbal cueing Stand to Sit: Supervision/Verbal cueing  Trunk/Postural Assessment  Cervical Assessment Cervical Assessment: Within Functional Limits Thoracic Assessment Thoracic Assessment: Exceptions to WFL(rounded shoulders\) Lumbar Assessment Lumbar Assessment: Exceptions to WFL(posterior pelvic tilt) Postural Control Postural Control: Within Functional Limits  Balance Balance Balance Assessed: Yes Static Sitting Balance Static Sitting - Balance Support: No upper extremity supported;Feet supported Static Sitting - Level of Assistance: 5: Stand by assistance Dynamic Sitting Balance Dynamic Sitting - Balance Support: No upper extremity supported;Feet supported;During functional activity Dynamic Sitting - Level of Assistance: 5: Stand by assistance Static Standing Balance Static Standing - Balance Support: Bilateral upper extremity supported;During functional activity Static Standing - Level of Assistance: 5: Stand by assistance Dynamic Standing Balance Dynamic Standing - Balance Support: Bilateral upper extremity supported;During functional activity Dynamic  Standing - Level of Assistance: 5: Stand by assistance Extremity/Trunk Assessment RUE Assessment RUE Assessment: Within Functional Limits LUE Assessment LUE Assessment: Within Functional Limits   Arrington Bencomo P, MS, OTR/L 01/24/2019, 8:47 AM

## 2019-01-24 NOTE — Progress Notes (Addendum)
[facility] PHYSICAL MEDICINE & REHABILITATION PROGRESS NOTE   Subjective/Complaints: No apparent issues overnight. Just waking up when I arrived today.   ROS: Patient denies fever, rash, sore throat, blurred vision, nausea, vomiting, diarrhea, cough, shortness of breath or chest pain, joint or back pain, headache, or mood change.   Objective:   No results found. Recent Labs    01/24/19 0520  WBC 10.9*  HGB 8.7*  HCT 27.0*  PLT 279   Recent Labs    01/23/19 0454 01/24/19 0520  NA 137 138  K 3.6 3.3*  CL 103 102  CO2 25 24  GLUCOSE 171* 92  BUN 30* 29*  CREATININE 1.65* 1.66*  CALCIUM 8.4* 8.6*    Intake/Output Summary (Last 24 hours) at 01/24/2019 0746 Last data filed at 01/23/2019 1819 Gross per 24 hour  Intake 400 ml  Output 1 ml  Net 399 ml     Physical Exam: Vital Signs Blood pressure (!) 146/79, pulse 88, temperature 98.6 F (37 C), resp. rate 18, height 4\' 9"  (1.448 m), weight 63.5 kg, SpO2 97 %. Constitutional: No distress . Vital signs reviewed. HEENT: EOMI, oral membranes moist Neck: supple Cardiovascular: RRR without murmur. No JVD    Respiratory: CTA Bilaterally without wheezes or rales. Normal effort    GI: BS +, non-tender, non-distended  Musc: No edema or tenderness in extremities. Neuro: arouses easily. Follows commands. Fair awareness,  Motor: Grossly 4-/5 throughout, unchanged  Psych: pleasant and cooperative   Assessment/Plan: 1. 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  Physiatrist is providing close team supervision and 24 hour management of active medical problems listed below.  Physiatrist and rehab team continue to assess barriers to discharge/monitor patient progress toward functional and medical goals  Care Tool:  Bathing  Bathing activity did not occur: Refused           Bathing assist        Upper Body Dressing/Undressing Upper body dressing   What is the patient wearing?: Pull over shirt    Upper body assist Assist Level: Supervision/Verbal cueing    Lower Body Dressing/Undressing Lower body dressing      What is the patient wearing?: Incontinence brief, Pants     Lower body assist Assist for lower body dressing: Maximal Assistance - Patient 25 - 49%     Toileting Toileting    Toileting assist Assist for toileting: Supervision/Verbal cueing     Transfers Chair/bed transfer  Transfers assist     Chair/bed transfer assist level: Contact Guard/Touching assist     Locomotion Ambulation   Ambulation assist      Assist level: Contact Guard/Touching assist Assistive device: Rollator Max distance: 150'   Walk 10 feet activity   Assist     Assist level: Contact Guard/Touching assist Assistive device: Rollator   Walk 50 feet activity   Assist    Assist level: Contact Guard/Touching assist Assistive device: Rollator    Walk 150 feet activity   Assist Walk 150 feet activity did not occur: Safety/medical concerns  Assist level: Contact Guard/Touching assist Assistive device: Rollator    Walk 10 feet on uneven surface  activity   Assist Walk 10 feet on uneven surfaces activity did not occur: Safety/medical concerns         Wheelchair     Assist Will patient use wheelchair at discharge?: (TBD) Type of Wheelchair: Agricultural engineer  assist level: Dependent - Patient 0% Max wheelchair distance: 79'    Wheelchair 50 feet with 2 turns activity    Assist        Assist Level: Dependent - Patient 0%   Wheelchair 150 feet activity     Assist Wheelchair 150 feet activity did not occur: Safety/medical concerns   Assist Level: Dependent - Patient 0%    Medical Problem List and Plan: 1.Functional deficitssecondary to embolic bi-cerebral strokes after TAVR and toxic encephalopathy Continue CIR  PT, OT, SLP 2. DVT Prophylaxis/Anticoagulation: Mechanical:Sequential compression devices, below kneeBilateral lower extremities 3.Chronic LBP/Pain Management:Used hydrocodone and gabapentin at home. 4. Mood:LCSW to follow for evaluation and support. 5. Neuropsych: This patientIs not fullycapable of making decisions on herown behalf. 6. Skin/Wound Care:Routine pressure relief measures 7. Fluids/Electrolytes/Nutrition:  -encourage PO.    -resumed HS ivf 8. T2DM: Continue to glucotrol but hold metformin due to rising creatinine.. Will monitor BS ac/hs.   suboptimal control   -increase glucotrol to 10mg   bid 9. COPD:Add flutter valve.Encourage ISand weanoxygen to off.Asking for her inhaler--will add nebs prn for now. 10. Chronic diastolic CHF:     ASA, Plavix, coreg--  changed Zocor to Lipitor to avoid interaction with amlodipine.  -daily weights Filed Weights   01/21/19 1600 01/22/19 0426 01/23/19 0438  Weight: 68.8 kg 62.6 kg 63.5 kg   Decreased last 2 days, need consistent readings 11. Acute on CKD:  .   -BUN/Cr 23/1.52 on 2/8---30/1.65 2/10---> 29/1.66 today  -resumed HS IVF  -Held Lasix  -may not be too far from baseline 12. HTN: Monitor BP bid. Norvasc added on 1/31.    -fair control 13. Glaucoma: Stable - continue Timoptic and Xalatan.  65.VVZSM metabolic encephalopathy:related to Acute on Chronic KD  -almost all neuro-sedating meds stopped 15. Anemia of chronic disease: Continue iron supplement every other day.   -Hemoglobin up to 10.3  Continue to monitor.   -stool sample not collected yet 16. Leukocytosis:     -WBCs 10.9 2/11  Afebrile  -ucx, cxr negative  -  17.  Hypokalemia  3.3, resume kdur 20  LOS: 8 days A FACE TO [place] 01/24/2019, 7:46 AM

## 2019-01-24 NOTE — Progress Notes (Signed)
Speech Language Pathology Discharge Summary  Patient Details  Name: [name] [name] MRN: [id number] Date of Birth: 1946/08/12  Today's Date: 01/24/2019 SLP Individual Time: 0900-1000 SLP Individual Time Calculation (min): 60 min   Skilled Therapeutic Interventions:  Pt was seen for skilled ST targeting cognitive goals. Pt and SLP traveled to speech room and SLP facilitated session with Min A verbal cues for organization and basic problem solving during a functional grocery ad search and find task. SLP also prompted functional conversation regarding pt's discharge plans to facilitate pt's intellectual and emergent awareness related to current cognitive and physical deficits. Pt verbally acknowledged situations in which she would need assistance related to speech goals with Min A verbal prompts. Pt expressed excited to return home and feels comfortable she can receive 24/7 assistance from family members for all ADLs. Upon return to pt's room, pt attempted to sit in wheelchair without brakes set, therefore ST would like to reiterate the importance of 24/7 supervision upon discharge. Pt was left sitting in wheelchair with chair alarm set, call bell within reach. Recommend continue per current plan of care.    Patient has met 4 of 4 long term goals.  Patient to discharge at [facility] level.  Reasons goals not met:   n/a  Clinical Impression/Discharge Summary:   Pt has made functional gains while inpatient and is discharging having met 4 out of 4 long term goals.  Pt is currently min for tasks due to mild cognitive deficits.  Pt education is complete at this time; no family has been present during [place] sessions.  Pt has demonstrated improved intellectual and emergent awareness as well as functional basic problem solving. Pt will require 24/7 supervision/assistance upon discharge. Pt is discharging home with recommendations for ST follow up at next level of care.  Care Partner:  Caregiver Able to  Provide Assistance: Other (comment)(family has not been present during ST sessions - pt reports family available 24/7)  Type of Caregiver Assistance: Cognitive  Recommendation:  Home Health SLP;24 hour supervision/assistance  Rationale for SLP Follow Up: Maximize cognitive function and independence;Reduce caregiver burden   Equipment: none   Reasons for discharge: Discharged from hospital   Patient/Family Agrees with Progress Made and Goals Achieved: Yes(no family has been present, but pt agreeable)   Jettie Booze, Student SLP    Jettie Booze 01/24/2019, 10:19 AM

## 2019-01-24 NOTE — Progress Notes (Signed)
Physical Therapy Session Note  Patient Details  Name: [name] [name] MRN: [id number] Date of Birth: Oct 15, 1946  Today's Date: 01/24/2019 PT Individual Time: 1304-1401 PT Individual Time Calculation (min): 57 min   Short Term Goals: Week 1:  PT Short Term Goal 1 (Week 1): =LTG due to ELOS  Skilled Therapeutic Interventions/Progress Updates:   Pt received in bed and agreeable to tx. Pt performs bed mobility with supervision. Pt sits<>stands with rollator and supervision. Session focused on endurance and community ambulation with rollator. Pt ambulates from 4th floor<>1st floor gift shop with rollator and close supervision. She requires 3 rest breaks throughout due to SOB during ambulation and therapist provided verbal cuing for pursed lip breathing. Pt maneuvers through gift shop to simulate tight spaces in community store environment with close supervision and verbal cuing to slow down to avoid objects. Pt's SpO2 checked when returned to day room due to SOB and wheezing, SpO2 was 100% and HR 77 bpm. Pt utilizes Nustep level 2 x 3 min with BUE/BLE for global strengthening and endurance and states she needs to return to room for urgent need to void bladder. Pt ambulates to room with rollator and able to perform toilet transfer with supervision and assist to hold up dress. Pt voids bladder and able to perform self-hygiene with supervision. Pt transfers back into bed due to fatigue and therapist reassesses SpO2 as pt is short of breath. SpO2 was 99% and HR 76 bpm, therapist provided verbal cuing for pursed lip breathing. Pt left lying in bed with bed alarm on and all needs in reach.   No c/o pain during session. Pt c/o SOB throughout session and rest breaks provided and SpO2 checked by therapist. Pt states SOB is not a new occurrence.  Therapy Documentation Precautions:  Precautions Precautions: Fall Restrictions Weight Bearing Restrictions: No  Therapy/Group: Individual Therapy  [name] 01/24/2019, 3:03 PM

## 2019-01-24 NOTE — Progress Notes (Signed)
Physical Therapy Discharge Summary  Patient Details  Name: [name] [name] MRN: [id number] Date of Birth: 1946/07/10  Today's Date: 01/24/2019 PT Individual Time: 2706-2376; 1430-1500 PT Individual Time Calculation (min): 60 min and 30 min   Patient has met 6 of 6 long term goals due to improved activity tolerance, improved balance and improved postural control.  Patient to discharge at an ambulatory level Supervision.   Patient's care partner unavailable during therapy sessions to attend family education but was providing Supervision level assist to patient prior to admission and will continue to be able to to provide the necessary cognitive assistance at discharge needed for patient safety.  Reasons goals not met: Pt has met all rehab goals. Wheelchair mobility goal N/A due to patient being ambulatory upon d/c with use of rollator.  Recommendation:  Patient will benefit from ongoing skilled PT services in home health setting to continue to advance safe functional mobility, address ongoing impairments in balance, endurance, strength, safety, and minimize fall risk.  Equipment: No equipment provided. Pt already owns rollator.  Reasons for discharge: treatment goals met and discharge from hospital  Patient/family agrees with progress made and goals achieved: Yes   Skilled Intervention: Session 1: Pt received seated in w/c, agreeable to PT session. No complaints of pain. Pt performs all transfers with Supervision and rollator, v/c for safe brake management. Gait up to 150 ft with rollator and Supervision. Ascend/descend 8 4" stairs with CGA and HHA to simulate pt's home environment. Car transfer with Supervision. Seated BLE strengthening therex x 10-15 reps with 3# ankle weights: LAQ, marches, hip add squeeze, heel/toe raises. Pt left seated EOB in care of RN at end of session.  Session 2: Pt received supine in bed, agreeable with encouragement to participate in therapy session. No  complaints of pain. Bed mobility mod I with increased time needed. Sit to stand Supervision to rollator. Ambulation 2 x 150 ft with rollator and Supervision. Performance of transfer in/out of regular height bed with 4" stool to simulate pt's home environment, Supervision. Pt left supine in bed with needs in reach and bed alarm in place at end of session.   PT Discharge Precautions/Restrictions Precautions Precautions: Fall Restrictions Weight Bearing Restrictions: No Vision/Perception  Vision - History Baseline Vision: Wears glasses only for reading Perception Perception: Within Functional Limits Praxis Praxis: Intact  Cognition Overall Cognitive Status: Impaired/Different from baseline Arousal/Alertness: Awake/alert Orientation Level: Oriented X4 Attention: Sustained Sustained Attention: Appears intact Selective Attention: Appears intact Alternating Attention: Impaired Memory: Impaired Memory Impairment: Storage deficit;Decreased short term memory Awareness: Impaired Awareness Impairment: Anticipatory impairment Problem Solving: Impaired Safety/Judgment: Impaired Sensation Sensation Light Touch: Appears Intact Proprioception: Appears Intact Coordination Gross Motor Movements are Fluid and Coordinated: No Fine Motor Movements are Fluid and Coordinated: Yes Coordination and Movement Description: impaired by generalized weakness Heel Shin Test: intact B Motor  Motor Motor: Other (comment) Motor - Skilled Clinical Observations: generalized weakness  Mobility Bed Mobility Bed Mobility: Rolling Right;Rolling Left;Supine to Sit;Sit to Supine Rolling Right: Independent with assistive device Rolling Left: Independent with assistive device Supine to Sit: Independent with assistive device Sit to Supine: Independent with assistive device Transfers Transfers: Sit to Stand;Stand to Sit;Stand Pivot Transfers Sit to Stand: Supervision/Verbal cueing Stand to Sit:  Supervision/Verbal cueing Stand Pivot Transfers: Supervision/Verbal cueing Stand Pivot Transfer Details: Verbal cues for sequencing;Verbal cues for technique;Verbal cues for precautions/safety;Verbal cues for safe use of DME/AE;Manual facilitation for weight shifting Transfer (Assistive device): 4-wheeled walker Locomotion  Gait Ambulation: Yes Gait Assistance:  Supervision/Verbal cueing Assistive device: 4-wheeled walker Gait Assistance Details: Verbal cues for precautions/safety;Verbal cues for safe use of DME/AE Gait Gait: Yes Gait Pattern: Impaired Gait Pattern: Decreased step length - right;Decreased step length - left;Poor foot clearance - left;Poor foot clearance - right Gait velocity: decreased Stairs / Additional Locomotion Stairs: Yes Stairs Assistance: Contact Guard/Touching assist Stair Management Technique: No rails;Step to pattern;Other (comment)(with HHA) Height of Stairs: 4 Wheelchair Mobility Wheelchair Mobility: No  Trunk/Postural Assessment  Cervical Assessment Cervical Assessment: Within Functional Limits Thoracic Assessment Thoracic Assessment: Exceptions to WFL(rounded shoulders) Lumbar Assessment Lumbar Assessment: Exceptions to WFL(posterior pelvic tilt) Postural Control Postural Control: Within Functional Limits  Balance Balance Balance Assessed: Yes Static Sitting Balance Static Sitting - Balance Support: No upper extremity supported;Feet supported Static Sitting - Level of Assistance: 5: Stand by assistance Dynamic Sitting Balance Dynamic Sitting - Balance Support: No upper extremity supported;Feet supported;During functional activity Dynamic Sitting - Level of Assistance: 5: Stand by assistance Static Standing Balance Static Standing - Balance Support: Bilateral upper extremity supported;During functional activity Static Standing - Level of Assistance: 5: Stand by assistance Dynamic Standing Balance Dynamic Standing - Balance Support: Bilateral  upper extremity supported;During functional activity Dynamic Standing - Level of Assistance: 5: Stand by assistance Extremity Assessment   RLE Assessment RLE Assessment: Within Functional Limits Passive Range of Motion (PROM) Comments: WFL General Strength Comments: 4/5 grossly LLE Assessment LLE Assessment: Within Functional Limits Passive Range of Motion (PROM) Comments: [facility] General Strength Comments: 4+/5 grossly     Excell Seltzer, PT, DPT 01/24/2019, 3:49 PM

## 2019-01-24 NOTE — Discharge Summary (Signed)
Physician Discharge Summary  Patient ID: [name] [name] MRN: [id number] DOB/AGE: 74/24/1947 74 y.o.  Admit date: 01/16/2019 Discharge date: 01/25/2019  Discharge Diagnoses:  Principal Problem:   Embolic stroke [facility]) Active Problems:   Leukocytosis   Metabolic encephalopathy   Chronic diastolic congestive heart failure (HCC)   Diabetes mellitus type 2 in obese (HCC)   Hypokalemia   Acute on chronic renal failure (HCC)   Acute blood loss anemia   Discharged Condition: stable   Significant Diagnostic Studies: Ct Angio Head W Or Wo Contrast  Result Date: 01/13/2019 CLINICAL DATA:  Follow up stroke. Status post aortic valve replacement January 02, 2019. History of hypertension, hyperlipidemia. EXAM: CT ANGIOGRAPHY HEAD AND NECK TECHNIQUE: Multidetector CT imaging of the head and neck was performed using the standard protocol during bolus administration of intravenous contrast. Multiplanar CT image reconstructions and MIPs were obtained to evaluate the vascular anatomy. Carotid stenosis measurements (when applicable) are obtained utilizing NASCET criteria, using the distal internal carotid diameter as the denominator. CONTRAST:  156mL ISOVUE-370 IOPAMIDOL (ISOVUE-370) INJECTION 76% COMPARISON:  MRI head January 13, 2019 FINDINGS: CTA NECK FINDINGS: AORTIC ARCH: 2 vessel arch is a normal variant. Mild calcific atherosclerosis. Mild stenosis RIGHT subclavian artery due to calcific atherosclerosis. The origins of the innominate, left Common carotid artery and subclavian artery are widely patent. RIGHT CAROTID SYSTEM: Common carotid artery is patent. Mild calcific atherosclerosis of the carotid bifurcation without hemodynamically significant stenosis by NASCET criteria. Normal appearance of the internal carotid artery. LEFT CAROTID SYSTEM: Common carotid artery is patent. Mild calcific atherosclerosis of the carotid bifurcation without hemodynamically significant stenosis by NASCET criteria. Normal  appearance of the internal carotid artery. VERTEBRAL ARTERIES:Codominant vertebral arteries. Normal appearance of the vertebral arteries, widely patent. SKELETON: No acute osseous process though bone windows have not been submitted. Severe mid cervical spondylosis, grade 1 C3-4 anterolisthesis. OTHER NECK: Soft tissues of the neck are nonacute though, not tailored for evaluation. UPPER CHEST: Mosaic RIGHT lung attenuation seen with small airway disease. CTA HEAD FINDINGS: ANTERIOR CIRCULATION: Patent cervical internal carotid arteries, petrous, cavernous and supra clinoid internal carotid arteries. Severe calcific atherosclerosis carotid siphons with moderate LEFT and possibly RIGHT paraclinoid stenosis. Patent anterior communicating artery. Patent anterior and middle cerebral arteries, moderate luminal irregularity compatible with atherosclerosis. No large vessel occlusion, contrast extravasation or aneurysm. POSTERIOR CIRCULATION: Patent vertebral arteries, vertebrobasilar junction and basilar artery, as well as main branch vessels. Mild calcific atherosclerosis RIGHT V4 segment. Patent posterior cerebral arteries, moderate luminal irregularity compatible with atherosclerosis. No large vessel occlusion, significant stenosis, contrast extravasation or aneurysm. VENOUS SINUSES: Major dural venous sinuses are patent though not tailored for evaluation on this angiographic examination. ANATOMIC VARIANTS: None. DELAYED PHASE: No abnormal intracranial enhancement. MIP images reviewed. IMPRESSION: CTA NECK: 1. No hemodynamically significant stenosis ICA's. Patent vertebral arteries. CTA HEAD: 1. No emergent large vessel occlusion. 2. Advanced intracranial atherosclerosis with moderate to severe RIGHT and moderate LEFT ICA stenosis. Electronically Signed   By: Elon Alas M.D.   On: 01/13/2019 22:13   Dg Chest 2 View  Result Date: 01/15/2019 CLINICAL DATA:  Wheezing today. EXAM: CHEST - 2 VIEW COMPARISON:  PA and  lateral chest 01/09/2019 and 09/19/2018. CT chest 04/17/2018. FINDINGS: There is cardiomegaly without edema. The patient is status post aortic valve replacement. Lungs are clear. No pneumothorax or pleural fluid. Aortic atherosclerosis noted. No acute or focal bony abnormality. IMPRESSION: No acute disease. Cardiomegaly. Atherosclerosis. Electronically Signed   By: Inge Rise M.D.  On: 01/15/2019 16:42   Dg Chest 2 View  Result Date: 01/10/2019 CLINICAL DATA:  Preop for surgery. EXAM: CHEST - 2 VIEW COMPARISON:  10/15/2018 FINDINGS: The heart is enlarged but stable. Stable mild tortuosity and calcification of the thoracic aorta. Slightly prominent pulmonary hila are unchanged. Linear scarring changes in the left mid lung. No infiltrates, edema or effusions. The bony thorax is intact. IMPRESSION: Stable cardiac enlargement. No acute pulmonary findings. Electronically Signed   By: Marijo Sanes M.D.   On: 01/10/2019 08:04   Ct Angio Neck W Or Wo Contrast  Result Date: 01/13/2019 CLINICAL DATA:  Follow up stroke. Status post aortic valve replacement January 02, 2019. History of hypertension, hyperlipidemia. EXAM: CT ANGIOGRAPHY HEAD AND NECK TECHNIQUE: Multidetector CT imaging of the head and neck was performed using the standard protocol during bolus administration of intravenous contrast. Multiplanar CT image reconstructions and MIPs were obtained to evaluate the vascular anatomy. Carotid stenosis measurements (when applicable) are obtained utilizing NASCET criteria, using the distal internal carotid diameter as the denominator. CONTRAST:  166mL ISOVUE-370 IOPAMIDOL (ISOVUE-370) INJECTION 76% COMPARISON:  MRI head January 13, 2019 FINDINGS: CTA NECK FINDINGS: AORTIC ARCH: 2 vessel arch is a normal variant. Mild calcific atherosclerosis. Mild stenosis RIGHT subclavian artery due to calcific atherosclerosis. The origins of the innominate, left Common carotid artery and subclavian artery are widely  patent. RIGHT CAROTID SYSTEM: Common carotid artery is patent. Mild calcific atherosclerosis of the carotid bifurcation without hemodynamically significant stenosis by NASCET criteria. Normal appearance of the internal carotid artery. LEFT CAROTID SYSTEM: Common carotid artery is patent. Mild calcific atherosclerosis of the carotid bifurcation without hemodynamically significant stenosis by NASCET criteria. Normal appearance of the internal carotid artery. VERTEBRAL ARTERIES:Codominant vertebral arteries. Normal appearance of the vertebral arteries, widely patent. SKELETON: No acute osseous process though bone windows have not been submitted. Severe mid cervical spondylosis, grade 1 C3-4 anterolisthesis. OTHER NECK: Soft tissues of the neck are nonacute though, not tailored for evaluation. UPPER CHEST: Mosaic RIGHT lung attenuation seen with small airway disease. CTA HEAD FINDINGS: ANTERIOR CIRCULATION: Patent cervical internal carotid arteries, petrous, cavernous and supra clinoid internal carotid arteries. Severe calcific atherosclerosis carotid siphons with moderate LEFT and possibly RIGHT paraclinoid stenosis. Patent anterior communicating artery. Patent anterior and middle cerebral arteries, moderate luminal irregularity compatible with atherosclerosis. No large vessel occlusion, contrast extravasation or aneurysm. POSTERIOR CIRCULATION: Patent vertebral arteries, vertebrobasilar junction and basilar artery, as well as main branch vessels. Mild calcific atherosclerosis RIGHT V4 segment. Patent posterior cerebral arteries, moderate luminal irregularity compatible with atherosclerosis. No large vessel occlusion, significant stenosis, contrast extravasation or aneurysm. VENOUS SINUSES: Major dural venous sinuses are patent though not tailored for evaluation on this angiographic examination. ANATOMIC VARIANTS: None. DELAYED PHASE: No abnormal intracranial enhancement. MIP images reviewed. IMPRESSION: CTA NECK: 1.  No hemodynamically significant stenosis ICA's. Patent vertebral arteries. CTA HEAD: 1. No emergent large vessel occlusion. 2. Advanced intracranial atherosclerosis with moderate to severe RIGHT and moderate LEFT ICA stenosis. Electronically Signed   By: Elon Alas M.D.   On: 01/13/2019 22:13   Mr Brain Wo Contrast  Result Date: 01/13/2019 CLINICAL DATA:  Speech difficulty. Difficulty walking. TAVR 01/10/2019 EXAM: MRI HEAD WITHOUT CONTRAST TECHNIQUE: Multiplanar, multiecho pulse sequences of the brain and surrounding structures were obtained without intravenous contrast. COMPARISON:  None. FINDINGS: Brain: Scattered small areas of acute infarct in both cerebral hemispheres. Approximately 10 small acute infarcts are identified compatible with emboli. Mild atrophy and mild chronic microvascular ischemia. Chronic hemorrhage in  the central pons. Negative for hydrocephalus. Negative for mass lesion. Vascular: Normal arterial flow voids Skull and upper cervical spine: Negative Sinuses/Orbits: Mild mucosal edema throughout the paranasal sinuses. Bilateral cataract surgery Other: None IMPRESSION: At least 10 small areas of acute infarct in both cerebral hemispheres most compatible with cerebral emboli. Atrophy and mild chronic microvascular ischemia. Chronic microhemorrhage in the pons likely due to hypertension. Electronically Signed   By: Franchot Gallo M.D.   On: 01/13/2019 14:50   Dg Chest Port 1 View  Result Date: 01/10/2019 CLINICAL DATA:  Status post aortic valve replacement. Assess life-support lines. EXAM: PORTABLE CHEST 1 VIEW COMPARISON:  Chest radiograph January 09, 2019 FINDINGS: Interval aortic valve replacement. Stable cardiomegaly. Calcified aortic arch. Fullness of the RIGHT pulmonary hila most compatible with vascular shadows. Stable LEFT lower lung zone scarring. No pleural effusion or focal consolidation. No pneumothorax. RIGHT internal jugular central venous catheter distal tip projects  in distal superior vena cava. Lumbar spine hardware. IMPRESSION: 1. RIGHT internal jugular central venous catheter distal tip projects in distal superior vena cava. No pneumothorax. 2. Interval aortic valve replacement. 3. Stable cardiomegaly.  No acute pulmonary process. Electronically Signed   By: Elon Alas M.D.   On: 01/10/2019 16:56    Labs:  Basic Metabolic Panel: Recent Labs  Lab 01/21/19 0525 01/23/19 0454 01/24/19 0520 01/25/19 0608  NA 142 137 138 137  K 3.4* 3.6 3.3* 3.7  CL 103 103 102 103  CO2 24 25 24 26   GLUCOSE 166* 171* 92 91  BUN 23 30* 29* 29*  CREATININE 1.52* 1.65* 1.66* 1.55*  CALCIUM 9.1 8.4* 8.6* 8.4*    CBC: Recent Labs  Lab 01/21/19 0525 01/24/19 0520  WBC 11.6* 10.9*  HGB 9.8* 8.7*  HCT 29.3* 27.0*  MCV 87.2 87.1  PLT 238 279    CBG: Recent Labs  Lab 01/24/19 1655 01/24/19 2109 01/25/19 0638 01/25/19 1202 01/25/19 1701  GLUCAP 247* 94 84 246* 149*    Brief HPI:   [name] [name] is a 73 year old female with history of chronic diastolic CHF, COPD, X1GG, 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 Caryl Bis.  Postop had issues with bilateral lower extremity weakness with myoclonic jerks, BUE dysmetria and inability to walk.  She was also noted to be lethargic and neurology was consulted for input.  Dr. 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  MRI brain done revealing multiple small areas of acute infarct in bilateral cerebral hemisphere compatible with cardioembolic some.  CTA head/neck was negative for emergent large vessel occlusion and showed moderate to severe right-ICA and moderate left-ICA stenosis.  Neurology felt no further work-up needed and to follow-up with [facility] on outpatient basis.  Lethargy was resolving but she continued to have issues with weakness, poor safety awareness, cognitive  deficits as well as impulsivity.  CIR was recommended due to functional decline affecting mobility and ADL tasks.   Hospital Course: [name] [name] was admitted to rehab 01/16/2019 for inpatient therapies to consist of PT, ST and OT at least three hours five days a week. Past admission physiatrist, therapy team and rehab RN have worked together to provide customized collaborative inpatient rehab. She continued to have bouts of confusion especially in the evenings.  Diabetes has been monitored with ac/hs CBG checks. BS were noted to be poorly controlled and glucotrol was  increased to 10 mg bid. Metformin was discontinued to to CKD. Confusion felt to be due to toxic metabolic encephalopathy due to acute on chronic renal failure.Follow up BMET past admission revealed worsening of acute on chronic renal failure with  rise in BUN/SCr to 53/3.45.  Lasix was discontinued and she was treated with IVF intermittently to help maintain hydration status. Follow up labs showed improvement and no signs of overload noted. Weight is down to 142 lbs.   Due to ongoing issues with confusion at nights, majority of neuro sedating medications were discontinued. She continues on Xanax prn at bedtime to help with sleep hygiene.   Follow up CBC showed leucocytosis. She has been afebrile and groin incision are healing well without signs of infection. UA/UCS was negative and leucocytosis is resolving.  Acute on chronic anemia is stable overall with hemodilution noted due to hydration. She has progress to supervision to min assist level. She will continue to receive follow up [place], [place], [place] and [facility] by [place] after discharge.    Rehab course: During patient's stay in rehab weekly team conferences were held to monitor patient's progress, set goals and discuss barriers to discharge. At admission, patient required in assist with basic self care tasks and with mobility.  She exhibited mild to moderate cognitive linguistic  impairments with delayed processing as well as lethargy.  She  has had improvement in activity tolerance, balance, postural control as well as ability to compensate for deficits. She requires supervision to complete ADL tasks. She requires supervision for transfers and to ambulate 150' with rollator. She requires CGA to navigate stairs.  She requires min verbal cues for thought organization and basic problem solving. Family plans to continue to provide 24 hours physical assistance and cognitive assistance after discharge.     Disposition: Home  Diet: heart healthy.   Special Instructions: 1. Monitor weights daily.  2. Needs BMET/CBC repeated in one week for stability.   Discharge Instructions    Ambulatory referral to Physical Medicine Rehab   Complete by:  As directed    1-2 weeks transitional care appt     Allergies as of 01/25/2019   No Known Allergies     Medication List    STOP taking these medications   cimetidine 200 MG tablet Commonly known as:  TAGAMET   Cinnamon 500 MG capsule   [employer] VIT D 5000 HIGH-POTENCY 125 MCG (5000 UT) capsule Generic drug:  Cholecalciferol   Fish Oil 1000 MG Caps   furosemide 20 MG tablet Commonly known as:  LASIX   gabapentin 100 MG capsule Commonly known as:  NEURONTIN   HYDROcodone-acetaminophen 10-325 MG tablet Commonly known as:  NORCO   meloxicam 15 MG tablet Commonly known as:  MOBIC   metFORMIN 1000 MG tablet Commonly known as:  GLUCOPHAGE   simvastatin 20 MG tablet Commonly known as:  ZOCOR   Turmeric 500 MG Tabs   TYLENOL 8 HOUR ARTHRITIS PAIN 650 MG CR tablet Generic drug:  acetaminophen Replaced by:  acetaminophen 325 MG tablet   vitamin E 400 UNIT capsule     TAKE these medications   acetaminophen 325 MG tablet Commonly known as:  TYLENOL Take 1-2 tablets (325-650 mg total) by mouth every 4 (four) hours as needed for mild pain. Replaces:  TYLENOL 8 HOUR ARTHRITIS PAIN 650 MG CR tablet   ALPRAZolam 0.5 MG  tablet Commonly known as:  XANAX Take 0.5 mg by mouth at bedtime as needed for sleep.  amLODipine 10 MG tablet Commonly known as:  NORVASC Take 1 tablet (10 mg total) by mouth at bedtime.   aspirin EC 81 MG tablet Take 81 mg by mouth daily.   atorvastatin 20 MG tablet Commonly known as:  LIPITOR Take 1 tablet (20 mg total) by mouth daily at 6 PM.   beta carotene w/minerals tablet Take 1 tablet by mouth daily.   carvedilol 6.25 MG tablet Commonly known as:  COREG Take 1 tablet (6.25 mg total) by mouth 2 (two) times daily with a meal.   clopidogrel 75 MG tablet Commonly known as:  PLAVIX Take 1 tablet (75 mg total) by mouth daily with breakfast.   ferrous sulfate 325 (65 FE) MG tablet Take 1 tablet (325 mg total) by mouth every other day.   fluticasone 50 MCG/ACT nasal spray Commonly known as:  FLONASE Place into both nostrils daily as needed for allergies or rhinitis.   glipiZIDE 10 MG tablet Commonly known as:  GLUCOTROL Take 1 tablet (10 mg total) by mouth 2 (two) times daily before a meal. What changed:  when to take this   latanoprost 0.005 % ophthalmic solution Commonly known as:  XALATAN Place 1 drop into both eyes at bedtime.   levothyroxine 88 MCG tablet Commonly known as:  SYNTHROID, LEVOTHROID Take 1 tablet (88 mcg total) by mouth daily.   potassium chloride SA 20 MEQ tablet Commonly known as:  K-DUR,KLOR-CON Take 1 tablet (20 mEq total) by mouth daily. What changed:  how much to take   prenatal multivitamin Tabs tablet Take 1 tablet by mouth daily.   timolol 0.5 % ophthalmic solution Commonly known as:  TIMOPTIC Place 1 drop into both eyes 2 (two) times daily.   trolamine salicylate 10 % cream Commonly known as:  ASPERCREME Apply 1 application topically as needed for muscle pain.   VENTOLIN HFA 108 (90 Base) MCG/ACT inhaler Generic drug:  albuterol Inhale 2 puffs into the lungs every 4 (four) hours as needed for wheezing or shortness of  breath.   vitamin B-12 1000 MCG tablet Commonly known as:  CYANOCOBALAMIN Take 1,000 mcg by mouth daily.   vitamin C 500 MG tablet Commonly known as:  ASCORBIC ACID Take 500 mg by mouth daily.      Follow-up Information    Redmond School, MD Follow up on 02/01/2019.   Specialty:  Internal Medicine Why:  @ 10:45  am (hospital follow up appt) Contact information: [place] [address] [phone]        Herminio Commons, MD .   Specialty:  Cardiology Contact information: [place] [address] [phone]        Meredith Staggers, MD Follow up.   Specialty:  Physical Medicine and Rehabilitation Why:  office will call you with follow up appointment Contact information: [address] [place] [address] [phone]           Signed: Bary Leriche 01/27/2019, 6:43 PM

## 2019-01-25 DIAGNOSIS — N189 Chronic kidney disease, unspecified: Secondary | ICD-10-CM

## 2019-01-25 DIAGNOSIS — N179 Acute kidney failure, unspecified: Secondary | ICD-10-CM

## 2019-01-25 LAB — BASIC METABOLIC PANEL
Anion gap: 8 (ref 5–15)
BUN: 29 mg/dL — ABNORMAL HIGH (ref 8–23)
CALCIUM: 8.4 mg/dL — AB (ref 8.9–10.3)
CO2: 26 mmol/L (ref 22–32)
Chloride: 103 mmol/L (ref 98–111)
Creatinine, Ser: 1.55 mg/dL — ABNORMAL HIGH (ref 0.44–1.00)
GFR calc Af Amer: 38 mL/min — ABNORMAL LOW (ref >60)
GFR calc non Af Amer: 33 mL/min — ABNORMAL LOW (ref >60)
GLUCOSE: 91 mg/dL (ref 70–99)
Potassium: 3.7 mmol/L (ref 3.5–5.1)
Sodium: 137 mmol/L (ref 135–145)

## 2019-01-25 LAB — GLUCOSE, CAPILLARY
GLUCOSE-CAPILLARY: 84 mg/dL (ref 70–99)
Glucose-Capillary: 246 mg/dL — ABNORMAL HIGH (ref 70–99)
Glucose-Capillary: 149 mg/dL — ABNORMAL HIGH (ref 70–99)

## 2019-01-25 MED ORDER — GLIPIZIDE 10 MG PO TABS: 10.0000 mg | ORAL_TABLET | Freq: Two times a day (BID) | ORAL | 0 refills | Status: AC

## 2019-01-25 MED ORDER — CLOPIDOGREL BISULFATE 75 MG PO TABS: 75.0000 mg | ORAL_TABLET | Freq: Every day | ORAL | 0 refills | Status: DC

## 2019-01-25 MED ORDER — AMLODIPINE BESYLATE 10 MG PO TABS: 10.0000 mg | ORAL_TABLET | Freq: Every day | ORAL | 0 refills | Status: DC

## 2019-01-25 MED ORDER — POTASSIUM CHLORIDE CRYS ER 20 MEQ PO TBCR: 20.0000 meq | EXTENDED_RELEASE_TABLET | Freq: Every day | ORAL | 0 refills | Status: DC

## 2019-01-25 MED ORDER — ACETAMINOPHEN 325 MG PO TABS: 325.0000 mg | ORAL_TABLET | ORAL | Status: AC | PRN

## 2019-01-25 MED ORDER — LEVOTHYROXINE SODIUM 88 MCG PO TABS: 88.0000 ug | ORAL_TABLET | Freq: Every day | ORAL | 0 refills | Status: DC

## 2019-01-25 MED ORDER — FERROUS SULFATE 325 (65 FE) MG PO TABS: 325.0000 mg | ORAL_TABLET | ORAL | 0 refills | Status: DC

## 2019-01-25 MED ORDER — ATORVASTATIN CALCIUM 20 MG PO TABS: 20.0000 mg | ORAL_TABLET | Freq: Every day | ORAL | 0 refills | Status: DC

## 2019-01-25 MED ORDER — CARVEDILOL 6.25 MG PO TABS: 6.2500 mg | ORAL_TABLET | Freq: Two times a day (BID) | ORAL | 0 refills | Status: DC

## 2019-01-25 NOTE — Progress Notes (Signed)
Patient's discharge instructions were given & explained. Pharmacy information, medication list & appointments explained. Night medications were given prior to departure & family member was informed. Family member Astrid Vides was present & verbalized understanding. She left by car, no distress noted.

## 2019-01-25 NOTE — Discharge Instructions (Signed)
Inpatient Rehab Discharge Instructions  [name] [name] Discharge date and time: 02/03/19   Activities/Precautions/ Functional Status: Activity: no lifting, driving, or strenuous exercise for till cleared by MD Diet: cardiac diet and diabetic diet Wound Care: keep wound clean and dry   Functional status:  ___ No restrictions     ___ Walk up steps independently _X__ 24/7 supervision/assistance   ___ Walk up steps with assistance ___ Intermittent supervision/assistance  ___ Bathe/dress independently ___ Walk with walker     _X__ Bathe/dress with assistance ___ Walk Independently    ___ Shower independently ___ Walk with assistance    ___ Shower with assistance _X__ No alcohol     ___ Return to work/school ________  Special Instructions: 1. Monitor blood sugars before meals and at bedtime. 2. Need to drink plenty of fluids.     Heart Failure Heart failure is a condition in which the heart has trouble pumping blood because it has become weak or stiff. This means that the heart does not pump blood efficiently for the body to work well. For some people with heart failure, fluid may back up into the lungs and there may be swelling (edema) in the lower legs. Heart failure is usually a long-term (chronic) condition. It is important for you to take good care of yourself and follow the treatment plan from your health care provider. What are the causes? This condition is caused by some health problems, including:  High blood pressure (hypertension). Hypertension causes the heart muscle to work harder than normal. High blood pressure eventually causes the heart to become stiff and weak.  Coronary artery disease (CAD). CAD is the buildup of cholesterol and fat (plaques) in the arteries of the heart.  Heart attack (myocardial infarction). Injured tissue, which is caused by the heart attack, does not contract as well and the heart's ability to pump blood is weakened.  Abnormal heart valves.  When the heart valves do not open and close properly, the heart muscle must pump harder to keep the blood flowing.  Heart muscle disease (cardiomyopathy or myocarditis). Heart muscle disease is damage to the heart muscle from a variety of causes, such as drug or alcohol abuse, infections, or unknown causes. These can increase the risk of heart failure.  Lung disease. When the lungs do not work properly, the heart must work harder. What increases the risk? Risk of heart failure increases as a person ages. This condition is also more likely to develop in people who:  Are overweight.  Are female.  Smoke or chew tobacco.  Abuse alcohol or illegal drugs.  Have taken medicines that can damage the heart, such as chemotherapy drugs.  Have diabetes. ? High blood sugar (glucose) is associated with high fat (lipid) levels in the blood. ? Diabetes can also damage tiny blood vessels that carry nutrients to the heart muscle.  Have abnormal heart rhythms.  Have thyroid problems.  Have low blood counts (anemia). What are the signs or symptoms? Symptoms of this condition include:  Shortness of breath with activity, such as when climbing stairs.  Persistent cough.  Swelling of the feet, ankles, legs, or abdomen.  Unexplained weight gain.  Difficulty breathing when lying flat (orthopnea).  Waking from sleep because of the need to sit up and get more air.  Rapid heartbeat.  Fatigue and loss of energy.  Feeling light-headed, dizzy, or close to fainting.  Loss of appetite.  Nausea.  Increased urination during the night (nocturia).  Confusion. How is this  diagnosed? This condition is diagnosed based on:  Medical history, symptoms, and a physical exam.  Diagnostic tests, which may include: ? Echocardiogram. ? Electrocardiogram (ECG). ? Chest X-ray. ? Blood tests. ? Exercise stress test. ? Radionuclide scans. ? Cardiac catheterization and angiogram. How is this  treated? Treatment for this condition is aimed at managing the symptoms of heart failure. Medicines, behavioral changes, or other treatments may be necessary to treat heart failure. Medicines These may include:  Angiotensin-converting enzyme (ACE) inhibitors. This type of medicine blocks the effects of a blood protein called angiotensin-converting enzyme. ACE inhibitors relax (dilate) the blood vessels and help to lower blood pressure.  Angiotensin receptor blockers (ARBs). This type of medicine blocks the actions of a blood protein called angiotensin. ARBs dilate the blood vessels and help to lower blood pressure.  Water pills (diuretics). Diuretics cause the kidneys to remove salt and water from the blood. The extra fluid is removed through urination, leaving a lower volume of blood that the heart has to pump.  Beta blockers. These improve heart muscle strength and they prevent the heart from beating too quickly.  Digoxin. This increases the force of the heartbeat. Healthy behavior changes These may include:  Reaching and maintaining a healthy weight.  Stopping smoking or chewing tobacco.  Eating heart-healthy foods.  Limiting or avoiding alcohol.  Stopping use of street drugs (illegal drugs).  Physical activity. Other treatments These may include:  Surgery to open blocked coronary arteries or repair damaged heart valves.  Placement of a biventricular pacemaker to improve heart muscle function (cardiac resynchronization therapy). This device paces both the right ventricle and left ventricle.  Placement of a device to treat serious abnormal heart rhythms (implantable cardioverter defibrillator, or ICD).  Placement of a device to improve the pumping ability of the heart (left ventricular assist device, or LVAD).  Heart transplant. This can cure heart failure, and it is considered for certain patients who do not improve with other therapies. Follow these instructions at  home: Medicines  Take over-the-counter and prescription medicines only as told by your health care provider. Medicines are important in reducing the workload of your heart, slowing the progression of heart failure, and improving your symptoms. ? Do not stop taking your medicine unless your health care provider told you to do that. ? Do not skip any dose of medicine. ? Refill your prescriptions before you run out of medicine. You need your medicines every day. Eating and drinking   Eat heart-healthy foods. Talk with a dietitian to make an eating plan that is right for you. ? Choose foods that contain no trans fat and are low in saturated fat and cholesterol. Healthy choices include fresh or frozen fruits and vegetables, fish, lean meats, legumes, fat-free or low-fat dairy products, and whole-grain or high-fiber foods. ? Limit salt (sodium) if directed by your health care provider. Sodium restriction may reduce symptoms of heart failure. Ask a dietitian to recommend heart-healthy seasonings. ? Use healthy cooking methods instead of frying. Healthy methods include roasting, grilling, broiling, baking, poaching, steaming, and stir-frying.  Limit your fluid intake if directed by your health care provider. Fluid restriction may reduce symptoms of heart failure. Lifestyle   Stop smoking or using chewing tobacco. Nicotine and tobacco can damage your heart and your blood vessels. Do not use nicotine gum or patches before talking to your health care provider.  Limit alcohol intake to no more than 1 drink per day for non-pregnant women and 2 drinks per day  for men. One drink equals 12 oz of beer, 5 oz of wine, or 1 oz of hard liquor. ? Drinking more than that is harmful to your heart. Tell your health care provider if you drink alcohol several times a week. ? Talk with your health care provider about whether any level of alcohol use is safe for you. ? If your heart has already been damaged by alcohol  or you have severe heart failure, drinking alcohol should be stopped completely.  Stop use of illegal drugs.  Lose weight if directed by your health care provider. Weight loss may reduce symptoms of heart failure.  Do moderate physical activity if directed by your health care provider. People who are elderly and people with severe heart failure should consult with a health care provider for physical activity recommendations. Monitor important information   Weigh yourself every day. Keeping track of your weight daily helps you to notice excess fluid sooner. ? Weigh yourself every morning after you urinate and before you eat breakfast. ? Wear the same amount of clothing each time you weigh yourself. ? Record your daily weight. Provide your health care provider with your weight record.  Monitor and record your blood pressure as told by your health care provider.  Check your pulse as told by your health care provider. Dealing with extreme temperatures  If the weather is extremely hot: ? Avoid vigorous physical activity. ? Use air conditioning or fans or seek a cooler location. ? Avoid caffeine and alcohol. ? Wear loose-fitting, lightweight, and light-colored clothing.  If the weather is extremely cold: ? Avoid vigorous physical activity. ? Layer your clothes. ? Wear mittens or gloves, a hat, and a scarf when you go outside. ? Avoid alcohol. General instructions  Manage other health conditions such as hypertension, diabetes, thyroid disease, or abnormal heart rhythms as told by your health care provider.  Learn to manage stress. If you need help to do this, ask your health care provider.  Plan rest periods when fatigued.  Get ongoing education and support as needed.  Participate in or seek rehabilitation as needed to maintain or improve independence and quality of life.  Stay up to date with immunizations. Keeping current on pneumococcal and influenza immunizations is especially  important to prevent respiratory infections.  Keep all follow-up visits as told by your health care provider. This is important. Contact a health care provider if:  You have a rapid weight gain.  You have increasing shortness of breath that is unusual for you.  You are unable to participate in your usual physical activities.  You tire easily.  You cough more than normal, especially with physical activity.  You have any swelling or more swelling in areas such as your hands, feet, ankles, or abdomen.  You are unable to sleep because it is hard to breathe.  You feel like your heart is beating quickly (palpitations).  You become dizzy or light-headed when you stand up. Get help right away if:  You have difficulty breathing.  You notice or your family notices a change in your awareness, such as having trouble staying awake or having difficulty with concentration.  You have pain or discomfort in your chest.  You have an episode of fainting (syncope). This information is not intended to replace advice given to you by your health care provider. Make sure you discuss any questions you have with your health care provider. Document Released: 11/30/2005 Document Revised: 10/29/2017 Document Reviewed: 06/24/2016 Elsevier Interactive Patient Education  2019 [place].   My questions have been answered and I understand these instructions. I will adhere to these goals and the provided educational materials after my discharge from the hospital.  Patient/Caregiver Signature _______________________________ Date __________  Clinician Signature _______________________________________ Date __________  Please bring this form and your medication list with you to all your follow-up doctor's appointments.

## 2019-01-25 NOTE — Progress Notes (Signed)
Social Work  Discharge Note  The overall goal for the admission was met for:   Discharge location: Yes - home with family able to provide 24/7 supervision  Length of Stay: Yes - 9 days  Discharge activity level: Yes - supervision/ min assist  Home/community participation: Yes  Services provided included: MD, RD, PT, OT, SLP, RN, TR, Pharmacy and [place]: Medicare  Follow-up services arranged: Home Health: RN, PT, OT, ST via [facility] and Patient/Family has no preference for HH/DME agencies  Comments (or additional information):  Contact #s:  Pt @ [phone]  Daughter, [name] @ [phone]  Patient/Family verbalized understanding of follow-up arrangements: Yes  Individual responsible for coordination of the follow-up plan: pt  Confirmed correct DME delivered: NA - had all needed DME     [name] [name]

## 2019-01-25 NOTE — Plan of Care (Signed)
  Problem: Consults Goal: [facility] STROKE PATIENT EDUCATION Description See Patient Education module for education specifics  Outcome: Progressing   Problem: RH BLADDER ELIMINATION Goal: RH STG MANAGE BLADDER WITH ASSISTANCE Description STG Manage Bladder With Mod I Assistance  Outcome: Progressing   Problem: RH SKIN INTEGRITY Goal: RH STG SKIN FREE OF INFECTION/BREAKDOWN Description No new breakdown with min assist   Outcome: Progressing   Problem: RH SAFETY Goal: RH STG ADHERE TO SAFETY PRECAUTIONS W/ASSISTANCE/DEVICE Description STG Adhere to Safety Precautions With Min Assistance/Device.  Outcome: Progressing   Problem: RH PAIN MANAGEMENT Goal: RH STG PAIN MANAGED AT OR BELOW PT'S PAIN GOAL Description < 3 out of 10.   Outcome: Progressing

## 2019-01-25 NOTE — Progress Notes (Signed)
PHYSICAL MEDICINE & REHABILITATION PROGRESS NOTE   Subjective/Complaints: Pt at bedside. Feeling well. Excited to be going home today. Not sure when daugthers can come pick up though.   Objective:   No results found. Recent Labs    01/24/19 0520  WBC 10.9*  HGB 8.7*  HCT 27.0*  PLT 279   Recent Labs    01/24/19 0520 01/25/19 0608  NA 138 137  K 3.3* 3.7  CL 102 103  CO2 24 26  GLUCOSE 92 91  BUN 29* 29*  CREATININE 1.66* 1.55*  CALCIUM 8.6* 8.4*    Intake/Output Summary (Last 24 hours) at 01/25/2019 0908 Last data filed at 01/25/2019 0600 Gross per 24 hour  Intake 1230 ml  Output 4 ml  Net 1226 ml     Physical Exam: Vital Signs Blood pressure (!) 146/58, pulse 65, temperature (!) 97.5 F (36.4 C), temperature source Oral, resp. rate 15, height 4\' 9"  (1.448 m), weight 64.5 kg, SpO2 98 %. Constitutional: No distress . Vital signs reviewed. HEENT: EOMI, oral membranes moist Neck: supple Cardiovascular: RRR without murmur. No JVD    Respiratory: CTA Bilaterally without wheezes or rales. Normal effort    GI: BS +, non-tender, non-distended  Musc: No edema or tenderness in extremities. Neuro: arouses easily. Follows commands. Fair awareness,  Motor: Grossly 4-/5 throughout, unchanged  Psych: pleasant and cooperative   Assessment/Plan: 1. 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  Physiatrist is providing close team supervision and 24 hour management of active medical problems listed below.  Physiatrist and rehab team continue to assess barriers to discharge/monitor patient progress toward functional and medical goals  Care Tool:  Bathing  Bathing activity did not occur: Refused Body parts bathed by patient: Right arm, Left arm, Chest, Abdomen, Front perineal area, Buttocks, Right upper leg, Left upper leg, Right lower leg,  Left lower leg, Face(per pt report)         Bathing assist Assist Level: Supervision/Verbal cueing     Upper Body Dressing/Undressing Upper body dressing   What is the patient wearing?: Pull over shirt    Upper body assist Assist Level: Supervision/Verbal cueing    Lower Body Dressing/Undressing Lower body dressing      What is the patient wearing?: Incontinence brief, Pants     Lower body assist Assist for lower body dressing: Supervision/Verbal cueing     Toileting Toileting    Toileting assist Assist for toileting: Supervision/Verbal cueing     Transfers Chair/bed transfer  Transfers assist     Chair/bed transfer assist level: Supervision/Verbal cueing     Locomotion Ambulation   Ambulation assist      Assist level: Supervision/Verbal cueing Assistive device: Rollator Max distance: 250 ft   Walk 10 feet activity   Assist     Assist level: Supervision/Verbal cueing Assistive device: Rollator   Walk 50 feet activity   Assist    Assist level: Supervision/Verbal cueing Assistive device: Rollator    Walk 150 feet activity   Assist Walk 150 feet activity did not occur: Safety/medical concerns  Assist level: Supervision/Verbal cueing Assistive device: Rollator    Walk 10 feet on uneven surface  activity   Assist Walk 10 feet on uneven surfaces activity did not occur: Safety/medical concerns         Wheelchair     Assist Will patient use wheelchair at discharge?: No Type of Wheelchair:  Manual    Wheelchair assist level: Dependent - Patient 0% Max wheelchair distance: 21'    Wheelchair 50 feet with 2 turns activity    Assist        Assist Level: Dependent - Patient 0%   Wheelchair 150 feet activity     Assist Wheelchair 150 feet activity did not occur: Safety/medical concerns   Assist Level: Dependent - Patient 0%    Medical Problem List and Plan: 1.Functional deficitssecondary to 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  2. DVT Prophylaxis/Anticoagulation: Mechanical:Sequential compression devices, below kneeBilateral lower extremities 3.Chronic LBP/Pain Management:Used hydrocodone and gabapentin at home. 4. Mood:LCSW to follow for evaluation and support. 5. Neuropsych: This patientIs not fullycapable of making decisions on herown behalf. 6. Skin/Wound Care:Routine pressure relief measures 7. Fluids/Electrolytes/Nutrition:  -encourage PO.    -dc HS ivf 8. T2DM: held metformin due to elevated creatinine..    Improving but inconsistent control   -increased glucotrol to 10mg   bid 9. COPD:Add flutter valve.Encourage ISand weanoxygen to off.Asking for her inhaler--will add nebs prn for now. 10. Chronic diastolic CHF:     ASA, Plavix, coreg--  changed Zocor to Lipitor to avoid interaction with amlodipine.  -daily weights Filed Weights   01/22/19 0426 01/23/19 0438 01/25/19 0541  Weight: 62.6 kg 63.5 kg 64.5 kg   Decreased last 2 days, need consistent readings 11. Acute on CKD:  .   -BUN/Cr 23/1.52 on 2/8---30/1.65 2/10---> 29/1.55 today  -dc HS IVF  -Held Lasix---consider resuming at 20mg  daily once home---follow up with primary to discuss, check weight, etc  -may not be too far from baseline 12. HTN: Monitor BP bid. Norvasc added on 1/31.    -fair control 13. Glaucoma: Stable - continue Timoptic and Xalatan.  39.QZESP metabolic encephalopathy:related to Acute on Chronic KD  -almost all neuro-sedating meds stopped 15. Anemia of chronic disease: Continue iron supplement every other day.   -Hemoglobin up to 10.3  Continue to monitor.   -stool sample not collected yet 16. Leukocytosis:     -WBCs 10.9 2/11  Afebrile  -ucx, cxr negative  -  17.  Hypokalemia  3.7 2/12, continue kdur  20  LOS: 9 days A FACE TO [place] 01/25/2019, 9:08 AM

## 2019-01-25 NOTE — Plan of Care (Signed)
  Problem: Consults Goal: [facility] STROKE PATIENT EDUCATION Description See Patient Education module for education specifics  01/25/2019 1855 by Adria Devon, LPN Outcome: Completed/Met 01/25/2019 1514 by Ellison Carwin A, LPN Outcome: Progressing   Problem: RH BLADDER ELIMINATION Goal: RH STG MANAGE BLADDER WITH ASSISTANCE Description STG Manage Bladder With Mod I Assistance  01/25/2019 1855 by Ellison Carwin A, LPN Outcome: Completed/Met 01/25/2019 1514 by Adria Devon, LPN Outcome: Progressing   Problem: RH SKIN INTEGRITY Goal: RH STG SKIN FREE OF INFECTION/BREAKDOWN Description No new breakdown with min assist   01/25/2019 1855 by Ellison Carwin A, LPN Outcome: Completed/Met 01/25/2019 1514 by Ellison Carwin A, LPN Outcome: Progressing   Problem: RH SAFETY Goal: RH STG ADHERE TO SAFETY PRECAUTIONS W/ASSISTANCE/DEVICE Description STG Adhere to Safety Precautions With Min Assistance/Device.  01/25/2019 1855 by Adria Devon, LPN Outcome: Completed/Met 01/25/2019 1514 by Ellison Carwin A, LPN Outcome: Progressing   Problem: RH PAIN MANAGEMENT Goal: RH STG PAIN MANAGED AT OR BELOW PT'S PAIN GOAL Description < 3 out of 10.   01/25/2019 1855 by Adria Devon, LPN Outcome: Completed/Met 01/25/2019 1514 by Adria Devon, LPN Outcome: Progressing

## 2019-01-26 ENCOUNTER — Ambulatory Visit: Payer: Self-pay | Admitting: Physician Assistant

## 2019-01-27 ENCOUNTER — Telehealth: Payer: Self-pay

## 2019-01-27 DIAGNOSIS — D62 Acute posthemorrhagic anemia: Secondary | ICD-10-CM

## 2019-01-27 DIAGNOSIS — I130 Hypertensive heart and chronic kidney disease with heart failure and stage 1 through stage 4 chronic kidney disease, or unspecified chronic kidney disease: Secondary | ICD-10-CM | POA: Diagnosis not present

## 2019-01-27 DIAGNOSIS — M6281 Muscle weakness (generalized): Secondary | ICD-10-CM | POA: Diagnosis not present

## 2019-01-27 DIAGNOSIS — I69818 Other symptoms and signs involving cognitive functions following other cerebrovascular disease: Secondary | ICD-10-CM | POA: Diagnosis not present

## 2019-01-27 DIAGNOSIS — E785 Hyperlipidemia, unspecified: Secondary | ICD-10-CM | POA: Diagnosis not present

## 2019-01-27 DIAGNOSIS — N183 Chronic kidney disease, stage 3 (moderate): Secondary | ICD-10-CM | POA: Diagnosis not present

## 2019-01-27 DIAGNOSIS — I5032 Chronic diastolic (congestive) heart failure: Secondary | ICD-10-CM | POA: Diagnosis not present

## 2019-01-27 DIAGNOSIS — Z7982 Long term (current) use of aspirin: Secondary | ICD-10-CM | POA: Diagnosis not present

## 2019-01-27 DIAGNOSIS — Z7984 Long term (current) use of oral hypoglycemic drugs: Secondary | ICD-10-CM | POA: Diagnosis not present

## 2019-01-27 DIAGNOSIS — M069 Rheumatoid arthritis, unspecified: Secondary | ICD-10-CM | POA: Diagnosis not present

## 2019-01-27 DIAGNOSIS — J449 Chronic obstructive pulmonary disease, unspecified: Secondary | ICD-10-CM | POA: Diagnosis not present

## 2019-01-27 DIAGNOSIS — G253 Myoclonus: Secondary | ICD-10-CM | POA: Diagnosis not present

## 2019-01-27 DIAGNOSIS — Z952 Presence of prosthetic heart valve: Secondary | ICD-10-CM | POA: Diagnosis not present

## 2019-01-27 DIAGNOSIS — I69398 Other sequelae of cerebral infarction: Secondary | ICD-10-CM | POA: Diagnosis not present

## 2019-01-27 DIAGNOSIS — E1122 Type 2 diabetes mellitus with diabetic chronic kidney disease: Secondary | ICD-10-CM | POA: Diagnosis not present

## 2019-01-27 DIAGNOSIS — R296 Repeated falls: Secondary | ICD-10-CM | POA: Diagnosis not present

## 2019-01-27 NOTE — Telephone Encounter (Signed)
Transitional Care call-patient    1. Are you/is patient experiencing any problems since coming home? No Are there any questions regarding any aspect of care? No 2. Are there any questions regarding medications administration/dosing? No Are meds being taken as prescribed? Yes Patient should review meds with caller to confirm 3. Have there been any falls? No 4. Has Home Health been to the house and/or have they contacted you? Yes If not, have you tried to contact them? Can we help you contact them? 5. Are bowels and bladder emptying properly? Yes Are there any unexpected incontinence issues? No If applicable, is patient following bowel/bladder programs? 6. Any fevers, problems with breathing, unexpected pain? No 7. Are there any skin problems or new areas of breakdown? No 8. Has the patient/family member arranged specialty MD follow up (ie cardiology/neurology/renal/surgical/etc)? Yes Can we help arrange? 9. Does the patient need any other services or support that we can help arrange? No 10. Are caregivers following through as expected in assisting the patient? Yes 11. Has the patient quit smoking, drinking alcohol, or using drugs as recommended? Yes  Appointment time, arrive time 1:20 pm for 1:40pm with Zella Ball on 02/07/2019 [place]

## 2019-01-30 DIAGNOSIS — I69398 Other sequelae of cerebral infarction: Secondary | ICD-10-CM | POA: Diagnosis not present

## 2019-01-30 DIAGNOSIS — Z7984 Long term (current) use of oral hypoglycemic drugs: Secondary | ICD-10-CM | POA: Diagnosis not present

## 2019-01-30 DIAGNOSIS — E785 Hyperlipidemia, unspecified: Secondary | ICD-10-CM | POA: Diagnosis not present

## 2019-01-30 DIAGNOSIS — I5032 Chronic diastolic (congestive) heart failure: Secondary | ICD-10-CM | POA: Diagnosis not present

## 2019-01-30 DIAGNOSIS — G253 Myoclonus: Secondary | ICD-10-CM | POA: Diagnosis not present

## 2019-01-30 DIAGNOSIS — Z7982 Long term (current) use of aspirin: Secondary | ICD-10-CM | POA: Diagnosis not present

## 2019-01-30 DIAGNOSIS — M069 Rheumatoid arthritis, unspecified: Secondary | ICD-10-CM | POA: Diagnosis not present

## 2019-01-30 DIAGNOSIS — M6281 Muscle weakness (generalized): Secondary | ICD-10-CM | POA: Diagnosis not present

## 2019-01-30 DIAGNOSIS — N183 Chronic kidney disease, stage 3 (moderate): Secondary | ICD-10-CM | POA: Diagnosis not present

## 2019-01-30 DIAGNOSIS — J449 Chronic obstructive pulmonary disease, unspecified: Secondary | ICD-10-CM | POA: Diagnosis not present

## 2019-01-30 DIAGNOSIS — R296 Repeated falls: Secondary | ICD-10-CM | POA: Diagnosis not present

## 2019-01-30 DIAGNOSIS — Z952 Presence of prosthetic heart valve: Secondary | ICD-10-CM | POA: Diagnosis not present

## 2019-01-30 DIAGNOSIS — I69818 Other symptoms and signs involving cognitive functions following other cerebrovascular disease: Secondary | ICD-10-CM | POA: Diagnosis not present

## 2019-01-30 DIAGNOSIS — E1122 Type 2 diabetes mellitus with diabetic chronic kidney disease: Secondary | ICD-10-CM | POA: Diagnosis not present

## 2019-01-30 DIAGNOSIS — I130 Hypertensive heart and chronic kidney disease with heart failure and stage 1 through stage 4 chronic kidney disease, or unspecified chronic kidney disease: Secondary | ICD-10-CM | POA: Diagnosis not present

## 2019-01-31 DIAGNOSIS — R296 Repeated falls: Secondary | ICD-10-CM | POA: Diagnosis not present

## 2019-01-31 DIAGNOSIS — E785 Hyperlipidemia, unspecified: Secondary | ICD-10-CM | POA: Diagnosis not present

## 2019-01-31 DIAGNOSIS — E1122 Type 2 diabetes mellitus with diabetic chronic kidney disease: Secondary | ICD-10-CM | POA: Diagnosis not present

## 2019-01-31 DIAGNOSIS — I130 Hypertensive heart and chronic kidney disease with heart failure and stage 1 through stage 4 chronic kidney disease, or unspecified chronic kidney disease: Secondary | ICD-10-CM | POA: Diagnosis not present

## 2019-01-31 DIAGNOSIS — I5032 Chronic diastolic (congestive) heart failure: Secondary | ICD-10-CM | POA: Diagnosis not present

## 2019-01-31 DIAGNOSIS — N183 Chronic kidney disease, stage 3 (moderate): Secondary | ICD-10-CM | POA: Diagnosis not present

## 2019-01-31 DIAGNOSIS — I69398 Other sequelae of cerebral infarction: Secondary | ICD-10-CM | POA: Diagnosis not present

## 2019-01-31 DIAGNOSIS — M069 Rheumatoid arthritis, unspecified: Secondary | ICD-10-CM | POA: Diagnosis not present

## 2019-01-31 DIAGNOSIS — Z7984 Long term (current) use of oral hypoglycemic drugs: Secondary | ICD-10-CM | POA: Diagnosis not present

## 2019-01-31 DIAGNOSIS — Z7982 Long term (current) use of aspirin: Secondary | ICD-10-CM | POA: Diagnosis not present

## 2019-01-31 DIAGNOSIS — I69818 Other symptoms and signs involving cognitive functions following other cerebrovascular disease: Secondary | ICD-10-CM | POA: Diagnosis not present

## 2019-01-31 DIAGNOSIS — G253 Myoclonus: Secondary | ICD-10-CM | POA: Diagnosis not present

## 2019-01-31 DIAGNOSIS — M6281 Muscle weakness (generalized): Secondary | ICD-10-CM | POA: Diagnosis not present

## 2019-01-31 DIAGNOSIS — J449 Chronic obstructive pulmonary disease, unspecified: Secondary | ICD-10-CM | POA: Diagnosis not present

## 2019-01-31 DIAGNOSIS — Z952 Presence of prosthetic heart valve: Secondary | ICD-10-CM | POA: Diagnosis not present

## 2019-02-01 ENCOUNTER — Telehealth: Payer: Self-pay

## 2019-02-01 DIAGNOSIS — I5032 Chronic diastolic (congestive) heart failure: Secondary | ICD-10-CM | POA: Diagnosis not present

## 2019-02-01 DIAGNOSIS — Z7982 Long term (current) use of aspirin: Secondary | ICD-10-CM | POA: Diagnosis not present

## 2019-02-01 DIAGNOSIS — R296 Repeated falls: Secondary | ICD-10-CM | POA: Diagnosis not present

## 2019-02-01 DIAGNOSIS — N183 Chronic kidney disease, stage 3 (moderate): Secondary | ICD-10-CM | POA: Diagnosis not present

## 2019-02-01 DIAGNOSIS — M6281 Muscle weakness (generalized): Secondary | ICD-10-CM | POA: Diagnosis not present

## 2019-02-01 DIAGNOSIS — I69818 Other symptoms and signs involving cognitive functions following other cerebrovascular disease: Secondary | ICD-10-CM | POA: Diagnosis not present

## 2019-02-01 DIAGNOSIS — Z952 Presence of prosthetic heart valve: Secondary | ICD-10-CM | POA: Diagnosis not present

## 2019-02-01 DIAGNOSIS — E785 Hyperlipidemia, unspecified: Secondary | ICD-10-CM | POA: Diagnosis not present

## 2019-02-01 DIAGNOSIS — J449 Chronic obstructive pulmonary disease, unspecified: Secondary | ICD-10-CM | POA: Diagnosis not present

## 2019-02-01 DIAGNOSIS — M069 Rheumatoid arthritis, unspecified: Secondary | ICD-10-CM | POA: Diagnosis not present

## 2019-02-01 DIAGNOSIS — E1122 Type 2 diabetes mellitus with diabetic chronic kidney disease: Secondary | ICD-10-CM | POA: Diagnosis not present

## 2019-02-01 DIAGNOSIS — Z7984 Long term (current) use of oral hypoglycemic drugs: Secondary | ICD-10-CM | POA: Diagnosis not present

## 2019-02-01 DIAGNOSIS — I69398 Other sequelae of cerebral infarction: Secondary | ICD-10-CM | POA: Diagnosis not present

## 2019-02-01 DIAGNOSIS — G253 Myoclonus: Secondary | ICD-10-CM | POA: Diagnosis not present

## 2019-02-01 DIAGNOSIS — I130 Hypertensive heart and chronic kidney disease with heart failure and stage 1 through stage 4 chronic kidney disease, or unspecified chronic kidney disease: Secondary | ICD-10-CM | POA: Diagnosis not present

## 2019-02-01 NOTE — Telephone Encounter (Signed)
Debbie, PT called stating patient just needed the one time visit, she will not be returning for anymore visits.

## 2019-02-02 DIAGNOSIS — E785 Hyperlipidemia, unspecified: Secondary | ICD-10-CM | POA: Diagnosis not present

## 2019-02-02 DIAGNOSIS — I69818 Other symptoms and signs involving cognitive functions following other cerebrovascular disease: Secondary | ICD-10-CM | POA: Diagnosis not present

## 2019-02-02 DIAGNOSIS — Z7982 Long term (current) use of aspirin: Secondary | ICD-10-CM | POA: Diagnosis not present

## 2019-02-02 DIAGNOSIS — I130 Hypertensive heart and chronic kidney disease with heart failure and stage 1 through stage 4 chronic kidney disease, or unspecified chronic kidney disease: Secondary | ICD-10-CM | POA: Diagnosis not present

## 2019-02-02 DIAGNOSIS — E1122 Type 2 diabetes mellitus with diabetic chronic kidney disease: Secondary | ICD-10-CM | POA: Diagnosis not present

## 2019-02-02 DIAGNOSIS — G253 Myoclonus: Secondary | ICD-10-CM | POA: Diagnosis not present

## 2019-02-02 DIAGNOSIS — J449 Chronic obstructive pulmonary disease, unspecified: Secondary | ICD-10-CM | POA: Diagnosis not present

## 2019-02-02 DIAGNOSIS — R296 Repeated falls: Secondary | ICD-10-CM | POA: Diagnosis not present

## 2019-02-02 DIAGNOSIS — M069 Rheumatoid arthritis, unspecified: Secondary | ICD-10-CM | POA: Diagnosis not present

## 2019-02-02 DIAGNOSIS — N183 Chronic kidney disease, stage 3 (moderate): Secondary | ICD-10-CM | POA: Diagnosis not present

## 2019-02-02 DIAGNOSIS — Z952 Presence of prosthetic heart valve: Secondary | ICD-10-CM | POA: Diagnosis not present

## 2019-02-02 DIAGNOSIS — Z7984 Long term (current) use of oral hypoglycemic drugs: Secondary | ICD-10-CM | POA: Diagnosis not present

## 2019-02-02 DIAGNOSIS — I5032 Chronic diastolic (congestive) heart failure: Secondary | ICD-10-CM | POA: Diagnosis not present

## 2019-02-02 DIAGNOSIS — I69398 Other sequelae of cerebral infarction: Secondary | ICD-10-CM | POA: Diagnosis not present

## 2019-02-02 DIAGNOSIS — M6281 Muscle weakness (generalized): Secondary | ICD-10-CM | POA: Diagnosis not present

## 2019-02-03 DIAGNOSIS — D500 Iron deficiency anemia secondary to blood loss (chronic): Secondary | ICD-10-CM | POA: Diagnosis not present

## 2019-02-03 DIAGNOSIS — E1122 Type 2 diabetes mellitus with diabetic chronic kidney disease: Secondary | ICD-10-CM | POA: Diagnosis not present

## 2019-02-03 DIAGNOSIS — I69398 Other sequelae of cerebral infarction: Secondary | ICD-10-CM | POA: Diagnosis not present

## 2019-02-03 DIAGNOSIS — E785 Hyperlipidemia, unspecified: Secondary | ICD-10-CM | POA: Diagnosis not present

## 2019-02-03 DIAGNOSIS — I5032 Chronic diastolic (congestive) heart failure: Secondary | ICD-10-CM | POA: Diagnosis not present

## 2019-02-03 DIAGNOSIS — M6281 Muscle weakness (generalized): Secondary | ICD-10-CM | POA: Diagnosis not present

## 2019-02-03 DIAGNOSIS — N183 Chronic kidney disease, stage 3 (moderate): Secondary | ICD-10-CM | POA: Diagnosis not present

## 2019-02-03 DIAGNOSIS — G253 Myoclonus: Secondary | ICD-10-CM | POA: Diagnosis not present

## 2019-02-03 DIAGNOSIS — M069 Rheumatoid arthritis, unspecified: Secondary | ICD-10-CM | POA: Diagnosis not present

## 2019-02-03 DIAGNOSIS — I639 Cerebral infarction, unspecified: Secondary | ICD-10-CM | POA: Diagnosis not present

## 2019-02-03 DIAGNOSIS — Z7982 Long term (current) use of aspirin: Secondary | ICD-10-CM | POA: Diagnosis not present

## 2019-02-03 DIAGNOSIS — R0789 Other chest pain: Secondary | ICD-10-CM | POA: Diagnosis not present

## 2019-02-03 DIAGNOSIS — J449 Chronic obstructive pulmonary disease, unspecified: Secondary | ICD-10-CM | POA: Diagnosis not present

## 2019-02-03 DIAGNOSIS — I69818 Other symptoms and signs involving cognitive functions following other cerebrovascular disease: Secondary | ICD-10-CM | POA: Diagnosis not present

## 2019-02-03 DIAGNOSIS — Z952 Presence of prosthetic heart valve: Secondary | ICD-10-CM | POA: Diagnosis not present

## 2019-02-03 DIAGNOSIS — I130 Hypertensive heart and chronic kidney disease with heart failure and stage 1 through stage 4 chronic kidney disease, or unspecified chronic kidney disease: Secondary | ICD-10-CM | POA: Diagnosis not present

## 2019-02-03 DIAGNOSIS — Z7984 Long term (current) use of oral hypoglycemic drugs: Secondary | ICD-10-CM | POA: Diagnosis not present

## 2019-02-03 DIAGNOSIS — R296 Repeated falls: Secondary | ICD-10-CM | POA: Diagnosis not present

## 2019-02-03 DIAGNOSIS — E1129 Type 2 diabetes mellitus with other diabetic kidney complication: Secondary | ICD-10-CM | POA: Diagnosis not present

## 2019-02-06 DIAGNOSIS — Z7984 Long term (current) use of oral hypoglycemic drugs: Secondary | ICD-10-CM | POA: Diagnosis not present

## 2019-02-06 DIAGNOSIS — E785 Hyperlipidemia, unspecified: Secondary | ICD-10-CM | POA: Diagnosis not present

## 2019-02-06 DIAGNOSIS — J449 Chronic obstructive pulmonary disease, unspecified: Secondary | ICD-10-CM | POA: Diagnosis not present

## 2019-02-06 DIAGNOSIS — E1122 Type 2 diabetes mellitus with diabetic chronic kidney disease: Secondary | ICD-10-CM | POA: Diagnosis not present

## 2019-02-06 DIAGNOSIS — I69398 Other sequelae of cerebral infarction: Secondary | ICD-10-CM | POA: Diagnosis not present

## 2019-02-06 DIAGNOSIS — R296 Repeated falls: Secondary | ICD-10-CM | POA: Diagnosis not present

## 2019-02-06 DIAGNOSIS — I69818 Other symptoms and signs involving cognitive functions following other cerebrovascular disease: Secondary | ICD-10-CM | POA: Diagnosis not present

## 2019-02-06 DIAGNOSIS — I5032 Chronic diastolic (congestive) heart failure: Secondary | ICD-10-CM | POA: Diagnosis not present

## 2019-02-06 DIAGNOSIS — G253 Myoclonus: Secondary | ICD-10-CM | POA: Diagnosis not present

## 2019-02-06 DIAGNOSIS — M6281 Muscle weakness (generalized): Secondary | ICD-10-CM | POA: Diagnosis not present

## 2019-02-06 DIAGNOSIS — Z952 Presence of prosthetic heart valve: Secondary | ICD-10-CM | POA: Diagnosis not present

## 2019-02-06 DIAGNOSIS — N183 Chronic kidney disease, stage 3 (moderate): Secondary | ICD-10-CM | POA: Diagnosis not present

## 2019-02-06 DIAGNOSIS — Z7982 Long term (current) use of aspirin: Secondary | ICD-10-CM | POA: Diagnosis not present

## 2019-02-06 DIAGNOSIS — I130 Hypertensive heart and chronic kidney disease with heart failure and stage 1 through stage 4 chronic kidney disease, or unspecified chronic kidney disease: Secondary | ICD-10-CM | POA: Diagnosis not present

## 2019-02-06 DIAGNOSIS — M069 Rheumatoid arthritis, unspecified: Secondary | ICD-10-CM | POA: Diagnosis not present

## 2019-02-06 NOTE — Progress Notes (Deleted)
[facility] [place]                                       Cardiology Office Note    Date:  02/06/2019   ID:  [name], DOB 06/11/1946, MRN [id number]  PCP:  Redmond School, MD  Cardiologist: Kate Sable, MD / Dr. Angelena Form & Dr. Roxy Manns (TAVR)  CC: 1 month s/p TAVR  History of Present Illness:  [name] [name] is a 73 y.o. female with a history of HTN, HLD, uncontrolled hypothyroidism, DMT2, chronic bronchitis, CKD stage III, RA and severe aortic stenosis s/p TAVR (01/10/19) who presents to clinic for follow up.   Patient states that she has known of presence of a heart murmur for several years.Patient lives a somewhat sedentary lifestyle and has limited physical mobility. She has been disabled since 2012 when she underwent her third and most recent back surgery. She remains on chronic opioid pain relievers for chronic back pain. She has a long history of poor exercise tolerance and underwent an exercise treadmill test in 2019 that was felt to be nondiagnostic. She underwent routine transthoracic echocardiogram on July 13, 2018 which revealed normal left ventricular systolic function with ejection fraction estimated 50-55%. There was aortic stenosis that was felt to possibly represent paradoxical low gradient aortic stenosis with peak velocity across the aortic valve measured 2.85 m/s corresponding to mean transvalvular gradient estimated 20 mmHg.She was referred to the multidisciplinary heart valve clinic and evaluated by Dr. Angelena Form on August 04, 2018. She was scheduled for elective diagnostic cardiac catheterization, but she presented acutely with shortness of breath on August 09, 2018. She was hospitalized for nearly a week and treated for an acute exacerbation of chronic diastolic congestive heart failure. She was notably hypertensive during admission and she developed acute kidney injury. Antihypertensive medications were  adjusted. She was also notably severely hypothyroid with TSH level measured 116. Prior to that she apparently had not been taking her prescribed dose of Synthroid. Synthroid was resumed and she underwent diagnostic cardiac catheterization during her admission on August 11, 2018. She was found to have mild to moderate nonobstructive coronary artery disease. Peak to peak and mean transvalvular gradients measured at catheterization were reported 16 and 19.5 mmHg, respectively, corresponding to aortic valve area calculated 1.09 cm. The patient was noted to have poor dentition during her admission and evaluated by Dr. Lawana Chambers.She underwent CT angiography and was discharged home. She later underwent elective dental extraction August 17, 2018. She recovered from dental extraction uneventfully and was seen in follow-up last month by [name].She was referred for elective surgical consultation.Follow-up echocardiogram performed January 04, 2019 revealed significant progression of disease with severe aortic stenosis and normal left ventricular systolic function. Peak velocity across aortic valve had increased to 3.8 m/s corresponding to mean transvalvular gradient estimated 32 mmHg and DVI reported 0.23 with aortic valve area estimated 0.71 cm.  She underwent successful TAVR with a32mm Edwards Sapien 3 THV via the TF approach on 01/10/19. Post operative echoshowed EF 60% with normally functioning TAVR with mean gradient 11 mmHg and mild PVL. Hospital course was complicated by lethargy, CVA and AKI. 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 CTA head/neck was negative for emergent large vessel occlusion and showed moderate to severe right-ICA and moderate  left-ICA stenosis. She was discharged to inpatient rehab where she continued to have issues with lethargy and most of her neuro sedating drugs were  discontinued.   Today she presents to clinic for follow up.   Past Medical History:  Diagnosis Date  . Anemia   . Anxiety   . Arthritis   . Blood transfusion without reported diagnosis   . Bronchitis   . CHF (congestive heart failure) ([place])   . CKD (chronic kidney disease) stage 3, GFR 30-59 ml/min (HCC) 08/18/2018  . Depression   . Diabetes mellitus   . Diverticulitis   . Glaucoma   . Hyperlipidemia   . Hypertension   . Hypothyroidism   . Macular degeneration   . Melanoma ([place])    left thigh, 2010  . Osteoarthritis   . Peptic ulcer   . Rheumatoid arthritis(714.0)   . 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 Severe aortic stenosis     Past Surgical History:  Procedure Laterality Date  . ABDOMINAL HYSTERECTOMY  1988  . APPENDECTOMY    . BACK SURGERY  07/2008   fusion  . BACK SURGERY  03/8545   complicated by post-op anemia requiring transfusion  . BREAST SURGERY     right breast biopsy  . CARDIAC CATHETERIZATION    . CATARACT EXTRACTION    . CHOLECYSTECTOMY  1974  . COLONOSCOPY  06/06/2007   Proximal diminutive rectal polyps cold biopsied/removed, otherwise normal rectum/ Left-sided diverticula, the remainder of the colonic mucosa appeared normal. Path-tubular adnemoa.   . COLONOSCOPY  09/14/2012   EVO:[id number] polyps-removed as described  above Colonic diverticulosis. Poor prep. next TCS five years.   . COLONOSCOPY N/A 10/29/2016   Procedure: COLONOSCOPY;  Surgeon: Daneil Dolin, MD;  [facility];  Service: Endoscopy;  Laterality: N/A;  2:00 pm  . ESOPHAGOGASTRODUODENOSCOPY  09/2012   Rourk: gastric ulcer, gastric duodenal erosions, small hh. benign bx  . ESOPHAGOGASTRODUODENOSCOPY N/A 10/29/2016   Procedure: ESOPHAGOGASTRODUODENOSCOPY (EGD);  Surgeon: Daneil Dolin, MD;  [facility];  Service: Endoscopy;  Laterality:  N/A;  . EYE SURGERY    . FOOT SURGERY  1994   left   . LUMBAR SPINE SURGERY  08/04/2014   dr Joya Salm  . macular degen    . MALONEY DILATION N/A 10/29/2016   Procedure: Venia Minks DILATION;  Surgeon: Daneil Dolin, MD;  [facility];  Service: Endoscopy;  Laterality: N/A;  . MELANOMA EXCISION  2010  . MULTIPLE EXTRACTIONS WITH ALVEOLOPLASTY N/A 08/17/2018   Procedure: Extraction of tooth #'s 7,8, and 9 with alveoloplasty and gross debridement of remaining teeth;  Surgeon: Lenn Cal, DDS;  Location: [facility];  Service: Oral Surgery;  Laterality: N/A;  . RIGHT/LEFT HEART CATH AND CORONARY ANGIOGRAPHY N/A 08/11/2018   Procedure: RIGHT/LEFT HEART CATH AND CORONARY ANGIOGRAPHY;  Surgeon: Burnell Blanks, MD;  Location: [facility];  Service: Cardiovascular;  Laterality: N/A;  . TEE WITHOUT CARDIOVERSION N/A 01/10/2019   Procedure: TRANSESOPHAGEAL ECHOCARDIOGRAM (TEE);  Surgeon: Burnell Blanks, MD;  Location: [facility];  Service: Open Heart Surgery;  Laterality: N/A;  . TRANSCATHETER AORTIC VALVE REPLACEMENT, TRANSFEMORAL N/A 01/10/2019   Procedure: TRANSCATHETER AORTIC VALVE REPLACEMENT, TRANSFEMORAL;  Surgeon: Burnell Blanks, MD;  Location: [facility];  Service: Open Heart Surgery;  Laterality: N/A;  . TRANSCATHETER AORTIC VALVE REPLACEMENT, TRANSFEMORAL  12/2018  . YAG LASER APPLICATION     OS    Current Medications: Outpatient Medications Prior to Visit  Medication Sig Dispense Refill  . acetaminophen (TYLENOL) 325 MG tablet Take 1-2 tablets (325-650 mg total) by mouth every 4 (four) hours as needed for mild pain.    Marland Kitchen ALPRAZolam (XANAX) 0.5 MG tablet Take 0.5 mg by mouth at bedtime as needed for sleep.     Marland Kitchen amLODipine (NORVASC) 10 MG tablet Take 1 tablet (10 mg total) by mouth at bedtime. 30 tablet 0  . aspirin EC 81 MG tablet Take 81 mg by mouth daily.     Marland Kitchen atorvastatin (LIPITOR) 20 MG tablet Take 1 tablet (20 mg total) by mouth  daily at 6 PM. 30 tablet 0  . beta carotene w/minerals (OCUVITE) tablet Take 1 tablet by mouth daily.    . carvedilol (COREG) 6.25 MG tablet Take 1 tablet (6.25 mg total) by mouth 2 (two) times daily with a meal. 60 tablet 0  . clopidogrel (PLAVIX) 75 MG tablet Take 1 tablet (75 mg total) by mouth daily with breakfast. 30 tablet 0  . ferrous sulfate 325 (65 FE) MG tablet Take 1 tablet (325 mg total) by mouth every other day. 30 tablet 0  . fluticasone (FLONASE) 50 MCG/ACT nasal spray Place into both nostrils daily as needed for allergies or rhinitis.    Marland Kitchen glipiZIDE (GLUCOTROL) 10 MG tablet Take 1 tablet (10 mg total) by mouth 2 (two) times daily before a meal. 60 tablet 0  . latanoprost (XALATAN) 0.005 % ophthalmic solution Place 1 drop into both eyes at bedtime.     Marland Kitchen levothyroxine (SYNTHROID, LEVOTHROID) 88 MCG tablet Take 1 tablet (88 mcg total) by mouth daily. 30 tablet 0  . potassium chloride SA (K-DUR,KLOR-CON) 20 MEQ tablet Take 1 tablet (20 mEq total) by mouth daily. 30 tablet 0  . Prenatal Vit-Fe Fumarate-FA (PRENATAL MULTIVITAMIN) TABS Take 1 tablet by mouth daily.    . timolol (TIMOPTIC) 0.5 % ophthalmic solution Place 1 drop into both eyes 2 (two) times daily.     Marland Kitchen trolamine salicylate (ASPERCREME) 10 % cream Apply 1 application topically as needed for muscle pain.    . VENTOLIN HFA 108 (90 Base) MCG/ACT inhaler Inhale 2 puffs into the lungs every 4 (four) hours as needed for wheezing or shortness of breath.   0  . vitamin B-12 (CYANOCOBALAMIN) 1000 MCG tablet Take 1,000 mcg by mouth daily.    . vitamin C (ASCORBIC ACID) 500 MG tablet Take 500 mg by mouth daily.     No facility-administered medications prior to visit.      Allergies:   Patient has no known allergies.   Social History   Socioeconomic History  . Marital status: Widowed    Spouse name: Not on file  . Number of children: 2  . Years of education: Not on file  . Highest education level: Not on file  Occupational  History  . Not on file  Social Needs  . Financial resource strain: Somewhat hard  . Food insecurity:    Worry: Sometimes true    Inability: Sometimes true  . Transportation needs:    Medical: No    Non-medical: Yes  Tobacco Use  . Smoking status: Never Smoker  . Smokeless tobacco: Never Used  Substance and Sexual Activity  . Alcohol use: No    Comment: "Wine a couple times a month"  . Drug use: No  . Sexual activity: Yes  Birth control/protection: Surgical  Lifestyle  . Physical activity:    Days per week: 7 days    Minutes per session: 60 min  . Stress: Only a little  Relationships  . Social connections:    Talks on phone: More than three times a week    Gets together: More than three times a week    Attends religious service: Not on file    Active member of club or organization: Not on file    Attends meetings of clubs or organizations: Not on file    Relationship status: Not on file  Other Topics Concern  . Not on file  Social History Narrative   Husband deceased 04-18-2011.      Family History:  The patient's ***family history includes Bipolar disorder in her daughter; Breast cancer in her sister; Colon cancer in her brother; Diabetes in her brother, mother, sister, and sister; Glaucoma in her mother; Heart attack in her brother; Heart failure in her father and mother; Hypertension in her daughter, mother, sister, sister, sister, and another family member; Other in her sister and another family member.      *** ROS/PE    Wt Readings from Last 3 Encounters:  01/25/19 142 lb 3.2 oz (64.5 kg)  01/16/19 142 lb 3.2 oz (64.5 kg)  01/09/19 141 lb 12.1 oz (64.3 kg)      Studies/Labs Reviewed:   EKG:  EKG is*** ordered today.  The ekg ordered today demonstrates ***  Recent Labs: 12/21/2018: TSH 31.420 01/09/2019: B Natriuretic Peptide 129.6 01/11/2019: Magnesium 2.3 01/17/2019: ALT 11 01/24/2019: Hemoglobin 8.7; Platelets 279 01/25/2019: BUN 29; Creatinine, Ser 1.55;  Potassium 3.7; Sodium 137   Lipid Panel    Component Value Date/Time   CHOL 163 01/14/2019 0415   TRIG 133 01/14/2019 0415   HDL 47 01/14/2019 0415   CHOLHDL 3.5 01/14/2019 0415   VLDL 27 01/14/2019 0415   LDLCALC 89 01/14/2019 0415    Additional studies/ records that were reviewed today include:  TAVR OPERATIVE NOTE   Date of Procedure:01/10/2019  Preoperative Diagnosis:Severe Aortic Stenosis   Postoperative Diagnosis:Same   Procedure:   Transcatheter Aortic Valve Replacement - PercutaneousRightTransfemoral Approach Edwards Sapien 3 THV (size 64mm, model # 9600TFX, serial # [id number])  Co-Surgeons:Christopher Angelena Form, MD andClarence H. Roxy Manns, MD  Anesthesiologist:Thomas Fransisco Beau, MD  Echocardiographer:Mihai Croitoru, MD  Pre-operative Echo Findings: ? Severe aortic stenosis ? Normalleft ventricular systolic function  Post-operative Echo Findings: ? Mildparavalvular leak ? Normalleft ventricular systolic function  _____________  Echo 01/11/2019 IMPRESSIONS: 1. A 37mm Edwards Sapien bioprosthetic aortic valve valve is present in the aortic position. The prosthesis was placed on 01/10/2019. 2. AV Mean Grad:11.0 mmHg. 3. Mild paravalvular regurgitation, no prosthetic regurgitation. of the aortic prosthesis. 4. The left ventricle appears to be normal in size, has mild wall thickness 60-65% ejection fraction Spectral Doppler shows impaired relaxation pattern of diastolic filling. 5. The right ventricle has normal size and normal systolic function. 6. Right ventricular systolic pressure is could not be assessed. 7. Mitral valve regurgitation is trivial by color flow Doppler. 8. MV Mean Dias grad:4.0 mmHg, HR 73 bpm. 9. Pericardium: There is no evidence of pericardial effusion.  ________________  MRI Head 01/13/19  IMPRESSION: At least 10  small areas of acute infarct in both cerebral hemispheres most compatible with cerebral emboli.  Atrophy and mild chronic microvascular ischemia. Chronic microhemorrhage in the pons likely due to hypertension.  ________________   CTA neck 01/13/19  IMPRESSION: CTA NECK: 1. No hemodynamically  significant stenosis ICA's. Patent vertebral arteries. CTA HEAD: 1. No emergent large vessel occlusion. 2. Advanced intracranial atherosclerosis with moderate to severe RIGHT and moderate LEFT ICA stenosis.    ASSESSMENT & PLAN:   Severe AS s/p TAVR:  HTN:   CKD stage III:   Embolic CVA:   Advanced intracranial atherosclerosis: during stroke work up, she was found to have moderate to severe RIGHT and moderate LEFT ICA stenosis. Vascular follow up with need to be arranged.   Medication Adjustments/Labs and Tests Ordered: Current medicines are reviewed at length with the patient today.  Concerns regarding medicines are outlined above.  Medication changes, Labs and Tests ordered today are listed in the Patient Instructions below. There are no Patient Instructions on file for this visit.   Mable Fill, PA-C  02/06/2019 8:17 AM    [facility] [address], [place], [place]  [address] Phone: [phone]; Fax: [phone]

## 2019-02-07 ENCOUNTER — Encounter: Payer: Medicare Other | Attending: Registered Nurse | Admitting: Registered Nurse

## 2019-02-08 ENCOUNTER — Other Ambulatory Visit (HOSPITAL_COMMUNITY): Payer: Self-pay

## 2019-02-08 ENCOUNTER — Ambulatory Visit: Payer: Medicare Other | Admitting: Physician Assistant

## 2019-02-08 DIAGNOSIS — Z7984 Long term (current) use of oral hypoglycemic drugs: Secondary | ICD-10-CM | POA: Diagnosis not present

## 2019-02-08 DIAGNOSIS — M069 Rheumatoid arthritis, unspecified: Secondary | ICD-10-CM | POA: Diagnosis not present

## 2019-02-08 DIAGNOSIS — I69398 Other sequelae of cerebral infarction: Secondary | ICD-10-CM | POA: Diagnosis not present

## 2019-02-08 DIAGNOSIS — G253 Myoclonus: Secondary | ICD-10-CM | POA: Diagnosis not present

## 2019-02-08 DIAGNOSIS — J449 Chronic obstructive pulmonary disease, unspecified: Secondary | ICD-10-CM | POA: Diagnosis not present

## 2019-02-08 DIAGNOSIS — E1122 Type 2 diabetes mellitus with diabetic chronic kidney disease: Secondary | ICD-10-CM | POA: Diagnosis not present

## 2019-02-08 DIAGNOSIS — Z7982 Long term (current) use of aspirin: Secondary | ICD-10-CM | POA: Diagnosis not present

## 2019-02-08 DIAGNOSIS — E785 Hyperlipidemia, unspecified: Secondary | ICD-10-CM | POA: Diagnosis not present

## 2019-02-08 DIAGNOSIS — I69818 Other symptoms and signs involving cognitive functions following other cerebrovascular disease: Secondary | ICD-10-CM | POA: Diagnosis not present

## 2019-02-08 DIAGNOSIS — M6281 Muscle weakness (generalized): Secondary | ICD-10-CM | POA: Diagnosis not present

## 2019-02-08 DIAGNOSIS — N183 Chronic kidney disease, stage 3 (moderate): Secondary | ICD-10-CM | POA: Diagnosis not present

## 2019-02-08 DIAGNOSIS — R0989 Other specified symptoms and signs involving the circulatory and respiratory systems: Secondary | ICD-10-CM

## 2019-02-08 DIAGNOSIS — I5032 Chronic diastolic (congestive) heart failure: Secondary | ICD-10-CM | POA: Diagnosis not present

## 2019-02-08 DIAGNOSIS — I130 Hypertensive heart and chronic kidney disease with heart failure and stage 1 through stage 4 chronic kidney disease, or unspecified chronic kidney disease: Secondary | ICD-10-CM | POA: Diagnosis not present

## 2019-02-08 DIAGNOSIS — R296 Repeated falls: Secondary | ICD-10-CM | POA: Diagnosis not present

## 2019-02-08 DIAGNOSIS — Z952 Presence of prosthetic heart valve: Secondary | ICD-10-CM | POA: Diagnosis not present

## 2019-02-09 ENCOUNTER — Encounter: Payer: Self-pay | Admitting: Physician Assistant

## 2019-02-10 DIAGNOSIS — G253 Myoclonus: Secondary | ICD-10-CM | POA: Diagnosis not present

## 2019-02-10 DIAGNOSIS — Z7984 Long term (current) use of oral hypoglycemic drugs: Secondary | ICD-10-CM | POA: Diagnosis not present

## 2019-02-10 DIAGNOSIS — R296 Repeated falls: Secondary | ICD-10-CM | POA: Diagnosis not present

## 2019-02-10 DIAGNOSIS — I69398 Other sequelae of cerebral infarction: Secondary | ICD-10-CM | POA: Diagnosis not present

## 2019-02-10 DIAGNOSIS — J449 Chronic obstructive pulmonary disease, unspecified: Secondary | ICD-10-CM | POA: Diagnosis not present

## 2019-02-10 DIAGNOSIS — M069 Rheumatoid arthritis, unspecified: Secondary | ICD-10-CM | POA: Diagnosis not present

## 2019-02-10 DIAGNOSIS — I5032 Chronic diastolic (congestive) heart failure: Secondary | ICD-10-CM | POA: Diagnosis not present

## 2019-02-10 DIAGNOSIS — E785 Hyperlipidemia, unspecified: Secondary | ICD-10-CM | POA: Diagnosis not present

## 2019-02-10 DIAGNOSIS — I69818 Other symptoms and signs involving cognitive functions following other cerebrovascular disease: Secondary | ICD-10-CM | POA: Diagnosis not present

## 2019-02-10 DIAGNOSIS — Z952 Presence of prosthetic heart valve: Secondary | ICD-10-CM | POA: Diagnosis not present

## 2019-02-10 DIAGNOSIS — E1122 Type 2 diabetes mellitus with diabetic chronic kidney disease: Secondary | ICD-10-CM | POA: Diagnosis not present

## 2019-02-10 DIAGNOSIS — Z7982 Long term (current) use of aspirin: Secondary | ICD-10-CM | POA: Diagnosis not present

## 2019-02-10 DIAGNOSIS — M6281 Muscle weakness (generalized): Secondary | ICD-10-CM | POA: Diagnosis not present

## 2019-02-10 DIAGNOSIS — I130 Hypertensive heart and chronic kidney disease with heart failure and stage 1 through stage 4 chronic kidney disease, or unspecified chronic kidney disease: Secondary | ICD-10-CM | POA: Diagnosis not present

## 2019-02-10 DIAGNOSIS — N183 Chronic kidney disease, stage 3 (moderate): Secondary | ICD-10-CM | POA: Diagnosis not present

## 2019-02-14 DIAGNOSIS — R296 Repeated falls: Secondary | ICD-10-CM | POA: Diagnosis not present

## 2019-02-14 DIAGNOSIS — G253 Myoclonus: Secondary | ICD-10-CM | POA: Diagnosis not present

## 2019-02-14 DIAGNOSIS — I5032 Chronic diastolic (congestive) heart failure: Secondary | ICD-10-CM | POA: Diagnosis not present

## 2019-02-14 DIAGNOSIS — I69398 Other sequelae of cerebral infarction: Secondary | ICD-10-CM | POA: Diagnosis not present

## 2019-02-14 DIAGNOSIS — J449 Chronic obstructive pulmonary disease, unspecified: Secondary | ICD-10-CM | POA: Diagnosis not present

## 2019-02-14 DIAGNOSIS — M069 Rheumatoid arthritis, unspecified: Secondary | ICD-10-CM | POA: Diagnosis not present

## 2019-02-14 DIAGNOSIS — I69818 Other symptoms and signs involving cognitive functions following other cerebrovascular disease: Secondary | ICD-10-CM | POA: Diagnosis not present

## 2019-02-14 DIAGNOSIS — E1122 Type 2 diabetes mellitus with diabetic chronic kidney disease: Secondary | ICD-10-CM | POA: Diagnosis not present

## 2019-02-14 DIAGNOSIS — Z952 Presence of prosthetic heart valve: Secondary | ICD-10-CM | POA: Diagnosis not present

## 2019-02-14 DIAGNOSIS — E785 Hyperlipidemia, unspecified: Secondary | ICD-10-CM | POA: Diagnosis not present

## 2019-02-14 DIAGNOSIS — Z7982 Long term (current) use of aspirin: Secondary | ICD-10-CM | POA: Diagnosis not present

## 2019-02-14 DIAGNOSIS — N183 Chronic kidney disease, stage 3 (moderate): Secondary | ICD-10-CM | POA: Diagnosis not present

## 2019-02-14 DIAGNOSIS — I130 Hypertensive heart and chronic kidney disease with heart failure and stage 1 through stage 4 chronic kidney disease, or unspecified chronic kidney disease: Secondary | ICD-10-CM | POA: Diagnosis not present

## 2019-02-14 DIAGNOSIS — M6281 Muscle weakness (generalized): Secondary | ICD-10-CM | POA: Diagnosis not present

## 2019-02-14 DIAGNOSIS — Z7984 Long term (current) use of oral hypoglycemic drugs: Secondary | ICD-10-CM | POA: Diagnosis not present

## 2019-02-20 ENCOUNTER — Encounter: Payer: Self-pay | Admitting: Physician Assistant

## 2019-02-22 DIAGNOSIS — I5032 Chronic diastolic (congestive) heart failure: Secondary | ICD-10-CM | POA: Diagnosis not present

## 2019-02-22 DIAGNOSIS — I69398 Other sequelae of cerebral infarction: Secondary | ICD-10-CM | POA: Diagnosis not present

## 2019-02-22 DIAGNOSIS — N183 Chronic kidney disease, stage 3 (moderate): Secondary | ICD-10-CM | POA: Diagnosis not present

## 2019-02-22 DIAGNOSIS — E785 Hyperlipidemia, unspecified: Secondary | ICD-10-CM | POA: Diagnosis not present

## 2019-02-22 DIAGNOSIS — M6281 Muscle weakness (generalized): Secondary | ICD-10-CM | POA: Diagnosis not present

## 2019-02-22 DIAGNOSIS — M069 Rheumatoid arthritis, unspecified: Secondary | ICD-10-CM | POA: Diagnosis not present

## 2019-02-22 DIAGNOSIS — G253 Myoclonus: Secondary | ICD-10-CM | POA: Diagnosis not present

## 2019-02-22 DIAGNOSIS — Z7982 Long term (current) use of aspirin: Secondary | ICD-10-CM | POA: Diagnosis not present

## 2019-02-22 DIAGNOSIS — Z7984 Long term (current) use of oral hypoglycemic drugs: Secondary | ICD-10-CM | POA: Diagnosis not present

## 2019-02-22 DIAGNOSIS — R296 Repeated falls: Secondary | ICD-10-CM | POA: Diagnosis not present

## 2019-02-22 DIAGNOSIS — I130 Hypertensive heart and chronic kidney disease with heart failure and stage 1 through stage 4 chronic kidney disease, or unspecified chronic kidney disease: Secondary | ICD-10-CM | POA: Diagnosis not present

## 2019-02-22 DIAGNOSIS — J449 Chronic obstructive pulmonary disease, unspecified: Secondary | ICD-10-CM | POA: Diagnosis not present

## 2019-02-22 DIAGNOSIS — E1122 Type 2 diabetes mellitus with diabetic chronic kidney disease: Secondary | ICD-10-CM | POA: Diagnosis not present

## 2019-02-22 DIAGNOSIS — Z952 Presence of prosthetic heart valve: Secondary | ICD-10-CM | POA: Diagnosis not present

## 2019-02-22 DIAGNOSIS — I69818 Other symptoms and signs involving cognitive functions following other cerebrovascular disease: Secondary | ICD-10-CM | POA: Diagnosis not present

## 2019-02-23 ENCOUNTER — Telehealth: Payer: Self-pay

## 2019-02-23 DIAGNOSIS — G894 Chronic pain syndrome: Secondary | ICD-10-CM | POA: Diagnosis not present

## 2019-02-23 DIAGNOSIS — M159 Polyosteoarthritis, unspecified: Secondary | ICD-10-CM | POA: Diagnosis not present

## 2019-02-23 NOTE — Telephone Encounter (Signed)
[name] [name] missed her 1 mo TAVR appointments. Called her daughter (DPR) to reschedule, but her phone is not accepting calls at this time. Attempted to call [name] [name] but she did not answer.  Left message to call back.

## 2019-02-24 DIAGNOSIS — H353133 Nonexudative age-related macular degeneration, bilateral, advanced atrophic without subfoveal involvement: Secondary | ICD-10-CM | POA: Diagnosis not present

## 2019-02-24 DIAGNOSIS — E119 Type 2 diabetes mellitus without complications: Secondary | ICD-10-CM | POA: Diagnosis not present

## 2019-03-14 ENCOUNTER — Other Ambulatory Visit: Payer: Self-pay | Admitting: Physical Medicine and Rehabilitation

## 2019-03-14 ENCOUNTER — Encounter: Payer: Self-pay | Admitting: Neurology

## 2019-03-14 DIAGNOSIS — I10 Essential (primary) hypertension: Secondary | ICD-10-CM | POA: Diagnosis not present

## 2019-03-14 DIAGNOSIS — R069 Unspecified abnormalities of breathing: Secondary | ICD-10-CM | POA: Diagnosis not present

## 2019-03-16 ENCOUNTER — Other Ambulatory Visit: Payer: Self-pay

## 2019-03-16 ENCOUNTER — Telehealth (INDEPENDENT_AMBULATORY_CARE_PROVIDER_SITE_OTHER): Payer: Medicare Other | Admitting: Physician Assistant

## 2019-03-16 ENCOUNTER — Other Ambulatory Visit: Payer: Self-pay | Admitting: Physician Assistant

## 2019-03-16 DIAGNOSIS — Z952 Presence of prosthetic heart valve: Secondary | ICD-10-CM

## 2019-03-16 DIAGNOSIS — I10 Essential (primary) hypertension: Secondary | ICD-10-CM

## 2019-03-16 DIAGNOSIS — I6529 Occlusion and stenosis of unspecified carotid artery: Secondary | ICD-10-CM

## 2019-03-16 DIAGNOSIS — Z8673 Personal history of transient ischemic attack (TIA), and cerebral infarction without residual deficits: Secondary | ICD-10-CM

## 2019-03-16 MED ORDER — AMOXICILLIN 500 MG PO TABS: 2000.0000 mg | ORAL_TABLET | ORAL | 12 refills | Status: DC

## 2019-03-16 MED ORDER — CLOPIDOGREL BISULFATE 75 MG PO TABS: 75.0000 mg | ORAL_TABLET | Freq: Every day | ORAL | 0 refills | Status: DC

## 2019-03-16 NOTE — Patient Instructions (Addendum)
Hello [name] [name],   It was so nice to talk to you on the phone today. I am so glad you are doing so well. I just wanted to send you a recap of our discussion.   Please continue taking antibiotics prior to any dental work including cleanings; amoxicillin has been called into your pharmacy and directions are on the bottle. You should resume taking plavix. This has been called into your pharmacy for a 90 day supply. You can stop plavix when the pills run out. Please continue on aspirin 81 mg indefinitely.   You will be seen back by your primary cardiologist, Dr. Bronson Ing in August. Your 1 month echo will be rescheduled to before or after this appointment. Someone will call you with this information. I have also placed a referral to vascular surgery to follow your intracranial carotid stenosis (blockages in the blood vessels that feed your brain.) Someone should be in touch to get an appointment set up.   I will see you back in 1 years time with an echo.   All appointment details are attached to this letter in your "after visit summary."  Please call us with any questions or concerns you may have and please stay safe during these uncertain times.  Nell Range

## 2019-03-16 NOTE — Telephone Encounter (Signed)
The patient is scheduled for a virtual visit with K Grandville Silos 4/30.

## 2019-03-16 NOTE — Progress Notes (Signed)
[facility]   MULTIDISCIPLINARY HEART VALVE TEAM   Evaluation Performed:  Follow-up visit  This visit type was conducted due to national recommendations for restrictions regarding the COVID-19 Pandemic (e.g. social distancing).  This format is felt to be most appropriate for this patient at this time.  All issues noted in this document were discussed and addressed.  No physical exam was performed (except for noted visual exam findings with Telehealth visits).  The patient has consented to conduct a Telehealth visit and understands insurance will be billed.   Date:  03/17/2019   ID:  [name] [name], DOB Sep 23, 1946, MRN [id number]  Patient Location:  [place] [address]   Provider location:   [address] [place], [place] [address]  PCP:  Redmond School, MD  Cardiologist:  Kate Sable, MD / Dr. Angelena Form & Dr. Roxy Manns (TAVR) Electrophysiologist:  None   Chief Complaint:  1 month s/p TAVR  History of Present Illness:    [name] [name] is a 73 y.o. female with a history of HTN, HLD, uncontrolled hypothyroidism, DMT2, chronic bronchitis, CKD stage III, RA and severe aortic stenosis s/p TAVR (01/10/19) who presents via audio conferencing for a telehealth visit today.    The patient does not have symptoms concerning for COVID-19 infection (fever, chills, cough, or new SHORTNESS OF BREATH).   Patient states that she has known of presence of a heart murmur for several years.Patient lives a somewhat sedentary lifestyle and has limited physical mobility. She has been disabled since 2012 when she underwent her third and most recent back surgery. She remains on chronic opioid pain relievers for chronic back pain. She has a long history of poor exercise tolerance and underwent an exercise treadmill test in 2019 that was felt to be nondiagnostic. She underwent routine transthoracic echocardiogram on July 13, 2018 which revealed normal left ventricular systolic  function with ejection fraction estimated 50-55%. There was aortic stenosis that was felt to possibly represent paradoxical low gradient aortic stenosis with peak velocity across the aortic valve measured 2.85 m/s corresponding to mean transvalvular gradient estimated 20 mmHg.She was referred to the multidisciplinary heart valve clinic and evaluated by Dr. Angelena Form on August 04, 2018. She was scheduled for elective diagnostic cardiac catheterization, but she presented acutely with shortness of breath on August 09, 2018. She was hospitalized for nearly a week and treated for an acute exacerbation of chronic diastolic congestive heart failure. She was notably hypertensive during admission and she developed acute kidney injury. Antihypertensive medications were adjusted. She was also notably severely hypothyroid with TSH level measured 116. Prior to that she apparently had not been taking her prescribed dose of Synthroid. Synthroid was resumed and she underwent diagnostic cardiac catheterization during her admission on August 11, 2018. She was found to have mild to moderate nonobstructive coronary artery disease. Peak to peak and mean transvalvular gradients measured at catheterization were reported 16 and 19.5 mmHg, respectively, corresponding to aortic valve area calculated 1.09 cm. The patient was noted to have poor dentition during her admission and evaluated by Dr. Lawana Chambers.She underwent CT angiography and was discharged home. She later underwent elective dental extraction August 17, 2018. She recovered from dental extraction uneventfully and was seen in follow-up last month by [name].She was referred for elective surgical consultation.Follow-up echocardiogram performed January 04, 2019 revealed significant progression of disease with severe aortic stenosis and normal left ventricular systolic function. Peak velocity across aortic valve had increased to 3.8 m/s  corresponding to mean transvalvular gradient estimated 32 mmHg and DVI reported 0.23 with aortic valve area estimated 0.71 cm.   She underwent successful TAVR with a89mm Edwards Sapien 3 THV via the TF approach on 01/10/19. Post operative echoshowed EF 60% with normally functioning TAVR with mean gradient 11 mmHg and mild PVL. Hospital course was complicated by lethargy, CVA and AKI. 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 CTA head/neck was negative for emergent large vessel occlusion and showed moderate to severe right-ICA and moderate left-ICA stenosis. She was discharged to inpatient rehab where she continued to have issues with lethargy and most of her neuro sedating drugs were discontinued.   Today she presents via telephone for follow up. I spoke with her daughter [name] [name] and [name] [name] as well. Her daughter said she is "doing awesome." She has been doing quite well except for a bout of bronchitis. No CP or SOB. No LE edema, orthopnea or PND. No dizziness or syncope. No blood in stool or urine. No palpitations.    Prior CV studies:   The following studies were reviewed today:   TAVR OPERATIVE NOTE   Date of Procedure:01/10/2019  Preoperative Diagnosis:Severe Aortic Stenosis   Postoperative Diagnosis:Same   Procedure:   Transcatheter Aortic Valve Replacement - PercutaneousRightTransfemoral Approach Edwards Sapien 3 THV (size 22mm, model # 9600TFX, serial # [id number])  Co-Surgeons:Christopher Angelena Form, MD andClarence H. Roxy Manns, MD  Anesthesiologist:Thomas Fransisco Beau, MD  Echocardiographer:Mihai Croitoru, MD  Pre-operative Echo Findings: ? Severe aortic stenosis ? Normalleft ventricular systolic function  Post-operative Echo Findings: ? Mildparavalvular leak ? Normalleft ventricular systolic  function  _____________  Echo 01/11/2019 IMPRESSIONS: 1. A 72mm Edwards Sapien bioprosthetic aortic valve valve is present in the aortic position. The prosthesis was placed on 01/10/2019. 2. AV Mean Grad:11.0 mmHg. 3. Mild paravalvular regurgitation, no prosthetic regurgitation. of the aortic prosthesis. 4. The left ventricle appears to be normal in size, has mild wall thickness 60-65% ejection fraction Spectral Doppler shows impaired relaxation pattern of diastolic filling. 5. The right ventricle has normal size and normal systolic function. 6. Right ventricular systolic pressure is could not be assessed. 7. Mitral valve regurgitation is trivial by color flow Doppler. 8. MV Mean Dias grad:4.0 mmHg, HR 73 bpm. 9. Pericardium: There is no evidence of pericardial effusion.  ________________  MRI Head 01/13/19  IMPRESSION: At least 10 small areas of acute infarct in both cerebral hemispheres most compatible with cerebral emboli.  Atrophy and mild chronic microvascular ischemia. Chronic microhemorrhage in the pons likely due to hypertension.  ________________   CTA neck 01/13/19  IMPRESSION: CTA NECK: 1. No hemodynamically significant stenosis ICA's. Patent vertebral arteries. CTA HEAD: 1. No emergent large vessel occlusion. 2. Advanced intracranial atherosclerosis with moderate to severe RIGHT and moderate LEFT ICA stenosis.   Past Medical History:  Diagnosis Date   Anemia    Anxiety    Arthritis    Blood transfusion without reported diagnosis    Bronchitis    CHF (congestive heart failure) ([place])    CKD (chronic kidney disease) stage 3, GFR 30-59 ml/min ([place]) 08/18/2018   Depression    Diabetes mellitus    Diverticulitis    Glaucoma    Hyperlipidemia    Hypertension    Hypothyroidism    Macular degeneration    Melanoma ([place])    left thigh, 2010   Osteoarthritis    Peptic ulcer    Rheumatoid arthritis(714.0)    S/P TAVR  (  transcatheter aortic valve replacement) 01/10/2019   23 mm Edwards Sapien 3 transcatheter heart valve placed via percutaneous right transfemoral approach    Severe aortic stenosis    Past Surgical History:  Procedure Laterality Date   ABDOMINAL HYSTERECTOMY  1988   APPENDECTOMY     BACK SURGERY  07/2008   fusion   BACK SURGERY  02/5008   complicated by post-op anemia requiring transfusion   BREAST SURGERY     right breast biopsy   CARDIAC CATHETERIZATION     CATARACT EXTRACTION     CHOLECYSTECTOMY  1974   COLONOSCOPY  06/06/2007   Proximal diminutive rectal polyps cold biopsied/removed, otherwise normal rectum/ Left-sided diverticula, the remainder of the colonic mucosa appeared normal. Path-tubular adnemoa.    COLONOSCOPY  09/14/2012   [facility]:WEXHBZ polyps-removed as described  above Colonic diverticulosis. Poor prep. next TCS five years.    COLONOSCOPY N/A 10/29/2016   Procedure: COLONOSCOPY;  Surgeon: Daneil Dolin, MD;  [facility];  Service: Endoscopy;  Laterality: N/A;  2:00 pm   ESOPHAGOGASTRODUODENOSCOPY  09/2012   [place]k: gastric ulcer, gastric duodenal erosions, small hh. benign bx   ESOPHAGOGASTRODUODENOSCOPY N/A 10/29/2016   Procedure: ESOPHAGOGASTRODUODENOSCOPY (EGD);  Surgeon: Daneil Dolin, MD;  [facility];  Service: Endoscopy;  Laterality: N/A;   EYE SURGERY     FOOT SURGERY  1994   left    LUMBAR SPINE SURGERY  08/04/2014   dr Joya Salm   macular degen     MALONEY DILATION N/A 10/29/2016   Procedure: Venia Minks DILATION;  Surgeon: Daneil Dolin, MD;  [facility];  Service: Endoscopy;  Laterality: N/A;   MELANOMA EXCISION  2010   MULTIPLE EXTRACTIONS WITH ALVEOLOPLASTY N/A 08/17/2018   Procedure: Extraction of tooth #'s 7,8, and 9 with alveoloplasty and gross debridement of remaining teeth;  Surgeon: Lenn Cal, DDS;  Location: [facility];  Service: Oral Surgery;  Laterality: N/A;   RIGHT/LEFT HEART CATH AND  CORONARY ANGIOGRAPHY N/A 08/11/2018   Procedure: RIGHT/LEFT HEART CATH AND CORONARY ANGIOGRAPHY;  Surgeon: Burnell Blanks, MD;  Location: [facility];  Service: Cardiovascular;  Laterality: N/A;   TEE WITHOUT CARDIOVERSION N/A 01/10/2019   Procedure: TRANSESOPHAGEAL ECHOCARDIOGRAM (TEE);  Surgeon: Burnell Blanks, MD;  Location: [facility];  Service: Open Heart Surgery;  Laterality: N/A;   TRANSCATHETER AORTIC VALVE REPLACEMENT, TRANSFEMORAL N/A 01/10/2019   Procedure: TRANSCATHETER AORTIC VALVE REPLACEMENT, TRANSFEMORAL;  Surgeon: Burnell Blanks, MD;  Location: [facility];  Service: Open Heart Surgery;  Laterality: N/A;   TRANSCATHETER AORTIC VALVE REPLACEMENT, TRANSFEMORAL  16/9678   YAG LASER APPLICATION     OS     No outpatient medications have been marked as taking for the 03/16/19 encounter (Telemedicine) with Eileen Stanford, PA-C.     Allergies:   Patient has no known allergies.   Social History   Tobacco Use   Smoking status: Never Smoker   Smokeless tobacco: Never Used  Substance Use Topics   Alcohol use: No    Comment: "Wine a couple times a month"   Drug use: No     Family Hx: The patient's family history includes Bipolar disorder in her daughter; Breast cancer in her sister; Colon cancer in her brother; Diabetes in her brother, mother, sister, and sister; Glaucoma in her mother; Heart attack in her brother; Heart failure in her father and mother; Hypertension in her daughter, mother, sister, sister, sister, and another family member;  Other in her sister and another family member. There is no history of Anesthesia problems, Amblyopia, Blindness, Cataracts, Macular degeneration, Retinal detachment, Strabismus, or Retinitis pigmentosa.  ROS:   Please see the history of present illness.    All other systems reviewed and are negative.   Labs/Other Tests and Data Reviewed:    Recent Labs: 12/21/2018: TSH  31.420 01/09/2019: B Natriuretic Peptide 129.6 01/11/2019: Magnesium 2.3 01/17/2019: ALT 11 01/24/2019: Hemoglobin 8.7; Platelets 279 01/25/2019: BUN 29; Creatinine, Ser 1.55; Potassium 3.7; Sodium 137   Recent Lipid Panel Lab Results  Component Value Date/Time   CHOL 163 01/14/2019 04:15 AM   TRIG 133 01/14/2019 04:15 AM   HDL 47 01/14/2019 04:15 AM   CHOLHDL 3.5 01/14/2019 04:15 AM   LDLCALC 89 01/14/2019 04:15 AM    Wt Readings from Last 3 Encounters:  01/25/19 142 lb 3.2 oz (64.5 kg)  01/16/19 142 lb 3.2 oz (64.5 kg)  01/09/19 141 lb 12.1 oz (64.3 kg)     Exam:    There were no vitals filed for this visit.  Not completed as visit conducted over the phone  ASSESSMENT & PLAN:    Severe AS s/p TAVR:she is doing well with NYHA class II symptoms. She has not been taking plavix as she was only given a 30 day supply at discharge from inpatient rehab and did not show up to her original 1 month follow up with me. I will call in a 90 day supply now. She can stop her plavix in 06/2019 and continue on baby aspirin. 1 month echo will be rescheduled due to the covid 19 pandemic. I will see her back in 1 year with an echo. SBE prophylaxis discussed; I have RX'd amoxicillin.   HTN: she doesn't take her BPs regularly at home. 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    Advanced intracranial atherosclerosis: during stroke work up, she was found to have moderate to severe RIGHT and moderate LEFT ICA stenosis. Vascular referral placed.    COVID-19 Education: The signs and symptoms of COVID-19 were discussed with the patient and how to seek care for testing (follow up with PCP or arrange E-visit).  The importance of social distancing was discussed today.  Patient Risk:   After full review of this patients clinical status, I feel that they are at least moderate risk at this time.   Time:   Today, I have spent 15 minutes with the patient with telehealth  technology discussing post surgical recovery, symptoms and instructions going forward.    A video web ex was attempted, but due to technical difficulties it was converted to an audio only visit.   Medication Adjustments/Labs and Tests Ordered: Current medicines are reviewed at length with the patient today.  Concerns regarding medicines are outlined above.  Tests Ordered: Orders Placed This Encounter  Procedures   Ambulatory referral to Vascular Surgery   Medication Changes: Meds ordered this encounter  Medications   clopidogrel (PLAVIX) 75 MG tablet    Sig: Take 1 tablet (75 mg total) by mouth daily with breakfast.    Dispense:  90 tablet    Refill:  0    Order Specific Question:   Supervising Provider    Answer:   COOPER, MICHAEL [[id number]]   amoxicillin (AMOXIL) 500 MG tablet    Sig: Take 4 tablets (2,000 mg total) by mouth as directed. Take 4 tablets 1 hour prior to any dental work, including cleanings.  Dispense:  12 tablet    Refill:  12    Order Specific Question:   Supervising Provider    Answer:   Sherren Mocha [[id number]]    Disposition:  In august with Dr. Bronson Ing  Signed, Angelena Form, PA-C  03/17/2019 9:46 AM    [address], [place], [place]  [address] Phone: [phone]; Fax: [phone]

## 2019-03-17 ENCOUNTER — Other Ambulatory Visit: Payer: Self-pay | Admitting: Physician Assistant

## 2019-03-17 DIAGNOSIS — Z952 Presence of prosthetic heart valve: Secondary | ICD-10-CM

## 2019-03-17 DIAGNOSIS — R0602 Shortness of breath: Secondary | ICD-10-CM | POA: Diagnosis not present

## 2019-03-27 DIAGNOSIS — I951 Orthostatic hypotension: Secondary | ICD-10-CM | POA: Diagnosis not present

## 2019-03-27 DIAGNOSIS — E1343 Other specified diabetes mellitus with diabetic autonomic (poly)neuropathy: Secondary | ICD-10-CM | POA: Diagnosis not present

## 2019-03-27 DIAGNOSIS — Q782 Osteopetrosis: Secondary | ICD-10-CM | POA: Diagnosis not present

## 2019-03-27 DIAGNOSIS — Z79899 Other long term (current) drug therapy: Secondary | ICD-10-CM | POA: Diagnosis not present

## 2019-03-27 DIAGNOSIS — M130 Polyarthritis, unspecified: Secondary | ICD-10-CM | POA: Diagnosis not present

## 2019-03-27 DIAGNOSIS — E559 Vitamin D deficiency, unspecified: Secondary | ICD-10-CM | POA: Diagnosis not present

## 2019-03-27 DIAGNOSIS — R296 Repeated falls: Secondary | ICD-10-CM | POA: Diagnosis not present

## 2019-03-28 ENCOUNTER — Other Ambulatory Visit: Payer: Self-pay | Admitting: Physical Medicine and Rehabilitation

## 2019-03-29 DIAGNOSIS — G894 Chronic pain syndrome: Secondary | ICD-10-CM | POA: Diagnosis not present

## 2019-03-29 DIAGNOSIS — E119 Type 2 diabetes mellitus without complications: Secondary | ICD-10-CM | POA: Diagnosis not present

## 2019-03-29 DIAGNOSIS — J42 Unspecified chronic bronchitis: Secondary | ICD-10-CM | POA: Diagnosis not present

## 2019-04-13 ENCOUNTER — Telehealth: Payer: Self-pay | Admitting: Physician Assistant

## 2019-04-21 DIAGNOSIS — Z7982 Long term (current) use of aspirin: Secondary | ICD-10-CM | POA: Diagnosis not present

## 2019-04-21 DIAGNOSIS — E7800 Pure hypercholesterolemia, unspecified: Secondary | ICD-10-CM | POA: Diagnosis not present

## 2019-04-21 DIAGNOSIS — I10 Essential (primary) hypertension: Secondary | ICD-10-CM | POA: Diagnosis not present

## 2019-04-21 DIAGNOSIS — R531 Weakness: Secondary | ICD-10-CM | POA: Diagnosis not present

## 2019-04-21 DIAGNOSIS — M1990 Unspecified osteoarthritis, unspecified site: Secondary | ICD-10-CM | POA: Diagnosis not present

## 2019-04-21 DIAGNOSIS — Z7984 Long term (current) use of oral hypoglycemic drugs: Secondary | ICD-10-CM | POA: Diagnosis not present

## 2019-04-21 DIAGNOSIS — M810 Age-related osteoporosis without current pathological fracture: Secondary | ICD-10-CM | POA: Diagnosis not present

## 2019-04-21 DIAGNOSIS — J449 Chronic obstructive pulmonary disease, unspecified: Secondary | ICD-10-CM | POA: Diagnosis not present

## 2019-04-21 DIAGNOSIS — H9313 Tinnitus, bilateral: Secondary | ICD-10-CM | POA: Diagnosis not present

## 2019-04-21 DIAGNOSIS — E039 Hypothyroidism, unspecified: Secondary | ICD-10-CM | POA: Diagnosis not present

## 2019-04-21 DIAGNOSIS — H9312 Tinnitus, left ear: Secondary | ICD-10-CM | POA: Diagnosis not present

## 2019-04-21 DIAGNOSIS — H748X1 Other specified disorders of right middle ear and mastoid: Secondary | ICD-10-CM | POA: Diagnosis not present

## 2019-04-21 DIAGNOSIS — R42 Dizziness and giddiness: Secondary | ICD-10-CM | POA: Diagnosis not present

## 2019-04-21 DIAGNOSIS — Z79899 Other long term (current) drug therapy: Secondary | ICD-10-CM | POA: Diagnosis not present

## 2019-04-21 DIAGNOSIS — M069 Rheumatoid arthritis, unspecified: Secondary | ICD-10-CM | POA: Diagnosis not present

## 2019-04-21 DIAGNOSIS — E1121 Type 2 diabetes mellitus with diabetic nephropathy: Secondary | ICD-10-CM | POA: Diagnosis not present

## 2019-04-26 DIAGNOSIS — E1140 Type 2 diabetes mellitus with diabetic neuropathy, unspecified: Secondary | ICD-10-CM | POA: Diagnosis not present

## 2019-04-26 DIAGNOSIS — M1991 Primary osteoarthritis, unspecified site: Secondary | ICD-10-CM | POA: Diagnosis not present

## 2019-04-26 DIAGNOSIS — M159 Polyosteoarthritis, unspecified: Secondary | ICD-10-CM | POA: Diagnosis not present

## 2019-04-26 DIAGNOSIS — H409 Unspecified glaucoma: Secondary | ICD-10-CM | POA: Diagnosis not present

## 2019-04-26 DIAGNOSIS — G894 Chronic pain syndrome: Secondary | ICD-10-CM | POA: Diagnosis not present

## 2019-05-15 ENCOUNTER — Inpatient Hospital Stay (HOSPITAL_COMMUNITY): Admission: RE | Admit: 2019-05-15 | Payer: Self-pay | Source: Ambulatory Visit

## 2019-05-22 DIAGNOSIS — R296 Repeated falls: Secondary | ICD-10-CM | POA: Diagnosis not present

## 2019-05-22 DIAGNOSIS — E1343 Other specified diabetes mellitus with diabetic autonomic (poly)neuropathy: Secondary | ICD-10-CM | POA: Diagnosis not present

## 2019-05-22 DIAGNOSIS — M130 Polyarthritis, unspecified: Secondary | ICD-10-CM | POA: Diagnosis not present

## 2019-05-22 DIAGNOSIS — I951 Orthostatic hypotension: Secondary | ICD-10-CM | POA: Diagnosis not present

## 2019-06-02 ENCOUNTER — Encounter: Payer: Self-pay | Admitting: Thoracic Surgery (Cardiothoracic Vascular Surgery)

## 2019-06-05 ENCOUNTER — Ambulatory Visit: Payer: Medicare Other | Admitting: Neurology

## 2019-06-21 ENCOUNTER — Other Ambulatory Visit: Payer: Self-pay

## 2019-06-21 DIAGNOSIS — I350 Nonrheumatic aortic (valve) stenosis: Secondary | ICD-10-CM

## 2019-06-21 DIAGNOSIS — Z952 Presence of prosthetic heart valve: Secondary | ICD-10-CM

## 2019-06-22 ENCOUNTER — Encounter: Payer: Self-pay | Admitting: Cardiovascular Disease

## 2019-06-29 ENCOUNTER — Other Ambulatory Visit: Payer: Self-pay

## 2019-06-29 NOTE — Patient Outreach (Signed)
[facility]) Care Management  06/29/2019  NIASHA DEVINS 12/18/1945 [id number]   Medication Adherence call to Mrs. [place] Compliant Voice message left with a call back number. Mrs. Deprey is showing past due on Atorvastatin 20 mg and Glipizide 10 mg under [place].   [place] Management Direct Dial [phone]  Fax [phone] [email or web address]

## 2019-06-30 DIAGNOSIS — G894 Chronic pain syndrome: Secondary | ICD-10-CM | POA: Diagnosis not present

## 2019-07-06 ENCOUNTER — Ambulatory Visit (HOSPITAL_COMMUNITY): Admission: RE | Admit: 2019-07-06 | Payer: Medicare Other | Source: Ambulatory Visit

## 2019-07-24 ENCOUNTER — Ambulatory Visit: Payer: Self-pay | Admitting: Cardiovascular Disease

## 2019-07-28 ENCOUNTER — Other Ambulatory Visit: Payer: Self-pay | Admitting: Physical Medicine and Rehabilitation

## 2019-08-03 DIAGNOSIS — G894 Chronic pain syndrome: Secondary | ICD-10-CM | POA: Diagnosis not present

## 2019-08-21 ENCOUNTER — Other Ambulatory Visit: Payer: Self-pay | Admitting: Medical

## 2019-08-29 DIAGNOSIS — S0990XA Unspecified injury of head, initial encounter: Secondary | ICD-10-CM | POA: Diagnosis not present

## 2019-08-29 DIAGNOSIS — Z7982 Long term (current) use of aspirin: Secondary | ICD-10-CM | POA: Diagnosis not present

## 2019-08-29 DIAGNOSIS — E039 Hypothyroidism, unspecified: Secondary | ICD-10-CM | POA: Diagnosis not present

## 2019-08-29 DIAGNOSIS — E1140 Type 2 diabetes mellitus with diabetic neuropathy, unspecified: Secondary | ICD-10-CM | POA: Diagnosis not present

## 2019-08-29 DIAGNOSIS — E1165 Type 2 diabetes mellitus with hyperglycemia: Secondary | ICD-10-CM | POA: Diagnosis not present

## 2019-08-29 DIAGNOSIS — Z7984 Long term (current) use of oral hypoglycemic drugs: Secondary | ICD-10-CM | POA: Diagnosis not present

## 2019-08-29 DIAGNOSIS — Z79899 Other long term (current) drug therapy: Secondary | ICD-10-CM | POA: Diagnosis not present

## 2019-08-29 DIAGNOSIS — R531 Weakness: Secondary | ICD-10-CM | POA: Diagnosis not present

## 2019-08-29 DIAGNOSIS — H538 Other visual disturbances: Secondary | ICD-10-CM | POA: Diagnosis not present

## 2019-08-29 DIAGNOSIS — W01198A Fall on same level from slipping, tripping and stumbling with subsequent striking against other object, initial encounter: Secondary | ICD-10-CM | POA: Diagnosis not present

## 2019-08-29 DIAGNOSIS — R079 Chest pain, unspecified: Secondary | ICD-10-CM | POA: Diagnosis not present

## 2019-08-29 DIAGNOSIS — W19XXXA Unspecified fall, initial encounter: Secondary | ICD-10-CM | POA: Diagnosis not present

## 2019-08-29 DIAGNOSIS — E7800 Pure hypercholesterolemia, unspecified: Secondary | ICD-10-CM | POA: Diagnosis not present

## 2019-08-29 DIAGNOSIS — M069 Rheumatoid arthritis, unspecified: Secondary | ICD-10-CM | POA: Diagnosis not present

## 2019-08-29 DIAGNOSIS — J449 Chronic obstructive pulmonary disease, unspecified: Secondary | ICD-10-CM | POA: Diagnosis not present

## 2019-08-29 DIAGNOSIS — I10 Essential (primary) hypertension: Secondary | ICD-10-CM | POA: Diagnosis not present

## 2019-08-29 DIAGNOSIS — S199XXA Unspecified injury of neck, initial encounter: Secondary | ICD-10-CM | POA: Diagnosis not present

## 2019-08-29 DIAGNOSIS — S299XXA Unspecified injury of thorax, initial encounter: Secondary | ICD-10-CM | POA: Diagnosis not present

## 2019-08-29 DIAGNOSIS — M810 Age-related osteoporosis without current pathological fracture: Secondary | ICD-10-CM | POA: Diagnosis not present

## 2019-08-30 ENCOUNTER — Encounter: Payer: Self-pay | Admitting: Cardiovascular Disease

## 2019-08-30 ENCOUNTER — Ambulatory Visit (INDEPENDENT_AMBULATORY_CARE_PROVIDER_SITE_OTHER): Payer: Medicare Other | Admitting: Cardiovascular Disease

## 2019-08-30 ENCOUNTER — Other Ambulatory Visit: Payer: Self-pay

## 2019-08-30 VITALS — BP 173/82 | HR 75 | Temp 97.9°F | Ht <= 58 in | Wt 149.0 lb

## 2019-08-30 DIAGNOSIS — I350 Nonrheumatic aortic (valve) stenosis: Secondary | ICD-10-CM | POA: Diagnosis not present

## 2019-08-30 DIAGNOSIS — Z952 Presence of prosthetic heart valve: Secondary | ICD-10-CM

## 2019-08-30 DIAGNOSIS — I6529 Occlusion and stenosis of unspecified carotid artery: Secondary | ICD-10-CM

## 2019-08-30 DIAGNOSIS — I10 Essential (primary) hypertension: Secondary | ICD-10-CM | POA: Diagnosis not present

## 2019-08-30 DIAGNOSIS — E119 Type 2 diabetes mellitus without complications: Secondary | ICD-10-CM

## 2019-08-30 DIAGNOSIS — Z8673 Personal history of transient ischemic attack (TIA), and cerebral infarction without residual deficits: Secondary | ICD-10-CM

## 2019-08-30 MED ORDER — METFORMIN HCL 500 MG PO TABS: 500.0000 mg | ORAL_TABLET | Freq: Two times a day (BID) | ORAL | 0 refills | Status: DC

## 2019-08-30 NOTE — Patient Instructions (Signed)
Medication Instructions:  Your physician recommends that you continue on your current medications as directed. Please refer to the Current Medication list given to you today. You have been given a 1 week supply of Metformin   If you need a refill on your cardiac medications before your next appointment, please call your pharmacy.   Lab work: NONE  If you have labs (blood work) drawn today and your tests are completely normal, you will receive your results only by: Marland Kitchen MyChart Message (if you have MyChart) OR . A paper copy in the mail If you have any lab test that is abnormal or we need to change your treatment, we will call you to review the results.  Testing/Procedures: NONE   Follow-Up: At [facility], you and your health needs are our priority.  As part of our continuing mission to provide you with exceptional heart care, we have created designated Provider Care Teams.  These Care Teams include your primary Cardiologist (physician) and Advanced Practice Providers (APPs -  Physician Assistants and Nurse Practitioners) who all work together to provide you with the care you need, when you need it. You will need a follow up appointment in 1 years.  Please call our office 2 months in advance to schedule this appointment.  You may see Kate Sable, MD or one of the following Advanced Practice Providers on your designated Care Team:   Bernerd Pho, PA-C [facility]) . Ermalinda Barrios, PA-C ([place])  Any Other Special Instructions Will Be Listed Below (If Applicable). The Number to Vascular & [facility] Specialists is [phone]  Thank you for choosing [facility]!

## 2019-08-30 NOTE — Progress Notes (Signed)
SUBJECTIVE: The patient presents for routine follow-up.  She has a history of hypertension, hyperlipidemia, hypothyroidism, type 2 diabetes mellitus, CVA, chronic bronchitis, chronic kidney disease stage III, rheumatoid arthritis, and severe aortic stenosis status post TAVR on 01/10/2019.  She underwent successful TAVR with a22mm Edwards Sapien 3 THV via the TF approach on 01/10/19. Post operative echoshowed EF 60% with normally functioning TAVR with mean gradient 11 mmHg and mild PVL.   She was evaluated at [facility] ED last night for a fall.  She was hyperglycemic with a blood sugar in the 400 range.  She follows with neurology.  They feel that her blood pressure drops when she stands up leading to dizziness and falls.  She denies loss of consciousness.  She denies exertional chest pain.  Chronic exertional dyspnea appears to be stable.  Blood pressure in the ED was 158/87.  Labs include hemoglobin 12.6, white blood cells 9.6, and platelets 308.  She was never seen by vascular surgery.  A referral was placed back in April.  Notes in our system say the patient refused.  She does not remember this.  She is here today with her adopted daughter.     Review of Systems: As per "subjective", otherwise negative.  No Known Allergies  Current Outpatient Medications  Medication Sig Dispense Refill  . acetaminophen (TYLENOL) 325 MG tablet Take 1-2 tablets (325-650 mg total) by mouth every 4 (four) hours as needed for mild pain.    Marland Kitchen ALPRAZolam (XANAX) 0.5 MG tablet Take 0.5 mg by mouth at bedtime as needed for sleep.     Marland Kitchen amLODipine (NORVASC) 10 MG tablet Take 1 tablet (10 mg total) by mouth at bedtime. 30 tablet 0  . amoxicillin (AMOXIL) 500 MG tablet Take 4 tablets (2,000 mg total) by mouth as directed. Take 4 tablets 1 hour prior to any dental work, including cleanings. 12 tablet 12  . aspirin EC 81 MG tablet Take 81 mg by mouth daily.     Marland Kitchen atorvastatin (LIPITOR) 20 MG tablet TAKE 1 TABLET  BY MOUTH ONCE DAILY AT  6PM 90 tablet 3  . beta carotene w/minerals (OCUVITE) tablet Take 1 tablet by mouth daily.    . carvedilol (COREG) 6.25 MG tablet Take 1 tablet (6.25 mg total) by mouth 2 (two) times daily with a meal. 60 tablet 0  . clopidogrel (PLAVIX) 75 MG tablet Take 1 tablet (75 mg total) by mouth daily with breakfast. 90 tablet 0  . Ferrous Sulfate (IRON) 325 (65 Fe) MG TABS TAKE 1 TABLET BY MOUTH EVERY OTHER DAY 30 tablet 0  . fluticasone (FLONASE) 50 MCG/ACT nasal spray Place into both nostrils daily as needed for allergies or rhinitis.    Marland Kitchen glipiZIDE (GLUCOTROL) 10 MG tablet Take 1 tablet (10 mg total) by mouth 2 (two) times daily before a meal. 60 tablet 0  . latanoprost (XALATAN) 0.005 % ophthalmic solution Place 1 drop into both eyes at bedtime.     Marland Kitchen levothyroxine (SYNTHROID, LEVOTHROID) 88 MCG tablet Take 1 tablet (88 mcg total) by mouth daily. 30 tablet 0  . potassium chloride SA (K-DUR,KLOR-CON) 20 MEQ tablet Take 1 tablet (20 mEq total) by mouth daily. 30 tablet 0  . Prenatal Vit-Fe Fumarate-FA (PRENATAL MULTIVITAMIN) TABS Take 1 tablet by mouth daily.    . timolol (TIMOPTIC) 0.5 % ophthalmic solution Place 1 drop into both eyes 2 (two) times daily.     Marland Kitchen trolamine salicylate (ASPERCREME) 10 % cream Apply 1  application topically as needed for muscle pain.    . VENTOLIN HFA 108 (90 Base) MCG/ACT inhaler Inhale 2 puffs into the lungs every 4 (four) hours as needed for wheezing or shortness of breath.   0  . vitamin B-12 (CYANOCOBALAMIN) 1000 MCG tablet Take 1,000 mcg by mouth daily.    . vitamin C (ASCORBIC ACID) 500 MG tablet Take 500 mg by mouth daily.     No current facility-administered medications for this visit.     Past Medical History:  Diagnosis Date  . Anemia   . Anxiety   . Arthritis   . Blood transfusion without reported diagnosis   . Bronchitis   . CHF (congestive heart failure) ([place])   . CKD (chronic kidney disease) stage 3, GFR 30-59 ml/min ([facility])  08/18/2018  . Depression   . Diabetes mellitus   . Diverticulitis   . Glaucoma   . Hyperlipidemia   . Hypertension   . Hypothyroidism   . Macular degeneration   . Melanoma ([place])    left thigh, 2010  . Osteoarthritis   . Peptic ulcer   . Rheumatoid arthritis(714.0)   . 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 Severe aortic stenosis     Past Surgical History:  Procedure Laterality Date  . ABDOMINAL HYSTERECTOMY  1988  . APPENDECTOMY    . BACK SURGERY  07/2008   fusion  . BACK SURGERY  12/930   complicated by post-op anemia requiring transfusion  . BREAST SURGERY     right breast biopsy  . CARDIAC CATHETERIZATION    . CATARACT EXTRACTION    . CHOLECYSTECTOMY  1974  . COLONOSCOPY  06/06/2007   Proximal diminutive rectal polyps cold biopsied/removed, otherwise normal rectum/ Left-sided diverticula, the remainder of the colonic mucosa appeared normal. Path-tubular adnemoa.   . COLONOSCOPY  09/14/2012   TFT:DDUKGU polyps-removed as described  above Colonic diverticulosis. Poor prep. next TCS five years.   . COLONOSCOPY N/A 10/29/2016   Procedure: COLONOSCOPY;  Surgeon: Daneil Dolin, MD;  [facility];  Service: Endoscopy;  Laterality: N/A;  2:00 pm  . ESOPHAGOGASTRODUODENOSCOPY  09/2012   Rourk: gastric ulcer, gastric duodenal erosions, small hh. benign bx  . ESOPHAGOGASTRODUODENOSCOPY N/A 10/29/2016   Procedure: ESOPHAGOGASTRODUODENOSCOPY (EGD);  Surgeon: Daneil Dolin, MD;  [facility];  Service: Endoscopy;  Laterality: N/A;  . EYE SURGERY    . FOOT SURGERY  1994   left   . LUMBAR SPINE SURGERY  08/04/2014   dr Joya Salm  . macular degen    . MALONEY DILATION N/A 10/29/2016   Procedure: Venia Minks DILATION;  Surgeon: Daneil Dolin, MD;  [facility];  Service: Endoscopy;  Laterality: N/A;  . MELANOMA EXCISION  2010  .  MULTIPLE EXTRACTIONS WITH ALVEOLOPLASTY N/A 08/17/2018   Procedure: Extraction of tooth #'s 7,8, and 9 with alveoloplasty and gross debridement of remaining teeth;  Surgeon: Lenn Cal, DDS;  Location: [place];  Service: Oral Surgery;  Laterality: N/A;  . RIGHT/LEFT HEART CATH AND CORONARY ANGIOGRAPHY N/A 08/11/2018   Procedure: RIGHT/LEFT HEART CATH AND CORONARY ANGIOGRAPHY;  Surgeon: Burnell Blanks, MD;  Location: [place] [facility];  Service: Cardiovascular;  Laterality: N/A;  . TEE WITHOUT CARDIOVERSION N/A 01/10/2019   Procedure: TRANSESOPHAGEAL ECHOCARDIOGRAM (TEE);  Surgeon: Burnell Blanks, MD;  Location: [place] [facility];  Service: Open Heart  Surgery;  Laterality: N/A;  . TRANSCATHETER AORTIC VALVE REPLACEMENT, TRANSFEMORAL N/A 01/10/2019   Procedure: TRANSCATHETER AORTIC VALVE REPLACEMENT, TRANSFEMORAL;  Surgeon: Burnell Blanks, MD;  Location: [place] [facility];  Service: Open Heart Surgery;  Laterality: N/A;  . TRANSCATHETER AORTIC VALVE REPLACEMENT, TRANSFEMORAL  12/2018  . YAG LASER APPLICATION     OS    Social History   Socioeconomic History  . Marital status: Widowed    Spouse name: Not on file  . Number of children: 2  . Years of education: Not on file  . Highest education level: Not on file  Occupational History  . Not on file  Social Needs  . Financial resource strain: Somewhat hard  . Food insecurity    Worry: Sometimes true    Inability: Sometimes true  . Transportation needs    Medical: No    Non-medical: Yes  Tobacco Use  . Smoking status: Never Smoker  . Smokeless tobacco: Never Used  Substance and Sexual Activity  . Alcohol use: No    Comment: "Wine a couple times a month"  . Drug use: No  . Sexual activity: Yes    Birth control/protection: Surgical  Lifestyle  . Physical activity    Days per week: 7 days    Minutes per session: 60 min  . Stress: Only a little  Relationships  . Social connections    Talks on phone:  More than three times a week    Gets together: More than three times a week    Attends religious service: Not on file    Active member of club or organization: Not on file    Attends meetings of clubs or organizations: Not on file    Relationship status: Not on file  . Intimate partner violence    Fear of current or ex partner: No    Emotionally abused: No    Physically abused: No    Forced sexual activity: No  Other Topics Concern  . Not on file  Social History Narrative   Husband deceased 04/23/2011.      Vitals:   08/30/19 1355  BP: (!) 173/82  Pulse: 75  Temp: 97.9 F (36.6 C)  SpO2: 95%  Weight: 149 lb (67.6 kg)  Height: 4\' 9"  (1.448 m)    Wt Readings from Last 3 Encounters:  08/30/19 149 lb (67.6 kg)  01/25/19 142 lb 3.2 oz (64.5 kg)  01/16/19 142 lb 3.2 oz (64.5 kg)     PHYSICAL EXAM General: NAD HEENT: Normal. Neck: No JVD, no thyromegaly. Lungs: Diffusely diminished breath sounds, no crackles or wheezes. CV: Regular rate and rhythm, normal S1/S2, no T2/W5, soft systolic murmur over right upper sternal border.  Trace bilateral leg edema. Abdomen: Soft, nontender, no distention.  Neurologic: Alert and oriented.  Psych: Normal affect. Skin: Normal. Musculoskeletal: No gross deformities.      Labs: Lab Results  Component Value Date/Time   K 3.7 01/25/2019 06:08 AM   BUN 29 (H) 01/25/2019 06:08 AM   BUN 21 08/04/2018 04:58 PM   CREATININE 1.55 (H) 01/25/2019 06:08 AM   ALT 11 01/17/2019 06:38 AM   TSH 31.420 (H) 12/21/2018 04:58 AM   HGB 8.7 (L) 01/24/2019 05:20 AM   HGB 13.0 08/04/2018 04:58 PM     Lipids: Lab Results  Component Value Date/Time   LDLCALC 89 01/14/2019 04:15 AM   CHOL 163 01/14/2019 04:15 AM   TRIG 133 01/14/2019 04:15 AM   HDL 47 01/14/2019 04:15 AM  ASSESSMENT AND PLAN: 1.  Severe stenosis status post TAVR: Symptomatically stable with NYHA class II symptoms.  She is currently taking both aspirin and Plavix presumably  due to embolic CVA.  Plavix could have been stopped in July from a TAVR standpoint.  2.  Hypertension: Blood pressure is markedly elevated.  However, the patient and daughter report that she becomes orthostatic.  This is being managed by neurology.   3.  Embolic CVA: Currently on aspirin and Plavix along with atorvastatin.  4.  Bilateral internal carotid artery stenosis: During stroke work-up she was found to have moderate to severe right-sided and moderate left-sided internal carotid artery stenosis.  She will need follow-up with vascular surgery.  A referral was placed back in April.  I will again provide the patient with their phone number.  Currently on aspirin, Plavix, and atorvastatin.  5.  Type 2 diabetes mellitus: She has run out of metformin but will see her PCP in 3 to 4 days.  I will supply her with 500 mg twice daily (1 week supply, no refills).   Disposition: Follow up in January 2021 with structural heart team.  Follow-up with general cardiology in 1 year.  Time spent: 40 minutes, of which greater than 50% was spent reviewing symptoms, relevant blood tests and studies, and discussing management plan with the patient.   Kate Sable, M.D., F.A.C.C.

## 2019-09-04 ENCOUNTER — Telehealth: Payer: Self-pay | Admitting: Cardiovascular Disease

## 2019-09-04 DIAGNOSIS — G4700 Insomnia, unspecified: Secondary | ICD-10-CM | POA: Diagnosis not present

## 2019-09-04 DIAGNOSIS — I6523 Occlusion and stenosis of bilateral carotid arteries: Secondary | ICD-10-CM

## 2019-09-04 DIAGNOSIS — G894 Chronic pain syndrome: Secondary | ICD-10-CM | POA: Diagnosis not present

## 2019-09-04 NOTE — Telephone Encounter (Signed)
New referral placed to [facility], patient made aware

## 2019-09-04 NOTE — Telephone Encounter (Signed)
Patient's daughter states that patient was to have referral to [facility]. No referral placed. / tg

## 2019-09-12 ENCOUNTER — Other Ambulatory Visit: Payer: Self-pay | Admitting: *Deleted

## 2019-09-12 NOTE — Patient Outreach (Signed)
Referral received [employer], pt with diagnoses chronic renal failure/ CKD stage 3, CHF, DM type 2, COPD, history acute blood loss anemia, TAVR.  Outreach call to pt for screening, no answer to telephone, left voicemail requesting return phone call, mailed unsuccessful outreach letter to pt home.  PLAN Outreach pt in 3-4 business days  Jacqlyn Larsen [facility], [place] [phone]

## 2019-09-15 ENCOUNTER — Other Ambulatory Visit: Payer: Self-pay | Admitting: *Deleted

## 2019-09-15 NOTE — Patient Outreach (Signed)
2nd attempt outreach for screening, no answer to telephone and no option to leave voicemail.  PLAN Outreach pt in 3-4 business days for screening  Jacqlyn Larsen [facility], [place] Coordinator [phone]

## 2019-09-20 ENCOUNTER — Other Ambulatory Visit: Payer: Self-pay | Admitting: *Deleted

## 2019-09-20 NOTE — Patient Outreach (Signed)
Outreach call to pt for screening/  3rd attempt, no answer to telephone and no option to leave voicemail.  Pt immediately called back, RN CM identified herself and pt states " I don't think you can, don't call me back"  RN CM faxed letter to primary MD informing case closed.  Case closed  Jacqlyn Larsen [facility], [place] Coordinator [phone]

## 2019-09-21 ENCOUNTER — Other Ambulatory Visit: Payer: Self-pay | Admitting: Cardiovascular Disease

## 2019-10-02 DIAGNOSIS — M1991 Primary osteoarthritis, unspecified site: Secondary | ICD-10-CM | POA: Diagnosis not present

## 2019-10-02 DIAGNOSIS — G894 Chronic pain syndrome: Secondary | ICD-10-CM | POA: Diagnosis not present

## 2019-10-20 DIAGNOSIS — E1129 Type 2 diabetes mellitus with other diabetic kidney complication: Secondary | ICD-10-CM | POA: Diagnosis not present

## 2019-10-20 DIAGNOSIS — Z1389 Encounter for screening for other disorder: Secondary | ICD-10-CM | POA: Diagnosis not present

## 2019-10-20 DIAGNOSIS — I10 Essential (primary) hypertension: Secondary | ICD-10-CM | POA: Diagnosis not present

## 2019-10-20 DIAGNOSIS — Z23 Encounter for immunization: Secondary | ICD-10-CM | POA: Diagnosis not present

## 2019-10-20 DIAGNOSIS — Z0001 Encounter for general adult medical examination with abnormal findings: Secondary | ICD-10-CM | POA: Diagnosis not present

## 2019-10-20 DIAGNOSIS — J302 Other seasonal allergic rhinitis: Secondary | ICD-10-CM | POA: Diagnosis not present

## 2019-10-23 DIAGNOSIS — Z1389 Encounter for screening for other disorder: Secondary | ICD-10-CM | POA: Diagnosis not present

## 2019-10-23 DIAGNOSIS — E119 Type 2 diabetes mellitus without complications: Secondary | ICD-10-CM | POA: Diagnosis not present

## 2019-10-23 DIAGNOSIS — E1129 Type 2 diabetes mellitus with other diabetic kidney complication: Secondary | ICD-10-CM | POA: Diagnosis not present

## 2019-11-08 ENCOUNTER — Other Ambulatory Visit: Payer: Self-pay

## 2019-11-08 DIAGNOSIS — I6529 Occlusion and stenosis of unspecified carotid artery: Secondary | ICD-10-CM

## 2019-11-13 ENCOUNTER — Encounter: Payer: PRIVATE HEALTH INSURANCE | Admitting: Surgery

## 2019-11-13 ENCOUNTER — Encounter (HOSPITAL_COMMUNITY): Payer: PRIVATE HEALTH INSURANCE

## 2019-11-13 DIAGNOSIS — J449 Chronic obstructive pulmonary disease, unspecified: Secondary | ICD-10-CM | POA: Diagnosis not present

## 2019-11-13 DIAGNOSIS — I5032 Chronic diastolic (congestive) heart failure: Secondary | ICD-10-CM | POA: Diagnosis not present

## 2019-11-13 DIAGNOSIS — I110 Hypertensive heart disease with heart failure: Secondary | ICD-10-CM | POA: Diagnosis not present

## 2019-11-13 DIAGNOSIS — E1140 Type 2 diabetes mellitus with diabetic neuropathy, unspecified: Secondary | ICD-10-CM | POA: Diagnosis not present

## 2019-12-01 DIAGNOSIS — G894 Chronic pain syndrome: Secondary | ICD-10-CM | POA: Diagnosis not present

## 2019-12-01 DIAGNOSIS — J302 Other seasonal allergic rhinitis: Secondary | ICD-10-CM | POA: Diagnosis not present

## 2019-12-05 DIAGNOSIS — R410 Disorientation, unspecified: Secondary | ICD-10-CM | POA: Diagnosis not present

## 2019-12-05 DIAGNOSIS — I5032 Chronic diastolic (congestive) heart failure: Secondary | ICD-10-CM | POA: Diagnosis not present

## 2019-12-05 DIAGNOSIS — R39198 Other difficulties with micturition: Secondary | ICD-10-CM | POA: Diagnosis not present

## 2019-12-05 DIAGNOSIS — R197 Diarrhea, unspecified: Secondary | ICD-10-CM | POA: Diagnosis not present

## 2019-12-05 DIAGNOSIS — E1151 Type 2 diabetes mellitus with diabetic peripheral angiopathy without gangrene: Secondary | ICD-10-CM | POA: Diagnosis not present

## 2019-12-14 DIAGNOSIS — J449 Chronic obstructive pulmonary disease, unspecified: Secondary | ICD-10-CM | POA: Diagnosis not present

## 2019-12-14 DIAGNOSIS — I110 Hypertensive heart disease with heart failure: Secondary | ICD-10-CM | POA: Diagnosis not present

## 2019-12-14 DIAGNOSIS — E1140 Type 2 diabetes mellitus with diabetic neuropathy, unspecified: Secondary | ICD-10-CM | POA: Diagnosis not present

## 2019-12-14 DIAGNOSIS — I5032 Chronic diastolic (congestive) heart failure: Secondary | ICD-10-CM | POA: Diagnosis not present

## 2019-12-28 ENCOUNTER — Other Ambulatory Visit: Payer: Self-pay | Admitting: Physician Assistant

## 2019-12-28 DIAGNOSIS — Z952 Presence of prosthetic heart valve: Secondary | ICD-10-CM

## 2020-01-01 DIAGNOSIS — F419 Anxiety disorder, unspecified: Secondary | ICD-10-CM | POA: Diagnosis not present

## 2020-01-01 DIAGNOSIS — G894 Chronic pain syndrome: Secondary | ICD-10-CM | POA: Diagnosis not present

## 2020-01-04 ENCOUNTER — Ambulatory Visit: Payer: Self-pay | Admitting: Physician Assistant

## 2020-01-04 ENCOUNTER — Other Ambulatory Visit (HOSPITAL_COMMUNITY): Payer: Self-pay

## 2020-01-14 DIAGNOSIS — G894 Chronic pain syndrome: Secondary | ICD-10-CM | POA: Diagnosis not present

## 2020-01-14 DIAGNOSIS — I5032 Chronic diastolic (congestive) heart failure: Secondary | ICD-10-CM | POA: Diagnosis not present

## 2020-01-14 DIAGNOSIS — I110 Hypertensive heart disease with heart failure: Secondary | ICD-10-CM | POA: Diagnosis not present

## 2020-01-14 DIAGNOSIS — F4542 Pain disorder with related psychological factors: Secondary | ICD-10-CM | POA: Diagnosis not present

## 2020-01-17 ENCOUNTER — Other Ambulatory Visit (HOSPITAL_COMMUNITY): Payer: Self-pay | Admitting: Nephrology

## 2020-01-17 DIAGNOSIS — E1122 Type 2 diabetes mellitus with diabetic chronic kidney disease: Secondary | ICD-10-CM | POA: Diagnosis not present

## 2020-01-17 DIAGNOSIS — N189 Chronic kidney disease, unspecified: Secondary | ICD-10-CM | POA: Diagnosis not present

## 2020-01-17 DIAGNOSIS — E876 Hypokalemia: Secondary | ICD-10-CM | POA: Diagnosis not present

## 2020-01-17 DIAGNOSIS — R809 Proteinuria, unspecified: Secondary | ICD-10-CM | POA: Diagnosis not present

## 2020-01-17 DIAGNOSIS — E1129 Type 2 diabetes mellitus with other diabetic kidney complication: Secondary | ICD-10-CM | POA: Diagnosis not present

## 2020-01-17 DIAGNOSIS — I129 Hypertensive chronic kidney disease with stage 1 through stage 4 chronic kidney disease, or unspecified chronic kidney disease: Secondary | ICD-10-CM | POA: Diagnosis not present

## 2020-01-17 DIAGNOSIS — D638 Anemia in other chronic diseases classified elsewhere: Secondary | ICD-10-CM | POA: Diagnosis not present

## 2020-01-17 DIAGNOSIS — I5032 Chronic diastolic (congestive) heart failure: Secondary | ICD-10-CM | POA: Diagnosis not present

## 2020-01-17 DIAGNOSIS — N1832 Chronic kidney disease, stage 3b: Secondary | ICD-10-CM

## 2020-01-18 ENCOUNTER — Other Ambulatory Visit (HOSPITAL_COMMUNITY): Payer: Self-pay | Admitting: Nephrology

## 2020-01-18 DIAGNOSIS — E876 Hypokalemia: Secondary | ICD-10-CM

## 2020-01-18 DIAGNOSIS — I129 Hypertensive chronic kidney disease with stage 1 through stage 4 chronic kidney disease, or unspecified chronic kidney disease: Secondary | ICD-10-CM

## 2020-01-18 DIAGNOSIS — E1122 Type 2 diabetes mellitus with diabetic chronic kidney disease: Secondary | ICD-10-CM

## 2020-01-18 DIAGNOSIS — I5032 Chronic diastolic (congestive) heart failure: Secondary | ICD-10-CM

## 2020-01-18 DIAGNOSIS — D638 Anemia in other chronic diseases classified elsewhere: Secondary | ICD-10-CM

## 2020-01-18 DIAGNOSIS — R809 Proteinuria, unspecified: Secondary | ICD-10-CM

## 2020-01-18 DIAGNOSIS — E1129 Type 2 diabetes mellitus with other diabetic kidney complication: Secondary | ICD-10-CM

## 2020-01-30 ENCOUNTER — Encounter (HOSPITAL_COMMUNITY): Payer: Self-pay

## 2020-01-30 ENCOUNTER — Ambulatory Visit (HOSPITAL_COMMUNITY): Admission: RE | Admit: 2020-01-30 | Payer: Medicare HMO | Source: Ambulatory Visit

## 2020-02-05 ENCOUNTER — Telehealth (INDEPENDENT_AMBULATORY_CARE_PROVIDER_SITE_OTHER): Payer: Medicare HMO | Admitting: Physician Assistant

## 2020-02-05 ENCOUNTER — Telehealth: Payer: Self-pay

## 2020-02-05 ENCOUNTER — Other Ambulatory Visit: Payer: Self-pay

## 2020-02-05 NOTE — Progress Notes (Addendum)
Patient due for her 1 year follow up s/p TAVR. Patient no showed. We have tried several times to get in touch with her and she does not answer her phone. She has also not shown up to several appointment for echocardiograms. She has attended other medical appointments per review of her chart in Epic and care everywhere. We will no try to contact her again.   Angelena Form PA-C  [facility]

## 2020-02-05 NOTE — Telephone Encounter (Signed)
Attempted several times to call patient and room her for virtual visit today. She never answered and her VM is full.  Patient marked as "no show."

## 2020-02-09 ENCOUNTER — Other Ambulatory Visit: Payer: Self-pay

## 2020-02-09 ENCOUNTER — Ambulatory Visit (HOSPITAL_COMMUNITY): Admission: RE | Admit: 2020-02-09 | Payer: Medicare HMO | Source: Ambulatory Visit

## 2020-02-09 ENCOUNTER — Inpatient Hospital Stay (HOSPITAL_COMMUNITY)
Admission: EM | Admit: 2020-02-09 | Discharge: 2020-03-14 | DRG: 207 | Disposition: E | Payer: Medicare HMO | Attending: Pulmonary Disease | Admitting: Pulmonary Disease

## 2020-02-09 ENCOUNTER — Emergency Department (HOSPITAL_COMMUNITY): Payer: Medicare HMO

## 2020-02-09 ENCOUNTER — Encounter (HOSPITAL_COMMUNITY): Payer: Self-pay | Admitting: Emergency Medicine

## 2020-02-09 DIAGNOSIS — J209 Acute bronchitis, unspecified: Secondary | ICD-10-CM | POA: Diagnosis not present

## 2020-02-09 DIAGNOSIS — J189 Pneumonia, unspecified organism: Secondary | ICD-10-CM | POA: Diagnosis not present

## 2020-02-09 DIAGNOSIS — K5730 Diverticulosis of large intestine without perforation or abscess without bleeding: Secondary | ICD-10-CM | POA: Diagnosis not present

## 2020-02-09 DIAGNOSIS — R579 Shock, unspecified: Secondary | ICD-10-CM | POA: Diagnosis not present

## 2020-02-09 DIAGNOSIS — Z79899 Other long term (current) drug therapy: Secondary | ICD-10-CM

## 2020-02-09 DIAGNOSIS — R062 Wheezing: Secondary | ICD-10-CM

## 2020-02-09 DIAGNOSIS — E1122 Type 2 diabetes mellitus with diabetic chronic kidney disease: Secondary | ICD-10-CM | POA: Diagnosis present

## 2020-02-09 DIAGNOSIS — Z833 Family history of diabetes mellitus: Secondary | ICD-10-CM

## 2020-02-09 DIAGNOSIS — J9621 Acute and chronic respiratory failure with hypoxia: Secondary | ICD-10-CM | POA: Diagnosis present

## 2020-02-09 DIAGNOSIS — G894 Chronic pain syndrome: Secondary | ICD-10-CM | POA: Diagnosis not present

## 2020-02-09 DIAGNOSIS — J42 Unspecified chronic bronchitis: Secondary | ICD-10-CM | POA: Diagnosis not present

## 2020-02-09 DIAGNOSIS — R0602 Shortness of breath: Secondary | ICD-10-CM | POA: Diagnosis not present

## 2020-02-09 DIAGNOSIS — Z66 Do not resuscitate: Secondary | ICD-10-CM | POA: Diagnosis not present

## 2020-02-09 DIAGNOSIS — D689 Coagulation defect, unspecified: Secondary | ICD-10-CM | POA: Diagnosis not present

## 2020-02-09 DIAGNOSIS — J129 Viral pneumonia, unspecified: Principal | ICD-10-CM | POA: Diagnosis present

## 2020-02-09 DIAGNOSIS — I350 Nonrheumatic aortic (valve) stenosis: Secondary | ICD-10-CM | POA: Diagnosis not present

## 2020-02-09 DIAGNOSIS — K661 Hemoperitoneum: Secondary | ICD-10-CM | POA: Diagnosis not present

## 2020-02-09 DIAGNOSIS — Z8719 Personal history of other diseases of the digestive system: Secondary | ICD-10-CM

## 2020-02-09 DIAGNOSIS — J441 Chronic obstructive pulmonary disease with (acute) exacerbation: Secondary | ICD-10-CM | POA: Diagnosis present

## 2020-02-09 DIAGNOSIS — R58 Hemorrhage, not elsewhere classified: Secondary | ICD-10-CM | POA: Diagnosis not present

## 2020-02-09 DIAGNOSIS — J90 Pleural effusion, not elsewhere classified: Secondary | ICD-10-CM | POA: Diagnosis not present

## 2020-02-09 DIAGNOSIS — E11649 Type 2 diabetes mellitus with hypoglycemia without coma: Secondary | ICD-10-CM | POA: Diagnosis not present

## 2020-02-09 DIAGNOSIS — E1140 Type 2 diabetes mellitus with diabetic neuropathy, unspecified: Secondary | ICD-10-CM | POA: Diagnosis present

## 2020-02-09 DIAGNOSIS — D62 Acute posthemorrhagic anemia: Secondary | ICD-10-CM | POA: Diagnosis not present

## 2020-02-09 DIAGNOSIS — T380X5A Adverse effect of glucocorticoids and synthetic analogues, initial encounter: Secondary | ICD-10-CM | POA: Diagnosis present

## 2020-02-09 DIAGNOSIS — J440 Chronic obstructive pulmonary disease with acute lower respiratory infection: Secondary | ICD-10-CM | POA: Diagnosis present

## 2020-02-09 DIAGNOSIS — E875 Hyperkalemia: Secondary | ICD-10-CM | POA: Diagnosis present

## 2020-02-09 DIAGNOSIS — J9811 Atelectasis: Secondary | ICD-10-CM | POA: Diagnosis not present

## 2020-02-09 DIAGNOSIS — J9601 Acute respiratory failure with hypoxia: Secondary | ICD-10-CM | POA: Diagnosis present

## 2020-02-09 DIAGNOSIS — N183 Chronic kidney disease, stage 3 unspecified: Secondary | ICD-10-CM | POA: Diagnosis present

## 2020-02-09 DIAGNOSIS — Z01818 Encounter for other preprocedural examination: Secondary | ICD-10-CM

## 2020-02-09 DIAGNOSIS — E872 Acidosis: Secondary | ICD-10-CM | POA: Diagnosis not present

## 2020-02-09 DIAGNOSIS — K7200 Acute and subacute hepatic failure without coma: Secondary | ICD-10-CM | POA: Diagnosis not present

## 2020-02-09 DIAGNOSIS — R0989 Other specified symptoms and signs involving the circulatory and respiratory systems: Secondary | ICD-10-CM

## 2020-02-09 DIAGNOSIS — I130 Hypertensive heart and chronic kidney disease with heart failure and stage 1 through stage 4 chronic kidney disease, or unspecified chronic kidney disease: Secondary | ICD-10-CM | POA: Diagnosis not present

## 2020-02-09 DIAGNOSIS — I5032 Chronic diastolic (congestive) heart failure: Secondary | ICD-10-CM | POA: Diagnosis present

## 2020-02-09 DIAGNOSIS — E1165 Type 2 diabetes mellitus with hyperglycemia: Secondary | ICD-10-CM | POA: Diagnosis present

## 2020-02-09 DIAGNOSIS — Z953 Presence of xenogenic heart valve: Secondary | ICD-10-CM

## 2020-02-09 DIAGNOSIS — H3530 Unspecified macular degeneration: Secondary | ICD-10-CM | POA: Diagnosis present

## 2020-02-09 DIAGNOSIS — N170 Acute kidney failure with tubular necrosis: Secondary | ICD-10-CM | POA: Diagnosis present

## 2020-02-09 DIAGNOSIS — R578 Other shock: Secondary | ICD-10-CM | POA: Diagnosis not present

## 2020-02-09 DIAGNOSIS — A419 Sepsis, unspecified organism: Secondary | ICD-10-CM | POA: Diagnosis not present

## 2020-02-09 DIAGNOSIS — Z90710 Acquired absence of both cervix and uterus: Secondary | ICD-10-CM

## 2020-02-09 DIAGNOSIS — G9341 Metabolic encephalopathy: Secondary | ICD-10-CM | POA: Diagnosis not present

## 2020-02-09 DIAGNOSIS — Z978 Presence of other specified devices: Secondary | ICD-10-CM

## 2020-02-09 DIAGNOSIS — Z79890 Hormone replacement therapy (postmenopausal): Secondary | ICD-10-CM

## 2020-02-09 DIAGNOSIS — J069 Acute upper respiratory infection, unspecified: Secondary | ICD-10-CM

## 2020-02-09 DIAGNOSIS — E785 Hyperlipidemia, unspecified: Secondary | ICD-10-CM | POA: Diagnosis present

## 2020-02-09 DIAGNOSIS — Z7982 Long term (current) use of aspirin: Secondary | ICD-10-CM

## 2020-02-09 DIAGNOSIS — Z85820 Personal history of malignant melanoma of skin: Secondary | ICD-10-CM | POA: Diagnosis not present

## 2020-02-09 DIAGNOSIS — Z20822 Contact with and (suspected) exposure to covid-19: Secondary | ICD-10-CM | POA: Diagnosis present

## 2020-02-09 DIAGNOSIS — G253 Myoclonus: Secondary | ICD-10-CM | POA: Diagnosis not present

## 2020-02-09 DIAGNOSIS — Z452 Encounter for adjustment and management of vascular access device: Secondary | ICD-10-CM

## 2020-02-09 DIAGNOSIS — J449 Chronic obstructive pulmonary disease, unspecified: Secondary | ICD-10-CM | POA: Diagnosis not present

## 2020-02-09 DIAGNOSIS — Z8673 Personal history of transient ischemic attack (TIA), and cerebral infarction without residual deficits: Secondary | ICD-10-CM

## 2020-02-09 DIAGNOSIS — R0902 Hypoxemia: Secondary | ICD-10-CM | POA: Diagnosis not present

## 2020-02-09 DIAGNOSIS — R0789 Other chest pain: Secondary | ICD-10-CM | POA: Diagnosis not present

## 2020-02-09 DIAGNOSIS — E1129 Type 2 diabetes mellitus with other diabetic kidney complication: Secondary | ICD-10-CM | POA: Diagnosis not present

## 2020-02-09 DIAGNOSIS — R34 Anuria and oliguria: Secondary | ICD-10-CM | POA: Diagnosis not present

## 2020-02-09 DIAGNOSIS — Z9049 Acquired absence of other specified parts of digestive tract: Secondary | ICD-10-CM

## 2020-02-09 DIAGNOSIS — N179 Acute kidney failure, unspecified: Secondary | ICD-10-CM

## 2020-02-09 DIAGNOSIS — Z952 Presence of prosthetic heart valve: Secondary | ICD-10-CM

## 2020-02-09 DIAGNOSIS — Z992 Dependence on renal dialysis: Secondary | ICD-10-CM | POA: Diagnosis not present

## 2020-02-09 DIAGNOSIS — N1832 Chronic kidney disease, stage 3b: Secondary | ICD-10-CM | POA: Diagnosis not present

## 2020-02-09 DIAGNOSIS — E039 Hypothyroidism, unspecified: Secondary | ICD-10-CM | POA: Diagnosis present

## 2020-02-09 DIAGNOSIS — E782 Mixed hyperlipidemia: Secondary | ICD-10-CM | POA: Diagnosis not present

## 2020-02-09 DIAGNOSIS — Z515 Encounter for palliative care: Secondary | ICD-10-CM | POA: Diagnosis not present

## 2020-02-09 DIAGNOSIS — R000 Tachycardia, unspecified: Secondary | ICD-10-CM | POA: Diagnosis not present

## 2020-02-09 DIAGNOSIS — I10 Essential (primary) hypertension: Secondary | ICD-10-CM | POA: Diagnosis not present

## 2020-02-09 DIAGNOSIS — R296 Repeated falls: Secondary | ICD-10-CM | POA: Diagnosis present

## 2020-02-09 DIAGNOSIS — T426X5A Adverse effect of other antiepileptic and sedative-hypnotic drugs, initial encounter: Secondary | ICD-10-CM | POA: Diagnosis not present

## 2020-02-09 DIAGNOSIS — Z8711 Personal history of peptic ulcer disease: Secondary | ICD-10-CM

## 2020-02-09 DIAGNOSIS — M069 Rheumatoid arthritis, unspecified: Secondary | ICD-10-CM | POA: Diagnosis present

## 2020-02-09 DIAGNOSIS — R918 Other nonspecific abnormal finding of lung field: Secondary | ICD-10-CM | POA: Diagnosis not present

## 2020-02-09 DIAGNOSIS — Z4682 Encounter for fitting and adjustment of non-vascular catheter: Secondary | ICD-10-CM | POA: Diagnosis not present

## 2020-02-09 DIAGNOSIS — F4542 Pain disorder with related psychological factors: Secondary | ICD-10-CM | POA: Diagnosis not present

## 2020-02-09 DIAGNOSIS — T383X5A Adverse effect of insulin and oral hypoglycemic [antidiabetic] drugs, initial encounter: Secondary | ICD-10-CM | POA: Diagnosis not present

## 2020-02-09 DIAGNOSIS — J9690 Respiratory failure, unspecified, unspecified whether with hypoxia or hypercapnia: Secondary | ICD-10-CM | POA: Diagnosis not present

## 2020-02-09 DIAGNOSIS — I509 Heart failure, unspecified: Secondary | ICD-10-CM | POA: Diagnosis not present

## 2020-02-09 DIAGNOSIS — I110 Hypertensive heart disease with heart failure: Secondary | ICD-10-CM | POA: Diagnosis not present

## 2020-02-09 DIAGNOSIS — S0990XA Unspecified injury of head, initial encounter: Secondary | ICD-10-CM | POA: Diagnosis not present

## 2020-02-09 DIAGNOSIS — Z7984 Long term (current) use of oral hypoglycemic drugs: Secondary | ICD-10-CM

## 2020-02-09 LAB — CBC WITH DIFFERENTIAL/PLATELET
Abs Immature Granulocytes: 0.05 10*3/uL (ref 0.00–0.07)
Basophils Absolute: 0.1 10*3/uL (ref 0.0–0.1)
Basophils Relative: 1 %
Eosinophils Absolute: 0.3 10*3/uL (ref 0.0–0.5)
Eosinophils Relative: 3 %
HCT: 36 % (ref 36.0–46.0)
Hemoglobin: 11.7 g/dL — ABNORMAL LOW (ref 12.0–15.0)
Immature Granulocytes: 0 %
Lymphocytes Relative: 18 %
Lymphs Abs: 2.1 10*3/uL (ref 0.7–4.0)
MCH: 28.7 pg (ref 26.0–34.0)
MCHC: 32.5 g/dL (ref 30.0–36.0)
MCV: 88.2 fL (ref 80.0–100.0)
Monocytes Absolute: 0.8 10*3/uL (ref 0.1–1.0)
Monocytes Relative: 7 %
Neutro Abs: 8.4 10*3/uL — ABNORMAL HIGH (ref 1.7–7.7)
Neutrophils Relative %: 71 %
Platelets: 366 10*3/uL (ref 150–400)
RBC: 4.08 MIL/uL (ref 3.87–5.11)
RDW: 14.3 % (ref 11.5–15.5)
WBC: 11.7 10*3/uL — ABNORMAL HIGH (ref 4.0–10.5)
nRBC: 0 % (ref 0.0–0.2)

## 2020-02-09 LAB — BASIC METABOLIC PANEL
Anion gap: 12 (ref 5–15)
BUN: 23 mg/dL (ref 8–23)
CO2: 27 mmol/L (ref 22–32)
Calcium: 8.9 mg/dL (ref 8.9–10.3)
Chloride: 99 mmol/L (ref 98–111)
Creatinine, Ser: 2.01 mg/dL — ABNORMAL HIGH (ref 0.44–1.00)
GFR calc Af Amer: 28 mL/min — ABNORMAL LOW (ref >60)
GFR calc non Af Amer: 24 mL/min — ABNORMAL LOW (ref >60)
Glucose, Bld: 134 mg/dL — ABNORMAL HIGH (ref 70–99)
Potassium: 3.2 mmol/L — ABNORMAL LOW (ref 3.5–5.1)
Sodium: 138 mmol/L (ref 135–145)

## 2020-02-09 LAB — URINALYSIS, ROUTINE W REFLEX MICROSCOPIC
Bacteria, UA: NONE SEEN
Bilirubin Urine: NEGATIVE
Glucose, UA: >=500 mg/dL — AB
Hgb urine dipstick: NEGATIVE
Ketones, ur: NEGATIVE mg/dL
Leukocytes,Ua: NEGATIVE
Nitrite: NEGATIVE
Protein, ur: >=300 mg/dL — AB
Specific Gravity, Urine: 1.009 (ref 1.005–1.030)
pH: 6 (ref 5.0–8.0)

## 2020-02-09 LAB — CBG MONITORING, ED: Glucose-Capillary: 124 mg/dL — ABNORMAL HIGH (ref 70–99)

## 2020-02-09 LAB — TROPONIN I (HIGH SENSITIVITY): Troponin I (High Sensitivity): 14 ng/L (ref <18)

## 2020-02-09 LAB — POC SARS CORONAVIRUS 2 AG -  ED: SARS Coronavirus 2 Ag: NEGATIVE

## 2020-02-09 MED ORDER — SODIUM CHLORIDE 0.9 % IV SOLN
250.0000 mL | INTRAVENOUS | Status: DC | PRN
  Administered 2020-02-20 – 2020-02-22 (×2): 250 mL via INTRAVENOUS

## 2020-02-09 MED ORDER — OCUVITE-LUTEIN PO TABS: 1.0000 | ORAL_TABLET | Freq: Every day | ORAL | Status: DC

## 2020-02-09 MED ORDER — LEVOTHYROXINE SODIUM 88 MCG PO TABS
88.0000 ug | ORAL_TABLET | Freq: Every day | ORAL | Status: DC
  Administered 2020-02-10: 88 ug via ORAL
  Filled 2020-02-09: qty 1

## 2020-02-09 MED ORDER — SODIUM CHLORIDE 0.9 % IV SOLN
1.0000 g | INTRAVENOUS | Status: DC
  Administered 2020-02-10 – 2020-02-13 (×4): 1 g via INTRAVENOUS
  Filled 2020-02-09 (×4): qty 10

## 2020-02-09 MED ORDER — AMLODIPINE BESYLATE 5 MG PO TABS
10.0000 mg | ORAL_TABLET | Freq: Every day | ORAL | Status: DC
  Administered 2020-02-09 – 2020-02-19 (×11): 10 mg via ORAL
  Filled 2020-02-09 (×11): qty 2

## 2020-02-09 MED ORDER — PREDNISONE 20 MG PO TABS: 40.0000 mg | ORAL_TABLET | Freq: Once | ORAL | Status: DC

## 2020-02-09 MED ORDER — BENZONATATE 100 MG PO CAPS
100.0000 mg | ORAL_CAPSULE | Freq: Three times a day (TID) | ORAL | Status: DC | PRN
  Administered 2020-02-12: 17:00:00 100 mg via ORAL
  Filled 2020-02-09: qty 1

## 2020-02-09 MED ORDER — IPRATROPIUM-ALBUTEROL 0.5-2.5 (3) MG/3ML IN SOLN
3.0000 mL | RESPIRATORY_TRACT | Status: DC | PRN
  Administered 2020-02-10: 3 mL via RESPIRATORY_TRACT
  Filled 2020-02-09: qty 3

## 2020-02-09 MED ORDER — ACETAMINOPHEN 325 MG PO TABS
650.0000 mg | ORAL_TABLET | Freq: Four times a day (QID) | ORAL | Status: DC | PRN
  Administered 2020-02-18 (×2): 650 mg via ORAL
  Filled 2020-02-09 (×2): qty 2

## 2020-02-09 MED ORDER — SODIUM CHLORIDE 0.9% FLUSH: 3.0000 mL | INTRAVENOUS | Status: DC | PRN

## 2020-02-09 MED ORDER — HYDROCODONE-ACETAMINOPHEN 10-325 MG PO TABS
1.0000 | ORAL_TABLET | Freq: Four times a day (QID) | ORAL | Status: DC | PRN
  Administered 2020-02-10 – 2020-02-15 (×4): 1 via ORAL
  Filled 2020-02-09 (×4): qty 1

## 2020-02-09 MED ORDER — POTASSIUM CHLORIDE CRYS ER 20 MEQ PO TBCR
20.0000 meq | EXTENDED_RELEASE_TABLET | Freq: Every day | ORAL | Status: DC
  Administered 2020-02-10 – 2020-02-11 (×2): 20 meq via ORAL
  Filled 2020-02-09 (×2): qty 1

## 2020-02-09 MED ORDER — HEPARIN SODIUM (PORCINE) 5000 UNIT/ML IJ SOLN
5000.0000 [IU] | Freq: Three times a day (TID) | INTRAMUSCULAR | Status: DC
  Administered 2020-02-09 – 2020-02-19 (×30): 5000 [IU] via SUBCUTANEOUS
  Filled 2020-02-09 (×30): qty 1

## 2020-02-09 MED ORDER — INSULIN ASPART 100 UNIT/ML ~~LOC~~ SOLN: 0.0000 [IU] | Freq: Three times a day (TID) | SUBCUTANEOUS | Status: DC

## 2020-02-09 MED ORDER — METHYLPREDNISOLONE SODIUM SUCC 40 MG IJ SOLR
40.0000 mg | Freq: Two times a day (BID) | INTRAMUSCULAR | Status: DC
  Administered 2020-02-09 – 2020-02-10 (×2): 40 mg via INTRAVENOUS
  Filled 2020-02-09 (×2): qty 1

## 2020-02-09 MED ORDER — ZOLPIDEM TARTRATE 5 MG PO TABS
5.0000 mg | ORAL_TABLET | Freq: Every evening | ORAL | Status: DC | PRN
  Administered 2020-02-09 – 2020-02-15 (×6): 5 mg via ORAL
  Filled 2020-02-09 (×6): qty 1

## 2020-02-09 MED ORDER — ASPIRIN EC 81 MG PO TBEC
81.0000 mg | DELAYED_RELEASE_TABLET | Freq: Every day | ORAL | Status: DC
  Administered 2020-02-10 – 2020-02-19 (×10): 81 mg via ORAL
  Filled 2020-02-09 (×10): qty 1

## 2020-02-09 MED ORDER — POLYETHYLENE GLYCOL 3350 17 G PO PACK
17.0000 g | PACK | Freq: Every day | ORAL | Status: DC | PRN
  Administered 2020-02-15: 17 g via ORAL
  Filled 2020-02-09: qty 1

## 2020-02-09 MED ORDER — HYDROCHLOROTHIAZIDE 25 MG PO TABS: 25.0000 mg | ORAL_TABLET | Freq: Every day | ORAL | Status: DC

## 2020-02-09 MED ORDER — GABAPENTIN 400 MG PO CAPS
400.0000 mg | ORAL_CAPSULE | Freq: Three times a day (TID) | ORAL | Status: DC
  Administered 2020-02-09 – 2020-02-13 (×11): 400 mg via ORAL
  Filled 2020-02-09 (×11): qty 1

## 2020-02-09 MED ORDER — TIMOLOL MALEATE 0.5 % OP SOLN
1.0000 [drp] | Freq: Two times a day (BID) | OPHTHALMIC | Status: DC
  Administered 2020-02-09 – 2020-02-23 (×27): 1 [drp] via OPHTHALMIC
  Filled 2020-02-09 (×2): qty 5

## 2020-02-09 MED ORDER — GABAPENTIN 300 MG PO CAPS: 600.0000 mg | ORAL_CAPSULE | Freq: Three times a day (TID) | ORAL | Status: DC

## 2020-02-09 MED ORDER — PROSIGHT PO TABS
1.0000 | ORAL_TABLET | Freq: Every day | ORAL | Status: DC
  Filled 2020-02-09 (×3): qty 1

## 2020-02-09 MED ORDER — ALBUTEROL SULFATE HFA 108 (90 BASE) MCG/ACT IN AERS
4.0000 | INHALATION_SPRAY | Freq: Once | RESPIRATORY_TRACT | Status: AC
  Administered 2020-02-09: 4 via RESPIRATORY_TRACT

## 2020-02-09 MED ORDER — POTASSIUM CHLORIDE CRYS ER 20 MEQ PO TBCR
20.0000 meq | EXTENDED_RELEASE_TABLET | Freq: Once | ORAL | Status: AC
  Administered 2020-02-09: 20 meq via ORAL
  Filled 2020-02-09: qty 1

## 2020-02-09 MED ORDER — ONDANSETRON HCL 4 MG/2ML IJ SOLN: 4.0000 mg | Freq: Four times a day (QID) | INTRAMUSCULAR | Status: DC | PRN

## 2020-02-09 MED ORDER — ONDANSETRON HCL 4 MG PO TABS: 4.0000 mg | ORAL_TABLET | Freq: Four times a day (QID) | ORAL | Status: DC | PRN

## 2020-02-09 MED ORDER — SODIUM CHLORIDE 0.9 % IV SOLN
500.0000 mg | INTRAVENOUS | Status: DC
  Administered 2020-02-10 – 2020-02-13 (×4): 500 mg via INTRAVENOUS
  Filled 2020-02-09 (×4): qty 500

## 2020-02-09 MED ORDER — BISACODYL 10 MG RE SUPP: 10.0000 mg | Freq: Every day | RECTAL | Status: DC | PRN

## 2020-02-09 MED ORDER — SIMVASTATIN 20 MG PO TABS
20.0000 mg | ORAL_TABLET | Freq: Every day | ORAL | Status: DC
  Administered 2020-02-10 – 2020-02-19 (×10): 20 mg via ORAL
  Filled 2020-02-09 (×10): qty 2

## 2020-02-09 MED ORDER — SODIUM CHLORIDE 0.9% FLUSH
3.0000 mL | Freq: Two times a day (BID) | INTRAVENOUS | Status: DC
  Administered 2020-02-09 – 2020-02-23 (×27): 3 mL via INTRAVENOUS

## 2020-02-09 MED ORDER — DEXAMETHASONE SODIUM PHOSPHATE 10 MG/ML IJ SOLN
10.0000 mg | Freq: Once | INTRAMUSCULAR | Status: AC
  Administered 2020-02-09: 10 mg via INTRAVENOUS
  Filled 2020-02-09: qty 1

## 2020-02-09 MED ORDER — ALBUTEROL SULFATE HFA 108 (90 BASE) MCG/ACT IN AERS: 2.0000 | INHALATION_SPRAY | RESPIRATORY_TRACT | Status: DC | PRN

## 2020-02-09 MED ORDER — SODIUM CHLORIDE 0.9 % IV SOLN
1.0000 g | Freq: Once | INTRAVENOUS | Status: AC
  Administered 2020-02-09: 1 g via INTRAVENOUS
  Filled 2020-02-09: qty 10

## 2020-02-09 MED ORDER — FUROSEMIDE 40 MG PO TABS: 20.0000 mg | ORAL_TABLET | Freq: Every day | ORAL | Status: DC

## 2020-02-09 MED ORDER — SODIUM CHLORIDE 0.9 % IV SOLN
500.0000 mg | Freq: Once | INTRAVENOUS | Status: AC
  Administered 2020-02-09: 500 mg via INTRAVENOUS
  Filled 2020-02-09: qty 500

## 2020-02-09 MED ORDER — ALBUTEROL SULFATE HFA 108 (90 BASE) MCG/ACT IN AERS
4.0000 | INHALATION_SPRAY | Freq: Once | RESPIRATORY_TRACT | Status: AC
  Administered 2020-02-09: 4 via RESPIRATORY_TRACT
  Filled 2020-02-09: qty 6.7

## 2020-02-09 MED ORDER — VITAMIN B-12 1000 MCG PO TABS
1000.0000 ug | ORAL_TABLET | Freq: Every day | ORAL | Status: DC
  Administered 2020-02-10 – 2020-02-19 (×10): 1000 ug via ORAL
  Filled 2020-02-09 (×10): qty 1

## 2020-02-09 MED ORDER — GUAIFENESIN-DM 100-10 MG/5ML PO SYRP
10.0000 mL | ORAL_SOLUTION | ORAL | Status: DC | PRN
  Administered 2020-02-09 – 2020-02-12 (×4): 10 mL via ORAL
  Filled 2020-02-09 (×4): qty 10

## 2020-02-09 MED ORDER — ACETAMINOPHEN 650 MG RE SUPP: 650.0000 mg | Freq: Four times a day (QID) | RECTAL | Status: DC | PRN

## 2020-02-09 MED ORDER — ALPRAZOLAM 0.5 MG PO TABS
0.5000 mg | ORAL_TABLET | Freq: Every evening | ORAL | Status: DC | PRN
  Administered 2020-02-10 – 2020-02-15 (×2): 0.5 mg via ORAL
  Filled 2020-02-09 (×2): qty 1

## 2020-02-09 NOTE — ED Provider Notes (Signed)
[name] Provider Note   CSN: [id number] Arrival date & time: 01/15/2020  1211     History Chief Complaint  Patient presents with  . Hypoglycemia    Patient hypertensive    [name] [name] is a 74 y.o. female with a history of chronic bronchitis, anemia, CHF, CKD, diabetes, HTN and history of aortic stenosis presenting with a 3 day history of generalized weakness with shortness of breath and wheezing.  Additionally, she tripped and fell this morning with a minor hit to her posterior head against a door with the fall. She denies LOC, scalp injury or pain, dizziness or headache since the event.  She reports waking this am feeling weak and when she checked her cbg it was 50.  She promptly ate a meal and repeat cbg (checked by EMS when called out) was 118.  She additionally reports increased sob with coughing which has been wet but mostly nonproductive, occasional clear sputum along with wheezing.  She has been using her albuterol inhalers without improvement in the wheezing.  Her nebulizer machine has been broken for about 2 months. She denies chest pain, increase in peripheral edema,   The history is provided by the patient.  Hypoglycemia Associated symptoms: shortness of breath   Associated symptoms: no dizziness, no vomiting and no weakness        Past Medical History:  Diagnosis Date  . Anemia   . Anxiety   . Arthritis   . Blood transfusion without reported diagnosis   . Bronchitis   . CHF (congestive heart failure) ([place])   . CKD (chronic kidney disease) stage 3, GFR 30-59 ml/min 08/18/2018  . Depression   . Diabetes mellitus   . Diverticulitis   . Glaucoma   . Hyperlipidemia   . Hypertension   . Hypothyroidism   . Macular degeneration   . Melanoma ([place])    left thigh, 2010  . Osteoarthritis   . Peptic ulcer   . Rheumatoid arthritis(714.0)   . 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 Severe aortic stenosis     Patient Active Problem List   Diagnosis Date Noted  . Acute blood loss anemia 01/27/2019  . Acute on chronic renal failure ([place]) 01/25/2019  . Hypokalemia   . Leukocytosis   . Metabolic encephalopathy   . Chronic diastolic congestive heart failure ([place])   . Diabetes mellitus type 2 in obese ([place])   . Embolic stroke ([place]) 51/88/4166  . AKI (acute kidney injury) ([place]) 01/15/2019  . Drug toxicity 01/15/2019  . Intracranial carotid stenosis 01/15/2019  . 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 Severe aortic stenosis   . Rheumatoid arthritis ([place])   . Hypothyroidism   . Diabetes mellitus, type 2 ([place])   . Chronic diastolic CHF (congestive heart failure) ([place]) 08/18/2018  . CKD (chronic kidney disease) stage 3, GFR 30-59 ml/min 08/18/2018  . Frequent falls 02/02/2018  . History of gastric ulcer 10/16/2016  . Dysphagia 10/16/2016  . Spondylolisthesis of lumbar region 08/03/2014  . Essential hypertension 08/02/2014  . Hyperlipidemia 08/02/2014  . Primary osteoarthritis of both knees 06/14/2014  . Right leg weakness 10/31/2013  . H/O adenomatous polyp of colon 08/17/2012  . Family history of colon cancer 08/17/2012  . Chronic nausea 08/17/2012    Past Surgical History:  Procedure Laterality Date  . ABDOMINAL HYSTERECTOMY  1988  .  APPENDECTOMY    . BACK SURGERY  07/2008   fusion  . BACK SURGERY  05/1606   complicated by post-op anemia requiring transfusion  . BREAST SURGERY     right breast biopsy  . CARDIAC CATHETERIZATION    . CATARACT EXTRACTION    . CHOLECYSTECTOMY  1974  . COLONOSCOPY  06/06/2007   Proximal diminutive rectal polyps cold biopsied/removed, otherwise normal rectum/ Left-sided diverticula, the remainder of the colonic mucosa appeared normal. Path-tubular adnemoa.   . COLONOSCOPY  09/14/2012   PXT:GGYIRS polyps-removed as described  above  Colonic diverticulosis. Poor prep. next TCS five years.   . COLONOSCOPY N/A 10/29/2016   Procedure: COLONOSCOPY;  Surgeon: Daneil Dolin, MD;  [facility];  Service: Endoscopy;  Laterality: N/A;  2:00 pm  . ESOPHAGOGASTRODUODENOSCOPY  09/2012   Rourk: gastric ulcer, gastric duodenal erosions, small hh. benign bx  . ESOPHAGOGASTRODUODENOSCOPY N/A 10/29/2016   Procedure: ESOPHAGOGASTRODUODENOSCOPY (EGD);  Surgeon: Daneil Dolin, MD;  [facility];  Service: Endoscopy;  Laterality: N/A;  . EYE SURGERY    . FOOT SURGERY  1994   left   . LUMBAR SPINE SURGERY  08/04/2014   dr Joya Salm  . macular degen    . MALONEY DILATION N/A 10/29/2016   Procedure: Venia Minks DILATION;  Surgeon: Daneil Dolin, MD;  [facility];  Service: Endoscopy;  Laterality: N/A;  . MELANOMA EXCISION  2010  . MULTIPLE EXTRACTIONS WITH ALVEOLOPLASTY N/A 08/17/2018   Procedure: Extraction of tooth #'s 7,8, and 9 with alveoloplasty and gross debridement of remaining teeth;  Surgeon: Lenn Cal, DDS;  Location: [facility];  Service: Oral Surgery;  Laterality: N/A;  . RIGHT/LEFT HEART CATH AND CORONARY ANGIOGRAPHY N/A 08/11/2018   Procedure: RIGHT/LEFT HEART CATH AND CORONARY ANGIOGRAPHY;  Surgeon: Burnell Blanks, MD;  Location: [facility];  Service: Cardiovascular;  Laterality: N/A;  . TEE WITHOUT CARDIOVERSION N/A 01/10/2019   Procedure: TRANSESOPHAGEAL ECHOCARDIOGRAM (TEE);  Surgeon: Burnell Blanks, MD;  Location: [facility];  Service: Open Heart Surgery;  Laterality: N/A;  . TRANSCATHETER AORTIC VALVE REPLACEMENT, TRANSFEMORAL N/A 01/10/2019   Procedure: TRANSCATHETER AORTIC VALVE REPLACEMENT, TRANSFEMORAL;  Surgeon: Burnell Blanks, MD;  Location: [facility];  Service: Open Heart Surgery;  Laterality: N/A;  . TRANSCATHETER AORTIC VALVE REPLACEMENT, TRANSFEMORAL  12/2018  . YAG LASER APPLICATION     OS     OB History   No obstetric history on  file.     Family History  Problem Relation Age of Onset  . Colon cancer Brother        diagnosed at age 25  . Diabetes Brother   . Hypertension Mother   . Heart failure Mother   . Diabetes Mother   . Glaucoma Mother   . Hypertension Other   . Other Other   . Heart failure Father        passed away of MI in his 48s  . Diabetes Sister   . Hypertension Sister   . Breast cancer Sister   . Heart attack Brother   . Diabetes Sister   . Hypertension Sister   . Other Sister        tumor  . Hypertension Sister   . Hypertension Daughter   . Bipolar disorder Daughter   . Anesthesia problems Neg Hx   . Amblyopia Neg Hx   . Blindness Neg Hx   . Cataracts Neg Hx   . Macular  degeneration Neg Hx   . Retinal detachment Neg Hx   . Strabismus Neg Hx   . Retinitis pigmentosa Neg Hx     Social History   Tobacco Use  . Smoking status: Never Smoker  . Smokeless tobacco: Never Used  Substance Use Topics  . Alcohol use: No    Comment: "Wine a couple times a month"  . Drug use: No    Home Medications Prior to Admission medications   Medication Sig Start Date End Date Taking? Authorizing Provider  acetaminophen (TYLENOL) 325 MG tablet Take 1-2 tablets (325-650 mg total) by mouth every 4 (four) hours as needed for mild pain. 01/25/19  Yes Love, Ivan Anchors, PA-C  ALPRAZolam Duanne Moron) 0.5 MG tablet Take 0.5 mg by mouth at bedtime as needed for sleep.    Yes [provider]  amLODipine (NORVASC) 10 MG tablet Take 1 tablet by mouth once daily 09/21/19  Yes Herminio Commons, MD  aspirin EC 81 MG tablet Take 81 mg by mouth daily.    Yes [provider]  beta carotene w/minerals (OCUVITE) tablet Take 1 tablet by mouth daily.   Yes [provider]  fluticasone (FLONASE) 50 MCG/ACT nasal spray Place into both nostrils daily as needed for allergies or rhinitis.   Yes [provider]  furosemide (LASIX) 20 MG tablet Take 20 mg by mouth daily. 01/19/20  Yes [provider]  gabapentin (NEURONTIN) 300 MG capsule Take 900 mg by mouth 3 (three) times daily. 01/18/20  Yes [provider]  glipiZIDE (GLUCOTROL) 10 MG tablet Take 1 tablet (10 mg total) by mouth 2 (two) times daily before a meal. 01/25/19  Yes Love, Ivan Anchors, PA-C  hydrochlorothiazide (HYDRODIURIL) 25 MG tablet Take 25 mg by mouth daily. 01/18/20  Yes [provider]  HYDROcodone-acetaminophen (NORCO) 10-325 MG tablet Take 1 tablet by mouth 4 (four) times daily. 01/30/20  Yes [provider]  JARDIANCE 10 MG TABS tablet Take 10 mg by mouth every morning. 01/18/20  Yes [provider]  levothyroxine (SYNTHROID, LEVOTHROID) 88 MCG tablet Take 1 tablet (88 mcg total) by mouth daily. 01/25/19  Yes Love, Ivan Anchors, PA-C  metFORMIN (GLUCOPHAGE) 500 MG tablet Take 1 tablet (500 mg total) by mouth 2 (two) times daily with a meal. No refills 08/30/19  Yes Herminio Commons, MD  potassium chloride SA (K-DUR,KLOR-CON) 20 MEQ tablet Take 1 tablet (20 mEq total) by mouth daily. 01/26/19  Yes Love, Ivan Anchors, PA-C  simvastatin (ZOCOR) 20 MG tablet Take 20 mg by mouth daily. 01/18/20  Yes [provider]  timolol (TIMOPTIC) 0.5 % ophthalmic solution Place 1 drop into both eyes 2 (two) times daily.    Yes [provider]  VENTOLIN HFA 108 (90 Base) MCG/ACT inhaler Inhale 2 puffs into the lungs every 4 (four) hours as needed for wheezing or shortness of breath.  06/22/18  Yes [provider]  vitamin B-12 (CYANOCOBALAMIN) 1000 MCG tablet Take 1,000 mcg by mouth daily.   Yes [provider]  zolpidem (AMBIEN) 5 MG tablet Take 5 mg by mouth at bedtime as needed. 01/20/20  Yes [provider]    Allergies    Patient has no known allergies.  Review of Systems   Review of Systems  Constitutional: Positive for fatigue. Negative for chills and fever.  HENT: Negative for congestion and sore throat.   Eyes: Negative.   Respiratory: Positive for  cough, chest tightness, shortness of breath and wheezing.  Cardiovascular: Negative for chest pain, palpitations and leg swelling.  Gastrointestinal: Negative for abdominal pain, nausea and vomiting.  Genitourinary: Negative.   Musculoskeletal: Negative for arthralgias, joint swelling and neck pain.  Skin: Negative.  Negative for rash and wound.  Neurological: Negative for dizziness, weakness, light-headedness, numbness and headaches.  Psychiatric/Behavioral: Negative.     Physical Exam Updated Vital Signs BP (!) 138/52   Pulse 77   Temp 97.7 F (36.5 C) (Oral)   Resp 17   Ht 4\' 9"  (1.448 m)   Wt 67.6 kg   SpO2 91%   BMI 32.25 kg/m   Physical Exam Vitals and nursing note reviewed.  Constitutional:      General: She is not in acute distress.    Appearance: She is well-developed.  HENT:     Head: Normocephalic and atraumatic.  Eyes:     Conjunctiva/sclera: Conjunctivae normal.  Cardiovascular:     Rate and Rhythm: Normal rate and regular rhythm.     Pulses: Normal pulses.     Heart sounds: Normal heart sounds.  Pulmonary:     Effort: Pulmonary effort is normal.     Breath sounds: Decreased breath sounds and wheezing present. No rhonchi or rales.     Comments: Inspiratory and expiratory wheezing throughout all lung fields with prolonged expirations.  No rhonchi or rales present. Abdominal:     General: Bowel sounds are normal.     Palpations: Abdomen is soft.     Tenderness: There is no abdominal tenderness.  Musculoskeletal:        General: Normal range of motion.     Cervical back: Normal range of motion.     Comments: Trace bilateral edema.  Skin:    General: Skin is warm and dry.  Neurological:     Mental Status: She is alert.     ED Results / Procedures / Treatments   Labs (all labs ordered are listed, but only abnormal results are displayed) Labs Reviewed  CBC WITH DIFFERENTIAL/PLATELET - Abnormal; Notable for the following components:      Result Value    WBC 11.7 (*)    Hemoglobin 11.7 (*)    Neutro Abs 8.4 (*)    All other components within normal limits  BASIC METABOLIC PANEL - Abnormal; Notable for the following components:   Potassium 3.2 (*)    Glucose, Bld 134 (*)    Creatinine, Ser 2.01 (*)    GFR calc non Af Amer 24 (*)    GFR calc Af Amer 28 (*)    All other components within normal limits  URINALYSIS, ROUTINE W REFLEX MICROSCOPIC - Abnormal; Notable for the following components:   APPearance HAZY (*)    Glucose, UA >=500 (*)    Protein, ur >=300 (*)    All other components within normal limits  CBG MONITORING, ED - Abnormal; Notable for the following components:   Glucose-Capillary 124 (*)    All other components within normal limits  SARS CORONAVIRUS 2 (TAT 6-24 HRS)  POC SARS CORONAVIRUS 2 AG -  ED    EKG None  Radiology DG Chest Portable 1 View  Result Date: 02/10/2020 CLINICAL DATA:  Wheezing EXAM: PORTABLE CHEST 1 VIEW COMPARISON:  08/29/2019 FINDINGS: Stable mild cardiomegaly status post TAVR. Calcific aortic knob. Bilateral interstitial opacities, right worse than left with areas of subtle nodularity most prominent in the right upper lobe. No pleural effusion or pneumothorax. IMPRESSION: Bilateral interstitial opacities, right worse than left with areas of subtle nodularity  most prominent in the right upper lobe. Findings could reflect atypical or viral pneumonia. Radiographic follow-up to resolution is recommended. Electronically Signed   By: Davina Poke D.O.   On: 02/03/2020 14:46    Procedures Procedures (including critical care time)  Medications Ordered in ED Medications  azithromycin (ZITHROMAX) 500 mg in sodium chloride 0.9 % 250 mL IVPB (500 mg Intravenous New Bag/Given 02/08/2020 1952)  albuterol (VENTOLIN HFA) 108 (90 Base) MCG/ACT inhaler 4 puff (4 puffs Inhalation Given 01/29/2020 1439)  dexamethasone (DECADRON) injection 10 mg (10 mg Intravenous Given 02/04/2020 1747)  cefTRIAXone (ROCEPHIN) 1 g in  sodium chloride 0.9 % 100 mL IVPB (1 g Intravenous New Bag/Given 01/17/2020 1824)  albuterol (VENTOLIN HFA) 108 (90 Base) MCG/ACT inhaler 4 puff (4 puffs Inhalation Given 01/30/2020 1838)  potassium chloride SA (KLOR-CON) CR tablet 20 mEq (20 mEq Oral Given 01/22/2020 1838)    ED Course  I have reviewed the triage vital signs and the nursing notes.  Pertinent labs & imaging results that were available during my care of the patient were reviewed by me and considered in my medical decision making (see chart for details).    MDM Rules/Calculators/A&P                     Patient with acute exacerbation of her chronic bronchitis with significant wheezing and shortness of breath.  She was given multiple MDI breathing treatments here in addition to IV steroids with persistent wheezing and hypoxia to 83% with ambulation on room air but with comfortable breathing and oxygen saturation in the 93 to 94% range on 2 L.  Her chest x-ray suggests viral or atypical pneumonia.  She was given Rocephin and Zithromax here for coverage of community-acquired pneumonia.  Her point-of-care Covid test is negative, her confirmatory test is pending.  Discussed with Dr. Scherrie November who accepts pt for admission.    Final Clinical Impression(s) / ED Diagnoses Final diagnoses:  Community acquired pneumonia, unspecified laterality  Wheezing  Hypoxia    Rx / DC Orders ED Discharge Orders    None       Landis Martins 01/22/2020 2046    Noemi Chapel, MD 01/28/2020 2135

## 2020-02-09 NOTE — ED Triage Notes (Signed)
Per patient, has fell twice in last day and hit left side of head on door this morning.

## 2020-02-09 NOTE — ED Triage Notes (Signed)
[facility] called out for hypoglycemia, B/S 118, pt hypertensive.

## 2020-02-09 NOTE — H&P (Signed)
History and Physical    Patient Demographics:    [name] [name] XQJ:[id number] DOB: [birth year] DOA: 01/29/2020  PCP: Redmond School, MD  Patient coming from: Home  I have personally briefly reviewed patient's old medical records in [place]  Chief Complaint: Shortness of breath   Assessment & Plan:     Assessment/Plan Principal Problem:   Acute exacerbation of chronic bronchitis ([place]) Active Problems:   Essential hypertension   Hyperlipidemia   History of gastric ulcer   Chronic diastolic CHF (congestive heart failure) (HCC)   CKD (chronic kidney disease) stage 3, GFR 30-59 ml/min   Severe aortic stenosis   Rheumatoid arthritis ([place])   Hypothyroidism   S/P TAVR (transcatheter aortic valve replacement)     Principal Problem: Acute Hypoxemic respiratory failure Patient had hypoxia on presentation with saturation dropping into the 80s on ambulation.  She is noted to be comfortable on 2 L nasal cannula.  Shortness of breath appears to be multifactorial likely secondary to exacerbation of chronic bronchitis with superimposed pneumonia. -Oxygen as needed -IV antibiotics -Nebulizers and steroids  Other Active Problems: Possible Community acquired pneumonia Patient presented with cough, shortness of breath, wheezing.  His underlying history of chronic bronchitis.  Chest x-ray shows bilateral interstitial opacities with worsening infiltrates concerning for pneumonia. -We will empirically cover with IV antibiotics -We will send CRP, procalcitonin -sputum culture -monitor CBC, temp  Acute exacerbation of Chronic Bronchitis Patient apparently has history of chronic bronchitis and uses inhalers at home.  Chest x-ray shows bilateral interstitial opacities which appear to have worsened. -Nebulizers and steroids  Chronic kidney disease stage III: Creatinine is 2.0 on presentation.  Baseline appears to be somewhere between 1.5-2.   -We will monitor input output, renal  function closely  History of severe aortic stenosis s/p TAVR: Patient had TAVR in January 2020. Post operative echoshowed EF 60% with normally functioning TAVR with mean gradient 11 mmHg and mild PVL.  She subsequently was lost to follow-up and has not seen cardiology. -We will repeat echocardiogram -Continue home dose of Lasix  History of embolic CVA: Currently no residual deficits -Continue aspirin  Bilateral carotid stenosis: CT angio of the neck showed moderate to severe right ICA and moderate left ICA stenosis in February 2020.  She had been referred to vascular surgery as outpatient but has not followed up. -Referral to vascular surgery as outpatient and plan for repeat carotid ultrasound  Hypertension: -Continue amlodipine  Hyperlipidemia: -Continue simvastatin  Diabetes mellitus type 2: With diabetic neuropathy Initially presenting with hypoglycemia.  Glucose is currently normal. -Sliding scale insulin coverage with fingerstick monitoring -Hold oral hypoglycemics -Continue gabapentin  Hypothyroidism: -Continue levothyroxine  Acute on chronic debility with recurrent falls  Had a fall this AM with minimal head injury and no sequelae, no syncope No focal deficits on exam Concern for hypoglycemia vs orthostatic hypotension -monitor fingersticks -orthostatic vital signs -Will get PT/OT evaluation  DVT prophylaxis: Heparin Code Status:  Full code Family Communication: N/A  Disposition Plan: Admitted as inpatient for treatment of chronic bronchitis  Consults called: N/A Admission status: inpatient stay    HPI:     HPI: [name] [name] is a 74 y.o. female with medical history significant of hypertension, hyperlipidemia, hypothyroidism, type 2 diabetes mellitus, CVA, chronic bronchitis, chronic kidney disease stage III, rheumatoid arthritis, and severe aortic stenosis status post TAVR on 01/10/2019 who presented to the ER with worsening shortness of breath,  weakness.  Patient states that she had increased weakness over the  last 3 days and is also been wheezing and having shortness of breath.  She had associated cough with clear sputum.  She says she used her albuterol inhaler at home multiple times with no relief.  She does have a nebulizer machine but it has been broken for about the past 2 months.  She says earlier today she felt so weak when she tried to walk that she got dizzy and sustained a fall with minor hit to the head against a door but no obvious injuries.  She did not have any loss of consciousness, focal deficits, blurring of vision.  She reported she had been hypoglycemic this morning when she woke up and her blood glucose was 50. ED Course:  Vital Signs reviewed on presentation, significant for temperature 97.7, blood pressure 142/52, heart rate 81, saturation 93% on room air at rest, dropping to the 80s on ambulation. Labs reviewed, significant for sodium 138, potassium 3.2, BUN 23, creatinine 2.01, glucose 134, WBC 11.7, hemoglobin 11.7, hematocrit 36, platelets 366.  SARS COVID-19 screen is negative.  Urinalysis shows glucose, protein, negative for bacteria or WBCs. Imaging personally Reviewed, chest x-ray shows bilateral interstitial opacities, right worse than left with areas of subtle nodularity most prominent in the right upper lobe.  Findings likely secondary to atypical viral pneumonia.    Review of systems:    Review of Systems: As per HPI otherwise 10 point review of systems negative.  All other review of systems is negative except the ones noted above in the HPI.    Past Medical and Surgical History:  Reviewed by me  Past Medical History:  Diagnosis Date  . Anemia   . Anxiety   . Arthritis   . Blood transfusion without reported diagnosis   . Bronchitis   . CHF (congestive heart failure) ([place])   . CKD (chronic kidney disease) stage 3, GFR 30-59 ml/min 08/18/2018  . Depression   . Diabetes mellitus   . Diverticulitis    . Glaucoma   . Hyperlipidemia   . Hypertension   . Hypothyroidism   . Macular degeneration   . Melanoma ([place])    left thigh, 2010  . Osteoarthritis   . Peptic ulcer   . Rheumatoid arthritis(714.0)   . 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 Severe aortic stenosis     Past Surgical History:  Procedure Laterality Date  . ABDOMINAL HYSTERECTOMY  1988  . APPENDECTOMY    . BACK SURGERY  07/2008   fusion  . BACK SURGERY  04/2840   complicated by post-op anemia requiring transfusion  . BREAST SURGERY     right breast biopsy  . CARDIAC CATHETERIZATION    . CATARACT EXTRACTION    . CHOLECYSTECTOMY  1974  . COLONOSCOPY  06/06/2007   Proximal diminutive rectal polyps cold biopsied/removed, otherwise normal rectum/ Left-sided diverticula, the remainder of the colonic mucosa appeared normal. Path-tubular adnemoa.   . COLONOSCOPY  09/14/2012   LKG:MWNUUV polyps-removed as described  above Colonic diverticulosis. Poor prep. next TCS five years.   . COLONOSCOPY N/A 10/29/2016   Procedure: COLONOSCOPY;  Surgeon: Daneil Dolin, MD;  [facility];  Service: Endoscopy;  Laterality: N/A;  2:00 pm  . ESOPHAGOGASTRODUODENOSCOPY  09/2012   Rourk: gastric ulcer, gastric duodenal erosions, small hh. benign bx  . ESOPHAGOGASTRODUODENOSCOPY N/A 10/29/2016   Procedure: ESOPHAGOGASTRODUODENOSCOPY (EGD);  Surgeon: Daneil Dolin,  MD;  [facility];  Service: Endoscopy;  Laterality: N/A;  . EYE SURGERY    . FOOT SURGERY  1994   left   . LUMBAR SPINE SURGERY  08/04/2014   dr Joya Salm  . macular degen    . MALONEY DILATION N/A 10/29/2016   Procedure: Venia Minks DILATION;  Surgeon: Daneil Dolin, MD;  [facility];  Service: Endoscopy;  Laterality: N/A;  . MELANOMA EXCISION  2010  . MULTIPLE EXTRACTIONS WITH ALVEOLOPLASTY N/A 08/17/2018   Procedure:  Extraction of tooth #'s 7,8, and 9 with alveoloplasty and gross debridement of remaining teeth;  Surgeon: Lenn Cal, DDS;  Location: [facility];  Service: Oral Surgery;  Laterality: N/A;  . RIGHT/LEFT HEART CATH AND CORONARY ANGIOGRAPHY N/A 08/11/2018   Procedure: RIGHT/LEFT HEART CATH AND CORONARY ANGIOGRAPHY;  Surgeon: Burnell Blanks, MD;  Location: [facility];  Service: Cardiovascular;  Laterality: N/A;  . TEE WITHOUT CARDIOVERSION N/A 01/10/2019   Procedure: TRANSESOPHAGEAL ECHOCARDIOGRAM (TEE);  Surgeon: Burnell Blanks, MD;  Location: [facility];  Service: Open Heart Surgery;  Laterality: N/A;  . TRANSCATHETER AORTIC VALVE REPLACEMENT, TRANSFEMORAL N/A 01/10/2019   Procedure: TRANSCATHETER AORTIC VALVE REPLACEMENT, TRANSFEMORAL;  Surgeon: Burnell Blanks, MD;  Location: [facility];  Service: Open Heart Surgery;  Laterality: N/A;  . TRANSCATHETER AORTIC VALVE REPLACEMENT, TRANSFEMORAL  12/2018  . YAG LASER APPLICATION     OS     Social History:  Reviewed by me   reports that she has never smoked. She has never used smokeless tobacco. She reports that she does not drink alcohol or use drugs.  Allergies:    No Known Allergies  Family History :   Family History  Problem Relation Age of Onset  . Colon cancer Brother        diagnosed at age 75  . Diabetes Brother   . Hypertension Mother   . Heart failure Mother   . Diabetes Mother   . Glaucoma Mother   . Hypertension Other   . Other Other   . Heart failure Father        passed away of MI in his 80s  . Diabetes Sister   . Hypertension Sister   . Breast cancer Sister   . Heart attack Brother   . Diabetes Sister   . Hypertension Sister   . Other Sister        tumor  . Hypertension Sister   . Hypertension Daughter   . Bipolar disorder Daughter   . Anesthesia problems Neg Hx   . Amblyopia Neg Hx   . Blindness Neg Hx   . Cataracts Neg Hx   . Macular degeneration Neg Hx     . Retinal detachment Neg Hx   . Strabismus Neg Hx   . Retinitis pigmentosa Neg Hx    Family history reviewed, noted as above, not pertinent to current presentation.   Home Medications:    Prior to Admission medications   Medication Sig Start Date End Date Taking? Authorizing Provider  acetaminophen (TYLENOL) 325 MG tablet Take 1-2 tablets (325-650 mg total) by mouth every 4 (four) hours as needed for mild pain. 01/25/19  Yes Love, Ivan Anchors, PA-C  ALPRAZolam Duanne Moron) 0.5 MG tablet Take 0.5 mg by mouth at bedtime as needed for sleep.    Yes [provider]  amLODipine (NORVASC) 10 MG tablet Take 1 tablet by mouth once daily 09/21/19  Yes Herminio Commons, MD  aspirin EC 81 MG tablet Take 81 mg by mouth daily.    Yes [provider]  beta carotene w/minerals (OCUVITE) tablet Take 1 tablet by mouth daily.   Yes [provider]  fluticasone (FLONASE) 50 MCG/ACT nasal spray Place into both nostrils daily as needed for allergies or rhinitis.   Yes [provider]  furosemide (LASIX) 20 MG tablet Take 20 mg by mouth daily. 01/19/20  Yes [provider]  gabapentin (NEURONTIN) 300 MG capsule Take 900 mg by mouth 3 (three) times daily. 01/18/20  Yes [provider]  glipiZIDE (GLUCOTROL) 10 MG tablet Take 1 tablet (10 mg total) by mouth 2 (two) times daily before a meal. 01/25/19  Yes Love, Ivan Anchors, PA-C  hydrochlorothiazide (HYDRODIURIL) 25 MG tablet Take 25 mg by mouth daily. 01/18/20  Yes [provider]  HYDROcodone-acetaminophen (NORCO) 10-325 MG tablet Take 1 tablet by mouth 4 (four) times daily. 01/30/20  Yes [provider]  JARDIANCE 10 MG TABS tablet Take 10 mg by mouth every morning. 01/18/20  Yes [provider]  levothyroxine (SYNTHROID, LEVOTHROID) 88 MCG tablet Take 1 tablet (88 mcg total) by mouth daily. 01/25/19  Yes Love, Ivan Anchors, PA-C  metFORMIN (GLUCOPHAGE) 500 MG tablet Take 1 tablet (500 mg total) by mouth 2  (two) times daily with a meal. No refills 08/30/19  Yes Herminio Commons, MD  potassium chloride SA (K-DUR,KLOR-CON) 20 MEQ tablet Take 1 tablet (20 mEq total) by mouth daily. 01/26/19  Yes Love, Ivan Anchors, PA-C  simvastatin (ZOCOR) 20 MG tablet Take 20 mg by mouth daily. 01/18/20  Yes [provider]  timolol (TIMOPTIC) 0.5 % ophthalmic solution Place 1 drop into both eyes 2 (two) times daily.    Yes [provider]  VENTOLIN HFA 108 (90 Base) MCG/ACT inhaler Inhale 2 puffs into the lungs every 4 (four) hours as needed for wheezing or shortness of breath.  06/22/18  Yes [provider]  vitamin B-12 (CYANOCOBALAMIN) 1000 MCG tablet Take 1,000 mcg by mouth daily.   Yes [provider]  zolpidem (AMBIEN) 5 MG tablet Take 5 mg by mouth at bedtime as needed. 01/20/20  Yes [provider]    Physical Exam:    Physical Exam: Vitals:   01/16/2020 1530 02/05/2020 1600 01/27/2020 1630 02/07/2020 1700  BP: (!) 132/51 (!) 141/52 119/68 (!) 138/52  Pulse: 77     Resp:      Temp:      TempSrc:      SpO2: 91%     Weight:      Height:        Constitutional: NAD, calm, comfortable Vitals:   01/17/2020 1530 02/04/2020 1600 02/01/2020 1630 01/30/2020 1700  BP: (!) 132/51 (!) 141/52 119/68 (!) 138/52  Pulse: 77     Resp:      Temp:      TempSrc:      SpO2: 91%     Weight:      Height:       Eyes: PERRL, lids and conjunctivae normal ENMT: Mucous membranes are moist. Posterior pharynx clear of any exudate or lesions.Normal dentition.  Neck: normal, supple, no masses, no thyromegaly Respiratory: clear to auscultation bilaterally, bilateral wheezing, crepitations. Normal respiratory effort. No accessory muscle use.  Cardiovascular: Regular rate and rhythm, ejection systolic murmur 3/6 heard in the aortic area, no rubs / gallops. No extremity edema. 2+ pedal pulses. No carotid bruits.  Abdomen: no tenderness, no masses palpated. No  hepatosplenomegaly. Bowel sounds positive.   Musculoskeletal: no clubbing / cyanosis. No joint deformity upper and lower extremities. Good ROM, no contractures. Normal muscle tone.  Skin: no rashes, lesions, ulcers. No induration Neurologic: CN 2-12 grossly intact. Sensation intact, DTR normal. Strength 5/5 in all 4.  Psychiatric: Normal judgment and insight. Alert and oriented x 3. Normal mood.    Decubitus Ulcers: Not present on admission Catheters and tubes: None  Data Review:    Labs on Admission: I have personally reviewed following labs and imaging studies  CBC: Recent Labs  Lab 01/18/2020 1443  WBC 11.7*  NEUTROABS 8.4*  HGB 11.7*  HCT 36.0  MCV 88.2  PLT 332   Basic Metabolic Panel: Recent Labs  Lab 02/07/2020 1443  NA 138  K 3.2*  CL 99  CO2 27  GLUCOSE 134*  BUN 23  CREATININE 2.01*  CALCIUM 8.9   GFR: Estimated Creatinine Clearance: 19.5 mL/min (A) (by C-G formula based on SCr of 2.01 mg/dL (H)). Liver Function Tests: No results for input(s): AST, ALT, ALKPHOS, BILITOT, PROT, ALBUMIN in the last 168 hours. No results for input(s): LIPASE, AMYLASE in the last 168 hours. No results for input(s): AMMONIA in the last 168 hours. Coagulation Profile: No results for input(s): INR, PROTIME in the last 168 hours. Cardiac Enzymes: No results for input(s): CKTOTAL, CKMB, CKMBINDEX, TROPONINI in the last 168 hours. BNP (last 3 results) No results for input(s): PROBNP in the last 8760 hours. HbA1C: No results for input(s): HGBA1C in the last 72 hours. CBG: Recent Labs  Lab 02/08/2020 1437  GLUCAP 124*   Lipid Profile: No results for input(s): CHOL, HDL, LDLCALC, TRIG, CHOLHDL, LDLDIRECT in the last 72 hours. Thyroid Function Tests: No results for input(s): TSH, T4TOTAL, FREET4, T3FREE, THYROIDAB in the last 72 hours. Anemia Panel: No results for input(s): VITAMINB12, FOLATE, FERRITIN, TIBC, IRON, RETICCTPCT in the last 72 hours. Urine analysis:    Component Value Date/Time   COLORURINE YELLOW  02/02/2020 1815   APPEARANCEUR HAZY (A) 01/18/2020 1815   LABSPEC 1.009 02/04/2020 1815   PHURINE 6.0 02/06/2020 1815   GLUCOSEU >=500 (A) 01/28/2020 1815   HGBUR NEGATIVE 01/26/2020 1815   BILIRUBINUR NEGATIVE 02/11/2020 1815   KETONESUR NEGATIVE 01/29/2020 1815   PROTEINUR >=300 (A) 02/10/2020 1815   UROBILINOGEN 0.2 09/23/2013 1926   NITRITE NEGATIVE 02/07/2020 1815   LEUKOCYTESUR NEGATIVE 02/04/2020 1815     Imaging Results:      Radiological Exams on Admission: DG Chest Portable 1 View  Result Date: 01/22/2020 CLINICAL DATA:  Wheezing EXAM: PORTABLE CHEST 1 VIEW COMPARISON:  08/29/2019 FINDINGS: Stable mild cardiomegaly status post TAVR. Calcific aortic knob. Bilateral interstitial opacities, right worse than left with areas of subtle nodularity most prominent in the right upper lobe. No pleural effusion or pneumothorax. IMPRESSION: Bilateral interstitial opacities, right worse than left with areas of subtle nodularity most prominent in the right upper lobe. Findings could reflect atypical or viral pneumonia. Radiographic follow-up to resolution is recommended. Electronically Signed   By: Davina Poke D.O.   On: 02/02/2020 14:46      Nichlas Pitera Ginette Otto MD [facility]  If 7PM-7AM, please contact night-coverage   01/16/2020, 8:42 PM

## 2020-02-10 ENCOUNTER — Inpatient Hospital Stay (HOSPITAL_COMMUNITY): Payer: Medicare HMO

## 2020-02-10 DIAGNOSIS — N183 Chronic kidney disease, stage 3 unspecified: Secondary | ICD-10-CM

## 2020-02-10 DIAGNOSIS — Z952 Presence of prosthetic heart valve: Secondary | ICD-10-CM

## 2020-02-10 DIAGNOSIS — I5032 Chronic diastolic (congestive) heart failure: Secondary | ICD-10-CM

## 2020-02-10 DIAGNOSIS — R0602 Shortness of breath: Secondary | ICD-10-CM

## 2020-02-10 DIAGNOSIS — I10 Essential (primary) hypertension: Secondary | ICD-10-CM

## 2020-02-10 LAB — BASIC METABOLIC PANEL
Anion gap: 14 (ref 5–15)
BUN: 25 mg/dL — ABNORMAL HIGH (ref 8–23)
CO2: 24 mmol/L (ref 22–32)
Calcium: 8.5 mg/dL — ABNORMAL LOW (ref 8.9–10.3)
Chloride: 97 mmol/L — ABNORMAL LOW (ref 98–111)
Creatinine, Ser: 1.99 mg/dL — ABNORMAL HIGH (ref 0.44–1.00)
GFR calc Af Amer: 28 mL/min — ABNORMAL LOW (ref >60)
GFR calc non Af Amer: 24 mL/min — ABNORMAL LOW (ref >60)
Glucose, Bld: 458 mg/dL — ABNORMAL HIGH (ref 70–99)
Potassium: 3.8 mmol/L (ref 3.5–5.1)
Sodium: 135 mmol/L (ref 135–145)

## 2020-02-10 LAB — PROCALCITONIN: Procalcitonin: <0.1 ng/mL

## 2020-02-10 LAB — CBC
HCT: 35.1 % — ABNORMAL LOW (ref 36.0–46.0)
Hemoglobin: 11.3 g/dL — ABNORMAL LOW (ref 12.0–15.0)
MCH: 28.3 pg (ref 26.0–34.0)
MCHC: 32.2 g/dL (ref 30.0–36.0)
MCV: 87.8 fL (ref 80.0–100.0)
Platelets: 370 10*3/uL (ref 150–400)
RBC: 4 MIL/uL (ref 3.87–5.11)
RDW: 14.3 % (ref 11.5–15.5)
WBC: 11.2 10*3/uL — ABNORMAL HIGH (ref 4.0–10.5)
nRBC: 0 % (ref 0.0–0.2)

## 2020-02-10 LAB — ECHOCARDIOGRAM COMPLETE
Height: 57 in
Weight: 2405.66 oz

## 2020-02-10 LAB — TSH: TSH: 32.535 u[IU]/mL — ABNORMAL HIGH (ref 0.350–4.500)

## 2020-02-10 LAB — HEMOGLOBIN A1C
Hgb A1c MFr Bld: 6.6 % — ABNORMAL HIGH (ref 4.8–5.6)
Mean Plasma Glucose: 142.72 mg/dL

## 2020-02-10 LAB — C-REACTIVE PROTEIN: CRP: 0.6 mg/dL (ref <1.0)

## 2020-02-10 LAB — GLUCOSE, CAPILLARY
Glucose-Capillary: 401 mg/dL — ABNORMAL HIGH (ref 70–99)
Glucose-Capillary: 381 mg/dL — ABNORMAL HIGH (ref 70–99)
Glucose-Capillary: 274 mg/dL — ABNORMAL HIGH (ref 70–99)
Glucose-Capillary: 104 mg/dL — ABNORMAL HIGH (ref 70–99)
Glucose-Capillary: 238 mg/dL — ABNORMAL HIGH (ref 70–99)

## 2020-02-10 LAB — MAGNESIUM: Magnesium: 2.5 mg/dL — ABNORMAL HIGH (ref 1.7–2.4)

## 2020-02-10 LAB — SARS CORONAVIRUS 2 (TAT 6-24 HRS): SARS Coronavirus 2: NEGATIVE

## 2020-02-10 LAB — TROPONIN I (HIGH SENSITIVITY)
Troponin I (High Sensitivity): 10 ng/L (ref <18)
Troponin I (High Sensitivity): 8 ng/L (ref <18)

## 2020-02-10 LAB — BRAIN NATRIURETIC PEPTIDE: B Natriuretic Peptide: 264 pg/mL — ABNORMAL HIGH (ref 0.0–100.0)

## 2020-02-10 MED ORDER — METHYLPREDNISOLONE SODIUM SUCC 125 MG IJ SOLR
60.0000 mg | Freq: Four times a day (QID) | INTRAMUSCULAR | Status: DC
  Administered 2020-02-10 – 2020-02-15 (×20): 60 mg via INTRAVENOUS
  Filled 2020-02-10 (×21): qty 2

## 2020-02-10 MED ORDER — INSULIN GLARGINE 100 UNIT/ML ~~LOC~~ SOLN
10.0000 [IU] | Freq: Every day | SUBCUTANEOUS | Status: DC
  Administered 2020-02-11: 10 [IU] via SUBCUTANEOUS
  Filled 2020-02-10 (×5): qty 0.1

## 2020-02-10 MED ORDER — LEVOTHYROXINE SODIUM 125 MCG PO TABS
125.0000 ug | ORAL_TABLET | Freq: Every day | ORAL | Status: DC
  Administered 2020-02-11 – 2020-02-19 (×9): 125 ug via ORAL
  Filled 2020-02-10 (×9): qty 1

## 2020-02-10 MED ORDER — INSULIN ASPART 100 UNIT/ML ~~LOC~~ SOLN
0.0000 [IU] | Freq: Every day | SUBCUTANEOUS | Status: DC
  Administered 2020-02-10: 2 [IU] via SUBCUTANEOUS
  Administered 2020-02-11: 3 [IU] via SUBCUTANEOUS
  Administered 2020-02-12: 2 [IU] via SUBCUTANEOUS
  Administered 2020-02-13: 3 [IU] via SUBCUTANEOUS
  Administered 2020-02-14: 2 [IU] via SUBCUTANEOUS

## 2020-02-10 MED ORDER — INSULIN ASPART 100 UNIT/ML ~~LOC~~ SOLN
0.0000 [IU] | Freq: Three times a day (TID) | SUBCUTANEOUS | Status: DC
  Administered 2020-02-10: 11 [IU] via SUBCUTANEOUS
  Administered 2020-02-10 – 2020-02-11 (×3): 20 [IU] via SUBCUTANEOUS
  Administered 2020-02-11: 09:00:00 11 [IU] via SUBCUTANEOUS
  Administered 2020-02-12: 7 [IU] via SUBCUTANEOUS
  Administered 2020-02-12: 15 [IU] via SUBCUTANEOUS
  Administered 2020-02-12: 11 [IU] via SUBCUTANEOUS
  Administered 2020-02-13 (×3): 4 [IU] via SUBCUTANEOUS
  Administered 2020-02-14: 7 [IU] via SUBCUTANEOUS
  Administered 2020-02-14: 4 [IU] via SUBCUTANEOUS
  Administered 2020-02-14: 3 [IU] via SUBCUTANEOUS
  Administered 2020-02-15: 7 [IU] via SUBCUTANEOUS
  Administered 2020-02-15: 3 [IU] via SUBCUTANEOUS
  Administered 2020-02-15: 13:00:00 4 [IU] via SUBCUTANEOUS

## 2020-02-10 MED ORDER — IPRATROPIUM-ALBUTEROL 0.5-2.5 (3) MG/3ML IN SOLN
3.0000 mL | Freq: Four times a day (QID) | RESPIRATORY_TRACT | Status: DC
  Administered 2020-02-10 – 2020-02-12 (×10): 3 mL via RESPIRATORY_TRACT
  Filled 2020-02-10 (×10): qty 3

## 2020-02-10 MED ORDER — INSULIN ASPART 100 UNIT/ML ~~LOC~~ SOLN
25.0000 [IU] | Freq: Once | SUBCUTANEOUS | Status: AC
  Administered 2020-02-10: 25 [IU] via SUBCUTANEOUS

## 2020-02-10 MED ORDER — ORAL CARE MOUTH RINSE
15.0000 mL | Freq: Two times a day (BID) | OROMUCOSAL | Status: DC
  Administered 2020-02-10 – 2020-02-21 (×21): 15 mL via OROMUCOSAL

## 2020-02-10 MED ORDER — BUDESONIDE 0.5 MG/2ML IN SUSP
0.5000 mg | Freq: Two times a day (BID) | RESPIRATORY_TRACT | Status: DC
  Administered 2020-02-10 – 2020-02-23 (×26): 0.5 mg via RESPIRATORY_TRACT
  Filled 2020-02-10 (×27): qty 2

## 2020-02-10 MED ORDER — OCUVITE-LUTEIN PO CAPS
1.0000 | ORAL_CAPSULE | Freq: Every day | ORAL | Status: DC
  Administered 2020-02-10 – 2020-02-19 (×10): 1 via ORAL
  Filled 2020-02-10 (×10): qty 1

## 2020-02-10 MED ORDER — INSULIN ASPART 100 UNIT/ML ~~LOC~~ SOLN
5.0000 [IU] | Freq: Three times a day (TID) | SUBCUTANEOUS | Status: DC
  Administered 2020-02-10 – 2020-02-11 (×5): 5 [IU] via SUBCUTANEOUS

## 2020-02-10 NOTE — Plan of Care (Signed)
  Problem: Nutrition: Goal: Adequate nutrition will be maintained Outcome: Progressing   Problem: Elimination: Goal: Will not experience complications related to urinary retention Outcome: Progressing   Problem: Safety: Goal: Ability to remain free from injury will improve Outcome: Progressing   

## 2020-02-10 NOTE — Progress Notes (Signed)
  Echocardiogram 2D Echocardiogram has been performed.  Darlina Sicilian M 02/10/2020, 2:06 PM

## 2020-02-10 NOTE — Progress Notes (Signed)
PROGRESS NOTE  [name] [name] GBT:[id number] DOB: 06/08/1946 DOA: 02/08/2020 PCP: Redmond School, MD  Brief History:  74 year old female with a history of diastolic CHF, COPD, CKD stage III, diabetes mellitus type 2, hypertension, severe aortic stenosis status post TAVR 06/22/6268, embolic stroke presenting with 3-day history of generalized weakness, coughing, shortness of breath, and wheezing.  The patient also had some unsteady gait and fell.  She did not lose consciousness.  The patient has not been able to use her nebulizer machine at home secondary to malfunctioning.  She denies any fevers, chills, chest pain, vomiting, diarrhea, abdominal pain.  Notably, the patient had a hospitalization from 01/10/2019 to 01/16/2019 where she underwent TAVR.  She had postoperative acute on chronic renal failure.  Her postoperative course was also complicated by bilateral lower extremity weakness and myoclonus.  She was seen by neurology at that time, and it was felt to be secondary to gabapentin toxicity in the setting of her acute on chronic renal failure.  MRI of the brain did show cardioembolic stroke.  CTA of the head and neck was negative for LVO.  But it did show advanced intracranial atherosclerosis with moderate to severe right and moderate left intracranial ICA stenosis.  The patient was subsequently discharged to [facility].  In the emergency department, the patient was afebrile hemodynamically stable with oxygen saturation 95% room air.  WBC 11.7.  Serum creatinine was 2.01.  Chest x-ray showed bilateral interstitial opacities.  The patient was started on Solu-Medrol for COPD exacerbation.  Assessment/Plan: COPD exacerbation -Start Pulmicort -Continue IV Solu-Medrol -Continue duo nebs  RLL opacity -Continue empiric ceftriaxone and azithromycin -Procalcitonin <0.10 -CT chest -continue empiric ceftriaxone and azithro  Acute on chronic renal failure CKD stage IIIb -Baseline creatinine 1.5-1.6  -Suspect the patient may have a degree of progression of her underlying renal disease -hold diuretics today  Chronic diastolic CHF -Daily weights -appears clinically euvolemic -daily weights  Uncontrolled diabetes mellitus type 2 with hyperglycemia -Hemoglobin A1c -01/14/2019 hemoglobin A1c 9.5 -Start Lantus 10 units daily -NovoLog sliding scale -NovoLog 5 units with meals -Holding glipizide, Jardiance, Metformin  Hypertension -Continue amlodipine  Hypothyroidism -TSH 32.53 -Increase Synthroid  Hyperlipidemia -Continue statin  Frequent falls -PT evaluation -orthostatic vital signs neg  History of stroke -Continue aspirin  History of TAVR -Underwent TAVR 01/10/2019 -finished 6 months DAPT         Disposition Plan: Patient From: Home D/C Place: Home - 2-3  Days Barriers: Not Clinically Stable--remains on IV abx and IV steroids  Family Communication:   Daughter updated on 2/27  Consultants:  none  Code Status:  FULL   DVT Prophylaxis:  [place] Heparin   Procedures: As Listed in Progress Note Above  Antibiotics: Ceftriaxone 2/26>>> azithro 2/26>>>      Subjective: Pt remains sob with cough.  She denies f/c, cp, n/v/d, abd pain  Objective: Vitals:   01/30/2020 2243 02/10/20 0514 02/10/20 0750 02/10/20 0807  BP: (!) 182/80 (!) 143/78  (!) 164/70  Pulse: 97 91  88  Resp: 18 (!) 22    Temp: 97.7 F (36.5 C) 97.8 F (36.6 C)    TempSrc: Oral Oral    SpO2: 95% 96% 90% 92%  Weight:  68.2 kg    Height:        Intake/Output Summary (Last 24 hours) at 02/10/2020 0953 Last data filed at 02/10/2020 0900 Gross per 24 hour  Intake 590 ml  Output -  Net  590 ml   Weight change:  Exam:   General:  Pt is alert, follows commands appropriately, not in acute distress  HEENT: No icterus, No thrush, No neck mass, [place]/AT  Cardiovascular: RRR, S1/S2, no rubs, no gallops  Respiratory: bilateral rales.  Bilateral exp wheeze  Abdomen: Soft/+BS, non  tender, non distended, no guarding  Extremities: No edema, No lymphangitis, No petechiae, No rashes, no synovitis   Data Reviewed: I have personally reviewed following labs and imaging studies Basic Metabolic Panel: Recent Labs  Lab 02/05/2020 1443 02/10/20 0224  NA 138 135  K 3.2* 3.8  CL 99 97*  CO2 27 24  GLUCOSE 134* 458*  BUN 23 25*  CREATININE 2.01* 1.99*  CALCIUM 8.9 8.5*   Liver Function Tests: No results for input(s): AST, ALT, ALKPHOS, BILITOT, PROT, ALBUMIN in the last 168 hours. No results for input(s): LIPASE, AMYLASE in the last 168 hours. No results for input(s): AMMONIA in the last 168 hours. Coagulation Profile: No results for input(s): INR, PROTIME in the last 168 hours. CBC: Recent Labs  Lab 02/06/2020 1443 02/10/20 0224  WBC 11.7* 11.2*  NEUTROABS 8.4*  --   HGB 11.7* 11.3*  HCT 36.0 35.1*  MCV 88.2 87.8  PLT 366 370   Cardiac Enzymes: No results for input(s): CKTOTAL, CKMB, CKMBINDEX, TROPONINI in the last 168 hours. BNP: Invalid input(s): POCBNP CBG: Recent Labs  Lab 01/23/2020 1437 02/10/20 0720 02/10/20 0850  GLUCAP 124* 401* 381*   HbA1C: No results for input(s): HGBA1C in the last 72 hours. Urine analysis:    Component Value Date/Time   COLORURINE YELLOW 01/26/2020 1815   APPEARANCEUR HAZY (A) 01/20/2020 1815   LABSPEC 1.009 01/22/2020 1815   PHURINE 6.0 01/30/2020 1815   GLUCOSEU >=500 (A) 01/27/2020 1815   HGBUR NEGATIVE 02/03/2020 1815   BILIRUBINUR NEGATIVE 01/31/2020 1815   KETONESUR NEGATIVE 01/15/2020 1815   PROTEINUR >=300 (A) 02/08/2020 1815   UROBILINOGEN 0.2 09/23/2013 1926   NITRITE NEGATIVE 01/21/2020 1815   LEUKOCYTESUR NEGATIVE 01/17/2020 1815   Sepsis Labs: @LABRCNTIP (procalcitonin:4,lacticidven:4) )No results found for this or any previous visit (from the past 240 hour(s)).   Scheduled Meds: . amLODipine  10 mg Oral Daily  . aspirin EC  81 mg Oral Daily  . gabapentin  400 mg Oral TID  . heparin  5,000  Units Subcutaneous Q8H  . insulin aspart  0-20 Units Subcutaneous TID WC  . insulin aspart  0-5 Units Subcutaneous QHS  . insulin aspart  5 Units Subcutaneous TID WC  . levothyroxine  88 mcg Oral Daily  . mouth rinse  15 mL Mouth Rinse BID  . methylPREDNISolone (SOLU-MEDROL) injection  40 mg Intravenous Q12H  . multivitamin-lutein  1 capsule Oral Daily  . potassium chloride SA  20 mEq Oral Daily  . simvastatin  20 mg Oral Daily  . sodium chloride flush  3 mL Intravenous Q12H  . timolol  1 drop Both Eyes BID  . vitamin B-12  1,000 mcg Oral Daily   Continuous Infusions: . sodium chloride    . azithromycin    . cefTRIAXone (ROCEPHIN)  IV      Procedures/Studies: DG Chest Portable 1 View  Result Date: 01/21/2020 CLINICAL DATA:  Wheezing EXAM: PORTABLE CHEST 1 VIEW COMPARISON:  08/29/2019 FINDINGS: Stable mild cardiomegaly status post TAVR. Calcific aortic knob. Bilateral interstitial opacities, right worse than left with areas of subtle nodularity most prominent in the right upper lobe. No pleural effusion or pneumothorax. IMPRESSION: Bilateral interstitial opacities,  right worse than left with areas of subtle nodularity most prominent in the right upper lobe. Findings could reflect atypical or viral pneumonia. Radiographic follow-up to resolution is recommended. Electronically Signed   By: Davina Poke D.O.   On: 01/19/2020 14:46    Orson Eva, DO  [facility]  If 7PM-7AM, please contact night-coverage [email or web address] Password [facility] 02/10/2020, 9:53 AM   LOS: 1 day

## 2020-02-10 NOTE — Progress Notes (Signed)
Dr. Carles Collet notified of elevated morning blood sugar of 401. Awaiting further orders.

## 2020-02-10 NOTE — Plan of Care (Signed)
  Problem: Education: Goal: Knowledge of General Education information will improve Description: Including pain rating scale, medication(s)/side effects and non-pharmacologic comfort measures Outcome: Progressing   Problem: Clinical Measurements: Goal: Will remain free from infection Outcome: Progressing   Problem: Clinical Measurements: Goal: Diagnostic test results will improve Outcome: Progressing   Problem: Coping: Goal: Level of anxiety will decrease Outcome: Progressing   Problem: Safety: Goal: Ability to remain free from injury will improve Outcome: Progressing

## 2020-02-11 DIAGNOSIS — F4542 Pain disorder with related psychological factors: Secondary | ICD-10-CM | POA: Diagnosis not present

## 2020-02-11 DIAGNOSIS — I110 Hypertensive heart disease with heart failure: Secondary | ICD-10-CM | POA: Diagnosis not present

## 2020-02-11 DIAGNOSIS — G894 Chronic pain syndrome: Secondary | ICD-10-CM | POA: Diagnosis not present

## 2020-02-11 DIAGNOSIS — N1832 Chronic kidney disease, stage 3b: Secondary | ICD-10-CM

## 2020-02-11 DIAGNOSIS — I5032 Chronic diastolic (congestive) heart failure: Secondary | ICD-10-CM | POA: Diagnosis not present

## 2020-02-11 LAB — BASIC METABOLIC PANEL
Anion gap: 10 (ref 5–15)
BUN: 43 mg/dL — ABNORMAL HIGH (ref 8–23)
CO2: 26 mmol/L (ref 22–32)
Calcium: 8.5 mg/dL — ABNORMAL LOW (ref 8.9–10.3)
Chloride: 96 mmol/L — ABNORMAL LOW (ref 98–111)
Creatinine, Ser: 2.34 mg/dL — ABNORMAL HIGH (ref 0.44–1.00)
GFR calc Af Amer: 23 mL/min — ABNORMAL LOW (ref >60)
GFR calc non Af Amer: 20 mL/min — ABNORMAL LOW (ref >60)
Glucose, Bld: 292 mg/dL — ABNORMAL HIGH (ref 70–99)
Potassium: 4.8 mmol/L (ref 3.5–5.1)
Sodium: 132 mmol/L — ABNORMAL LOW (ref 135–145)

## 2020-02-11 LAB — CBC
HCT: 33.9 % — ABNORMAL LOW (ref 36.0–46.0)
Hemoglobin: 10.8 g/dL — ABNORMAL LOW (ref 12.0–15.0)
MCH: 28.4 pg (ref 26.0–34.0)
MCHC: 31.9 g/dL (ref 30.0–36.0)
MCV: 89.2 fL (ref 80.0–100.0)
Platelets: 378 10*3/uL (ref 150–400)
RBC: 3.8 MIL/uL — ABNORMAL LOW (ref 3.87–5.11)
RDW: 14.6 % (ref 11.5–15.5)
WBC: 16.6 10*3/uL — ABNORMAL HIGH (ref 4.0–10.5)
nRBC: 0 % (ref 0.0–0.2)

## 2020-02-11 LAB — GLUCOSE, CAPILLARY
Glucose-Capillary: 290 mg/dL — ABNORMAL HIGH (ref 70–99)
Glucose-Capillary: 395 mg/dL — ABNORMAL HIGH (ref 70–99)
Glucose-Capillary: 365 mg/dL — ABNORMAL HIGH (ref 70–99)
Glucose-Capillary: 295 mg/dL — ABNORMAL HIGH (ref 70–99)

## 2020-02-11 LAB — MAGNESIUM: Magnesium: 2.6 mg/dL — ABNORMAL HIGH (ref 1.7–2.4)

## 2020-02-11 MED ORDER — SODIUM CHLORIDE 0.9 % IV SOLN: INTRAVENOUS | Status: AC

## 2020-02-11 MED ORDER — INSULIN GLARGINE 100 UNIT/ML ~~LOC~~ SOLN
20.0000 [IU] | Freq: Every day | SUBCUTANEOUS | Status: DC
  Administered 2020-02-12: 10:00:00 20 [IU] via SUBCUTANEOUS
  Filled 2020-02-11 (×2): qty 0.2

## 2020-02-11 MED ORDER — INSULIN GLARGINE 100 UNIT/ML ~~LOC~~ SOLN
10.0000 [IU] | Freq: Once | SUBCUTANEOUS | Status: AC
  Administered 2020-02-11: 10 [IU] via SUBCUTANEOUS
  Filled 2020-02-11: qty 0.1

## 2020-02-11 MED ORDER — INSULIN ASPART 100 UNIT/ML ~~LOC~~ SOLN
10.0000 [IU] | Freq: Three times a day (TID) | SUBCUTANEOUS | Status: DC
  Administered 2020-02-12 (×2): 10 [IU] via SUBCUTANEOUS

## 2020-02-11 NOTE — Progress Notes (Signed)
PROGRESS NOTE  [name] [name]:[id number] DOB: 1946-10-28 DOA: 02/04/2020 PCP: Redmond School, MD   Brief History:  74 year old female with a history of diastolic CHF, COPD, CKD stage III, diabetes mellitus type 2, hypertension, severe aortic stenosis status post TAVR 5/85/2778, embolic stroke presenting with 3-day history of generalized weakness, coughing, shortness of breath, and wheezing.  The patient also had some unsteady gait and fell.  She did not lose consciousness.  The patient has not been able to use her nebulizer machine at home secondary to malfunctioning.  She denies any fevers, chills, chest pain, vomiting, diarrhea, abdominal pain.  Notably, the patient had a hospitalization from 01/10/2019 to 01/16/2019 where she underwent TAVR.  She had postoperative acute on chronic renal failure.  Her postoperative course was also complicated by bilateral lower extremity weakness and myoclonus.  She was seen by neurology at that time, and it was felt to be secondary to gabapentin toxicity in the setting of her acute on chronic renal failure.  MRI of the brain did show cardioembolic stroke.  CTA of the head and neck was negative for LVO.  But it did show advanced intracranial atherosclerosis with moderate to severe right and moderate left intracranial ICA stenosis.  The patient was subsequently discharged to CIR.  In the emergency department, the patient was afebrile hemodynamically stable with oxygen saturation 95% room air.  WBC 11.7.  Serum creatinine was 2.01.  Chest x-ray showed bilateral interstitial opacities.  The patient was started on Solu-Medrol for COPD exacerbation.  Assessment/Plan: COPD exacerbation -Continue Pulmicort -Continue IV Solu-Medrol -Continue duo nebs  R-lung opacity/CAP -Continue empiric ceftriaxone and azithromycin -Procalcitonin <0.10 -CT chest--Patchy opacities at the right lung apex with surrounding ground-glass and peripheral predominance;  bronchomalacia; circumferential esophageal thickening  Acute on chronic renal failure CKD stage IIIb -Baseline creatinine 1.5-1.6 -Suspect the patient may have a degree of progression of her underlying renal disease -hold diuretics today -judicious IVF -renal US  Chronic diastolic CHF -Daily weights -appears clinically euvolemic -daily weights -2/27 echo--EF 55-60%, no WMA, G1DD, mild MR, s/pTAVR  Uncontrolled diabetes mellitus type 2 with hyperglycemia -2/27/21Hemoglobin A1c--6.6 -increase Lantus 20 units daily -NovoLog sliding scale -Increas NovoLog 10 units with meals -Holding glipizide, Jardiance, Metformin -elevated cbg due to steroids  Hypertension -Continue amlodipine  Hypothyroidism -TSH 32.53 -Increase Synthroid  Hyperlipidemia -Continue statin  Frequent falls -PT evaluation -orthostatic vital signs neg  History of stroke -Continue aspirin  History of TAVR -Underwent TAVR 01/10/2019 -finished 6 months DAPT         Disposition Plan: Patient From: Home D/C Place: Home - 1-2  Days Barriers: Not Clinically Stable--remains on IV abx and IV steroids  Family Communication:   Daughter updated on 2/28  Consultants:  none  Code Status:  FULL   DVT Prophylaxis:  [place] Heparin   Procedures: As Listed in Progress Note Above  Antibiotics: Ceftriaxone 2/26>>> azithro 2/26>>>    Subjective: Pt is breathing better, but still wheezing.  Still having sob with exertion.  Denies cp, n/v/d, abd pain.  Objective: Vitals:   02/11/20 0532 02/11/20 0733 02/11/20 1304 02/11/20 1339  BP: 133/81   128/62  Pulse: 70   74  Resp: 20   18  Temp: (!) 97.4 F (36.3 C)   98 F (36.7 C)  TempSrc: Oral     SpO2: 98% 94% 91%   Weight:      Height:  Intake/Output Summary (Last 24 hours) at 02/11/2020 1722 Last data filed at 02/11/2020 1500 Gross per 24 hour  Intake 590 ml  Output 250 ml  Net 340 ml   Weight change: 3  kg Exam:   General:  Pt is alert, follows commands appropriately, not in acute distress  HEENT: No icterus, No thrush, No neck mass, [place]/AT  Cardiovascular: RRR, S1/S2, no rubs, no gallops  Respiratory: bilateral exp wheeze.  Bilateral rales  Abdomen: Soft/+BS, non tender, non distended, no guarding  Extremities: trace LE edema, No lymphangitis, No petechiae, No rashes, no synovitis   Data Reviewed: I have personally reviewed following labs and imaging studies Basic Metabolic Panel: Recent Labs  Lab 02/04/2020 1443 02/10/20 0224 02/10/20 0918 02/11/20 0540  NA 138 135  --  132*  K 3.2* 3.8  --  4.8  CL 99 97*  --  96*  CO2 27 24  --  26  GLUCOSE 134* 458*  --  292*  BUN 23 25*  --  43*  CREATININE 2.01* 1.99*  --  2.34*  CALCIUM 8.9 8.5*  --  8.5*  MG  --   --  2.5* 2.6*   Liver Function Tests: No results for input(s): AST, ALT, ALKPHOS, BILITOT, PROT, ALBUMIN in the last 168 hours. No results for input(s): LIPASE, AMYLASE in the last 168 hours. No results for input(s): AMMONIA in the last 168 hours. Coagulation Profile: No results for input(s): INR, PROTIME in the last 168 hours. CBC: Recent Labs  Lab 01/26/2020 1443 02/10/20 0224 02/11/20 0540  WBC 11.7* 11.2* 16.6*  NEUTROABS 8.4*  --   --   HGB 11.7* 11.3* 10.8*  HCT 36.0 35.1* 33.9*  MCV 88.2 87.8 89.2  PLT 366 370 378   Cardiac Enzymes: No results for input(s): CKTOTAL, CKMB, CKMBINDEX, TROPONINI in the last 168 hours. BNP: Invalid input(s): POCBNP CBG: Recent Labs  Lab 02/10/20 1621 02/10/20 2123 02/11/20 0710 02/11/20 1116 02/11/20 1608  GLUCAP 104* 238* 290* 395* 365*   HbA1C: Recent Labs    02/10/20 0224  HGBA1C 6.6*   Urine analysis:    Component Value Date/Time   COLORURINE YELLOW 01/16/2020 1815   APPEARANCEUR HAZY (A) 02/06/2020 1815   LABSPEC 1.009 01/18/2020 1815   PHURINE 6.0 01/17/2020 1815   GLUCOSEU >=500 (A) 01/24/2020 1815   HGBUR NEGATIVE 02/08/2020 1815    BILIRUBINUR NEGATIVE 01/19/2020 1815   KETONESUR NEGATIVE 02/02/2020 1815   PROTEINUR >=300 (A) 01/26/2020 1815   UROBILINOGEN 0.2 09/23/2013 1926   NITRITE NEGATIVE 02/08/2020 1815   LEUKOCYTESUR NEGATIVE 02/06/2020 1815   Sepsis Labs: @LABRCNTIP (procalcitonin:4,lacticidven:4) ) Recent Results (from the past 240 hour(s))  SARS CORONAVIRUS 2 (Saralynn Langhorst 6-24 HRS) Nasopharyngeal Nasopharyngeal Swab     Status: None   Collection Time: 02/08/2020  7:58 PM   Specimen: Nasopharyngeal Swab  Result Value Ref Range Status   SARS Coronavirus 2 NEGATIVE NEGATIVE Final    Comment: (NOTE) SARS-CoV-2 target nucleic acids are NOT DETECTED. The SARS-CoV-2 RNA is generally detectable in upper and lower respiratory specimens during the acute phase of infection. Negative results do not preclude SARS-CoV-2 infection, do not rule out co-infections with other pathogens, and should not be used as the sole basis for treatment or other patient management decisions. Negative results must be combined with clinical observations, patient history, and epidemiological information. The expected result is Negative. Fact Sheet for Patients: SugarRoll.be Fact Sheet for Healthcare Providers: https://www.woods-mathews.com/ This test is not yet approved or cleared by the [place]  States FDA and  has been authorized for detection and/or diagnosis of SARS-CoV-2 by FDA under an Emergency Use Authorization (EUA). This EUA will remain  in effect (meaning this test can be used) for the duration of the COVID-19 declaration under Section 56 4(b)(1) of the Act, 21 U.S.C. section 360bbb-3(b)(1), unless the authorization is terminated or revoked sooner. Performed at [facility], [place] [address] [place], [place] [address]      Scheduled Meds: . amLODipine  10 mg Oral Daily  . aspirin EC  81 mg Oral Daily  . budesonide (PULMICORT) nebulizer solution  0.5 mg Nebulization BID  .  gabapentin  400 mg Oral TID  . heparin  5,000 Units Subcutaneous Q8H  . insulin aspart  0-20 Units Subcutaneous TID WC  . insulin aspart  0-5 Units Subcutaneous QHS  . insulin aspart  5 Units Subcutaneous TID WC  . insulin glargine  10 Units Subcutaneous Daily  . ipratropium-albuterol  3 mL Nebulization Q6H  . levothyroxine  125 mcg Oral Daily  . mouth rinse  15 mL Mouth Rinse BID  . methylPREDNISolone (SOLU-MEDROL) injection  60 mg Intravenous Q6H  . multivitamin-lutein  1 capsule Oral Daily  . potassium chloride SA  20 mEq Oral Daily  . simvastatin  20 mg Oral Daily  . sodium chloride flush  3 mL Intravenous Q12H  . timolol  1 drop Both Eyes BID  . vitamin B-12  1,000 mcg Oral Daily   Continuous Infusions: . sodium chloride    . azithromycin 500 mg (02/10/20 1858)  . cefTRIAXone (ROCEPHIN)  IV 1 g (02/11/20 1654)    Procedures/Studies: CT CHEST WO CONTRAST  Result Date: 02/10/2020 CLINICAL DATA:  Cough, persistent COPD exacerbation. Chest x-ray with questionable abnormalities. EXAM: CT CHEST WITHOUT CONTRAST TECHNIQUE: Multidetector CT imaging of the chest was performed following the standard protocol without IV contrast. COMPARISON:  Chest x-ray of 02/03/2020 and CT a chest abdomen and pelvis from 08/18/2018 FINDINGS: Cardiovascular: Calcified atherosclerotic changes and three-vessel coronary artery disease with evidence of prior trans arterial aortic valve replacement. Heart size mildly enlarged without pericardial effusion. Limited assessment of vascular structures due to lack of intravenous contrast. Central pulmonary vasculature mildly dilated at 2.7 cm. Mediastinum/Nodes: Mildly enlarged AP window lymph node 11 mm not changed since 2019. Scattered small lymph nodes throughout the chest none with pathologic enlargement. Hilar structures not well evaluated. No thoracic inlet adenopathy. No thoracic inlet adenopathy. Esophagus with mild circumferential thickening distally.  Lungs/Pleura: Patchy opacities at the right lung apex with surrounding ground-glass and peripheral predominance. Some of these areas show small central solid-appearing nodules, largest measuring 4 mm (image 55, series 5) process appears confined to the right upper lobe. Airways are patent. Some distortion of the right hilum and mild narrowing of the bronchus intermedius is unchanged and may reflect mild bronchomalacia. Scattered areas of scarring in the lingula are stable. Upper Abdomen: Incidental imaging of upper abdominal contents showing no signs of acute abnormality. Musculoskeletal: No acute bone finding or destructive bone process. IMPRESSION: 1. Patchy opacities at the right lung apex with surrounding ground-glass and peripheral predominance. Process appears confined to mainly to the right upper lobe. This would be atypical for metastases. Infection with viral or atypical process is considered. Correlation with blood cultures may be helpful to exclude hematogenous process in this patient with aortic valve replacement. 2. Stable mildly enlarged AP window lymph node. Attention on follow-up 3. Mild circumferential thickening of the distal esophagus may reflect esophagitis.  4. Calcified atherosclerotic changes and three-vessel coronary artery disease with evidence of prior trans arterial aortic valve replacement. Aortic Atherosclerosis (ICD10-I70.0). Electronically Signed   By: Zetta Bills M.D.   On: 02/10/2020 17:58   DG Chest Portable 1 View  Result Date: 01/18/2020 CLINICAL DATA:  Wheezing EXAM: PORTABLE CHEST 1 VIEW COMPARISON:  08/29/2019 FINDINGS: Stable mild cardiomegaly status post TAVR. Calcific aortic knob. Bilateral interstitial opacities, right worse than left with areas of subtle nodularity most prominent in the right upper lobe. No pleural effusion or pneumothorax. IMPRESSION: Bilateral interstitial opacities, right worse than left with areas of subtle nodularity most prominent in the right  upper lobe. Findings could reflect atypical or viral pneumonia. Radiographic follow-up to resolution is recommended. Electronically Signed   By: Davina Poke D.O.   On: 02/11/2020 14:46   ECHOCARDIOGRAM COMPLETE  Result Date: 02/10/2020    ECHOCARDIOGRAM REPORT   Patient Name:   SHAQUANA BUEL Date of Exam: 02/10/2020 Medical Rec #:  [id number]         Height:       57.0 in Accession #:    [id number]        Weight:       150.4 lb Date of Birth:  [birth year]         BSA:          1.593 m Patient Age:    32 years          BP:           128/61 mmHg Patient Gender: F                 HR:           75 bpm. Exam Location:  Inpatient Procedure: 2D Echo and Strain Analysis Indications:    Aortic Stenosis 424.1 / 135.0  History:        Patient has prior history of Echocardiogram examinations, most                 recent 01/11/2019. Stroke, Signs/Symptoms:Shortness of Breath;                 Risk Factors:Hypertension, Diabetes and Dyslipidemia. Pneumonia.                 Chronic kidney disease. Bilateral carotid stenosis.                 Aortic Valve: 23 mm Edwards Sapien prosthetic, stented (TAVR)                 valve is present in the aortic position. Procedure Date:                 01/10/2019.  Sonographer:    Darlina Sicilian RDCS Referring Phys: [id number] [place]  1. Left ventricular ejection fraction, by estimation, is 55 to 60%. The left ventricle has normal function. The left ventricle has no regional wall motion abnormalities. There is moderate concentric left ventricular hypertrophy. Left ventricular diastolic parameters are consistent with Grade I diastolic dysfunction (impaired relaxation). Elevated left ventricular end-diastolic pressure.  2. Right ventricular systolic function is normal. The right ventricular size is normal.  3. The mitral valve is normal in structure and function. Mild mitral annular calcification. Mild mitral valve regurgitation. No evidence of mitral stenosis.  4. The  aortic valve has been repaired/replaced. There is trivial perivalvular AI ipresent. No aortic stenosis is present. There is a 23 mm Edwards Sapien prosthetic (TAVR) valve  present in the aortic position. that is funcitoning normaly. Aortic valve mean gradient measures 12.0 mmHg. Aortic valve peak gradient measures 21.8 mmHg. Aortic valve area, by VTI measures 1.66 cm. Dimensionless index is 0.44 and the AVA is calculated to be 1.51cm2.  5. The inferior vena cava is normal in size with greater than 50% respiratory variability, suggesting right atrial pressure of 3 mmHg.  6. Compared to prior echo, the mean TAVR gradient has increased from 11 to 20mmhg and AVG is 1.51cm2 (was 1.88cm2 on last echo). FINDINGS  Left Ventricle: Left ventricular ejection fraction, by estimation, is 55 to 60%. The left ventricle has normal function. The left ventricle has no regional wall motion abnormalities. The left ventricular internal cavity size was normal in size. There is  moderate concentric left ventricular hypertrophy. Left ventricular diastolic parameters are consistent with Grade I diastolic dysfunction (impaired relaxation). Elevated left ventricular end-diastolic pressure. Right Ventricle: The right ventricular size is normal. No increase in right ventricular wall thickness. Right ventricular systolic function is normal. Left Atrium: Left atrial size was normal in size. Right Atrium: Right atrial size was normal in size. Pericardium: Trivial pericardial effusion is present. The pericardial effusion is posterior to the left ventricle. Mitral Valve: The mitral valve is normal in structure and function. Normal mobility of the mitral valve leaflets. Mild mitral annular calcification. Mild mitral valve regurgitation. No evidence of mitral valve stenosis. Tricuspid Valve: The tricuspid valve is normal in structure. Tricuspid valve regurgitation is not demonstrated. No evidence of tricuspid stenosis. Aortic Valve: The aortic valve  has been repaired/replaced. Aortic valve regurgitation trivial perivalvular AI is present. No aortic stenosis is present. Aortic valve mean gradient measures 12.0 mmHg. Aortic valve peak gradient measures 21.8 mmHg. Aortic valve area, by VTI measures 1.66 cm. There is a 23 mm Edwards Sapien prosthetic, stented (TAVR) valve present in the aortic position. Procedure Date: 01/10/2019. Pulmonic Valve: The pulmonic valve was normal in structure. Pulmonic valve regurgitation is not visualized. No evidence of pulmonic stenosis. Aorta: The aortic root is normal in size and structure. Venous: The inferior vena cava is normal in size with greater than 50% respiratory variability, suggesting right atrial pressure of 3 mmHg. IAS/Shunts: No atrial level shunt detected by color flow Doppler.  LEFT VENTRICLE PLAX 2D LVIDd:         4.79 cm      Diastology LVIDs:         3.43 cm      LV e' lateral:   4.35 cm/s LV PW:         1.22 cm      LV E/e' lateral: 29.7 LV IVS:        1.34 cm      LV e' medial:    3.00 cm/s LVOT diam:     2.20 cm      LV E/e' medial:  43.0 LV SV:         82 LV SV Index:   52 LVOT Area:     3.80 cm  LV Volumes (MOD) LV vol d, MOD A2C: 99.5 ml LV vol d, MOD A4C: 125.0 ml LV vol s, MOD A2C: 50.2 ml LV vol s, MOD A4C: 56.4 ml LV SV MOD A2C:     49.3 ml LV SV MOD A4C:     125.0 ml LV SV MOD BP:      58.3 ml RIGHT VENTRICLE RV S prime:     11.20 cm/s TAPSE (M-mode): 1.6 cm LEFT ATRIUM  Index       RIGHT ATRIUM           Index LA diam:        3.60 cm 2.26 cm/m  RA Area:     15.20 cm LA Vol (A2C):   55.7 ml 34.96 ml/m RA Volume:   34.70 ml  21.78 ml/m LA Vol (A4C):   49.6 ml 31.13 ml/m LA Biplane Vol: 53.2 ml 33.39 ml/m  AORTIC VALVE AV Area (Vmax):    1.51 cm AV Area (Vmean):   1.61 cm AV Area (VTI):     1.66 cm AV Vmax:           233.50 cm/s AV Vmean:          162.000 cm/s AV VTI:            0.496 m AV Peak Grad:      21.8 mmHg AV Mean Grad:      12.0 mmHg LVOT Vmax:         92.50 cm/s LVOT  Vmean:        68.400 cm/s LVOT VTI:          0.217 m LVOT/AV VTI ratio: 0.44  AORTA Ao Root diam: 3.10 cm Ao Asc diam:  3.00 cm MITRAL VALVE MV Area (PHT): 3.21 cm     SHUNTS MV Decel Time: 236 msec     Systemic VTI:  0.22 m MV E velocity: 129.00 cm/s  Systemic Diam: 2.20 cm MV A velocity: 144.00 cm/s MV E/A ratio:  0.90 Fransico Him MD Electronically signed by Fransico Him MD Signature Date/Time: 02/10/2020/5:45:00 PM    Final     Orson Eva, DO  [facility]  If 7PM-7AM, please contact night-coverage [email or web address] Password [email or web address] 02/11/2020, 5:22 PM   LOS: 2 days

## 2020-02-12 ENCOUNTER — Inpatient Hospital Stay (HOSPITAL_COMMUNITY): Payer: Medicare HMO

## 2020-02-12 LAB — BASIC METABOLIC PANEL
Anion gap: 12 (ref 5–15)
BUN: 59 mg/dL — ABNORMAL HIGH (ref 8–23)
CO2: 22 mmol/L (ref 22–32)
Calcium: 7.7 mg/dL — ABNORMAL LOW (ref 8.9–10.3)
Chloride: 93 mmol/L — ABNORMAL LOW (ref 98–111)
Creatinine, Ser: 2.75 mg/dL — ABNORMAL HIGH (ref 0.44–1.00)
GFR calc Af Amer: 19 mL/min — ABNORMAL LOW (ref >60)
GFR calc non Af Amer: 16 mL/min — ABNORMAL LOW (ref >60)
Glucose, Bld: 260 mg/dL — ABNORMAL HIGH (ref 70–99)
Potassium: 5.2 mmol/L — ABNORMAL HIGH (ref 3.5–5.1)
Sodium: 127 mmol/L — ABNORMAL LOW (ref 135–145)

## 2020-02-12 LAB — URINALYSIS, ROUTINE W REFLEX MICROSCOPIC
Bacteria, UA: NONE SEEN
Bilirubin Urine: NEGATIVE
Glucose, UA: >=500 mg/dL — AB
Hgb urine dipstick: NEGATIVE
Ketones, ur: NEGATIVE mg/dL
Leukocytes,Ua: NEGATIVE
Nitrite: NEGATIVE
Protein, ur: >=300 mg/dL — AB
Specific Gravity, Urine: 1.011 (ref 1.005–1.030)
pH: 5 (ref 5.0–8.0)

## 2020-02-12 LAB — GLUCOSE, CAPILLARY
Glucose-Capillary: 322 mg/dL — ABNORMAL HIGH (ref 70–99)
Glucose-Capillary: 253 mg/dL — ABNORMAL HIGH (ref 70–99)
Glucose-Capillary: 206 mg/dL — ABNORMAL HIGH (ref 70–99)
Glucose-Capillary: 224 mg/dL — ABNORMAL HIGH (ref 70–99)

## 2020-02-12 LAB — RESPIRATORY PANEL BY PCR

## 2020-02-12 LAB — PROTEIN / CREATININE RATIO, URINE
Creatinine, Urine: 79.17 mg/dL
Protein Creatinine Ratio: 3.47 mg/mg{Cre} — ABNORMAL HIGH (ref 0.00–0.15)
Total Protein, Urine: 275 mg/dL

## 2020-02-12 LAB — CK: Total CK: 31 U/L — ABNORMAL LOW (ref 38–234)

## 2020-02-12 MED ORDER — INSULIN ASPART 100 UNIT/ML ~~LOC~~ SOLN
14.0000 [IU] | Freq: Three times a day (TID) | SUBCUTANEOUS | Status: DC
  Administered 2020-02-12 – 2020-02-15 (×9): 14 [IU] via SUBCUTANEOUS

## 2020-02-12 MED ORDER — IPRATROPIUM-ALBUTEROL 0.5-2.5 (3) MG/3ML IN SOLN
3.0000 mL | Freq: Four times a day (QID) | RESPIRATORY_TRACT | Status: DC
  Administered 2020-02-13 – 2020-02-15 (×10): 3 mL via RESPIRATORY_TRACT
  Filled 2020-02-12 (×11): qty 3

## 2020-02-12 MED ORDER — INSULIN GLARGINE 100 UNIT/ML ~~LOC~~ SOLN
25.0000 [IU] | Freq: Every day | SUBCUTANEOUS | Status: DC
  Administered 2020-02-13: 25 [IU] via SUBCUTANEOUS
  Filled 2020-02-12 (×3): qty 0.25

## 2020-02-12 NOTE — Consult Note (Signed)
[name] [name] Admit Date: 02/03/2020 02/12/2020 [name] Requesting Physician:  Tat MD  Reason for Consult:  AoCKD3 HPI:  74F admitted on 2/26 after presenting with weakness, cough, shortness of breath and diagnosed with COPD exacerbation, started on corticosteroids, ceftriaxone, azithromycin.  Past history also includes CHF, CKD 3 followed by Dr. Theador Hawthorne, DM 2 for decades, hypertension, severe aortic stenosis status post TAVR approximately 1 year ago which was complicated by AKI, history of CVA.  Patient's baseline serum creatinine appears to be around 1.7-2.0.  She was at or near her baseline at the time of presentation but creatinine has worsened to 2.75 today.  No exposure to IV contrast, nonsteroidals, obvious nephrotoxins.  She has been nonoliguric.  She denies using NSAIDs prior to admission.  Home medications do not include RAAS inhibitor.  Other labs reveal potassium 5.2, HCO3 22.  No significant edema.  Renal ultrasound is pending.  No UA.  She currently is receiving normal saline at 75 mL/h.  She feels like her breathing has improved but not yet resolved to baseline.  She continues to have significant expiratory wheezing.   Creatinine, Ser (mg/dL)  Date Value  02/12/2020 2.75 (H)  02/11/2020 2.34 (H)  02/10/2020 1.99 (H)  01/26/2020 2.01 (H)  01/25/2019 1.55 (H)  01/24/2019 1.66 (H)  01/23/2019 1.65 (H)  01/21/2019 1.52 (H)  01/20/2019 1.74 (H)  01/19/2019 2.17 (H)  ] I/Os: I/O last 3 completed shifts: In: 2323.8 [P.O.:720; I.V.:900; IV Piggyback:703.8] Out: 1350 [Urine:1350]   ROS NSAIDS: Denies use IV Contrast no exposure TMP/SMX no exposure Hypotension no identified occurrence Balance of 12 systems is negative w/ exceptions as above  PMH  Past Medical History:  Diagnosis Date  . Anemia   . Anxiety   . Arthritis   . Blood transfusion without reported diagnosis   . Bronchitis   . CHF (congestive heart failure) ([place])   . CKD (chronic kidney  disease) stage 3, GFR 30-59 ml/min 08/18/2018  . Depression   . Diabetes mellitus   . Diverticulitis   . Glaucoma   . Hyperlipidemia   . Hypertension   . Hypothyroidism   . Macular degeneration   . Melanoma ([place])    left thigh, 2010  . Osteoarthritis   . Peptic ulcer   . Rheumatoid arthritis(714.0)   . 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 Severe aortic stenosis    PSH  Past Surgical History:  Procedure Laterality Date  . ABDOMINAL HYSTERECTOMY  1988  . APPENDECTOMY    . BACK SURGERY  07/2008   fusion  . BACK SURGERY  03/101   complicated by post-op anemia requiring transfusion  . BREAST SURGERY     right breast biopsy  . CARDIAC CATHETERIZATION    . CATARACT EXTRACTION    . CHOLECYSTECTOMY  1974  . COLONOSCOPY  06/06/2007   Proximal diminutive rectal polyps cold biopsied/removed, otherwise normal rectum/ Left-sided diverticula, the remainder of the colonic mucosa appeared normal. Path-tubular adnemoa.   . COLONOSCOPY  09/14/2012   VOZ:DGUYQI polyps-removed as described  above Colonic diverticulosis. Poor prep. next TCS five years.   . COLONOSCOPY N/A 10/29/2016   Procedure: COLONOSCOPY;  Surgeon: Daneil Dolin, MD;  [facility];  Service: Endoscopy;  Laterality: N/A;  2:00 pm  . ESOPHAGOGASTRODUODENOSCOPY  09/2012   Rourk: gastric ulcer, gastric duodenal erosions, small hh. benign bx  .  ESOPHAGOGASTRODUODENOSCOPY N/A 10/29/2016   Procedure: ESOPHAGOGASTRODUODENOSCOPY (EGD);  Surgeon: Daneil Dolin, MD;  [facility]: [facility];  Service: Endoscopy;  Laterality: N/A;  . EYE SURGERY    . FOOT SURGERY  1994   left   . LUMBAR SPINE SURGERY  08/04/2014   dr Joya Salm  . macular degen    . MALONEY DILATION N/A 10/29/2016   Procedure: Venia Minks DILATION;  Surgeon: Daneil Dolin, MD;  [facility]: [facility];  Service: Endoscopy;   Laterality: N/A;  . MELANOMA EXCISION  2010  . MULTIPLE EXTRACTIONS WITH ALVEOLOPLASTY N/A 08/17/2018   Procedure: Extraction of tooth #'s 7,8, and 9 with alveoloplasty and gross debridement of remaining teeth;  Surgeon: Lenn Cal, DDS;  Location: [facility];  Service: Oral Surgery;  Laterality: N/A;  . RIGHT/LEFT HEART CATH AND CORONARY ANGIOGRAPHY N/A 08/11/2018   Procedure: RIGHT/LEFT HEART CATH AND CORONARY ANGIOGRAPHY;  Surgeon: Burnell Blanks, MD;  Location: [place] [facility];  Service: Cardiovascular;  Laterality: N/A;  . TEE WITHOUT CARDIOVERSION N/A 01/10/2019   Procedure: TRANSESOPHAGEAL ECHOCARDIOGRAM (TEE);  Surgeon: Burnell Blanks, MD;  Location: [facility];  Service: Open Heart Surgery;  Laterality: N/A;  . TRANSCATHETER AORTIC VALVE REPLACEMENT, TRANSFEMORAL N/A 01/10/2019   Procedure: TRANSCATHETER AORTIC VALVE REPLACEMENT, TRANSFEMORAL;  Surgeon: Burnell Blanks, MD;  Location: [place] [facility];  Service: Open Heart Surgery;  Laterality: N/A;  . TRANSCATHETER AORTIC VALVE REPLACEMENT, TRANSFEMORAL  12/2018  . YAG LASER APPLICATION     OS   FH  Family History  Problem Relation Age of Onset  . Colon cancer Brother        diagnosed at age 60  . Diabetes Brother   . Hypertension Mother   . Heart failure Mother   . Diabetes Mother   . Glaucoma Mother   . Hypertension Other   . Other Other   . Heart failure Father        passed away of MI in his 59s  . Diabetes Sister   . Hypertension Sister   . Breast cancer Sister   . Heart attack Brother   . Diabetes Sister   . Hypertension Sister   . Other Sister        tumor  . Hypertension Sister   . Hypertension Daughter   . Bipolar disorder Daughter   . Anesthesia problems Neg Hx   . Amblyopia Neg Hx   . Blindness Neg Hx   . Cataracts Neg Hx   . Macular degeneration Neg Hx   . Retinal detachment Neg Hx   . Strabismus Neg Hx   . Retinitis pigmentosa Neg Hx    SH  reports that she  has never smoked. She has never used smokeless tobacco. She reports that she does not drink alcohol or use drugs. Allergies No Known Allergies Home medications Prior to Admission medications   Medication Sig Start Date End Date Taking? Authorizing Provider  acetaminophen (TYLENOL) 325 MG tablet Take 1-2 tablets (325-650 mg total) by mouth every 4 (four) hours as needed for mild pain. 01/25/19  Yes Love, Ivan Anchors, PA-C  ALPRAZolam Duanne Moron) 0.5 MG tablet Take 0.5 mg by mouth at bedtime as needed for sleep.    Yes [provider]  amLODipine (NORVASC) 10 MG tablet Take 1 tablet by mouth once daily 09/21/19  Yes Herminio Commons, MD  aspirin EC 81 MG tablet Take 81 mg by mouth daily.    Yes [provider]  beta carotene w/minerals (OCUVITE) tablet Take 1 tablet by mouth daily.   Yes [provider]  fluticasone (FLONASE) 50 MCG/ACT nasal spray Place into both nostrils daily as needed for allergies or rhinitis.   Yes [provider]  furosemide (LASIX) 20 MG tablet Take 20 mg by mouth daily. 01/19/20  Yes [provider]  gabapentin (NEURONTIN) 300 MG capsule Take 900 mg by mouth 3 (three) times daily. 01/18/20  Yes [provider]  glipiZIDE (GLUCOTROL) 10 MG tablet Take 1 tablet (10 mg total) by mouth 2 (two) times daily before a meal. 01/25/19  Yes Love, Ivan Anchors, PA-C  hydrochlorothiazide (HYDRODIURIL) 25 MG tablet Take 25 mg by mouth daily. 01/18/20  Yes [provider]  HYDROcodone-acetaminophen (NORCO) 10-325 MG tablet Take 1 tablet by mouth 4 (four) times daily. 01/30/20  Yes [provider]  JARDIANCE 10 MG TABS tablet Take 10 mg by mouth every morning. 01/18/20  Yes [provider]  levothyroxine (SYNTHROID, LEVOTHROID) 88 MCG tablet Take 1 tablet (88 mcg total) by mouth daily. 01/25/19  Yes Love, Ivan Anchors, PA-C  metFORMIN (GLUCOPHAGE) 500 MG tablet Take 1 tablet (500 mg total) by mouth 2 (two) times daily with a meal. No  refills 08/30/19  Yes Herminio Commons, MD  potassium chloride SA (K-DUR,KLOR-CON) 20 MEQ tablet Take 1 tablet (20 mEq total) by mouth daily. 01/26/19  Yes Love, Ivan Anchors, PA-C  simvastatin (ZOCOR) 20 MG tablet Take 20 mg by mouth daily. 01/18/20  Yes [provider]  timolol (TIMOPTIC) 0.5 % ophthalmic solution Place 1 drop into both eyes 2 (two) times daily.    Yes [provider]  VENTOLIN HFA 108 (90 Base) MCG/ACT inhaler Inhale 2 puffs into the lungs every 4 (four) hours as needed for wheezing or shortness of breath.  06/22/18  Yes [provider]  vitamin B-12 (CYANOCOBALAMIN) 1000 MCG tablet Take 1,000 mcg by mouth daily.   Yes [provider]  zolpidem (AMBIEN) 5 MG tablet Take 5 mg by mouth at bedtime as needed. 01/20/20  Yes [provider]    Current Medications Scheduled Meds: . amLODipine  10 mg Oral Daily  . aspirin EC  81 mg Oral Daily  . budesonide (PULMICORT) nebulizer solution  0.5 mg Nebulization BID  . gabapentin  400 mg Oral TID  . heparin  5,000 Units Subcutaneous Q8H  . insulin aspart  0-20 Units Subcutaneous TID WC  . insulin aspart  0-5 Units Subcutaneous QHS  . insulin aspart  10 Units Subcutaneous TID WC  . insulin glargine  20 Units Subcutaneous Daily  . ipratropium-albuterol  3 mL Nebulization Q6H  . levothyroxine  125 mcg Oral Daily  . mouth rinse  15 mL Mouth Rinse BID  . methylPREDNISolone (SOLU-MEDROL) injection  60 mg Intravenous Q6H  . multivitamin-lutein  1 capsule Oral Daily  . simvastatin  20 mg Oral Daily  . sodium chloride flush  3 mL Intravenous Q12H  . timolol  1 drop Both Eyes BID  . vitamin B-12  1,000 mcg Oral Daily   Continuous Infusions: . sodium chloride    . sodium chloride 75 mL/hr at 02/11/20 1748  . azithromycin Stopped (02/11/20 1850)  . cefTRIAXone (ROCEPHIN)  IV Stopped (02/11/20 1730)   PRN Meds:.sodium chloride, acetaminophen **OR** acetaminophen, ALPRAZolam, benzonatate, bisacodyl,  guaiFENesin-dextromethorphan, HYDROcodone-acetaminophen, ipratropium-albuterol, ondansetron **OR** ondansetron (ZOFRAN) IV, polyethylene glycol, sodium chloride flush, zolpidem  CBC Recent Labs  Lab 01/23/2020 1443 02/10/20 0224 02/11/20 0540  WBC  11.7* 11.2* 16.6*  NEUTROABS 8.4*  --   --   HGB 11.7* 11.3* 10.8*  HCT 36.0 35.1* 33.9*  MCV 88.2 87.8 89.2  PLT 366 370 308   Basic Metabolic Panel Recent Labs  Lab 01/28/2020 1443 02/10/20 0224 02/11/20 0540 02/12/20 0416  NA 138 135 132* 127*  K 3.2* 3.8 4.8 5.2*  CL 99 97* 96* 93*  CO2 27 24 26 22   GLUCOSE 134* 458* 292* 260*  BUN 23 25* 43* 59*  CREATININE 2.01* 1.99* 2.34* 2.75*  CALCIUM 8.9 8.5* 8.5* 7.7*    Physical Exam  Blood pressure 127/75, pulse 70, temperature 98.9 F (37.2 C), resp. rate 18, height 4\' 9"  (1.448 m), weight 72.7 kg, SpO2 97 %. GEN: Chronically ill-appearing, mild increased work of breathing ENT: NCAT EYES: EOMI CV: Regular, normal S1 and S2 PULM: Diffuse scattered expiratory wheezing, no crackles ABD: Soft, nontender SKIN: No rashes or lesions EXT: No peripheral edema  Assessment 73F COPD exacacerabation and nonoliguric AoCKD3.  1. Nonoliguric AoCKD3; etiology unclear.  Doubt obstruction but ultrasound in process.  Doubt glomerular process but UA and UPC in process.  Given a fall prior to admission we will check a CK.  Suspect is related to her presenting symptoms and potentially some mild ATN. 2. COPD exacerbation, per primary, corticosteroids, bronchodilators, ceftriaxone/azithromycin 3. Severe aortic stenosis status post TAVR 01/10/2019 4. History of CVA 5. DM 2 6. Hypertension 7. CHF  Plan 1. Agree with cautious IV fluids for the next 24 hours, but then would probably consider her to be volume replete 2. Renal ultrasound, UA, CK in process 3. Will follow along closley 4. Daily weights, Daily Renal Panel, Strict I/Os, Avoid nephrotoxins (NSAIDs, judicious IV Contrast) 5.    [name]  [phone] 02/12/2020, 12:30 PM

## 2020-02-12 NOTE — Care Management Important Message (Signed)
Important Message  Patient Details  Name: [name] [name] MRN: [id number] Date of Birth: [birth year]   Medicare Important Message Given:  Yes     Tommy Medal 02/12/2020, 3:46 PM

## 2020-02-12 NOTE — Progress Notes (Signed)
PROGRESS NOTE  [name] [name] XFG:[id number] DOB: Nov 29, 1946 DOA: 02/10/2020 PCP: [employer]mond [employer], MD  Brief History: 74 year old female with a history of diastolic CHF, COPD, CKD stage III, diabetes mellitus type 2, hypertension, severe aortic stenosis status post TAVR 9/67/8938, embolic stroke presenting with 3-day history of generalized weakness, coughing, shortness of breath, and wheezing. The patient also had some unsteady gait and fell. She did not lose consciousness. The patient has not been able to use her nebulizer machine at home secondary to malfunctioning. She denies any fevers, chills, chest pain, vomiting, diarrhea, abdominal pain. Notably, the patient had a hospitalization from 01/10/2019 to2/02/2019 where she underwent TAVR. She had postoperative acute on chronic renal failure. Her postoperative course was also complicated by bilateral lower extremity weakness and myoclonus.She was seen by neurology at that time, and it was felt to be secondary to gabapentin toxicity in the setting of her acute on chronic renal failure. MRI of the brain did show cardioembolic stroke. CTA of the head and neck was negative for LVO. But it did show advanced intracranial atherosclerosis with moderate to severe right and moderate left intracranial ICA stenosis. The patient was subsequently discharged to [facility].  In the emergency department, the patient was afebrile hemodynamically stable with oxygen saturation 95% room air. WBC 11.7. Serum creatinine was 2.01. Chest x-ray showed bilateral interstitial opacities. The patient was started on Solu-Medrol for COPD exacerbation.  Assessment/Plan: Acute Respiratory Failure with hypoxia -due to PNA and COPD exacerbation -stable on 2L -wean oxygen for saturation <>92%  COPD exacerbation -Continue Pulmicort -Continue IV Solu-Medrol -Continue duo nebs -continues to have dyspnea on exertion  R-lungopacity/CAP -Continue empiric  ceftriaxone and azithromycin -Procalcitonin<0.10 -CT chest--Patchy opacities at the right lung apex with surrounding ground-glass and peripheral predominance; bronchomalacia; circumferential esophageal thickening  Acute on chronic renal failure CKD stage IIIb -Baseline creatinine 1.5-1.6 -Suspect the patient may have a degree of progression of her underlying renal disease -serum creatinine continues to rise -nephrology consult--discussed with Dr. Joelyn Oms -hold diuretics today -judicious IVF -renal US  Chronic diastolic CHF -Daily weights -appears clinically euvolemic -2/27 echo--EF 55-60%, no WMA, G1DD, mild MR, s/pTAVR  Uncontrolled diabetes mellitus type 2 with hyperglycemia -2/27/21Hemoglobin A1c--6.6 -increase Lantus 25 units daily -NovoLog sliding scale -Increase NovoLog 14 units with meals -Holding glipizide, Jardiance, Metformin -elevated cbg due to steroids  Hypertension -Continue amlodipine -controlled  Hypothyroidism -TSH 32.53 -Increase Synthroid  Hyperlipidemia -Continue statin  Frequent falls -PT evaluation -orthostatic vital signs neg  History of stroke -Continue aspirin  History of TAVR -Underwent TAVR 01/10/2019 -finished 6 months DAPT         Disposition Plan: Patient From: Home D/C Place: SNF - 1-2 Days Barriers: Not Clinically Stable--remains on IV abx and IV steroids.  Renal function worsening  Family Communication:Daughter updated on 2/28  Consultants:none  Code Status: FULL   DVT Prophylaxis: [place] Heparin   Procedures: As Listed in Progress Note Above  Antibiotics: Ceftriaxone 2/26>>> azithro 2/26>>>      Subjective: Pt states breathing is improving.  Denies f/c,cp, n/v/d.  Has dyspnea on exertion.  Complains of cough  Objective: Vitals:   02/12/20 0127 02/12/20 0421 02/12/20 0748 02/12/20 0753  BP:  127/75    Pulse:  70    Resp:      Temp:  98.9 F (37.2 C)    TempSrc:       SpO2: 96% 97% 97% 97%  Weight:  72.7 kg  Height:        Intake/Output Summary (Last 24 hours) at 02/12/2020 1348 Last data filed at 02/12/2020 0700 Gross per 24 hour  Intake 2083.79 ml  Output 1100 ml  Net 983.79 ml   Weight change: 2.1 kg Exam:   General:  Pt is alert, follows commands appropriately, not in acute distress  HEENT: No icterus, No thrush, No neck mass, [place]/AT  Cardiovascular: RRR, S1/S2, no rubs, no gallops  Respiratory: bibasilar wheeze.  Bibasilar rales.   Abdomen: Soft/+BS, non tender, non distended, no guarding  Extremities: Non pitting edema, No lymphangitis, No petechiae, No rashes, no synovitis   Data Reviewed: I have personally reviewed following labs and imaging studies Basic Metabolic Panel: Recent Labs  Lab 01/15/2020 1443 02/10/20 0224 02/10/20 0918 02/11/20 0540 02/12/20 0416  NA 138 135  --  132* 127*  K 3.2* 3.8  --  4.8 5.2*  CL 99 97*  --  96* 93*  CO2 27 24  --  26 22  GLUCOSE 134* 458*  --  292* 260*  BUN 23 25*  --  43* 59*  CREATININE 2.01* 1.99*  --  2.34* 2.75*  CALCIUM 8.9 8.5*  --  8.5* 7.7*  MG  --   --  2.5* 2.6*  --    Liver Function Tests: No results for input(s): AST, ALT, ALKPHOS, BILITOT, PROT, ALBUMIN in the last 168 hours. No results for input(s): LIPASE, AMYLASE in the last 168 hours. No results for input(s): AMMONIA in the last 168 hours. Coagulation Profile: No results for input(s): INR, PROTIME in the last 168 hours. CBC: Recent Labs  Lab 02/06/2020 1443 02/10/20 0224 02/11/20 0540  WBC 11.7* 11.2* 16.6*  NEUTROABS 8.4*  --   --   HGB 11.7* 11.3* 10.8*  HCT 36.0 35.1* 33.9*  MCV 88.2 87.8 89.2  PLT 366 370 378   Cardiac Enzymes: No results for input(s): CKTOTAL, CKMB, CKMBINDEX, TROPONINI in the last 168 hours. BNP: Invalid input(s): POCBNP CBG: Recent Labs  Lab 02/11/20 1116 02/11/20 1608 02/11/20 2117 02/12/20 0728 02/12/20 1102  GLUCAP 395* 365* 295* 322* 253*   HbA1C: Recent Labs     02/10/20 0224  HGBA1C 6.6*   Urine analysis:    Component Value Date/Time   COLORURINE YELLOW 01/30/2020 1815   APPEARANCEUR HAZY (A) 01/24/2020 1815   LABSPEC 1.009 01/16/2020 1815   PHURINE 6.0 02/11/2020 1815   GLUCOSEU >=500 (A) 02/08/2020 1815   HGBUR NEGATIVE 01/27/2020 1815   BILIRUBINUR NEGATIVE 01/29/2020 1815   KETONESUR NEGATIVE 01/26/2020 1815   PROTEINUR >=300 (A) 01/22/2020 1815   UROBILINOGEN 0.2 09/23/2013 1926   NITRITE NEGATIVE 01/18/2020 1815   LEUKOCYTESUR NEGATIVE 01/17/2020 1815   Sepsis Labs: @LABRCNTIP (procalcitonin:4,lacticidven:4) ) Recent Results (from the past 240 hour(s))  SARS CORONAVIRUS 2 (Minah Axelrod 6-24 HRS) Nasopharyngeal Nasopharyngeal Swab     Status: None   Collection Time: 01/16/2020  7:58 PM   Specimen: Nasopharyngeal Swab  Result Value Ref Range Status   SARS Coronavirus 2 NEGATIVE NEGATIVE Final    Comment: (NOTE) SARS-CoV-2 target nucleic acids are NOT DETECTED. The SARS-CoV-2 RNA is generally detectable in upper and lower respiratory specimens during the acute phase of infection. Negative results do not preclude SARS-CoV-2 infection, do not rule out co-infections with other pathogens, and should not be used as the sole basis for treatment or other patient management decisions. Negative results must be combined with clinical observations, patient history, and epidemiological information. The expected result is Negative. Fact Sheet  for Patients: SugarRoll.be Fact Sheet for Healthcare Providers: https://www.woods-mathews.com/ This test is not yet approved or cleared by the [place] FDA and  has been authorized for detection and/or diagnosis of SARS-CoV-2 by FDA under an Emergency Use Authorization (EUA). This EUA will remain  in effect (meaning this test can be used) for the duration of the COVID-19 declaration under Section 56 4(b)(1) of the Act, 21 U.S.C. section 360bbb-3(b)(1), unless the  authorization is terminated or revoked sooner. Performed at [facility], [place] [address] [place], [place] [address]   Respiratory Panel by PCR     Status: Abnormal   Collection Time: 02/11/20  6:10 PM   Specimen: Nasopharyngeal Swab; Respiratory  Result Value Ref Range Status   Adenovirus NOT DETECTED NOT DETECTED Final   Coronavirus 229E NOT DETECTED NOT DETECTED Final    Comment: (NOTE) The Coronavirus on the Respiratory Panel, DOES NOT test for the novel  Coronavirus (2019 nCoV)    Coronavirus HKU1 NOT DETECTED NOT DETECTED Final   Coronavirus NL63 NOT DETECTED NOT DETECTED Final   Coronavirus OC43 NOT DETECTED NOT DETECTED Final   Metapneumovirus NOT DETECTED NOT DETECTED Final   Rhinovirus / Enterovirus DETECTED (A) NOT DETECTED Final   Influenza A NOT DETECTED NOT DETECTED Final   Influenza B NOT DETECTED NOT DETECTED Final   Parainfluenza Virus 1 NOT DETECTED NOT DETECTED Final   Parainfluenza Virus 2 NOT DETECTED NOT DETECTED Final   Parainfluenza Virus 3 NOT DETECTED NOT DETECTED Final   Parainfluenza Virus 4 NOT DETECTED NOT DETECTED Final   Respiratory Syncytial Virus NOT DETECTED NOT DETECTED Final   Bordetella pertussis NOT DETECTED NOT DETECTED Final   Chlamydophila pneumoniae NOT DETECTED NOT DETECTED Final   Mycoplasma pneumoniae NOT DETECTED NOT DETECTED Final    Comment: Performed at [facility], [place]. [address], [place], [place] [address]     Scheduled Meds: . amLODipine  10 mg Oral Daily  . aspirin EC  81 mg Oral Daily  . budesonide (PULMICORT) nebulizer solution  0.5 mg Nebulization BID  . gabapentin  400 mg Oral TID  . heparin  5,000 Units Subcutaneous Q8H  . insulin aspart  0-20 Units Subcutaneous TID WC  . insulin aspart  0-5 Units Subcutaneous QHS  . insulin aspart  10 Units Subcutaneous TID WC  . insulin glargine  20 Units Subcutaneous Daily  . ipratropium-albuterol  3 mL Nebulization Q6H  . levothyroxine  125 mcg Oral Daily  . mouth  rinse  15 mL Mouth Rinse BID  . methylPREDNISolone (SOLU-MEDROL) injection  60 mg Intravenous Q6H  . multivitamin-lutein  1 capsule Oral Daily  . simvastatin  20 mg Oral Daily  . sodium chloride flush  3 mL Intravenous Q12H  . timolol  1 drop Both Eyes BID  . vitamin B-12  1,000 mcg Oral Daily   Continuous Infusions: . sodium chloride    . sodium chloride 75 mL/hr at 02/11/20 1748  . azithromycin Stopped (02/11/20 1850)  . cefTRIAXone (ROCEPHIN)  IV Stopped (02/11/20 1730)    Procedures/Studies: CT CHEST WO CONTRAST  Result Date: 02/10/2020 CLINICAL DATA:  Cough, persistent COPD exacerbation. Chest x-ray with questionable abnormalities. EXAM: CT CHEST WITHOUT CONTRAST TECHNIQUE: Multidetector CT imaging of the chest was performed following the standard protocol without IV contrast. COMPARISON:  Chest x-ray of 01/27/2020 and CT a chest abdomen and pelvis from 08/18/2018 FINDINGS: Cardiovascular: Calcified atherosclerotic changes and three-vessel coronary artery disease with evidence of prior trans arterial aortic  valve replacement. Heart size mildly enlarged without pericardial effusion. Limited assessment of vascular structures due to lack of intravenous contrast. Central pulmonary vasculature mildly dilated at 2.7 cm. Mediastinum/Nodes: Mildly enlarged AP window lymph node 11 mm not changed since 2019. Scattered small lymph nodes throughout the chest none with pathologic enlargement. Hilar structures not well evaluated. No thoracic inlet adenopathy. No thoracic inlet adenopathy. Esophagus with mild circumferential thickening distally. Lungs/Pleura: Patchy opacities at the right lung apex with surrounding ground-glass and peripheral predominance. Some of these areas show small central solid-appearing nodules, largest measuring 4 mm (image 55, series 5) process appears confined to the right upper lobe. Airways are patent. Some distortion of the right hilum and mild narrowing of the bronchus  intermedius is unchanged and may reflect mild bronchomalacia. Scattered areas of scarring in the lingula are stable. Upper Abdomen: Incidental imaging of upper abdominal contents showing no signs of acute abnormality. Musculoskeletal: No acute bone finding or destructive bone process. IMPRESSION: 1. Patchy opacities at the right lung apex with surrounding ground-glass and peripheral predominance. Process appears confined to mainly to the right upper lobe. This would be atypical for metastases. Infection with viral or atypical process is considered. Correlation with blood cultures may be helpful to exclude hematogenous process in this patient with aortic valve replacement. 2. Stable mildly enlarged AP window lymph node. Attention on follow-up 3. Mild circumferential thickening of the distal esophagus may reflect esophagitis. 4. Calcified atherosclerotic changes and three-vessel coronary artery disease with evidence of prior trans arterial aortic valve replacement. Aortic Atherosclerosis (ICD10-I70.0). Electronically Signed   By: Zetta Bills M.D.   On: 02/10/2020 17:58   DG Chest Portable 1 View  Result Date: 02/01/2020 CLINICAL DATA:  Wheezing EXAM: PORTABLE CHEST 1 VIEW COMPARISON:  08/29/2019 FINDINGS: Stable mild cardiomegaly status post TAVR. Calcific aortic knob. Bilateral interstitial opacities, right worse than left with areas of subtle nodularity most prominent in the right upper lobe. No pleural effusion or pneumothorax. IMPRESSION: Bilateral interstitial opacities, right worse than left with areas of subtle nodularity most prominent in the right upper lobe. Findings could reflect atypical or viral pneumonia. Radiographic follow-up to resolution is recommended. Electronically Signed   By: Davina Poke D.O.   On: 02/10/2020 14:46   ECHOCARDIOGRAM COMPLETE  Result Date: 02/10/2020    ECHOCARDIOGRAM REPORT   Patient Name:   TONIA AVINO Date of Exam: 02/10/2020 Medical Rec #:  [id number]          Height:       57.0 in Accession #:    [id number]        Weight:       150.4 lb Date of Birth:  1946-09-27         BSA:          1.593 m Patient Age:    71 years          BP:           128/61 mmHg Patient Gender: F                 HR:           75 bpm. Exam Location:  Inpatient Procedure: 2D Echo and Strain Analysis Indications:    Aortic Stenosis 424.1 / 135.0  History:        Patient has prior history of Echocardiogram examinations, most                 recent 01/11/2019. Stroke, Signs/Symptoms:Shortness of Breath;  Risk Factors:Hypertension, Diabetes and Dyslipidemia. Pneumonia.                 Chronic kidney disease. Bilateral carotid stenosis.                 Aortic Valve: 23 mm Edwards Sapien prosthetic, stented (TAVR)                 valve is present in the aortic position. Procedure Date:                 01/10/2019.  Sonographer:    Darlina Sicilian RDCS Referring Phys: [id number] [place]  1. Left ventricular ejection fraction, by estimation, is 55 to 60%. The left ventricle has normal function. The left ventricle has no regional wall motion abnormalities. There is moderate concentric left ventricular hypertrophy. Left ventricular diastolic parameters are consistent with Grade I diastolic dysfunction (impaired relaxation). Elevated left ventricular end-diastolic pressure.  2. Right ventricular systolic function is normal. The right ventricular size is normal.  3. The mitral valve is normal in structure and function. Mild mitral annular calcification. Mild mitral valve regurgitation. No evidence of mitral stenosis.  4. The aortic valve has been repaired/replaced. There is trivial perivalvular AI ipresent. No aortic stenosis is present. There is a 23 mm Edwards Sapien prosthetic (TAVR) valve present in the aortic position. that is funcitoning normaly. Aortic valve mean gradient measures 12.0 mmHg. Aortic valve peak gradient measures 21.8 mmHg. Aortic valve area, by VTI measures  1.66 cm. Dimensionless index is 0.44 and the AVA is calculated to be 1.51cm2.  5. The inferior vena cava is normal in size with greater than 50% respiratory variability, suggesting right atrial pressure of 3 mmHg.  6. Compared to prior echo, the mean TAVR gradient has increased from 11 to 42mmhg and AVG is 1.51cm2 (was 1.88cm2 on last echo). FINDINGS  Left Ventricle: Left ventricular ejection fraction, by estimation, is 55 to 60%. The left ventricle has normal function. The left ventricle has no regional wall motion abnormalities. The left ventricular internal cavity size was normal in size. There is  moderate concentric left ventricular hypertrophy. Left ventricular diastolic parameters are consistent with Grade I diastolic dysfunction (impaired relaxation). Elevated left ventricular end-diastolic pressure. Right Ventricle: The right ventricular size is normal. No increase in right ventricular wall thickness. Right ventricular systolic function is normal. Left Atrium: Left atrial size was normal in size. Right Atrium: Right atrial size was normal in size. Pericardium: Trivial pericardial effusion is present. The pericardial effusion is posterior to the left ventricle. Mitral Valve: The mitral valve is normal in structure and function. Normal mobility of the mitral valve leaflets. Mild mitral annular calcification. Mild mitral valve regurgitation. No evidence of mitral valve stenosis. Tricuspid Valve: The tricuspid valve is normal in structure. Tricuspid valve regurgitation is not demonstrated. No evidence of tricuspid stenosis. Aortic Valve: The aortic valve has been repaired/replaced. Aortic valve regurgitation trivial perivalvular AI is present. No aortic stenosis is present. Aortic valve mean gradient measures 12.0 mmHg. Aortic valve peak gradient measures 21.8 mmHg. Aortic valve area, by VTI measures 1.66 cm. There is a 23 mm Edwards Sapien prosthetic, stented (TAVR) valve present in the aortic position.  Procedure Date: 01/10/2019. Pulmonic Valve: The pulmonic valve was normal in structure. Pulmonic valve regurgitation is not visualized. No evidence of pulmonic stenosis. Aorta: The aortic root is normal in size and structure. Venous: The inferior vena cava is normal in size with greater than 50% respiratory variability,  suggesting right atrial pressure of 3 mmHg. IAS/Shunts: No atrial level shunt detected by color flow Doppler.  LEFT VENTRICLE PLAX 2D LVIDd:         4.79 cm      Diastology LVIDs:         3.43 cm      LV e' lateral:   4.35 cm/s LV PW:         1.22 cm      LV E/e' lateral: 29.7 LV IVS:        1.34 cm      LV e' medial:    3.00 cm/s LVOT diam:     2.20 cm      LV E/e' medial:  43.0 LV SV:         82 LV SV Index:   52 LVOT Area:     3.80 cm  LV Volumes (MOD) LV vol d, MOD A2C: 99.5 ml LV vol d, MOD A4C: 125.0 ml LV vol s, MOD A2C: 50.2 ml LV vol s, MOD A4C: 56.4 ml LV SV MOD A2C:     49.3 ml LV SV MOD A4C:     125.0 ml LV SV MOD BP:      58.3 ml RIGHT VENTRICLE RV S prime:     11.20 cm/s TAPSE (M-mode): 1.6 cm LEFT ATRIUM             Index       RIGHT ATRIUM           Index LA diam:        3.60 cm 2.26 cm/m  RA Area:     15.20 cm LA Vol (A2C):   55.7 ml 34.96 ml/m RA Volume:   34.70 ml  21.78 ml/m LA Vol (A4C):   49.6 ml 31.13 ml/m LA Biplane Vol: 53.2 ml 33.39 ml/m  AORTIC VALVE AV Area (Vmax):    1.51 cm AV Area (Vmean):   1.61 cm AV Area (VTI):     1.66 cm AV Vmax:           233.50 cm/s AV Vmean:          162.000 cm/s AV VTI:            0.496 m AV Peak Grad:      21.8 mmHg AV Mean Grad:      12.0 mmHg LVOT Vmax:         92.50 cm/s LVOT Vmean:        68.400 cm/s LVOT VTI:          0.217 m LVOT/AV VTI ratio: 0.44  AORTA Ao Root diam: 3.10 cm Ao Asc diam:  3.00 cm MITRAL VALVE MV Area (PHT): 3.21 cm     SHUNTS MV Decel Time: 236 msec     Systemic VTI:  0.22 m MV E velocity: 129.00 cm/s  Systemic Diam: 2.20 cm MV A velocity: 144.00 cm/s MV E/A ratio:  0.90 Fransico Him MD Electronically signed  by Fransico Him MD Signature Date/Time: 02/10/2020/5:45:00 PM    Final     Orson Eva, DO  [facility]  If 7PM-7AM, please contact night-coverage [email or web address] Password [email or web address] 02/12/2020, 1:48 PM   LOS: 3 days

## 2020-02-12 NOTE — Evaluation (Signed)
Physical Therapy Evaluation Patient Details Name: [name] [name] MRN: [id number] DOB: 22-Dec-1945 Today's Date: 02/12/2020   History of Present Illness  74 year old female with a history of diastolic CHF, COPD, CKD stage III, diabetes mellitus type 2, hypertension, severe aortic stenosis status post TAVR 4/48/1856, embolic stroke presenting with 3-day history of generalized weakness, coughing, shortness of breath, and wheezing.  The patient also had some unsteady gait and fell.  She did not lose consciousness.  The patient has not been able to use her nebulizer machine at home secondary to malfunctioning.  She denies any fevers, chills, chest pain, vomiting, diarrhea, abdominal pain.  Notably, the patient had a hospitalization from 01/10/2019 to 01/16/2019 where she underwent TAVR.  She had postoperative acute on chronic renal failure.  Her postoperative course was also complicated by bilateral lower extremity weakness and myoclonus.  She was seen by neurology at that time, and it was felt to be secondary to gabapentin toxicity in the setting of her acute on chronic renal failure.  MRI of the brain did show cardioembolic stroke.  CTA of the head and neck was negative for LVO.  But it did show advanced intracranial atherosclerosis with moderate to severe right and moderate left intracranial ICA stenosis.  The patient was subsequently discharged to CIR.    Clinical Impression  Pt admitted with above diagnosis. Pt limited by moderate SOB with activity, audible wheezing, and BLE buckling with weight acceptance when transferring and taking steps at bedside. Pt adamant that family is able to assist her at home with mobility, but recommending SNF due to weakness and assistance required at this time. Pt not tried on RA due to moderate SOB with mobility and audible wheezing; on 2LPM O2 and O2 sat 94% with mobility. Pt currently with functional limitations due to the deficits listed below (see PT Problem List). Pt will  benefit from skilled PT to increase their independence and safety with mobility to allow discharge to the venue listed below.       Follow Up Recommendations SNF;Supervision/Assistance - 24 hour;Supervision for mobility/OOB    Equipment Recommendations  3in1 (PT)    Recommendations for Other Services       Precautions / Restrictions Precautions Precautions: Fall Restrictions Weight Bearing Restrictions: No      Mobility  Bed Mobility Overal bed mobility: Needs Assistance Bed Mobility: Supine to Sit     Supine to sit: Min guard;HOB elevated     General bed mobility comments: slow, labored movement, elevated HOB with use of bedrail to upright trunk and assist in scooting to EOB  Transfers Overall transfer level: Needs assistance Equipment used: Rolling walker (2 wheeled) Transfers: Sit to/from [facility] Sit to Stand: Mod assist Stand pivot transfers: Mod assist       General transfer comment: unsteadiness upon standing with BLE buckling 5-10degrees requiring mod assist to maintain upright standing, moderate SOB with audible wheezing during transfers  Ambulation/Gait Ambulation/Gait assistance: Mod assist Gait Distance (Feet): 1 Feet Assistive device: Rolling walker (2 wheeled) Gait Pattern/deviations: Decreased step length - right;Decreased step length - left;Decreased stride length;Shuffle Gait velocity: decreased   General Gait Details: limited to 2-3 shuffling, labored steps at bedside with audible wheezing, BLE buckling with poor control in weight acceptance and unable to improve with verbal and tactile cues  Stairs            Wheelchair Mobility    Modified Rankin (Stroke Patients Only)       Balance Overall balance assessment:  Needs assistance Sitting-balance support: Feet supported;No upper extremity supported Sitting balance-Leahy Scale: Good Sitting balance - Comments: seated EOB   Standing balance support: During  functional activity;Bilateral upper extremity supported Standing balance-Leahy Scale: Poor Standing balance comment: with RW            Pertinent Vitals/Pain Pain Assessment: No/denies pain    Home Living Family/patient expects to be discharged to:: Private residence Living Arrangements: Children;Other relatives(2 daughters, 2 granddaughters) Available Help at Discharge: Family;Available 24 hours/day Type of Home: House Home Access: Stairs to enter Entrance Stairs-Rails: Right(brick column) Entrance Stairs-Number of Steps: 1 Home Layout: One level Home Equipment: Walker - 2 wheels;Cane - single point;Shower seat - built in;Wheelchair - manual Additional Comments: Pt reports using manual WC when BLE buckling occurrs.    Prior Function Level of Independence: Independent with assistive device(s)         Comments: Pt reports independent with bathing, dressing, ambulating community distances using RW or pushing shopping cart, and getting in/out of bed and chairs. Pt reports family completes cooking, cleaning, driving. Pt reports daily falls due to low blood sugar or BLE buckling.     Hand Dominance        Extremity/Trunk Assessment   Upper Extremity Assessment Upper Extremity Assessment: Defer to OT evaluation    Lower Extremity Assessment Lower Extremity Assessment: Generalized weakness(BLE grossly 3/5, BLE buckling upon standing)    Cervical / Trunk Assessment Cervical / Trunk Assessment: Normal  Communication   Communication: No difficulties  Cognition Arousal/Alertness: Awake/alert Behavior During Therapy: WFL for tasks assessed/performed;Flat affect Overall Cognitive Status: Within Functional Limits for tasks assessed                 General Comments General comments (skin integrity, edema, etc.): on 2LPM O2, O2 sat 97-98% seated EOB and decreased to 94% with transfers and sidestepping in room, moderate SOB and audible wheezing noted    Exercises      Assessment/Plan    PT Assessment Patient needs continued PT services  PT Problem List Decreased strength;Decreased range of motion;Decreased activity tolerance;Decreased balance;Decreased mobility;Decreased knowledge of use of DME;Cardiopulmonary status limiting activity       PT Treatment Interventions DME instruction;Gait training;Stair training;Functional mobility training;Therapeutic activities;Therapeutic exercise;Balance training;Neuromuscular re-education;Patient/family education;Wheelchair mobility training;Manual techniques    PT Goals (Current goals can be found in the Care Plan section)  Acute Rehab PT Goals Patient Stated Goal: return home with family to assist PT Goal Formulation: With patient Time For Goal Achievement: 02/26/20 Potential to Achieve Goals: Good    Frequency Min 3X/week   Barriers to discharge        Co-evaluation               AM-PAC PT "6 Clicks" Mobility  Outcome Measure Help needed turning from your back to your side while in a flat bed without using bedrails?: A Lot Help needed moving from lying on your back to sitting on the side of a flat bed without using bedrails?: A Lot Help needed moving to and from a bed to a chair (including a wheelchair)?: A Lot Help needed standing up from a chair using your arms (e.g., wheelchair or bedside chair)?: A Lot Help needed to walk in hospital room?: A Lot Help needed climbing 3-5 steps with a railing? : Total 6 Click Score: 11    End of Session Equipment Utilized During Treatment: Gait belt;Oxygen Activity Tolerance: Patient limited by fatigue Patient left: in chair;with call bell/phone within reach;with chair alarm  set Nurse Communication: Mobility status;Other (comment)(O2 sat and audible wheezing) PT Visit Diagnosis: Unsteadiness on feet (R26.81);Other abnormalities of gait and mobility (R26.89);Muscle weakness (generalized) (M62.81);History of falling (Z91.81)    Time: 4290-3795 PT Time  Calculation (min) (ACUTE ONLY): 26 min   Charges:   PT Evaluation $PT Eval Moderate Complexity: 1 Mod          Tori Denyce Harr PT, DPT 02/12/20, 12:52 PM [phone]

## 2020-02-12 NOTE — [place] FL2 (Signed)
[place] MEDICAID FL2 LEVEL OF CARE SCREENING TOOL     IDENTIFICATION  Patient Name: [name] [name] Birthdate: December 08, 1946 Sex: female Admission Date (Current Location): 02/10/2020  [facility] and [place] Number:  [employer] and Address:  [facility] [address], [place]      Provider Number: [id number]  Attending Physician Name and Address:  Orson Eva, MD  Relative Name and Phone Number:  [name] - daughter [phone]    Current Level of Care: Hospital Recommended Level of Care: [place] Prior Approval Number:    Date Approved/Denied:   PASRR Number: [id number]  Discharge Plan: SNF    Current Diagnoses: Patient Active Problem List   Diagnosis Date Noted  . Acute renal failure superimposed on stage 3b chronic kidney disease ([place]) 02/11/2020  . Acute hypoxemic respiratory failure ([place]) 02/08/2020  . Acute blood loss anemia 01/27/2019  . Acute on chronic renal failure ([place]) 01/25/2019  . Hypokalemia   . Leukocytosis   . Metabolic encephalopathy   . Chronic diastolic congestive heart failure ([place])   . Diabetes mellitus type 2 in obese ([place])   . Embolic stroke ([place]) 54/62/7035  . AKI (acute kidney injury) ([place]) 01/15/2019  . Drug toxicity 01/15/2019  . Intracranial carotid stenosis 01/15/2019  . 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 Severe aortic stenosis   . Rheumatoid arthritis ([place])   . Hypothyroidism   . Diabetes mellitus, type 2 ([place])   . Chronic diastolic CHF (congestive heart failure) ([place]) 08/18/2018  . CKD (chronic kidney disease) stage 3, GFR 30-59 ml/min 08/18/2018  . Acute exacerbation of chronic bronchitis ([place]) 08/18/2018  . Frequent falls 02/02/2018  . History of gastric ulcer 10/16/2016  . Dysphagia 10/16/2016  . Spondylolisthesis of lumbar region 08/03/2014  . Essential hypertension 08/02/2014  . Hyperlipidemia 08/02/2014  . Primary osteoarthritis of  both knees 06/14/2014  . Right leg weakness 10/31/2013  . H/O adenomatous polyp of colon 08/17/2012  . Family history of colon cancer 08/17/2012  . Chronic nausea 08/17/2012    Orientation RESPIRATION BLADDER Height & Weight     Self, Time, Situation, Place  Normal Continent Weight: 72.7 kg Height:  4\' 9"  (144.8 cm)  BEHAVIORAL SYMPTOMS/MOOD NEUROLOGICAL BOWEL NUTRITION STATUS      Continent Diet(heart healthy/ car modified)  AMBULATORY STATUS COMMUNICATION OF NEEDS Skin   Extensive Assist Verbally Normal                       Personal Care Assistance Level of Assistance  Bathing, Dressing, Feeding Bathing Assistance: Limited assistance Feeding assistance: Independent Dressing Assistance: Limited assistance     Functional Limitations Info  Sight, Hearing, Speech Sight Info: Adequate Hearing Info: Impaired Speech Info: Adequate    SPECIAL CARE FACTORS FREQUENCY  PT (By licensed PT)     PT Frequency: 5 times a week              Contractures Contractures Info: Not present    Additional Factors Info  Code Status, Allergies Code Status Info: FULL Allergies Info: NKDA           Current Medications (02/12/2020):  This is the current hospital active medication list Current Facility-Administered Medications  Medication Dose Route Frequency Provider Last Rate Last Admin  . 0.9 %  sodium chloride infusion  250 mL Intravenous PRN Lynetta Mare, MD      . 0.9 %  sodium chloride infusion  Intravenous Continuous Orson Eva, MD 75 mL/hr at 02/11/20 1748 New Bag at 02/11/20 1748  . acetaminophen (TYLENOL) tablet 650 mg  650 mg Oral Q6H PRN Lynetta Mare, MD       Or  . acetaminophen (TYLENOL) suppository 650 mg  650 mg Rectal Q6H PRN Lynetta Mare, MD      . ALPRAZolam Duanne Moron) tablet 0.5 mg  0.5 mg Oral QHS PRN Lynetta Mare, MD   0.5 mg at 02/10/20 2209  . amLODipine (NORVASC) tablet 10 mg  10 mg Oral Daily Lynetta Mare, MD   10 mg at 02/12/20  1000  . aspirin EC tablet 81 mg  81 mg Oral Daily Lynetta Mare, MD   81 mg at 02/12/20 1000  . azithromycin (ZITHROMAX) 500 mg in sodium chloride 0.9 % 250 mL IVPB  500 mg Intravenous Q24H Lynetta Mare, MD   Stopped at 02/11/20 1850  . benzonatate (TESSALON) capsule 100 mg  100 mg Oral TID PRN Lynetta Mare, MD      . bisacodyl (DULCOLAX) suppository 10 mg  10 mg Rectal Daily PRN Lynetta Mare, MD      . budesonide (PULMICORT) nebulizer solution 0.5 mg  0.5 mg Nebulization BID Tat, David, MD   0.5 mg at 02/12/20 0753  . cefTRIAXone (ROCEPHIN) 1 g in sodium chloride 0.9 % 100 mL IVPB  1 g Intravenous Q24H Lynetta Mare, MD   Stopped at 02/11/20 1730  . gabapentin (NEURONTIN) capsule 400 mg  400 mg Oral TID Lynetta Mare, MD   400 mg at 02/12/20 1000  . guaiFENesin-dextromethorphan (ROBITUSSIN DM) 100-10 MG/5ML syrup 10 mL  10 mL Oral Q4H PRN Lynetta Mare, MD   10 mL at 02/11/20 2117  . heparin injection 5,000 Units  5,000 Units Subcutaneous Q8H Lynetta Mare, MD   5,000 Units at 02/12/20 1400  . HYDROcodone-acetaminophen (NORCO) 10-325 MG per tablet 1 tablet  1 tablet Oral Q6H PRN Lynetta Mare, MD   1 tablet at 02/10/20 1952  . insulin aspart (novoLOG) injection 0-20 Units  0-20 Units Subcutaneous TID WC Orson Eva, MD   11 Units at 02/12/20 1205  . insulin aspart (novoLOG) injection 0-5 Units  0-5 Units Subcutaneous Benay Pike, MD   3 Units at 02/11/20 2118  . insulin aspart (novoLOG) injection 14 Units  14 Units Subcutaneous TID WC Tat, Shanon Brow, MD      . Derrill Memo ON 02/13/2020] insulin glargine (LANTUS) injection 25 Units  25 Units Subcutaneous Daily Tat, David, MD      . ipratropium-albuterol (DUONEB) 0.5-2.5 (3) MG/3ML nebulizer solution 3 mL  3 mL Nebulization Q4H PRN Lynetta Mare, MD   3 mL at 02/10/20 0750  . ipratropium-albuterol (DUONEB) 0.5-2.5 (3) MG/3ML nebulizer solution 3 mL  3 mL Nebulization Q6H Orson Eva, MD   3 mL at 02/12/20 0748  .  levothyroxine (SYNTHROID) tablet 125 mcg  125 mcg Oral Daily Tat, Shanon Brow, MD   125 mcg at 02/12/20 0517  . MEDLINE mouth rinse  15 mL Mouth Rinse BID Lynetta Mare, MD   15 mL at 02/12/20 1000  . methylPREDNISolone sodium succinate (SOLU-MEDROL) 125 mg/2 mL injection 60 mg  60 mg Intravenous Nicholes Calamity, MD   60 mg at 02/12/20 4944  . multivitamin-lutein (OCUVITE-LUTEIN) capsule 1 capsule  1 capsule Oral Daily Tat, David, MD   1 capsule at 02/12/20 1000  . ondansetron (ZOFRAN) tablet 4 mg  4 mg Oral Q6H PRN Lynetta Mare, MD       Or  . ondansetron [facility]) injection 4 mg  4 mg Intravenous Q6H PRN Lynetta Mare, MD      . polyethylene glycol (MIRALAX / GLYCOLAX) packet 17 g  17 g Oral Daily PRN Lynetta Mare, MD      . simvastatin (ZOCOR) tablet 20 mg  20 mg Oral Daily Lynetta Mare, MD   20 mg at 02/12/20 1000  . sodium chloride flush (NS) 0.9 % injection 3 mL  3 mL Intravenous Q12H Lynetta Mare, MD   3 mL at 02/12/20 1000  . sodium chloride flush (NS) 0.9 % injection 3 mL  3 mL Intravenous PRN Lynetta Mare, MD      . timolol (TIMOPTIC) 0.5 % ophthalmic solution 1 drop  1 drop Both Eyes BID Lynetta Mare, MD   1 drop at 02/12/20 1000  . vitamin B-12 (CYANOCOBALAMIN) tablet 1,000 mcg  1,000 mcg Oral Daily Vivia Ewing M, MD   1,000 mcg at 02/12/20 1000  . zolpidem (AMBIEN) tablet 5 mg  5 mg Oral QHS PRN Lynetta Mare, MD   5 mg at 02/11/20 2124     Discharge Medications: Please see discharge summary for a list of discharge medications.  Relevant Imaging Results:  Relevant Lab Results:   Additional Information SS# [id number]  Boneta Lucks, RN

## 2020-02-12 NOTE — Plan of Care (Signed)
  Problem: Acute Rehab PT Goals(only PT should resolve) Goal: Patient Will Transfer Sit To/From Stand Outcome: Progressing Flowsheets (Taken 02/12/2020 1252) Patient will transfer sit to/from stand: with minimal assist Goal: Pt Will Transfer Bed To Chair/Chair To Bed Outcome: Progressing Flowsheets (Taken 02/12/2020 1252) Pt will Transfer Bed to Chair/Chair to Bed: with min assist Goal: Pt Will Ambulate Outcome: Progressing Flowsheets (Taken 02/12/2020 1252) Pt will Ambulate:  50 feet  with minimal assist  with rolling walker   Tori Aaronjames Kelsay PT, DPT 02/12/20, 12:53 PM [phone]

## 2020-02-12 NOTE — TOC Initial Note (Signed)
Transition of Care [facility]) - Initial/Assessment Note    Patient Details  Name: [name] [name] MRN: [id number] Date of Birth: [birth year]  Transition of [facility]) CM/SW Contact:    Boneta Lucks, RN Phone Number: 02/12/2020, 2:24 PM  Clinical Narrative:  Patient admitted with acute bronchitis and renal failure. Patient has a high risk of readmission.  Patient lives with her daughter. Patient is alert and oriented. PT is recommending SNF, patient is agreeing. TOC can not reach her daughter. Patient states she wants to stay in [place] if possible. FL2 done and sent out. TOC to follow.                  Expected Discharge Plan: Skilled Nursing Facility Barriers to Discharge: Continued Medical Work up   Patient Goals and CMS Choice Patient states their goals for this hospitalization and ongoing recovery are:: to go to SNF for rehab then back home. CMS Medicare.gov Compare Post Acute Care list provided to:: Patient Choice offered to / list presented to : Patient  Expected Discharge Plan and Services Expected Discharge Plan: [place]         Prior Living Arrangements/Services   Lives with:: Adult Children          Need for Family Participation in Patient Care: Yes (Comment) Care giver support system in place?: Yes (comment)   Criminal Activity/Legal Involvement Pertinent to Current Situation/Hospitalization: No - Comment as needed  Activities of Daily Living Home Assistive Devices/Equipment: Cane (specify quad or straight), Wheelchair ADL Screening (condition at time of admission) Patient's cognitive ability adequate to safely complete daily activities?: Yes Is the patient deaf or have difficulty hearing?: No Does the patient have difficulty seeing, even when wearing glasses/contacts?: No Does the patient have difficulty concentrating, remembering, or making decisions?: Yes Patient able to express need for assistance with ADLs?: Yes Does the patient have  difficulty dressing or bathing?: Yes Independently performs ADLs?: No Communication: Independent Dressing (OT): Needs assistance Is this a change from baseline?: Pre-admission baseline Grooming: Needs assistance Is this a change from baseline?: Pre-admission baseline Feeding: Independent Is this a change from baseline?: Pre-admission baseline Bathing: Independent Toileting: Independent In/Out Bed: Needs assistance Is this a change from baseline?: Pre-admission baseline Walks in Home: Needs assistance Is this a change from baseline?: Pre-admission baseline Does the patient have difficulty walking or climbing stairs?: No Weakness of Legs: Both Weakness of Arms/Hands: Both  Permission Sought/Granted     Emotional Assessment      Orientation: : Oriented to Self, Oriented to Place, Oriented to  Time, Oriented to Situation Alcohol / Substance Use: Not Applicable Psych Involvement: No (comment)  Admission diagnosis:  Wheezing [R06.2] Hypoxia [R09.02] Acute hypoxemic respiratory failure ([place]) [J96.01] Community acquired pneumonia, unspecified laterality [J18.9] Patient Active Problem List   Diagnosis Date Noted  . Acute renal failure superimposed on stage 3b chronic kidney disease ([place]) 02/11/2020  . Acute hypoxemic respiratory failure ([place]) 01/15/2020  . Acute blood loss anemia 01/27/2019  . Acute on chronic renal failure ([place]) 01/25/2019  . Hypokalemia   . Leukocytosis   . Metabolic encephalopathy   . Chronic diastolic congestive heart failure ([place])   . Diabetes mellitus type 2 in obese ([place])   . Embolic stroke ([place]) 44/81/8563  . AKI (acute kidney injury) ([place]) 01/15/2019  . Drug toxicity 01/15/2019  . Intracranial carotid stenosis 01/15/2019  . 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 Severe aortic stenosis   . Rheumatoid arthritis ([place])   .  Hypothyroidism   . Diabetes mellitus, type 2 ([place])   . Chronic diastolic CHF (congestive heart failure)  ([place]) 08/18/2018  . CKD (chronic kidney disease) stage 3, GFR 30-59 ml/min 08/18/2018  . Acute exacerbation of chronic bronchitis ([place]) 08/18/2018  . Frequent falls 02/02/2018  . History of gastric ulcer 10/16/2016  . Dysphagia 10/16/2016  . Spondylolisthesis of lumbar region 08/03/2014  . Essential hypertension 08/02/2014  . Hyperlipidemia 08/02/2014  . Primary osteoarthritis of both knees 06/14/2014  . Right leg weakness 10/31/2013  . H/O adenomatous polyp of colon 08/17/2012  . Family history of colon cancer 08/17/2012  . Chronic nausea 08/17/2012   PCP:  Redmond School, MD Pharmacy:   [facility] [address], [place] [address] Phone: [phone] Fax: [place] [address] [place] [address] Phone: [phone] Fax: [phone]   Readmission Risk Interventions Readmission Risk Prevention Plan 02/12/2020  Transportation Screening Complete  PCP or Specialist Appt within 3-5 Days Not Complete  HRI or Home Care Consult Complete  Social Work Consult for [place] Planning/Counseling Complete  Palliative Care Screening Not Complete  Medication Review Press photographer) Complete  Some recent data might be hidden

## 2020-02-12 DEATH — deceased

## 2020-02-13 LAB — GLUCOSE, CAPILLARY
Glucose-Capillary: 193 mg/dL — ABNORMAL HIGH (ref 70–99)
Glucose-Capillary: 189 mg/dL — ABNORMAL HIGH (ref 70–99)
Glucose-Capillary: 200 mg/dL — ABNORMAL HIGH (ref 70–99)
Glucose-Capillary: 285 mg/dL — ABNORMAL HIGH (ref 70–99)

## 2020-02-13 LAB — RENAL FUNCTION PANEL
Albumin: 3.5 g/dL (ref 3.5–5.0)
Anion gap: 13 (ref 5–15)
BUN: 79 mg/dL — ABNORMAL HIGH (ref 8–23)
CO2: 20 mmol/L — ABNORMAL LOW (ref 22–32)
Calcium: 7.7 mg/dL — ABNORMAL LOW (ref 8.9–10.3)
Chloride: 95 mmol/L — ABNORMAL LOW (ref 98–111)
Creatinine, Ser: 3.05 mg/dL — ABNORMAL HIGH (ref 0.44–1.00)
GFR calc Af Amer: 17 mL/min — ABNORMAL LOW (ref >60)
GFR calc non Af Amer: 14 mL/min — ABNORMAL LOW (ref >60)
Glucose, Bld: 208 mg/dL — ABNORMAL HIGH (ref 70–99)
Phosphorus: 5.8 mg/dL — ABNORMAL HIGH (ref 2.5–4.6)
Potassium: 5 mmol/L (ref 3.5–5.1)
Sodium: 128 mmol/L — ABNORMAL LOW (ref 135–145)

## 2020-02-13 MED ORDER — GABAPENTIN 300 MG PO CAPS
300.0000 mg | ORAL_CAPSULE | Freq: Two times a day (BID) | ORAL | Status: DC
  Administered 2020-02-13 – 2020-02-15 (×4): 300 mg via ORAL
  Filled 2020-02-13 (×4): qty 1

## 2020-02-13 MED ORDER — ARFORMOTEROL TARTRATE 15 MCG/2ML IN NEBU
15.0000 ug | INHALATION_SOLUTION | Freq: Two times a day (BID) | RESPIRATORY_TRACT | Status: DC
  Administered 2020-02-13 – 2020-02-23 (×19): 15 ug via RESPIRATORY_TRACT
  Filled 2020-02-13 (×20): qty 2

## 2020-02-13 NOTE — Progress Notes (Signed)
Admit: 02/11/2020 LOS: 4  42F COPD exacacerabation and nonoliguric AoCKD3.  Subjective:  . No interval events, interactive but sleepy this morning . CK was normal, renal ultrasound limited but no obvious obstruction . UA with glucosuria and proteinuria; UPC was 3.47 . 0.9 L UOP yesterday. . Afebrile, blood pressures are stable, on 2 L, unchanged  03/01 0701 - 03/02 0700 In: -  Out: 900 [Urine:900]  Filed Weights   02/11/20 0423 02/12/20 0421 02/13/20 0500  Weight: 70.6 kg 72.7 kg 74.9 kg    Scheduled Meds: . amLODipine  10 mg Oral Daily  . aspirin EC  81 mg Oral Daily  . budesonide (PULMICORT) nebulizer solution  0.5 mg Nebulization BID  . gabapentin  400 mg Oral TID  . heparin  5,000 Units Subcutaneous Q8H  . insulin aspart  0-20 Units Subcutaneous TID WC  . insulin aspart  0-5 Units Subcutaneous QHS  . insulin aspart  14 Units Subcutaneous TID WC  . insulin glargine  25 Units Subcutaneous Daily  . ipratropium-albuterol  3 mL Nebulization Q6H WA  . levothyroxine  125 mcg Oral Daily  . mouth rinse  15 mL Mouth Rinse BID  . methylPREDNISolone (SOLU-MEDROL) injection  60 mg Intravenous Q6H  . multivitamin-lutein  1 capsule Oral Daily  . simvastatin  20 mg Oral Daily  . sodium chloride flush  3 mL Intravenous Q12H  . timolol  1 drop Both Eyes BID  . vitamin B-12  1,000 mcg Oral Daily   Continuous Infusions: . sodium chloride    . azithromycin 500 mg (02/12/20 1830)  . cefTRIAXone (ROCEPHIN)  IV 1 g (02/12/20 1835)   PRN Meds:.sodium chloride, acetaminophen **OR** acetaminophen, ALPRAZolam, benzonatate, bisacodyl, guaiFENesin-dextromethorphan, HYDROcodone-acetaminophen, ipratropium-albuterol, ondansetron **OR** ondansetron (ZOFRAN) IV, polyethylene glycol, sodium chloride flush, zolpidem  Current Labs: reviewed  CK WNL UA w/o WBC or Hb, 4+ protein UP/C 3.47   Physical Exam:  Blood pressure (!) 157/90, pulse 70, temperature 98.2 F (36.8 C), temperature source Oral,  resp. rate 19, height 4\' 9"  (1.448 m), weight 74.9 kg, SpO2 98 %.GEN: Chronically ill-appearing, somnolent NCAT EYES: EOMI CV: Regular, normal S1 and S2 PULM:  Present but less scattered expiratory wheezing, no crackles ABD: Soft, nontender SKIN: No rashes or lesions EXT: No peripheral edema  A 1. Nonoliguric AoCKD3; etiology unclear.    Renal ultrasound without obstruction.  UA without hematuria or pyuria; has borderline nephrotic range proteinuria; suspect her CKD is related to diabetic nephropathy.  I do not think there is another glomerular process at the current time.  Most likely has some mild ATN, suspect her AKI will resolve in time with supportive care 2. COPD exacerbation, per primary, corticosteroids, bronchodilators, ceftriaxone/azithromycin 3. Severe aortic stenosis status post TAVR 01/10/2019 4. History of CVA 5. DM 2 6. Hypertension 7. CHF 8. Frailty/debility, likely for SNF at discharge  P . Continue supportive care . We will follow along . No indication for dialysis at the current time . Medication Issues; o Preferred narcotic agents for pain control are hydromorphone, fentanyl, and methadone. Morphine should not be used.  o Baclofen should be avoided o Avoid oral sodium phosphate and magnesium citrate based laxatives / bowel preps    Pearson Grippe MD 02/13/2020, 10:12 AM  Recent Labs  Lab 02/11/20 0540 02/12/20 0416 02/13/20 0503  NA 132* 127* 128*  K 4.8 5.2* 5.0  CL 96* 93* 95*  CO2 26 22 20*  GLUCOSE 292* 260* 208*  BUN 43* 59* 79*  CREATININE  2.34* 2.75* 3.05*  CALCIUM 8.5* 7.7* 7.7*  PHOS  --   --  5.8*   Recent Labs  Lab 01/19/2020 1443 02/10/20 0224 02/11/20 0540  WBC 11.7* 11.2* 16.6*  NEUTROABS 8.4*  --   --   HGB 11.7* 11.3* 10.8*  HCT 36.0 35.1* 33.9*  MCV 88.2 87.8 89.2  PLT [phone]

## 2020-02-13 NOTE — Progress Notes (Signed)
OT Cancellation Note  Patient Details Name: [name] [name] MRN: [id number] DOB: 11/24/1946   Cancelled Treatment:    Reason Eval/Treat Not Completed: Patient declined, no reason specified. Pt reporting a restless night, slight audible wheezing during conversation. Pt reporting she is too tired to get up, declining to participate in ADLs or sit up in chair for breakfast. RT coming in to work with pt. Will check back at a later time when pt is agreeable to participation.    Guadelupe Sabin, OTR/L  [phone] 02/13/2020, 7:59 AM

## 2020-02-13 NOTE — Progress Notes (Signed)
Physical Therapy Treatment Patient Details Name: [name] [name] MRN: [id number] DOB: 1946-09-21 Today's Date: 02/13/2020    History of Present Illness 74 year old female with a history of diastolic CHF, COPD, CKD stage III, diabetes mellitus type 2, hypertension, severe aortic stenosis status post TAVR 3/78/5885, embolic stroke presenting with 3-day history of generalized weakness, coughing, shortness of breath, and wheezing.  The patient also had some unsteady gait and fell.  She did not lose consciousness.  The patient has not been able to use her nebulizer machine at home secondary to malfunctioning.  She denies any fevers, chills, chest pain, vomiting, diarrhea, abdominal pain.  Notably, the patient had a hospitalization from 01/10/2019 to 01/16/2019 where she underwent TAVR.  She had postoperative acute on chronic renal failure.  Her postoperative course was also complicated by bilateral lower extremity weakness and myoclonus.  She was seen by neurology at that time, and it was felt to be secondary to gabapentin toxicity in the setting of her acute on chronic renal failure.  MRI of the brain did show cardioembolic stroke.  CTA of the head and neck was negative for LVO.  But it did show advanced intracranial atherosclerosis with moderate to severe right and moderate left intracranial ICA stenosis.  The patient was subsequently discharged to CIR.    PT Comments    Patient presents slightly lethargic secondary to lack of sleep overnight per patient and agreeable for therapy.  Patient requires less assistance for sitting up at bedside, tends to lean/fall backwards when attempting marching in place and limited to a few steps at bedside due to c/o fatigue and mild SOB.  Patient tolerated sitting up in chair after therapy - nursing staff notified.  Patient will benefit from continued physical therapy in hospital and recommended venue below to increase strength, balance, endurance for safe ADLs and gait.     Follow Up Recommendations  SNF;Supervision/Assistance - 24 hour;Supervision for mobility/OOB     Equipment Recommendations  3in1 (PT)    Recommendations for Other Services       Precautions / Restrictions Precautions Precautions: Fall Restrictions Weight Bearing Restrictions: No    Mobility  Bed Mobility Overal bed mobility: Needs Assistance Bed Mobility: Supine to Sit     Supine to sit: Min guard;Min assist     General bed mobility comments: slow labored movement with HOB partially raised and required use of bed rail  Transfers Overall transfer level: Needs assistance Equipment used: Rolling walker (2 wheeled) Transfers: Sit to/from [facility] Sit to Stand: Min assist Stand pivot transfers: Min assist       General transfer comment: labored movement, limited tolerance for standing due to c/o fatigue  Ambulation/Gait Ambulation/Gait assistance: Mod assist Gait Distance (Feet): 4 Feet Assistive device: Rolling walker (2 wheeled) Gait Pattern/deviations: WFL(Within Functional Limits);Step-through pattern;Decreased step length - left Gait velocity: decreased   General Gait Details: limited to 4-5 short labored steps at bedside due to c/o fatigue and mild SOB   Stairs             Wheelchair Mobility    Modified Rankin (Stroke Patients Only)       Balance Overall balance assessment: Needs assistance Sitting-balance support: Feet supported;No upper extremity supported Sitting balance-Leahy Scale: Poor Sitting balance - Comments: fair/poor with occasional falling backwards when attempting dynamic activities Postural control: Posterior lean Standing balance support: During functional activity;Bilateral upper extremity supported Standing balance-Leahy Scale: Fair Standing balance comment: with RW  Cognition Arousal/Alertness: Awake/alert;Lethargic Behavior During Therapy: WFL for tasks  assessed/performed Overall Cognitive Status: Within Functional Limits for tasks assessed                                 General Comments: slightly lethargic keeping eyes closed most of time even while sitting up at bedside      Exercises General Exercises - Lower Extremity Ankle Circles/Pumps: Seated;AROM;Strengthening;Both;15 reps Long Arc Quad: Seated;AROM;Strengthening;Both;10 reps Hip Flexion/Marching: Seated;AROM;Strengthening;Both;10 reps    General Comments        Pertinent Vitals/Pain Pain Assessment: No/denies pain    Home Living                      Prior Function            PT Goals (current goals can now be found in the care plan section) Acute Rehab PT Goals Patient Stated Goal: return home with family to assist PT Goal Formulation: With patient Time For Goal Achievement: 02/26/20 Potential to Achieve Goals: Good Progress towards PT goals: Progressing toward goals    Frequency    Min 3X/week      PT Plan Current plan remains appropriate    Co-evaluation              AM-PAC PT "6 Clicks" Mobility   Outcome Measure  Help needed turning from your back to your side while in a flat bed without using bedrails?: A Little Help needed moving from lying on your back to sitting on the side of a flat bed without using bedrails?: A Little Help needed moving to and from a bed to a chair (including a wheelchair)?: A Lot Help needed standing up from a chair using your arms (e.g., wheelchair or bedside chair)?: A Lot Help needed to walk in hospital room?: A Lot Help needed climbing 3-5 steps with a railing? : A Lot 6 Click Score: 14    End of Session Equipment Utilized During Treatment: Oxygen Activity Tolerance: Patient tolerated treatment well;Patient limited by fatigue Patient left: in chair;with call bell/phone within reach Nurse Communication: Mobility status PT Visit Diagnosis: Unsteadiness on feet (R26.81);Other  abnormalities of gait and mobility (R26.89);Muscle weakness (generalized) (M62.81);History of falling (Z91.81)     Time: 9244-6286 PT Time Calculation (min) (ACUTE ONLY): 23 min  Charges:  $Therapeutic Exercise: 8-22 mins $Therapeutic Activity: 8-22 mins                     12:21 PM, 02/13/20 Lonell Grandchild, MPT Physical Therapist with [facility] [phone] office [phone] mobile phone

## 2020-02-13 NOTE — Progress Notes (Signed)
PROGRESS NOTE  [name] [name]:[id number] DOB: July 01, 1946 DOA: 01/17/2020 PCP: [employer], MD   Brief History: 74 year old female with a history of diastolic CHF, COPD, CKD stage III, diabetes mellitus type 2, hypertension, severe aortic stenosis status post TAVR 08/29/3845, embolic stroke presenting with 3-day history of generalized weakness, coughing, shortness of breath, and wheezing. The patient also had some unsteady gait and fell. She did not lose consciousness. The patient has not been able to use her nebulizer machine at home secondary to malfunctioning. She denies any fevers, chills, chest pain, vomiting, diarrhea, abdominal pain. Notably, the patient had a hospitalization from 01/10/2019 to2/02/2019 where she underwent TAVR. She had postoperative acute on chronic renal failure. Her postoperative course was also complicated by bilateral lower extremity weakness and myoclonus.She was seen by neurology at that time, and it was felt to be secondary to gabapentin toxicity in the setting of her acute on chronic renal failure. MRI of the brain did show cardioembolic stroke. CTA of the head and neck was negative for LVO. But it did show advanced intracranial atherosclerosis with moderate to severe right and moderate left intracranial ICA stenosis. The patient was subsequently discharged to [facility].  In the emergency department, the patient was afebrile hemodynamically stable with oxygen saturation 95% room air. WBC 11.7. Serum creatinine was 2.01. Chest x-ray showed bilateral interstitial opacities. The patient was started on Solu-Medrol for COPD exacerbation.  Assessment/Plan: Acute Respiratory Failure with hypoxia -due to PNA and COPD exacerbation -stable on 2L -wean oxygen for saturation <>92%  COPD exacerbation -ContinuePulmicort -Continue IV Solu-Medrol -Continue duo nebs -add brovana  R-lungopacity/CAP -Continue empiric ceftriaxone and  azithromycin -Procalcitonin<0.10 -CT chest--Patchy opacities at the right lung apex with surrounding ground-glass and peripheral predominance; bronchomalacia; circumferential esophageal thickening  Acute on chronic renal failure CKD stage IIIb -Baseline creatinine 1.5-1.6 -Suspect the patient may have a degree of progression of her underlying renal disease -serum creatinine continues to rise -nephrology consult--discussed with Dr. Joelyn Oms -holding diuretics  -renal US--neg hydronephrosis  Chronic diastolic CHF -Daily weights -appears clinically euvolemic -2/27 echo--EF 55-60%, no WMA, G1DD, mild MR, s/pTAVR  Uncontrolled diabetes mellitus type 2 with hyperglycemia -2/27/21Hemoglobin A1c--6.6 -increaseLantus 25 units daily -NovoLog sliding scale -IncreaseNovoLog14units with meals -Holding glipizide, Jardiance, Metformin -elevated cbg due to steroids  Hypertension -Continue amlodipine -controlled  Hypothyroidism -TSH 32.53 -Increase Synthroid  Hyperlipidemia -Continue statin  Frequent falls -PT evaluation-->SNF -orthostatic vital signs neg  History of stroke -Continue aspirin  History of TAVR -Underwent TAVR 01/10/2019 -finished 6 months DAPT         Disposition Plan: Patient From: Home D/C Place: SNF -2-3Days Barriers: Not Clinically Stable--remains on IV abx and IV steroids.  Renal function worsening  Family Communication:Daughter updated on 3/2  Consultants:none  Code Status: FULL   DVT Prophylaxis: [place] Heparin   Procedures: As Listed in Progress Note Above  Antibiotics: Ceftriaxone 2/26>>> azithro 2/26>>>     Subjective: Pt remains dyspneic with exertion.  Denies f/c cp, n/v/d, abd pain.  No dysuria  Objective: Vitals:   02/13/20 0143 02/13/20 0500 02/13/20 0542 02/13/20 0757  BP:   (!) 157/90   Pulse:   70   Resp:   19   Temp:   98.2 F (36.8 C)   TempSrc:   Oral   SpO2: 93%  97% 98%   Weight:  74.9 kg    Height:        Intake/Output Summary (Last 24 hours) at  02/13/2020 1320 Last data filed at 02/13/2020 1023 Gross per 24 hour  Intake 318 ml  Output 1400 ml  Net -1082 ml   Weight change: 2.2 kg Exam:   General:  Pt is alert, follows commands appropriately, not in acute distress  HEENT: No icterus, No thrush, No neck mass, [place]/AT  Cardiovascular: RRR, S1/S2, no rubs, no gallops  Respiratory: bilateral exp wheeze.  Bilateral rales  Abdomen: Soft/+BS, non tender, non distended, no guarding  Extremities: Non pitting edema, No lymphangitis, No petechiae, No rashes, no synovitis   Data Reviewed: I have personally reviewed following labs and imaging studies Basic Metabolic Panel: Recent Labs  Lab 01/17/2020 1443 02/10/20 0224 02/10/20 0918 02/11/20 0540 02/12/20 0416 02/13/20 0503  NA 138 135  --  132* 127* 128*  K 3.2* 3.8  --  4.8 5.2* 5.0  CL 99 97*  --  96* 93* 95*  CO2 27 24  --  26 22 20*  GLUCOSE 134* 458*  --  292* 260* 208*  BUN 23 25*  --  43* 59* 79*  CREATININE 2.01* 1.99*  --  2.34* 2.75* 3.05*  CALCIUM 8.9 8.5*  --  8.5* 7.7* 7.7*  MG  --   --  2.5* 2.6*  --   --   PHOS  --   --   --   --   --  5.8*   Liver Function Tests: Recent Labs  Lab 02/13/20 0503  ALBUMIN 3.5   No results for input(s): LIPASE, AMYLASE in the last 168 hours. No results for input(s): AMMONIA in the last 168 hours. Coagulation Profile: No results for input(s): INR, PROTIME in the last 168 hours. CBC: Recent Labs  Lab 01/30/2020 1443 02/10/20 0224 02/11/20 0540  WBC 11.7* 11.2* 16.6*  NEUTROABS 8.4*  --   --   HGB 11.7* 11.3* 10.8*  HCT 36.0 35.1* 33.9*  MCV 88.2 87.8 89.2  PLT 366 370 378   Cardiac Enzymes: Recent Labs  Lab 02/12/20 1354  CKTOTAL 31*   BNP: Invalid input(s): POCBNP CBG: Recent Labs  Lab 02/12/20 1102 02/12/20 1610 02/12/20 2130 02/13/20 0719 02/13/20 1152  GLUCAP 253* 206* 224* 193* 189*   HbA1C: No results for  input(s): HGBA1C in the last 72 hours. Urine analysis:    Component Value Date/Time   COLORURINE YELLOW 02/12/2020 1719   APPEARANCEUR CLEAR 02/12/2020 1719   LABSPEC 1.011 02/12/2020 1719   PHURINE 5.0 02/12/2020 1719   GLUCOSEU >=500 (A) 02/12/2020 1719   HGBUR NEGATIVE 02/12/2020 1719   BILIRUBINUR NEGATIVE 02/12/2020 1719   KETONESUR NEGATIVE 02/12/2020 1719   PROTEINUR >=300 (A) 02/12/2020 1719   UROBILINOGEN 0.2 09/23/2013 1926   NITRITE NEGATIVE 02/12/2020 1719   LEUKOCYTESUR NEGATIVE 02/12/2020 1719   Sepsis Labs: @LABRCNTIP (procalcitonin:4,lacticidven:4) ) Recent Results (from the past 240 hour(s))  SARS CORONAVIRUS 2 ([place] 6-24 HRS) Nasopharyngeal Nasopharyngeal Swab     Status: None   Collection Time: 01/19/2020  7:58 PM   Specimen: Nasopharyngeal Swab  Result Value Ref Range Status   SARS Coronavirus 2 NEGATIVE NEGATIVE Final    Comment: (NOTE) SARS-CoV-2 target nucleic acids are NOT DETECTED. The SARS-CoV-2 RNA is generally detectable in upper and lower respiratory specimens during the acute phase of infection. Negative results do not preclude SARS-CoV-2 infection, do not rule out co-infections with other pathogens, and should not be used as the sole basis for treatment or other patient management decisions. Negative results must be combined with clinical observations, patient history, and epidemiological  information. The expected result is Negative. Fact Sheet for Patients: SugarRoll.be Fact Sheet for Healthcare Providers: https://www.woods-mathews.com/ This test is not yet approved or cleared by the [place] FDA and  has been authorized for detection and/or diagnosis of SARS-CoV-2 by FDA under an Emergency Use Authorization (EUA). This EUA will remain  in effect (meaning this test can be used) for the duration of the COVID-19 declaration under Section 56 4(b)(1) of the Act, 21 U.S.C. section 360bbb-3(b)(1), unless  the authorization is terminated or revoked sooner. Performed at [facility], [place] [address] [place], [place] [address]   Respiratory Panel by PCR     Status: Abnormal   Collection Time: 02/11/20  6:10 PM   Specimen: Nasopharyngeal Swab; Respiratory  Result Value Ref Range Status   Adenovirus NOT DETECTED NOT DETECTED Final   Coronavirus 229E NOT DETECTED NOT DETECTED Final    Comment: (NOTE) The Coronavirus on the Respiratory Panel, DOES NOT test for the novel  Coronavirus (2019 nCoV)    Coronavirus HKU1 NOT DETECTED NOT DETECTED Final   Coronavirus NL63 NOT DETECTED NOT DETECTED Final   Coronavirus OC43 NOT DETECTED NOT DETECTED Final   Metapneumovirus NOT DETECTED NOT DETECTED Final   Rhinovirus / Enterovirus DETECTED (A) NOT DETECTED Final   Influenza A NOT DETECTED NOT DETECTED Final   Influenza B NOT DETECTED NOT DETECTED Final   Parainfluenza Virus 1 NOT DETECTED NOT DETECTED Final   Parainfluenza Virus 2 NOT DETECTED NOT DETECTED Final   Parainfluenza Virus 3 NOT DETECTED NOT DETECTED Final   Parainfluenza Virus 4 NOT DETECTED NOT DETECTED Final   Respiratory Syncytial Virus NOT DETECTED NOT DETECTED Final   Bordetella pertussis NOT DETECTED NOT DETECTED Final   Chlamydophila pneumoniae NOT DETECTED NOT DETECTED Final   Mycoplasma pneumoniae NOT DETECTED NOT DETECTED Final    Comment: Performed at [facility], [place]. [address] [place], [place] [address]  Expectorated sputum assessment w rflx to resp cult     Status: None (Preliminary result)   Collection Time: 02/12/20  2:27 PM   Specimen: Sputum  Result Value Ref Range Status   Specimen Description SPUTUM  Final   Special Requests NONE  Final   Sputum evaluation   Final    THIS SPECIMEN IS ACCEPTABLE FOR SPUTUM CULTURE Performed at [facility] Performed at [facility], [address] [place], [place] [address]    Report Status PENDING  Incomplete  Culture, respiratory     Status: None  (Preliminary result)   Collection Time: 02/12/20  2:27 PM   Specimen: SPU  Result Value Ref Range Status   Specimen Description   Final    SPUTUM Performed at [facility], [address] [place], [place] [address]    Special Requests   Final    NONE Reflexed from [id number] Performed at [facility], [address], [place], [place] [address]    Gram Stain NO WBC SEEN RARE GRAM POSITIVE COCCI   Final   Culture   Final    TOO YOUNG TO READ Performed at [facility], [place] [address] [place],  [address]    Report Status PENDING  Incomplete     Scheduled Meds: . amLODipine  10 mg Oral Daily  . aspirin EC  81 mg Oral Daily  . budesonide (PULMICORT) nebulizer solution  0.5 mg Nebulization BID  . gabapentin  400 mg Oral TID  . heparin  5,000 Units Subcutaneous Q8H  . insulin aspart  0-20 Units Subcutaneous TID WC  . insulin aspart  0-5 Units Subcutaneous QHS  . insulin aspart  14 Units Subcutaneous TID WC  . insulin glargine  25 Units Subcutaneous Daily  . ipratropium-albuterol  3 mL Nebulization Q6H WA  . levothyroxine  125 mcg Oral Daily  . mouth rinse  15 mL Mouth Rinse BID  . methylPREDNISolone (SOLU-MEDROL) injection  60 mg Intravenous Q6H  . multivitamin-lutein  1 capsule Oral Daily  . simvastatin  20 mg Oral Daily  . sodium chloride flush  3 mL Intravenous Q12H  . timolol  1 drop Both Eyes BID  . vitamin B-12  1,000 mcg Oral Daily   Continuous Infusions: . sodium chloride    . azithromycin 500 mg (02/12/20 1830)  . cefTRIAXone (ROCEPHIN)  IV 1 g (02/12/20 1835)    Procedures/Studies: CT CHEST WO CONTRAST  Result Date: 02/10/2020 CLINICAL DATA:  Cough, persistent COPD exacerbation. Chest x-ray with questionable abnormalities. EXAM: CT CHEST WITHOUT CONTRAST TECHNIQUE: Multidetector CT imaging of the chest was performed following the standard protocol without IV contrast. COMPARISON:  Chest x-ray of 01/21/2020 and CT a chest abdomen and pelvis from 08/18/2018  FINDINGS: Cardiovascular: Calcified atherosclerotic changes and three-vessel coronary artery disease with evidence of prior trans arterial aortic valve replacement. Heart size mildly enlarged without pericardial effusion. Limited assessment of vascular structures due to lack of intravenous contrast. Central pulmonary vasculature mildly dilated at 2.7 cm. Mediastinum/Nodes: Mildly enlarged AP window lymph node 11 mm not changed since 2019. Scattered small lymph nodes throughout the chest none with pathologic enlargement. Hilar structures not well evaluated. No thoracic inlet adenopathy. No thoracic inlet adenopathy. Esophagus with mild circumferential thickening distally. Lungs/Pleura: Patchy opacities at the right lung apex with surrounding ground-glass and peripheral predominance. Some of these areas show small central solid-appearing nodules, largest measuring 4 mm (image 55, series 5) process appears confined to the right upper lobe. Airways are patent. Some distortion of the right hilum and mild narrowing of the bronchus intermedius is unchanged and may reflect mild bronchomalacia. Scattered areas of scarring in the lingula are stable. Upper Abdomen: Incidental imaging of upper abdominal contents showing no signs of acute abnormality. Musculoskeletal: No acute bone finding or destructive bone process. IMPRESSION: 1. Patchy opacities at the right lung apex with surrounding ground-glass and peripheral predominance. Process appears confined to mainly to the right upper lobe. This would be atypical for metastases. Infection with viral or atypical process is considered. Correlation with blood cultures may be helpful to exclude hematogenous process in this patient with aortic valve replacement. 2. Stable mildly enlarged AP window lymph node. Attention on follow-up 3. Mild circumferential thickening of the distal esophagus may reflect esophagitis. 4. Calcified atherosclerotic changes and three-vessel coronary artery  disease with evidence of prior trans arterial aortic valve replacement. Aortic Atherosclerosis (ICD10-I70.0). Electronically Signed   By: Zetta Bills M.D.   On: 02/10/2020 17:58   US RENAL  Result Date: 02/12/2020 CLINICAL DATA:  Acute renal failure superimposed upon chronic kidney disease, diabetes mellitus, hypertension, hyperlipidemia, horseshoe kidney EXAM: RENAL / URINARY TRACT ULTRASOUND COMPLETE COMPARISON:  CT abdomen 08/18/2018 FINDINGS: Right Kidney: Renal measurements: 14.4 x 4.9 x 6.7 cm = volume: 250 mL. Extremely poorly visualized due to cortical thinning, increased cortical echogenicity, and body habitus. No gross evidence of mass or hydronephrosis are identified on limited assessment. Left Kidney: Renal measurements: 10.0 x 4.1 x 4.9 cm = volume: 106 mL. Extremely poorly visualized due to cortical thinning, increased cortical echogenicity, and  body habitus. No gross evidence of mass or hydronephrosis on limited assessment. Bladder: Decompressed by catheter, unable to evaluate Other: N/A IMPRESSION: Severe limitations of exam secondary to body habitus, bowel gas, and horseshoe anatomy. There appears to be cortical thinning and increased cortical echogenicity of the renal moieties bilaterally without gross evidence of mass or hydronephrosis on limited assessment. Electronically Signed   By: Lavonia Dana M.D.   On: 02/12/2020 14:59   DG Chest Portable 1 View  Result Date: 01/31/2020 CLINICAL DATA:  Wheezing EXAM: PORTABLE CHEST 1 VIEW COMPARISON:  08/29/2019 FINDINGS: Stable mild cardiomegaly status post TAVR. Calcific aortic knob. Bilateral interstitial opacities, right worse than left with areas of subtle nodularity most prominent in the right upper lobe. No pleural effusion or pneumothorax. IMPRESSION: Bilateral interstitial opacities, right worse than left with areas of subtle nodularity most prominent in the right upper lobe. Findings could reflect atypical or viral pneumonia. Radiographic  follow-up to resolution is recommended. Electronically Signed   By: Davina Poke D.O.   On: 02/08/2020 14:46   ECHOCARDIOGRAM COMPLETE  Result Date: 02/10/2020    ECHOCARDIOGRAM REPORT   Patient Name:   [name] [name] Date of Exam: 02/10/2020 Medical Rec #:  [id number]         Height:       57.0 in Accession #:    [id number]        Weight:       150.4 lb Date of Birth:  January 23, 1946         BSA:          1.593 m Patient Age:    66 years          BP:           128/61 mmHg Patient Gender: F                 HR:           75 bpm. Exam Location:  Inpatient Procedure: 2D Echo and Strain Analysis Indications:    Aortic Stenosis 424.1 / 135.0  History:        Patient has prior history of Echocardiogram examinations, most                 recent 01/11/2019. Stroke, Signs/Symptoms:Shortness of Breath;                 Risk Factors:Hypertension, Diabetes and Dyslipidemia. Pneumonia.                 Chronic kidney disease. Bilateral carotid stenosis.                 Aortic Valve: 23 mm Edwards Sapien prosthetic, stented (TAVR)                 valve is present in the aortic position. Procedure Date:                 01/10/2019.  Sonographer:    Darlina Sicilian RDCS Referring Phys: [id number] [place]  1. Left ventricular ejection fraction, by estimation, is 55 to 60%. The left ventricle has normal function. The left ventricle has no regional wall motion abnormalities. There is moderate concentric left ventricular hypertrophy. Left ventricular diastolic parameters are consistent with Grade I diastolic dysfunction (impaired relaxation). Elevated left ventricular end-diastolic pressure.  2. Right ventricular systolic function is normal. The right ventricular size is normal.  3. The mitral valve is normal in structure and function. Mild mitral annular calcification.  Mild mitral valve regurgitation. No evidence of mitral stenosis.  4. The aortic valve has been repaired/replaced. There is trivial perivalvular AI  ipresent. No aortic stenosis is present. There is a 23 mm Edwards Sapien prosthetic (TAVR) valve present in the aortic position. that is funcitoning normaly. Aortic valve mean gradient measures 12.0 mmHg. Aortic valve peak gradient measures 21.8 mmHg. Aortic valve area, by VTI measures 1.66 cm. Dimensionless index is 0.44 and the AVA is calculated to be 1.51cm2.  5. The inferior vena cava is normal in size with greater than 50% respiratory variability, suggesting right atrial pressure of 3 mmHg.  6. Compared to prior echo, the mean TAVR gradient has increased from 11 to 38mmhg and AVG is 1.51cm2 (was 1.88cm2 on last echo). FINDINGS  Left Ventricle: Left ventricular ejection fraction, by estimation, is 55 to 60%. The left ventricle has normal function. The left ventricle has no regional wall motion abnormalities. The left ventricular internal cavity size was normal in size. There is  moderate concentric left ventricular hypertrophy. Left ventricular diastolic parameters are consistent with Grade I diastolic dysfunction (impaired relaxation). Elevated left ventricular end-diastolic pressure. Right Ventricle: The right ventricular size is normal. No increase in right ventricular wall thickness. Right ventricular systolic function is normal. Left Atrium: Left atrial size was normal in size. Right Atrium: Right atrial size was normal in size. Pericardium: Trivial pericardial effusion is present. The pericardial effusion is posterior to the left ventricle. Mitral Valve: The mitral valve is normal in structure and function. Normal mobility of the mitral valve leaflets. Mild mitral annular calcification. Mild mitral valve regurgitation. No evidence of mitral valve stenosis. Tricuspid Valve: The tricuspid valve is normal in structure. Tricuspid valve regurgitation is not demonstrated. No evidence of tricuspid stenosis. Aortic Valve: The aortic valve has been repaired/replaced. Aortic valve regurgitation trivial perivalvular  AI is present. No aortic stenosis is present. Aortic valve mean gradient measures 12.0 mmHg. Aortic valve peak gradient measures 21.8 mmHg. Aortic valve area, by VTI measures 1.66 cm. There is a 23 mm Edwards Sapien prosthetic, stented (TAVR) valve present in the aortic position. Procedure Date: 01/10/2019. Pulmonic Valve: The pulmonic valve was normal in structure. Pulmonic valve regurgitation is not visualized. No evidence of pulmonic stenosis. Aorta: The aortic root is normal in size and structure. Venous: The inferior vena cava is normal in size with greater than 50% respiratory variability, suggesting right atrial pressure of 3 mmHg. IAS/Shunts: No atrial level shunt detected by color flow Doppler.  LEFT VENTRICLE PLAX 2D LVIDd:         4.79 cm      Diastology LVIDs:         3.43 cm      LV e' lateral:   4.35 cm/s LV PW:         1.22 cm      LV E/e' lateral: 29.7 LV IVS:        1.34 cm      LV e' medial:    3.00 cm/s LVOT diam:     2.20 cm      LV E/e' medial:  43.0 LV SV:         82 LV SV Index:   52 LVOT Area:     3.80 cm  LV Volumes (MOD) LV vol d, MOD A2C: 99.5 ml LV vol d, MOD A4C: 125.0 ml LV vol s, MOD A2C: 50.2 ml LV vol s, MOD A4C: 56.4 ml LV SV MOD A2C:     49.3  ml LV SV MOD A4C:     125.0 ml LV SV MOD BP:      58.3 ml RIGHT VENTRICLE RV S prime:     11.20 cm/s TAPSE (M-mode): 1.6 cm LEFT ATRIUM             Index       RIGHT ATRIUM           Index LA diam:        3.60 cm 2.26 cm/m  RA Area:     15.20 cm LA Vol (A2C):   55.7 ml 34.96 ml/m RA Volume:   34.70 ml  21.78 ml/m LA Vol (A4C):   49.6 ml 31.13 ml/m LA Biplane Vol: 53.2 ml 33.39 ml/m  AORTIC VALVE AV Area (Vmax):    1.51 cm AV Area (Vmean):   1.61 cm AV Area (VTI):     1.66 cm AV Vmax:           233.50 cm/s AV Vmean:          162.000 cm/s AV VTI:            0.496 m AV Peak Grad:      21.8 mmHg AV Mean Grad:      12.0 mmHg LVOT Vmax:         92.50 cm/s LVOT Vmean:        68.400 cm/s LVOT VTI:          0.217 m LVOT/AV VTI ratio: 0.44   AORTA Ao Root diam: 3.10 cm Ao Asc diam:  3.00 cm MITRAL VALVE MV Area (PHT): 3.21 cm     SHUNTS MV Decel Time: 236 msec     Systemic VTI:  0.22 m MV E velocity: 129.00 cm/s  Systemic Diam: 2.20 cm MV A velocity: 144.00 cm/s MV E/A ratio:  0.90 Fransico Him MD Electronically signed by Fransico Him MD Signature Date/Time: 02/10/2020/5:45:00 PM    Final     Orson Eva, DO  [facility]  If 7PM-7AM, please contact night-coverage [email or web address] Password [email or web address] 02/13/2020, 1:20 PM   LOS: 4 days

## 2020-02-14 DIAGNOSIS — E782 Mixed hyperlipidemia: Secondary | ICD-10-CM

## 2020-02-14 LAB — GLUCOSE, CAPILLARY
Glucose-Capillary: 214 mg/dL — ABNORMAL HIGH (ref 70–99)
Glucose-Capillary: 181 mg/dL — ABNORMAL HIGH (ref 70–99)
Glucose-Capillary: 217 mg/dL — ABNORMAL HIGH (ref 70–99)
Glucose-Capillary: 142 mg/dL — ABNORMAL HIGH (ref 70–99)
Glucose-Capillary: 226 mg/dL — ABNORMAL HIGH (ref 70–99)

## 2020-02-14 LAB — RENAL FUNCTION PANEL
Albumin: 3.4 g/dL — ABNORMAL LOW (ref 3.5–5.0)
Anion gap: 12 (ref 5–15)
BUN: 93 mg/dL — ABNORMAL HIGH (ref 8–23)
CO2: 21 mmol/L — ABNORMAL LOW (ref 22–32)
Calcium: 7.5 mg/dL — ABNORMAL LOW (ref 8.9–10.3)
Chloride: 98 mmol/L (ref 98–111)
Creatinine, Ser: 3.56 mg/dL — ABNORMAL HIGH (ref 0.44–1.00)
GFR calc Af Amer: 14 mL/min — ABNORMAL LOW (ref >60)
GFR calc non Af Amer: 12 mL/min — ABNORMAL LOW (ref >60)
Glucose, Bld: 217 mg/dL — ABNORMAL HIGH (ref 70–99)
Phosphorus: 6.3 mg/dL — ABNORMAL HIGH (ref 2.5–4.6)
Potassium: 5 mmol/L (ref 3.5–5.1)
Sodium: 131 mmol/L — ABNORMAL LOW (ref 135–145)

## 2020-02-14 LAB — URINALYSIS, COMPLETE (UACMP) WITH MICROSCOPIC
Bacteria, UA: NONE SEEN
Bilirubin Urine: NEGATIVE
Glucose, UA: 150 mg/dL — AB
Hgb urine dipstick: NEGATIVE
Ketones, ur: NEGATIVE mg/dL
Leukocytes,Ua: NEGATIVE
Nitrite: NEGATIVE
Protein, ur: 100 mg/dL — AB
Specific Gravity, Urine: 1.011 (ref 1.005–1.030)
pH: 6 (ref 5.0–8.0)

## 2020-02-14 LAB — CULTURE, RESPIRATORY W GRAM STAIN
Culture: NORMAL
Gram Stain: NONE SEEN

## 2020-02-14 LAB — SODIUM, URINE, RANDOM: Sodium, Ur: <10 mmol/L

## 2020-02-14 LAB — CREATININE, URINE, RANDOM: Creatinine, Urine: 58.66 mg/dL

## 2020-02-14 LAB — SARS CORONAVIRUS 2 (TAT 6-24 HRS): SARS Coronavirus 2: NEGATIVE

## 2020-02-14 MED ORDER — INSULIN GLARGINE 100 UNIT/ML ~~LOC~~ SOLN
35.0000 [IU] | Freq: Every day | SUBCUTANEOUS | Status: DC
  Administered 2020-02-14 – 2020-02-15 (×2): 35 [IU] via SUBCUTANEOUS
  Filled 2020-02-14 (×3): qty 0.35

## 2020-02-14 NOTE — Care Management Important Message (Signed)
Important Message  Patient Details  Name: [name] [name] MRN: [id number] Date of Birth: 08/19/1946   Medicare Important Message Given:  Yes     Tommy Medal 02/14/2020, 1:46 PM

## 2020-02-14 NOTE — Evaluation (Signed)
Occupational Therapy Evaluation Patient Details Name: [name] [name] MRN: [id number] DOB: [birth year] Today's Date: 02/14/2020    History of Present Illness 74 year old female with a history of diastolic CHF, COPD, CKD stage III, diabetes mellitus type 2, hypertension, severe aortic stenosis status post TAVR 6/71/2458, embolic stroke presenting with 3-day history of generalized weakness, coughing, shortness of breath, and wheezing.  The patient also had some unsteady gait and fell.  She did not lose consciousness.  The patient has not been able to use her nebulizer machine at home secondary to malfunctioning.  She denies any fevers, chills, chest pain, vomiting, diarrhea, abdominal pain.  Notably, the patient had a hospitalization from 01/10/2019 to 01/16/2019 where she underwent TAVR.  She had postoperative acute on chronic renal failure.  Her postoperative course was also complicated by bilateral lower extremity weakness and myoclonus.  She was seen by neurology at that time, and it was felt to be secondary to gabapentin toxicity in the setting of her acute on chronic renal failure.  MRI of the brain did show cardioembolic stroke.  CTA of the head and neck was negative for LVO.  But it did show advanced intracranial atherosclerosis with moderate to severe right and moderate left intracranial ICA stenosis.  The patient was subsequently discharged to [facility].   Clinical Impression   Pt agreeable to OT evaluation this morning. Lethargy and fatigue limiting evaluation, somewhat confused during session, often asking the same questions or repeating herself. Pt requiring min/mod assist for mobility and was unable to keep eyes open while at EOB. Pt declined ADLs with exception of donning socks. Required assistance for returning to bed due to lethargy. Recommend SNF on discharge to improve strength, safety and independence in ADL completion.     Follow Up Recommendations  SNF;Supervision/Assistance - 24 hour     Equipment Recommendations  None recommended by OT       Precautions / Restrictions Precautions Precautions: Fall Restrictions Weight Bearing Restrictions: No      Mobility Bed Mobility Overal bed mobility: Needs Assistance Bed Mobility: Supine to Sit;Sit to Supine     Supine to sit: Min assist Sit to supine: Mod assist   General bed mobility comments: slow labored movement with HOB partially raised and required use of bed rail  Transfers Overall transfer level: Needs assistance Equipment used: Rolling walker (2 wheeled) Transfers: Sit to/from Stand Sit to Stand: Mod assist         General transfer comment: labored movement, limited tolerance for standing due to c/o fatigue        ADL either performed or assessed with clinical judgement   ADL Overall ADL's : Needs assistance/impaired                     Lower Body Dressing: Maximal assistance;Sitting/lateral leans Lower Body Dressing Details (indicate cue type and reason): pt unable to donn socks while seated at EOB due to lethargy and poor balance when bending forward               General ADL Comments: Pt unwilling to participate in additional ADLs during evaluation.      Vision Baseline Vision/History: No visual deficits Patient Visual Report: No change from baseline Vision Assessment?: No apparent visual deficits            Pertinent Vitals/Pain Pain Assessment: No/denies pain     Hand Dominance Right   Extremity/Trunk Assessment Upper Extremity Assessment Upper Extremity Assessment: Generalized weakness   Lower Extremity Assessment  Lower Extremity Assessment: Defer to PT evaluation   Cervical / Trunk Assessment Cervical / Trunk Assessment: Normal   Communication Communication Communication: No difficulties   Cognition Arousal/Alertness: Lethargic Behavior During Therapy: WFL for tasks assessed/performed Overall Cognitive Status: Within Functional Limits for tasks assessed                                  General Comments: slightly lethargic keeping eyes closed most of time even while sitting up at bedside              Home Living Family/patient expects to be discharged to:: Private residence Living Arrangements: Children;Other relatives(2 daughters, 2 granddaughters) Available Help at Discharge: Family;Available 24 hours/day Type of Home: House Home Access: Stairs to enter [employer] of Steps: 1 Entrance Stairs-Rails: Right Home Layout: One level     Bathroom Shower/Tub: Occupational psychologist: Standard     Home Equipment: Environmental consultant - 2 wheels;Cane - single point;Shower seat - built in;Wheelchair - manual          Prior Functioning/Environment Level of Independence: Independent with assistive device(s)        Comments: Pt reports independent with bathing, dressing, ambulating community distances using RW or pushing shopping cart, and getting in/out of bed and chairs. Pt reports family completes cooking, cleaning, driving. Pt reports daily falls due to low blood sugar or BLE buckling.        OT Problem List: Decreased strength;Decreased activity tolerance;Impaired balance (sitting and/or standing);Decreased safety awareness;Decreased knowledge of use of DME or AE       End of Session Equipment Utilized During Treatment: Gait belt;Rolling walker Nurse Communication: Mobility status;Other (comment)(right elbow needs redressing)  Activity Tolerance: Patient tolerated treatment well Patient left: in bed;with call bell/phone within reach;with bed alarm set  OT Visit Diagnosis: Unsteadiness on feet (R26.81);Muscle weakness (generalized) (M62.81);Repeated falls (R29.6)                Time: 4696-2952 OT Time Calculation (min): 20 min Charges:  OT General Charges $OT Visit: 1 Visit OT Evaluation $OT Eval Low Complexity: [place], OTR/L  [phone] 02/14/2020, 8:55 AM

## 2020-02-14 NOTE — Progress Notes (Signed)
PROGRESS NOTE  [name] [name] ZOX:[id number] DOB: June 10, 1946 DOA: 01/22/2020 PCP: [employer], MD   Brief History: 74 year old female with a history of diastolic CHF, COPD, CKD stage III, diabetes mellitus type 2, hypertension, severe aortic stenosis status post TAVR 07/24/9146, embolic stroke presenting with 3-day history of generalized weakness, coughing, shortness of breath, and wheezing. The patient also had some unsteady gait and fell. She did not lose consciousness. The patient has not been able to use her nebulizer machine at home secondary to malfunctioning. She denies any fevers, chills, chest pain, vomiting, diarrhea, abdominal pain. Notably, the patient had a hospitalization from 01/10/2019 to2/02/2019 where she underwent TAVR. She had postoperative acute on chronic renal failure. Her postoperative course was also complicated by bilateral lower extremity weakness and myoclonus.She was seen by neurology at that time, and it was felt to be secondary to gabapentin toxicity in the setting of her acute on chronic renal failure. MRI of the brain did show cardioembolic stroke. CTA of the head and neck was negative for LVO. But it did show advanced intracranial atherosclerosis with moderate to severe right and moderate left intracranial ICA stenosis. The patient was subsequently discharged to [facility].  In the emergency department, the patient was afebrile hemodynamically stable with oxygen saturation 95% room air. WBC 11.7. Serum creatinine was 2.01. Chest x-ray showed bilateral interstitial opacities. The patient was started on Solu-Medrol for COPD exacerbation.  Assessment/Plan: Acute Respiratory Failure with hypoxia -due to PNA and COPD exacerbation -stable on 2L -wean oxygen for saturation <>92%  COPD exacerbation -ContinuePulmicort -Continue IV Solu-Medrol -Continue duo nebs -added brovana  R-lungopacity/CAP -Continue empiric ceftriaxone and  azithromycin -Procalcitonin<0.10 -CT chest--Patchy opacities at the right lung apex with surrounding ground-glass and peripheral predominance; bronchomalacia; circumferential esophageal thickening  Acute on chronic renal failure CKD stage IIIb -Baseline creatinine 1.5-1.6 -Suspect the patient may have a degree of progression of her underlying renal disease -serum creatinine continues to rise -nephrology consulted, may require HD if no improvement -temporarily holding diuretics  -renal US--neg hydronephrosis  Chronic diastolic CHF -Daily weights -appears clinically euvolemic -2/27 echo--EF 55-60%, no WMA, G1DD, mild MR, s/pTAVR  Uncontrolled diabetes mellitus type 2 with hyperglycemia -2/27/21Hemoglobin A1c--6.6 -increaseLantus 25 units daily -NovoLog sliding scale -IncreaseNovoLog14units with meals -Holding glipizide, Jardiance, Metformin -elevated cbg due to steroids  CBG (last 3)  Recent Labs    02/14/20 0357 02/14/20 0831 02/14/20 1113  GLUCAP 214* 181* 217*    Hypertension -Continue amlodipine -controlled  Hypothyroidism -TSH 32.53 -pt reports not missing doses of levothyroxine, dose increased in hospital  Hyperlipidemia -Continue statin  Frequent falls -PT evaluation-->SNF -orthostatic vital signs neg  History of stroke -Continue aspirin  History of TAVR -Underwent TAVR 01/10/2019 -finished 6 months DAPT  Disposition Plan: Pt from home but has become too weak and deconditioned to return home, agreed to SNF rehab, continues to require inpatient care for IV steroids and continues to have acute on chronic respiratory failure requiring acute treatments, also renal function continues to decline and may require hemodialysis if no improvement, not ready to discharge today  Family Communication:Daughter updated on 3/2  Consultants:none  Code Status: FULL   DVT Prophylaxis: Deuel Heparin   Procedures: As Listed in Progress  Note Above  Antibiotics: Ceftriaxone 2/26>>> azithro 2/26>>>  Subjective: SOB but no chest pain.     Objective: Vitals:   02/13/20 2021 02/14/20 0357 02/14/20 0500 02/14/20 0758  BP:  (!) 154/92    Pulse:  70 78    Resp: 18 20    Temp:  97.6 F (36.4 C)    TempSrc:  Axillary    SpO2: 94% 97%  96%  Weight:   76.7 kg   Height:        Intake/Output Summary (Last 24 hours) at 02/14/2020 1243 Last data filed at 02/14/2020 1000 Gross per 24 hour  Intake 240 ml  Output 800 ml  Net -560 ml   Weight change: 1.8 kg Exam:   General:  Awake, alert, audible wheezing  HEENT: NCAT. Neck supple. No JVD.   Cardiovascular: normal s1,s2 sounds.   Respiratory: diffuse bilateral expiratory wheezes.   Abdomen: Soft/+BS, non tender, non distended, no guarding  Extremities: Non pitting edema, No lymphangitis, No petechiae, No rashes, no synovitis  Data Reviewed: I have personally reviewed following labs and imaging studies Basic Metabolic Panel: Recent Labs  Lab 02/10/20 0224 02/10/20 0918 02/11/20 0540 02/12/20 0416 02/13/20 0503 02/14/20 0515  NA 135  --  132* 127* 128* 131*  K 3.8  --  4.8 5.2* 5.0 5.0  CL 97*  --  96* 93* 95* 98  CO2 24  --  26 22 20* 21*  GLUCOSE 458*  --  292* 260* 208* 217*  BUN 25*  --  43* 59* 79* 93*  CREATININE 1.99*  --  2.34* 2.75* 3.05* 3.56*  CALCIUM 8.5*  --  8.5* 7.7* 7.7* 7.5*  MG  --  2.5* 2.6*  --   --   --   PHOS  --   --   --   --  5.8* 6.3*   Liver Function Tests: Recent Labs  Lab 02/13/20 0503 02/14/20 0515  ALBUMIN 3.5 3.4*   No results for input(s): LIPASE, AMYLASE in the last 168 hours. No results for input(s): AMMONIA in the last 168 hours. Coagulation Profile: No results for input(s): INR, PROTIME in the last 168 hours. CBC: Recent Labs  Lab 01/27/2020 1443 02/10/20 0224 02/11/20 0540  WBC 11.7* 11.2* 16.6*  NEUTROABS 8.4*  --   --   HGB 11.7* 11.3* 10.8*  HCT 36.0 35.1* 33.9*  MCV 88.2 87.8 89.2  PLT 366 370  378   Cardiac Enzymes: Recent Labs  Lab 02/12/20 1354  CKTOTAL 31*   BNP: Invalid input(s): POCBNP CBG: Recent Labs  Lab 02/13/20 1612 02/13/20 2105 02/14/20 0357 02/14/20 0831 02/14/20 1113  GLUCAP 200* 285* 214* 181* 217*   HbA1C: No results for input(s): HGBA1C in the last 72 hours. Urine analysis:    Component Value Date/Time   COLORURINE STRAW (A) 02/14/2020 1159   APPEARANCEUR CLEAR 02/14/2020 1159   LABSPEC 1.011 02/14/2020 1159   PHURINE 6.0 02/14/2020 1159   GLUCOSEU 150 (A) 02/14/2020 1159   HGBUR NEGATIVE 02/14/2020 1159   BILIRUBINUR NEGATIVE 02/14/2020 1159   KETONESUR NEGATIVE 02/14/2020 1159   PROTEINUR 100 (A) 02/14/2020 1159   UROBILINOGEN 0.2 09/23/2013 1926   NITRITE NEGATIVE 02/14/2020 1159   LEUKOCYTESUR NEGATIVE 02/14/2020 1159   Recent Results (from the past 240 hour(s))  SARS CORONAVIRUS 2 (TAT 6-24 HRS) Nasopharyngeal Nasopharyngeal Swab     Status: None   Collection Time: 02/08/2020  7:58 PM   Specimen: Nasopharyngeal Swab  Result Value Ref Range Status   SARS Coronavirus 2 NEGATIVE NEGATIVE Final    Comment: (NOTE) SARS-CoV-2 target nucleic acids are NOT DETECTED. The SARS-CoV-2 RNA is generally detectable in upper and lower respiratory specimens during the acute phase of infection. Negative results do not  preclude SARS-CoV-2 infection, do not rule out co-infections with other pathogens, and should not be used as the sole basis for treatment or other patient management decisions. Negative results must be combined with clinical observations, patient history, and epidemiological information. The expected result is Negative. Fact Sheet for Patients: SugarRoll.be Fact Sheet for Healthcare Providers: https://www.woods-mathews.com/ This test is not yet approved or cleared by the [place] FDA and  has been authorized for detection and/or diagnosis of SARS-CoV-2 by FDA under an Emergency Use  Authorization (EUA). This EUA will remain  in effect (meaning this test can be used) for the duration of the COVID-19 declaration under Section 56 4(b)(1) of the Act, 21 U.S.C. section 360bbb-3(b)(1), unless the authorization is terminated or revoked sooner. Performed at [facility], [place] [address] [place], [place] [address]   Respiratory Panel by PCR     Status: Abnormal   Collection Time: 02/11/20  6:10 PM   Specimen: Nasopharyngeal Swab; Respiratory  Result Value Ref Range Status   Adenovirus NOT DETECTED NOT DETECTED Final   Coronavirus 229E NOT DETECTED NOT DETECTED Final    Comment: (NOTE) The Coronavirus on the Respiratory Panel, DOES NOT test for the novel  Coronavirus (2019 nCoV)    Coronavirus HKU1 NOT DETECTED NOT DETECTED Final   Coronavirus NL63 NOT DETECTED NOT DETECTED Final   Coronavirus OC43 NOT DETECTED NOT DETECTED Final   Metapneumovirus NOT DETECTED NOT DETECTED Final   Rhinovirus / Enterovirus DETECTED (A) NOT DETECTED Final   Influenza A NOT DETECTED NOT DETECTED Final   Influenza B NOT DETECTED NOT DETECTED Final   Parainfluenza Virus 1 NOT DETECTED NOT DETECTED Final   Parainfluenza Virus 2 NOT DETECTED NOT DETECTED Final   Parainfluenza Virus 3 NOT DETECTED NOT DETECTED Final   Parainfluenza Virus 4 NOT DETECTED NOT DETECTED Final   Respiratory Syncytial Virus NOT DETECTED NOT DETECTED Final   Bordetella pertussis NOT DETECTED NOT DETECTED Final   Chlamydophila pneumoniae NOT DETECTED NOT DETECTED Final   Mycoplasma pneumoniae NOT DETECTED NOT DETECTED Final    Comment: Performed at [facility], [place]. [address] [place], [place] [address]  Expectorated sputum assessment w rflx to resp cult     Status: None (Preliminary result)   Collection Time: 02/12/20  2:27 PM   Specimen: Sputum  Result Value Ref Range Status   Specimen Description SPUTUM  Final   Special Requests NONE  Final   Sputum evaluation   Final    THIS SPECIMEN IS ACCEPTABLE  FOR SPUTUM CULTURE Performed at [facility] Performed at [facility], [address] [place], [place] [address]    Report Status PENDING  Incomplete  Culture, respiratory     Status: None   Collection Time: 02/12/20  2:27 PM   Specimen: SPU  Result Value Ref Range Status   Specimen Description   Final    SPUTUM Performed at [facility], [address] [place], [place] [address]    Special Requests   Final    NONE Reflexed from [id number] Performed at [facility], [address] [place], [place] [address]    Gram Stain NO WBC SEEN RARE GRAM POSITIVE COCCI   Final   Culture   Final    Consistent with normal respiratory flora. Performed at [facility], [place] [address], [place], [place] [address]    Report Status 02/14/2020 FINAL  Final     Scheduled Meds:  amLODipine  10 mg Oral Daily  arformoterol  15 mcg Nebulization BID   aspirin EC  81 mg Oral Daily   budesonide (PULMICORT) nebulizer solution  0.5 mg Nebulization BID   gabapentin  300 mg Oral BID   heparin  5,000 Units Subcutaneous Q8H   insulin aspart  0-20 Units Subcutaneous TID WC   insulin aspart  0-5 Units Subcutaneous QHS   insulin aspart  14 Units Subcutaneous TID WC   insulin glargine  35 Units Subcutaneous Daily   ipratropium-albuterol  3 mL Nebulization Q6H WA   levothyroxine  125 mcg Oral Daily   mouth rinse  15 mL Mouth Rinse BID   methylPREDNISolone (SOLU-MEDROL) injection  60 mg Intravenous Q6H   multivitamin-lutein  1 capsule Oral Daily   simvastatin  20 mg Oral Daily   sodium chloride flush  3 mL Intravenous Q12H   timolol  1 drop Both Eyes BID   vitamin B-12  1,000 mcg Oral Daily   Continuous Infusions:  sodium chloride      Procedures/Studies: CT CHEST WO CONTRAST  Result Date: 02/10/2020 CLINICAL DATA:  Cough, persistent COPD exacerbation. Chest x-ray with questionable abnormalities. EXAM: CT CHEST WITHOUT CONTRAST TECHNIQUE: Multidetector CT imaging of the  chest was performed following the standard protocol without IV contrast. COMPARISON:  Chest x-ray of 01/28/2020 and CT a chest abdomen and pelvis from 08/18/2018 FINDINGS: Cardiovascular: Calcified atherosclerotic changes and three-vessel coronary artery disease with evidence of prior trans arterial aortic valve replacement. Heart size mildly enlarged without pericardial effusion. Limited assessment of vascular structures due to lack of intravenous contrast. Central pulmonary vasculature mildly dilated at 2.7 cm. Mediastinum/Nodes: Mildly enlarged AP window lymph node 11 mm not changed since 2019. Scattered small lymph nodes throughout the chest none with pathologic enlargement. Hilar structures not well evaluated. No thoracic inlet adenopathy. No thoracic inlet adenopathy. Esophagus with mild circumferential thickening distally. Lungs/Pleura: Patchy opacities at the right lung apex with surrounding ground-glass and peripheral predominance. Some of these areas show small central solid-appearing nodules, largest measuring 4 mm (image 55, series 5) process appears confined to the right upper lobe. Airways are patent. Some distortion of the right hilum and mild narrowing of the bronchus intermedius is unchanged and may reflect mild bronchomalacia. Scattered areas of scarring in the lingula are stable. Upper Abdomen: Incidental imaging of upper abdominal contents showing no signs of acute abnormality. Musculoskeletal: No acute bone finding or destructive bone process. IMPRESSION: 1. Patchy opacities at the right lung apex with surrounding ground-glass and peripheral predominance. Process appears confined to mainly to the right upper lobe. This would be atypical for metastases. Infection with viral or atypical process is considered. Correlation with blood cultures may be helpful to exclude hematogenous process in this patient with aortic valve replacement. 2. Stable mildly enlarged AP window lymph node. Attention on  follow-up 3. Mild circumferential thickening of the distal esophagus may reflect esophagitis. 4. Calcified atherosclerotic changes and three-vessel coronary artery disease with evidence of prior trans arterial aortic valve replacement. Aortic Atherosclerosis (ICD10-I70.0). Electronically Signed   By: Zetta Bills M.D.   On: 02/10/2020 17:58   US RENAL  Result Date: 02/12/2020 CLINICAL DATA:  Acute renal failure superimposed upon chronic kidney disease, diabetes mellitus, hypertension, hyperlipidemia, horseshoe kidney EXAM: RENAL / URINARY TRACT ULTRASOUND COMPLETE COMPARISON:  CT abdomen 08/18/2018 FINDINGS: Right Kidney: Renal measurements: 14.4 x 4.9 x 6.7 cm = volume: 250 mL. Extremely poorly visualized due to cortical thinning, increased cortical echogenicity, and body habitus. No gross evidence of mass or hydronephrosis  are identified on limited assessment. Left Kidney: Renal measurements: 10.0 x 4.1 x 4.9 cm = volume: 106 mL. Extremely poorly visualized due to cortical thinning, increased cortical echogenicity, and body habitus. No gross evidence of mass or hydronephrosis on limited assessment. Bladder: Decompressed by catheter, unable to evaluate Other: N/A IMPRESSION: Severe limitations of exam secondary to body habitus, bowel gas, and horseshoe anatomy. There appears to be cortical thinning and increased cortical echogenicity of the renal moieties bilaterally without gross evidence of mass or hydronephrosis on limited assessment. Electronically Signed   By: Lavonia Dana M.D.   On: 02/12/2020 14:59   DG Chest Portable 1 View  Result Date: 01/28/2020 CLINICAL DATA:  Wheezing EXAM: PORTABLE CHEST 1 VIEW COMPARISON:  08/29/2019 FINDINGS: Stable mild cardiomegaly status post TAVR. Calcific aortic knob. Bilateral interstitial opacities, right worse than left with areas of subtle nodularity most prominent in the right upper lobe. No pleural effusion or pneumothorax. IMPRESSION: Bilateral interstitial  opacities, right worse than left with areas of subtle nodularity most prominent in the right upper lobe. Findings could reflect atypical or viral pneumonia. Radiographic follow-up to resolution is recommended. Electronically Signed   By: Davina Poke D.O.   On: 01/25/2020 14:46   ECHOCARDIOGRAM COMPLETE  Result Date: 02/10/2020    ECHOCARDIOGRAM REPORT   Patient Name:   [name] [name] Date of Exam: 02/10/2020 Medical Rec #:  [id number]         Height:       57.0 in Accession #:    [id number]        Weight:       150.4 lb Date of Birth:  Dec 16, 1945         BSA:          1.593 m Patient Age:    45 years          BP:           128/61 mmHg Patient Gender: F                 HR:           75 bpm. Exam Location:  Inpatient Procedure: 2D Echo and Strain Analysis Indications:    Aortic Stenosis 424.1 / 135.0  History:        Patient has prior history of Echocardiogram examinations, most                 recent 01/11/2019. Stroke, Signs/Symptoms:Shortness of Breath;                 Risk Factors:Hypertension, Diabetes and Dyslipidemia. Pneumonia.                 Chronic kidney disease. Bilateral carotid stenosis.                 Aortic Valve: 23 mm Edwards Sapien prosthetic, stented (TAVR)                 valve is present in the aortic position. Procedure Date:                 01/10/2019.  Sonographer:    Darlina Sicilian RDCS Referring Phys: [id number] [place]  1. Left ventricular ejection fraction, by estimation, is 55 to 60%. The left ventricle has normal function. The left ventricle has no regional wall motion abnormalities. There is moderate concentric left ventricular hypertrophy. Left ventricular diastolic parameters are consistent with Grade I diastolic dysfunction (impaired relaxation). Elevated left ventricular end-diastolic  pressure.  2. Right ventricular systolic function is normal. The right ventricular size is normal.  3. The mitral valve is normal in structure and function. Mild mitral  annular calcification. Mild mitral valve regurgitation. No evidence of mitral stenosis.  4. The aortic valve has been repaired/replaced. There is trivial perivalvular AI ipresent. No aortic stenosis is present. There is a 23 mm Edwards Sapien prosthetic (TAVR) valve present in the aortic position. that is funcitoning normaly. Aortic valve mean gradient measures 12.0 mmHg. Aortic valve peak gradient measures 21.8 mmHg. Aortic valve area, by VTI measures 1.66 cm. Dimensionless index is 0.44 and the AVA is calculated to be 1.51cm2.  5. The inferior vena cava is normal in size with greater than 50% respiratory variability, suggesting right atrial pressure of 3 mmHg.  6. Compared to prior echo, the mean TAVR gradient has increased from 11 to 41mmhg and AVG is 1.51cm2 (was 1.88cm2 on last echo). FINDINGS  Left Ventricle: Left ventricular ejection fraction, by estimation, is 55 to 60%. The left ventricle has normal function. The left ventricle has no regional wall motion abnormalities. The left ventricular internal cavity size was normal in size. There is  moderate concentric left ventricular hypertrophy. Left ventricular diastolic parameters are consistent with Grade I diastolic dysfunction (impaired relaxation). Elevated left ventricular end-diastolic pressure. Right Ventricle: The right ventricular size is normal. No increase in right ventricular wall thickness. Right ventricular systolic function is normal. Left Atrium: Left atrial size was normal in size. Right Atrium: Right atrial size was normal in size. Pericardium: Trivial pericardial effusion is present. The pericardial effusion is posterior to the left ventricle. Mitral Valve: The mitral valve is normal in structure and function. Normal mobility of the mitral valve leaflets. Mild mitral annular calcification. Mild mitral valve regurgitation. No evidence of mitral valve stenosis. Tricuspid Valve: The tricuspid valve is normal in structure. Tricuspid valve  regurgitation is not demonstrated. No evidence of tricuspid stenosis. Aortic Valve: The aortic valve has been repaired/replaced. Aortic valve regurgitation trivial perivalvular AI is present. No aortic stenosis is present. Aortic valve mean gradient measures 12.0 mmHg. Aortic valve peak gradient measures 21.8 mmHg. Aortic valve area, by VTI measures 1.66 cm. There is a 23 mm Edwards Sapien prosthetic, stented (TAVR) valve present in the aortic position. Procedure Date: 01/10/2019. Pulmonic Valve: The pulmonic valve was normal in structure. Pulmonic valve regurgitation is not visualized. No evidence of pulmonic stenosis. Aorta: The aortic root is normal in size and structure. Venous: The inferior vena cava is normal in size with greater than 50% respiratory variability, suggesting right atrial pressure of 3 mmHg. IAS/Shunts: No atrial level shunt detected by color flow Doppler.  LEFT VENTRICLE PLAX 2D LVIDd:         4.79 cm      Diastology LVIDs:         3.43 cm      LV e' lateral:   4.35 cm/s LV PW:         1.22 cm      LV E/e' lateral: 29.7 LV IVS:        1.34 cm      LV e' medial:    3.00 cm/s LVOT diam:     2.20 cm      LV E/e' medial:  43.0 LV SV:         82 LV SV Index:   52 LVOT Area:     3.80 cm  LV Volumes (MOD) LV vol d, MOD A2C: 99.5 ml  LV vol d, MOD A4C: 125.0 ml LV vol s, MOD A2C: 50.2 ml LV vol s, MOD A4C: 56.4 ml LV SV MOD A2C:     49.3 ml LV SV MOD A4C:     125.0 ml LV SV MOD BP:      58.3 ml RIGHT VENTRICLE RV S prime:     11.20 cm/s TAPSE (M-mode): 1.6 cm LEFT ATRIUM             Index       RIGHT ATRIUM           Index LA diam:        3.60 cm 2.26 cm/m  RA Area:     15.20 cm LA Vol (A2C):   55.7 ml 34.96 ml/m RA Volume:   34.70 ml  21.78 ml/m LA Vol (A4C):   49.6 ml 31.13 ml/m LA Biplane Vol: 53.2 ml 33.39 ml/m  AORTIC VALVE AV Area (Vmax):    1.51 cm AV Area (Vmean):   1.61 cm AV Area (VTI):     1.66 cm AV Vmax:           233.50 cm/s AV Vmean:          162.000 cm/s AV VTI:             0.496 m AV Peak Grad:      21.8 mmHg AV Mean Grad:      12.0 mmHg LVOT Vmax:         92.50 cm/s LVOT Vmean:        68.400 cm/s LVOT VTI:          0.217 m LVOT/AV VTI ratio: 0.44  AORTA Ao Root diam: 3.10 cm Ao Asc diam:  3.00 cm MITRAL VALVE MV Area (PHT): 3.21 cm     SHUNTS MV Decel Time: 236 msec     Systemic VTI:  0.22 m MV E velocity: 129.00 cm/s  Systemic Diam: 2.20 cm MV A velocity: 144.00 cm/s MV E/A ratio:  0.90 Fransico Him MD Electronically signed by Fransico Him MD Signature Date/Time: 02/10/2020/5:45:00 PM    Final     Irwin Brakeman, MD  How to contact the [facility] Attending or Consulting provider [place] or covering provider during after hours 7P -7A, for this patient?  1. Check the care team in [facility] and look for a) attending/consulting TRH provider listed and b) the [facility] team listed 2. Log into [email or web address] and use [place]'s universal password to access. If you do not have the password, please contact the hospital operator. 3. Locate the [facility] provider you are looking for under [facility] and page to a number that you can be directly reached. 4. If you still have difficulty reaching the provider, please page the [facility] (Director on Call) for the Hospitalists listed on amion for assistance.   If 7PM-7AM, please contact night-coverage [email or web address] Password [email or web address] 02/14/2020, 12:43 PM   LOS: 5 days

## 2020-02-14 NOTE — TOC Progression Note (Signed)
Transition of Care [facility]) - Progression Note    Patient Details  Name: [name] [name] MRN: [id number] Date of Birth: October 06, 1946  Transition of Care [facility]) CM/SW Contact  Ihor Gully, LCSW Phone Number: 02/14/2020, 11:49 AM  Clinical Narrative:    Patient accepts bed offer from [facility] at [facility] advised of decision and need to start authorization. Mardene Celeste indicated that authorization would be started today.   Expected Discharge Plan: Skilled Nursing Facility Barriers to Discharge: Continued Medical Work up  Expected Discharge Plan and Services Expected Discharge Plan: [place]                                               Social Determinants of Health (SDOH) Interventions    Readmission Risk Interventions Readmission Risk Prevention Plan 02/12/2020  Transportation Screening Complete  PCP or Specialist Appt within 3-5 Days Not Complete  HRI or [place] Complete  Social Work Consult for [place] Planning/Counseling Complete  Palliative Care Screening Not Complete  Medication Review Press photographer) Complete  Some recent data might be hidden

## 2020-02-14 NOTE — Progress Notes (Signed)
Patient ID: KAMAIYA ANTILLA, female   DOB: [birth year], 74 y.o.   MRN: [id number] S: Breathing better but has some wheezes.  Denies any N/V/anorexia. O:BP (!) 154/92 (BP Location: Right Arm)   Pulse 78   Temp 97.6 F (36.4 C) (Axillary)   Resp 20   Ht 4\' 9"  (1.448 m)   Wt 76.7 kg   SpO2 96%   BMI 36.59 kg/m   Intake/Output Summary (Last 24 hours) at 02/14/2020 0928 Last data filed at 02/14/2020 0407 Gross per 24 hour  Intake 318 ml  Output 900 ml  Net -582 ml   Intake/Output: I/O last 3 completed shifts: In: 318 [P.O.:318] Out: 1100 [Urine:1100]  Intake/Output this shift:  No intake/output data recorded. Weight change: 1.8 kg Gen: frail, elderly WF with slowed mentation but appropriate CVS: no rub Resp: scattered exp wheezes bilaterally Abd: +BS, soft, NT/ND Ext: 1+ pretibial edema  Recent Labs  Lab 02/02/2020 1443 02/10/20 0224 02/11/20 0540 02/12/20 0416 02/13/20 0503 02/14/20 0515  NA 138 135 132* 127* 128* 131*  K 3.2* 3.8 4.8 5.2* 5.0 5.0  CL 99 97* 96* 93* 95* 98  CO2 27 24 26 22  20* 21*  GLUCOSE 134* 458* 292* 260* 208* 217*  BUN 23 25* 43* 59* 79* 93*  CREATININE 2.01* 1.99* 2.34* 2.75* 3.05* 3.56*  ALBUMIN  --   --   --   --  3.5 3.4*  CALCIUM 8.9 8.5* 8.5* 7.7* 7.7* 7.5*  PHOS  --   --   --   --  5.8* 6.3*   Liver Function Tests: Recent Labs  Lab 02/13/20 0503 02/14/20 0515  ALBUMIN 3.5 3.4*   No results for input(s): LIPASE, AMYLASE in the last 168 hours. No results for input(s): AMMONIA in the last 168 hours. CBC: Recent Labs  Lab 01/17/2020 1443 02/10/20 0224 02/11/20 0540  WBC 11.7* 11.2* 16.6*  NEUTROABS 8.4*  --   --   HGB 11.7* 11.3* 10.8*  HCT 36.0 35.1* 33.9*  MCV 88.2 87.8 89.2  PLT 366 370 378   Cardiac Enzymes: Recent Labs  Lab 02/12/20 1354  CKTOTAL 31*   CBG: Recent Labs  Lab 02/13/20 1152 02/13/20 1612 02/13/20 2105 02/14/20 0357 02/14/20 0831  GLUCAP 189* 200* 285* 214* 181*    Iron Studies: No results for  input(s): IRON, TIBC, TRANSFERRIN, FERRITIN in the last 72 hours. Studies/Results: US RENAL  Result Date: 02/12/2020 CLINICAL DATA:  Acute renal failure superimposed upon chronic kidney disease, diabetes mellitus, hypertension, hyperlipidemia, horseshoe kidney EXAM: RENAL / URINARY TRACT ULTRASOUND COMPLETE COMPARISON:  CT abdomen 08/18/2018 FINDINGS: Right Kidney: Renal measurements: 14.4 x 4.9 x 6.7 cm = volume: 250 mL. Extremely poorly visualized due to cortical thinning, increased cortical echogenicity, and body habitus. No gross evidence of mass or hydronephrosis are identified on limited assessment. Left Kidney: Renal measurements: 10.0 x 4.1 x 4.9 cm = volume: 106 mL. Extremely poorly visualized due to cortical thinning, increased cortical echogenicity, and body habitus. No gross evidence of mass or hydronephrosis on limited assessment. Bladder: Decompressed by catheter, unable to evaluate Other: N/A IMPRESSION: Severe limitations of exam secondary to body habitus, bowel gas, and horseshoe anatomy. There appears to be cortical thinning and increased cortical echogenicity of the renal moieties bilaterally without gross evidence of mass or hydronephrosis on limited assessment. Electronically Signed   By: Lavonia Dana M.D.   On: 02/12/2020 14:59   . amLODipine  10 mg Oral Daily  . arformoterol  15 mcg Nebulization  BID  . aspirin EC  81 mg Oral Daily  . budesonide (PULMICORT) nebulizer solution  0.5 mg Nebulization BID  . gabapentin  300 mg Oral BID  . heparin  5,000 Units Subcutaneous Q8H  . insulin aspart  0-20 Units Subcutaneous TID WC  . insulin aspart  0-5 Units Subcutaneous QHS  . insulin aspart  14 Units Subcutaneous TID WC  . insulin glargine  35 Units Subcutaneous Daily  . ipratropium-albuterol  3 mL Nebulization Q6H WA  . levothyroxine  125 mcg Oral Daily  . mouth rinse  15 mL Mouth Rinse BID  . methylPREDNISolone (SOLU-MEDROL) injection  60 mg Intravenous Q6H  . multivitamin-lutein  1  capsule Oral Daily  . simvastatin  20 mg Oral Daily  . sodium chloride flush  3 mL Intravenous Q12H  . timolol  1 drop Both Eyes BID  . vitamin B-12  1,000 mcg Oral Daily    BMET    Component Value Date/Time   NA 131 (L) 02/14/2020 0515   NA 139 08/04/2018 1658   K 5.0 02/14/2020 0515   CL 98 02/14/2020 0515   CO2 21 (L) 02/14/2020 0515   GLUCOSE 217 (H) 02/14/2020 0515   BUN 93 (H) 02/14/2020 0515   BUN 21 08/04/2018 1658   CREATININE 3.56 (H) 02/14/2020 0515   CALCIUM 7.5 (L) 02/14/2020 0515   GFRNONAA 12 (L) 02/14/2020 0515   GFRAA 14 (L) 02/14/2020 0515   CBC    Component Value Date/Time   WBC 16.6 (H) 02/11/2020 0540   RBC 3.80 (L) 02/11/2020 0540   HGB 10.8 (L) 02/11/2020 0540   HGB 13.0 08/04/2018 1658   HCT 33.9 (L) 02/11/2020 0540   HCT 38.6 08/04/2018 1658   PLT 378 02/11/2020 0540   PLT 284 08/04/2018 1658   MCV 89.2 02/11/2020 0540   MCV 87 08/04/2018 1658   MCH 28.4 02/11/2020 0540   MCHC 31.9 02/11/2020 0540   RDW 14.6 02/11/2020 0540   RDW 14.9 08/04/2018 1658   LYMPHSABS 2.1 02/05/2020 1443   MONOABS 0.8 02/10/2020 1443   EOSABS 0.3 01/24/2020 1443   BASOSABS 0.1 01/21/2020 1443     Assessment/Plan:  1. Non-oliguric, AKI/CKD stage 3b- renal US limited but no obstruction.  UA with significant proteinuria.  Presumably due to ischemic ATN, however her BUN/Cr continue to climb and is now with myoclonic jerks (likely from gabapentin toxicity but also could be uremia).  If BUN/Cr continue to climb will need to consult surgery for HD catheter placement and initiate HD.  I discussed this with Ms. Tarpley and she is amenable "if I need it" 2. Acute on chronic hypoxic respiratory failure- due to PNA and underlying COPD exacerbation.  Continue with nebs and IV solumedrol (which likely contributes to azotemia).  Has been on solumedrol for 5 days. 3. CAP- right lung- on ceftriaxone and azythromycin 4. Chronic diastolic CHF- some edema of lower ext despite  being negative with I's/O's.  Cont to follow but may need HD or addition of diuretics 5. Proteinuria- will quantify 6. Severe aortic stenosis s/p TAVR 01/10/19 7. DM type 2  Donetta Potts, MD [facility] [phone]

## 2020-02-15 LAB — CBC
HCT: 33.8 % — ABNORMAL LOW (ref 36.0–46.0)
Hemoglobin: 10.8 g/dL — ABNORMAL LOW (ref 12.0–15.0)
MCH: 28.1 pg (ref 26.0–34.0)
MCHC: 32 g/dL (ref 30.0–36.0)
MCV: 88 fL (ref 80.0–100.0)
Platelets: 389 10*3/uL (ref 150–400)
RBC: 3.84 MIL/uL — ABNORMAL LOW (ref 3.87–5.11)
RDW: 14.7 % (ref 11.5–15.5)
WBC: 12.6 10*3/uL — ABNORMAL HIGH (ref 4.0–10.5)
nRBC: 0.3 % — ABNORMAL HIGH (ref 0.0–0.2)

## 2020-02-15 LAB — RENAL FUNCTION PANEL
Albumin: 3.4 g/dL — ABNORMAL LOW (ref 3.5–5.0)
Anion gap: 13 (ref 5–15)
BUN: 101 mg/dL — ABNORMAL HIGH (ref 8–23)
CO2: 20 mmol/L — ABNORMAL LOW (ref 22–32)
Calcium: 7.7 mg/dL — ABNORMAL LOW (ref 8.9–10.3)
Chloride: 101 mmol/L (ref 98–111)
Creatinine, Ser: 3.52 mg/dL — ABNORMAL HIGH (ref 0.44–1.00)
GFR calc Af Amer: 14 mL/min — ABNORMAL LOW (ref >60)
GFR calc non Af Amer: 12 mL/min — ABNORMAL LOW (ref >60)
Glucose, Bld: 203 mg/dL — ABNORMAL HIGH (ref 70–99)
Phosphorus: 6.4 mg/dL — ABNORMAL HIGH (ref 2.5–4.6)
Potassium: 4.8 mmol/L (ref 3.5–5.1)
Sodium: 134 mmol/L — ABNORMAL LOW (ref 135–145)

## 2020-02-15 LAB — GLUCOSE, CAPILLARY
Glucose-Capillary: 237 mg/dL — ABNORMAL HIGH (ref 70–99)
Glucose-Capillary: 179 mg/dL — ABNORMAL HIGH (ref 70–99)
Glucose-Capillary: 137 mg/dL — ABNORMAL HIGH (ref 70–99)
Glucose-Capillary: 124 mg/dL — ABNORMAL HIGH (ref 70–99)

## 2020-02-15 LAB — PROTEIN ELECTROPHORESIS, SERUM
A/G Ratio: 1.2 (ref 0.7–1.7)
Albumin ELP: 3.5 g/dL (ref 2.9–4.4)
Alpha-1-Globulin: 0.2 g/dL (ref 0.0–0.4)
Alpha-2-Globulin: 0.8 g/dL (ref 0.4–1.0)
Beta Globulin: 0.9 g/dL (ref 0.7–1.3)
Gamma Globulin: 1 g/dL (ref 0.4–1.8)
Globulin, Total: 2.9 g/dL (ref 2.2–3.9)
Total Protein ELP: 6.4 g/dL (ref 6.0–8.5)

## 2020-02-15 LAB — KAPPA/LAMBDA LIGHT CHAINS
Kappa free light chain: 40.1 mg/L — ABNORMAL HIGH (ref 3.3–19.4)
Kappa, lambda light chain ratio: 1.68 — ABNORMAL HIGH (ref 0.26–1.65)
Lambda free light chains: 23.9 mg/L (ref 5.7–26.3)

## 2020-02-15 LAB — PROTEIN, URINE, 24 HOUR
Collection Interval-UPROT: 24 hours
Protein, 24H Urine: 2010 mg/d — ABNORMAL HIGH (ref 50–100)
Protein, Urine: 134 mg/dL
Urine Total Volume-UPROT: 1500 mL

## 2020-02-15 MED ORDER — INSULIN GLARGINE 100 UNIT/ML ~~LOC~~ SOLN
20.0000 [IU] | Freq: Every day | SUBCUTANEOUS | Status: DC
  Administered 2020-02-16: 10:00:00 20 [IU] via SUBCUTANEOUS
  Filled 2020-02-15 (×4): qty 0.2

## 2020-02-15 MED ORDER — INSULIN ASPART 100 UNIT/ML ~~LOC~~ SOLN
8.0000 [IU] | Freq: Three times a day (TID) | SUBCUTANEOUS | Status: DC
  Administered 2020-02-15: 8 [IU] via SUBCUTANEOUS

## 2020-02-15 MED ORDER — IPRATROPIUM-ALBUTEROL 0.5-2.5 (3) MG/3ML IN SOLN
3.0000 mL | Freq: Three times a day (TID) | RESPIRATORY_TRACT | Status: DC
  Administered 2020-02-16 – 2020-02-18 (×9): 3 mL via RESPIRATORY_TRACT
  Filled 2020-02-15 (×9): qty 3

## 2020-02-15 NOTE — Progress Notes (Signed)
Pt given PRN miralax per request.

## 2020-02-15 NOTE — Progress Notes (Signed)
PROGRESS NOTE  [name] [name] ENI:[id number] DOB: [birth year] DOA: 01/22/2020 PCP: [employer], MD   Brief History: 74 year old female with a history of diastolic CHF, COPD, CKD stage III, diabetes mellitus type 2, hypertension, severe aortic stenosis status post TAVR 05/27/4314, embolic stroke presenting with 3-day history of generalized weakness, coughing, shortness of breath, and wheezing. The patient also had some unsteady gait and fell. She did not lose consciousness. The patient has not been able to use her nebulizer machine at home secondary to malfunctioning. She denies any fevers, chills, chest pain, vomiting, diarrhea, abdominal pain. Notably, the patient had a hospitalization from 01/10/2019 to2/02/2019 where she underwent TAVR. She had postoperative acute on chronic renal failure. Her postoperative course was also complicated by bilateral lower extremity weakness and myoclonus.She was seen by neurology at that time, and it was felt to be secondary to gabapentin toxicity in the setting of her acute on chronic renal failure. MRI of the brain did show cardioembolic stroke. CTA of the head and neck was negative for LVO. But it did show advanced intracranial atherosclerosis with moderate to severe right and moderate left intracranial ICA stenosis. The patient was subsequently discharged to [facility].  In the emergency department, the patient was afebrile hemodynamically stable with oxygen saturation 95% room air. WBC 11.7. Serum creatinine was 2.01. Chest x-ray showed bilateral interstitial opacities. The patient was started on Solu-Medrol for COPD exacerbation.  Assessment/Plan: Acute Respiratory Failure with hypoxia -due to PNA and COPD exacerbation -stable on 2L -wean oxygen as able  COPD exacerbation -ContinuePulmicort -Completed IV Solu-Medrol -Continue duonebs -added brovana  R-lungopacity/CAP -Completed course of empiric ceftriaxone and  azithromycin -Procalcitonin<0.10 -CT chest--Patchy opacities at the right lung apex with surrounding ground-glass and peripheral predominance; bronchomalacia; circumferential esophageal thickening  Acute on chronic renal failure CKD stage IIIb -Baseline creatinine 1.5-1.6, has been >3 for last several days, nephrology following, may need temp HD. -Suspect the patient may have a degree of progression of her underlying renal disease -serum creatinine continues to rise -nephrology consulted, may require HD if no improvement -temporarily holding diuretics  -renal US--neg hydronephrosis  Chronic diastolic CHF -Daily weights -appears clinically euvolemic -2/27 echo--EF 55-60%, no WMA, G1DD, mild MR, s/pTAVR  Uncontrolled diabetes mellitus type 2 with hyperglycemia -2/27/21Hemoglobin A1c--6.6 -increaseLantus 25 units daily -NovoLog sliding scale -IncreaseNovoLog14units with meals -Holding glipizide, Jardiance, Metformin -elevated cbg due to steroids  CBG (last 3)  Recent Labs    02/14/20 2119 02/15/20 0741 02/15/20 1142  GLUCAP 226* 237* 179*    Hypertension -Continue amlodipine -controlled  Hypothyroidism -TSH 32.53 -pt reports not missing doses of levothyroxine, dose increased in hospital  Hyperlipidemia -Continue statin  Frequent falls -PT evaluation-->SNF -orthostatic vital signs neg  History of stroke -Continue aspirin  History of TAVR -Underwent TAVR 01/10/2019 -finished 6 months DAPT  Disposition Plan: Pt from home but has become too weak and deconditioned to return home, agreed to SNF rehab, continues to require inpatient care for IV steroids and continues to have acute on chronic respiratory failure requiring acute treatments, also renal function continues to decline and may require hemodialysis if no improvement, not ready to discharge today due primary to renal issue, otherwise ready for SNF   Family Communication:Daughter updated on  3/2, updated again 3/4  Consultants:none  Code Status: FULL   DVT Prophylaxis: [place] Heparin   Procedures: As Listed in Progress Note Above  Antibiotics: Ceftriaxone 2/26>>> azithro 2/26>>>  Subjective: Pt reports that her  SOB is much better today.      Objective: Vitals:   02/15/20 0309 02/15/20 0500 02/15/20 0648 02/15/20 0821  BP:   (!) 142/74   Pulse:   75   Resp:   (!) 21   Temp:      TempSrc:      SpO2: 96%  98% 96%  Weight:  76.6 kg    Height:        Intake/Output Summary (Last 24 hours) at 02/15/2020 1238 Last data filed at 02/15/2020 0500 Gross per 24 hour  Intake 360 ml  Output 1300 ml  Net -940 ml   Weight change: -0.1 kg Exam:   General:  Awake, alert, audible wheezing  HEENT: NCAT. Neck supple. No JVD.   Cardiovascular: normal s1,s2 sounds.   Respiratory: diffuse bilateral expiratory wheezes.   Abdomen: Soft/+BS, non tender, non distended, no guarding  Extremities: Non pitting edema, No lymphangitis, No petechiae, No rashes, no synovitis  Data Reviewed: I have personally reviewed following labs and imaging studies Basic Metabolic Panel: Recent Labs  Lab 02/10/20 0224 02/10/20 0918 02/11/20 0540 02/12/20 0416 02/13/20 0503 02/14/20 0515 02/15/20 0412  NA   < >  --  132* 127* 128* 131* 134*  K   < >  --  4.8 5.2* 5.0 5.0 4.8  CL   < >  --  96* 93* 95* 98 101  CO2   < >  --  26 22 20* 21* 20*  GLUCOSE   < >  --  292* 260* 208* 217* 203*  BUN   < >  --  43* 59* 79* 93* 101*  CREATININE   < >  --  2.34* 2.75* 3.05* 3.56* 3.52*  CALCIUM   < >  --  8.5* 7.7* 7.7* 7.5* 7.7*  MG  --  2.5* 2.6*  --   --   --   --   PHOS  --   --   --   --  5.8* 6.3* 6.4*   < > = values in this interval not displayed.   Liver Function Tests: Recent Labs  Lab 02/13/20 0503 02/14/20 0515 02/15/20 0412  ALBUMIN 3.5 3.4* 3.4*   No results for input(s): LIPASE, AMYLASE in the last 168 hours. No results for input(s): AMMONIA in the last 168  hours. Coagulation Profile: No results for input(s): INR, PROTIME in the last 168 hours. CBC: Recent Labs  Lab 02/04/2020 1443 02/10/20 0224 02/11/20 0540 02/15/20 0412  WBC 11.7* 11.2* 16.6* 12.6*  NEUTROABS 8.4*  --   --   --   HGB 11.7* 11.3* 10.8* 10.8*  HCT 36.0 35.1* 33.9* 33.8*  MCV 88.2 87.8 89.2 88.0  PLT 366 370 378 389   Cardiac Enzymes: Recent Labs  Lab 02/12/20 1354  CKTOTAL 31*   BNP: Invalid input(s): POCBNP CBG: Recent Labs  Lab 02/14/20 1113 02/14/20 1603 02/14/20 2119 02/15/20 0741 02/15/20 1142  GLUCAP 217* 142* 226* 237* 179*   HbA1C: No results for input(s): HGBA1C in the last 72 hours. Urine analysis:    Component Value Date/Time   COLORURINE STRAW (A) 02/14/2020 1159   APPEARANCEUR CLEAR 02/14/2020 1159   LABSPEC 1.011 02/14/2020 1159   PHURINE 6.0 02/14/2020 1159   GLUCOSEU 150 (A) 02/14/2020 1159   HGBUR NEGATIVE 02/14/2020 1159   BILIRUBINUR NEGATIVE 02/14/2020 1159   KETONESUR NEGATIVE 02/14/2020 1159   PROTEINUR 100 (A) 02/14/2020 1159   UROBILINOGEN 0.2 09/23/2013 1926   NITRITE NEGATIVE 02/14/2020 1159  LEUKOCYTESUR NEGATIVE 02/14/2020 1159   Recent Results (from the past 240 hour(s))  SARS CORONAVIRUS 2 (TAT 6-24 HRS) Nasopharyngeal Nasopharyngeal Swab     Status: None   Collection Time: 02/01/2020  7:58 PM   Specimen: Nasopharyngeal Swab  Result Value Ref Range Status   SARS Coronavirus 2 NEGATIVE NEGATIVE Final    Comment: (NOTE) SARS-CoV-2 target nucleic acids are NOT DETECTED. The SARS-CoV-2 RNA is generally detectable in upper and lower respiratory specimens during the acute phase of infection. Negative results do not preclude SARS-CoV-2 infection, do not rule out co-infections with other pathogens, and should not be used as the sole basis for treatment or other patient management decisions. Negative results must be combined with clinical observations, patient history, and epidemiological information. The  expected result is Negative. Fact Sheet for Patients: SugarRoll.be Fact Sheet for Healthcare Providers: https://www.woods-mathews.com/ This test is not yet approved or cleared by the [place] FDA and  has been authorized for detection and/or diagnosis of SARS-CoV-2 by FDA under an Emergency Use Authorization (EUA). This EUA will remain  in effect (meaning this test can be used) for the duration of the COVID-19 declaration under Section 56 4(b)(1) of the Act, 21 U.S.C. section 360bbb-3(b)(1), unless the authorization is terminated or revoked sooner. Performed at [facility], [place] [address], [place], [place] [address]   Respiratory Panel by PCR     Status: Abnormal   Collection Time: 02/11/20  6:10 PM   Specimen: Nasopharyngeal Swab; Respiratory  Result Value Ref Range Status   Adenovirus NOT DETECTED NOT DETECTED Final   Coronavirus 229E NOT DETECTED NOT DETECTED Final    Comment: (NOTE) The Coronavirus on the Respiratory Panel, DOES NOT test for the novel  Coronavirus (2019 nCoV)    Coronavirus HKU1 NOT DETECTED NOT DETECTED Final   Coronavirus NL63 NOT DETECTED NOT DETECTED Final   Coronavirus OC43 NOT DETECTED NOT DETECTED Final   Metapneumovirus NOT DETECTED NOT DETECTED Final   Rhinovirus / Enterovirus DETECTED (A) NOT DETECTED Final   Influenza A NOT DETECTED NOT DETECTED Final   Influenza B NOT DETECTED NOT DETECTED Final   Parainfluenza Virus 1 NOT DETECTED NOT DETECTED Final   Parainfluenza Virus 2 NOT DETECTED NOT DETECTED Final   Parainfluenza Virus 3 NOT DETECTED NOT DETECTED Final   Parainfluenza Virus 4 NOT DETECTED NOT DETECTED Final   Respiratory Syncytial Virus NOT DETECTED NOT DETECTED Final   Bordetella pertussis NOT DETECTED NOT DETECTED Final   Chlamydophila pneumoniae NOT DETECTED NOT DETECTED Final   Mycoplasma pneumoniae NOT DETECTED NOT DETECTED Final    Comment: Performed at [facility],  [place], [place], [place] [address]  Expectorated sputum assessment w rflx to resp cult     Status: None (Preliminary result)   Collection Time: 02/12/20  2:27 PM   Specimen: Sputum  Result Value Ref Range Status   Specimen Description SPUTUM  Final   Special Requests NONE  Final   Sputum evaluation   Final    THIS SPECIMEN IS ACCEPTABLE FOR SPUTUM CULTURE Performed at [facility] Performed at [facility], [address] [place], [place] [address]    Report Status PENDING  Incomplete  Culture, respiratory     Status: None   Collection Time: 02/12/20  2:27 PM   Specimen: SPU  Result Value Ref Range Status   Specimen Description   Final    SPUTUM Performed at [facility], [address], [place],  [address]  Special Requests   Final    NONE Reflexed from [id number] Performed at [facility], [address], [place], [place] [address]    Gram Stain NO WBC SEEN RARE GRAM POSITIVE COCCI   Final   Culture   Final    Consistent with normal respiratory flora. Performed at [facility], [place] [address], [place], [place] [address]    Report Status 02/14/2020 FINAL  Final  SARS CORONAVIRUS 2 (TAT 6-24 HRS) Nasopharyngeal Nasopharyngeal Swab     Status: None   Collection Time: 02/14/20 11:57 AM   Specimen: Nasopharyngeal Swab  Result Value Ref Range Status   SARS Coronavirus 2 NEGATIVE NEGATIVE Final    Comment: (NOTE) SARS-CoV-2 target nucleic acids are NOT DETECTED. The SARS-CoV-2 RNA is generally detectable in upper and lower respiratory specimens during the acute phase of infection. Negative results do not preclude SARS-CoV-2 infection, do not rule out co-infections with other pathogens, and should not be used as the sole basis for treatment or other patient management decisions. Negative results must be combined with clinical observations, patient history, and epidemiological information. The expected result is Negative. Fact Sheet for  Patients: SugarRoll.be Fact Sheet for Healthcare Providers: https://www.woods-mathews.com/ This test is not yet approved or cleared by the [place] FDA and  has been authorized for detection and/or diagnosis of SARS-CoV-2 by FDA under an Emergency Use Authorization (EUA). This EUA will remain  in effect (meaning this test can be used) for the duration of the COVID-19 declaration under Section 56 4(b)(1) of the Act, 21 U.S.C. section 360bbb-3(b)(1), unless the authorization is terminated or revoked sooner. Performed at [facility], [place] [address] [place],  [address]      Scheduled Meds: . amLODipine  10 mg Oral Daily  . arformoterol  15 mcg Nebulization BID  . aspirin EC  81 mg Oral Daily  . budesonide (PULMICORT) nebulizer solution  0.5 mg Nebulization BID  . heparin  5,000 Units Subcutaneous Q8H  . insulin aspart  0-20 Units Subcutaneous TID WC  . insulin aspart  0-5 Units Subcutaneous QHS  . insulin aspart  14 Units Subcutaneous TID WC  . insulin glargine  35 Units Subcutaneous Daily  . ipratropium-albuterol  3 mL Nebulization Q6H WA  . levothyroxine  125 mcg Oral Daily  . mouth rinse  15 mL Mouth Rinse BID  . multivitamin-lutein  1 capsule Oral Daily  . simvastatin  20 mg Oral Daily  . sodium chloride flush  3 mL Intravenous Q12H  . timolol  1 drop Both Eyes BID  . vitamin B-12  1,000 mcg Oral Daily   Continuous Infusions: . sodium chloride      Procedures/Studies: CT CHEST WO CONTRAST  Result Date: 02/10/2020 CLINICAL DATA:  Cough, persistent COPD exacerbation. Chest x-ray with questionable abnormalities. EXAM: CT CHEST WITHOUT CONTRAST TECHNIQUE: Multidetector CT imaging of the chest was performed following the standard protocol without IV contrast. COMPARISON:  Chest x-ray of 01/26/2020 and CT a chest abdomen and pelvis from 08/18/2018 FINDINGS: Cardiovascular: Calcified atherosclerotic changes and  three-vessel coronary artery disease with evidence of prior trans arterial aortic valve replacement. Heart size mildly enlarged without pericardial effusion. Limited assessment of vascular structures due to lack of intravenous contrast. Central pulmonary vasculature mildly dilated at 2.7 cm. Mediastinum/Nodes: Mildly enlarged AP window lymph node 11 mm not changed since 2019. Scattered small lymph nodes throughout the chest none with pathologic enlargement. Hilar structures not well evaluated. No thoracic inlet adenopathy. No thoracic  inlet adenopathy. Esophagus with mild circumferential thickening distally. Lungs/Pleura: Patchy opacities at the right lung apex with surrounding ground-glass and peripheral predominance. Some of these areas show small central solid-appearing nodules, largest measuring 4 mm (image 55, series 5) process appears confined to the right upper lobe. Airways are patent. Some distortion of the right hilum and mild narrowing of the bronchus intermedius is unchanged and may reflect mild bronchomalacia. Scattered areas of scarring in the lingula are stable. Upper Abdomen: Incidental imaging of upper abdominal contents showing no signs of acute abnormality. Musculoskeletal: No acute bone finding or destructive bone process. IMPRESSION: 1. Patchy opacities at the right lung apex with surrounding ground-glass and peripheral predominance. Process appears confined to mainly to the right upper lobe. This would be atypical for metastases. Infection with viral or atypical process is considered. Correlation with blood cultures may be helpful to exclude hematogenous process in this patient with aortic valve replacement. 2. Stable mildly enlarged AP window lymph node. Attention on follow-up 3. Mild circumferential thickening of the distal esophagus may reflect esophagitis. 4. Calcified atherosclerotic changes and three-vessel coronary artery disease with evidence of prior trans arterial aortic valve  replacement. Aortic Atherosclerosis (ICD10-I70.0). Electronically Signed   By: Zetta Bills M.D.   On: 02/10/2020 17:58   US RENAL  Result Date: 02/12/2020 CLINICAL DATA:  Acute renal failure superimposed upon chronic kidney disease, diabetes mellitus, hypertension, hyperlipidemia, horseshoe kidney EXAM: RENAL / URINARY TRACT ULTRASOUND COMPLETE COMPARISON:  CT abdomen 08/18/2018 FINDINGS: Right Kidney: Renal measurements: 14.4 x 4.9 x 6.7 cm = volume: 250 mL. Extremely poorly visualized due to cortical thinning, increased cortical echogenicity, and body habitus. No gross evidence of mass or hydronephrosis are identified on limited assessment. Left Kidney: Renal measurements: 10.0 x 4.1 x 4.9 cm = volume: 106 mL. Extremely poorly visualized due to cortical thinning, increased cortical echogenicity, and body habitus. No gross evidence of mass or hydronephrosis on limited assessment. Bladder: Decompressed by catheter, unable to evaluate Other: N/A IMPRESSION: Severe limitations of exam secondary to body habitus, bowel gas, and horseshoe anatomy. There appears to be cortical thinning and increased cortical echogenicity of the renal moieties bilaterally without gross evidence of mass or hydronephrosis on limited assessment. Electronically Signed   By: Lavonia Dana M.D.   On: 02/12/2020 14:59   DG Chest Portable 1 View  Result Date: 02/02/2020 CLINICAL DATA:  Wheezing EXAM: PORTABLE CHEST 1 VIEW COMPARISON:  08/29/2019 FINDINGS: Stable mild cardiomegaly status post TAVR. Calcific aortic knob. Bilateral interstitial opacities, right worse than left with areas of subtle nodularity most prominent in the right upper lobe. No pleural effusion or pneumothorax. IMPRESSION: Bilateral interstitial opacities, right worse than left with areas of subtle nodularity most prominent in the right upper lobe. Findings could reflect atypical or viral pneumonia. Radiographic follow-up to resolution is recommended. Electronically  Signed   By: Davina Poke D.O.   On: 02/03/2020 14:46   ECHOCARDIOGRAM COMPLETE  Result Date: 02/10/2020    ECHOCARDIOGRAM REPORT   Patient Name:   [name] [name] Date of Exam: 02/10/2020 Medical Rec #:  [id number]         Height:       57.0 in Accession #:    [id number]        Weight:       150.4 lb Date of Birth:  [birth year]         BSA:          1.593 m Patient Age:    6 years  BP:           128/61 mmHg Patient Gender: F                 HR:           75 bpm. Exam Location:  Inpatient Procedure: 2D Echo and Strain Analysis Indications:    Aortic Stenosis 424.1 / 135.0  History:        Patient has prior history of Echocardiogram examinations, most                 recent 01/11/2019. Stroke, Signs/Symptoms:Shortness of Breath;                 Risk Factors:Hypertension, Diabetes and Dyslipidemia. Pneumonia.                 Chronic kidney disease. Bilateral carotid stenosis.                 Aortic Valve: 23 mm Edwards Sapien prosthetic, stented (TAVR)                 valve is present in the aortic position. Procedure Date:                 01/10/2019.  Sonographer:    Darlina Sicilian RDCS Referring Phys: [id number] [place]  1. Left ventricular ejection fraction, by estimation, is 55 to 60%. The left ventricle has normal function. The left ventricle has no regional wall motion abnormalities. There is moderate concentric left ventricular hypertrophy. Left ventricular diastolic parameters are consistent with Grade I diastolic dysfunction (impaired relaxation). Elevated left ventricular end-diastolic pressure.  2. Right ventricular systolic function is normal. The right ventricular size is normal.  3. The mitral valve is normal in structure and function. Mild mitral annular calcification. Mild mitral valve regurgitation. No evidence of mitral stenosis.  4. The aortic valve has been repaired/replaced. There is trivial perivalvular AI ipresent. No aortic stenosis is present. There is a 23 mm  Edwards Sapien prosthetic (TAVR) valve present in the aortic position. that is funcitoning normaly. Aortic valve mean gradient measures 12.0 mmHg. Aortic valve peak gradient measures 21.8 mmHg. Aortic valve area, by VTI measures 1.66 cm. Dimensionless index is 0.44 and the AVA is calculated to be 1.51cm2.  5. The inferior vena cava is normal in size with greater than 50% respiratory variability, suggesting right atrial pressure of 3 mmHg.  6. Compared to prior echo, the mean TAVR gradient has increased from 11 to 27mmhg and AVG is 1.51cm2 (was 1.88cm2 on last echo). FINDINGS  Left Ventricle: Left ventricular ejection fraction, by estimation, is 55 to 60%. The left ventricle has normal function. The left ventricle has no regional wall motion abnormalities. The left ventricular internal cavity size was normal in size. There is  moderate concentric left ventricular hypertrophy. Left ventricular diastolic parameters are consistent with Grade I diastolic dysfunction (impaired relaxation). Elevated left ventricular end-diastolic pressure. Right Ventricle: The right ventricular size is normal. No increase in right ventricular wall thickness. Right ventricular systolic function is normal. Left Atrium: Left atrial size was normal in size. Right Atrium: Right atrial size was normal in size. Pericardium: Trivial pericardial effusion is present. The pericardial effusion is posterior to the left ventricle. Mitral Valve: The mitral valve is normal in structure and function. Normal mobility of the mitral valve leaflets. Mild mitral annular calcification. Mild mitral valve regurgitation. No evidence of mitral valve stenosis. Tricuspid Valve: The tricuspid valve is normal in structure.  Tricuspid valve regurgitation is not demonstrated. No evidence of tricuspid stenosis. Aortic Valve: The aortic valve has been repaired/replaced. Aortic valve regurgitation trivial perivalvular AI is present. No aortic stenosis is present. Aortic  valve mean gradient measures 12.0 mmHg. Aortic valve peak gradient measures 21.8 mmHg. Aortic valve area, by VTI measures 1.66 cm. There is a 23 mm Edwards Sapien prosthetic, stented (TAVR) valve present in the aortic position. Procedure Date: 01/10/2019. Pulmonic Valve: The pulmonic valve was normal in structure. Pulmonic valve regurgitation is not visualized. No evidence of pulmonic stenosis. Aorta: The aortic root is normal in size and structure. Venous: The inferior vena cava is normal in size with greater than 50% respiratory variability, suggesting right atrial pressure of 3 mmHg. IAS/Shunts: No atrial level shunt detected by color flow Doppler.  LEFT VENTRICLE PLAX 2D LVIDd:         4.79 cm      Diastology LVIDs:         3.43 cm      LV e' lateral:   4.35 cm/s LV PW:         1.22 cm      LV E/e' lateral: 29.7 LV IVS:        1.34 cm      LV e' medial:    3.00 cm/s LVOT diam:     2.20 cm      LV E/e' medial:  43.0 LV SV:         82 LV SV Index:   52 LVOT Area:     3.80 cm  LV Volumes (MOD) LV vol d, MOD A2C: 99.5 ml LV vol d, MOD A4C: 125.0 ml LV vol s, MOD A2C: 50.2 ml LV vol s, MOD A4C: 56.4 ml LV SV MOD A2C:     49.3 ml LV SV MOD A4C:     125.0 ml LV SV MOD BP:      58.3 ml RIGHT VENTRICLE RV S prime:     11.20 cm/s TAPSE (M-mode): 1.6 cm LEFT ATRIUM             Index       RIGHT ATRIUM           Index LA diam:        3.60 cm 2.26 cm/m  RA Area:     15.20 cm LA Vol (A2C):   55.7 ml 34.96 ml/m RA Volume:   34.70 ml  21.78 ml/m LA Vol (A4C):   49.6 ml 31.13 ml/m LA Biplane Vol: 53.2 ml 33.39 ml/m  AORTIC VALVE AV Area (Vmax):    1.51 cm AV Area (Vmean):   1.61 cm AV Area (VTI):     1.66 cm AV Vmax:           233.50 cm/s AV Vmean:          162.000 cm/s AV VTI:            0.496 m AV Peak Grad:      21.8 mmHg AV Mean Grad:      12.0 mmHg LVOT Vmax:         92.50 cm/s LVOT Vmean:        68.400 cm/s LVOT VTI:          0.217 m LVOT/AV VTI ratio: 0.44  AORTA Ao Root diam: 3.10 cm Ao Asc diam:  3.00 cm  MITRAL VALVE MV Area (PHT): 3.21 cm     SHUNTS MV Decel Time: 236 msec  Systemic VTI:  0.22 m MV E velocity: 129.00 cm/s  Systemic Diam: 2.20 cm MV A velocity: 144.00 cm/s MV E/A ratio:  0.90 Fransico Him MD Electronically signed by Fransico Him MD Signature Date/Time: 02/10/2020/5:45:00 PM    Final     Irwin Brakeman, MD  How to contact the [facility] Attending or Consulting provider [place] or covering provider during after hours [place], for this patient?  1. Check the care team in [facility] and look for a) attending/consulting [facility] provider listed and b) the [facility] team listed 2. Log into [email or web address] and use [place]'s universal password to access. If you do not have the password, please contact the hospital operator. 3. Locate the [facility] provider you are looking for under [facility] and page to a number that you can be directly reached. 4. If you still have difficulty reaching the provider, please page the [facility] (Director on Call) for the Hospitalists listed on amion for assistance.   If 7PM-7AM, please contact night-coverage [email or web address] Password [email or web address] 02/15/2020, 12:38 PM   LOS: 6 days

## 2020-02-15 NOTE — TOC Progression Note (Signed)
Transition of Care [facility]) - Progression Note    Patient Details  Name: ABIHA LUKEHART MRN: [id number] Date of Birth: 1946-01-09  Transition of Care [facility]) CM/SW Contact  Ihor Gully, LCSW Phone Number: 02/15/2020, 2:14 PM  Clinical Narrative:    Facility has [place]. Attending aware. Per attending, nephrology wants patient recheck patient's kidney's 3/5. Mardene Celeste at [facility] notified of expected d/c tomorrow.    Expected Discharge Plan: Skilled Nursing Facility Barriers to Discharge: Continued Medical Work up  Expected Discharge Plan and Services Expected Discharge Plan: [place]         Expected Discharge Date: 02/15/20                                     Social Determinants of Health (SDOH) Interventions    Readmission Risk Interventions Readmission Risk Prevention Plan 02/12/2020  Transportation Screening Complete  PCP or Specialist Appt within 3-5 Days Not Complete  HRI or [place] Complete  Social Work Consult for [place] Planning/Counseling Complete  Palliative Care Screening Not Complete  Medication Review [employer]) Complete  Some recent data might be hidden

## 2020-02-15 NOTE — Progress Notes (Signed)
Patient ID: [name] [name], female   DOB: 04-17-1946, 74 y.o.   MRN: [id number] S: More awake and alert this am.  No c/o sob today O:BP (!) 142/74 (BP Location: Left Arm)   Pulse 75   Temp 98.6 F (37 C) (Oral)   Resp (!) 21   Ht 4\' 9"  (1.448 m)   Wt 76.6 kg   SpO2 96%   BMI 36.54 kg/m   Intake/Output Summary (Last 24 hours) at 02/15/2020 1206 Last data filed at 02/15/2020 0500 Gross per 24 hour  Intake 360 ml  Output 1300 ml  Net -940 ml   Intake/Output: I/O last 3 completed shifts: In: 600 [P.O.:600] Out: 2100 [Urine:2100]  Intake/Output this shift:  No intake/output data recorded. Weight change: -0.1 kg Gen: frail elderly WF with myoclonic jerking CVS: no rub Resp: bilateral exp wheezes and occ rhonchi Abd: benign Ext: trace pretibial edema  Recent Labs  Lab 01/26/2020 1443 02/10/20 0224 02/11/20 0540 02/12/20 0416 02/13/20 0503 02/14/20 0515 02/15/20 0412  NA 138 135 132* 127* 128* 131* 134*  K 3.2* 3.8 4.8 5.2* 5.0 5.0 4.8  CL 99 97* 96* 93* 95* 98 101  CO2 27 24 26 22  20* 21* 20*  GLUCOSE 134* 458* 292* 260* 208* 217* 203*  BUN 23 25* 43* 59* 79* 93* 101*  CREATININE 2.01* 1.99* 2.34* 2.75* 3.05* 3.56* 3.52*  ALBUMIN  --   --   --   --  3.5 3.4* 3.4*  CALCIUM 8.9 8.5* 8.5* 7.7* 7.7* 7.5* 7.7*  PHOS  --   --   --   --  5.8* 6.3* 6.4*   Liver Function Tests: Recent Labs  Lab 02/13/20 0503 02/14/20 0515 02/15/20 0412  ALBUMIN 3.5 3.4* 3.4*   No results for input(s): LIPASE, AMYLASE in the last 168 hours. No results for input(s): AMMONIA in the last 168 hours. CBC: Recent Labs  Lab 02/10/2020 1443 02/03/2020 1443 02/10/20 0224 02/11/20 0540 02/15/20 0412  WBC 11.7*   < > 11.2* 16.6* 12.6*  NEUTROABS 8.4*  --   --   --   --   HGB 11.7*   < > 11.3* 10.8* 10.8*  HCT 36.0   < > 35.1* 33.9* 33.8*  MCV 88.2  --  87.8 89.2 88.0  PLT 366   < > 370 378 389   < > = values in this interval not displayed.   Cardiac Enzymes: Recent Labs  Lab 02/12/20 1354   CKTOTAL 31*   CBG: Recent Labs  Lab 02/14/20 1113 02/14/20 1603 02/14/20 2119 02/15/20 0741 02/15/20 1142  GLUCAP 217* 142* 226* 237* 179*    Iron Studies: No results for input(s): IRON, TIBC, TRANSFERRIN, FERRITIN in the last 72 hours. Studies/Results: No results found. Marland Kitchen amLODipine  10 mg Oral Daily  . arformoterol  15 mcg Nebulization BID  . aspirin EC  81 mg Oral Daily  . budesonide (PULMICORT) nebulizer solution  0.5 mg Nebulization BID  . gabapentin  300 mg Oral BID  . heparin  5,000 Units Subcutaneous Q8H  . insulin aspart  0-20 Units Subcutaneous TID WC  . insulin aspart  0-5 Units Subcutaneous QHS  . insulin aspart  14 Units Subcutaneous TID WC  . insulin glargine  35 Units Subcutaneous Daily  . ipratropium-albuterol  3 mL Nebulization Q6H WA  . levothyroxine  125 mcg Oral Daily  . mouth rinse  15 mL Mouth Rinse BID  . methylPREDNISolone (SOLU-MEDROL) injection  60 mg Intravenous Q6H  .  multivitamin-lutein  1 capsule Oral Daily  . simvastatin  20 mg Oral Daily  . sodium chloride flush  3 mL Intravenous Q12H  . timolol  1 drop Both Eyes BID  . vitamin B-12  1,000 mcg Oral Daily    BMET    Component Value Date/Time   NA 134 (L) 02/15/2020 0412   NA 139 08/04/2018 1658   K 4.8 02/15/2020 0412   CL 101 02/15/2020 0412   CO2 20 (L) 02/15/2020 0412   GLUCOSE 203 (H) 02/15/2020 0412   BUN 101 (H) 02/15/2020 0412   BUN 21 08/04/2018 1658   CREATININE 3.52 (H) 02/15/2020 0412   CALCIUM 7.7 (L) 02/15/2020 0412   GFRNONAA 12 (L) 02/15/2020 0412   GFRAA 14 (L) 02/15/2020 0412   CBC    Component Value Date/Time   WBC 12.6 (H) 02/15/2020 0412   RBC 3.84 (L) 02/15/2020 0412   HGB 10.8 (L) 02/15/2020 0412   HGB 13.0 08/04/2018 1658   HCT 33.8 (L) 02/15/2020 0412   HCT 38.6 08/04/2018 1658   PLT 389 02/15/2020 0412   PLT 284 08/04/2018 1658   MCV 88.0 02/15/2020 0412   MCV 87 08/04/2018 1658   MCH 28.1 02/15/2020 0412   MCHC 32.0 02/15/2020 0412   RDW  14.7 02/15/2020 0412   RDW 14.9 08/04/2018 1658   LYMPHSABS 2.1 01/25/2020 1443   MONOABS 0.8 02/04/2020 1443   EOSABS 0.3 02/08/2020 1443   BASOSABS 0.1 01/21/2020 1443   Assessment/Plan:  1. Non-oliguric, AKI/CKD stage 3b- renal US limited but no obstruction.  UA with significant proteinuria.  Presumably due to ischemic ATN, however her BUN/Cr continue to climb and is now with myoclonic jerks (likely from gabapentin toxicity but also could be uremia).  If BUN/Cr continue to climb will need to consult surgery for HD catheter placement and initiate HD.  I discussed this with [name] [name] and she is amenable "if I need it" 1. Cr improving but BUN climbing 2. Recommend stopping solumedrol as this is contributing to her azotemia and she has already received it for 5 days. 3. Continue to hold off on HD but will cont to follow closely 4. Myoclonic jerking may be due to gabapentin and will stop it and follow.  5. Would like to avoid initiating HD as she is a marginal HD candidate given poor functional status and multiple co-morbidities. 2. Acute on chronic hypoxic respiratory failure- due to PNA and underlying COPD exacerbation.  Continue with nebs and IV solumedrol (which likely contributes to azotemia).  Has been on solumedrol for 5 days. 3. CAP- right lung- on ceftriaxone and azythromycin 4. Chronic diastolic CHF- some edema of lower ext despite being negative with I's/O's.  Cont to follow but may need HD or addition of diuretics 5. Proteinuria- will quantify 6. Severe aortic stenosis s/p TAVR 01/10/19 7. DM type 2  Donetta Potts, MD [facility] [phone]

## 2020-02-16 LAB — GLUCOSE, CAPILLARY
Glucose-Capillary: 80 mg/dL (ref 70–99)
Glucose-Capillary: 130 mg/dL — ABNORMAL HIGH (ref 70–99)
Glucose-Capillary: 117 mg/dL — ABNORMAL HIGH (ref 70–99)
Glucose-Capillary: 82 mg/dL (ref 70–99)
Glucose-Capillary: 76 mg/dL (ref 70–99)

## 2020-02-16 LAB — RENAL FUNCTION PANEL
Albumin: 3 g/dL — ABNORMAL LOW (ref 3.5–5.0)
Anion gap: 10 (ref 5–15)
BUN: 103 mg/dL — ABNORMAL HIGH (ref 8–23)
CO2: 22 mmol/L (ref 22–32)
Calcium: 7.6 mg/dL — ABNORMAL LOW (ref 8.9–10.3)
Chloride: 106 mmol/L (ref 98–111)
Creatinine, Ser: 3.23 mg/dL — ABNORMAL HIGH (ref 0.44–1.00)
GFR calc Af Amer: 16 mL/min — ABNORMAL LOW (ref >60)
GFR calc non Af Amer: 13 mL/min — ABNORMAL LOW (ref >60)
Glucose, Bld: 82 mg/dL (ref 70–99)
Phosphorus: 6.4 mg/dL — ABNORMAL HIGH (ref 2.5–4.6)
Potassium: 4.7 mmol/L (ref 3.5–5.1)
Sodium: 138 mmol/L (ref 135–145)

## 2020-02-16 LAB — IMMUNOFIXATION, URINE

## 2020-02-16 MED ORDER — ALPRAZOLAM 0.25 MG PO TABS: 0.2500 mg | ORAL_TABLET | Freq: Every evening | ORAL | Status: DC | PRN

## 2020-02-16 MED ORDER — INSULIN GLARGINE 100 UNIT/ML ~~LOC~~ SOLN
10.0000 [IU] | Freq: Every day | SUBCUTANEOUS | Status: DC
  Filled 2020-02-16 (×3): qty 0.1

## 2020-02-16 NOTE — Progress Notes (Signed)
Patient ID: [name] [name], female   DOB: Sep 04, 1946, 74 y.o.   MRN: [id number] S: Feels well no new complaints.  O:BP (!) 151/67 (BP Location: Left Arm)   Pulse 68   Temp 98.2 F (36.8 C) (Oral)   Resp 18   Ht 4\' 9"  (1.448 m)   Wt 77.7 kg   SpO2 93%   BMI 37.07 kg/m   Intake/Output Summary (Last 24 hours) at 02/16/2020 1046 Last data filed at 02/16/2020 1002 Gross per 24 hour  Intake 3 ml  Output 950 ml  Net -947 ml   Intake/Output: I/O last 3 completed shifts: In: -  Out: 2250 [Urine:2250]  Intake/Output this shift:  Total I/O In: 3 [I.V.:3] Out: -  Weight change: 1.1 kg Gen: NAD CVS: no rub Resp: scattered rhonchi and exp wheezes Abd:+BS, soft, NT/ND Ext: trace pretibial edema Neuro: asterixis present but improved from yesterday  Recent Labs  Lab 02/10/20 0224 02/11/20 0540 02/12/20 0416 02/13/20 0503 02/14/20 0515 02/15/20 0412 02/16/20 0408  NA 135 132* 127* 128* 131* 134* 138  K 3.8 4.8 5.2* 5.0 5.0 4.8 4.7  CL 97* 96* 93* 95* 98 101 106  CO2 24 26 22  20* 21* 20* 22  GLUCOSE 458* 292* 260* 208* 217* 203* 82  BUN 25* 43* 59* 79* 93* 101* 103*  CREATININE 1.99* 2.34* 2.75* 3.05* 3.56* 3.52* 3.23*  ALBUMIN  --   --   --  3.5 3.4* 3.4* 3.0*  CALCIUM 8.5* 8.5* 7.7* 7.7* 7.5* 7.7* 7.6*  PHOS  --   --   --  5.8* 6.3* 6.4* 6.4*   Liver Function Tests: Recent Labs  Lab 02/14/20 0515 02/15/20 0412 02/16/20 0408  ALBUMIN 3.4* 3.4* 3.0*   No results for input(s): LIPASE, AMYLASE in the last 168 hours. No results for input(s): AMMONIA in the last 168 hours. CBC: Recent Labs  Lab 01/28/2020 1443 02/11/2020 1443 02/10/20 0224 02/11/20 0540 02/15/20 0412  WBC 11.7*   < > 11.2* 16.6* 12.6*  NEUTROABS 8.4*  --   --   --   --   HGB 11.7*   < > 11.3* 10.8* 10.8*  HCT 36.0   < > 35.1* 33.9* 33.8*  MCV 88.2  --  87.8 89.2 88.0  PLT 366   < > 370 378 389   < > = values in this interval not displayed.   Cardiac Enzymes: Recent Labs  Lab 02/12/20 1354   CKTOTAL 31*   CBG: Recent Labs  Lab 02/15/20 0741 02/15/20 1142 02/15/20 1644 02/15/20 2008 02/16/20 0743  GLUCAP 237* 179* 137* 124* 80    Iron Studies: No results for input(s): IRON, TIBC, TRANSFERRIN, FERRITIN in the last 72 hours. Studies/Results: No results found. Marland Kitchen amLODipine  10 mg Oral Daily  . arformoterol  15 mcg Nebulization BID  . aspirin EC  81 mg Oral Daily  . budesonide (PULMICORT) nebulizer solution  0.5 mg Nebulization BID  . heparin  5,000 Units Subcutaneous Q8H  . insulin aspart  0-20 Units Subcutaneous TID WC  . insulin aspart  0-5 Units Subcutaneous QHS  . insulin aspart  8 Units Subcutaneous TID WC  . insulin glargine  20 Units Subcutaneous Daily  . ipratropium-albuterol  3 mL Nebulization TID  . levothyroxine  125 mcg Oral Daily  . mouth rinse  15 mL Mouth Rinse BID  . multivitamin-lutein  1 capsule Oral Daily  . simvastatin  20 mg Oral Daily  . sodium chloride flush  3 mL  Intravenous Q12H  . timolol  1 drop Both Eyes BID  . vitamin B-12  1,000 mcg Oral Daily    BMET    Component Value Date/Time   NA 138 02/16/2020 0408   NA 139 08/04/2018 1658   K 4.7 02/16/2020 0408   CL 106 02/16/2020 0408   CO2 22 02/16/2020 0408   GLUCOSE 82 02/16/2020 0408   BUN 103 (H) 02/16/2020 0408   BUN 21 08/04/2018 1658   CREATININE 3.23 (H) 02/16/2020 0408   CALCIUM 7.6 (L) 02/16/2020 0408   GFRNONAA 13 (L) 02/16/2020 0408   GFRAA 16 (L) 02/16/2020 0408   CBC    Component Value Date/Time   WBC 12.6 (H) 02/15/2020 0412   RBC 3.84 (L) 02/15/2020 0412   HGB 10.8 (L) 02/15/2020 0412   HGB 13.0 08/04/2018 1658   HCT 33.8 (L) 02/15/2020 0412   HCT 38.6 08/04/2018 1658   PLT 389 02/15/2020 0412   PLT 284 08/04/2018 1658   MCV 88.0 02/15/2020 0412   MCV 87 08/04/2018 1658   MCH 28.1 02/15/2020 0412   MCHC 32.0 02/15/2020 0412   RDW 14.7 02/15/2020 0412   RDW 14.9 08/04/2018 1658   LYMPHSABS 2.1 01/26/2020 1443   MONOABS 0.8 01/19/2020 1443   EOSABS  0.3 01/28/2020 1443   BASOSABS 0.1 01/20/2020 1443   Assessment/Plan:  1. Non-oliguric,AKI/CKD stage 3b- renal US limited but no obstruction. UA with significant proteinuria. Presumably due to ischemic ATN, however her BUN/Cr continue to climb and is now with myoclonic jerks (likely from gabapentin toxicity but also could be uremia). If BUN/Cr continue to climb will need to consult surgery for HD catheter placement and initiate HD. I discussed this with [name] [name] and she is amenable "if I need it" 1. Cr continues to slowly improve and BUN has reached a plateau. 2. solumedrol stopped yesterday as it was contributing to her azotemia 3. Continue to hold off on HD but will cont to follow closely 4. Myoclonic jerking likely due to gabapentin toxicity as dose was too high for her level of renal function.  It was stopped 02/15/20 and her jerking and asterixis are improving.  5. Would like to avoid initiating HD as she is a marginal HD candidate given poor functional status and multiple co-morbidities. 2. Acute on chronic hypoxic respiratory failure- due to PNA and underlying COPD exacerbation. Continue with nebs and IV solumedrol (which likely contributes to azotemia). Has completed 5 days of IV solumedrol. 3. CAP- right lung- on ceftriaxone and azythromycin 4. Chronic diastolic CHF- some edema of lower ext despite being negative with I's/O's. Cont to hold lasix for now but may need to resume  5. Proteinuria- will quantify 6. Severe aortic stenosis s/p TAVR 01/10/19 7. DM type 2- off of jardiance and metformin due to AKI 8. Neuropathy- gabapentin on hold due to AKI.  Max dose should be no more than 300 mg/day   [name] Potts, MD [facility] [phone]

## 2020-02-16 NOTE — Care Management Important Message (Signed)
Important Message  Patient Details  Name: [name] [name] MRN: [id number] Date of Birth: 06/08/1946   Medicare Important Message Given:  Yes     Tommy Medal 02/16/2020, 3:36 PM

## 2020-02-16 NOTE — Progress Notes (Signed)
PROGRESS NOTE  [name] [name] BHA:[id number] DOB: 08-Jul-1946 DOA: 02/05/2020 PCP: [employer] [employer], MD   Brief History: 74 year old female with a history of diastolic CHF, COPD, CKD stage III, diabetes mellitus type 2, hypertension, severe aortic stenosis status post TAVR 9/73/5329, embolic stroke presenting with 3-day history of generalized weakness, coughing, shortness of breath, and wheezing. The patient also had some unsteady gait and fell. She did not lose consciousness. The patient has not been able to use her nebulizer machine at home secondary to malfunctioning. She denies any fevers, chills, chest pain, vomiting, diarrhea, abdominal pain. Notably, the patient had a hospitalization from 01/10/2019 to2/02/2019 where she underwent TAVR. She had postoperative acute on chronic renal failure. Her postoperative course was also complicated by bilateral lower extremity weakness and myoclonus.She was seen by neurology at that time, and it was felt to be secondary to gabapentin toxicity in the setting of her acute on chronic renal failure. MRI of the brain did show cardioembolic stroke. CTA of the head and neck was negative for LVO. But it did show advanced intracranial atherosclerosis with moderate to severe right and moderate left intracranial ICA stenosis. The patient was subsequently discharged to [facility].  In the emergency department, the patient was afebrile hemodynamically stable with oxygen saturation 95% room air. WBC 11.7. Serum creatinine was 2.01. Chest x-ray showed bilateral interstitial opacities. The patient was started on Solu-Medrol for COPD exacerbation.  Assessment/Plan: Acute Respiratory Failure with hypoxia -due to PNA and COPD exacerbation -stable on 2L -wean oxygen as able  COPD exacerbation -ContinuePulmicort -Completed IV Solu-Medrol -Continue duonebs -added brovana  R-lungopacity/CAP -Completed course of empiric ceftriaxone and  azithromycin -Procalcitonin<0.10 -CT chest--Patchy opacities at the right lung apex with surrounding ground-glass and peripheral predominance; bronchomalacia; circumferential esophageal thickening  Acute on chronic renal failure CKD stage IIIb -Baseline creatinine 1.5-1.6, has been >3 for last several days, nephrology following, may need temp HD. -Suspect the patient may have a degree of progression of her underlying renal disease -serum creatinine continues to rise -nephrology consulted, may require HD if no improvement -temporarily holding diuretics  -renal US--neg hydronephrosis  Chronic diastolic CHF -Daily weights -appears clinically euvolemic -2/27 echo--EF 55-60%, no WMA, G1DD, mild MR, s/pTAVR  Uncontrolled diabetes mellitus type 2 with hyperglycemia -2/27/21Hemoglobin A1c--6.6 -increaseLantus 25 units daily -NovoLog sliding scale -IncreaseNovoLog14units with meals -Holding glipizide, Jardiance, Metformin -elevated cbg resolved now.   CBG (last 3)  Recent Labs    02/16/20 1103 02/16/20 1234 02/16/20 1558  GLUCAP 130* 117* 82    Hypertension -Continue amlodipine -controlled  Hypothyroidism -TSH 32.53 -pt reports not missing doses of levothyroxine, dose increased in hospital  Hyperlipidemia -Continue statin  Frequent falls -PT evaluation-->SNF -orthostatic vital signs neg  History of stroke -Continue aspirin  History of TAVR -Underwent TAVR 01/10/2019 -finished 6 months DAPT  Disposition Plan: Pt from home but has become too weak and deconditioned to return home, agreed to SNF rehab, continues to require inpatient care for IV steroids and continues to have acute on chronic respiratory failure requiring acute treatments, also renal function continues to decline and may require hemodialysis if no improvement, not ready to discharge today due primary to renal issue, otherwise ready for SNF   Family Communication:Daughter updated on  3/2, updated again 3/4  Consultants:none  Code Status: FULL   DVT Prophylaxis: Harvest Heparin   Procedures: As Listed in Progress Note Above  Antibiotics: Ceftriaxone 2/26>>> azithro 2/26>>>  Subjective: Pt denies SOB today.  No CP.       Objective: Vitals:   02/16/20 0929 02/16/20 1200 02/16/20 1300 02/16/20 1342  BP:  (!) 144/62 (!) 144/62   Pulse:  61 61   Resp:  16 16   Temp:   98 F (36.7 C)   TempSrc:   Oral   SpO2: 93% 100% 100% 95%  Weight:      Height:        Intake/Output Summary (Last 24 hours) at 02/16/2020 1754 Last data filed at 02/16/2020 1600 Gross per 24 hour  Intake 843 ml  Output 1950 ml  Net -1107 ml   Weight change: 1.1 kg Exam:   General:  Awake, alert, cooperative, pleasant.   HEENT: NCAT. Neck supple. No JVD.   Cardiovascular: normal s1,s2 sounds.   Respiratory: rare expiratory wheezes heard.   Abdomen: Soft/+BS, non tender, non distended, no guarding  Extremities: Non pitting edema, No lymphangitis, No petechiae, No rashes, no synovitis  Data Reviewed: I have personally reviewed following labs and imaging studies Basic Metabolic Panel: Recent Labs  Lab 02/10/20 0224 02/10/20 0918 02/11/20 0540 02/11/20 0540 02/12/20 0416 02/13/20 0503 02/14/20 0515 02/15/20 0412 02/16/20 0408  NA   < >  --  132*   < > 127* 128* 131* 134* 138  K   < >  --  4.8   < > 5.2* 5.0 5.0 4.8 4.7  CL   < >  --  96*   < > 93* 95* 98 101 106  CO2   < >  --  26   < > 22 20* 21* 20* 22  GLUCOSE   < >  --  292*   < > 260* 208* 217* 203* 82  BUN   < >  --  43*   < > 59* 79* 93* 101* 103*  CREATININE   < >  --  2.34*   < > 2.75* 3.05* 3.56* 3.52* 3.23*  CALCIUM   < >  --  8.5*   < > 7.7* 7.7* 7.5* 7.7* 7.6*  MG  --  2.5* 2.6*  --   --   --   --   --   --   PHOS  --   --   --   --   --  5.8* 6.3* 6.4* 6.4*   < > = values in this interval not displayed.   Liver Function Tests: Recent Labs  Lab 02/13/20 0503 02/14/20 0515 02/15/20 0412  02/16/20 0408  ALBUMIN 3.5 3.4* 3.4* 3.0*   No results for input(s): LIPASE, AMYLASE in the last 168 hours. No results for input(s): AMMONIA in the last 168 hours. Coagulation Profile: No results for input(s): INR, PROTIME in the last 168 hours. CBC: Recent Labs  Lab 02/10/20 0224 02/11/20 0540 02/15/20 0412  WBC 11.2* 16.6* 12.6*  HGB 11.3* 10.8* 10.8*  HCT 35.1* 33.9* 33.8*  MCV 87.8 89.2 88.0  PLT 370 378 389   Cardiac Enzymes: Recent Labs  Lab 02/12/20 1354  CKTOTAL 31*   BNP: Invalid input(s): POCBNP CBG: Recent Labs  Lab 02/15/20 2008 02/16/20 0743 02/16/20 1103 02/16/20 1234 02/16/20 1558  GLUCAP 124* 80 130* 117* 82   HbA1C: No results for input(s): HGBA1C in the last 72 hours. Urine analysis:    Component Value Date/Time   COLORURINE STRAW (A) 02/14/2020 1159   APPEARANCEUR CLEAR 02/14/2020 1159   LABSPEC 1.011 02/14/2020 1159   PHURINE 6.0 02/14/2020 1159   GLUCOSEU 150 (A) 02/14/2020  [place] 02/14/2020 [place] 02/14/2020 1159   KETONESUR NEGATIVE 02/14/2020 1159   PROTEINUR 100 (A) 02/14/2020 1159   UROBILINOGEN 0.2 09/23/2013 1926   NITRITE NEGATIVE 02/14/2020 1159   LEUKOCYTESUR NEGATIVE 02/14/2020 1159   Recent Results (from the past 240 hour(s))  SARS CORONAVIRUS 2 (TAT 6-24 HRS) Nasopharyngeal Nasopharyngeal Swab     Status: None   Collection Time: 02/03/2020  7:58 PM   Specimen: Nasopharyngeal Swab  Result Value Ref Range Status   SARS Coronavirus 2 NEGATIVE NEGATIVE Final    Comment: (NOTE) SARS-CoV-2 target nucleic acids are NOT DETECTED. The SARS-CoV-2 RNA is generally detectable in upper and lower respiratory specimens during the acute phase of infection. Negative results do not preclude SARS-CoV-2 infection, do not rule out co-infections with other pathogens, and should not be used as the sole basis for treatment or other patient management decisions. Negative results must be combined with  clinical observations, patient history, and epidemiological information. The expected result is Negative. Fact Sheet for Patients: SugarRoll.be Fact Sheet for Healthcare Providers: https://www.woods-mathews.com/ This test is not yet approved or cleared by the [place] FDA and  has been authorized for detection and/or diagnosis of SARS-CoV-2 by FDA under an Emergency Use Authorization (EUA). This EUA will remain  in effect (meaning this test can be used) for the duration of the COVID-19 declaration under Section 56 4(b)(1) of the Act, 21 U.S.C. section 360bbb-3(b)(1), unless the authorization is terminated or revoked sooner. Performed at [facility], [place] [address], [place], [place] [address]   Respiratory Panel by PCR     Status: Abnormal   Collection Time: 02/11/20  6:10 PM   Specimen: Nasopharyngeal Swab; Respiratory  Result Value Ref Range Status   Adenovirus NOT DETECTED NOT DETECTED Final   Coronavirus 229E NOT DETECTED NOT DETECTED Final    Comment: (NOTE) The Coronavirus on the Respiratory Panel, DOES NOT test for the novel  Coronavirus (2019 nCoV)    Coronavirus HKU1 NOT DETECTED NOT DETECTED Final   Coronavirus NL63 NOT DETECTED NOT DETECTED Final   Coronavirus OC43 NOT DETECTED NOT DETECTED Final   Metapneumovirus NOT DETECTED NOT DETECTED Final   Rhinovirus / Enterovirus DETECTED (A) NOT DETECTED Final   Influenza A NOT DETECTED NOT DETECTED Final   Influenza B NOT DETECTED NOT DETECTED Final   Parainfluenza Virus 1 NOT DETECTED NOT DETECTED Final   Parainfluenza Virus 2 NOT DETECTED NOT DETECTED Final   Parainfluenza Virus 3 NOT DETECTED NOT DETECTED Final   Parainfluenza Virus 4 NOT DETECTED NOT DETECTED Final   Respiratory Syncytial Virus NOT DETECTED NOT DETECTED Final   Bordetella pertussis NOT DETECTED NOT DETECTED Final   Chlamydophila pneumoniae NOT DETECTED NOT DETECTED Final   Mycoplasma pneumoniae NOT  DETECTED NOT DETECTED Final    Comment: Performed at [facility], [place]. [address], [place], [place] [address]  Expectorated sputum assessment w rflx to resp cult     Status: None (Preliminary result)   Collection Time: 02/12/20  2:27 PM   Specimen: Sputum  Result Value Ref Range Status   Specimen Description SPUTUM  Final   Special Requests NONE  Final   Sputum evaluation   Final    THIS SPECIMEN IS ACCEPTABLE FOR SPUTUM CULTURE Performed at [facility] Performed at [facility], [address] [place], [place] [address]    Report Status PENDING  Incomplete  Culture, respiratory     Status: None  Collection Time: 02/12/20  2:27 PM   Specimen: SPU  Result Value Ref Range Status   Specimen Description   Final    SPUTUM Performed at [facility], [address] [place], [place] [address]    Special Requests   Final    NONE Reflexed from [id number] Performed at [facility], [address], [place], [place] [address]    Gram Stain NO WBC SEEN RARE GRAM POSITIVE COCCI   Final   Culture   Final    Consistent with normal respiratory flora. Performed at [facility], [place] [address], [place], [place] [address]    Report Status 02/14/2020 FINAL  Final  SARS CORONAVIRUS 2 (TAT 6-24 HRS) Nasopharyngeal Nasopharyngeal Swab     Status: None   Collection Time: 02/14/20 11:57 AM   Specimen: Nasopharyngeal Swab  Result Value Ref Range Status   SARS Coronavirus 2 NEGATIVE NEGATIVE Final    Comment: (NOTE) SARS-CoV-2 target nucleic acids are NOT DETECTED. The SARS-CoV-2 RNA is generally detectable in upper and lower respiratory specimens during the acute phase of infection. Negative results do not preclude SARS-CoV-2 infection, do not rule out co-infections with other pathogens, and should not be used as the sole basis for treatment or other patient management decisions. Negative results must be combined with clinical observations, patient history, and epidemiological  information. The expected result is Negative. Fact Sheet for Patients: SugarRoll.be Fact Sheet for Healthcare Providers: https://www.woods-mathews.com/ This test is not yet approved or cleared by the [place] FDA and  has been authorized for detection and/or diagnosis of SARS-CoV-2 by FDA under an Emergency Use Authorization (EUA). This EUA will remain  in effect (meaning this test can be used) for the duration of the COVID-19 declaration under Section 56 4(b)(1) of the Act, 21 U.S.C. section 360bbb-3(b)(1), unless the authorization is terminated or revoked sooner. Performed at [facility], [place] [address], [place], [place] [address]      Scheduled Meds: . amLODipine  10 mg Oral Daily  . arformoterol  15 mcg Nebulization BID  . aspirin EC  81 mg Oral Daily  . budesonide (PULMICORT) nebulizer solution  0.5 mg Nebulization BID  . heparin  5,000 Units Subcutaneous Q8H  . insulin aspart  0-20 Units Subcutaneous TID WC  . insulin aspart  0-5 Units Subcutaneous QHS  . insulin aspart  8 Units Subcutaneous TID WC  . insulin glargine  20 Units Subcutaneous Daily  . ipratropium-albuterol  3 mL Nebulization TID  . levothyroxine  125 mcg Oral Daily  . mouth rinse  15 mL Mouth Rinse BID  . multivitamin-lutein  1 capsule Oral Daily  . simvastatin  20 mg Oral Daily  . sodium chloride flush  3 mL Intravenous Q12H  . timolol  1 drop Both Eyes BID  . vitamin B-12  1,000 mcg Oral Daily   Continuous Infusions: . sodium chloride      Procedures/Studies: CT CHEST WO CONTRAST  Result Date: 02/10/2020 CLINICAL DATA:  Cough, persistent COPD exacerbation. Chest x-ray with questionable abnormalities. EXAM: CT CHEST WITHOUT CONTRAST TECHNIQUE: Multidetector CT imaging of the chest was performed following the standard protocol without IV contrast. COMPARISON:  Chest x-ray of 01/30/2020 and CT a chest abdomen and pelvis from 08/18/2018 FINDINGS:  Cardiovascular: Calcified atherosclerotic changes and three-vessel coronary artery disease with evidence of prior trans arterial aortic valve replacement. Heart size mildly enlarged without pericardial effusion. Limited assessment of vascular structures due to lack of intravenous contrast. Central pulmonary vasculature  mildly dilated at 2.7 cm. Mediastinum/Nodes: Mildly enlarged AP window lymph node 11 mm not changed since 2019. Scattered small lymph nodes throughout the chest none with pathologic enlargement. Hilar structures not well evaluated. No thoracic inlet adenopathy. No thoracic inlet adenopathy. Esophagus with mild circumferential thickening distally. Lungs/Pleura: Patchy opacities at the right lung apex with surrounding ground-glass and peripheral predominance. Some of these areas show small central solid-appearing nodules, largest measuring 4 mm (image 55, series 5) process appears confined to the right upper lobe. Airways are patent. Some distortion of the right hilum and mild narrowing of the bronchus intermedius is unchanged and may reflect mild bronchomalacia. Scattered areas of scarring in the lingula are stable. Upper Abdomen: Incidental imaging of upper abdominal contents showing no signs of acute abnormality. Musculoskeletal: No acute bone finding or destructive bone process. IMPRESSION: 1. Patchy opacities at the right lung apex with surrounding ground-glass and peripheral predominance. Process appears confined to mainly to the right upper lobe. This would be atypical for metastases. Infection with viral or atypical process is considered. Correlation with blood cultures may be helpful to exclude hematogenous process in this patient with aortic valve replacement. 2. Stable mildly enlarged AP window lymph node. Attention on follow-up 3. Mild circumferential thickening of the distal esophagus may reflect esophagitis. 4. Calcified atherosclerotic changes and three-vessel coronary artery disease with  evidence of prior trans arterial aortic valve replacement. Aortic Atherosclerosis (ICD10-I70.0). Electronically Signed   By: Zetta Bills M.D.   On: 02/10/2020 17:58   US RENAL  Result Date: 02/12/2020 CLINICAL DATA:  Acute renal failure superimposed upon chronic kidney disease, diabetes mellitus, hypertension, hyperlipidemia, horseshoe kidney EXAM: RENAL / URINARY TRACT ULTRASOUND COMPLETE COMPARISON:  CT abdomen 08/18/2018 FINDINGS: Right Kidney: Renal measurements: 14.4 x 4.9 x 6.7 cm = volume: 250 mL. Extremely poorly visualized due to cortical thinning, increased cortical echogenicity, and body habitus. No gross evidence of mass or hydronephrosis are identified on limited assessment. Left Kidney: Renal measurements: 10.0 x 4.1 x 4.9 cm = volume: 106 mL. Extremely poorly visualized due to cortical thinning, increased cortical echogenicity, and body habitus. No gross evidence of mass or hydronephrosis on limited assessment. Bladder: Decompressed by catheter, unable to evaluate Other: N/A IMPRESSION: Severe limitations of exam secondary to body habitus, bowel gas, and horseshoe anatomy. There appears to be cortical thinning and increased cortical echogenicity of the renal moieties bilaterally without gross evidence of mass or hydronephrosis on limited assessment. Electronically Signed   By: Lavonia Dana M.D.   On: 02/12/2020 14:59   DG Chest Portable 1 View  Result Date: 02/03/2020 CLINICAL DATA:  Wheezing EXAM: PORTABLE CHEST 1 VIEW COMPARISON:  08/29/2019 FINDINGS: Stable mild cardiomegaly status post TAVR. Calcific aortic knob. Bilateral interstitial opacities, right worse than left with areas of subtle nodularity most prominent in the right upper lobe. No pleural effusion or pneumothorax. IMPRESSION: Bilateral interstitial opacities, right worse than left with areas of subtle nodularity most prominent in the right upper lobe. Findings could reflect atypical or viral pneumonia. Radiographic follow-up to  resolution is recommended. Electronically Signed   By: Davina Poke D.O.   On: 01/16/2020 14:46   ECHOCARDIOGRAM COMPLETE  Result Date: 02/10/2020    ECHOCARDIOGRAM REPORT   Patient Name:   ARIELE VIDRIO Date of Exam: 02/10/2020 Medical Rec #:  [id number]         Height:       57.0 in Accession #:    [id number]        Weight:  150.4 lb Date of Birth:  1946-07-19         BSA:          1.593 m Patient Age:    67 years          BP:           128/61 mmHg Patient Gender: F                 HR:           75 bpm. Exam Location:  Inpatient Procedure: 2D Echo and Strain Analysis Indications:    Aortic Stenosis 424.1 / 135.0  History:        Patient has prior history of Echocardiogram examinations, most                 recent 01/11/2019. Stroke, Signs/Symptoms:Shortness of Breath;                 Risk Factors:Hypertension, Diabetes and Dyslipidemia. Pneumonia.                 Chronic kidney disease. Bilateral carotid stenosis.                 Aortic Valve: 23 mm Edwards Sapien prosthetic, stented (TAVR)                 valve is present in the aortic position. Procedure Date:                 01/10/2019.  Sonographer:    Darlina Sicilian RDCS Referring Phys: [id number] [place]  1. Left ventricular ejection fraction, by estimation, is 55 to 60%. The left ventricle has normal function. The left ventricle has no regional wall motion abnormalities. There is moderate concentric left ventricular hypertrophy. Left ventricular diastolic parameters are consistent with Grade I diastolic dysfunction (impaired relaxation). Elevated left ventricular end-diastolic pressure.  2. Right ventricular systolic function is normal. The right ventricular size is normal.  3. The mitral valve is normal in structure and function. Mild mitral annular calcification. Mild mitral valve regurgitation. No evidence of mitral stenosis.  4. The aortic valve has been repaired/replaced. There is trivial perivalvular AI ipresent. No  aortic stenosis is present. There is a 23 mm Edwards Sapien prosthetic (TAVR) valve present in the aortic position. that is funcitoning normaly. Aortic valve mean gradient measures 12.0 mmHg. Aortic valve peak gradient measures 21.8 mmHg. Aortic valve area, by VTI measures 1.66 cm. Dimensionless index is 0.44 and the AVA is calculated to be 1.51cm2.  5. The inferior vena cava is normal in size with greater than 50% respiratory variability, suggesting right atrial pressure of 3 mmHg.  6. Compared to prior echo, the mean TAVR gradient has increased from 11 to 66mmhg and AVG is 1.51cm2 (was 1.88cm2 on last echo). FINDINGS  Left Ventricle: Left ventricular ejection fraction, by estimation, is 55 to 60%. The left ventricle has normal function. The left ventricle has no regional wall motion abnormalities. The left ventricular internal cavity size was normal in size. There is  moderate concentric left ventricular hypertrophy. Left ventricular diastolic parameters are consistent with Grade I diastolic dysfunction (impaired relaxation). Elevated left ventricular end-diastolic pressure. Right Ventricle: The right ventricular size is normal. No increase in right ventricular wall thickness. Right ventricular systolic function is normal. Left Atrium: Left atrial size was normal in size. Right Atrium: Right atrial size was normal in size. Pericardium: Trivial pericardial effusion is present. The pericardial effusion is posterior to the  left ventricle. Mitral Valve: The mitral valve is normal in structure and function. Normal mobility of the mitral valve leaflets. Mild mitral annular calcification. Mild mitral valve regurgitation. No evidence of mitral valve stenosis. Tricuspid Valve: The tricuspid valve is normal in structure. Tricuspid valve regurgitation is not demonstrated. No evidence of tricuspid stenosis. Aortic Valve: The aortic valve has been repaired/replaced. Aortic valve regurgitation trivial perivalvular AI is  present. No aortic stenosis is present. Aortic valve mean gradient measures 12.0 mmHg. Aortic valve peak gradient measures 21.8 mmHg. Aortic valve area, by VTI measures 1.66 cm. There is a 23 mm Edwards Sapien prosthetic, stented (TAVR) valve present in the aortic position. Procedure Date: 01/10/2019. Pulmonic Valve: The pulmonic valve was normal in structure. Pulmonic valve regurgitation is not visualized. No evidence of pulmonic stenosis. Aorta: The aortic root is normal in size and structure. Venous: The inferior vena cava is normal in size with greater than 50% respiratory variability, suggesting right atrial pressure of 3 mmHg. IAS/Shunts: No atrial level shunt detected by color flow Doppler.  LEFT VENTRICLE PLAX 2D LVIDd:         4.79 cm      Diastology LVIDs:         3.43 cm      LV e' lateral:   4.35 cm/s LV PW:         1.22 cm      LV E/e' lateral: 29.7 LV IVS:        1.34 cm      LV e' medial:    3.00 cm/s LVOT diam:     2.20 cm      LV E/e' medial:  43.0 LV SV:         82 LV SV Index:   52 LVOT Area:     3.80 cm  LV Volumes (MOD) LV vol d, MOD A2C: 99.5 ml LV vol d, MOD A4C: 125.0 ml LV vol s, MOD A2C: 50.2 ml LV vol s, MOD A4C: 56.4 ml LV SV MOD A2C:     49.3 ml LV SV MOD A4C:     125.0 ml LV SV MOD BP:      58.3 ml RIGHT VENTRICLE RV S prime:     11.20 cm/s TAPSE (M-mode): 1.6 cm LEFT ATRIUM             Index       RIGHT ATRIUM           Index LA diam:        3.60 cm 2.26 cm/m  RA Area:     15.20 cm LA Vol (A2C):   55.7 ml 34.96 ml/m RA Volume:   34.70 ml  21.78 ml/m LA Vol (A4C):   49.6 ml 31.13 ml/m LA Biplane Vol: 53.2 ml 33.39 ml/m  AORTIC VALVE AV Area (Vmax):    1.51 cm AV Area (Vmean):   1.61 cm AV Area (VTI):     1.66 cm AV Vmax:           233.50 cm/s AV Vmean:          162.000 cm/s AV VTI:            0.496 m AV Peak Grad:      21.8 mmHg AV Mean Grad:      12.0 mmHg LVOT Vmax:         92.50 cm/s LVOT Vmean:        68.400 cm/s LVOT VTI:  0.217 m LVOT/AV VTI ratio: 0.44  AORTA  Ao Root diam: 3.10 cm Ao Asc diam:  3.00 cm MITRAL VALVE MV Area (PHT): 3.21 cm     SHUNTS MV Decel Time: 236 msec     Systemic VTI:  0.22 m MV E velocity: 129.00 cm/s  Systemic Diam: 2.20 cm MV A velocity: 144.00 cm/s MV E/A ratio:  0.90 Fransico Him MD Electronically signed by Fransico Him MD Signature Date/Time: 02/10/2020/5:45:00 PM    Final     Irwin Brakeman, MD  How to contact the [facility] Attending or Consulting provider [place] or covering provider during after hours [place], for this patient?  1. Check the care team in [facility] and look for a) attending/consulting [facility] provider listed and b) the [facility] team listed 2. Log into [email or web address] and use [place]'s universal password to access. If you do not have the password, please contact the hospital operator. 3. Locate the [facility] provider you are looking for under [facility] and page to a number that you can be directly reached. 4. If you still have difficulty reaching the provider, please page the [facility] [place] (Director on Call) for the Hospitalists listed on amion for assistance.   If 7PM-7AM, please contact night-coverage [email or web address] Password [email or web address] 02/16/2020, 5:54 PM   LOS: 7 days

## 2020-02-16 NOTE — Progress Notes (Addendum)
Patient was very lethargic at 1200. See flowsheet for vitals.  Hard to arouse.  Blood sugar was 117. Dr. Wynetta Emery was notified.

## 2020-02-17 LAB — RENAL FUNCTION PANEL
Albumin: 3.1 g/dL — ABNORMAL LOW (ref 3.5–5.0)
Anion gap: 9 (ref 5–15)
BUN: 94 mg/dL — ABNORMAL HIGH (ref 8–23)
CO2: 24 mmol/L (ref 22–32)
Calcium: 7.8 mg/dL — ABNORMAL LOW (ref 8.9–10.3)
Chloride: 107 mmol/L (ref 98–111)
Creatinine, Ser: 2.79 mg/dL — ABNORMAL HIGH (ref 0.44–1.00)
GFR calc Af Amer: 19 mL/min — ABNORMAL LOW (ref >60)
GFR calc non Af Amer: 16 mL/min — ABNORMAL LOW (ref >60)
Glucose, Bld: 79 mg/dL (ref 70–99)
Phosphorus: 6 mg/dL — ABNORMAL HIGH (ref 2.5–4.6)
Potassium: 4.2 mmol/L (ref 3.5–5.1)
Sodium: 140 mmol/L (ref 135–145)

## 2020-02-17 LAB — GLUCOSE, CAPILLARY
Glucose-Capillary: 22 mg/dL — CL (ref 70–99)
Glucose-Capillary: 33 mg/dL — CL (ref 70–99)
Glucose-Capillary: 134 mg/dL — ABNORMAL HIGH (ref 70–99)
Glucose-Capillary: 55 mg/dL — ABNORMAL LOW (ref 70–99)
Glucose-Capillary: 132 mg/dL — ABNORMAL HIGH (ref 70–99)
Glucose-Capillary: 176 mg/dL — ABNORMAL HIGH (ref 70–99)
Glucose-Capillary: 178 mg/dL — ABNORMAL HIGH (ref 70–99)
Glucose-Capillary: 290 mg/dL — ABNORMAL HIGH (ref 70–99)

## 2020-02-17 MED ORDER — DEXTROSE 50 % IV SOLN
INTRAVENOUS | Status: AC
  Filled 2020-02-17: qty 50

## 2020-02-17 MED ORDER — FUROSEMIDE 10 MG/ML IJ SOLN
30.0000 mg | Freq: Once | INTRAMUSCULAR | Status: AC
  Administered 2020-02-17: 30 mg via INTRAVENOUS
  Filled 2020-02-17: qty 4

## 2020-02-17 MED ORDER — DEXTROSE 50 % IV SOLN
1.0000 | Freq: Once | INTRAVENOUS | Status: AC
  Administered 2020-02-17: 50 mL via INTRAVENOUS
  Filled 2020-02-17: qty 50

## 2020-02-17 NOTE — Plan of Care (Signed)

## 2020-02-17 NOTE — Progress Notes (Signed)
Hypoglycemic Event  CBG: 33   Treatment: 1 amp D-50  Symptoms: lethargy  Follow-up CBG: Time: 0335 CBG Result:134  Possible Reasons for Event: diabetic patient not eating  Comments/MD notified: Dr. Darrick Meigs notified after second amp of D-50 given.    Lucrezia Starch

## 2020-02-17 NOTE — Progress Notes (Signed)
Hypoglycemic Event  CBG: 55 at 0817  Treatment: 8 oz juice/soda  Symptoms: None  Follow-up CBG: [id number] CBG Result:132  Possible Reasons for Event: Unknown  Comments/MD notified:awaiting instructions    [place]

## 2020-02-17 NOTE — Progress Notes (Signed)
[name] NEPHROLOGY PROGRESS NOTE  Assessment/ Plan: Pt is a 74 y.o. yo female with COPD, diastolic CHF, DM, HTN, CKD 3, severe AS status post TAVR, embolic stroke admitted with COPD exacerbation, hypoxia, consulted for AKI on CKD.  #Acute kidney injury on CKD stage IIIb, nonoliguric: Likely hemodynamically mediated.  UA with chronic proteinuria, no RBC or WBC.  Kidney ultrasound with no obstruction however has cortical thinning.  Patient is nonoliguric and BUN, creatinine level trending down.  She has no tremor or asterixis today.  Receiving a dose of Lasix for fluid overload ordered by primary team. No need for dialysis.  Continue to monitor lab, strict ins and outs.  #Subnephrotic range proteinuria likely due to diabetic nephropathy: UA has no RBC or WBC.  Recommend outpatient follow-up.  No ACE inhibitor or ARB because of low GFR.  #Tremor/myoclonic jerk: Presumed due to gabapentin toxicity.  Clinically improved.  Currently Neurontin on hold.  The maximum dose of Neurontin should not be more than 300 mg/day.  #Chronic diastolic CHF: She has some trace lower extreme edema.  Agree with low-dose Lasix today.  #CAP, acute hypoxic respiratory failure/COPD exacerbation: Continue antibiotics and steroid.  Per primary team.  #Hypertension: On Norvasc.  Lasix as needed as above.  Monitor blood pressure.  We will see her back on 3/8, please call back with question.  I will review lab tomorrow.  Subjective: Patient was seen and examined at bedside.  She denied nausea vomiting chest pain shortness of breath.  No tremor.  Urine output around 700 cc recorded. Objective Vital signs in last 24 hours: Vitals:   02/17/20 0544 02/17/20 0722 02/17/20 0727 02/17/20 0745  BP: (!) 157/67     Pulse: 71     Resp: 18     Temp: 98.3 F (36.8 C)     TempSrc: Oral     SpO2:  98% 98% 98%  Weight:      Height:       Weight change: -2.7 kg  Intake/Output Summary (Last 24 hours) at  02/17/2020 1310 Last data filed at 02/17/2020 0217 Gross per 24 hour  Intake 120 ml  Output 1100 ml  Net -980 ml       Labs: Basic Metabolic Panel: Recent Labs  Lab 02/15/20 0412 02/16/20 0408 02/17/20 0615  NA 134* 138 140  K 4.8 4.7 4.2  CL 101 106 107  CO2 20* 22 24  GLUCOSE 203* 82 79  BUN 101* 103* 94*  CREATININE 3.52* 3.23* 2.79*  CALCIUM 7.7* 7.6* 7.8*  PHOS 6.4* 6.4* 6.0*   Liver Function Tests: Recent Labs  Lab 02/15/20 0412 02/16/20 0408 02/17/20 0615  ALBUMIN 3.4* 3.0* 3.1*   No results for input(s): LIPASE, AMYLASE in the last 168 hours. No results for input(s): AMMONIA in the last 168 hours. CBC: Recent Labs  Lab 02/11/20 0540 02/15/20 0412  WBC 16.6* 12.6*  HGB 10.8* 10.8*  HCT 33.9* 33.8*  MCV 89.2 88.0  PLT 378 389   Cardiac Enzymes: Recent Labs  Lab 02/12/20 1354  CKTOTAL 31*   CBG: Recent Labs  Lab 02/17/20 0253 02/17/20 0337 02/17/20 0817 02/17/20 0917 02/17/20 1113  GLUCAP 33* 134* 55* 132* 176*    Iron Studies: No results for input(s): IRON, TIBC, TRANSFERRIN, FERRITIN in the last 72 hours. Studies/Results: No results found.  Medications: Infusions: . sodium chloride      Scheduled Medications: . amLODipine  10 mg Oral Daily  . arformoterol  15 mcg Nebulization BID  .  aspirin EC  81 mg Oral Daily  . budesonide (PULMICORT) nebulizer solution  0.5 mg Nebulization BID  . furosemide  30 mg Intravenous Once  . heparin  5,000 Units Subcutaneous Q8H  . ipratropium-albuterol  3 mL Nebulization TID  . levothyroxine  125 mcg Oral Daily  . mouth rinse  15 mL Mouth Rinse BID  . multivitamin-lutein  1 capsule Oral Daily  . simvastatin  20 mg Oral Daily  . sodium chloride flush  3 mL Intravenous Q12H  . timolol  1 drop Both Eyes BID  . vitamin B-12  1,000 mcg Oral Daily    have reviewed scheduled and prn medications.  Physical Exam: General:NAD, comfortable Heart:RRR, s1s2 nl Lungs:clear b/l, no crackle Abdomen:soft,  Non-tender, non-distended Extremities: Trace lower extremity edema present Neurology: Alert, awake and oriented to hospital, 2021.  Not oriented to month.  No asterixis or tremor noted today  [name] 02/17/2020,1:10 PM  LOS: 8 days  Pager: [phone]

## 2020-02-17 NOTE — Progress Notes (Signed)
PROGRESS NOTE  [name] [name] ZOX:[id number] DOB: Jun 13, 1946 DOA: 02/03/2020 PCP: [employer] [employer], MD   Brief History: 73 year old female with a history of diastolic CHF, COPD, CKD stage III, diabetes mellitus type 2, hypertension, severe aortic stenosis status post TAVR 07/24/9146, embolic stroke presenting with 3-day history of generalized weakness, coughing, shortness of breath, and wheezing. The patient also had some unsteady gait and fell. She did not lose consciousness. The patient has not been able to use her nebulizer machine at home secondary to malfunctioning. She denies any fevers, chills, chest pain, vomiting, diarrhea, abdominal pain. Notably, the patient had a hospitalization from 01/10/2019 to2/02/2019 where she underwent TAVR. She had postoperative acute on chronic renal failure. Her postoperative course was also complicated by bilateral lower extremity weakness and myoclonus.She was seen by neurology at that time, and it was felt to be secondary to gabapentin toxicity in the setting of her acute on chronic renal failure. MRI of the brain did show cardioembolic stroke. CTA of the head and neck was negative for LVO. But it did show advanced intracranial atherosclerosis with moderate to severe right and moderate left intracranial ICA stenosis. The patient was subsequently discharged to [facility].  In the emergency department, the patient was afebrile hemodynamically stable with oxygen saturation 95% room air. WBC 11.7. Serum creatinine was 2.01. Chest x-ray showed bilateral interstitial opacities. The patient was started on Solu-Medrol for COPD exacerbation.  Assessment/Plan: Acute Respiratory Failure with hypoxia -due to PNA and COPD exacerbation -stable on 2L -wean oxygen as able  COPD exacerbation -ContinuePulmicort -Completed IV Solu-Medrol -Continue duonebs -continue brovana  R-lungopacity/CAP -Completed course of empiric ceftriaxone and  azithromycin -Procalcitonin<0.10 -CT chest--Patchy opacities at the right lung apex with surrounding ground-glass and peripheral predominance; bronchomalacia; circumferential esophageal thickening  Acute on chronic renal failure CKD stage IIIb -Baseline creatinine 1.5-1.6, has been >3 for last several days, nephrology following, may need temp HD. -Suspect the patient may have a degree of progression of her underlying renal disease -serum creatinine continues to rise -nephrology consulted, may require HD if no improvement -temporarily holding diuretics  -renal US--neg hydronephrosis  Chronic diastolic CHF -Daily weights -appears clinically euvolemic -2/27 echo--EF 55-60%, no WMA, G1DD, mild MR, s/pTAVR -Pt volume overloaded today, I have ordered lasix 30 mg IV x 1 dose  Uncontrolled diabetes mellitus type 2  Hypoglycemia from insulin  - Pt has not been eating well last 24 hours. We have discontinued all insulin, continue CBG testing  -Pt is eating and drinking much better today.  Will follow closely.     CBG (last 3)  Recent Labs    02/17/20 0817 02/17/20 0917 02/17/20 1113  GLUCAP 55* 132* 176*    Hypertension -Continue amlodipine -controlled  Hypothyroidism -TSH 32.53 -pt reports not missing doses of levothyroxine, dose increased in hospital  Hyperlipidemia -Continue statin  Frequent falls -PT evaluation-->SNF -orthostatic vital signs neg  History of stroke -Continue aspirin  History of TAVR -Underwent TAVR 01/10/2019 -finished 6 months DAPT  Disposition Plan: Pt from home but has become too weak and deconditioned to return home, agreed to SNF rehab, continues to require inpatient care for IV steroids and continues to have acute on chronic respiratory failure requiring acute treatments, also renal function continues to decline and may require hemodialysis if no improvement, not ready to discharge today due primary to renal issue, otherwise ready for  SNF   Family Communication:Daughter updated on 3/2, updated again 3/4, attempted 3/6  Consultants:none  Code Status: FULL   DVT Prophylaxis: [place] Heparin   Procedures: As Listed in Progress Note Above  Antibiotics: Ceftriaxone 2/26>>> azithro 2/26>>>  Subjective: Pt more alert and eating better today      Objective: Vitals:   02/17/20 0544 02/17/20 0722 02/17/20 0727 02/17/20 0745  BP: (!) 157/67     Pulse: 71     Resp: 18     Temp: 98.3 F (36.8 C)     TempSrc: Oral     SpO2:  98% 98% 98%  Weight:      Height:        Intake/Output Summary (Last 24 hours) at 02/17/2020 1258 Last data filed at 02/17/2020 0217 Gross per 24 hour  Intake 480 ml  Output 1700 ml  Net -1220 ml   Weight change: -2.7 kg Exam:   General:  Awake, alert, cooperative, pleasant.   HEENT: NCAT. Neck supple. No JVD.   Cardiovascular: normal s1,s2 sounds.   Respiratory: rare expiratory wheezes heard.   Abdomen: Soft/+BS, non tender, non distended, no guarding  Extremities: subcut edema BUEs L>R  Data Reviewed: I have personally reviewed following labs and imaging studies Basic Metabolic Panel: Recent Labs  Lab 02/11/20 0540 02/12/20 0416 02/13/20 0503 02/14/20 0515 02/15/20 0412 02/16/20 0408 02/17/20 0615  NA 132*   < > 128* 131* 134* 138 140  K 4.8   < > 5.0 5.0 4.8 4.7 4.2  CL 96*   < > 95* 98 101 106 107  CO2 26   < > 20* 21* 20* 22 24  GLUCOSE 292*   < > 208* 217* 203* 82 79  BUN 43*   < > 79* 93* 101* 103* 94*  CREATININE 2.34*   < > 3.05* 3.56* 3.52* 3.23* 2.79*  CALCIUM 8.5*   < > 7.7* 7.5* 7.7* 7.6* 7.8*  MG 2.6*  --   --   --   --   --   --   PHOS  --   --  5.8* 6.3* 6.4* 6.4* 6.0*   < > = values in this interval not displayed.   Liver Function Tests: Recent Labs  Lab 02/13/20 0503 02/14/20 0515 02/15/20 0412 02/16/20 0408 02/17/20 0615  ALBUMIN 3.5 3.4* 3.4* 3.0* 3.1*   No results for input(s): LIPASE, AMYLASE in the last 168 hours. No  results for input(s): AMMONIA in the last 168 hours. Coagulation Profile: No results for input(s): INR, PROTIME in the last 168 hours. CBC: Recent Labs  Lab 02/11/20 0540 02/15/20 0412  WBC 16.6* 12.6*  HGB 10.8* 10.8*  HCT 33.9* 33.8*  MCV 89.2 88.0  PLT 378 389   Cardiac Enzymes: Recent Labs  Lab 02/12/20 1354  CKTOTAL 31*   BNP: Invalid input(s): POCBNP CBG: Recent Labs  Lab 02/17/20 0253 02/17/20 0337 02/17/20 0817 02/17/20 0917 02/17/20 1113  GLUCAP 33* 134* 55* 132* 176*   HbA1C: No results for input(s): HGBA1C in the last 72 hours. Urine analysis:    Component Value Date/Time   COLORURINE STRAW (A) 02/14/2020 1159   APPEARANCEUR CLEAR 02/14/2020 1159   LABSPEC 1.011 02/14/2020 1159   PHURINE 6.0 02/14/2020 1159   GLUCOSEU 150 (A) 02/14/2020 1159   HGBUR NEGATIVE 02/14/2020 1159   BILIRUBINUR NEGATIVE 02/14/2020 1159   KETONESUR NEGATIVE 02/14/2020 1159   PROTEINUR 100 (A) 02/14/2020 1159   UROBILINOGEN 0.2 09/23/2013 1926   NITRITE NEGATIVE 02/14/2020 1159   LEUKOCYTESUR NEGATIVE 02/14/2020 1159   Recent Results (from the past 240  hour(s))  SARS CORONAVIRUS 2 (TAT 6-24 HRS) Nasopharyngeal Nasopharyngeal Swab     Status: None   Collection Time: 02/03/2020  7:58 PM   Specimen: Nasopharyngeal Swab  Result Value Ref Range Status   SARS Coronavirus 2 NEGATIVE NEGATIVE Final    Comment: (NOTE) SARS-CoV-2 target nucleic acids are NOT DETECTED. The SARS-CoV-2 RNA is generally detectable in upper and lower respiratory specimens during the acute phase of infection. Negative results do not preclude SARS-CoV-2 infection, do not rule out co-infections with other pathogens, and should not be used as the sole basis for treatment or other patient management decisions. Negative results must be combined with clinical observations, patient history, and epidemiological information. The expected result is Negative. Fact Sheet for  Patients: SugarRoll.be Fact Sheet for Healthcare Providers: https://www.woods-mathews.com/ This test is not yet approved or cleared by the [place] FDA and  has been authorized for detection and/or diagnosis of SARS-CoV-2 by FDA under an Emergency Use Authorization (EUA). This EUA will remain  in effect (meaning this test can be used) for the duration of the COVID-19 declaration under Section 56 4(b)(1) of the Act, 21 U.S.C. section 360bbb-3(b)(1), unless the authorization is terminated or revoked sooner. Performed at [facility], [place] [address] [place], [place] [address]   Respiratory Panel by PCR     Status: Abnormal   Collection Time: 02/11/20  6:10 PM   Specimen: Nasopharyngeal Swab; Respiratory  Result Value Ref Range Status   Adenovirus NOT DETECTED NOT DETECTED Final   Coronavirus 229E NOT DETECTED NOT DETECTED Final    Comment: (NOTE) The Coronavirus on the Respiratory Panel, DOES NOT test for the novel  Coronavirus (2019 nCoV)    Coronavirus HKU1 NOT DETECTED NOT DETECTED Final   Coronavirus NL63 NOT DETECTED NOT DETECTED Final   Coronavirus OC43 NOT DETECTED NOT DETECTED Final   Metapneumovirus NOT DETECTED NOT DETECTED Final   Rhinovirus / Enterovirus DETECTED (A) NOT DETECTED Final   Influenza A NOT DETECTED NOT DETECTED Final   Influenza B NOT DETECTED NOT DETECTED Final   Parainfluenza Virus 1 NOT DETECTED NOT DETECTED Final   Parainfluenza Virus 2 NOT DETECTED NOT DETECTED Final   Parainfluenza Virus 3 NOT DETECTED NOT DETECTED Final   Parainfluenza Virus 4 NOT DETECTED NOT DETECTED Final   Respiratory Syncytial Virus NOT DETECTED NOT DETECTED Final   Bordetella pertussis NOT DETECTED NOT DETECTED Final   Chlamydophila pneumoniae NOT DETECTED NOT DETECTED Final   Mycoplasma pneumoniae NOT DETECTED NOT DETECTED Final    Comment: Performed at [facility], [place]. [address], [place], [place] [address]   Expectorated sputum assessment w rflx to resp cult     Status: None (Preliminary result)   Collection Time: 02/12/20  2:27 PM   Specimen: Sputum  Result Value Ref Range Status   Specimen Description SPUTUM  Final   Special Requests NONE  Final   Sputum evaluation   Final    THIS SPECIMEN IS ACCEPTABLE FOR SPUTUM CULTURE Performed at [facility] Performed at [facility], [address], [place], [place] [address]    Report Status PENDING  Incomplete  Culture, respiratory     Status: None   Collection Time: 02/12/20  2:27 PM   Specimen: SPU  Result Value Ref Range Status   Specimen Description   Final    SPUTUM Performed at [facility], [address], [place], [place] [address]    Special Requests   Final    NONE Reflexed  from [id number] Performed at [facility], [address] [place], [place] [address]    Gram Stain NO WBC SEEN RARE GRAM POSITIVE COCCI   Final   Culture   Final    Consistent with normal respiratory flora. Performed at [facility], [place] [address], [place], [place] [address]    Report Status 02/14/2020 FINAL  Final  SARS CORONAVIRUS 2 (TAT 6-24 HRS) Nasopharyngeal Nasopharyngeal Swab     Status: None   Collection Time: 02/14/20 11:57 AM   Specimen: Nasopharyngeal Swab  Result Value Ref Range Status   SARS Coronavirus 2 NEGATIVE NEGATIVE Final    Comment: (NOTE) SARS-CoV-2 target nucleic acids are NOT DETECTED. The SARS-CoV-2 RNA is generally detectable in upper and lower respiratory specimens during the acute phase of infection. Negative results do not preclude SARS-CoV-2 infection, do not rule out co-infections with other pathogens, and should not be used as the sole basis for treatment or other patient management decisions. Negative results must be combined with clinical observations, patient history, and epidemiological information. The expected result is Negative. Fact Sheet for Patients: SugarRoll.be Fact  Sheet for Healthcare Providers: https://www.woods-mathews.com/ This test is not yet approved or cleared by the [place] FDA and  has been authorized for detection and/or diagnosis of SARS-CoV-2 by FDA under an Emergency Use Authorization (EUA). This EUA will remain  in effect (meaning this test can be used) for the duration of the COVID-19 declaration under Section 56 4(b)(1) of the Act, 21 U.S.C. section 360bbb-3(b)(1), unless the authorization is terminated or revoked sooner. Performed at [facility], [place] [address] [place], [place] [address]      Scheduled Meds: . amLODipine  10 mg Oral Daily  . arformoterol  15 mcg Nebulization BID  . aspirin EC  81 mg Oral Daily  . budesonide (PULMICORT) nebulizer solution  0.5 mg Nebulization BID  . furosemide  30 mg Intravenous Once  . heparin  5,000 Units Subcutaneous Q8H  . ipratropium-albuterol  3 mL Nebulization TID  . levothyroxine  125 mcg Oral Daily  . mouth rinse  15 mL Mouth Rinse BID  . multivitamin-lutein  1 capsule Oral Daily  . simvastatin  20 mg Oral Daily  . sodium chloride flush  3 mL Intravenous Q12H  . timolol  1 drop Both Eyes BID  . vitamin B-12  1,000 mcg Oral Daily   Continuous Infusions: . sodium chloride      Procedures/Studies: CT CHEST WO CONTRAST  Result Date: 02/10/2020 CLINICAL DATA:  Cough, persistent COPD exacerbation. Chest x-ray with questionable abnormalities. EXAM: CT CHEST WITHOUT CONTRAST TECHNIQUE: Multidetector CT imaging of the chest was performed following the standard protocol without IV contrast. COMPARISON:  Chest x-ray of 02/01/2020 and CT a chest abdomen and pelvis from 08/18/2018 FINDINGS: Cardiovascular: Calcified atherosclerotic changes and three-vessel coronary artery disease with evidence of prior trans arterial aortic valve replacement. Heart size mildly enlarged without pericardial effusion. Limited assessment of vascular structures due to lack of intravenous  contrast. Central pulmonary vasculature mildly dilated at 2.7 cm. Mediastinum/Nodes: Mildly enlarged AP window lymph node 11 mm not changed since 2019. Scattered small lymph nodes throughout the chest none with pathologic enlargement. Hilar structures not well evaluated. No thoracic inlet adenopathy. No thoracic inlet adenopathy. Esophagus with mild circumferential thickening distally. Lungs/Pleura: Patchy opacities at the right lung apex with surrounding ground-glass and peripheral predominance. Some of these areas show small central solid-appearing nodules, largest measuring 4 mm (image 55, series 5) process appears  confined to the right upper lobe. Airways are patent. Some distortion of the right hilum and mild narrowing of the bronchus intermedius is unchanged and may reflect mild bronchomalacia. Scattered areas of scarring in the lingula are stable. Upper Abdomen: Incidental imaging of upper abdominal contents showing no signs of acute abnormality. Musculoskeletal: No acute bone finding or destructive bone process. IMPRESSION: 1. Patchy opacities at the right lung apex with surrounding ground-glass and peripheral predominance. Process appears confined to mainly to the right upper lobe. This would be atypical for metastases. Infection with viral or atypical process is considered. Correlation with blood cultures may be helpful to exclude hematogenous process in this patient with aortic valve replacement. 2. Stable mildly enlarged AP window lymph node. Attention on follow-up 3. Mild circumferential thickening of the distal esophagus may reflect esophagitis. 4. Calcified atherosclerotic changes and three-vessel coronary artery disease with evidence of prior trans arterial aortic valve replacement. Aortic Atherosclerosis (ICD10-I70.0). Electronically Signed   By: Zetta Bills M.D.   On: 02/10/2020 17:58   US RENAL  Result Date: 02/12/2020 CLINICAL DATA:  Acute renal failure superimposed upon chronic kidney  disease, diabetes mellitus, hypertension, hyperlipidemia, horseshoe kidney EXAM: RENAL / URINARY TRACT ULTRASOUND COMPLETE COMPARISON:  CT abdomen 08/18/2018 FINDINGS: Right Kidney: Renal measurements: 14.4 x 4.9 x 6.7 cm = volume: 250 mL. Extremely poorly visualized due to cortical thinning, increased cortical echogenicity, and body habitus. No gross evidence of mass or hydronephrosis are identified on limited assessment. Left Kidney: Renal measurements: 10.0 x 4.1 x 4.9 cm = volume: 106 mL. Extremely poorly visualized due to cortical thinning, increased cortical echogenicity, and body habitus. No gross evidence of mass or hydronephrosis on limited assessment. Bladder: Decompressed by catheter, unable to evaluate Other: N/A IMPRESSION: Severe limitations of exam secondary to body habitus, bowel gas, and horseshoe anatomy. There appears to be cortical thinning and increased cortical echogenicity of the renal moieties bilaterally without gross evidence of mass or hydronephrosis on limited assessment. Electronically Signed   By: Lavonia Dana M.D.   On: 02/12/2020 14:59   DG Chest Portable 1 View  Result Date: 02/03/2020 CLINICAL DATA:  Wheezing EXAM: PORTABLE CHEST 1 VIEW COMPARISON:  08/29/2019 FINDINGS: Stable mild cardiomegaly status post TAVR. Calcific aortic knob. Bilateral interstitial opacities, right worse than left with areas of subtle nodularity most prominent in the right upper lobe. No pleural effusion or pneumothorax. IMPRESSION: Bilateral interstitial opacities, right worse than left with areas of subtle nodularity most prominent in the right upper lobe. Findings could reflect atypical or viral pneumonia. Radiographic follow-up to resolution is recommended. Electronically Signed   By: Davina Poke D.O.   On: 02/08/2020 14:46   ECHOCARDIOGRAM COMPLETE  Result Date: 02/10/2020    ECHOCARDIOGRAM REPORT   Patient Name:   CARLOTA PHILLEY Date of Exam: 02/10/2020 Medical Rec #:  [id number]          Height:       57.0 in Accession #:    [id number]        Weight:       150.4 lb Date of Birth:  01-23-1946         BSA:          1.593 m Patient Age:    30 years          BP:           128/61 mmHg Patient Gender: F  HR:           75 bpm. Exam Location:  Inpatient Procedure: 2D Echo and Strain Analysis Indications:    Aortic Stenosis 424.1 / 135.0  History:        Patient has prior history of Echocardiogram examinations, most                 recent 01/11/2019. Stroke, Signs/Symptoms:Shortness of Breath;                 Risk Factors:Hypertension, Diabetes and Dyslipidemia. Pneumonia.                 Chronic kidney disease. Bilateral carotid stenosis.                 Aortic Valve: 23 mm Edwards Sapien prosthetic, stented (TAVR)                 valve is present in the aortic position. Procedure Date:                 01/10/2019.  Sonographer:    Darlina Sicilian RDCS Referring Phys: [id number] [place]  1. Left ventricular ejection fraction, by estimation, is 55 to 60%. The left ventricle has normal function. The left ventricle has no regional wall motion abnormalities. There is moderate concentric left ventricular hypertrophy. Left ventricular diastolic parameters are consistent with Grade I diastolic dysfunction (impaired relaxation). Elevated left ventricular end-diastolic pressure.  2. Right ventricular systolic function is normal. The right ventricular size is normal.  3. The mitral valve is normal in structure and function. Mild mitral annular calcification. Mild mitral valve regurgitation. No evidence of mitral stenosis.  4. The aortic valve has been repaired/replaced. There is trivial perivalvular AI ipresent. No aortic stenosis is present. There is a 23 mm Edwards Sapien prosthetic (TAVR) valve present in the aortic position. that is funcitoning normaly. Aortic valve mean gradient measures 12.0 mmHg. Aortic valve peak gradient measures 21.8 mmHg. Aortic valve area, by VTI measures  1.66 cm. Dimensionless index is 0.44 and the AVA is calculated to be 1.51cm2.  5. The inferior vena cava is normal in size with greater than 50% respiratory variability, suggesting right atrial pressure of 3 mmHg.  6. Compared to prior echo, the mean TAVR gradient has increased from 11 to 2mmhg and AVG is 1.51cm2 (was 1.88cm2 on last echo). FINDINGS  Left Ventricle: Left ventricular ejection fraction, by estimation, is 55 to 60%. The left ventricle has normal function. The left ventricle has no regional wall motion abnormalities. The left ventricular internal cavity size was normal in size. There is  moderate concentric left ventricular hypertrophy. Left ventricular diastolic parameters are consistent with Grade I diastolic dysfunction (impaired relaxation). Elevated left ventricular end-diastolic pressure. Right Ventricle: The right ventricular size is normal. No increase in right ventricular wall thickness. Right ventricular systolic function is normal. Left Atrium: Left atrial size was normal in size. Right Atrium: Right atrial size was normal in size. Pericardium: Trivial pericardial effusion is present. The pericardial effusion is posterior to the left ventricle. Mitral Valve: The mitral valve is normal in structure and function. Normal mobility of the mitral valve leaflets. Mild mitral annular calcification. Mild mitral valve regurgitation. No evidence of mitral valve stenosis. Tricuspid Valve: The tricuspid valve is normal in structure. Tricuspid valve regurgitation is not demonstrated. No evidence of tricuspid stenosis. Aortic Valve: The aortic valve has been repaired/replaced. Aortic valve regurgitation trivial perivalvular AI is present. No aortic stenosis is present.  Aortic valve mean gradient measures 12.0 mmHg. Aortic valve peak gradient measures 21.8 mmHg. Aortic valve area, by VTI measures 1.66 cm. There is a 23 mm Edwards Sapien prosthetic, stented (TAVR) valve present in the aortic position.  Procedure Date: 01/10/2019. Pulmonic Valve: The pulmonic valve was normal in structure. Pulmonic valve regurgitation is not visualized. No evidence of pulmonic stenosis. Aorta: The aortic root is normal in size and structure. Venous: The inferior vena cava is normal in size with greater than 50% respiratory variability, suggesting right atrial pressure of 3 mmHg. IAS/Shunts: No atrial level shunt detected by color flow Doppler.  LEFT VENTRICLE PLAX 2D LVIDd:         4.79 cm      Diastology LVIDs:         3.43 cm      LV e' lateral:   4.35 cm/s LV PW:         1.22 cm      LV E/e' lateral: 29.7 LV IVS:        1.34 cm      LV e' medial:    3.00 cm/s LVOT diam:     2.20 cm      LV E/e' medial:  43.0 LV SV:         82 LV SV Index:   52 LVOT Area:     3.80 cm  LV Volumes (MOD) LV vol d, MOD A2C: 99.5 ml LV vol d, MOD A4C: 125.0 ml LV vol s, MOD A2C: 50.2 ml LV vol s, MOD A4C: 56.4 ml LV SV MOD A2C:     49.3 ml LV SV MOD A4C:     125.0 ml LV SV MOD BP:      58.3 ml RIGHT VENTRICLE RV S prime:     11.20 cm/s TAPSE (M-mode): 1.6 cm LEFT ATRIUM             Index       RIGHT ATRIUM           Index LA diam:        3.60 cm 2.26 cm/m  RA Area:     15.20 cm LA Vol (A2C):   55.7 ml 34.96 ml/m RA Volume:   34.70 ml  21.78 ml/m LA Vol (A4C):   49.6 ml 31.13 ml/m LA Biplane Vol: 53.2 ml 33.39 ml/m  AORTIC VALVE AV Area (Vmax):    1.51 cm AV Area (Vmean):   1.61 cm AV Area (VTI):     1.66 cm AV Vmax:           233.50 cm/s AV Vmean:          162.000 cm/s AV VTI:            0.496 m AV Peak Grad:      21.8 mmHg AV Mean Grad:      12.0 mmHg LVOT Vmax:         92.50 cm/s LVOT Vmean:        68.400 cm/s LVOT VTI:          0.217 m LVOT/AV VTI ratio: 0.44  AORTA Ao Root diam: 3.10 cm Ao Asc diam:  3.00 cm MITRAL VALVE MV Area (PHT): 3.21 cm     SHUNTS MV Decel Time: 236 msec     Systemic VTI:  0.22 m MV E velocity: 129.00 cm/s  Systemic Diam: 2.20 cm MV A velocity: 144.00 cm/s MV E/A ratio:  0.90 Fransico Him MD Electronically signed  by  Fransico Him MD Signature Date/Time: 02/10/2020/5:45:00 PM    Final     Irwin Brakeman, MD  How to contact the [facility] Attending or Consulting provider [place] or covering provider during after hours [place], for this patient?  1. Check the care team in [facility] and look for a) attending/consulting [facility] provider listed and b) the [facility] team listed 2. Log into [email or web address] and use [place]'s universal password to access. If you do not have the password, please contact the hospital operator. 3. Locate the [facility] provider you are looking for under [facility] and page to a number that you can be directly reached. 4. If you still have difficulty reaching the provider, please page the [facility] (Director on Call) for the Hospitalists listed on amion for assistance.   If 7PM-7AM, please contact night-coverage [email or web address] Password [email or web address] 02/17/2020, 12:58 PM   LOS: 8 days

## 2020-02-17 NOTE — Progress Notes (Signed)
Hypoglycemic Event  CBG: 22  Treatment: AMP D-50 Symptoms: lethargy Follow-up CBG: Time:0253 CBG Result:33  Possible Reasons for Event: patient not eating well and Diabetic patient  Comments/MD notified:Dr. Lama notified at 0244 after D-50 given awating further orders.       Lucrezia Starch

## 2020-02-18 LAB — RENAL FUNCTION PANEL
Albumin: 3 g/dL — ABNORMAL LOW (ref 3.5–5.0)
Anion gap: 9 (ref 5–15)
BUN: 78 mg/dL — ABNORMAL HIGH (ref 8–23)
CO2: 27 mmol/L (ref 22–32)
Calcium: 8.1 mg/dL — ABNORMAL LOW (ref 8.9–10.3)
Chloride: 106 mmol/L (ref 98–111)
Creatinine, Ser: 2.26 mg/dL — ABNORMAL HIGH (ref 0.44–1.00)
GFR calc Af Amer: 24 mL/min — ABNORMAL LOW (ref >60)
GFR calc non Af Amer: 21 mL/min — ABNORMAL LOW (ref >60)
Glucose, Bld: 214 mg/dL — ABNORMAL HIGH (ref 70–99)
Phosphorus: 4.5 mg/dL (ref 2.5–4.6)
Potassium: 3.9 mmol/L (ref 3.5–5.1)
Sodium: 142 mmol/L (ref 135–145)

## 2020-02-18 LAB — GLUCOSE, CAPILLARY
Glucose-Capillary: 234 mg/dL — ABNORMAL HIGH (ref 70–99)
Glucose-Capillary: 178 mg/dL — ABNORMAL HIGH (ref 70–99)
Glucose-Capillary: 324 mg/dL — ABNORMAL HIGH (ref 70–99)
Glucose-Capillary: 224 mg/dL — ABNORMAL HIGH (ref 70–99)
Glucose-Capillary: 381 mg/dL — ABNORMAL HIGH (ref 70–99)

## 2020-02-18 LAB — CBC
HCT: 29.9 % — ABNORMAL LOW (ref 36.0–46.0)
Hemoglobin: 9.6 g/dL — ABNORMAL LOW (ref 12.0–15.0)
MCH: 28.1 pg (ref 26.0–34.0)
MCHC: 32.1 g/dL (ref 30.0–36.0)
MCV: 87.4 fL (ref 80.0–100.0)
Platelets: 285 10*3/uL (ref 150–400)
RBC: 3.42 MIL/uL — ABNORMAL LOW (ref 3.87–5.11)
RDW: 15.3 % (ref 11.5–15.5)
WBC: 18.3 10*3/uL — ABNORMAL HIGH (ref 4.0–10.5)
nRBC: 0.3 % — ABNORMAL HIGH (ref 0.0–0.2)

## 2020-02-18 MED ORDER — INSULIN ASPART 100 UNIT/ML ~~LOC~~ SOLN
0.0000 [IU] | Freq: Three times a day (TID) | SUBCUTANEOUS | Status: DC
  Administered 2020-02-18: 18:00:00 3 [IU] via SUBCUTANEOUS
  Administered 2020-02-18: 7 [IU] via SUBCUTANEOUS
  Administered 2020-02-19: 5 [IU] via SUBCUTANEOUS

## 2020-02-18 MED ORDER — INSULIN ASPART 100 UNIT/ML ~~LOC~~ SOLN
2.0000 [IU] | Freq: Three times a day (TID) | SUBCUTANEOUS | Status: DC
  Administered 2020-02-18: 2 [IU] via SUBCUTANEOUS

## 2020-02-18 MED ORDER — FUROSEMIDE 10 MG/ML IJ SOLN
30.0000 mg | Freq: Once | INTRAMUSCULAR | Status: AC
  Administered 2020-02-18: 13:00:00 30 mg via INTRAVENOUS
  Filled 2020-02-18: qty 4

## 2020-02-18 MED ORDER — INSULIN ASPART 100 UNIT/ML ~~LOC~~ SOLN
0.0000 [IU] | Freq: Every day | SUBCUTANEOUS | Status: DC
  Administered 2020-02-18: 5 [IU] via SUBCUTANEOUS

## 2020-02-18 NOTE — Plan of Care (Signed)

## 2020-02-18 NOTE — Progress Notes (Signed)
PROGRESS NOTE  [name] [name] CNO:[id number] DOB: [birth year] DOA: 01/17/2020 PCP: [employer], MD   Brief History: 74 year old female with a history of diastolic CHF, COPD, CKD stage III, diabetes mellitus type 2, hypertension, severe aortic stenosis status post TAVR 2/94/7654, embolic stroke presenting with 3-day history of generalized weakness, coughing, shortness of breath, and wheezing. The patient also had some unsteady gait and fell. She did not lose consciousness. The patient has not been able to use her nebulizer machine at home secondary to malfunctioning. She denies any fevers, chills, chest pain, vomiting, diarrhea, abdominal pain. Notably, the patient had a hospitalization from 01/10/2019 to2/02/2019 where she underwent TAVR. She had postoperative acute on chronic renal failure. Her postoperative course was also complicated by bilateral lower extremity weakness and myoclonus.She was seen by neurology at that time, and it was felt to be secondary to gabapentin toxicity in the setting of her acute on chronic renal failure. MRI of the brain did show cardioembolic stroke. CTA of the head and neck was negative for LVO. But it did show advanced intracranial atherosclerosis with moderate to severe right and moderate left intracranial ICA stenosis. The patient was subsequently discharged to [facility].  In the emergency department, the patient was afebrile hemodynamically stable with oxygen saturation 95% room air. WBC 11.7. Serum creatinine was 2.01. Chest x-ray showed bilateral interstitial opacities. The patient was started on Solu-Medrol for COPD exacerbation.  Assessment/Plan: Acute Respiratory Failure with hypoxia -due to PNA and COPD exacerbation -stable on 2L -wean oxygen as able  COPD exacerbation - resolved now -ContinuePulmicort -Completed IV Solu-Medrol -Continue duonebs -continue brovana  R-lungopacity/CAP -Completed course of empiric  ceftriaxone and azithromycin -Procalcitonin<0.10 -CT chest--Patchy opacities at the right lung apex with surrounding ground-glass and peripheral predominance; bronchomalacia; circumferential esophageal thickening  Acute on chronic renal failure CKD stage IIIb -Baseline creatinine 1.5-1.6, creatinine trending down for last 2 days -Suspect the patient may have a degree of progression of her underlying renal disease -serum creatinine trending down -nephrology consulted and following -temporarily held diuretic -renal US--neg hydronephrosis  Chronic diastolic CHF -Daily weights -appears clinically euvolemic -2/27 echo--EF 55-60%, no WMA, G1DD, mild MR, s/pTAVR -Pt remains volume overloaded today, I have ordered additional dose of lasix 30 mg IV x 1 dose  Uncontrolled diabetes mellitus type 2  Hypoglycemia from insulin  -Pt is eating and drinking much better today.  Will follow closely.     CBG (last 3)  Recent Labs    02/17/20 2113 02/18/20 0419 02/18/20 0742  GLUCAP 290* 234* 178*    Hypertension -Continue amlodipine -controlled  Hypothyroidism -TSH 32.53 -pt reports not missing doses of levothyroxine, dose increased in hospital  Hyperlipidemia -Continue statin  Frequent falls -PT evaluation-->SNF -orthostatic vital signs neg  History of stroke -Continue aspirin  History of TAVR -Underwent TAVR 01/10/2019 -finished 6 months DAPT  Disposition Plan: Pt from home but has become too weak and deconditioned to return home, agreed to SNF rehab, continues to require inpatient care for IV steroids and continues to have acute on chronic respiratory failure requiring acute treatments, also renal function continues to decline and may require hemodialysis if no improvement, not ready to discharge today due primary to renal issue, and volume overload requiring IV lasix, otherwise ready for SNF   Family Communication:Daughter updated on 3/2, updated again 3/4,  attempted 3/6  Consultants:none  Code Status: FULL   DVT Prophylaxis: [place] Heparin   Procedures: As Listed in Progress  Note Above  Antibiotics: Ceftriaxone 2/26>>>3/2 azithro 2/26>>>3/2  Subjective: Pt says the swelling in her hands is a little better, less painful and tight today.  No chest pain.  No SOB.      Objective: Vitals:   02/18/20 0522 02/18/20 0709 02/18/20 0714 02/18/20 0727  BP: (!) 161/69     Pulse: 83     Resp:      Temp: 98 F (36.7 C)     TempSrc: Oral     SpO2: 98% 97% 97% 97%  Weight:      Height:        Intake/Output Summary (Last 24 hours) at 02/18/2020 1115 Last data filed at 02/18/2020 7322 Gross per 24 hour  Intake 720 ml  Output 3801 ml  Net -3081 ml   Weight change: 0.1 kg Exam:   General:  Awake, alert, cooperative, pleasant.   HEENT: NCAT. Neck supple. No JVD.   Cardiovascular: normal s1,s2 sounds.   Respiratory: rare expiratory wheezes heard. Rare bibasilar crackles.   Abdomen: Soft/+BS, non tender, non distended, no guarding  Extremities: improved subcut edema BUEs L>R  Data Reviewed: I have personally reviewed following labs and imaging studies Basic Metabolic Panel: Recent Labs  Lab 02/14/20 0515 02/15/20 0412 02/16/20 0408 02/17/20 0615 02/18/20 0602  NA 131* 134* 138 140 142  K 5.0 4.8 4.7 4.2 3.9  CL 98 101 106 107 106  CO2 21* 20* 22 24 27   GLUCOSE 217* 203* 82 79 214*  BUN 93* 101* 103* 94* 78*  CREATININE 3.56* 3.52* 3.23* 2.79* 2.26*  CALCIUM 7.5* 7.7* 7.6* 7.8* 8.1*  PHOS 6.3* 6.4* 6.4* 6.0* 4.5   Liver Function Tests: Recent Labs  Lab 02/14/20 0515 02/15/20 0412 02/16/20 0408 02/17/20 0615 02/18/20 0602  ALBUMIN 3.4* 3.4* 3.0* 3.1* 3.0*   No results for input(s): LIPASE, AMYLASE in the last 168 hours. No results for input(s): AMMONIA in the last 168 hours. Coagulation Profile: No results for input(s): INR, PROTIME in the last 168 hours. CBC: Recent Labs  Lab 02/15/20 0412  02/18/20 0602  WBC 12.6* 18.3*  HGB 10.8* 9.6*  HCT 33.8* 29.9*  MCV 88.0 87.4  PLT 389 285   Cardiac Enzymes: Recent Labs  Lab 02/12/20 1354  CKTOTAL 31*   BNP: Invalid input(s): POCBNP CBG: Recent Labs  Lab 02/17/20 1113 02/17/20 1656 02/17/20 2113 02/18/20 0419 02/18/20 0742  GLUCAP 176* 178* 290* 234* 178*   HbA1C: No results for input(s): HGBA1C in the last 72 hours. Urine analysis:    Component Value Date/Time   COLORURINE STRAW (A) 02/14/2020 1159   APPEARANCEUR CLEAR 02/14/2020 1159   LABSPEC 1.011 02/14/2020 1159   PHURINE 6.0 02/14/2020 1159   GLUCOSEU 150 (A) 02/14/2020 1159   HGBUR NEGATIVE 02/14/2020 1159   BILIRUBINUR NEGATIVE 02/14/2020 1159   KETONESUR NEGATIVE 02/14/2020 1159   PROTEINUR 100 (A) 02/14/2020 1159   UROBILINOGEN 0.2 09/23/2013 1926   NITRITE NEGATIVE 02/14/2020 1159   LEUKOCYTESUR NEGATIVE 02/14/2020 1159   Recent Results (from the past 240 hour(s))  SARS CORONAVIRUS 2 (TAT 6-24 HRS) Nasopharyngeal Nasopharyngeal Swab     Status: None   Collection Time: 01/21/2020  7:58 PM   Specimen: Nasopharyngeal Swab  Result Value Ref Range Status   SARS Coronavirus 2 NEGATIVE NEGATIVE Final    Comment: (NOTE) SARS-CoV-2 target nucleic acids are NOT DETECTED. The SARS-CoV-2 RNA is generally detectable in upper and lower respiratory specimens during the acute phase of infection. Negative results do not preclude SARS-CoV-2  infection, do not rule out co-infections with other pathogens, and should not be used as the sole basis for treatment or other patient management decisions. Negative results must be combined with clinical observations, patient history, and epidemiological information. The expected result is Negative. Fact Sheet for Patients: SugarRoll.be Fact Sheet for Healthcare Providers: https://www.woods-mathews.com/ This test is not yet approved or cleared by the [place] FDA and  has  been authorized for detection and/or diagnosis of SARS-CoV-2 by FDA under an Emergency Use Authorization (EUA). This EUA will remain  in effect (meaning this test can be used) for the duration of the COVID-19 declaration under Section 56 4(b)(1) of the Act, 21 U.S.C. section 360bbb-3(b)(1), unless the authorization is terminated or revoked sooner. Performed at [facility], [place] [address], [place], [place] [address]   Respiratory Panel by PCR     Status: Abnormal   Collection Time: 02/11/20  6:10 PM   Specimen: Nasopharyngeal Swab; Respiratory  Result Value Ref Range Status   Adenovirus NOT DETECTED NOT DETECTED Final   Coronavirus 229E NOT DETECTED NOT DETECTED Final    Comment: (NOTE) The Coronavirus on the Respiratory Panel, DOES NOT test for the novel  Coronavirus (2019 nCoV)    Coronavirus HKU1 NOT DETECTED NOT DETECTED Final   Coronavirus NL63 NOT DETECTED NOT DETECTED Final   Coronavirus OC43 NOT DETECTED NOT DETECTED Final   Metapneumovirus NOT DETECTED NOT DETECTED Final   Rhinovirus / Enterovirus DETECTED (A) NOT DETECTED Final   Influenza A NOT DETECTED NOT DETECTED Final   Influenza B NOT DETECTED NOT DETECTED Final   Parainfluenza Virus 1 NOT DETECTED NOT DETECTED Final   Parainfluenza Virus 2 NOT DETECTED NOT DETECTED Final   Parainfluenza Virus 3 NOT DETECTED NOT DETECTED Final   Parainfluenza Virus 4 NOT DETECTED NOT DETECTED Final   Respiratory Syncytial Virus NOT DETECTED NOT DETECTED Final   Bordetella pertussis NOT DETECTED NOT DETECTED Final   Chlamydophila pneumoniae NOT DETECTED NOT DETECTED Final   Mycoplasma pneumoniae NOT DETECTED NOT DETECTED Final    Comment: Performed at [facility], [place] [address] [place], [place] [address]  Expectorated sputum assessment w rflx to resp cult     Status: None (Preliminary result)   Collection Time: 02/12/20  2:27 PM   Specimen: Sputum  Result Value Ref Range Status   Specimen Description SPUTUM  Final     Special Requests NONE  Final   Sputum evaluation   Final    THIS SPECIMEN IS ACCEPTABLE FOR SPUTUM CULTURE Performed at [facility] Performed at [facility], [address], [place], [place] [address]    Report Status PENDING  Incomplete  Culture, respiratory     Status: None   Collection Time: 02/12/20  2:27 PM   Specimen: SPU  Result Value Ref Range Status   Specimen Description   Final    SPUTUM Performed at [facility], [address] [place], [place] [address]    Special Requests   Final    NONE Reflexed from [id number] Performed at [facility], [address] [place], [place] [address]    Gram Stain NO WBC SEEN RARE GRAM POSITIVE COCCI   Final   Culture   Final    Consistent with normal respiratory flora. Performed at [facility], [place] [address] [place], [place] [address]    Report Status 02/14/2020 FINAL  Final  SARS CORONAVIRUS 2 (TAT 6-24 HRS) Nasopharyngeal Nasopharyngeal Swab     Status:  None   Collection Time: 02/14/20 11:57 AM   Specimen: Nasopharyngeal Swab  Result Value Ref Range Status   SARS Coronavirus 2 NEGATIVE NEGATIVE Final    Comment: (NOTE) SARS-CoV-2 target nucleic acids are NOT DETECTED. The SARS-CoV-2 RNA is generally detectable in upper and lower respiratory specimens during the acute phase of infection. Negative results do not preclude SARS-CoV-2 infection, do not rule out co-infections with other pathogens, and should not be used as the sole basis for treatment or other patient management decisions. Negative results must be combined with clinical observations, patient history, and epidemiological information. The expected result is Negative. Fact Sheet for Patients: SugarRoll.be Fact Sheet for Healthcare Providers: https://www.woods-mathews.com/ This test is not yet approved or cleared by the [place] FDA and  has been authorized for detection and/or diagnosis of SARS-CoV-2  by FDA under an Emergency Use Authorization (EUA). This EUA will remain  in effect (meaning this test can be used) for the duration of the COVID-19 declaration under Section 56 4(b)(1) of the Act, 21 U.S.C. section 360bbb-3(b)(1), unless the authorization is terminated or revoked sooner. Performed at [facility], [place] [address], [place], [place] [address]      Scheduled Meds: . amLODipine  10 mg Oral Daily  . arformoterol  15 mcg Nebulization BID  . aspirin EC  81 mg Oral Daily  . budesonide (PULMICORT) nebulizer solution  0.5 mg Nebulization BID  . furosemide  30 mg Intravenous Once  . heparin  5,000 Units Subcutaneous Q8H  . ipratropium-albuterol  3 mL Nebulization TID  . levothyroxine  125 mcg Oral Daily  . mouth rinse  15 mL Mouth Rinse BID  . multivitamin-lutein  1 capsule Oral Daily  . simvastatin  20 mg Oral Daily  . sodium chloride flush  3 mL Intravenous Q12H  . timolol  1 drop Both Eyes BID  . vitamin B-12  1,000 mcg Oral Daily   Continuous Infusions: . sodium chloride      Procedures/Studies: CT CHEST WO CONTRAST  Result Date: 02/10/2020 CLINICAL DATA:  Cough, persistent COPD exacerbation. Chest x-ray with questionable abnormalities. EXAM: CT CHEST WITHOUT CONTRAST TECHNIQUE: Multidetector CT imaging of the chest was performed following the standard protocol without IV contrast. COMPARISON:  Chest x-ray of 01/19/2020 and CT a chest abdomen and pelvis from 08/18/2018 FINDINGS: Cardiovascular: Calcified atherosclerotic changes and three-vessel coronary artery disease with evidence of prior trans arterial aortic valve replacement. Heart size mildly enlarged without pericardial effusion. Limited assessment of vascular structures due to lack of intravenous contrast. Central pulmonary vasculature mildly dilated at 2.7 cm. Mediastinum/Nodes: Mildly enlarged AP window lymph node 11 mm not changed since 2019. Scattered small lymph nodes throughout the chest none with  pathologic enlargement. Hilar structures not well evaluated. No thoracic inlet adenopathy. No thoracic inlet adenopathy. Esophagus with mild circumferential thickening distally. Lungs/Pleura: Patchy opacities at the right lung apex with surrounding ground-glass and peripheral predominance. Some of these areas show small central solid-appearing nodules, largest measuring 4 mm (image 55, series 5) process appears confined to the right upper lobe. Airways are patent. Some distortion of the right hilum and mild narrowing of the bronchus intermedius is unchanged and may reflect mild bronchomalacia. Scattered areas of scarring in the lingula are stable. Upper Abdomen: Incidental imaging of upper abdominal contents showing no signs of acute abnormality. Musculoskeletal: No acute bone finding or destructive bone process. IMPRESSION: 1. Patchy opacities at the right lung apex with surrounding ground-glass and peripheral predominance. Process appears confined  to mainly to the right upper lobe. This would be atypical for metastases. Infection with viral or atypical process is considered. Correlation with blood cultures may be helpful to exclude hematogenous process in this patient with aortic valve replacement. 2. Stable mildly enlarged AP window lymph node. Attention on follow-up 3. Mild circumferential thickening of the distal esophagus may reflect esophagitis. 4. Calcified atherosclerotic changes and three-vessel coronary artery disease with evidence of prior trans arterial aortic valve replacement. Aortic Atherosclerosis (ICD10-I70.0). Electronically Signed   By: Zetta Bills M.D.   On: 02/10/2020 17:58   US RENAL  Result Date: 02/12/2020 CLINICAL DATA:  Acute renal failure superimposed upon chronic kidney disease, diabetes mellitus, hypertension, hyperlipidemia, horseshoe kidney EXAM: RENAL / URINARY TRACT ULTRASOUND COMPLETE COMPARISON:  CT abdomen 08/18/2018 FINDINGS: Right Kidney: Renal measurements: 14.4 x 4.9 x  6.7 cm = volume: 250 mL. Extremely poorly visualized due to cortical thinning, increased cortical echogenicity, and body habitus. No gross evidence of mass or hydronephrosis are identified on limited assessment. Left Kidney: Renal measurements: 10.0 x 4.1 x 4.9 cm = volume: 106 mL. Extremely poorly visualized due to cortical thinning, increased cortical echogenicity, and body habitus. No gross evidence of mass or hydronephrosis on limited assessment. Bladder: Decompressed by catheter, unable to evaluate Other: N/A IMPRESSION: Severe limitations of exam secondary to body habitus, bowel gas, and horseshoe anatomy. There appears to be cortical thinning and increased cortical echogenicity of the renal moieties bilaterally without gross evidence of mass or hydronephrosis on limited assessment. Electronically Signed   By: Lavonia Dana M.D.   On: 02/12/2020 14:59   DG Chest Portable 1 View  Result Date: 02/02/2020 CLINICAL DATA:  Wheezing EXAM: PORTABLE CHEST 1 VIEW COMPARISON:  08/29/2019 FINDINGS: Stable mild cardiomegaly status post TAVR. Calcific aortic knob. Bilateral interstitial opacities, right worse than left with areas of subtle nodularity most prominent in the right upper lobe. No pleural effusion or pneumothorax. IMPRESSION: Bilateral interstitial opacities, right worse than left with areas of subtle nodularity most prominent in the right upper lobe. Findings could reflect atypical or viral pneumonia. Radiographic follow-up to resolution is recommended. Electronically Signed   By: Davina Poke D.O.   On: 01/23/2020 14:46   ECHOCARDIOGRAM COMPLETE  Result Date: 02/10/2020    ECHOCARDIOGRAM REPORT   Patient Name:   ARNETA MAHMOOD Date of Exam: 02/10/2020 Medical Rec #:  [id number]         Height:       57.0 in Accession #:    [id number]        Weight:       150.4 lb Date of Birth:  [birth year]         BSA:          1.593 m Patient Age:    9 years          BP:           128/61 mmHg Patient Gender: F                  HR:           75 bpm. Exam Location:  Inpatient Procedure: 2D Echo and Strain Analysis Indications:    Aortic Stenosis 424.1 / 135.0  History:        Patient has prior history of Echocardiogram examinations, most                 recent 01/11/2019. Stroke, Signs/Symptoms:Shortness of Breath;  Risk Factors:Hypertension, Diabetes and Dyslipidemia. Pneumonia.                 Chronic kidney disease. Bilateral carotid stenosis.                 Aortic Valve: 23 mm Edwards Sapien prosthetic, stented (TAVR)                 valve is present in the aortic position. Procedure Date:                 01/10/2019.  Sonographer:    Darlina Sicilian RDCS Referring Phys: [id number] [place]  1. Left ventricular ejection fraction, by estimation, is 55 to 60%. The left ventricle has normal function. The left ventricle has no regional wall motion abnormalities. There is moderate concentric left ventricular hypertrophy. Left ventricular diastolic parameters are consistent with Grade I diastolic dysfunction (impaired relaxation). Elevated left ventricular end-diastolic pressure.  2. Right ventricular systolic function is normal. The right ventricular size is normal.  3. The mitral valve is normal in structure and function. Mild mitral annular calcification. Mild mitral valve regurgitation. No evidence of mitral stenosis.  4. The aortic valve has been repaired/replaced. There is trivial perivalvular AI ipresent. No aortic stenosis is present. There is a 23 mm Edwards Sapien prosthetic (TAVR) valve present in the aortic position. that is funcitoning normaly. Aortic valve mean gradient measures 12.0 mmHg. Aortic valve peak gradient measures 21.8 mmHg. Aortic valve area, by VTI measures 1.66 cm. Dimensionless index is 0.44 and the AVA is calculated to be 1.51cm2.  5. The inferior vena cava is normal in size with greater than 50% respiratory variability, suggesting right atrial pressure of 3 mmHg.  6.  Compared to prior echo, the mean TAVR gradient has increased from 11 to 43mmhg and AVG is 1.51cm2 (was 1.88cm2 on last echo). FINDINGS  Left Ventricle: Left ventricular ejection fraction, by estimation, is 55 to 60%. The left ventricle has normal function. The left ventricle has no regional wall motion abnormalities. The left ventricular internal cavity size was normal in size. There is  moderate concentric left ventricular hypertrophy. Left ventricular diastolic parameters are consistent with Grade I diastolic dysfunction (impaired relaxation). Elevated left ventricular end-diastolic pressure. Right Ventricle: The right ventricular size is normal. No increase in right ventricular wall thickness. Right ventricular systolic function is normal. Left Atrium: Left atrial size was normal in size. Right Atrium: Right atrial size was normal in size. Pericardium: Trivial pericardial effusion is present. The pericardial effusion is posterior to the left ventricle. Mitral Valve: The mitral valve is normal in structure and function. Normal mobility of the mitral valve leaflets. Mild mitral annular calcification. Mild mitral valve regurgitation. No evidence of mitral valve stenosis. Tricuspid Valve: The tricuspid valve is normal in structure. Tricuspid valve regurgitation is not demonstrated. No evidence of tricuspid stenosis. Aortic Valve: The aortic valve has been repaired/replaced. Aortic valve regurgitation trivial perivalvular AI is present. No aortic stenosis is present. Aortic valve mean gradient measures 12.0 mmHg. Aortic valve peak gradient measures 21.8 mmHg. Aortic valve area, by VTI measures 1.66 cm. There is a 23 mm Edwards Sapien prosthetic, stented (TAVR) valve present in the aortic position. Procedure Date: 01/10/2019. Pulmonic Valve: The pulmonic valve was normal in structure. Pulmonic valve regurgitation is not visualized. No evidence of pulmonic stenosis. Aorta: The aortic root is normal in size and  structure. Venous: The inferior vena cava is normal in size with greater than 50% respiratory variability,  suggesting right atrial pressure of 3 mmHg. IAS/Shunts: No atrial level shunt detected by color flow Doppler.  LEFT VENTRICLE PLAX 2D LVIDd:         4.79 cm      Diastology LVIDs:         3.43 cm      LV e' lateral:   4.35 cm/s LV PW:         1.22 cm      LV E/e' lateral: 29.7 LV IVS:        1.34 cm      LV e' medial:    3.00 cm/s LVOT diam:     2.20 cm      LV E/e' medial:  43.0 LV SV:         82 LV SV Index:   52 LVOT Area:     3.80 cm  LV Volumes (MOD) LV vol d, MOD A2C: 99.5 ml LV vol d, MOD A4C: 125.0 ml LV vol s, MOD A2C: 50.2 ml LV vol s, MOD A4C: 56.4 ml LV SV MOD A2C:     49.3 ml LV SV MOD A4C:     125.0 ml LV SV MOD BP:      58.3 ml RIGHT VENTRICLE RV S prime:     11.20 cm/s TAPSE (M-mode): 1.6 cm LEFT ATRIUM             Index       RIGHT ATRIUM           Index LA diam:        3.60 cm 2.26 cm/m  RA Area:     15.20 cm LA Vol (A2C):   55.7 ml 34.96 ml/m RA Volume:   34.70 ml  21.78 ml/m LA Vol (A4C):   49.6 ml 31.13 ml/m LA Biplane Vol: 53.2 ml 33.39 ml/m  AORTIC VALVE AV Area (Vmax):    1.51 cm AV Area (Vmean):   1.61 cm AV Area (VTI):     1.66 cm AV Vmax:           233.50 cm/s AV Vmean:          162.000 cm/s AV VTI:            0.496 m AV Peak Grad:      21.8 mmHg AV Mean Grad:      12.0 mmHg LVOT Vmax:         92.50 cm/s LVOT Vmean:        68.400 cm/s LVOT VTI:          0.217 m LVOT/AV VTI ratio: 0.44  AORTA Ao Root diam: 3.10 cm Ao Asc diam:  3.00 cm MITRAL VALVE MV Area (PHT): 3.21 cm     SHUNTS MV Decel Time: 236 msec     Systemic VTI:  0.22 m MV E velocity: 129.00 cm/s  Systemic Diam: 2.20 cm MV A velocity: 144.00 cm/s MV E/A ratio:  0.90 Fransico Him MD Electronically signed by Fransico Him MD Signature Date/Time: 02/10/2020/5:45:00 PM    Final     Irwin Brakeman, MD  How to contact the [facility] Attending or Consulting provider [place] or covering provider during after hours 7P -7A,  for this patient?  1. Check the care team in [facility] and look for a) attending/consulting [facility] provider listed and b) the [facility] team listed 2. Log into [email or web address] and use [place] universal password to access. If you do not have the password, please contact the hospital  operator. 3. Locate the [facility] provider you are looking for under [facility] and page to a number that you can be directly reached. 4. If you still have difficulty reaching the provider, please page the [facility] (Director on Call) for the Hospitalists listed on amion for assistance.   If 7PM-7AM, please contact night-coverage [email or web address] Password [email or web address] 02/18/2020, 11:15 AM   LOS: 9 days

## 2020-02-19 ENCOUNTER — Inpatient Hospital Stay (HOSPITAL_COMMUNITY): Payer: Medicare HMO

## 2020-02-19 DIAGNOSIS — J189 Pneumonia, unspecified organism: Secondary | ICD-10-CM

## 2020-02-19 DIAGNOSIS — R579 Shock, unspecified: Secondary | ICD-10-CM

## 2020-02-19 DIAGNOSIS — R58 Hemorrhage, not elsewhere classified: Secondary | ICD-10-CM

## 2020-02-19 DIAGNOSIS — R0989 Other specified symptoms and signs involving the circulatory and respiratory systems: Secondary | ICD-10-CM

## 2020-02-19 DIAGNOSIS — J069 Acute upper respiratory infection, unspecified: Secondary | ICD-10-CM

## 2020-02-19 DIAGNOSIS — R062 Wheezing: Secondary | ICD-10-CM

## 2020-02-19 DIAGNOSIS — N1832 Chronic kidney disease, stage 3b: Secondary | ICD-10-CM

## 2020-02-19 DIAGNOSIS — Z8719 Personal history of other diseases of the digestive system: Secondary | ICD-10-CM

## 2020-02-19 DIAGNOSIS — J9601 Acute respiratory failure with hypoxia: Secondary | ICD-10-CM

## 2020-02-19 DIAGNOSIS — N179 Acute kidney failure, unspecified: Secondary | ICD-10-CM

## 2020-02-19 LAB — COMPREHENSIVE METABOLIC PANEL
ALT: 780 U/L — ABNORMAL HIGH (ref 0–44)
AST: 740 U/L — ABNORMAL HIGH (ref 15–41)
Albumin: 1.6 g/dL — ABNORMAL LOW (ref 3.5–5.0)
Alkaline Phosphatase: 82 U/L (ref 38–126)
Anion gap: 14 (ref 5–15)
BUN: 56 mg/dL — ABNORMAL HIGH (ref 8–23)
CO2: 18 mmol/L — ABNORMAL LOW (ref 22–32)
Calcium: 6.6 mg/dL — ABNORMAL LOW (ref 8.9–10.3)
Chloride: 107 mmol/L (ref 98–111)
Creatinine, Ser: 2.12 mg/dL — ABNORMAL HIGH (ref 0.44–1.00)
GFR calc Af Amer: 26 mL/min — ABNORMAL LOW (ref >60)
GFR calc non Af Amer: 22 mL/min — ABNORMAL LOW (ref >60)
Glucose, Bld: 238 mg/dL — ABNORMAL HIGH (ref 70–99)
Potassium: 5.6 mmol/L — ABNORMAL HIGH (ref 3.5–5.1)
Sodium: 139 mmol/L (ref 135–145)
Total Bilirubin: 1 mg/dL (ref 0.3–1.2)
Total Protein: 3.5 g/dL — ABNORMAL LOW (ref 6.5–8.1)

## 2020-02-19 LAB — HEMOGLOBIN AND HEMATOCRIT, BLOOD
HCT: 21.2 % — ABNORMAL LOW (ref 36.0–46.0)
Hemoglobin: 6.8 g/dL — CL (ref 12.0–15.0)

## 2020-02-19 LAB — CBC WITH DIFFERENTIAL/PLATELET
Abs Immature Granulocytes: 0.85 10*3/uL — ABNORMAL HIGH (ref 0.00–0.07)
Basophils Absolute: 0 10*3/uL (ref 0.0–0.1)
Basophils Relative: 0 %
Eosinophils Absolute: 0 10*3/uL (ref 0.0–0.5)
Eosinophils Relative: 0 %
HCT: 14.7 % — ABNORMAL LOW (ref 36.0–46.0)
Hemoglobin: 4.5 g/dL — CL (ref 12.0–15.0)
Immature Granulocytes: 8 %
Lymphocytes Relative: 10 %
Lymphs Abs: 1.1 10*3/uL (ref 0.7–4.0)
MCH: 28.8 pg (ref 26.0–34.0)
MCHC: 30.6 g/dL (ref 30.0–36.0)
MCV: 94.2 fL (ref 80.0–100.0)
Monocytes Absolute: 0.7 10*3/uL (ref 0.1–1.0)
Monocytes Relative: 6 %
Neutro Abs: 8.3 10*3/uL — ABNORMAL HIGH (ref 1.7–7.7)
Neutrophils Relative %: 76 %
Platelets: 130 10*3/uL — ABNORMAL LOW (ref 150–400)
RBC: 1.56 MIL/uL — ABNORMAL LOW (ref 3.87–5.11)
RDW: 15.8 % — ABNORMAL HIGH (ref 11.5–15.5)
WBC: 11 10*3/uL — ABNORMAL HIGH (ref 4.0–10.5)
nRBC: 0.5 % — ABNORMAL HIGH (ref 0.0–0.2)

## 2020-02-19 LAB — URINALYSIS, ROUTINE W REFLEX MICROSCOPIC
Bacteria, UA: NONE SEEN
Bilirubin Urine: NEGATIVE
Glucose, UA: >=500 mg/dL — AB
Hgb urine dipstick: NEGATIVE
Ketones, ur: NEGATIVE mg/dL
Leukocytes,Ua: NEGATIVE
Nitrite: NEGATIVE
Protein, ur: 100 mg/dL — AB
Specific Gravity, Urine: 1.011 (ref 1.005–1.030)
pH: 7 (ref 5.0–8.0)

## 2020-02-19 LAB — GLUCOSE, CAPILLARY
Glucose-Capillary: 266 mg/dL — ABNORMAL HIGH (ref 70–99)
Glucose-Capillary: 263 mg/dL — ABNORMAL HIGH (ref 70–99)
Glucose-Capillary: 428 mg/dL — ABNORMAL HIGH (ref 70–99)
Glucose-Capillary: 405 mg/dL — ABNORMAL HIGH (ref 70–99)
Glucose-Capillary: 378 mg/dL — ABNORMAL HIGH (ref 70–99)
Glucose-Capillary: 210 mg/dL — ABNORMAL HIGH (ref 70–99)
Glucose-Capillary: 91 mg/dL (ref 70–99)

## 2020-02-19 LAB — PROTIME-INR
INR: 1.8 — ABNORMAL HIGH (ref 0.8–1.2)
INR: 2.4 — ABNORMAL HIGH (ref 0.8–1.2)
Prothrombin Time: 20.5 seconds — ABNORMAL HIGH (ref 11.4–15.2)
Prothrombin Time: 26.1 seconds — ABNORMAL HIGH (ref 11.4–15.2)

## 2020-02-19 LAB — ABO/RH: ABO/RH(D): O POS

## 2020-02-19 LAB — BLOOD GAS, ARTERIAL
Acid-base deficit: 7.1 mmol/L — ABNORMAL HIGH (ref 0.0–2.0)
Bicarbonate: 18.8 mmol/L — ABNORMAL LOW (ref 20.0–28.0)
FIO2: 100
O2 Saturation: 99.3 %
Patient temperature: 36.4
pCO2 arterial: 32.8 mmHg (ref 32.0–48.0)
pH, Arterial: 7.346 — ABNORMAL LOW (ref 7.350–7.450)
pO2, Arterial: 357 mmHg — ABNORMAL HIGH (ref 83.0–108.0)

## 2020-02-19 LAB — RENAL FUNCTION PANEL
Albumin: 2.7 g/dL — ABNORMAL LOW (ref 3.5–5.0)
Anion gap: 8 (ref 5–15)
BUN: 60 mg/dL — ABNORMAL HIGH (ref 8–23)
CO2: 31 mmol/L (ref 22–32)
Calcium: 8 mg/dL — ABNORMAL LOW (ref 8.9–10.3)
Chloride: 104 mmol/L (ref 98–111)
Creatinine, Ser: 1.94 mg/dL — ABNORMAL HIGH (ref 0.44–1.00)
GFR calc Af Amer: 29 mL/min — ABNORMAL LOW (ref >60)
GFR calc non Af Amer: 25 mL/min — ABNORMAL LOW (ref >60)
Glucose, Bld: 292 mg/dL — ABNORMAL HIGH (ref 70–99)
Phosphorus: 3.6 mg/dL (ref 2.5–4.6)
Potassium: 3.6 mmol/L (ref 3.5–5.1)
Sodium: 143 mmol/L (ref 135–145)

## 2020-02-19 LAB — BASIC METABOLIC PANEL WITH GFR
Anion gap: 15 (ref 5–15)
BUN: 54 mg/dL — ABNORMAL HIGH (ref 8–23)
CO2: 16 mmol/L — ABNORMAL LOW (ref 22–32)
Calcium: 7.4 mg/dL — ABNORMAL LOW (ref 8.9–10.3)
Chloride: 113 mmol/L — ABNORMAL HIGH (ref 98–111)
Creatinine, Ser: 2.4 mg/dL — ABNORMAL HIGH (ref 0.44–1.00)
GFR calc Af Amer: 22 mL/min — ABNORMAL LOW
GFR calc non Af Amer: 19 mL/min — ABNORMAL LOW
Glucose, Bld: 95 mg/dL (ref 70–99)
Potassium: 5.3 mmol/L — ABNORMAL HIGH (ref 3.5–5.1)
Sodium: 144 mmol/L (ref 135–145)

## 2020-02-19 LAB — PREPARE RBC (CROSSMATCH)

## 2020-02-19 LAB — PROCALCITONIN: Procalcitonin: 0.16 ng/mL

## 2020-02-19 LAB — MRSA PCR SCREENING: MRSA by PCR: NEGATIVE

## 2020-02-19 LAB — LACTIC ACID, PLASMA
Lactic Acid, Venous: 10.3 mmol/L (ref 0.5–1.9)
Lactic Acid, Venous: 7.1 mmol/L (ref 0.5–1.9)

## 2020-02-19 LAB — APTT: aPTT: 88 seconds — ABNORMAL HIGH (ref 24–36)

## 2020-02-19 LAB — CORTISOL: Cortisol, Plasma: 23.6 ug/dL

## 2020-02-19 LAB — OCCULT BLOOD X 1 CARD TO LAB, STOOL: Fecal Occult Bld: POSITIVE — AB

## 2020-02-19 LAB — SARS CORONAVIRUS 2 (TAT 6-24 HRS): SARS Coronavirus 2: NEGATIVE

## 2020-02-19 MED ORDER — SODIUM CHLORIDE 0.9 % IV BOLUS (SEPSIS)
1000.0000 mL | Freq: Once | INTRAVENOUS | Status: AC
  Administered 2020-02-19: 15:00:00 1000 mL via INTRAVENOUS

## 2020-02-19 MED ORDER — SODIUM CHLORIDE 0.9% IV SOLUTION: Freq: Once | INTRAVENOUS | Status: AC

## 2020-02-19 MED ORDER — POTASSIUM CHLORIDE CRYS ER 20 MEQ PO TBCR: 20.0000 meq | EXTENDED_RELEASE_TABLET | ORAL | 0 refills | Status: AC | PRN

## 2020-02-19 MED ORDER — DOCUSATE SODIUM 50 MG/5ML PO LIQD
100.0000 mg | Freq: Two times a day (BID) | ORAL | Status: DC | PRN
  Filled 2020-02-19: qty 10

## 2020-02-19 MED ORDER — CHLORHEXIDINE GLUCONATE 0.12% ORAL RINSE (MEDLINE KIT)
15.0000 mL | Freq: Two times a day (BID) | OROMUCOSAL | Status: DC
  Administered 2020-02-19 – 2020-02-23 (×8): 15 mL via OROMUCOSAL

## 2020-02-19 MED ORDER — ORAL CARE MOUTH RINSE
15.0000 mL | OROMUCOSAL | Status: DC
  Administered 2020-02-19 – 2020-02-23 (×40): 15 mL via OROMUCOSAL

## 2020-02-19 MED ORDER — LEVOTHYROXINE SODIUM 125 MCG PO TABS: 125.0000 ug | ORAL_TABLET | Freq: Every day | ORAL | Status: AC

## 2020-02-19 MED ORDER — METRONIDAZOLE IN NACL 5-0.79 MG/ML-% IV SOLN
500.0000 mg | Freq: Three times a day (TID) | INTRAVENOUS | Status: DC
  Administered 2020-02-19: 500 mg via INTRAVENOUS
  Filled 2020-02-19: qty 100

## 2020-02-19 MED ORDER — SODIUM CHLORIDE 0.9 % IV SOLN
1.0000 g | Freq: Once | INTRAVENOUS | Status: AC
  Administered 2020-02-19: 1 g via INTRAVENOUS
  Filled 2020-02-19: qty 10

## 2020-02-19 MED ORDER — VITAMIN K1 10 MG/ML IJ SOLN
10.0000 mg | Freq: Once | INTRAVENOUS | Status: AC
  Administered 2020-02-19: 10 mg via INTRAVENOUS
  Filled 2020-02-19: qty 1

## 2020-02-19 MED ORDER — INSULIN ASPART 100 UNIT/ML ~~LOC~~ SOLN
18.0000 [IU] | Freq: Once | SUBCUTANEOUS | Status: AC
  Administered 2020-02-19: 18 [IU] via SUBCUTANEOUS

## 2020-02-19 MED ORDER — MIDAZOLAM HCL 2 MG/2ML IJ SOLN
2.0000 mg | Freq: Once | INTRAMUSCULAR | Status: AC
  Administered 2020-02-19: 2 mg via INTRAVENOUS

## 2020-02-19 MED ORDER — FLUTICASONE-SALMETEROL 250-50 MCG/DOSE IN AEPB: 1.0000 | INHALATION_SPRAY | Freq: Two times a day (BID) | RESPIRATORY_TRACT | Status: AC

## 2020-02-19 MED ORDER — SODIUM CHLORIDE 0.9 % IV SOLN
2.0000 g | Freq: Once | INTRAVENOUS | Status: AC
  Administered 2020-02-19: 2 g via INTRAVENOUS
  Filled 2020-02-19: qty 2

## 2020-02-19 MED ORDER — SODIUM CHLORIDE 0.9 % IV BOLUS
1000.0000 mL | Freq: Once | INTRAVENOUS | Status: AC
  Administered 2020-02-19: 1000 mL via INTRAVENOUS

## 2020-02-19 MED ORDER — VANCOMYCIN HCL 1500 MG/300ML IV SOLN
1500.0000 mg | Freq: Once | INTRAVENOUS | Status: DC
  Administered 2020-02-19: 1500 mg via INTRAVENOUS
  Filled 2020-02-19: qty 300

## 2020-02-19 MED ORDER — IPRATROPIUM-ALBUTEROL 0.5-2.5 (3) MG/3ML IN SOLN: 3.0000 mL | RESPIRATORY_TRACT | Status: AC | PRN

## 2020-02-19 MED ORDER — ZOLPIDEM TARTRATE 5 MG PO TABS: 5.0000 mg | ORAL_TABLET | Freq: Every evening | ORAL | 0 refills | Status: AC | PRN

## 2020-02-19 MED ORDER — MIDAZOLAM HCL 2 MG/2ML IJ SOLN
INTRAMUSCULAR | Status: AC
  Filled 2020-02-19: qty 2

## 2020-02-19 MED ORDER — LEVOTHYROXINE SODIUM 25 MCG PO TABS
125.0000 ug | ORAL_TABLET | Freq: Every day | ORAL | Status: DC
  Administered 2020-02-20 – 2020-02-23 (×4): 125 ug
  Filled 2020-02-19 (×4): qty 1

## 2020-02-19 MED ORDER — FUROSEMIDE 20 MG PO TABS: 20.0000 mg | ORAL_TABLET | ORAL | Status: AC | PRN

## 2020-02-19 MED ORDER — GUAIFENESIN-DM 100-10 MG/5ML PO SYRP: 10.0000 mL | ORAL_SOLUTION | ORAL | 0 refills | Status: AC | PRN

## 2020-02-19 MED ORDER — FENTANYL CITRATE (PF) 100 MCG/2ML IJ SOLN: 50.0000 ug | Freq: Once | INTRAMUSCULAR | Status: AC

## 2020-02-19 MED ORDER — IPRATROPIUM-ALBUTEROL 0.5-2.5 (3) MG/3ML IN SOLN
3.0000 mL | Freq: Two times a day (BID) | RESPIRATORY_TRACT | Status: DC
  Administered 2020-02-19: 3 mL via RESPIRATORY_TRACT
  Filled 2020-02-19: qty 3

## 2020-02-19 MED ORDER — POLYETHYLENE GLYCOL 3350 17 G PO PACK: 17.0000 g | PACK | Freq: Every day | ORAL | 0 refills | Status: AC

## 2020-02-19 MED ORDER — OCUVITE-LUTEIN PO CAPS
1.0000 | ORAL_CAPSULE | Freq: Every day | ORAL | Status: DC
  Filled 2020-02-19: qty 1

## 2020-02-19 MED ORDER — SODIUM CHLORIDE 0.9% FLUSH
10.0000 mL | Freq: Two times a day (BID) | INTRAVENOUS | Status: DC
  Administered 2020-02-20 – 2020-02-21 (×4): 10 mL
  Administered 2020-02-22: 30 mL
  Administered 2020-02-22 – 2020-02-23 (×2): 10 mL

## 2020-02-19 MED ORDER — INSULIN ASPART 100 UNIT/ML ~~LOC~~ SOLN
4.0000 [IU] | Freq: Three times a day (TID) | SUBCUTANEOUS | Status: DC
  Administered 2020-02-19 (×2): 4 [IU] via SUBCUTANEOUS

## 2020-02-19 MED ORDER — SODIUM CHLORIDE 0.9% FLUSH: 10.0000 mL | INTRAVENOUS | Status: DC | PRN

## 2020-02-19 MED ORDER — SODIUM CHLORIDE 0.9 % IV BOLUS (SEPSIS)
250.0000 mL | Freq: Once | INTRAVENOUS | Status: AC
  Administered 2020-02-19: 250 mL via INTRAVENOUS

## 2020-02-19 MED ORDER — VITAMIN K1 10 MG/ML IJ SOLN
INTRAMUSCULAR | Status: AC
  Filled 2020-02-19: qty 1

## 2020-02-19 MED ORDER — NOREPINEPHRINE 4 MG/250ML-% IV SOLN
0.0000 ug/min | INTRAVENOUS | Status: DC
  Administered 2020-02-19: 19:00:00 31 ug/min via INTRAVENOUS
  Administered 2020-02-19: 15:00:00 2 ug/min via INTRAVENOUS
  Administered 2020-02-20: 13 ug/min via INTRAVENOUS
  Administered 2020-02-20: 25 ug/min via INTRAVENOUS
  Administered 2020-02-20: 10 ug/min via INTRAVENOUS
  Administered 2020-02-20: 17 ug/min via INTRAVENOUS
  Administered 2020-02-21 (×2): 16 ug/min via INTRAVENOUS
  Filled 2020-02-19 (×2): qty 250
  Filled 2020-02-19: qty 500
  Filled 2020-02-19 (×4): qty 250

## 2020-02-19 MED ORDER — IPRATROPIUM-ALBUTEROL 0.5-2.5 (3) MG/3ML IN SOLN: 3.0000 mL | Freq: Two times a day (BID) | RESPIRATORY_TRACT | Status: AC

## 2020-02-19 MED ORDER — SODIUM CHLORIDE 0.9 % IV SOLN: 2.0000 g | INTRAVENOUS | Status: DC

## 2020-02-19 MED ORDER — SODIUM CHLORIDE 0.9 % IV BOLUS (SEPSIS)
1000.0000 mL | Freq: Once | INTRAVENOUS | Status: AC
  Administered 2020-02-19: 1000 mL via INTRAVENOUS

## 2020-02-19 MED ORDER — FENTANYL CITRATE (PF) 100 MCG/2ML IJ SOLN
INTRAMUSCULAR | Status: AC
  Administered 2020-02-19: 50 ug via INTRAVENOUS
  Filled 2020-02-19: qty 2

## 2020-02-19 MED ORDER — INSULIN ASPART 100 UNIT/ML ~~LOC~~ SOLN: 0.0000 [IU] | SUBCUTANEOUS | Status: DC | PRN

## 2020-02-19 MED ORDER — CHLORHEXIDINE GLUCONATE CLOTH 2 % EX PADS
6.0000 | MEDICATED_PAD | Freq: Every day | CUTANEOUS | Status: DC
  Administered 2020-02-19 – 2020-02-23 (×5): 6 via TOPICAL

## 2020-02-19 MED ORDER — FENTANYL CITRATE (PF) 100 MCG/2ML IJ SOLN
25.0000 ug | INTRAMUSCULAR | Status: DC | PRN
  Administered 2020-02-20: 25 ug via INTRAVENOUS
  Filled 2020-02-19 (×3): qty 2

## 2020-02-19 MED ORDER — FUROSEMIDE 10 MG/ML IJ SOLN
20.0000 mg | Freq: Once | INTRAMUSCULAR | Status: DC
  Filled 2020-02-19: qty 2

## 2020-02-19 MED ORDER — PANTOPRAZOLE SODIUM 40 MG IV SOLR
40.0000 mg | INTRAVENOUS | Status: DC
  Administered 2020-02-20 – 2020-02-22 (×4): 40 mg via INTRAVENOUS
  Filled 2020-02-19 (×5): qty 40

## 2020-02-19 MED ORDER — VITAMIN B-12 1000 MCG PO TABS
1000.0000 ug | ORAL_TABLET | Freq: Every day | ORAL | Status: DC
  Administered 2020-02-20 – 2020-02-23 (×4): 1000 ug
  Filled 2020-02-19 (×4): qty 1

## 2020-02-19 MED ORDER — FENTANYL CITRATE (PF) 100 MCG/2ML IJ SOLN
25.0000 ug | INTRAMUSCULAR | Status: DC | PRN
  Administered 2020-02-19: 25 ug via INTRAVENOUS
  Administered 2020-02-20: 50 ug via INTRAVENOUS
  Administered 2020-02-20 (×2): 100 ug via INTRAVENOUS
  Filled 2020-02-19 (×2): qty 2

## 2020-02-19 MED ORDER — ALPRAZOLAM 0.25 MG PO TABS: 0.2500 mg | ORAL_TABLET | Freq: Every evening | ORAL | 0 refills | Status: AC | PRN

## 2020-02-19 MED ORDER — HYDROCODONE-ACETAMINOPHEN 10-325 MG PO TABS: 1.0000 | ORAL_TABLET | Freq: Four times a day (QID) | ORAL | 0 refills | Status: AC | PRN

## 2020-02-19 MED ORDER — METFORMIN HCL ER 500 MG PO TB24: 1000.0000 mg | ORAL_TABLET | Freq: Two times a day (BID) | ORAL | Status: AC

## 2020-02-19 MED ORDER — DEXMEDETOMIDINE HCL IN NACL 400 MCG/100ML IV SOLN
0.0000 ug/kg/h | INTRAVENOUS | Status: DC
  Administered 2020-02-19: 0.4 ug/kg/h via INTRAVENOUS
  Administered 2020-02-20: 1.2 ug/kg/h via INTRAVENOUS
  Administered 2020-02-20: 0.6 ug/kg/h via INTRAVENOUS
  Administered 2020-02-20: 1 ug/kg/h via INTRAVENOUS
  Administered 2020-02-21: 0.2 ug/kg/h via INTRAVENOUS
  Administered 2020-02-21 (×2): 1.2 ug/kg/h via INTRAVENOUS
  Administered 2020-02-22: 0.2 ug/kg/h via INTRAVENOUS
  Filled 2020-02-19 (×8): qty 100

## 2020-02-19 MED ORDER — POLYETHYLENE GLYCOL 3350 17 G PO PACK: 17.0000 g | PACK | Freq: Every day | ORAL | Status: DC | PRN

## 2020-02-19 MED ORDER — VANCOMYCIN HCL 750 MG/150ML IV SOLN: 750.0000 mg | INTRAVENOUS | Status: DC

## 2020-02-19 NOTE — TOC Progression Note (Signed)
Transition of Care [facility]) - Progression Note    Patient Details  Name: [name] [name] MRN: [id number] Date of Birth: Oct 24, 1946  Transition of Care [facility]) CM/SW Contact  Ihor Gully, LCSW Phone Number: 02/19/2020, 2:47 PM  Clinical Narrative:    Facility notified that patient will not discharge today.    Expected Discharge Plan: Skilled Nursing Facility Barriers to Discharge: No Barriers Identified  Expected Discharge Plan and Services Expected Discharge Plan: [place]         Expected Discharge Date: 02/19/20                                     Social Determinants of Health (SDOH) Interventions    Readmission Risk Interventions Readmission Risk Prevention Plan 02/12/2020  Transportation Screening Complete  PCP or Specialist Appt within 3-5 Days Not Complete  HRI or [place] Complete  Social Work Consult for [place] Planning/Counseling Complete  Palliative Care Screening Not Complete  Medication Review [employer]) Complete  Some recent data might be hidden

## 2020-02-19 NOTE — Plan of Care (Signed)

## 2020-02-19 NOTE — Progress Notes (Signed)
[facility] NEPHROLOGY PROGRESS NOTE  Assessment/ Plan: Pt is a 74 y.o. yo female with COPD, diastolic CHF, DM, HTN, CKD 3, severe AS status post TAVR, embolic stroke admitted with COPD exacerbation, hypoxia, consulted for AKI on CKD.  #Resolving acute kidney injury on CKD stage IIIb, : Likely hemodynamically mediated.  UA with chronic proteinuria, no RBC or WBC.  Kidney ultrasound with no obstruction however has cortical thinning.  Appears will return towards her previous baseline SCr.  No further suggestions. Can use diuretics prn, but anticipate further sig UOP as she recovers.  I don't think needs outpt f/u at [place] unless has further decline in GFR.    #Subnephrotic range proteinuria likely due to diabetic nephropathy: UA has no RBC or WBC.  PCP and monitor and refer to [facility] as needed.   #Tremor/myoclonic jerk: Presumed due to gabapentin toxicity.  Clinically improved.  Currently Neurontin on hold.    #Chronic diastolic CHF: stable, good UOP.  On 1L [place]  #CAP, acute hypoxic respiratory failure/COPD exacerbation: Rec antibiotics and steroid.  Per primary team.  #Hypertension: On Norvasc.  Lasix as needed as above.  Monitor blood pressure.  Will sign off for now.  Please call with any questions or concerns.  Pt does not need follow up with nephrology and PCP can follow labs upon DC.    Subjective: Robust UOP, downtrending SCr.  Stable K and HCO3.    Objective Vital signs in last 24 hours: Vitals:   02/19/20 0623 02/19/20 0738 02/19/20 0742 02/19/20 0748  BP: (!) 159/66     Pulse: 81     Resp: 18     Temp: 97.6 F (36.4 C)     TempSrc: Oral     SpO2:  98% 98% 98%  Weight:      Height:       Weight change: -5.4 kg  Intake/Output Summary (Last 24 hours) at 02/19/2020 0838 Last data filed at 02/19/2020 0735 Gross per 24 hour  Intake 1390 ml  Output 3653 ml  Net -2263 ml       Labs: Basic Metabolic Panel: Recent Labs  Lab 02/17/20 0615 02/18/20 0602  02/19/20 0450  NA 140 142 143  K 4.2 3.9 3.6  CL 107 106 104  CO2 24 27 31   GLUCOSE 79 214* 292*  BUN 94* 78* 60*  CREATININE 2.79* 2.26* 1.94*  CALCIUM 7.8* 8.1* 8.0*  PHOS 6.0* 4.5 3.6   Liver Function Tests: Recent Labs  Lab 02/17/20 0615 02/18/20 0602 02/19/20 0450  ALBUMIN 3.1* 3.0* 2.7*   No results for input(s): LIPASE, AMYLASE in the last 168 hours. No results for input(s): AMMONIA in the last 168 hours. CBC: Recent Labs  Lab 02/15/20 0412 02/18/20 0602  WBC 12.6* 18.3*  HGB 10.8* 9.6*  HCT 33.8* 29.9*  MCV 88.0 87.4  PLT 389 285   Cardiac Enzymes: Recent Labs  Lab 02/12/20 1354  CKTOTAL 31*   CBG: Recent Labs  Lab 02/18/20 1114 02/18/20 1624 02/18/20 2030 02/19/20 0235 02/19/20 0721  GLUCAP 324* 224* 381* 266* 263*    Iron Studies: No results for input(s): IRON, TIBC, TRANSFERRIN, FERRITIN in the last 72 hours. Studies/Results: No results found.  Medications: Infusions: . sodium chloride      Scheduled Medications: . amLODipine  10 mg Oral Daily  . arformoterol  15 mcg Nebulization BID  . aspirin EC  81 mg Oral Daily  . budesonide (PULMICORT) nebulizer solution  0.5 mg Nebulization BID  . heparin  5,000 Units Subcutaneous Q8H  . insulin aspart  0-5 Units Subcutaneous QHS  . insulin aspart  0-9 Units Subcutaneous TID WC  . insulin aspart  4 Units Subcutaneous TID WC  . ipratropium-albuterol  3 mL Nebulization BID  . levothyroxine  125 mcg Oral Daily  . mouth rinse  15 mL Mouth Rinse BID  . multivitamin-lutein  1 capsule Oral Daily  . simvastatin  20 mg Oral Daily  . sodium chloride flush  3 mL Intravenous Q12H  . timolol  1 drop Both Eyes BID  . vitamin B-12  1,000 mcg Oral Daily    have reviewed scheduled and prn medications.  Physical Exam: General:NAD, comfortable Heart:RRR, s1s2 nl Lungs:clear b/l, no crackle Abdomen:soft, Non-tender, non-distended Extremities: no lower extremity edema present Neurology: nonfocal. Awake,  alert.  Daxson Reffett B Sheanna Dail 02/19/2020,8:38 AM  LOS: 10 days

## 2020-02-19 NOTE — Significant Event (Signed)
02/19/2020 1:51 PM  Called to bedside for rapid response.  Pt was in process of being prepared to transfer to SNF.  Developed acute altered mentation and acute respiratory distress.  RR called and present at bedside, called for intubation.  Pt noted to be pale and hypotensive.   Dr Tomi Bamberger from ED intubated patient at bedside.  Pt transferred to ICU.  Portable CXR ordered.  Acute sepsis bundle ordered.  Consulted Dr. Constance Haw to assist with central line placement and pressors. Consulted Dr. Melvyn Novas with pulm critical care for assistance with vent management.  CT head, EKG, cardiac enzymes, lactic acid ordered.  I called and spoke with her daughter [name] and updated her on change in status.  She consented for central line placement.   Daughter [name] contact number [phone].   Critical care time spent 70 mins  Murvin Natal MD  How to contact the [facility] Attending or Consulting provider [place] or covering provider during after hours [place], for this patient?  1. Check the care team in [facility] and look for a) attending/consulting TRH provider listed and b) the [facility] team listed 2. Log into [email or web address] and use [place]'s universal password to access. If you do not have the password, please contact the hospital operator. 3. Locate the [facility] provider you are looking for under [facility] and page to a number that you can be directly reached. 4. If you still have difficulty reaching the provider, please page the [facility] Acute [facility] (Director on Call) for the Hospitalists listed on amion for assistance.

## 2020-02-19 NOTE — Progress Notes (Signed)
1312: RN and SLP staff.  Found patient in room unresponsive despite maximum verbal attempts and forceful sternum rubs. Patient last well known time was approximately 1230, patient was verbally stating that she did not want to eat. Patient ate 0% of her lunch. Dr. Wynetta Emery was paged to make aware of patient's change in status.  Blood glucose was 373, Blood Pressure 68/43, O2 Sat 85% on 1L.   1320: Rapid response was called.  #22G IV was started in Right AC.  Respiratory arrived and started bagging patient. Dr. Wynetta Emery and Dr. Tomi Bamberger arrived in room.   1324: BP 53/37, O2 Sat 80%  1328: 20 of Amidate given IV, 100 of Succinylcholine given IV  1329: Patient was intubated by Dr. Tomi Bamberger. IV fluids were started.  1330: BP 80/57, O2 sat 95%  1334: Patient transported via bed to ICU bed 9.

## 2020-02-19 NOTE — Progress Notes (Addendum)
SLP Cancellation Note  Patient Details Name: [name] [name] MRN: [id number] DOB: Dec 04, 1946   Cancelled treatment:       Reason Eval/Treat Not Completed: Fatigue/lethargy limiting ability to participate;Medical issues which prohibited therapy;Patient's level of consciousness. SLP attempted to rouse the Pt and found her to be completely unresponsive despite Max verbal cues, repositioning and max tactile cues. SLP immediately notified RN who came to bedside. Pt moved to higher level of care. ST to sign off, please re-consult if indicated. Thank you  [name] [name], CCC-SLP Speech Language Pathologist    Wende Bushy 02/19/2020, 1:21 PM

## 2020-02-19 NOTE — Progress Notes (Signed)
Daughter [name] who is listed in the chart called and gave an end of shift update. Night RN to call her when a bed is available for patient.

## 2020-02-19 NOTE — Progress Notes (Signed)
Patient belongings sent down to security. To be transported with patient when she gets a bed.

## 2020-02-19 NOTE — Discharge Summary (Signed)
Physician Discharge Summary  [name] [name] ZDG:[id number] DOB: December 03, 1946 DOA: 01/22/2020  PCP: Redmond School, MD  Admit date: 01/22/2020 Discharge date: 02/19/2020  Admitted From:  Home  Disposition:  SNF     Recommendations for Outpatient Follow-up:  1. Follow up with PCP in 2 weeks 2. Please obtain BMP in 2 weeks to monitor renal function.  3. Please monitor CBG 4 times per day and per facility protocols 4. Please follow up test TSH in 4 weeks.  5. Please follow up with cardiology in 2-4 weeks.   Discharge Condition: STABLE   CODE STATUS: FULL    Brief Hospitalization Summary: Please see all hospital notes, images, labs for full details of the hospitalization. ADMISSION HPI: [name] [name] is a 74 y.o. female with medical history significant of hypertension, hyperlipidemia, hypothyroidism, type 2 diabetes mellitus, CVA, chronic bronchitis, chronic kidney disease stage III, rheumatoid arthritis, and severe aortic stenosis status post TAVR on 01/10/2019 who presented to the ER with worsening shortness of breath, weakness.  Patient states that she had increased weakness over the last 3 days and is also been wheezing and having shortness of breath.  She had associated cough with clear sputum.  She says she used her albuterol inhaler at home multiple times with no relief.  She does have a nebulizer machine but it has been broken for about the past 2 months.  She says earlier today she felt so weak when she tried to walk that she got dizzy and sustained a fall with minor hit to the head against a door but no obvious injuries.  She did not have any loss of consciousness, focal deficits, blurring of vision.  She reported she had been hypoglycemic this morning when she woke up and her blood glucose was 50. ED Course:  Vital Signs reviewed on presentation, significant for temperature 97.7, blood pressure 142/52, heart rate 81, saturation 93% on room air at rest, dropping to the 80s on  ambulation. Labs reviewed, significant for sodium 138, potassium 3.2, BUN 23, creatinine 2.01, glucose 134, WBC 11.7, hemoglobin 11.7, hematocrit 36, platelets 366.  SARS COVID-19 screen is negative.  Urinalysis shows glucose, protein, negative for bacteria or WBCs. Imaging personally Reviewed, chest x-ray shows bilateral interstitial opacities, right worse than left with areas of subtle nodularity most prominent in the right upper lobe.  Findings likely secondary to atypical viral pneumonia.  COVID TEST IS NEGATIVE.   2D Echocardiogram 02/10/20 IMPRESSIONS 1. Left ventricular ejection fraction, by estimation, is 55 to 60%. The left ventricle has normal function. The left ventricle has no regional wall motion abnormalities. There is moderate concentric left ventricular hypertrophy. Left ventricular diastolic parameters are consistent with Grade I diastolic dysfunction (impaired relaxation). Elevated left ventricular end-diastolic pressure. 2. Right ventricular systolic function is normal. The right ventricular size is normal. 3. The mitral valve is normal in structure and function. Mild mitral annular calcification. Mild mitral valve regurgitation. No evidence of mitral stenosis. 4. The aortic valve has been repaired/replaced. There is trivial perivalvular AI ipresent. No aortic stenosis is present. There is a 23 mm Edwards Sapien prosthetic (TAVR) valve present in the aortic position. that is funcitoning normaly. Aortic valve mean gradient measures 12.0 mmHg. Aortic valve peak gradient measures 21.8 mmHg. Aortic valve area, by VTI measures 1.66 cm. Dimensionless index is 0.44 and the AVA is calculated to be 1.51cm2. 5. The inferior vena cava is normal in size with greater than 50% respiratory variability, suggesting right atrial pressure of 3 mmHg.  6. Compared to prior echo, the mean TAVR gradient has increased from 11 to 43mmhg and AVG is 1.51cm2 (was 1.88cm2 on last echo).  Brief  History: 74 year old female with a history of diastolic CHF, COPD, CKD stage III, diabetes mellitus type 2, hypertension, severe aortic stenosis status post TAVR 2/44/0102, embolic stroke presenting with 3-day history of generalized weakness, coughing, shortness of breath, and wheezing. The patient also had some unsteady gait and fell. She did not lose consciousness. The patient has not been able to use her nebulizer machine at home secondary to malfunctioning. She denies any fevers, chills, chest pain, vomiting, diarrhea, abdominal pain. Notably, the patient had a hospitalization from 01/10/2019 to2/02/2019 where she underwent TAVR. She had postoperative acute on chronic renal failure. Her postoperative course was also complicated by bilateral lower extremity weakness and myoclonus.She was seen by neurology at that time, and it was felt to be secondary to gabapentin toxicity in the setting of her acute on chronic renal failure. MRI of the brain did show cardioembolic stroke. CTA of the head and neck was negative for LVO. But it did show advanced intracranial atherosclerosis with moderate to severe right and moderate left intracranial ICA stenosis. The patient was subsequently discharged to [facility].  In the emergency department, the patient was afebrile hemodynamically stable with oxygen saturation 95% room air. WBC 11.7. Serum creatinine was 2.01. Chest x-ray showed bilateral interstitial opacities. The patient was started on Solu-Medrol for COPD exacerbation.  Assessment/Plan: Acute Respiratory Failure with hypoxia -due to PNA and COPD exacerbation which was treated  -stable on 2L  COPD exacerbation - resolved now -ContinuePulmicort -Completed IV Solu-Medrol -Continue duonebs -Advair BID and other bronchodilators as ordered.    R-lungopacity/CAP TREATED  -Completed course of empiric ceftriaxone and azithromycin -Procalcitonin<0.10 -CT chest--Patchy opacities at the right lung  apex with surrounding  ground-glass and peripheral predominance; bronchomalacia; circumferential esophageal thickening  Acute on chronic renal failure CKD stage IIIb -Baseline creatinine 1.5-1.6, creatinine trending down for last 2 days -Suspect the patient may have a degree of progression of her underlying renal disease -serum creatinine trending down now 1.94 -nephrology consulted and now signed off as renal function is recovering nicely.  Follow up with PCP to monitor renal function regularly.  -temporarily held diuretic, but now resuming as needed for fluid, edema -renal US--neg hydronephrosis  Chronic diastolic CHF -Daily weights -appears clinically euvolemic -2/27 echo--EF 55-60%, no WMA, G1DD, mild MR, s/pTAVR -Pt diuresed very well yesterday.  Now she is feeling better.  -Lasix 20 mg every other day as needed for fluid, edema, also on Jardiance 10 mg daily.   Uncontrolled diabetes mellitus type 2  Hypoglycemia from insulin  -Pt is eating and drinking much better today.  Follow closely.    - resuming home regimen at discharge.  No insulin.   - recommended monitoring CBG 4 times per day.    Hypertension -Continue amlodipine -controlled  Hypothyroidism -TSH 32.53 -pt reports not missing doses of levothyroxine, dose increased in hospital to 125 mcg daily. Recheck TSH in 4 weeks recommended.   Hyperlipidemia -Continue statin  Frequent falls -PT evaluation-->SNF -orthostatic vital signs neg  History of stroke -Continue aspirin  History of TAVR -Underwent TAVR 01/10/2019 -finished 6 months DAPT  Disposition Plan:  SNF placement  Family Communication:Daughter updated on 3/2, updated again 3/4, 3/6  Consultants:none  Code Status: FULL   DVT Prophylaxis: [place] Heparin   Procedures: As Listed in Progress Note Above  Antibiotics: Ceftriaxone 2/26>>>3/2 azithro 2/26>>>3/2  Discharge Diagnoses:  Principal Problem:   Acute exacerbation of  chronic bronchitis (HCC) Active Problems:   Essential hypertension   Hyperlipidemia   History of gastric ulcer   Chronic diastolic CHF (congestive heart failure) (HCC)   CKD (chronic kidney disease) stage 3, GFR 30-59 ml/min   Severe aortic stenosis   Rheumatoid arthritis (HCC)   Hypothyroidism   S/P TAVR (transcatheter aortic valve replacement)   Acute hypoxemic respiratory failure (HCC)   Acute renal failure superimposed on stage 3b chronic kidney disease ([place])   Discharge Instructions:  Allergies as of 02/19/2020   No Known Allergies     Medication List    STOP taking these medications   gabapentin 300 MG capsule Commonly known as: NEURONTIN   hydrochlorothiazide 25 MG tablet Commonly known as: HYDRODIURIL   metFORMIN 500 MG tablet Commonly known as: GLUCOPHAGE Replaced by: metFORMIN 500 MG 24 hr tablet     TAKE these medications   acetaminophen 325 MG tablet Commonly known as: TYLENOL Take 1-2 tablets (325-650 mg total) by mouth every 4 (four) hours as needed for mild pain.   ALPRAZolam 0.25 MG tablet Commonly known as: XANAX Take 1 tablet (0.25 mg total) by mouth at bedtime as needed for anxiety or sleep. What changed:   medication strength  how much to take  reasons to take this   amLODipine 10 MG tablet Commonly known as: NORVASC Take 1 tablet by mouth once daily   aspirin EC 81 MG tablet Take 81 mg by mouth daily.   beta carotene w/minerals tablet Take 1 tablet by mouth daily.   fluticasone 50 MCG/ACT nasal spray Commonly known as: FLONASE Place into both nostrils daily as needed for allergies or rhinitis.   Fluticasone-Salmeterol 250-50 MCG/DOSE Aepb Commonly known as: Advair Diskus Inhale 1 puff into the lungs 2 (two) times daily.   furosemide 20 MG tablet Commonly known as: LASIX Take 1 tablet (20 mg total) by mouth every other day as needed for fluid or edema. What changed:   when to take this  reasons to take this   glipiZIDE 10  MG tablet Commonly known as: GLUCOTROL Take 1 tablet (10 mg total) by mouth 2 (two) times daily before a meal.   guaiFENesin-dextromethorphan 100-10 MG/5ML syrup Commonly known as: ROBITUSSIN DM Take 10 mLs by mouth every 4 (four) hours as needed for cough.   HYDROcodone-acetaminophen 10-325 MG tablet Commonly known as: NORCO Take 1 tablet by mouth every 6 (six) hours as needed for severe pain. What changed:   when to take this  reasons to take this   ipratropium-albuterol 0.5-2.5 (3) MG/3ML Soln Commonly known as: DUONEB Take 3 mLs by nebulization every 4 (four) hours as needed (wheezing, cough, SOB.).   ipratropium-albuterol 0.5-2.5 (3) MG/3ML Soln Commonly known as: DUONEB Take 3 mLs by nebulization 2 (two) times daily.   Jardiance 10 MG Tabs tablet Generic drug: empagliflozin Take 10 mg by mouth every morning.   levothyroxine 125 MCG tablet Commonly known as: SYNTHROID Take 1 tablet (125 mcg total) by mouth daily. What changed:   medication strength  how much to take   metFORMIN 500 MG 24 hr tablet Commonly known as: GLUCOPHAGE-XR Take 2 tablets (1,000 mg total) by mouth 2 (two) times daily with a meal. Replaces: metFORMIN 500 MG tablet   polyethylene glycol 17 g packet Commonly known as: MIRALAX / GLYCOLAX Take 17 g by mouth daily.   potassium chloride SA 20 MEQ tablet Commonly known as: KLOR-CON Take 1 tablet (20 mEq  total) by mouth every other day as needed (give only when taking a lasix tablet). What changed:   when to take this  reasons to take this   simvastatin 20 MG tablet Commonly known as: ZOCOR Take 20 mg by mouth daily.   timolol 0.5 % ophthalmic solution Commonly known as: TIMOPTIC Place 1 drop into both eyes 2 (two) times daily.   Ventolin HFA 108 (90 Base) MCG/ACT inhaler Generic drug: albuterol Inhale 2 puffs into the lungs every 4 (four) hours as needed for wheezing or shortness of breath.   vitamin B-12 1000 MCG  tablet Commonly known as: CYANOCOBALAMIN Take 1,000 mcg by mouth daily.   zolpidem 5 MG tablet Commonly known as: AMBIEN Take 1 tablet (5 mg total) by mouth at bedtime as needed.      Contact information for after-discharge care    Destination    [facility] .   Service: Skilled Nursing Contact information: [address] [place] [phone]             No Known Allergies Allergies as of 02/19/2020   No Known Allergies     Medication List    STOP taking these medications   gabapentin 300 MG capsule Commonly known as: NEURONTIN   hydrochlorothiazide 25 MG tablet Commonly known as: HYDRODIURIL   metFORMIN 500 MG tablet Commonly known as: GLUCOPHAGE Replaced by: metFORMIN 500 MG 24 hr tablet     TAKE these medications   acetaminophen 325 MG tablet Commonly known as: TYLENOL Take 1-2 tablets (325-650 mg total) by mouth every 4 (four) hours as needed for mild pain.   ALPRAZolam 0.25 MG tablet Commonly known as: XANAX Take 1 tablet (0.25 mg total) by mouth at bedtime as needed for anxiety or sleep. What changed:   medication strength  how much to take  reasons to take this   amLODipine 10 MG tablet Commonly known as: NORVASC Take 1 tablet by mouth once daily   aspirin EC 81 MG tablet Take 81 mg by mouth daily.   beta carotene w/minerals tablet Take 1 tablet by mouth daily.   fluticasone 50 MCG/ACT nasal spray Commonly known as: FLONASE Place into both nostrils daily as needed for allergies or rhinitis.   Fluticasone-Salmeterol 250-50 MCG/DOSE Aepb Commonly known as: Advair Diskus Inhale 1 puff into the lungs 2 (two) times daily.   furosemide 20 MG tablet Commonly known as: LASIX Take 1 tablet (20 mg total) by mouth every other day as needed for fluid or edema. What changed:   when to take this  reasons to take this   glipiZIDE 10 MG tablet Commonly known  as: GLUCOTROL Take 1 tablet (10 mg total) by mouth 2 (two) times daily before a meal.   guaiFENesin-dextromethorphan 100-10 MG/5ML syrup Commonly known as: ROBITUSSIN DM Take 10 mLs by mouth every 4 (four) hours as needed for cough.   HYDROcodone-acetaminophen 10-325 MG tablet Commonly known as: NORCO Take 1 tablet by mouth every 6 (six) hours as needed for severe pain. What changed:   when to take this  reasons to take this   ipratropium-albuterol 0.5-2.5 (3) MG/3ML Soln Commonly known as: DUONEB Take 3 mLs by nebulization every 4 (four) hours as needed (wheezing, cough, SOB.).   ipratropium-albuterol 0.5-2.5 (3) MG/3ML Soln Commonly known as: DUONEB Take 3 mLs by nebulization 2 (two) times daily.   Jardiance 10 MG Tabs tablet Generic drug: empagliflozin Take 10 mg by  mouth every morning.   levothyroxine 125 MCG tablet Commonly known as: SYNTHROID Take 1 tablet (125 mcg total) by mouth daily. What changed:   medication strength  how much to take   metFORMIN 500 MG 24 hr tablet Commonly known as: GLUCOPHAGE-XR Take 2 tablets (1,000 mg total) by mouth 2 (two) times daily with a meal. Replaces: metFORMIN 500 MG tablet   polyethylene glycol 17 g packet Commonly known as: MIRALAX / GLYCOLAX Take 17 g by mouth daily.   potassium chloride SA 20 MEQ tablet Commonly known as: KLOR-CON Take 1 tablet (20 mEq total) by mouth every other day as needed (give only when taking a lasix tablet). What changed:   when to take this  reasons to take this   simvastatin 20 MG tablet Commonly known as: ZOCOR Take 20 mg by mouth daily.   timolol 0.5 % ophthalmic solution Commonly known as: TIMOPTIC Place 1 drop into both eyes 2 (two) times daily.   Ventolin HFA 108 (90 Base) MCG/ACT inhaler Generic drug: albuterol Inhale 2 puffs into the lungs every 4 (four) hours as needed for wheezing or shortness of breath.   vitamin B-12 1000 MCG tablet Commonly known as:  CYANOCOBALAMIN Take 1,000 mcg by mouth daily.   zolpidem 5 MG tablet Commonly known as: AMBIEN Take 1 tablet (5 mg total) by mouth at bedtime as needed.       Procedures/Studies: CT CHEST WO CONTRAST  Result Date: 02/10/2020 CLINICAL DATA:  Cough, persistent COPD exacerbation. Chest x-ray with questionable abnormalities. EXAM: CT CHEST WITHOUT CONTRAST TECHNIQUE: Multidetector CT imaging of the chest was performed following the standard protocol without IV contrast. COMPARISON:  Chest x-ray of 01/29/2020 and CT a chest abdomen and pelvis from 08/18/2018 FINDINGS: Cardiovascular: Calcified atherosclerotic changes and three-vessel coronary artery disease with evidence of prior trans arterial aortic valve replacement. Heart size mildly enlarged without pericardial effusion. Limited assessment of vascular structures due to lack of intravenous contrast. Central pulmonary vasculature mildly dilated at 2.7 cm. Mediastinum/Nodes: Mildly enlarged AP window lymph node 11 mm not changed since 2019. Scattered small lymph nodes throughout the chest none with pathologic enlargement. Hilar structures not well evaluated. No thoracic inlet adenopathy. No thoracic inlet adenopathy. Esophagus with mild circumferential thickening distally. Lungs/Pleura: Patchy opacities at the right lung apex with surrounding ground-glass and peripheral predominance. Some of these areas show small central solid-appearing nodules, largest measuring 4 mm (image 55, series 5) process appears confined to the right upper lobe. Airways are patent. Some distortion of the right hilum and mild narrowing of the bronchus intermedius is unchanged and may reflect mild bronchomalacia. Scattered areas of scarring in the lingula are stable. Upper Abdomen: Incidental imaging of upper abdominal contents showing no signs of acute abnormality. Musculoskeletal: No acute bone finding or destructive bone process. IMPRESSION: 1. Patchy opacities at the right lung  apex with surrounding ground-glass and peripheral predominance. Process appears confined to mainly to the right upper lobe. This would be atypical for metastases. Infection with viral or atypical process is considered. Correlation with blood cultures may be helpful to exclude hematogenous process in this patient with aortic valve replacement. 2. Stable mildly enlarged AP window lymph node. Attention on follow-up 3. Mild circumferential thickening of the distal esophagus may reflect esophagitis. 4. Calcified atherosclerotic changes and three-vessel coronary artery disease with evidence of prior trans arterial aortic valve replacement. Aortic Atherosclerosis (ICD10-I70.0). Electronically Signed   By: Zetta Bills M.D.   On: 02/10/2020 17:58   US RENAL  Result Date: 02/12/2020 CLINICAL DATA:  Acute renal failure superimposed upon chronic kidney disease, diabetes mellitus, hypertension, hyperlipidemia, horseshoe kidney EXAM: RENAL / URINARY TRACT ULTRASOUND COMPLETE COMPARISON:  CT abdomen 08/18/2018 FINDINGS: Right Kidney: Renal measurements: 14.4 x 4.9 x 6.7 cm = volume: 250 mL. Extremely poorly visualized due to cortical thinning, increased cortical echogenicity, and body habitus. No gross evidence of mass or hydronephrosis are identified on limited assessment. Left Kidney: Renal measurements: 10.0 x 4.1 x 4.9 cm = volume: 106 mL. Extremely poorly visualized due to cortical thinning, increased cortical echogenicity, and body habitus. No gross evidence of mass or hydronephrosis on limited assessment. Bladder: Decompressed by catheter, unable to evaluate Other: N/A IMPRESSION: Severe limitations of exam secondary to body habitus, bowel gas, and horseshoe anatomy. There appears to be cortical thinning and increased cortical echogenicity of the renal moieties bilaterally without gross evidence of mass or hydronephrosis on limited assessment. Electronically Signed   By: Lavonia Dana M.D.   On: 02/12/2020 14:59   DG  Chest Portable 1 View  Result Date: 01/27/2020 CLINICAL DATA:  Wheezing EXAM: PORTABLE CHEST 1 VIEW COMPARISON:  08/29/2019 FINDINGS: Stable mild cardiomegaly status post TAVR. Calcific aortic knob. Bilateral interstitial opacities, right worse than left with areas of subtle nodularity most prominent in the right upper lobe. No pleural effusion or pneumothorax. IMPRESSION: Bilateral interstitial opacities, right worse than left with areas of subtle nodularity most prominent in the right upper lobe. Findings could reflect atypical or viral pneumonia. Radiographic follow-up to resolution is recommended. Electronically Signed   By: Davina Poke D.O.   On: 01/27/2020 14:46   ECHOCARDIOGRAM COMPLETE  Result Date: 02/10/2020    ECHOCARDIOGRAM REPORT   Patient Name:   EMANI TAUSSIG Date of Exam: 02/10/2020 Medical Rec #:  [id number]         Height:       57.0 in Accession #:    [id number]        Weight:       150.4 lb Date of Birth:  19-Jun-1946         BSA:          1.593 m Patient Age:    54 years          BP:           128/61 mmHg Patient Gender: F                 HR:           75 bpm. Exam Location:  Inpatient Procedure: 2D Echo and Strain Analysis Indications:    Aortic Stenosis 424.1 / 135.0  History:        Patient has prior history of Echocardiogram examinations, most                 recent 01/11/2019. Stroke, Signs/Symptoms:Shortness of Breath;                 Risk Factors:Hypertension, Diabetes and Dyslipidemia. Pneumonia.                 Chronic kidney disease. Bilateral carotid stenosis.                 Aortic Valve: 23 mm Edwards Sapien prosthetic, stented (TAVR)                 valve is present in the aortic position. Procedure Date:  01/10/2019.  Sonographer:    Darlina Sicilian RDCS Referring Phys: [id number] [place]  1. Left ventricular ejection fraction, by estimation, is 55 to 60%. The left ventricle has normal function. The left ventricle has no regional wall  motion abnormalities. There is moderate concentric left ventricular hypertrophy. Left ventricular diastolic parameters are consistent with Grade I diastolic dysfunction (impaired relaxation). Elevated left ventricular end-diastolic pressure.  2. Right ventricular systolic function is normal. The right ventricular size is normal.  3. The mitral valve is normal in structure and function. Mild mitral annular calcification. Mild mitral valve regurgitation. No evidence of mitral stenosis.  4. The aortic valve has been repaired/replaced. There is trivial perivalvular AI ipresent. No aortic stenosis is present. There is a 23 mm Edwards Sapien prosthetic (TAVR) valve present in the aortic position. that is funcitoning normaly. Aortic valve mean gradient measures 12.0 mmHg. Aortic valve peak gradient measures 21.8 mmHg. Aortic valve area, by VTI measures 1.66 cm. Dimensionless index is 0.44 and the AVA is calculated to be 1.51cm2.  5. The inferior vena cava is normal in size with greater than 50% respiratory variability, suggesting right atrial pressure of 3 mmHg.  6. Compared to prior echo, the mean TAVR gradient has increased from 11 to 38mmhg and AVG is 1.51cm2 (was 1.88cm2 on last echo). FINDINGS  Left Ventricle: Left ventricular ejection fraction, by estimation, is 55 to 60%. The left ventricle has normal function. The left ventricle has no regional wall motion abnormalities. The left ventricular internal cavity size was normal in size. There is  moderate concentric left ventricular hypertrophy. Left ventricular diastolic parameters are consistent with Grade I diastolic dysfunction (impaired relaxation). Elevated left ventricular end-diastolic pressure. Right Ventricle: The right ventricular size is normal. No increase in right ventricular wall thickness. Right ventricular systolic function is normal. Left Atrium: Left atrial size was normal in size. Right Atrium: Right atrial size was normal in size. Pericardium:  Trivial pericardial effusion is present. The pericardial effusion is posterior to the left ventricle. Mitral Valve: The mitral valve is normal in structure and function. Normal mobility of the mitral valve leaflets. Mild mitral annular calcification. Mild mitral valve regurgitation. No evidence of mitral valve stenosis. Tricuspid Valve: The tricuspid valve is normal in structure. Tricuspid valve regurgitation is not demonstrated. No evidence of tricuspid stenosis. Aortic Valve: The aortic valve has been repaired/replaced. Aortic valve regurgitation trivial perivalvular AI is present. No aortic stenosis is present. Aortic valve mean gradient measures 12.0 mmHg. Aortic valve peak gradient measures 21.8 mmHg. Aortic valve area, by VTI measures 1.66 cm. There is a 23 mm Edwards Sapien prosthetic, stented (TAVR) valve present in the aortic position. Procedure Date: 01/10/2019. Pulmonic Valve: The pulmonic valve was normal in structure. Pulmonic valve regurgitation is not visualized. No evidence of pulmonic stenosis. Aorta: The aortic root is normal in size and structure. Venous: The inferior vena cava is normal in size with greater than 50% respiratory variability, suggesting right atrial pressure of 3 mmHg. IAS/Shunts: No atrial level shunt detected by color flow Doppler.  LEFT VENTRICLE PLAX 2D LVIDd:         4.79 cm      Diastology LVIDs:         3.43 cm      LV e' lateral:   4.35 cm/s LV PW:         1.22 cm      LV E/e' lateral: 29.7 LV IVS:        1.34 cm  LV e' medial:    3.00 cm/s LVOT diam:     2.20 cm      LV E/e' medial:  43.0 LV SV:         82 LV SV Index:   52 LVOT Area:     3.80 cm  LV Volumes (MOD) LV vol d, MOD A2C: 99.5 ml LV vol d, MOD A4C: 125.0 ml LV vol s, MOD A2C: 50.2 ml LV vol s, MOD A4C: 56.4 ml LV SV MOD A2C:     49.3 ml LV SV MOD A4C:     125.0 ml LV SV MOD BP:      58.3 ml RIGHT VENTRICLE RV S prime:     11.20 cm/s TAPSE (M-mode): 1.6 cm LEFT ATRIUM             Index       RIGHT ATRIUM            Index LA diam:        3.60 cm 2.26 cm/m  RA Area:     15.20 cm LA Vol (A2C):   55.7 ml 34.96 ml/m RA Volume:   34.70 ml  21.78 ml/m LA Vol (A4C):   49.6 ml 31.13 ml/m LA Biplane Vol: 53.2 ml 33.39 ml/m  AORTIC VALVE AV Area (Vmax):    1.51 cm AV Area (Vmean):   1.61 cm AV Area (VTI):     1.66 cm AV Vmax:           233.50 cm/s AV Vmean:          162.000 cm/s AV VTI:            0.496 m AV Peak Grad:      21.8 mmHg AV Mean Grad:      12.0 mmHg LVOT Vmax:         92.50 cm/s LVOT Vmean:        68.400 cm/s LVOT VTI:          0.217 m LVOT/AV VTI ratio: 0.44  AORTA Ao Root diam: 3.10 cm Ao Asc diam:  3.00 cm MITRAL VALVE MV Area (PHT): 3.21 cm     SHUNTS MV Decel Time: 236 msec     Systemic VTI:  0.22 m MV E velocity: 129.00 cm/s  Systemic Diam: 2.20 cm MV A velocity: 144.00 cm/s MV E/A ratio:  0.90 Fransico Him MD Electronically signed by Fransico Him MD Signature Date/Time: 02/10/2020/5:45:00 PM    Final      Subjective: Pt without complaints.  No SOB.  No CP.  Eating and drinking well.    Discharge Exam: Vitals:   02/19/20 0748 02/19/20 0951  BP:  (!) 115/59  Pulse:  (!) 106  Resp:  19  Temp:  (!) 97.5 F (36.4 C)  SpO2: 98% 100%   Vitals:   02/19/20 0738 02/19/20 0742 02/19/20 0748 02/19/20 0951  BP:    (!) 115/59  Pulse:    (!) 106  Resp:    19  Temp:    (!) 97.5 F (36.4 C)  TempSrc:    Oral  SpO2: 98% 98% 98% 100%  Weight:      Height:        General:  Awake, alert, cooperative, pleasant.   HEENT: NCAT. Neck supple. No JVD.   Cardiovascular: normal s1,s2 sounds.   Respiratory: rare expiratory wheezes heard. Rare bibasilar crackles.   Abdomen: Soft/+BS, non tender, non distended, no guarding  Extremities: much improved subcut edema BUEs  L>R   The results of significant diagnostics from this hospitalization (including imaging, microbiology, ancillary and laboratory) are listed below for reference.     Microbiology: Recent Results (from the past 240 hour(s))   SARS CORONAVIRUS 2 (TAT 6-24 HRS) Nasopharyngeal Nasopharyngeal Swab     Status: None   Collection Time: 01/20/2020  7:58 PM   Specimen: Nasopharyngeal Swab  Result Value Ref Range Status   SARS Coronavirus 2 NEGATIVE NEGATIVE Final    Comment: (NOTE) SARS-CoV-2 target nucleic acids are NOT DETECTED. The SARS-CoV-2 RNA is generally detectable in upper and lower respiratory specimens during the acute phase of infection. Negative results do not preclude SARS-CoV-2 infection, do not rule out co-infections with other pathogens, and should not be used as the sole basis for treatment or other patient management decisions. Negative results must be combined with clinical observations, patient history, and epidemiological information. The expected result is Negative. Fact Sheet for Patients: SugarRoll.be Fact Sheet for Healthcare Providers: https://www.woods-mathews.com/ This test is not yet approved or cleared by the [place] FDA and  has been authorized for detection and/or diagnosis of SARS-CoV-2 by FDA under an Emergency Use Authorization (EUA). This EUA will remain  in effect (meaning this test can be used) for the duration of the COVID-19 declaration under Section 56 4(b)(1) of the Act, 21 U.S.C. section 360bbb-3(b)(1), unless the authorization is terminated or revoked sooner. Performed at [facility], [place] [address] [place], [place] [address]   Respiratory Panel by PCR     Status: Abnormal   Collection Time: 02/11/20  6:10 PM   Specimen: Nasopharyngeal Swab; Respiratory  Result Value Ref Range Status   Adenovirus NOT DETECTED NOT DETECTED Final   Coronavirus 229E NOT DETECTED NOT DETECTED Final    Comment: (NOTE) The Coronavirus on the Respiratory Panel, DOES NOT test for the novel  Coronavirus (2019 nCoV)    Coronavirus HKU1 NOT DETECTED NOT DETECTED Final   Coronavirus NL63 NOT DETECTED NOT DETECTED Final   Coronavirus OC43  NOT DETECTED NOT DETECTED Final   Metapneumovirus NOT DETECTED NOT DETECTED Final   Rhinovirus / Enterovirus DETECTED (A) NOT DETECTED Final   Influenza A NOT DETECTED NOT DETECTED Final   Influenza B NOT DETECTED NOT DETECTED Final   Parainfluenza Virus 1 NOT DETECTED NOT DETECTED Final   Parainfluenza Virus 2 NOT DETECTED NOT DETECTED Final   Parainfluenza Virus 3 NOT DETECTED NOT DETECTED Final   Parainfluenza Virus 4 NOT DETECTED NOT DETECTED Final   Respiratory Syncytial Virus NOT DETECTED NOT DETECTED Final   Bordetella pertussis NOT DETECTED NOT DETECTED Final   Chlamydophila pneumoniae NOT DETECTED NOT DETECTED Final   Mycoplasma pneumoniae NOT DETECTED NOT DETECTED Final    Comment: Performed at [facility], [place]. [address] [place], [place] [address]  Expectorated sputum assessment w rflx to resp cult     Status: None (Preliminary result)   Collection Time: 02/12/20  2:27 PM   Specimen: Sputum  Result Value Ref Range Status   Specimen Description SPUTUM  Final   Special Requests NONE  Final   Sputum evaluation   Final    THIS SPECIMEN IS ACCEPTABLE FOR SPUTUM CULTURE Performed at [facility] Performed at [facility], [address], [place], [place] [address]    Report Status PENDING  Incomplete  Culture, respiratory     Status: None   Collection Time: 02/12/20  2:27 PM   Specimen: SPU  Result Value Ref Range Status  Specimen Description   Final    SPUTUM Performed at [facility], [address] [place], [place] [address]    Special Requests   Final    NONE Reflexed from [id number] Performed at [facility], [address] [place], [place] [address]    Gram Stain NO WBC SEEN RARE GRAM POSITIVE COCCI   Final   Culture   Final    Consistent with normal respiratory flora. Performed at [facility], [place] [address] [place], [place] [address]    Report Status 02/14/2020 FINAL  Final  SARS CORONAVIRUS 2 (TAT 6-24 HRS) Nasopharyngeal Nasopharyngeal  Swab     Status: None   Collection Time: 02/14/20 11:57 AM   Specimen: Nasopharyngeal Swab  Result Value Ref Range Status   SARS Coronavirus 2 NEGATIVE NEGATIVE Final    Comment: (NOTE) SARS-CoV-2 target nucleic acids are NOT DETECTED. The SARS-CoV-2 RNA is generally detectable in upper and lower respiratory specimens during the acute phase of infection. Negative results do not preclude SARS-CoV-2 infection, do not rule out co-infections with other pathogens, and should not be used as the sole basis for treatment or other patient management decisions. Negative results must be combined with clinical observations, patient history, and epidemiological information. The expected result is Negative. Fact Sheet for Patients: SugarRoll.be Fact Sheet for Healthcare Providers: https://www.woods-mathews.com/ This test is not yet approved or cleared by the [place] FDA and  has been authorized for detection and/or diagnosis of SARS-CoV-2 by FDA under an Emergency Use Authorization (EUA). This EUA will remain  in effect (meaning this test can be used) for the duration of the COVID-19 declaration under Section 56 4(b)(1) of the Act, 21 U.S.C. section 360bbb-3(b)(1), unless the authorization is terminated or revoked sooner. Performed at [facility], [place] [address] [place], [place] [address]   SARS CORONAVIRUS 2 (TAT 6-24 HRS) Nasopharyngeal Nasopharyngeal Swab     Status: None   Collection Time: 02/18/20  6:36 PM   Specimen: Nasopharyngeal Swab  Result Value Ref Range Status   SARS Coronavirus 2 NEGATIVE NEGATIVE Final    Comment: (NOTE) SARS-CoV-2 target nucleic acids are NOT DETECTED. The SARS-CoV-2 RNA is generally detectable in upper and lower respiratory specimens during the acute phase of infection. Negative results do not preclude SARS-CoV-2 infection, do not rule out co-infections with other pathogens, and should not be used as  the sole basis for treatment or other patient management decisions. Negative results must be combined with clinical observations, patient history, and epidemiological information. The expected result is Negative. Fact Sheet for Patients: SugarRoll.be Fact Sheet for Healthcare Providers: https://www.woods-mathews.com/ This test is not yet approved or cleared by the [place] FDA and  has been authorized for detection and/or diagnosis of SARS-CoV-2 by FDA under an Emergency Use Authorization (EUA). This EUA will remain  in effect (meaning this test can be used) for the duration of the COVID-19 declaration under Section 56 4(b)(1) of the Act, 21 U.S.C. section 360bbb-3(b)(1), unless the authorization is terminated or revoked sooner. Performed at [facility], [place] [address] [place],  [address]      Labs: BNP (last 3 results) Recent Labs    02/10/20 0918  BNP 195.0*   Basic Metabolic Panel: Recent Labs  Lab 02/15/20 0412 02/16/20 0408 02/17/20 0615 02/18/20 0602 02/19/20 0450  NA 134* 138 140 142 143  K 4.8 4.7 4.2 3.9 3.6  CL 101 106 107 106 104  CO2 20* 22 24 27  31  GLUCOSE 203* 82 79 214* 292*  BUN 101* 103* 94* 78* 60*  CREATININE 3.52* 3.23* 2.79* 2.26* 1.94*  CALCIUM 7.7* 7.6* 7.8* 8.1* 8.0*  PHOS 6.4* 6.4* 6.0* 4.5 3.6   Liver Function Tests: Recent Labs  Lab 02/15/20 0412 02/16/20 0408 02/17/20 0615 02/18/20 0602 02/19/20 0450  ALBUMIN 3.4* 3.0* 3.1* 3.0* 2.7*   No results for input(s): LIPASE, AMYLASE in the last 168 hours. No results for input(s): AMMONIA in the last 168 hours. CBC: Recent Labs  Lab 02/15/20 0412 02/18/20 0602  WBC 12.6* 18.3*  HGB 10.8* 9.6*  HCT 33.8* 29.9*  MCV 88.0 87.4  PLT 389 285   Cardiac Enzymes: Recent Labs  Lab 02/12/20 1354  CKTOTAL 31*   BNP: Invalid input(s): POCBNP CBG: Recent Labs  Lab 02/18/20 1624 02/18/20 2030 02/19/20 0235  02/19/20 0721 02/19/20 1111  GLUCAP 224* 381* 266* 263* 428*   D-Dimer No results for input(s): DDIMER in the last 72 hours. Hgb A1c No results for input(s): HGBA1C in the last 72 hours. Lipid Profile No results for input(s): CHOL, HDL, LDLCALC, TRIG, CHOLHDL, LDLDIRECT in the last 72 hours. Thyroid function studies No results for input(s): TSH, T4TOTAL, T3FREE, THYROIDAB in the last 72 hours.  Invalid input(s): FREET3 Anemia work up No results for input(s): VITAMINB12, FOLATE, FERRITIN, TIBC, IRON, RETICCTPCT in the last 72 hours. Urinalysis    Component Value Date/Time   COLORURINE STRAW (A) 02/14/2020 1159   APPEARANCEUR CLEAR 02/14/2020 1159   LABSPEC 1.011 02/14/2020 1159   PHURINE 6.0 02/14/2020 1159   GLUCOSEU 150 (A) 02/14/2020 1159   HGBUR NEGATIVE 02/14/2020 1159   BILIRUBINUR NEGATIVE 02/14/2020 1159   KETONESUR NEGATIVE 02/14/2020 1159   PROTEINUR 100 (A) 02/14/2020 1159   UROBILINOGEN 0.2 09/23/2013 1926   NITRITE NEGATIVE 02/14/2020 1159   LEUKOCYTESUR NEGATIVE 02/14/2020 1159   Sepsis Labs Invalid input(s): PROCALCITONIN,  WBC,  LACTICIDVEN Microbiology Recent Results (from the past 240 hour(s))  SARS CORONAVIRUS 2 (TAT 6-24 HRS) Nasopharyngeal Nasopharyngeal Swab     Status: None   Collection Time: 01/28/2020  7:58 PM   Specimen: Nasopharyngeal Swab  Result Value Ref Range Status   SARS Coronavirus 2 NEGATIVE NEGATIVE Final    Comment: (NOTE) SARS-CoV-2 target nucleic acids are NOT DETECTED. The SARS-CoV-2 RNA is generally detectable in upper and lower respiratory specimens during the acute phase of infection. Negative results do not preclude SARS-CoV-2 infection, do not rule out co-infections with other pathogens, and should not be used as the sole basis for treatment or other patient management decisions. Negative results must be combined with clinical observations, patient history, and epidemiological information. The expected result is  Negative. Fact Sheet for Patients: SugarRoll.be Fact Sheet for Healthcare Providers: https://www.woods-mathews.com/ This test is not yet approved or cleared by the [place] FDA and  has been authorized for detection and/or diagnosis of SARS-CoV-2 by FDA under an Emergency Use Authorization (EUA). This EUA will remain  in effect (meaning this test can be used) for the duration of the COVID-19 declaration under Section 56 4(b)(1) of the Act, 21 U.S.C. section 360bbb-3(b)(1), unless the authorization is terminated or revoked sooner. Performed at [facility], [place] [address], [place], [place] [address]   Respiratory Panel by PCR     Status: Abnormal   Collection Time: 02/11/20  6:10 PM   Specimen: Nasopharyngeal Swab; Respiratory  Result Value Ref Range Status   Adenovirus NOT DETECTED NOT DETECTED Final   Coronavirus 229E NOT DETECTED NOT  DETECTED Final    Comment: (NOTE) The Coronavirus on the Respiratory Panel, DOES NOT test for the novel  Coronavirus (2019 nCoV)    Coronavirus HKU1 NOT DETECTED NOT DETECTED Final   Coronavirus NL63 NOT DETECTED NOT DETECTED Final   Coronavirus OC43 NOT DETECTED NOT DETECTED Final   Metapneumovirus NOT DETECTED NOT DETECTED Final   Rhinovirus / Enterovirus DETECTED (A) NOT DETECTED Final   Influenza A NOT DETECTED NOT DETECTED Final   Influenza B NOT DETECTED NOT DETECTED Final   Parainfluenza Virus 1 NOT DETECTED NOT DETECTED Final   Parainfluenza Virus 2 NOT DETECTED NOT DETECTED Final   Parainfluenza Virus 3 NOT DETECTED NOT DETECTED Final   Parainfluenza Virus 4 NOT DETECTED NOT DETECTED Final   Respiratory Syncytial Virus NOT DETECTED NOT DETECTED Final   Bordetella pertussis NOT DETECTED NOT DETECTED Final   Chlamydophila pneumoniae NOT DETECTED NOT DETECTED Final   Mycoplasma pneumoniae NOT DETECTED NOT DETECTED Final    Comment: Performed at [facility], [place] [address],  [place], [place] [address]  Expectorated sputum assessment w rflx to resp cult     Status: None (Preliminary result)   Collection Time: 02/12/20  2:27 PM   Specimen: Sputum  Result Value Ref Range Status   Specimen Description SPUTUM  Final   Special Requests NONE  Final   Sputum evaluation   Final    THIS SPECIMEN IS ACCEPTABLE FOR SPUTUM CULTURE Performed at [facility] Performed at [facility], [address] [place], [place] [address]    Report Status PENDING  Incomplete  Culture, respiratory     Status: None   Collection Time: 02/12/20  2:27 PM   Specimen: SPU  Result Value Ref Range Status   Specimen Description   Final    SPUTUM Performed at [facility], [address], [place], [place] [address]    Special Requests   Final    NONE Reflexed from [id number] Performed at [facility], [address] [place], [place] [address]    Gram Stain NO WBC SEEN RARE GRAM POSITIVE COCCI   Final   Culture   Final    Consistent with normal respiratory flora. Performed at [facility], [place] [address] [place], [place] [address]    Report Status 02/14/2020 FINAL  Final  SARS CORONAVIRUS 2 (TAT 6-24 HRS) Nasopharyngeal Nasopharyngeal Swab     Status: None   Collection Time: 02/14/20 11:57 AM   Specimen: Nasopharyngeal Swab  Result Value Ref Range Status   SARS Coronavirus 2 NEGATIVE NEGATIVE Final    Comment: (NOTE) SARS-CoV-2 target nucleic acids are NOT DETECTED. The SARS-CoV-2 RNA is generally detectable in upper and lower respiratory specimens during the acute phase of infection. Negative results do not preclude SARS-CoV-2 infection, do not rule out co-infections with other pathogens, and should not be used as the sole basis for treatment or other patient management decisions. Negative results must be combined with clinical observations, patient history, and epidemiological information. The expected result is Negative. Fact Sheet for  Patients: SugarRoll.be Fact Sheet for Healthcare Providers: https://www.woods-mathews.com/ This test is not yet approved or cleared by the [place] FDA and  has been authorized for detection and/or diagnosis of SARS-CoV-2 by FDA under an Emergency Use Authorization (EUA). This EUA will remain  in effect (meaning this test can be used) for the duration of the COVID-19 declaration under Section 56 4(b)(1) of the Act, 21 U.S.C. section 360bbb-3(b)(1), unless the authorization is terminated or  revoked sooner. Performed at [facility], [place] [address] [place], [place] [address]   SARS CORONAVIRUS 2 (TAT 6-24 HRS) Nasopharyngeal Nasopharyngeal Swab     Status: None   Collection Time: 02/18/20  6:36 PM   Specimen: Nasopharyngeal Swab  Result Value Ref Range Status   SARS Coronavirus 2 NEGATIVE NEGATIVE Final    Comment: (NOTE) SARS-CoV-2 target nucleic acids are NOT DETECTED. The SARS-CoV-2 RNA is generally detectable in upper and lower respiratory specimens during the acute phase of infection. Negative results do not preclude SARS-CoV-2 infection, do not rule out co-infections with other pathogens, and should not be used as the sole basis for treatment or other patient management decisions. Negative results must be combined with clinical observations, patient history, and epidemiological information. The expected result is Negative. Fact Sheet for Patients: SugarRoll.be Fact Sheet for Healthcare Providers: https://www.woods-mathews.com/ This test is not yet approved or cleared by the [place] FDA and  has been authorized for detection and/or diagnosis of SARS-CoV-2 by FDA under an Emergency Use Authorization (EUA). This EUA will remain  in effect (meaning this test can be used) for the duration of the COVID-19 declaration under Section 56 4(b)(1) of the Act, 21 U.S.C. section  360bbb-3(b)(1), unless the authorization is terminated or revoked sooner. Performed at [facility], [place] [address], [place], [place] [address]     Time coordinating discharge: 35 mins  SIGNED:  Irwin Brakeman, MD  [facility] 02/19/2020, 12:13 PM How to contact the [facility] Attending or Consulting provider [place] or covering provider during after hours [place], for this patient?  1. Check the care team in [facility] and look for a) attending/consulting [facility] provider listed and b) the [facility] team listed 2. Log into [email or web address] and use [place]'s universal password to access. If you do not have the password, please contact the hospital operator. 3. Locate the [facility] provider you are looking for under [facility] and page to a number that you can be directly reached. 4. If you still have difficulty reaching the provider, please page the [facility] (Director on Call) for the Hospitalists listed on amion for assistance.

## 2020-02-19 NOTE — Progress Notes (Signed)
CCM recs: 4 units pRBC emergent release rapid infuse 4 units FFP 1 unit plts 1g calcium chloride 10mg  vitamin K Transfer to cone when able  Erskine Emery MD

## 2020-02-19 NOTE — Progress Notes (Addendum)
Pharmacy Antibiotic Note  [name] [name] is a 74 y.o. female admitted on 02/11/2020 with sepsis.  Pharmacy has been consulted for Vancomycin and cefepime dosing.  Plan: Vancomycin 1500mg  IV loading dose, then 750mg  IV every 24 hours.  Goal trough 15-20 mcg/mL.  Cefepime 2gm IV q24h Metronidazole 500mg  IV q8h F/u cxs and clinical progress Monitor V/S, labs and levels as indicated  Height: 4\' 9"  (144.8 cm) Weight: 153 lb 10.6 oz (69.7 kg) IBW/kg (Calculated) : 38.6  Temp (24hrs), Avg:98.1 F (36.7 C), Min:97.5 F (36.4 C), Max:98.8 F (37.1 C)  Recent Labs  Lab 02/15/20 0412 02/16/20 0408 02/17/20 0615 02/18/20 0602 02/19/20 0450  WBC 12.6*  --   --  18.3*  --   CREATININE 3.52* 3.23* 2.79* 2.26* 1.94*    Estimated Creatinine Clearance: 20.5 mL/min (A) (by C-G formula based on SCr of 1.94 mg/dL (H)).    No Known Allergies  Antimicrobials this admission: Vancomycin 3/8 >>  Cefepime 3/8>> Metronidazole 3/8>>  Azith 2/26 >>3/3 CTX 2/26 >> 3/3  Dose adjustments this admission: prn  Microbiology results: 3/8 BCx: pending 3/8 UCx: TBC  MRSA PCR:   Thank you for allowing pharmacy to be a part of this patient's care.  Isac Sarna, BS Pharm D, [place] Clinical Pharmacist Pager [phone] 02/19/2020 1:51 PM

## 2020-02-19 NOTE — H&P (Addendum)
PULMONARY / CRITICAL CARE MEDICINE Initial Consultation    NAME:  [name] [name], MRN:  [id number], DOB:  September 04, 1946, LOS: 10 ADMISSION DATE:  02/06/2020, CONSULTATION DATE:  02/19/20 REFERRING MD:  Wynetta Emery, [facility], CHIEF COMPLAINT: [email or web address]  BRIEF HISTORY:    51 yowf never smoker who carries dx of copd but had no airflow obst on pfts 10/2018 and admitted 2/26 [facility] with 3 days weak/falling spells aand sob / cough rx as CAP / aki and improved and ready for d/c am 3/8 but found agonal /sbp in 60's around 1 pm  and intubated and transported to ER where easy to ventilate/ oxygenate with persistent low bp so PCCM consulted by Dr Wynetta Emery.   HISTORY OF PRESENT ILLNESS   Per HPI: HPI: [name] [name] is a 74 y.o. female with medical history significant of hypertension, hyperlipidemia, hypothyroidism, type 2 diabetes mellitus, CVA, chronic bronchitis, chronic kidney disease stage III, rheumatoid arthritis, and severe aortic stenosis status post TAVR on 01/10/2019 who presented to the ER with worsening shortness of breath, weakness.  Patient states that she had increased weakness over the last 3 days and is also been wheezing and having shortness of breath.  She had associated cough with clear sputum.  She says she used her albuterol inhaler at home multiple times with no relief.  She does have a nebulizer machine but it has been broken for about the past 2 months.  She says earlier today she felt so weak when she tried to walk that she got dizzy and sustained a fall with minor hit to the head against a door but no obvious injuries.  She did not have any loss of consciousness, focal deficits, blurring of vision.  She reported she had been hypoglycemic this morning when she woke up and her blood glucose was 50. ED Course:  Vital Signs reviewed on presentation, significant for temperature 97.7, blood pressure 142/52, heart rate 81, saturation 93% on room air at rest, dropping to the 80s on ambulation. Labs reviewed,  significant for sodium 138, potassium 3.2, BUN 23, creatinine 2.01, glucose 134, WBC 11.7, hemoglobin 11.7, hematocrit 36, platelets 366.  SARS COVID-19 screen is negative.  Urinalysis shows glucose, protein, negative for bacteria or WBCs. Imaging personally Reviewed, chest x-ray shows bilateral interstitial opacities, right worse than left with areas of subtle nodularity most prominent in the right upper lobe.  Findings likely secondary to atypical viral pneumonia.  Per d/c summary done just prior to her event 3/8   -due to PNA and COPD exacerbation which was treated  -stable on 2L  COPD exacerbation- resolved now -ContinuePulmicort -Completed IV Solu-Medrol -Continue duonebs -Advair BID and other bronchodilators as ordered.    R-lungopacity/CAP TREATED  -Completed course of empiric ceftriaxone and azithromycin -Procalcitonin<0.10 -CT chest--Patchy opacities at the right lung apex with surrounding  ground-glass and peripheral predominance; bronchomalacia; circumferential esophageal thickening  Acute on chronic renal failure CKD stage IIIb -Baseline creatinine 1.5-1.6,creatinine trending down for last 2 days -Suspect the patient may have a degree of progression of her underlying renal disease -serum creatininetrending down now 1.94 -nephrology consultedand now signed off as renal function is recovering nicely.  Follow up with PCP to monitor renal function regularly.  -temporarilyheld diuretic, but now resuming as needed for fluid, edema -renal US--neg hydronephrosis  Chronic diastolic CHF -Daily weights -appears clinically euvolemic -2/27 echo--EF 55-60%, no WMA, G1DD, mild MR, s/pTAVR -Ptdiuresed very well yesterday.  Now she is feeling better.  -Lasix 20 mg every other day as  needed for fluid, edema, also on Jardiance 10 mg daily.   Uncontrolled diabetes mellitus type 2  Hypoglycemia from insulin  -Pt is eating and drinking much better today. Follow closely.   - resuming home regimen at discharge.  No insulin.   - recommended monitoring CBG 4 times per day.    Hypertension -Continue amlodipine -controlled  Hypothyroidism -TSH 32.53 -pt reports not missing doses of levothyroxine, dose increased in hospital to 125 mcg daily. Recheck TSH in 4 weeks recommended.   Hyperlipidemia -Continue statin  Frequent falls -PT evaluation-->SNF -orthostatic vital signs neg  History of stroke -Continue aspirin  History of TAVR -Underwent TAVR 01/10/2019 -finished 6 months DAPT  Disposition Plan:  SNF placement     SIGNIFICANT EVENTS:  Agonal with low bp 3/8 1pm > intubated    STUDIES:   Chest 2/27 1. Patchy opacities at the right lung apex with surrounding ground-glass and peripheral predominance. Process appears confined to mainly to the right upper lobe. This would be atypical for metastases. Infection with viral or atypical process is considered. Correlation with blood cultures may be helpful to exclude hematogenous process in this patient with aortic valve replacement. 2. Stable mildly enlarged AP window lymph node. Attention on follow-up 3. Mild circumferential thickening of the distal esophagus may reflect esophagitis. 4. Calcified atherosclerotic changes and three-vessel coronary artery disease with evidence of prior trans arterial aortic valve Replacement. Echo  7/67: grade I diastolic dysfunction and ok AV/ nl RA  Micro/CULTURES:  PCT  2/27  < 0.10  Sputum 3/1  Nl flora Covid 19  3/3 neg Covid 19 3/7 neg  MRSA  PCR  3/8 >>> BC x 2  3/8 >>>  PCT 3/8 >>>  ANTIBIOTICS:  Zmax 2/26 - 3.2  Roc 2/26 - 3/2  Maxepime 3/8 Vanc  3/8 Flagyl 3/8   LINES/TUBES:  Oral ET 3/8 >>> R IJ  3/8 >>>   CONSULTANTS:  Renal signed off 3/8   Scheduled Meds: . amLODipine  10 mg Oral Daily  . arformoterol  15 mcg Nebulization BID  . aspirin EC  81 mg Oral Daily  . budesonide (PULMICORT) nebulizer solution  0.5 mg  Nebulization BID  . Chlorhexidine Gluconate Cloth  6 each Topical Daily  . heparin  5,000 Units Subcutaneous Q8H  . insulin aspart  4 Units Subcutaneous TID WC  . ipratropium-albuterol  3 mL Nebulization BID  . levothyroxine  125 mcg Oral Daily  . mouth rinse  15 mL Mouth Rinse BID  . multivitamin-lutein  1 capsule Oral Daily  . simvastatin  20 mg Oral Daily  . sodium chloride flush  3 mL Intravenous Q12H  . timolol  1 drop Both Eyes BID  . vitamin B-12  1,000 mcg Oral Daily   Continuous Infusions: . sodium chloride    . ceFEPime (MAXIPIME) IV    . [START ON 02/20/2020] ceFEPime (MAXIPIME) IV    . metronidazole    . norepinephrine (LEVOPHED) Adult infusion 2 mcg/min (02/19/20 1459)  . sodium chloride     And  . sodium chloride    . vancomycin    . [START ON 02/20/2020] vancomycin     PRN Meds:.sodium chloride, acetaminophen **OR** acetaminophen, ALPRAZolam, benzonatate, bisacodyl, guaiFENesin-dextromethorphan, HYDROcodone-acetaminophen, insulin aspart, ipratropium-albuterol, ondansetron **OR** ondansetron (ZOFRAN) IV, polyethylene glycol, sodium chloride flush   SUBJECTIVE:  Sedated for cvl on my arrival  CONSTITUTIONAL: BP (!) 115/59 (BP Location: Left Arm)   Pulse (!) 106   Temp (!) 97.4 F (36.3 C) (  Axillary)   Resp 19   Ht 5\' 3"  (1.6 m)   Wt 68.2 kg   SpO2 100%   BMI 26.63 kg/m      Intake/Output Summary (Last 24 hours) at 02/19/2020 1522 Last data filed at 02/19/2020 1234 Gross per 24 hour  Intake 1030 ml  Output 3000 ml  Net -1970 ml       Vent Mode: PRVC FiO2 (%):  [100 %] 100 % Set Rate:  [16 bmp] 16 bmp Vt Set:  [420 mL] 420 mL PEEP:  [5 cmH20] 5 cmH20 Plateau Pressure:  [20 cmH20] 20 cmH20  PHYSICAL EXAM: General:  Elderly wf no resp to verbal p sedation for procedure Neuro:  sedated HEENT:  Oral et in place  Cardiovascular:  RRR no s3  Lungs:  Clear to a and p  Abdomen:  Soft/ benign Musculoskeletal:  No deform Skin:  No rash    pCXR 3/8   Clear/ et at [facility] > order to pull back x 3 cm  ekg 3/8 p event  No ischemic changes    RESOLVED PROBLEM LIST   ASSESSMENT AND PLAN   1)  Acute shock ? etoology / pt appears hypovolemic  by initial cvp  >>> volume expansion in progress / recheck cbc p 2 liters in  >>> repeat pct/ cortisol but no obvious source for sepsis nor significant fever > already addressed by [facility] >>> PE unlikely with initial cvp so low and pt was on hep sq   2) Acute hypoxemic respiratory failure ? Etiology  - not air trapping on present setting so absence of airflow obstruction during resp event worth noting as is no airflow obst on prior pfts >>> abgs pending        LABS  Glucose Recent Labs  Lab 02/18/20 2030 02/19/20 0235 02/19/20 0721 02/19/20 1111 02/19/20 1217 02/19/20 1310  GLUCAP 381* 266* 263* 428* 405* 378*    BMET Recent Labs  Lab 02/17/20 0615 02/18/20 0602 02/19/20 0450  NA 140 142 143  K 4.2 3.9 3.6  CL 107 106 104  CO2 24 27 31   BUN 94* 78* 60*  CREATININE 2.79* 2.26* 1.94*  GLUCOSE 79 214* 292*    Liver Enzymes Recent Labs  Lab 02/17/20 0615 02/18/20 0602 02/19/20 0450  ALBUMIN 3.1* 3.0* 2.7*    Electrolytes Recent Labs  Lab 02/17/20 0615 02/18/20 0602 02/19/20 0450  CALCIUM 7.8* 8.1* 8.0*  PHOS 6.0* 4.5 3.6    CBC Recent Labs  Lab 02/15/20 0412 02/18/20 0602  WBC 12.6* 18.3*  HGB 10.8* 9.6*  HCT 33.8* 29.9*  PLT 389 285    ABG No results for input(s): PHART, PCO2ART, PO2ART in the last 168 hours.  Coag's No results for input(s): APTT, INR in the last 168 hours.  Sepsis Markers No results for input(s): LATICACIDVEN, PROCALCITON, O2SATVEN in the last 168 hours.  Cardiac Enzymes No results for input(s): TROPONINI, PROBNP in the last 168 hours.  PAST MEDICAL HISTORY :   She  has a past medical history of Anemia, Anxiety, Arthritis, Blood transfusion without reported diagnosis, Bronchitis, CHF (congestive heart failure) ([place]), CKD  (chronic kidney disease) stage 3, GFR 30-59 ml/min (08/18/2018), Depression, Diabetes mellitus, Diverticulitis, Glaucoma, Hyperlipidemia, Hypertension, Hypothyroidism, Macular degeneration, Melanoma ([place]), Osteoarthritis, Peptic ulcer, Rheumatoid arthritis(714.0), S/P TAVR (transcatheter aortic valve replacement) (01/10/2019), and Severe aortic stenosis.  PAST SURGICAL HISTORY:  She  has a past surgical history that includes Foot surgery (1994); Abdominal hysterectomy (1988); Cholecystectomy (1974); Back surgery (07/2008);  Appendectomy; Colonoscopy (06/06/2007); Back surgery (04/2012); Melanoma excision (2010); Eye surgery; macular degen; Breast surgery; Lumbar spine surgery (08/04/2014); Colonoscopy (09/14/2012); Esophagogastroduodenoscopy (09/2012); Colonoscopy (N/A, 10/29/2016); Esophagogastroduodenoscopy (N/A, 10/29/2016); maloney dilation (N/A, 10/29/2016); Cataract extraction; Yag laser application; RIGHT/LEFT HEART CATH AND CORONARY ANGIOGRAPHY (N/A, 08/11/2018); Multiple extractions with alveoloplasty (N/A, 08/17/2018); Cardiac catheterization; Transcatheter aortic valve replacement, transfemoral (N/A, 01/10/2019); TEE without cardioversion (N/A, 01/10/2019); and Transcatheter aortic valve replacement, transfemoral (12/2018).  No Known Allergies  No current facility-administered medications on file prior to encounter.   Current Outpatient Medications on File Prior to Encounter  Medication Sig  . acetaminophen (TYLENOL) 325 MG tablet Take 1-2 tablets (325-650 mg total) by mouth every 4 (four) hours as needed for mild pain.  Marland Kitchen amLODipine (NORVASC) 10 MG tablet Take 1 tablet by mouth once daily  . aspirin EC 81 MG tablet Take 81 mg by mouth daily.   . beta carotene w/minerals (OCUVITE) tablet Take 1 tablet by mouth daily.  . fluticasone (FLONASE) 50 MCG/ACT nasal spray Place into both nostrils daily as needed for allergies or rhinitis.  Marland Kitchen gabapentin (NEURONTIN) 300 MG capsule Take 900 mg by mouth 3  (three) times daily.  Marland Kitchen glipiZIDE (GLUCOTROL) 10 MG tablet Take 1 tablet (10 mg total) by mouth 2 (two) times daily before a meal.  . hydrochlorothiazide (HYDRODIURIL) 25 MG tablet Take 25 mg by mouth daily.  Marland Kitchen JARDIANCE 10 MG TABS tablet Take 10 mg by mouth every morning.  . metFORMIN (GLUCOPHAGE) 500 MG tablet Take 1 tablet (500 mg total) by mouth 2 (two) times daily with a meal. No refills  . simvastatin (ZOCOR) 20 MG tablet Take 20 mg by mouth daily.  . timolol (TIMOPTIC) 0.5 % ophthalmic solution Place 1 drop into both eyes 2 (two) times daily.   . VENTOLIN HFA 108 (90 Base) MCG/ACT inhaler Inhale 2 puffs into the lungs every 4 (four) hours as needed for wheezing or shortness of breath.   . vitamin B-12 (CYANOCOBALAMIN) 1000 MCG tablet Take 1,000 mcg by mouth daily.    FAMILY HISTORY:   Her family history includes Bipolar disorder in her daughter; Breast cancer in her sister; Colon cancer in her brother; Diabetes in her brother, mother, sister, and sister; Glaucoma in her mother; Heart attack in her brother; Heart failure in her father and mother; Hypertension in her daughter, mother, sister, sister, sister, and another family member; Other in her sister and another family member. There is no history of Anesthesia problems, Amblyopia, Blindness, Cataracts, Macular degeneration, Retinal detachment, Strabismus, or Retinitis pigmentosa.  SOCIAL HISTORY:  She  reports that she has never smoked. She has never used smokeless tobacco. She reports that she does not drink alcohol or use drugs.     The patient is critically ill with multiple organ systems failure and requires high complexity decision making for assessment and support, frequent evaluation and titration of therapies, application of advanced monitoring technologies and extensive interpretation of multiple databases. Critical Care Time devoted to patient care services described in this note is 60 minutes.    Christinia Gully, MD Pulmonary  and [place] [phone] After 5:30 PM or weekends, use [facility] [phone]

## 2020-02-19 NOTE — Progress Notes (Signed)
Arrived to [facility] 2215  44 yoF who originally admitted at [facility] on 2/26 for weakness, falls, SOB being treated for CAP and AKI who was about to be discharged to SNF when found pale, agonal, and hypotensive in her room requiring intubation and tx to ICU.  A central line was placed and started on vasopressor support.  Found to have CVP of zero.   Labs significant for Hgb drop 9.6 (3/7) -> 4.5, sCr 1.94- > 2.12, and K 5.6.  Also noted to have elevated LFTs, lactic 10.3- >7.1, PCT 0.16, Platelets 130,  INR 2.4  Went for [facility], CT chest, and CT abd/ pelvis >>  CT abd/ pelvis >>  1. Interval development of a large left retroperitoneal hematoma extending into the left iliopsoas and the pelvis. 2. Mild circumferential rectal wall thickening, potentially representing a mild uncomplicated nonspecific proctitis. 3. Redemonstration of horseshoe kidney with interval development of moderate perinephric inflammatory changes, likely medical renal disease or acute urinary tract infection. 4. Mild sigmoid diverticulosis with adjacent mesenteric stranding likely secondary to the adjacent retroperitoneal hematoma rather than an acute diverticulitis. No free intraperitoneal air or abscess. 5. Mild abdominal wall anasarca and clustered foci of air, likely secondary to therapeutic injections. 6. Small amount of dense material in the dependent gastric fundus, probably dense ingested contents. Appropriate enteric tube placement.  CT chest  >> 1. Bilateral bronchitis with mucous plugging in the bilateral upper lobe airways and tracheobronchial aspiration. Unenhanced CT chest recommended in 3 months to document resolution. 2. Small left-sided pleural effusion. 3. 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 4. Endotracheal tube tip 1.8 cm above the carina; a 1.7 cm retraction recommended. 5. 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 6. Stable mild cardiomegaly with four-vessel  coronary calcification and remote aortic valvular replacement. 7. Aortic Atherosclerosis   CTH >> No evidence of acute intracranial injury. Stable chronic findings detailed above.   Patient to be transferred to [facility] for IR capabilities if needed.  Ordered 4 units of PRBC emergent released/ rapid infused, 4 units of FFP, and 1 pack platelets, calcium gluconate 1 gm, and vitamin K 10 mg at [facility].  Arrived on levophed at 25 mcg/min and precedex for sedation. Thus far only 1 unit of PRBC and 1 FFP transfused at [facility].   Ventilating and oxygenating well, no issues, FiO2 0.4 and PEEP 5 Peaked [place] noted on monitor, normal QRS    Hemorrhagic shock related to acute left retroperitoneal bleed  P:  CXR now - stable ETT/ R IJ CVL, stable left basilar atelectasis  Full MV support, PAD protocol with precedex/ prn fentanyl  Added PPI  D/c ASA Stat H/H and BMET to monitor hyperkalemia/ renal function Strict I/Os To be transfused remainder of 3 units PRBC, 3 FFP and pack of platelets STAT/ emergency release if necessary.  Should be able to wean pressor requirement with adequate resuscitation.  If not, will need to contact IR.  Insert art line for close BP monitoring  Continue CVP monitoring  CT abd/ pelvis with contrast ordered at [facility], will hold for now as this will not change our plan of care for now.      Addendum 2340- Hgb now at 6.8 after 1 unit (was 4.5), therefore appropriate response with transfusion.  Will transfuse 2 units PRBC, 2 FFP, and 1 pack platelets and then will reass for further transfusion/ plan of care.   Additonial CCT: 30 mins   Kennieth Rad, MSN, AGACNP-BC [facility] 02/19/2020,  11:18 PM

## 2020-02-19 NOTE — Care Management Important Message (Signed)
Important Message  Patient Details  Name: [name] [name] MRN: [id number] Date of Birth: Apr 16, 1946   Medicare Important Message Given:  Yes     Tommy Medal 02/19/2020, 12:08 PM

## 2020-02-19 NOTE — Discharge Instructions (Signed)
IMPORTANT INFORMATION: PAY CLOSE ATTENTION  ? ?PHYSICIAN DISCHARGE INSTRUCTIONS ? ?Follow with Primary care provider  Fusco, Lawrence, MD  and other consultants as instructed by your Hospitalist Physician ? ?SEEK MEDICAL CARE OR RETURN TO EMERGENCY ROOM IF SYMPTOMS COME BACK, WORSEN OR NEW PROBLEM DEVELOPS  ? ?Please note: ?You were cared for by a hospitalist during your hospital stay. Every effort will be made to forward records to your primary care provider.  You can request that your primary care provider send for your hospital records if they have not received them.  Once you are discharged, your primary care physician will handle any further medical issues. Please note that NO REFILLS for any discharge medications will be authorized once you are discharged, as it is imperative that you return to your primary care physician (or establish a relationship with a primary care physician if you do not have one) for your post hospital discharge needs so that they can reassess your need for medications and monitor your lab values. ? ?Please get a complete blood count and chemistry panel checked by your Primary MD at your next visit, and again as instructed by your Primary MD. ? ?Get Medicines reviewed and adjusted: ?Please take all your medications with you for your next visit with your Primary MD ? ?Laboratory/radiological data: ?Please request your Primary MD to go over all hospital tests and procedure/radiological results at the follow up, please ask your primary care provider to get all Hospital records sent to his/her office. ? ?In some cases, they will be blood work, cultures and biopsy results pending at the time of your discharge. Please request that your primary care provider follow up on these results. ? ?If you are diabetic, please bring your blood sugar readings with you to your follow up appointment with primary care.   ? ?Please call and make your follow up appointments as soon as possible.   ? ?Also Note  the following: ?If you experience worsening of your admission symptoms, develop shortness of breath, life threatening emergency, suicidal or homicidal thoughts you must seek medical attention immediately by calling 911 or calling your MD immediately  if symptoms less severe. ? ?You must read complete instructions/literature along with all the possible adverse reactions/side effects for all the Medicines you take and that have been prescribed to you. Take any new Medicines after you have completely understood and accpet all the possible adverse reactions/side effects.  ? ?Do not drive when taking Pain medications or sleeping medications (Benzodiazepines) ? ?Do not take more than prescribed Pain, Sleep and Anxiety Medications. It is not advisable to combine anxiety,sleep and pain medications without talking with your primary care practitioner ? ?Special Instructions: If you have smoked or chewed Tobacco  in the last 2 yrs please stop smoking, stop any regular Alcohol  and or any Recreational drug use. ? ?Wear Seat belts while driving.  Do not drive if taking any narcotic, mind altering or controlled substances or recreational drugs or alcohol.  ? ? ? ? ? ?

## 2020-02-19 NOTE — Progress Notes (Signed)
Received patient via [facility]. Placed on mechanical ventilation. Settings from previous facility. 7.5 ETT secured 21cm @lip .

## 2020-02-19 NOTE — Procedures (Signed)
Procedure Note  02/19/20    Preoperative Diagnosis: Hemodynamic instability, respiratory distress, acute kidney injury after rapid response called and emergent intubation    Postoperative Diagnosis: Same   Procedure(s) Performed: Central Line placement, right internal jugular    Surgeon: Lanell Matar. Constance Haw, MD   Assistants: None   Anesthesia: 1% lidocaine    Complications: None    Indications: Ms. [name] [name] is a 74 y.o. with a history of COPD, CHF, AS s/p TAVR, recent hospitalization and was suppose to be going home and developed altered mental status and was found unresponsive. She was emergently intubated. She is now hemodynamically unstable and needs access for further resuscitation and management.  I discussed the risk and benefits of placement of the central line with her daughter [name], including but not limited to bleeding, infection, and risk of pneumothorax. She has given verbal phone consent for the procedure.    Procedure: The patient placed supine. The right chest and neck was prepped and draped in the usual sterile fashion.  Wearing full gown and gloves, I performed the procedure.  One percent lidocaine was used for local anesthesia. An ultrasound was utilized to assess the jugular vein.  The needle with syringe was advanced into the vein with dark venous return, and a wire was placed using the Seldinger technique without difficulty.  Ectopia was not noted as she had prior PVCs  The skin was knicked and a dilator was placed, and the three lumen catheter was placed over the wire with continued control of the wire.  There was good draw back of blood from all three lumens and each flushed easily with saline.  The catheter was secured in 4 points with 2-0 silk and a biopatch and dressing was placed.     The patient tolerated the procedure well, and the CXR was ordered to confirm position of the central line.   Curlene Labrum, MD [facility] [address] [place], [place] [id number] [phone] (office)

## 2020-02-19 NOTE — ED Provider Notes (Signed)
ED MD RAPID RESPONSE NOTE  Called to bedside for unresponsiveness.  Pt currently in the hospital for acute resp failure with hypoxia.  Pt had been getting ready for discharge from hospital today.  Pt found pt unresponsive.  Pt minimally responsive, unable to provide any history. Physical Exam  BP (!) 115/59 (BP Location: Left Arm)   Pulse (!) 106   Temp (!) 97.5 F (36.4 C) (Oral)   Resp 19   Ht 1.448 m (4\' 9" )   Wt 69.7 kg   SpO2 100%   BMI 33.25 kg/m   Physical Exam Constitutional:      Appearance: She is ill-appearing.     Comments: Unresponsive   HENT:     Head: Normocephalic and atraumatic.     Mouth/Throat:     Mouth: Mucous membranes are moist.  Cardiovascular:     Rate and Rhythm: Normal rate.  Pulmonary:     Comments: Labored breathing Musculoskeletal:     Cervical back: No rigidity.  Skin:    Coloration: Skin is pale.  Neurological:     Comments: Unresponsive, some purposeful movements towards painful stimuli     ED Course/Procedures    Dr Wynetta Emery, attending MD is at the bedside.  We agreed to proceed with intubation. Procedure Name: Intubation Date/Time: 02/19/2020 1:45 PM Performed by: Dorie Rank, MD Pre-anesthesia Checklist: Patient identified, Patient being monitored, Emergency Drugs available, Timeout performed and Suction available Oxygen Delivery Method: Non-rebreather mask Preoxygenation: Pre-oxygenation with 100% oxygen Induction Type: Rapid sequence Ventilation: Mask ventilation without difficulty Laryngoscope Size: Glidescope and 3 Grade View: Grade I Tube size: 7.5 mm Number of attempts: 2 Placement Confirmation: ETT inserted through vocal cords under direct vision,  CO2 detector and Breath sounds checked- equal and bilateral Dental Injury: Teeth and Oropharynx as per pre-operative assessment  Comments: Repositioned after first attempt, cords visualize but was unable to direct tube appropriately.  2nd attempt without  difficulty.       MDM  Pt with altered mental status.  Appeared to have some difficulty breathing.  Not responding appropriately.  Intubated successfully.  Pt will need ET tube placement assessment via xray.  Dr Wynetta Emery will continue care of the patient.        Dorie Rank, MD 02/19/20 [id number]

## 2020-02-19 NOTE — TOC Transition Note (Signed)
Transition of Care [facility]) - CM/SW Discharge Note   Patient Details  Name: [name] [name] MRN: [id number] Date of Birth: 02-24-1946  Transition of Care [facility]) CM/SW Contact:  Ihor Gully, LCSW Phone Number: 02/19/2020, 12:36 PM   Clinical Narrative:    Discharge clinicals sent to facility.  RN to call report. Nursing secretary to call EMS when ready.  TOC signing off.   Final next level of care: [place] Barriers to Discharge: No Barriers Identified   Patient Goals and CMS Choice Patient states their goals for this hospitalization and ongoing recovery are:: to go to SNF for rehab then back home. CMS Medicare.gov Compare Post Acute Care list provided to:: Patient Choice offered to / list presented to : Patient  Discharge Placement              Patient chooses bed at: Other - please specify in the comment section below:([facility]) Patient to be transferred to facility by: [place] Name of family member notified: Calls to daughters, [name] and Mardene Celeste, went unaswered unable to leave message. HIPPA complaint message left for sister listed on chart, [name]. Patient and family notified of of transfer: 02/19/20  Discharge Plan and Services                                     Social Determinants of Health (SDOH) Interventions     Readmission Risk Interventions Readmission Risk Prevention Plan 02/12/2020  Transportation Screening Complete  PCP or Specialist Appt within 3-5 Days Not Complete  HRI or [place] Complete  Social Work Consult for [place] Planning/Counseling Complete  Palliative Care Screening Not Complete  Medication Review Press photographer) Complete  Some recent data might be hidden

## 2020-02-19 NOTE — Progress Notes (Signed)
02/19/2020 6:25 PM  I updated daughter about current condition and care plan.  She wants to continue full scope care at this time.  I spoke with Dr. Tamala Julian of critical care services at [facility]. He recommended massive transfusion protocol and transfer to [facility] when ICU bed available.   PRBC transfusing now.  FFP ordered.  Vit K, Calcium Chloride, platelets ordered.  I spoke with Dr. Constance Haw, no indication for acute surgical intervention at this time but patient will need to be at a center with IR support in case that service is needed.   I remained at bedside with attending RNs.  Continue pressor support.   Murvin Natal MD  How to contact the [facility] Attending or Consulting provider [place] or covering provider during after hours [place], for this patient?  1. Check the care team in [facility] and look for a) attending/consulting TRH provider listed and b) the [facility] team listed 2. Log into [email or web address] and use [place]'s universal password to access. If you do not have the password, please contact the hospital operator. 3. Locate the [facility] provider you are looking for under [facility] and page to a number that you can be directly reached. 4. If you still have difficulty reaching the provider, please page the [facility] (Director on Call) for the Hospitalists listed on amion for assistance.

## 2020-02-19 NOTE — Progress Notes (Signed)
CRITICAL VALUE ALERT  Critical Value:  Lactic Acid 10.3/Hgb 4.5  Date & Time Notied:  02/19/2020 @ 1611  Provider Notified: Wynetta Emery, MD  Orders Received/Actions taken: awaiting new orders  Celestia Khat, RN

## 2020-02-19 NOTE — Progress Notes (Signed)
[facility] just transported patient via stretcher. Belongings from security sent with patient in belonging bad and [facility] made aware of belongings.   Report given to [facility]

## 2020-02-19 NOTE — Progress Notes (Signed)
Report called to nurse Dewitt Hoes at [facility]. Awaiting EMS transport.

## 2020-02-20 ENCOUNTER — Inpatient Hospital Stay (HOSPITAL_COMMUNITY): Payer: Medicare HMO

## 2020-02-20 DIAGNOSIS — J42 Unspecified chronic bronchitis: Secondary | ICD-10-CM

## 2020-02-20 DIAGNOSIS — J209 Acute bronchitis, unspecified: Secondary | ICD-10-CM

## 2020-02-20 LAB — BPAM FFP
Blood Product Expiration Date: 202103132359
Blood Product Expiration Date: 202103132359
Blood Product Expiration Date: 202103122359
Blood Product Expiration Date: 202103122359
Blood Product Expiration Date: 202103122359
ISSUE DATE / TIME: 202103082012
ISSUE DATE / TIME: 202103082318
ISSUE DATE / TIME: 202103082318
ISSUE DATE / TIME: 202103082318
Unit Type and Rh: 5100
Unit Type and Rh: 5100
Unit Type and Rh: 6200
Unit Type and Rh: 6200
Unit Type and Rh: 6200

## 2020-02-20 LAB — BASIC METABOLIC PANEL
Anion gap: 21 — ABNORMAL HIGH (ref 5–15)
BUN: 62 mg/dL — ABNORMAL HIGH (ref 8–23)
CO2: 14 mmol/L — ABNORMAL LOW (ref 22–32)
Calcium: 7.2 mg/dL — ABNORMAL LOW (ref 8.9–10.3)
Chloride: 111 mmol/L (ref 98–111)
Creatinine, Ser: 3.12 mg/dL — ABNORMAL HIGH (ref 0.44–1.00)
GFR calc Af Amer: 16 mL/min — ABNORMAL LOW (ref >60)
GFR calc non Af Amer: 14 mL/min — ABNORMAL LOW (ref >60)
Glucose, Bld: 67 mg/dL — ABNORMAL LOW (ref 70–99)
Potassium: 5 mmol/L (ref 3.5–5.1)
Sodium: 146 mmol/L — ABNORMAL HIGH (ref 135–145)

## 2020-02-20 LAB — COMPREHENSIVE METABOLIC PANEL
ALT: 9338 U/L — ABNORMAL HIGH (ref 0–44)
ALT: 8321 U/L — ABNORMAL HIGH (ref 0–44)
AST: >10000 U/L — ABNORMAL HIGH (ref 15–41)
AST: >10000 U/L — ABNORMAL HIGH (ref 15–41)
Albumin: 2.5 g/dL — ABNORMAL LOW (ref 3.5–5.0)
Albumin: 2.4 g/dL — ABNORMAL LOW (ref 3.5–5.0)
Alkaline Phosphatase: 113 U/L (ref 38–126)
Alkaline Phosphatase: 115 U/L (ref 38–126)
Anion gap: 17 — ABNORMAL HIGH (ref 5–15)
Anion gap: 15 (ref 5–15)
BUN: 53 mg/dL — ABNORMAL HIGH (ref 8–23)
BUN: 58 mg/dL — ABNORMAL HIGH (ref 8–23)
CO2: 18 mmol/L — ABNORMAL LOW (ref 22–32)
CO2: 18 mmol/L — ABNORMAL LOW (ref 22–32)
Calcium: 7.7 mg/dL — ABNORMAL LOW (ref 8.9–10.3)
Calcium: 7.4 mg/dL — ABNORMAL LOW (ref 8.9–10.3)
Chloride: 110 mmol/L (ref 98–111)
Chloride: 112 mmol/L — ABNORMAL HIGH (ref 98–111)
Creatinine, Ser: 2.5 mg/dL — ABNORMAL HIGH (ref 0.44–1.00)
Creatinine, Ser: 2.93 mg/dL — ABNORMAL HIGH (ref 0.44–1.00)
GFR calc Af Amer: 21 mL/min — ABNORMAL LOW (ref >60)
GFR calc Af Amer: 18 mL/min — ABNORMAL LOW (ref >60)
GFR calc non Af Amer: 18 mL/min — ABNORMAL LOW (ref >60)
GFR calc non Af Amer: 15 mL/min — ABNORMAL LOW (ref >60)
Glucose, Bld: 134 mg/dL — ABNORMAL HIGH (ref 70–99)
Glucose, Bld: 117 mg/dL — ABNORMAL HIGH (ref 70–99)
Potassium: 5 mmol/L (ref 3.5–5.1)
Potassium: 4.7 mmol/L (ref 3.5–5.1)
Sodium: 145 mmol/L (ref 135–145)
Sodium: 145 mmol/L (ref 135–145)
Total Bilirubin: 1.6 mg/dL — ABNORMAL HIGH (ref 0.3–1.2)
Total Bilirubin: 1.8 mg/dL — ABNORMAL HIGH (ref 0.3–1.2)
Total Protein: 4.9 g/dL — ABNORMAL LOW (ref 6.5–8.1)
Total Protein: 4.6 g/dL — ABNORMAL LOW (ref 6.5–8.1)

## 2020-02-20 LAB — BPAM RBC
Blood Product Expiration Date: 202104142359
Blood Product Expiration Date: 202104142359
Blood Product Expiration Date: 202104142359
Blood Product Expiration Date: 202104152359
ISSUE DATE / TIME: 202103081819
Unit Type and Rh: 5100
Unit Type and Rh: 5100
Unit Type and Rh: 5100
Unit Type and Rh: 5100

## 2020-02-20 LAB — PROTIME-INR
INR: 2.2 — ABNORMAL HIGH (ref 0.8–1.2)
INR: 2.3 — ABNORMAL HIGH (ref 0.8–1.2)
Prothrombin Time: 24.5 seconds — ABNORMAL HIGH (ref 11.4–15.2)
Prothrombin Time: 25.6 seconds — ABNORMAL HIGH (ref 11.4–15.2)

## 2020-02-20 LAB — PREPARE FRESH FROZEN PLASMA
Unit division: 0
Unit division: 0
Unit division: 0
Unit division: 0
Unit division: 0

## 2020-02-20 LAB — AMMONIA: Ammonia: 65 umol/L — ABNORMAL HIGH (ref 9–35)

## 2020-02-20 LAB — BPAM PLATELET PHERESIS
Blood Product Expiration Date: 202103112359
Blood Product Expiration Date: 202103102359
ISSUE DATE / TIME: 202103082316
Unit Type and Rh: 6200
Unit Type and Rh: 5100

## 2020-02-20 LAB — GLUCOSE, CAPILLARY
Glucose-Capillary: 109 mg/dL — ABNORMAL HIGH (ref 70–99)
Glucose-Capillary: 114 mg/dL — ABNORMAL HIGH (ref 70–99)
Glucose-Capillary: 123 mg/dL — ABNORMAL HIGH (ref 70–99)
Glucose-Capillary: 13 mg/dL — CL (ref 70–99)
Glucose-Capillary: 82 mg/dL (ref 70–99)
Glucose-Capillary: 89 mg/dL (ref 70–99)
Glucose-Capillary: 90 mg/dL (ref 70–99)
Glucose-Capillary: 99 mg/dL (ref 70–99)

## 2020-02-20 LAB — TYPE AND SCREEN
ABO/RH(D): O POS
Antibody Screen: NEGATIVE
Unit division: 0
Unit division: 0
Unit division: 0
Unit division: 0

## 2020-02-20 LAB — LACTIC ACID, PLASMA
Lactic Acid, Venous: 5.1 mmol/L (ref 0.5–1.9)
Lactic Acid, Venous: 5.1 mmol/L (ref 0.5–1.9)

## 2020-02-20 LAB — CBC
HCT: 27.6 % — ABNORMAL LOW (ref 36.0–46.0)
Hemoglobin: 9.3 g/dL — ABNORMAL LOW (ref 12.0–15.0)
MCH: 29.6 pg (ref 26.0–34.0)
MCHC: 33.7 g/dL (ref 30.0–36.0)
MCV: 87.9 fL (ref 80.0–100.0)
Platelets: 126 10*3/uL — ABNORMAL LOW (ref 150–400)
RBC: 3.14 MIL/uL — ABNORMAL LOW (ref 3.87–5.11)
RDW: 13.5 % (ref 11.5–15.5)
WBC: 28.2 10*3/uL — ABNORMAL HIGH (ref 4.0–10.5)
nRBC: 0.5 % — ABNORMAL HIGH (ref 0.0–0.2)

## 2020-02-20 LAB — TSH: TSH: 9.124 u[IU]/mL — ABNORMAL HIGH (ref 0.350–4.500)

## 2020-02-20 LAB — PREPARE PLATELET PHERESIS
Unit division: 0
Unit division: 0

## 2020-02-20 LAB — HEMOGLOBIN AND HEMATOCRIT, BLOOD
HCT: 22.7 % — ABNORMAL LOW (ref 36.0–46.0)
HCT: 21.7 % — ABNORMAL LOW (ref 36.0–46.0)
HCT: 18.2 % — ABNORMAL LOW (ref 36.0–46.0)
Hemoglobin: 7.6 g/dL — ABNORMAL LOW (ref 12.0–15.0)
Hemoglobin: 7.4 g/dL — ABNORMAL LOW (ref 12.0–15.0)
Hemoglobin: 6.1 g/dL — CL (ref 12.0–15.0)

## 2020-02-20 LAB — PREPARE RBC (CROSSMATCH)

## 2020-02-20 LAB — MRSA PCR SCREENING: MRSA by PCR: NEGATIVE

## 2020-02-20 LAB — PROCALCITONIN: Procalcitonin: 4.1 ng/mL

## 2020-02-20 LAB — TROPONIN I (HIGH SENSITIVITY): Troponin I (High Sensitivity): 380 ng/L (ref <18)

## 2020-02-20 MED ORDER — SODIUM CHLORIDE 0.9 % IV BOLUS
1000.0000 mL | Freq: Once | INTRAVENOUS | Status: AC
  Administered 2020-02-20: 1000 mL via INTRAVENOUS

## 2020-02-20 MED ORDER — SODIUM CHLORIDE 0.9% IV SOLUTION: Freq: Once | INTRAVENOUS | Status: AC

## 2020-02-20 MED ORDER — PROSIGHT PO TABS
1.0000 | ORAL_TABLET | Freq: Every day | ORAL | Status: DC
  Administered 2020-02-20: 1 via ORAL
  Filled 2020-02-20 (×3): qty 1

## 2020-02-20 MED ORDER — INSULIN ASPART 100 UNIT/ML ~~LOC~~ SOLN: 0.0000 [IU] | SUBCUTANEOUS | Status: DC

## 2020-02-20 MED ORDER — SODIUM CHLORIDE 0.9 % IV SOLN
2.0000 g | INTRAVENOUS | Status: DC
  Filled 2020-02-20: qty 2

## 2020-02-20 MED ORDER — SODIUM CHLORIDE 0.9% IV SOLUTION: Freq: Once | INTRAVENOUS | Status: DC

## 2020-02-20 MED ORDER — SODIUM CHLORIDE 0.9 % IV SOLN
1.0000 g | INTRAVENOUS | Status: DC
  Administered 2020-02-20 – 2020-02-21 (×2): 1 g via INTRAVENOUS
  Filled 2020-02-20 (×3): qty 1

## 2020-02-20 MED ORDER — HEPARIN SODIUM (PORCINE) 1000 UNIT/ML IJ SOLN
3000.0000 [IU] | Freq: Once | INTRAMUSCULAR | Status: AC
  Administered 2020-02-20: 2800 [IU] via INTRAVENOUS
  Filled 2020-02-20: qty 3

## 2020-02-20 MED ORDER — VITAMIN K1 10 MG/ML IJ SOLN
10.0000 mg | Freq: Once | INTRAVENOUS | Status: AC
  Administered 2020-02-20: 10 mg via INTRAVENOUS
  Filled 2020-02-20: qty 1

## 2020-02-20 MED ORDER — DEXTROSE 50 % IV SOLN
25.0000 g | INTRAVENOUS | Status: AC
  Administered 2020-02-20: 25 g via INTRAVENOUS

## 2020-02-20 MED ORDER — DEXTROSE 50 % IV SOLN
INTRAVENOUS | Status: AC
  Filled 2020-02-20: qty 50

## 2020-02-20 NOTE — Progress Notes (Signed)
Hold on MRI for now per CCM MRI made aware.

## 2020-02-20 NOTE — Progress Notes (Signed)
  Unable to decrease vasopressor requirement below 10 mcg/min, currently at 13 Last H/H 9.3 after 3 units PRBC.    Oliguria, 60 ml since 2300, sCr 2.4 -> 2.5 Increasing AGMA Although lactic is still down trending.  CVP 6 s/p NS bolus by EMD- since received 500 ml and will stop as I plan to transfuse 2 more FFP given INR remains 2.2 LFTs still pending (had normal LFTS in February)  Ammonia 65, TSH 9.124 Has only required one bolus of fentanyl and remains on precedex at 1 mcg/kg/hr  MRI brain ordered, will go hopefully soon this morning  Bladder pressures 21 ->23 (could be falsely elevated given pressure from RP hematoma on bladder)  - recent TTE 02/10/20 with EF 55-60%, G1DD, and normal functioning AV s/p recent TAVR,   P:  2 units FFP now for INR goal < 1.5 F/u H/H at 0800, if down would consider re-imaging abd/ pelvis with contrast MRI brain pending    Kennieth Rad, MSN, AGACNP-BC [facility] 02/20/2020, 6:37 AM

## 2020-02-20 NOTE — Progress Notes (Signed)
eLink Physician-Brief Progress Note Patient Name: [name] [name] DOB: 10/29/1946 MRN: [id number]   Date of Service  02/20/2020  HPI/Events of Note  Lactic Acid = 5.1 and CVP = 6.   eICU Interventions  Will bolus with 0.9 NaCl 1 liter IV over 1 hour now.      Intervention Category Major Interventions: Acid-Base disturbance - evaluation and management;Hypovolemia - evaluation and treatment with fluids  Sommer,Steven Eugene 02/20/2020, 4:40 AM

## 2020-02-20 NOTE — Progress Notes (Signed)
Admit: 01/16/2020 LOS: 11  62F recent admission at [facility] for acute exacerbation of COPD with CAP; AKI likely related to ATN which was resolving; nearing discharge she had an acute decompensation, hypotensive, agonal, and found to have hemorrhagic shock with large left retroperitoneal bleed requiring massive transfusion, intubation, vasopressors, and transfer to [facility].  Subjective:  . Events of the past 24 hours noted, case discussed with critical care . Just prior to discharge patient had her acute decompensation, found to have hemorrhagic shock with a large left retroperitoneal hemorrhage. . Currently on norepinephrine at 10 mcg/min . Her AKI had nearly resolved, UOP was robust, and in the past 24 hours creatinine has risen to 2.5, she appears to be an uric at the current time . She has massive transaminitis; shock liver . Encephalopathic, CT of the head is negative, for MRI when clinically stable . CT of abdomen on 3/8 with some moderate perinephric inflammatory changes; no hydronephrosis present  03/08 0701 - 03/09 0700 In: 10504.4 [P.O.:480; I.V.:1214; Blood:2446.8; IV Piggyback:6363.7] Out: 980 [Urine:980]  Filed Weights   02/18/20 0500 02/19/20 0606 02/19/20 1354  Weight: 75.1 kg 69.7 kg 68.2 kg    Scheduled Meds: . sodium chloride   Intravenous Once  . arformoterol  15 mcg Nebulization BID  . budesonide (PULMICORT) nebulizer solution  0.5 mg Nebulization BID  . chlorhexidine gluconate (MEDLINE KIT)  15 mL Mouth Rinse BID  . Chlorhexidine Gluconate Cloth  6 each Topical Daily  . insulin aspart  0-9 Units Subcutaneous Q4H  . levothyroxine  125 mcg Per Tube Daily  . mouth rinse  15 mL Mouth Rinse BID  . mouth rinse  15 mL Mouth Rinse 10 times per day  . multivitamin  1 tablet Oral Daily  . pantoprazole (PROTONIX) IV  40 mg Intravenous Q24H  . sodium chloride flush  10-40 mL Intracatheter Q12H  . sodium chloride flush  3 mL Intravenous Q12H  . timolol  1  drop Both Eyes BID  . vitamin B-12  1,000 mcg Per Tube Daily   Continuous Infusions: . sodium chloride 250 mL (02/20/20 1203)  . ceFEPime (MAXIPIME) IV    . dexmedetomidine (PRECEDEX) IV infusion Stopped (02/20/20 0953)  . norepinephrine (LEVOPHED) Adult infusion 10 mcg/min (02/20/20 1243)  . phytonadione (VITAMIN K) IV 10 mg (02/20/20 1157)   PRN Meds:.sodium chloride, bisacodyl, docusate, fentaNYL (SUBLIMAZE) injection, fentaNYL (SUBLIMAZE) injection, ipratropium-albuterol, ondansetron **OR** ondansetron (ZOFRAN) IV, polyethylene glycol, sodium chloride flush, sodium chloride flush  Current Labs: reviewed    Physical Exam:  Blood pressure (!) 95/51, pulse 60, temperature (!) 96.4 F (35.8 C), resp. rate (!) 22, height '5\' 3"'$  (1.6 m), weight 68.2 kg, SpO2 100 %. Intubated, sedated, not responsive Regular, normal S1 and S2 Coarse breath sounds bilaterally Abdomen distended, no grimacing to palpation, hypoactive bowel sounds No rashes or lesions NCAT  A 1. Recent severe AKI which is resolving, and now with recurrent AKI, likely developing into anuric state; almost certainly this is ischemic ATN; baseline CKD 3 2. Hemorrhagic shock as a consequence of large left retroperitoneal hemorrhage on 02/19/2020, remains on norepinephrine 3. ABLA from #2 4. COPD recent acute exacerbation 5. Shock liver with coagulopathy 6. Mild metabolic acidosis 7. VDRF 8. Chronic diastolic heart failure  P . I suspect that her AKI will worsen and UOP will further follow off . No immediate indication to initiate CRRT, but likely to develop in the next 24 to 48 hours; discussed with CCM and would agree  with placing HD catheter at the current time . Would continue to trend labs throughout the course of today and tomorrow, at least every 8 hours   Pearson Grippe MD 02/20/2020, 12:50 PM  Recent Labs  Lab 02/17/20 0615 02/17/20 0615 02/18/20 0602 02/18/20 0602 02/19/20 0450 02/19/20 1345 02/19/20 2241  02/20/20 0255 02/20/20 1051  NA 140   < > 142   < > 143   < > 144 145 145  K 4.2   < > 3.9   < > 3.6   < > 5.3* 5.0 4.7  CL 107   < > 106   < > 104   < > 113* 110 112*  CO2 24   < > 27   < > 31   < > 16* 18* 18*  GLUCOSE 79   < > 214*   < > 292*   < > 95 134* 117*  BUN 94*   < > 78*   < > 60*   < > 54* 53* 58*  CREATININE 2.79*   < > 2.26*   < > 1.94*   < > 2.40* 2.50* 2.93*  CALCIUM 7.8*   < > 8.1*   < > 8.0*   < > 7.4* 7.7* 7.4*  PHOS 6.0*  --  4.5  --  3.6  --   --   --   --    < > = values in this interval not displayed.   Recent Labs  Lab 02/18/20 0602 02/18/20 0602 02/19/20 1345 02/19/20 2241 02/20/20 0255 02/20/20 0810 02/20/20 1051  WBC 18.3*  --  11.0*  --  28.2*  --   --   NEUTROABS  --   --  8.3*  --   --   --   --   HGB 9.6*   < > 4.5*   < > 9.3* 7.6* 7.4*  HCT 29.9*   < > 14.7*   < > 27.6* 22.7* 21.7*  MCV 87.4  --  94.2  --  87.9  --   --   PLT 285  --  130*  --  126*  --   --    < > = values in this interval not displayed.

## 2020-02-20 NOTE — Progress Notes (Signed)
Initial Nutrition Assessment  DOCUMENTATION CODES:   Not applicable  INTERVENTION:   Tube feeding:  Vital High Protein @ 50 ml/hr via OGT  Provides: 1200 kcals, 105 grams protein, 1003 ml free water.   NUTRITION DIAGNOSIS:   Increased nutrient needs related to acute illness as evidenced by estimated needs.  GOAL:   Patient will meet greater than or equal to 90% of their needs  MONITOR:   Diet advancement, Vent status, Skin, Weight trends, TF tolerance, Labs, I & O's  REASON FOR ASSESSMENT:   Ventilator    ASSESSMENT:   Patient with PMH significant for HTN, HLD, DM, CVA, chronic bronchitis, CKD III, RA, and severe aortic stenosis s/p TAVR. Presented to [facility] for CAP and AECOPD. Was scheduled to d/c on 3/8 but was found agonal prior to transport.   3/8- intubated, transferred to [facility]  Found to have large L retroperitoneal bleed, massive transfusion protocol initiated. Requiring pressors. Plan MRI to rule out anoxic injury once stabilized. Hold off on feeding tube per CCM.   Admission weight: 67.6 kg  Current weight: 68.3 kg   Patient is currently intubated on ventilator support MV: 10 L/min Temp (24hrs), Avg:97 F (36.1 C), Min:95.4 F (35.2 C), Max:99.7 F (37.6 C)   I/O: +4,148 ml since admit  UOP: 980 ml x 24 hrs   Drips: precedex, levophed  Medications: SS novolog, Vit B12 Labs: Cr 2.93- trending up LFTs elevated CBG 89-114  NUTRITION - FOCUSED PHYSICAL EXAM:    Most Recent Value  Orbital Region  No depletion  Upper Arm Region  No depletion  Thoracic and Lumbar Region  Unable to assess  Buccal Region  No depletion  Temple Region  No depletion  Clavicle Bone Region  No depletion  Clavicle and Acromion Bone Region  No depletion  Scapular Bone Region  Unable to assess  Dorsal Hand  No depletion  Patellar Region  No depletion  Anterior Thigh Region  No depletion  Posterior Calf Region  No depletion  Edema (RD Assessment)  Mild  Hair  Reviewed  Eyes   Unable to assess  Mouth  Unable to assess  Skin  Reviewed  Nails  Reviewed      Diet Order:   Diet Order            Diet NPO time specified  Diet effective now              EDUCATION NEEDS:   Not appropriate for education at this time  Skin:  Skin Assessment: Reviewed RN Assessment  Last BM:  3/8  Height:   Ht Readings from Last 1 Encounters:  02/19/20 5\' 3"  (1.6 m)    Weight:   Wt Readings from Last 1 Encounters:  02/19/20 68.2 kg    BMI:  Body mass index is 26.63 kg/m.  Estimated Nutritional Needs:   Kcal:  1226 kcal  Protein:  100-120 grams  Fluid:  >/= 1.2 L/day   Mariana Single RD, LDN Clinical Nutrition Pager listed in [place]

## 2020-02-20 NOTE — Progress Notes (Signed)
NAME:  [name] [name], MRN:  [id number], DOB:  04-28-1946, LOS: 62 ADMISSION DATE:  02/05/2020, CONSULTATION DATE:  3/8 REFERRING MD:  Dr. Wynetta Emery, TRH, CHIEF COMPLAINT:  Shock    Brief History   74 y/o F who was admitted 2/26 with reports of weakness, falls, cough and SOB.  The patient was treated as CAP & AECOPD with improvement and was discharged 3/8 am to a SNF but was found pale, agonal with SBP in the 60's around 1pm, intubated / transferred to ER for further evaluation. CT ABD demonstrated a large left retroperitoneal bleed, Hgb 4.5.  Massive transfusion protocol initiated, pt transferred to [facility].   Past Medical History  Severe Aortic Stenosis s/p TAVR 01/10/19, completed 6 months DAPT CVA HTN HLD CKD III  Rheumatoid Arthritis  Hypothyroidism  DM II  COPD / Chronic Bronchitis   Significant Hospital Events   2/26 Admit  3/08 Discharged to SNF, found agonal prior to transport. RP bleed with Hgb 4.5.  3/09 Remains in shock, coagulopathic   Consults:  PCCM   Procedures:  ETT 3/8 >>  R IJ TLC 3/8 >>   Significant Diagnostic Tests:   CT Head 3/8 >> no evidence of acute intracranial injury, stable chronic findings   CT Chest w/o 3/8 >> bilateral bronchitis with mucous plugging in the bilateral upper lobe airways, tracheobronchial aspiration, small left sided effusion, persistent ground glass opacities in the R lung, possible tracheobronchomalacia , stable mild cardiomegaly with four-vessel coronary calcification, remote AV repair   CT ABD/Pelvis 3/8 >> interval development of a large left retroperitoneal hematoma extending into the left iliopsoas and the pelvis. Mild circumferential rectal wall thickening, potentially representing a mild uncomplicated nonspecific proctitis, interval development of moderate perinephric inflammatory changes, mild sigmoid diverticulosis, no freeintraperitoneal air or abscess, small amt of dense material in the dependent gastric fundus  MRI 3/9  >>  Micro Data:  RVP 2/28 >> rhinovirus detected COVID 3/03 >> negative  BCx2 3/8 >>  MRSA PCR 3/8 >> negative   Antimicrobials:  Cefepime 3/9 (empiric pulmonary) 3/9 >>   Interim history/subjective:  RN reports pt received 2 PRBC, 2 FFP, 1 pk platelets overnight.  No change in abdominal exam per RN Hgb drifted down to 7.6 from 9.3 post transfusion INR remains elevated despite FFP, vitamin K  Glucose range 89-134 Tmax 99.7 / WBC 28.2  I/O 95ml UOP, 9.5L+ in 24h   Objective   Blood pressure (!) 119/56, pulse (!) 59, temperature (!) 97.2 F (36.2 C), resp. rate 18, height 5\' 3"  (1.6 m), weight 68.2 kg, SpO2 100 %. CVP:  [7 mmHg-25 mmHg] 9 mmHg  Vent Mode: PRVC FiO2 (%):  [30 %-100 %] 30 % Set Rate:  [16 bmp] 16 bmp Vt Set:  [420 mL] 420 mL PEEP:  [5 cmH20] 5 cmH20 Plateau Pressure:  [16 cmH20-23 cmH20] 18 cmH20   Intake/Output Summary (Last 24 hours) at 02/20/2020 0931 Last data filed at 02/20/2020 0900 Gross per 24 hour  Intake 11237.37 ml  Output 122 ml  Net 11115.37 ml   Filed Weights   02/18/20 0500 02/19/20 0606 02/19/20 1354  Weight: 75.1 kg 69.7 kg 68.2 kg    Examination: General: critically ill appearing adult female lying in bed on vent  HEENT: MM pink/moist, ETT, pupils 45mm Neuro: sedate on precedex, turned off by RN for neuro exam  CV: s1s2 RRR, no m/r/g PULM:  Non-labored on vent, lungs bilaterally clear GI: soft, bsx4 active  Extremities: warm/dry, no edema  Skin: no rashes or lesions  Resolved Hospital Problem list      Assessment & Plan:   Hemorrhagic Shock secondary to Retroperitoneal Bleed -volume resuscitation with PRBC's, FFP -levophed for MAP >65 -now vitamin K as below  -assess troponin, EKG with hypotension  -repeat labs, will make decision regarding further PRBC transfusion post 12 noon labs  Shock Liver  In setting of hemorrhage, hypotension  -trend LFT's, repeat labs at 1200  -avoid hepatotoxic agents, discontinue zocor, tylenol  with elevated LFT's  -ensure hepatic perfusion   Acute Blood Loss Anemia  Coagulopathy  -repeat 10mg  Vitamin K now -additional FFP now  -trend CBC  -transfuse for Hgb <7%, or active bleeding   AKI on CKD III  AGMA Baseline sr cr ~ 1.5.  -Trend BMP / urinary output -Replace electrolytes as indicated -Avoid nephrotoxic agents, ensure adequate renal perfusion  Acute Metabolic Encephalopathy  Rule out anoxic injury with hypotension, bleeding  -MRI once stabilized  -PAD protocol with RASS Goal of 0 to -1  -hold sedation this am for neuro assessment  -PRN fentanyl for pain  Acute Respiratory Failure  In setting of recent CAP, suspected aspiration, acute hemorrhage -PRVC 8cc/kg  -wean PEEP / FiO2 for sats >90% -daily SBT / WUA as tolerated   Chronic Diastolic CHF  EKG with Incomplete RBBB - does not appear new  Hx TAVR 12/2018 ECHO 2/27 with LVEF 55-60%, no WMA, Grade I DD, mild MR, s/p TAVR -monitor, supportive care for now -no diuresis with shock   DM II  -SSI, sensitive scale   Hypothyroidism  -continue synthroid  -TSH improving   Hx CVA, Frequent Falls  -PT consult when appropriate.  Was pending discharge to SNF at time of admit.   Best practice:  Diet: NPO  Pain/Anxiety/Delirium protocol (if indicated): Precedex  VAP protocol (if indicated): In place  DVT prophylaxis: SCD's  GI prophylaxis: PPI  Glucose control: SSI  Mobility: BR  Code Status: Full Code Family Communication: Called numbers listed for daughters Sampson Goon but neither have VM set up.  Unable to leave a message.  Sister Joanne Chars) called for update 3/9. Other "adopted" daughter [name] # [phone] Disposition: ICU  Labs   CBC: Recent Labs  Lab 02/15/20 0412 02/15/20 0412 02/18/20 0602 02/19/20 1345 02/19/20 2241 02/20/20 0255 02/20/20 0810  WBC 12.6*  --  18.3* 11.0*  --  28.2*  --   NEUTROABS  --   --   --  8.3*  --   --   --   HGB 10.8*   < > 9.6* 4.5* 6.8* 9.3* 7.6*  HCT  33.8*   < > 29.9* 14.7* 21.2* 27.6* 22.7*  MCV 88.0  --  87.4 94.2  --  87.9  --   PLT 389  --  285 130*  --  126*  --    < > = values in this interval not displayed.    Basic Metabolic Panel: Recent Labs  Lab 02/15/20 0412 02/15/20 0412 02/16/20 0408 02/16/20 0408 02/17/20 0615 02/17/20 0615 02/18/20 0602 02/19/20 0450 02/19/20 1345 02/19/20 2241 02/20/20 0255  NA 134*   < > 138   < > 140   < > 142 143 139 144 145  K 4.8   < > 4.7   < > 4.2   < > 3.9 3.6 5.6* 5.3* 5.0  CL 101   < > 106   < > 107   < > 106 104 107 113* 110  CO2  20*   < > 22   < > 24   < > 27 31 18* 16* 18*  GLUCOSE 203*   < > 82   < > 79   < > 214* 292* 238* 95 134*  BUN 101*   < > 103*   < > 94*   < > 78* 60* 56* 54* 53*  CREATININE 3.52*   < > 3.23*   < > 2.79*   < > 2.26* 1.94* 2.12* 2.40* 2.50*  CALCIUM 7.7*   < > 7.6*   < > 7.8*   < > 8.1* 8.0* 6.6* 7.4* 7.7*  PHOS 6.4*  --  6.4*  --  6.0*  --  4.5 3.6  --   --   --    < > = values in this interval not displayed.   GFR: Estimated Creatinine Clearance: 18.3 mL/min (A) (by C-G formula based on SCr of 2.5 mg/dL (H)). Recent Labs  Lab 02/15/20 0412 02/18/20 0602 02/19/20 1345 02/19/20 1537 02/19/20 1546 02/19/20 1702 02/20/20 0255 02/20/20 0309  PROCALCITON  --   --   --  0.16  --   --  4.10  --   WBC 12.6* 18.3* 11.0*  --   --   --  28.2*  --   LATICACIDVEN  --   --   --   --  10.3* 7.1*  --  5.1*    Liver Function Tests: Recent Labs  Lab 02/17/20 0615 02/18/20 0602 02/19/20 0450 02/19/20 1345 02/20/20 0255  AST  --   --   --  740* >10,000*  ALT  --   --   --  780* 9,338*  ALKPHOS  --   --   --  82 113  BILITOT  --   --   --  1.0 1.6*  PROT  --   --   --  3.5* 4.9*  ALBUMIN 3.1* 3.0* 2.7* 1.6* 2.5*   No results for input(s): LIPASE, AMYLASE in the last 168 hours. Recent Labs  Lab 02/20/20 0128  AMMONIA 65*    ABG    Component Value Date/Time   PHART 7.346 (L) 02/19/2020 1416   PCO2ART 32.8 02/19/2020 1416   PO2ART 357 (H)  02/19/2020 1416   HCO3 18.8 (L) 02/19/2020 1416   TCO2 29 01/10/2019 1344   ACIDBASEDEF 7.1 (H) 02/19/2020 1416   O2SAT 99.3 02/19/2020 1416     Coagulation Profile: Recent Labs  Lab 02/19/20 1345 02/19/20 1701 02/20/20 0255  INR 1.8* 2.4* 2.2*    Cardiac Enzymes: No results for input(s): CKTOTAL, CKMB, CKMBINDEX, TROPONINI in the last 168 hours.  HbA1C: Hgb A1c MFr Bld  Date/Time Value Ref Range Status  02/10/2020 02:24 AM 6.6 (H) 4.8 - 5.6 % Final    Comment:    (NOTE) Pre diabetes:          5.7%-6.4% Diabetes:              >6.4% Glycemic control for   <7.0% adults with diabetes   01/14/2019 04:15 AM 9.5 (H) 4.8 - 5.6 % Final    Comment:    (NOTE) Pre diabetes:          5.7%-6.4% Diabetes:              >6.4% Glycemic control for   <7.0% adults with diabetes     CBG: Recent Labs  Lab 02/19/20 1608 02/19/20 2058 02/20/20 0007 02/20/20 0406 02/20/20 0853  GLUCAP 210* 91 99  89 109*     Critical care time: 50 minutes      Noe Gens, MSN, NP-C [facility] 02/20/2020, 9:31 AM   Please see [email or web address] for pager details.

## 2020-02-20 NOTE — Progress Notes (Signed)
CRITICAL VALUE ALERT  Critical Value:  HGB 6.1 & Troponin 380  Date & Time Notied:  02/20/2020 1837  Provider Notified: Vaughan Browner, MD  Orders Received/Actions taken: Orders received for 2 units of RBCs and 2 units of FFP. Post transfusion labs of CBC and INR to be completed after transfusion.

## 2020-02-20 NOTE — Progress Notes (Signed)
Pharmacy Antibiotic Note  [name] [name] is a 74 y.o. female admitted on 02/04/2020 with sepsis/possible aspiration.  Pharmacy has been consulted to restart cefepime.  Afebrile, wbc 28, AKI was improving but has worsened today with scr trending up to 2.9 with urine output slowing. Antibiotics were stopped, now restarting cefepime for empiric pulmonary coverage.   Plan: Cefepime 1gm IV q24h for now - if renal function improves will increase as indicated F/u cxs and clinical progress Monitor V/S, labs and levels as indicated  Height: 5\' 3"  (160 cm) Weight: 150 lb 5.7 oz (68.2 kg) IBW/kg (Calculated) : 52.4  Temp (24hrs), Avg:97.1 F (36.2 C), Min:95.4 F (35.2 C), Max:99.7 F (37.6 C)  Recent Labs  Lab 02/15/20 0412 02/16/20 0408 02/18/20 0602 02/19/20 0450 02/19/20 1345 02/19/20 1546 02/19/20 1702 02/19/20 2241 02/20/20 0255 02/20/20 0309  WBC 12.6*  --  18.3*  --  11.0*  --   --   --  28.2*  --   CREATININE 3.52*   < > 2.26* 1.94* 2.12*  --   --  2.40* 2.50*  --   LATICACIDVEN  --   --   --   --   --  10.3* 7.1*  --   --  5.1*   < > = values in this interval not displayed.    Estimated Creatinine Clearance: 18.3 mL/min (A) (by C-G formula based on SCr of 2.5 mg/dL (H)).    No Known Allergies  Antimicrobials this admission: Vancomycin 3/8 >> 3/8 Cefepime 3/8>> Metronidazole 3/8>> 3/8 Azith 2/26 >>3/3 CTX 2/26 >> 3/3  Microbiology results: 3/8 BCx: ngtd  MRSA PCR: neg  Thank you for allowing pharmacy to be a part of this patient's care.  Erin Hearing PharmD., BCPS Clinical Pharmacist 02/20/2020 11:20 AM

## 2020-02-20 NOTE — Procedures (Signed)
Hemodialysis Catheter Insertion Procedure Note [name] [name] [id number] 1946-07-04  Procedure: Insertion of Hemodialysis Catheter Indications: Dialysis Access   Procedure Details Consent: Risks of procedure as well as the alternatives and risks of each were explained to the (patient/caregiver).  Consent for procedure obtained.   Time Out: Verified patient identification, verified procedure, site/side was marked, verified correct patient position, special equipment/implants available, medications/allergies/relevent history reviewed, required imaging and test results available.  Performed  Maximum sterile technique was used including antiseptics, cap, gloves, gown, hand hygiene, mask and sheet. Skin prep: Chlorhexidine; local anesthetic administered Triple lumen hemodialysis catheter was inserted into left internal jugular vein to 20 cm using the Seldinger technique. Biopatch placed, line sutured in place.   Evaluation Blood flow good Complications: No apparent complications Patient did tolerate procedure well. Chest X-ray ordered to verify placement.  CXR: pending.   Procedure performed under direct supervision of Dr. Vaughan Browner and with ultrasound guidance for real time vessel cannulation.     [name] Gens, MSN, NP-C [facility] 02/20/2020, 2:36 PM   Please see [email or web address] for pager details.

## 2020-02-20 NOTE — Progress Notes (Signed)
CRITICAL VALUE ALERT  Critical Value:  Lactic acid 5.1  Date & Time Notied:  02/20/20 0430  Provider Notified: elink  Orders Received/Actions taken: awaiting new orders

## 2020-02-20 NOTE — Progress Notes (Signed)
Rt Note:  RT x 3 unsuccessful in placing Arterial line.  Able to obtain flash but unsuccessful with threading of cather. RN & Elink aware.

## 2020-02-21 ENCOUNTER — Inpatient Hospital Stay (HOSPITAL_COMMUNITY): Payer: Medicare HMO

## 2020-02-21 DIAGNOSIS — R58 Hemorrhage, not elsewhere classified: Secondary | ICD-10-CM

## 2020-02-21 LAB — CBC
HCT: 23.2 % — ABNORMAL LOW (ref 36.0–46.0)
HCT: 21.8 % — ABNORMAL LOW (ref 36.0–46.0)
HCT: 20.2 % — ABNORMAL LOW (ref 36.0–46.0)
Hemoglobin: 7.5 g/dL — ABNORMAL LOW (ref 12.0–15.0)
Hemoglobin: 7.2 g/dL — ABNORMAL LOW (ref 12.0–15.0)
Hemoglobin: 6.8 g/dL — CL (ref 12.0–15.0)
MCH: 30.2 pg (ref 26.0–34.0)
MCH: 30.6 pg (ref 26.0–34.0)
MCH: 30.6 pg (ref 26.0–34.0)
MCHC: 32.3 g/dL (ref 30.0–36.0)
MCHC: 33 g/dL (ref 30.0–36.0)
MCHC: 33.7 g/dL (ref 30.0–36.0)
MCV: 93.5 fL (ref 80.0–100.0)
MCV: 92.8 fL (ref 80.0–100.0)
MCV: 91 fL (ref 80.0–100.0)
Platelets: 60 10*3/uL — ABNORMAL LOW (ref 150–400)
Platelets: 59 10*3/uL — ABNORMAL LOW (ref 150–400)
Platelets: 57 10*3/uL — ABNORMAL LOW (ref 150–400)
RBC: 2.48 MIL/uL — ABNORMAL LOW (ref 3.87–5.11)
RBC: 2.35 MIL/uL — ABNORMAL LOW (ref 3.87–5.11)
RBC: 2.22 MIL/uL — ABNORMAL LOW (ref 3.87–5.11)
RDW: 14.4 % (ref 11.5–15.5)
RDW: 14.6 % (ref 11.5–15.5)
RDW: 14.6 % (ref 11.5–15.5)
WBC: 22.2 10*3/uL — ABNORMAL HIGH (ref 4.0–10.5)
WBC: 21.8 10*3/uL — ABNORMAL HIGH (ref 4.0–10.5)
WBC: 23.2 10*3/uL — ABNORMAL HIGH (ref 4.0–10.5)
nRBC: 2.3 % — ABNORMAL HIGH (ref 0.0–0.2)
nRBC: 2.8 % — ABNORMAL HIGH (ref 0.0–0.2)
nRBC: 2.7 % — ABNORMAL HIGH (ref 0.0–0.2)

## 2020-02-21 LAB — COMPREHENSIVE METABOLIC PANEL
ALT: 6336 U/L — ABNORMAL HIGH (ref 0–44)
AST: 9432 U/L — ABNORMAL HIGH (ref 15–41)
Albumin: 2.6 g/dL — ABNORMAL LOW (ref 3.5–5.0)
Alkaline Phosphatase: 182 U/L — ABNORMAL HIGH (ref 38–126)
Anion gap: 21 — ABNORMAL HIGH (ref 5–15)
BUN: 60 mg/dL — ABNORMAL HIGH (ref 8–23)
CO2: 13 mmol/L — ABNORMAL LOW (ref 22–32)
Calcium: 7.1 mg/dL — ABNORMAL LOW (ref 8.9–10.3)
Chloride: 111 mmol/L (ref 98–111)
Creatinine, Ser: 3.43 mg/dL — ABNORMAL HIGH (ref 0.44–1.00)
GFR calc Af Amer: 14 mL/min — ABNORMAL LOW (ref >60)
GFR calc non Af Amer: 12 mL/min — ABNORMAL LOW (ref >60)
Glucose, Bld: 76 mg/dL (ref 70–99)
Potassium: 4.9 mmol/L (ref 3.5–5.1)
Sodium: 145 mmol/L (ref 135–145)
Total Bilirubin: 1.9 mg/dL — ABNORMAL HIGH (ref 0.3–1.2)
Total Protein: 4.8 g/dL — ABNORMAL LOW (ref 6.5–8.1)

## 2020-02-21 LAB — BPAM FFP
Blood Product Expiration Date: 202103122359
Blood Product Expiration Date: 202103122359
Blood Product Expiration Date: 202103102359
Blood Product Expiration Date: 202103142359
Blood Product Expiration Date: 202103142359
ISSUE DATE / TIME: 202103090627
ISSUE DATE / TIME: 202103090627
ISSUE DATE / TIME: 202103091109
ISSUE DATE / TIME: 202103091938
ISSUE DATE / TIME: 202103091938
Unit Type and Rh: 600
Unit Type and Rh: 6200
Unit Type and Rh: 7300
Unit Type and Rh: 6200
Unit Type and Rh: 6200

## 2020-02-21 LAB — PREPARE FRESH FROZEN PLASMA
Unit division: 0
Unit division: 0
Unit division: 0
Unit division: 0
Unit division: 0

## 2020-02-21 LAB — GLUCOSE, CAPILLARY
Glucose-Capillary: 102 mg/dL — ABNORMAL HIGH (ref 70–99)
Glucose-Capillary: 103 mg/dL — ABNORMAL HIGH (ref 70–99)
Glucose-Capillary: 143 mg/dL — ABNORMAL HIGH (ref 70–99)
Glucose-Capillary: 159 mg/dL — ABNORMAL HIGH (ref 70–99)
Glucose-Capillary: 180 mg/dL — ABNORMAL HIGH (ref 70–99)
Glucose-Capillary: 192 mg/dL — ABNORMAL HIGH (ref 70–99)
Glucose-Capillary: 59 mg/dL — ABNORMAL LOW (ref 70–99)
Glucose-Capillary: >600 mg/dL (ref 70–99)

## 2020-02-21 LAB — PREPARE RBC (CROSSMATCH)

## 2020-02-21 LAB — HEMOGLOBIN AND HEMATOCRIT, BLOOD
HCT: 26.4 % — ABNORMAL LOW (ref 36.0–46.0)
Hemoglobin: 8.6 g/dL — ABNORMAL LOW (ref 12.0–15.0)

## 2020-02-21 LAB — MAGNESIUM: Magnesium: 2.2 mg/dL (ref 1.7–2.4)

## 2020-02-21 MED ORDER — VITAL HIGH PROTEIN PO LIQD
1000.0000 mL | ORAL | Status: DC
  Administered 2020-02-21 – 2020-02-22 (×3): 1000 mL

## 2020-02-21 MED ORDER — DARBEPOETIN ALFA 100 MCG/0.5ML IJ SOSY
100.0000 ug | PREFILLED_SYRINGE | INTRAMUSCULAR | Status: DC
  Administered 2020-02-21: 100 ug via SUBCUTANEOUS
  Filled 2020-02-21: qty 0.5

## 2020-02-21 MED ORDER — ADULT MULTIVITAMIN LIQUID CH
15.0000 mL | Freq: Every day | ORAL | Status: DC
  Administered 2020-02-21 – 2020-02-22 (×2): 15 mL
  Filled 2020-02-21 (×2): qty 15

## 2020-02-21 MED ORDER — DEXTROSE 10 % IV SOLN: INTRAVENOUS | Status: DC

## 2020-02-21 MED ORDER — SODIUM BICARBONATE-DEXTROSE 150-5 MEQ/L-% IV SOLN
150.0000 meq | INTRAVENOUS | Status: DC
  Filled 2020-02-21: qty 1000

## 2020-02-21 MED ORDER — SODIUM CHLORIDE 0.9% IV SOLUTION: Freq: Once | INTRAVENOUS | Status: DC

## 2020-02-21 MED ORDER — VASOPRESSIN 20 UNIT/ML IV SOLN
0.0300 [IU]/min | INTRAVENOUS | Status: DC
  Administered 2020-02-21 – 2020-02-23 (×3): 0.03 [IU]/min via INTRAVENOUS
  Filled 2020-02-21 (×3): qty 2

## 2020-02-21 MED ORDER — DEXTROSE 50 % IV SOLN
12.5000 g | INTRAVENOUS | Status: AC
  Administered 2020-02-21: 12.5 g via INTRAVENOUS

## 2020-02-21 MED ORDER — DEXTROSE-NACL 5-0.9 % IV SOLN: INTRAVENOUS | Status: DC

## 2020-02-21 MED ORDER — ALBUMIN HUMAN 5 % IV SOLN
12.5000 g | Freq: Once | INTRAVENOUS | Status: AC
  Administered 2020-02-21: 12.5 g via INTRAVENOUS
  Filled 2020-02-21: qty 250

## 2020-02-21 MED ORDER — SODIUM BICARBONATE 8.4 % IV SOLN
INTRAVENOUS | Status: DC
  Filled 2020-02-21 (×10): qty 150

## 2020-02-21 MED ORDER — NOREPINEPHRINE 16 MG/250ML-% IV SOLN
0.0000 ug/min | INTRAVENOUS | Status: DC
  Administered 2020-02-21: 25 ug/min via INTRAVENOUS
  Administered 2020-02-22: 15 ug/min via INTRAVENOUS
  Administered 2020-02-23: 24 ug/min via INTRAVENOUS
  Administered 2020-02-23: 40 ug/min via INTRAVENOUS
  Filled 2020-02-21 (×4): qty 250

## 2020-02-21 NOTE — Progress Notes (Signed)
Subjective:  Appreciate CCM placing vascath yesterday.  Remains pressor dependent- been hypoglycemic overnight- lactate and troponin positive- very little UOP but surprisingly kidney labs not terribly worse   Objective Vital signs in last 24 hours: Vitals:   02/21/20 0600 02/21/20 0615 02/21/20 0630 02/21/20 0645  BP: (!) 104/53 (!) 101/47 (!) 106/50 (!) 106/54  Pulse: 67 75 70 69  Resp: 20 (!) 25 20 (!) 22  Temp: 98.6 F (37 C) 98.6 F (37 C) 98.6 F (37 C) 98.6 F (37 C)  TempSrc:      SpO2: 100% 97% 96% 97%  Weight:      Height:       Weight change: 14.5 kg  Intake/Output Summary (Last 24 hours) at 02/21/2020 0704 Last data filed at 02/21/2020 0600 Gross per 24 hour  Intake 4820.95 ml  Output 206 ml  Net 4614.95 ml    Assessment/ Plan: Pt is a 74 y.o. yo female with COPD, RA, AS s/p TAVR, CKD who was admitted to [facility] with COPD exac/CAP complicated by AKI-  Was improving, then on 01/22/2020  had acute decompensation with hypotension and dec MS-  Found to have hemorrhagic shock from retroperitoneal bleed Assessment/Plan: 1. Renal-  Recent resolving AKI-  crt 1.94 on 3/8-  Then events of hemodynamic instability- recurrent AKI.  Surprisingly no acute indications for HD but suspect will require tomorrow if still minimal UOP.  The only issue is the bicarb of 13-  Will start bicarb drip to stabilize that situation.  Vascath in place left IJ 2. HTN/vol-  Recent hemorrhagic shock-  Still on pressors-  Exam indicates edema- some volume overload but Fio2 is 30 and CVP just over 10 so does not NEED uf 3. Anemia-  Hemorrhagic shock- transfusion PRN-  Platelets now down as well.  I can add ESA but likely wont make much difference in the short term  4. Metabolic acidosis-  Will start bicarb drip  5. Transaminitis-  Due to shock- improving slowly    Louis Meckel    Labs: Basic Metabolic Panel: Recent Labs  Lab 02/17/20 0615 02/17/20 0615 02/18/20 0602 02/18/20 0602  02/19/20 0450 02/19/20 1345 02/20/20 1051 02/20/20 1718 02/21/20 0230  NA 140   < > 142   < > 143   < > 145 146* 145  K 4.2   < > 3.9   < > 3.6   < > 4.7 5.0 4.9  CL 107   < > 106   < > 104   < > 112* 111 111  CO2 24   < > 27   < > 31   < > 18* 14* 13*  GLUCOSE 79   < > 214*   < > 292*   < > 117* 67* 76  BUN 94*   < > 78*   < > 60*   < > 58* 62* 60*  CREATININE 2.79*   < > 2.26*   < > 1.94*   < > 2.93* 3.12* 3.43*  CALCIUM 7.8*   < > 8.1*   < > 8.0*   < > 7.4* 7.2* 7.1*  PHOS 6.0*  --  4.5  --  3.6  --   --   --   --    < > = values in this interval not displayed.   Liver Function Tests: Recent Labs  Lab 02/20/20 0255 02/20/20 1051 02/21/20 0230  AST >10,000* >10,000* 9,432*  ALT 9,338* 8,321* 6,336*  ALKPHOS 113  115 182*  BILITOT 1.6* 1.8* 1.9*  PROT 4.9* 4.6* 4.8*  ALBUMIN 2.5* 2.4* 2.6*   No results for input(s): LIPASE, AMYLASE in the last 168 hours. Recent Labs  Lab 02/20/20 0128  AMMONIA 65*   CBC: Recent Labs  Lab 02/15/20 0412 02/15/20 0412 02/18/20 0602 02/18/20 0602 02/19/20 1345 02/19/20 2241 02/20/20 0255 02/20/20 0810 02/20/20 1718 02/21/20 0230 02/21/20 0559  WBC 12.6*   < > 18.3*   < > 11.0*  --  28.2*  --   --   --  22.2*  NEUTROABS  --   --   --   --  8.3*  --   --   --   --   --   --   HGB 10.8*   < > 9.6*   < > 4.5*   < > 9.3*   < > 6.1* 8.6* 7.5*  HCT 33.8*   < > 29.9*   < > 14.7*   < > 27.6*   < > 18.2* 26.4* 23.2*  MCV 88.0  --  87.4  --  94.2  --  87.9  --   --   --  93.5  PLT 389   < > 285   < > 130*  --  126*  --   --   --  60*   < > = values in this interval not displayed.   Cardiac Enzymes: No results for input(s): CKTOTAL, CKMB, CKMBINDEX, TROPONINI in the last 168 hours. CBG: Recent Labs  Lab 02/20/20 2026 02/20/20 2353 02/21/20 0424 02/21/20 0453 02/21/20 0506  GLUCAP 123* 90 59* >600* 103*    Iron Studies: No results for input(s): IRON, TIBC, TRANSFERRIN, FERRITIN in the last 72 hours. Studies/Results: CT ABDOMEN  PELVIS WO CONTRAST  Result Date: 02/19/2020 CLINICAL DATA:  Abdominal distension. Sepsis. Low hemoglobin and elevated lactic acid. EXAM: CT ABDOMEN AND PELVIS WITHOUT CONTRAST TECHNIQUE: Multidetector CT imaging of the abdomen and pelvis was performed following the standard protocol without IV contrast. Automatic exposure control utilized. COMPARISON:  August 18, 2018 FINDINGS: Lower chest: Reported on concomitant CT chest examination. Hepatobiliary: No acute abnormality. Decompressed or surgically absent gallbladder. Normal hepatic size and smooth hepatic contours. Pancreas: Diffuse atrophy and mild fatty infiltration without acute pancreatitis. Spleen: Stable normal splenic size and contours. New retroperitoneal hematoma extending in the left paracolic gutter adjacent to the splenic margin. Adrenals/Urinary Tract: Normal adrenal glands. Redemonstration of horseshoe kidney. Interval development of moderate perinephric inflammatory change, likely medical renal disease or infection. No hydronephrosis. Nonobstructing renal calcifications that are probably atherosclerotic in etiology. Decompressed bilateral ureters. Foley catheter decompression of the urinary bladder. Stomach/Bowel: Small amount of dense material in the dependent gastric fundus, similar to the prior study, probably dense ingested contents. Appropriately positioned enteric tube. No free intraperitoneal air or perigastric inflammatory change. Decompressed bowel without bowel obstruction. A left lower abdominal quadrant sigmoid mild diverticulosis with subtle adjacent inflammatory change without abscess or perforation, likely secondary to the adjacent hematoma rather than an acute diverticulitis. Mild circumferential rectal wall thickening. Small amount of 47 Hounsfield unit dependent hemorrhage layering in the presacral space. Vascular/Lymphatic: Interval development of a large left retroperitoneal hematoma measuring approximately 18 cm cranio  caudally by 9 cm transversely by 13 cm anteroposteriorly with intramuscular extension in the left iliapsoas musculature which is asymmetrically enlarged, and slight extension along the pelvic sidewall into the dependent pelvis and contralateral pelvic side wall. Aorto bi-iliac calcified atherosclerosis without an abdominal aortic aneurysm. Reproductive: Hysterectomy.  No adnexal cyst. Other: Mild abdominal wall edema and clustered foci of air likely secondary to therapeutic injections. Musculoskeletal: Diffuse bone demineralization. Remote lumbosacral posterior spinal stabilization and decompression. Anasarca below the waist. No apparent rib fracture. Critical Value/emergent results were called by telephone at the time of interpretation on 02/19/2020 at 5:30 pm to provider [facility] , who verbally acknowledged these results. IMPRESSION: 1. Interval development of a large left retroperitoneal hematoma extending into the left iliopsoas and the pelvis. 2. Mild circumferential rectal wall thickening, potentially representing a mild uncomplicated nonspecific proctitis. 3. Redemonstration of horseshoe kidney with interval development of moderate perinephric inflammatory changes, likely medical renal disease or acute urinary tract infection. 4. Mild sigmoid diverticulosis with adjacent mesenteric stranding likely secondary to the adjacent retroperitoneal hematoma rather than an acute diverticulitis. No free intraperitoneal air or abscess. 5. Mild abdominal wall anasarca and clustered foci of air, likely secondary to therapeutic injections. 6. Small amount of dense material in the dependent gastric fundus, probably dense ingested contents. Appropriate enteric tube placement. Aortic Atherosclerosis (ICD10-I70.0). Electronically Signed   By: Revonda Humphrey   On: 02/19/2020 17:31   CT HEAD WO CONTRAST  Result Date: 02/19/2020 CLINICAL DATA:  Fall EXAM: CT HEAD WITHOUT CONTRAST TECHNIQUE: Contiguous axial images were  obtained from the base of the skull through the vertex without intravenous contrast. COMPARISON:  01/13/2019 FINDINGS: Brain: There is no acute intracranial hemorrhage, mass-effect, or edema. Gray-white differentiation is preserved. There is no extra-axial fluid collection. Prominence of the ventricles and sulci reflects stable minor parenchymal volume loss. Patchy hypoattenuation in the supratentorial white matter is nonspecific but probably reflects stable mild to moderate chronic microvascular ischemic changes. Vascular: There is atherosclerotic calcification at the skull base. Skull: Calvarium is unremarkable. Sinuses/Orbits: No acute finding. Other: None. IMPRESSION: No evidence of acute intracranial injury. Stable chronic findings detailed above. Electronically Signed   By: Macy Mis M.D.   On: 02/19/2020 16:47   CT CHEST WO CONTRAST  Result Date: 02/19/2020 CLINICAL DATA:  Chronic dyspnea of uncertain etiology. Ventilated patient admitted February 09, 2020 following 3 days of weakness, cough, dyspnea and falling spells. EXAM: CT CHEST WITHOUT CONTRAST TECHNIQUE: Multidetector CT imaging of the chest was performed following the standard protocol without IV contrast. Automatic exposure control utilized. COMPARISON:  February 10, 2020 FINDINGS: Cardiovascular: Stable mild cardiomegaly with four-vessel coronary calcification and remote aortic valvular replacement. Right internal jugular central vascular catheter, its tip in the right cardiac atrium. Trace pericardial fluid. Mediastinum/Nodes: An endotracheal tube tip 1.8 cm above the carina. Small amount of aspirated material at the carina and right mainstem bronchus origin and distal lumen. A feeding tube catheter within the gastric lumen. Lungs/Pleura: Small calibers of the bilateral mainstem bronchi measuring 4.7 mm on the right and 5.4 mm on the left. Crescent shape of the distal trachea. Bilateral nonspecific bronchitis with mucous plugging in the  bilateral upper lobe airways. Bibasilar subpleural consolidation that may be atelectatic. Small left 20 Hounsfield unit pleural effusion. Central pulmonary vascular congestion without overt pulmonary edema. Persistent ground-glass opacities in the right lung. Subpleural atelectasis in the left upper lobe anteriorly and posteriorly. Upper Abdomen: Reported with concomitant CT abdomen pelvis study. Musculoskeletal: Diffuse bone demineralization and mild to moderate skeletal degenerative changes. A non-STAT PRA report was generated for support device adjustment. IMPRESSION: 1. Bilateral bronchitis with mucous plugging in the bilateral upper lobe airways and tracheobronchial aspiration. Unenhanced CT chest recommended in 3 months to document resolution. 2. Small left-sided pleural effusion. 3.  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 4. Endotracheal tube tip 1.8 cm above the carina; a 1.7 cm retraction recommended. 5. 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 6. Stable mild cardiomegaly with four-vessel coronary calcification and remote aortic valvular replacement. 7. Aortic Atherosclerosis (ICD10-I70.0). Electronically Signed   By: Revonda Humphrey   On: 02/19/2020 17:08   DG CHEST PORT 1 VIEW  Result Date: 02/20/2020 CLINICAL DATA:  Central line placement. Status post TAVR. EXAM: PORTABLE CHEST 1 VIEW COMPARISON:  Radiograph yesterday per FINDINGS: Left internal jugular dual lumen central catheter tip is in the lower SVC. No visualized pneumothorax. Right internal jugular central line, endotracheal tube, and enteric tube remain in place. Endotracheal tube tip is 3.2 cm from the carina. Post TAVR with mild cardiomegaly. Aortic atherosclerosis. Hazy retrocardiac opacity likely combination of pleural fluid and atelectasis. Low lung volumes. Streaky right midlung atelectasis. No pulmonary edema. IMPRESSION: 1. Tip of the left internal jugular dual lumen central catheter in  the lower SVC. No pneumothorax. 2. Endotracheal tube tip 3.2 cm from the carina. Remaining support apparatus are stable. 3. Low lung volumes with retrocardiac opacity likely combination of pleural fluid and atelectasis. Electronically Signed   By: Keith Rake M.D.   On: 02/20/2020 15:08   DG Chest Port 1 View  Result Date: 02/19/2020 CLINICAL DATA:  Check endotracheal tube placed EXAM: PORTABLE CHEST 1 VIEW COMPARISON:  02/19/2020 FINDINGS: Endotracheal tube, gastric catheter and right jugular central line are again seen and stable. Changes of prior TAVR are again noted. Aortic calcifications are seen. The lungs are well aerated bilaterally with mild left basilar atelectasis stable from the prior exam. No pneumothorax is seen. IMPRESSION: Tubes and lines as described above stable from the prior exam. Mild left basilar atelectasis stable from the prior exam. Electronically Signed   By: Inez Catalina M.D.   On: 02/19/2020 23:03   DG CHEST PORT 1 VIEW  Result Date: 02/19/2020 CLINICAL DATA:  Endotracheal tube placement. EXAM: PORTABLE CHEST 1 VIEW COMPARISON:  February 09, 2020. FINDINGS: Stable cardiomediastinal silhouette. Atherosclerosis of thoracic aorta is noted. Endotracheal tube tip is seen at the carina; withdrawal by 3-4 cm is recommended. No pneumothorax is noted. Right lung is clear. Mild left basilar atelectasis or infiltrate is noted. Status post transcatheter aortic valve repair. Bony thorax is unremarkable IMPRESSION: Endotracheal tube tip is seen at the carina; withdrawal by 3-4 cm is recommended. Mild left basilar subsegmental atelectasis or infiltrate is noted. Electronically Signed   By: Marijo Conception M.D.   On: 02/19/2020 14:34   DG Chest Port 1V same Day  Result Date: 02/19/2020 CLINICAL DATA:  Central line placement. EXAM: PORTABLE CHEST 1 VIEW COMPARISON:  February 19, 2020. FINDINGS: Stable cardiomegaly. Endotracheal and nasogastric tubes are unchanged in position. Atherosclerosis of  thoracic aorta is noted. No pneumothorax is noted. Right lung is clear. Mild left basilar atelectasis or infiltrate is noted with possible associated pleural effusion. Status post transcatheter aortic valve repair. Bony thorax is unremarkable. Interval placement of right internal jugular catheter with distal tip in expected position of cavoatrial junction. IMPRESSION: Interval placement of right internal jugular catheter with distal tip in expected position of cavoatrial junction. No pneumothorax is noted. Otherwise stable support apparatus. Mild left basilar atelectasis or infiltrate is noted with possible associated pleural effusion. Electronically Signed   By: Marijo Conception M.D.   On: 02/19/2020 15:19   Medications: Infusions: . sodium chloride Stopped (02/20/20 1630)  . ceFEPime (MAXIPIME) IV  Stopped (02/20/20 1533)  . dexmedetomidine (PRECEDEX) IV infusion 1.2 mcg/kg/hr (02/21/20 0600)  . dextrose 30 mL/hr at 02/21/20 0600  . norepinephrine (LEVOPHED) Adult infusion 16 mcg/min (02/21/20 0600)    Scheduled Medications: . sodium chloride   Intravenous Once  . arformoterol  15 mcg Nebulization BID  . budesonide (PULMICORT) nebulizer solution  0.5 mg Nebulization BID  . chlorhexidine gluconate (MEDLINE KIT)  15 mL Mouth Rinse BID  . Chlorhexidine Gluconate Cloth  6 each Topical Daily  . insulin aspart  0-9 Units Subcutaneous Q4H  . levothyroxine  125 mcg Per Tube Daily  . mouth rinse  15 mL Mouth Rinse BID  . mouth rinse  15 mL Mouth Rinse 10 times per day  . multivitamin  1 tablet Oral Daily  . pantoprazole (PROTONIX) IV  40 mg Intravenous Q24H  . sodium chloride flush  10-40 mL Intracatheter Q12H  . sodium chloride flush  3 mL Intravenous Q12H  . timolol  1 drop Both Eyes BID  . vitamin B-12  1,000 mcg Per Tube Daily    have reviewed scheduled and prn medications.  Physical Exam: General: sedated on vent Heart: RRR Lungs: ant clear Abdomen: distended Extremities: 1+ pitting  edema to dep areas Dialysis Access: left IJ vascath placed 3/9     02/21/2020,7:04 AM  LOS: 12 days

## 2020-02-21 NOTE — Progress Notes (Signed)
Hypoglycemic Event  CBG: 59  Treatment: D50 25 mL (12.5 gm)  Symptoms: None  Follow-up CBG: Time:506  CBG Result:102  Possible Reasons for Event: Unknown  Comments/MD notified:elink, dextrose drip started    Levorn Oleski M Dardan Shelton

## 2020-02-21 NOTE — Progress Notes (Signed)
eLink Physician-Brief Progress Note Patient Name: [name] [name] DOB: 10/17/1946 MRN: [id number]   Date of Service  02/21/2020  HPI/Events of Note  Hypoglycemia, lactic acid 5.1  eICU Interventions  D 10 %  at 30 ml/hour, Albumin 5 % 250 ml iv bolus x 1        Theophil Thivierge U [name] [name] 02/21/2020, 4:47 AM

## 2020-02-21 NOTE — Progress Notes (Signed)
Hypoglycemic Event  CBG: 13  Treatment: D50 25 mL (12.5 gm)  Symptoms: None  Follow-up CBG: Time:2026  CBG Result:123  Possible Reasons for Event: Unknown  Comments/MD notified:yes, no new orders    [name] [name]

## 2020-02-21 NOTE — Progress Notes (Addendum)
NAME:  [name] [name], MRN:  [id number], DOB:  03-14-1946, LOS: 20 ADMISSION DATE:  02/06/2020, CONSULTATION DATE:  3/8 REFERRING MD:  Dr. Wynetta Emery, TRH, CHIEF COMPLAINT:  Shock    Brief History   74 y/o F who was admitted 2/26 with reports of weakness, falls, cough and SOB.  The patient was treated as CAP & AECOPD with improvement and was discharged 3/8 am to a SNF but was found pale, agonal with SBP in the 60's around 1pm, intubated / transferred to ER for further evaluation. CT ABD demonstrated a large left retroperitoneal bleed, Hgb 4.5.  Massive transfusion protocol initiated, pt transferred to [facility].   Past Medical History  Severe Aortic Stenosis s/p TAVR 01/10/19, completed 6 months DAPT CVA HTN HLD CKD III  Rheumatoid Arthritis  Hypothyroidism  DM II  COPD / Chronic Bronchitis   Significant Hospital Events   2/26 Admit  3/08 Discharged to SNF, found agonal prior to transport. RP bleed with Hgb 4.5.  3/09 Remains in shock, coagulopathic.  Transfused PRBC overnight.   Consults:  PCCM   Procedures:  ETT 3/8 >>  R IJ TLC 3/8 >>   Significant Diagnostic Tests:   CT Head 3/8 >> no evidence of acute intracranial injury, stable chronic findings   CT Chest w/o 3/8 >> bilateral bronchitis with mucous plugging in the bilateral upper lobe airways, tracheobronchial aspiration, small left sided effusion, persistent ground glass opacities in the R lung, possible tracheobronchomalacia , stable mild cardiomegaly with four-vessel coronary calcification, remote AV repair   CT ABD/Pelvis 3/8 >> interval development of a large left retroperitoneal hematoma extending into the left iliopsoas and the pelvis. Mild circumferential rectal wall thickening, potentially representing a mild uncomplicated nonspecific proctitis, interval development of moderate perinephric inflammatory changes, mild sigmoid diverticulosis, no freeintraperitoneal air or abscess, small amt of dense material in the dependent  gastric fundus   Micro Data:  RVP 2/28 >> rhinovirus detected COVID 3/03 >> negative  BCx2 3/8 >>  MRSA PCR 3/8 >> negative   Antimicrobials:  Cefepime 3/9 (empiric pulmonary) 3/9 >>   Interim history/subjective:  Hgb decreased to 6.1, transfused with PRBC RT reports pt weaned on PSV but fatigued, transitioned back to full support Glucose 76-100 I/O 54ml UOP, 4.6L + in 24 hours   Objective   Blood pressure (!) 97/43, pulse 66, temperature 98.6 F (37 C), resp. rate (!) 26, height 5\' 3"  (1.6 m), weight 82.7 kg, SpO2 100 %. CVP:  [5 mmHg-15 mmHg] 15 mmHg  Vent Mode: PRVC FiO2 (%):  [30 %] 30 % Set Rate:  [16 bmp] 16 bmp Vt Set:  [420 mL] 420 mL PEEP:  [5 cmH20] 5 cmH20 Pressure Support:  [15 cmH20] 15 cmH20 Plateau Pressure:  [16 cmH20-20 cmH20] 16 cmH20   Intake/Output Summary (Last 24 hours) at 02/21/2020 1225 Last data filed at 02/21/2020 1200 Gross per 24 hour  Intake 4093.73 ml  Output 29 ml  Net 4064.73 ml   Filed Weights   02/19/20 0606 02/19/20 1354 02/21/20 0500  Weight: 69.7 kg 68.2 kg 82.7 kg    Examination: General: chronically ill appearing adult female  HEENT: MM pink/moist, ETT, mild icterus  Neuro: sedate, pupils 21mm =/reactive  CV: s1s2 RRR, no m/r/g PULM:  Non-labored on vent, lungs bilaterally coarse  GI: protuberant, tight, scattered areas of small ecchymosis noted on abdomen, decreased breath sounds Extremities: warm/dry, 1+ generalized edema  Skin: no rashes or lesions  Resolved Hospital Problem list  Assessment & Plan:   Hemorrhagic Shock secondary to Retroperitoneal Bleed -follow CBC  -levophed for MAP > 65 -add vasopressin  -follow INR -troponin flat -transfuse as below  Shock Liver  In setting of hemorrhage, hypotension  -follow LFT's -supportive care / ensure hepatic perfusion  -avoid hepatotoxic agents, hold zocor, tylenol   Acute Blood Loss Anemia  Coagulopathy  -trend CBC, INR  -transfuse for Hgb <7%  AKI on CKD  III  AGMA Baseline sr cr ~ 1.5.  -Trend BMP / urinary output -Replace electrolytes as indicated -Avoid nephrotoxic agents, ensure adequate renal perfusion  Acute Metabolic Encephalopathy  Rule out anoxic injury with hypotension, bleeding  -consider MRI  -PAD protocol, RASS goal of 0 to -1  -Precedex + PRN fentanyl   Acute Respiratory Failure  In setting of recent CAP, suspected aspiration, acute hemorrhage -PRVC 8cc/kg  -wean PEEP / FiO2 for sats >90% -daily SBT / WUA  -follow CXR   Chronic Diastolic CHF  EKG with Incomplete RBBB - does not appear new  Hx TAVR 12/2018 ECHO 2/27 with LVEF 55-60%, no WMA, Grade I DD, mild MR, s/p TAVR -monitor, supportive care  -no diuresis with shock   DM II  -SSI, sensitive scale   Hypothyroidism  -continue synthroid -f/u TSH as outpatient   Hx CVA, Frequent Falls  -PT/OT efforts when able  -Was pending discharge to SNF at time of admit.   Best practice:  Diet: NPO  Pain/Anxiety/Delirium protocol (if indicated): Precedex  VAP protocol (if indicated): In place  DVT prophylaxis: SCD's  GI prophylaxis: PPI  Glucose control: SSI  Mobility: BR  Code Status: Full Code Family Communication: Will call Sister Joanne Chars) for update 3/10. Other "adopted" daughter [name] # [phone] Disposition: ICU  Labs   CBC: Recent Labs  Lab 02/15/20 0412 02/15/20 0412 02/18/20 0602 02/18/20 0602 02/19/20 1345 02/19/20 2241 02/20/20 0255 02/20/20 0255 02/20/20 0810 02/20/20 1051 02/20/20 1718 02/21/20 0230 02/21/20 0559  WBC 12.6*  --  18.3*  --  11.0*  --  28.2*  --   --   --   --   --  22.2*  NEUTROABS  --   --   --   --  8.3*  --   --   --   --   --   --   --   --   HGB 10.8*   < > 9.6*   < > 4.5*   < > 9.3*   < > 7.6* 7.4* 6.1* 8.6* 7.5*  HCT 33.8*   < > 29.9*   < > 14.7*   < > 27.6*   < > 22.7* 21.7* 18.2* 26.4* 23.2*  MCV 88.0  --  87.4  --  94.2  --  87.9  --   --   --   --   --  93.5  PLT 389  --  285  --  130*  --  126*  --   --    --   --   --  60*   < > = values in this interval not displayed.    Basic Metabolic Panel: Recent Labs  Lab 02/15/20 0412 02/15/20 0412 02/16/20 0408 02/16/20 0408 02/17/20 0615 02/17/20 0615 02/18/20 0602 02/18/20 0602 02/19/20 0450 02/19/20 1345 02/19/20 2241 02/20/20 0255 02/20/20 1051 02/20/20 1718 02/21/20 0230  NA 134*   < > 138   < > 140   < > 142   < > 143   < > 144  145 145 146* 145  K 4.8   < > 4.7   < > 4.2   < > 3.9   < > 3.6   < > 5.3* 5.0 4.7 5.0 4.9  CL 101   < > 106   < > 107   < > 106   < > 104   < > 113* 110 112* 111 111  CO2 20*   < > 22   < > 24   < > 27   < > 31   < > 16* 18* 18* 14* 13*  GLUCOSE 203*   < > 82   < > 79   < > 214*   < > 292*   < > 95 134* 117* 67* 76  BUN 101*   < > 103*   < > 94*   < > 78*   < > 60*   < > 54* 53* 58* 62* 60*  CREATININE 3.52*   < > 3.23*   < > 2.79*   < > 2.26*   < > 1.94*   < > 2.40* 2.50* 2.93* 3.12* 3.43*  CALCIUM 7.7*   < > 7.6*   < > 7.8*   < > 8.1*   < > 8.0*   < > 7.4* 7.7* 7.4* 7.2* 7.1*  PHOS 6.4*  --  6.4*  --  6.0*  --  4.5  --  3.6  --   --   --   --   --   --    < > = values in this interval not displayed.   GFR: Estimated Creatinine Clearance: 14.7 mL/min (A) (by C-G formula based on SCr of 3.43 mg/dL (H)). Recent Labs  Lab 02/18/20 0602 02/19/20 1345 02/19/20 1537 02/19/20 1546 02/19/20 1702 02/20/20 0255 02/20/20 0309 02/20/20 1051 02/21/20 0559  PROCALCITON  --   --  0.16  --   --  4.10  --   --   --   WBC 18.3* 11.0*  --   --   --  28.2*  --   --  22.2*  LATICACIDVEN  --   --   --  10.3* 7.1*  --  5.1* 5.1*  --     Liver Function Tests: Recent Labs  Lab 02/19/20 0450 02/19/20 1345 02/20/20 0255 02/20/20 1051 02/21/20 0230  AST  --  740* >10,000* >10,000* 9,432*  ALT  --  780* 9,338* 8,321* 6,336*  ALKPHOS  --  82 113 115 182*  BILITOT  --  1.0 1.6* 1.8* 1.9*  PROT  --  3.5* 4.9* 4.6* 4.8*  ALBUMIN 2.7* 1.6* 2.5* 2.4* 2.6*   No results for input(s): LIPASE, AMYLASE in the last 168  hours. Recent Labs  Lab 02/20/20 0128  AMMONIA 65*    ABG    Component Value Date/Time   PHART 7.346 (L) 02/19/2020 1416   PCO2ART 32.8 02/19/2020 1416   PO2ART 357 (H) 02/19/2020 1416   HCO3 18.8 (L) 02/19/2020 1416   TCO2 29 01/10/2019 1344   ACIDBASEDEF 7.1 (H) 02/19/2020 1416   O2SAT 99.3 02/19/2020 1416     Coagulation Profile: Recent Labs  Lab 02/19/20 1345 02/19/20 1701 02/20/20 0255 02/20/20 1718  INR 1.8* 2.4* 2.2* 2.3*    Cardiac Enzymes: No results for input(s): CKTOTAL, CKMB, CKMBINDEX, TROPONINI in the last 168 hours.  HbA1C: Hgb A1c MFr Bld  Date/Time Value Ref Range Status  02/10/2020 02:24 AM 6.6 (H) 4.8 - 5.6 % Final  Comment:    (NOTE) Pre diabetes:          5.7%-6.4% Diabetes:              >6.4% Glycemic control for   <7.0% adults with diabetes   01/14/2019 04:15 AM 9.5 (H) 4.8 - 5.6 % Final    Comment:    (NOTE) Pre diabetes:          5.7%-6.4% Diabetes:              >6.4% Glycemic control for   <7.0% adults with diabetes     CBG: Recent Labs  Lab 02/20/20 2353 02/21/20 0424 02/21/20 0453 02/21/20 0506 02/21/20 0759  GLUCAP 90 59* >600* 103* 102*     Critical care time: 34 minutes      Noe Gens, MSN, NP-C [facility] 02/21/2020, 12:25 PM   Please see [email or web address] for pager details.    Pulmonary critical care attending:  74 year old female recent exacerbation for COPD.  Found to have a acute large left retroperitoneal bleed, hemoglobin 4.5 initiated massive transfusion protocol and transferred to [facility] for further evaluation.  Patient received blood transfusion shin since yesterday.  New lines placed for access.  BP (!) 122/38   Pulse (!) 53   Temp 98.4 F (36.9 C)   Resp (!) 26   Ht 5\' 3"  (1.6 m)   Wt 82.7 kg   SpO2 100%   BMI 32.30 kg/m   General: Elderly female intubated mechanical life support critically ill HEENT: NCAT, sclera clear, endotracheal tube in place Heart:  Regular rhythm S1-S2 Lungs: Bilateral mechanically breath sounds Abdomen: Severe distention.  Labs: Hemoglobin stable 7.2 Platelets 59 White blood cell count 21.8  Imaging: Chest x-ray with lower lobe atelectasis, small left-sided effusion history of TAVR. The patient's images have been independently reviewed by me.    Assessment: Hemorrhagic shock secondary to retroperitoneal bleed Now hemoglobin stable Shock liver, elevated LFTs secondary to hypotension Acute blood loss anemia Coagulopathy AKI on CKD Acute hypoxemic respiratory failure current intubation secondary to mechanical ventilation Chronic diastolic heart failure History of TAVR Type 2 diabetes  Plan: Continue to follow H&H Still remains on low-dose vasopressors to include Levophed, added additional vasopressin today Follow INR H&H stable continue to follow transfuse for hemoglobin less than 7 Continues to decline may need to consider other options for intervention Nephrology following.  May need to consider dialysis support at some point.  We appreciate their input.  This patient is critically ill with multiple organ system failure; which, requires frequent high complexity decision making, assessment, support, evaluation, and titration of therapies. This was completed through the application of advanced monitoring technologies and extensive interpretation of multiple databases. During this encounter critical care time was devoted to patient care services described in this note for 32 minutes.  Garner Nash, DO [facility] 02/21/2020 3:27 PM

## 2020-02-22 DIAGNOSIS — I350 Nonrheumatic aortic (valve) stenosis: Secondary | ICD-10-CM

## 2020-02-22 LAB — BPAM RBC
Blood Product Expiration Date: 202104072359
Blood Product Expiration Date: 202104072359
Blood Product Expiration Date: 202104082359
Blood Product Expiration Date: 202104082359
Blood Product Expiration Date: 202104112359
ISSUE DATE / TIME: 202103082311
ISSUE DATE / TIME: 202103082311
ISSUE DATE / TIME: 202103091937
ISSUE DATE / TIME: 202103091937
ISSUE DATE / TIME: 202103102037
Unit Type and Rh: 5100
Unit Type and Rh: 5100
Unit Type and Rh: 5100
Unit Type and Rh: 5100
Unit Type and Rh: 5100

## 2020-02-22 LAB — TYPE AND SCREEN
ABO/RH(D): O POS
Antibody Screen: NEGATIVE
Unit division: 0
Unit division: 0
Unit division: 0
Unit division: 0
Unit division: 0

## 2020-02-22 LAB — RENAL FUNCTION PANEL
Albumin: 2.4 g/dL — ABNORMAL LOW (ref 3.5–5.0)
Albumin: 2.5 g/dL — ABNORMAL LOW (ref 3.5–5.0)
Anion gap: 24 — ABNORMAL HIGH (ref 5–15)
Anion gap: 21 — ABNORMAL HIGH (ref 5–15)
BUN: 63 mg/dL — ABNORMAL HIGH (ref 8–23)
BUN: 49 mg/dL — ABNORMAL HIGH (ref 8–23)
CO2: 12 mmol/L — ABNORMAL LOW (ref 22–32)
CO2: 20 mmol/L — ABNORMAL LOW (ref 22–32)
Calcium: 6.5 mg/dL — ABNORMAL LOW (ref 8.9–10.3)
Calcium: 6.4 mg/dL — CL (ref 8.9–10.3)
Chloride: 106 mmol/L (ref 98–111)
Chloride: 104 mmol/L (ref 98–111)
Creatinine, Ser: 4.33 mg/dL — ABNORMAL HIGH (ref 0.44–1.00)
Creatinine, Ser: 3.37 mg/dL — ABNORMAL HIGH (ref 0.44–1.00)
GFR calc Af Amer: 11 mL/min — ABNORMAL LOW (ref >60)
GFR calc Af Amer: 15 mL/min — ABNORMAL LOW (ref >60)
GFR calc non Af Amer: 9 mL/min — ABNORMAL LOW (ref >60)
GFR calc non Af Amer: 13 mL/min — ABNORMAL LOW (ref >60)
Glucose, Bld: 225 mg/dL — ABNORMAL HIGH (ref 70–99)
Glucose, Bld: 148 mg/dL — ABNORMAL HIGH (ref 70–99)
Phosphorus: 9.9 mg/dL — ABNORMAL HIGH (ref 2.5–4.6)
Phosphorus: 7.7 mg/dL — ABNORMAL HIGH (ref 2.5–4.6)
Potassium: 6.2 mmol/L — ABNORMAL HIGH (ref 3.5–5.1)
Potassium: 5.3 mmol/L — ABNORMAL HIGH (ref 3.5–5.1)
Sodium: 142 mmol/L (ref 135–145)
Sodium: 145 mmol/L (ref 135–145)

## 2020-02-22 LAB — POCT I-STAT 7, (LYTES, BLD GAS, ICA,H+H)
Acid-base deficit: 6 mmol/L — ABNORMAL HIGH (ref 0.0–2.0)
Bicarbonate: 18.9 mmol/L — ABNORMAL LOW (ref 20.0–28.0)
Calcium, Ion: 0.82 mmol/L — CL (ref 1.15–1.40)
HCT: 25 % — ABNORMAL LOW (ref 36.0–46.0)
Hemoglobin: 8.5 g/dL — ABNORMAL LOW (ref 12.0–15.0)
O2 Saturation: 96 %
Patient temperature: 98.4
Potassium: 5.1 mmol/L (ref 3.5–5.1)
Sodium: 141 mmol/L (ref 135–145)
TCO2: 20 mmol/L — ABNORMAL LOW (ref 22–32)
pCO2 arterial: 34.5 mmHg (ref 32.0–48.0)
pH, Arterial: 7.345 — ABNORMAL LOW (ref 7.350–7.450)
pO2, Arterial: 82 mmHg — ABNORMAL LOW (ref 83.0–108.0)

## 2020-02-22 LAB — GLUCOSE, CAPILLARY
Glucose-Capillary: 15 mg/dL — CL (ref 70–99)
Glucose-Capillary: 199 mg/dL — ABNORMAL HIGH (ref 70–99)
Glucose-Capillary: 213 mg/dL — ABNORMAL HIGH (ref 70–99)
Glucose-Capillary: 164 mg/dL — ABNORMAL HIGH (ref 70–99)
Glucose-Capillary: 123 mg/dL — ABNORMAL HIGH (ref 70–99)
Glucose-Capillary: 86 mg/dL (ref 70–99)
Glucose-Capillary: 63 mg/dL — ABNORMAL LOW (ref 70–99)
Glucose-Capillary: 176 mg/dL — ABNORMAL HIGH (ref 70–99)

## 2020-02-22 LAB — CBC
HCT: 25.7 % — ABNORMAL LOW (ref 36.0–46.0)
HCT: 25.2 % — ABNORMAL LOW (ref 36.0–46.0)
HCT: 26 % — ABNORMAL LOW (ref 36.0–46.0)
Hemoglobin: 8.3 g/dL — ABNORMAL LOW (ref 12.0–15.0)
Hemoglobin: 8.3 g/dL — ABNORMAL LOW (ref 12.0–15.0)
Hemoglobin: 8.4 g/dL — ABNORMAL LOW (ref 12.0–15.0)
MCH: 30.6 pg (ref 26.0–34.0)
MCH: 30.2 pg (ref 26.0–34.0)
MCH: 29.9 pg (ref 26.0–34.0)
MCHC: 32.3 g/dL (ref 30.0–36.0)
MCHC: 32.9 g/dL (ref 30.0–36.0)
MCHC: 32.3 g/dL (ref 30.0–36.0)
MCV: 94.8 fL (ref 80.0–100.0)
MCV: 91.6 fL (ref 80.0–100.0)
MCV: 92.5 fL (ref 80.0–100.0)
Platelets: 49 10*3/uL — ABNORMAL LOW (ref 150–400)
Platelets: 41 10*3/uL — ABNORMAL LOW (ref 150–400)
Platelets: 42 10*3/uL — ABNORMAL LOW (ref 150–400)
RBC: 2.71 MIL/uL — ABNORMAL LOW (ref 3.87–5.11)
RBC: 2.75 MIL/uL — ABNORMAL LOW (ref 3.87–5.11)
RBC: 2.81 MIL/uL — ABNORMAL LOW (ref 3.87–5.11)
RDW: 14.5 % (ref 11.5–15.5)
RDW: 14.9 % (ref 11.5–15.5)
RDW: 15.1 % (ref 11.5–15.5)
WBC: 19.2 10*3/uL — ABNORMAL HIGH (ref 4.0–10.5)
WBC: 20.6 10*3/uL — ABNORMAL HIGH (ref 4.0–10.5)
WBC: 21.6 10*3/uL — ABNORMAL HIGH (ref 4.0–10.5)
nRBC: 4.8 % — ABNORMAL HIGH (ref 0.0–0.2)
nRBC: 5 % — ABNORMAL HIGH (ref 0.0–0.2)
nRBC: 6 % — ABNORMAL HIGH (ref 0.0–0.2)

## 2020-02-22 LAB — PROTIME-INR
INR: 2.9 — ABNORMAL HIGH (ref 0.8–1.2)
Prothrombin Time: 30.6 seconds — ABNORMAL HIGH (ref 11.4–15.2)

## 2020-02-22 LAB — HEMOGLOBIN AND HEMATOCRIT, BLOOD
HCT: 26.8 % — ABNORMAL LOW (ref 36.0–46.0)
Hemoglobin: 8.5 g/dL — ABNORMAL LOW (ref 12.0–15.0)

## 2020-02-22 LAB — MAGNESIUM
Magnesium: 2.3 mg/dL (ref 1.7–2.4)
Magnesium: 2.2 mg/dL (ref 1.7–2.4)

## 2020-02-22 MED ORDER — SODIUM BICARBONATE 8.4 % IV SOLN
INTRAVENOUS | Status: AC
  Filled 2020-02-22: qty 100

## 2020-02-22 MED ORDER — FENTANYL 2500MCG IN NS 250ML (10MCG/ML) PREMIX INFUSION
0.0000 ug/h | INTRAVENOUS | Status: DC
  Administered 2020-02-22: 25 ug/h via INTRAVENOUS
  Filled 2020-02-22: qty 250

## 2020-02-22 MED ORDER — CALCIUM GLUCONATE-NACL 1-0.675 GM/50ML-% IV SOLN
1.0000 g | Freq: Once | INTRAVENOUS | Status: AC
  Administered 2020-02-22: 1000 mg via INTRAVENOUS
  Filled 2020-02-22: qty 50

## 2020-02-22 MED ORDER — B COMPLEX-C PO TABS
1.0000 | ORAL_TABLET | Freq: Every day | ORAL | Status: DC
  Administered 2020-02-22 – 2020-02-23 (×2): 1
  Filled 2020-02-22 (×2): qty 1

## 2020-02-22 MED ORDER — SODIUM CHLORIDE 0.9 % IV SOLN
2.0000 g | Freq: Two times a day (BID) | INTRAVENOUS | Status: DC
  Administered 2020-02-22 – 2020-02-23 (×3): 2 g via INTRAVENOUS
  Filled 2020-02-22 (×4): qty 2

## 2020-02-22 MED ORDER — PRISMASOL BGK 4/2.5 32-4-2.5 MEQ/L IV SOLN: INTRAVENOUS | Status: DC

## 2020-02-22 MED ORDER — SODIUM POLYSTYRENE SULFONATE 15 GM/60ML PO SUSP
30.0000 g | Freq: Once | ORAL | Status: AC
  Administered 2020-02-22: 30 g
  Filled 2020-02-22: qty 120

## 2020-02-22 MED ORDER — DEXTROSE 50 % IV SOLN
INTRAVENOUS | Status: AC
  Administered 2020-02-22: 25 mL via INTRAVENOUS
  Filled 2020-02-22: qty 50

## 2020-02-22 MED ORDER — VITAL HIGH PROTEIN PO LIQD
1000.0000 mL | ORAL | Status: DC
  Administered 2020-02-22: 1000 mL

## 2020-02-22 MED ORDER — SODIUM BICARBONATE 8.4 % IV SOLN
100.0000 meq | Freq: Once | INTRAVENOUS | Status: AC
  Administered 2020-02-22: 100 meq via INTRAVENOUS

## 2020-02-22 MED ORDER — INSULIN ASPART 100 UNIT/ML ~~LOC~~ SOLN
0.0000 [IU] | SUBCUTANEOUS | Status: DC
  Administered 2020-02-22 (×2): 2 [IU] via SUBCUTANEOUS
  Administered 2020-02-22: 3 [IU] via SUBCUTANEOUS
  Administered 2020-02-22: 1 [IU] via SUBCUTANEOUS
  Administered 2020-02-23: 5 [IU] via SUBCUTANEOUS
  Administered 2020-02-23: 1 [IU] via SUBCUTANEOUS

## 2020-02-22 MED ORDER — STERILE WATER FOR INJECTION IV SOLN
INTRAVENOUS | Status: DC
  Filled 2020-02-22 (×10): qty 150

## 2020-02-22 MED ORDER — HEPARIN SODIUM (PORCINE) 1000 UNIT/ML DIALYSIS
1000.0000 [IU] | INTRAMUSCULAR | Status: DC | PRN
  Filled 2020-02-22: qty 6

## 2020-02-22 MED ORDER — SODIUM BICARBONATE 8.4 % IV SOLN
INTRAVENOUS | Status: DC
  Filled 2020-02-22 (×2): qty 150

## 2020-02-22 MED ORDER — PRISMASOL BGK 4/2.5 32-4-2.5 MEQ/L REPLACEMENT SOLN: Status: DC

## 2020-02-22 MED ORDER — SODIUM CHLORIDE 0.9 % FOR CRRT
INTRAVENOUS_CENTRAL | Status: DC | PRN
  Filled 2020-02-22 (×2): qty 1000

## 2020-02-22 NOTE — Progress Notes (Signed)
eLink Physician-Brief Progress Note Patient Name: [name] [name] DOB: 1946/01/19 MRN: [id number]   Date of Service  02/22/2020  HPI/Events of Note  Hyperglycemia - Blood glucose = 199.  eICU Interventions  Will order: 1. Q 4 hour sensitive Novolog SSI.     Intervention Category Major Interventions: Hyperglycemia - active titration of insulin therapy  Sommer,Steven Eugene 02/22/2020, 4:06 AM

## 2020-02-22 NOTE — Progress Notes (Signed)
CRITICAL VALUE ALERT  Critical Value:  Calcium 6.4  Date & Time Notied:  02/22/2020  3:48 PM  Orders Received/Actions taken: MD Ollis already ordered to replace calcium.

## 2020-02-22 NOTE — Progress Notes (Signed)
Subjective:    Remains pressor dependent- very little UOP - labs worse this AM including K over 6 and co2 of 12 even on bicarb drip   Objective Vital signs in last 24 hours: Vitals:   02/22/20 0400 02/22/20 0459 02/22/20 0500 02/22/20 0600  BP: (!) 98/43  (!) 100/44 (!) 81/56  Pulse: (!) 58  (!) 59 65  Resp: (!) 26  (!) 24 (!) 25  Temp: 98.2 F (36.8 C)  98.1 F (36.7 C) 98.1 F (36.7 C)  TempSrc: Bladder     SpO2: 99%   99%  Weight:  81.5 kg    Height:       Weight change: -1.2 kg  Intake/Output Summary (Last 24 hours) at 02/22/2020 0657 Last data filed at 02/22/2020 0600 Gross per 24 hour  Intake 3336.3 ml  Output 28 ml  Net 3308.3 ml    Assessment/ Plan: Pt is a 74 y.o. yo female with COPD, RA, AS s/p TAVR in past, CKD who was admitted to [facility] with COPD exac/CAP complicated by AKI-  Was improving, then on 01/24/2020  had acute decompensation with hypotension and dec MS-  Found to have hemorrhagic shock from retroperitoneal bleed Assessment/Plan: 1. Renal-  Recent resolving AKI-  crt 1.94 on 3/8 (baseline in mid 1's)-  Then events of hemodynamic instability- recurrent AKI. Are now at the place where RRT is required-  CRRT due to hemodynamic instability.   Vascath in place left IJ 2. HTN/vol-  Recent hemorrhagic shock-  Still on pressors-  Exam indicates edema- some volume overload -  Will start by running even but if stabilizes more will try to pull some volume  3. Anemia-  Hemorrhagic shock- transfusion PRN-  Platelets now down as well.  I can add ESA but likely wont make much difference in the short term  4. Metabolic acidosis-  Starting CRRT 5. Transaminitis-  Due to shock- improving slowly    [name] [name]    Labs: Basic Metabolic Panel: Recent Labs  Lab 02/18/20 0602 02/18/20 0602 02/19/20 0450 02/19/20 1345 02/20/20 1718 02/21/20 0230 02/22/20 0336  NA 142   < > 143   < > 146* 145 142  K 3.9   < > 3.6   < > 5.0 4.9 6.2*  CL 106   < > 104   < > 111 111  106  CO2 27   < > 31   < > 14* 13* 12*  GLUCOSE 214*   < > 292*   < > 67* 76 225*  BUN 78*   < > 60*   < > 62* 60* 63*  CREATININE 2.26*   < > 1.94*   < > 3.12* 3.43* 4.33*  CALCIUM 8.1*   < > 8.0*   < > 7.2* 7.1* 6.5*  PHOS 4.5  --  3.6  --   --   --  9.9*   < > = values in this interval not displayed.   Liver Function Tests: Recent Labs  Lab 02/20/20 0255 02/20/20 0255 02/20/20 1051 02/21/20 0230 02/22/20 0336  AST >10,000*  --  >10,000* 9,432*  --   ALT 9,338*  --  8,321* 6,336*  --   ALKPHOS 113  --  115 182*  --   BILITOT 1.6*  --  1.8* 1.9*  --   PROT 4.9*  --  4.6* 4.8*  --   ALBUMIN 2.5*   < > 2.4* 2.6* 2.4*   < > = values  in this interval not displayed.   No results for input(s): LIPASE, AMYLASE in the last 168 hours. Recent Labs  Lab 02/20/20 0128  AMMONIA 65*   CBC: Recent Labs  Lab 02/19/20 1345 02/19/20 2241 02/20/20 0255 02/20/20 0810 02/21/20 0559 02/21/20 0559 02/21/20 1210 02/21/20 1658 02/22/20 0336  WBC 11.0*  --  28.2*  --  22.2*   < > 21.8* 23.2* 19.2*  NEUTROABS 8.3*  --   --   --   --   --   --   --   --   HGB 4.5*   < > 9.3*   < > 7.5*   < > 7.2* 6.8* 8.3*  HCT 14.7*   < > 27.6*   < > 23.2*   < > 21.8* 20.2* 25.7*  MCV 94.2  --  87.9  --  93.5  --  92.8 91.0 94.8  PLT 130*  --  126*  --  60*   < > 59* 57* 49*   < > = values in this interval not displayed.   Cardiac Enzymes: No results for input(s): CKTOTAL, CKMB, CKMBINDEX, TROPONINI in the last 168 hours. CBG: Recent Labs  Lab 02/21/20 1237 02/21/20 1624 02/21/20 2012 02/21/20 2337 02/22/20 0341  GLUCAP 143* 192* 159* 180* 199*    Iron Studies: No results for input(s): IRON, TIBC, TRANSFERRIN, FERRITIN in the last 72 hours. Studies/Results: DG CHEST PORT 1 VIEW  Result Date: 02/21/2020 CLINICAL DATA:  ET tube, respiratory failure EXAM: PORTABLE CHEST 1 VIEW COMPARISON:  02/20/2020 FINDINGS: Support devices are stable. Prior TAVR. Heart is mildly enlarged. Low lung volumes  with left lower lobe atelectasis or infiltrate and layering left effusion. Minimal right base atelectasis. No acute bony abnormality. IMPRESSION: Left lower lobe atelectasis or infiltrate with small left effusion, unchanged. Right base atelectasis. Electronically Signed   By: Rolm Baptise M.D.   On: 02/21/2020 09:17   DG CHEST PORT 1 VIEW  Result Date: 02/20/2020 CLINICAL DATA:  Central line placement. Status post TAVR. EXAM: PORTABLE CHEST 1 VIEW COMPARISON:  Radiograph yesterday per FINDINGS: Left internal jugular dual lumen central catheter tip is in the lower SVC. No visualized pneumothorax. Right internal jugular central line, endotracheal tube, and enteric tube remain in place. Endotracheal tube tip is 3.2 cm from the carina. Post TAVR with mild cardiomegaly. Aortic atherosclerosis. Hazy retrocardiac opacity likely combination of pleural fluid and atelectasis. Low lung volumes. Streaky right midlung atelectasis. No pulmonary edema. IMPRESSION: 1. Tip of the left internal jugular dual lumen central catheter in the lower SVC. No pneumothorax. 2. Endotracheal tube tip 3.2 cm from the carina. Remaining support apparatus are stable. 3. Low lung volumes with retrocardiac opacity likely combination of pleural fluid and atelectasis. Electronically Signed   By: Keith Rake M.D.   On: 02/20/2020 15:08   Medications: Infusions: . sodium chloride Stopped (02/20/20 1630)  . ceFEPime (MAXIPIME) IV Stopped (02/21/20 1447)  . dexmedetomidine (PRECEDEX) IV infusion 0.4 mcg/kg/hr (02/22/20 0600)  . norepinephrine (LEVOPHED) Adult infusion 6 mcg/min (02/22/20 0600)  .  sodium bicarbonate  infusion 1000 mL 75 mL/hr at 02/22/20 0600  . vasopressin (PITRESSIN) infusion - *FOR SHOCK* 0.03 Units/min (02/22/20 0600)    Scheduled Medications: . sodium chloride   Intravenous Once  . arformoterol  15 mcg Nebulization BID  . budesonide (PULMICORT) nebulizer solution  0.5 mg Nebulization BID  . chlorhexidine  gluconate (MEDLINE KIT)  15 mL Mouth Rinse BID  . Chlorhexidine Gluconate Cloth  6 each  Topical Daily  . darbepoetin (ARANESP) injection - NON-DIALYSIS  100 mcg Subcutaneous Q Wed-1800  . feeding supplement (VITAL HIGH PROTEIN)  1,000 mL Per Tube Q24H  . insulin aspart  0-9 Units Subcutaneous Q4H  . levothyroxine  125 mcg Per Tube Daily  . mouth rinse  15 mL Mouth Rinse BID  . mouth rinse  15 mL Mouth Rinse 10 times per day  . multivitamin  15 mL Per Tube Daily  . pantoprazole (PROTONIX) IV  40 mg Intravenous Q24H  . sodium chloride flush  10-40 mL Intracatheter Q12H  . sodium chloride flush  3 mL Intravenous Q12H  . timolol  1 drop Both Eyes BID  . vitamin B-12  1,000 mcg Per Tube Daily    have reviewed scheduled and prn medications.  Physical Exam: General: sedated on vent Heart: RRR Lungs: ant clear Abdomen: distended Extremities: 1+ pitting edema to dep areas Dialysis Access: left IJ vascath placed 3/9     02/22/2020,6:57 AM  LOS: 13 days

## 2020-02-22 NOTE — Progress Notes (Addendum)
Pharmacy Antibiotic Note  [name] [name] is a 74 y.o. female admitted on 01/18/2020 with sepsis/possible aspiration.  Pharmacy has been consulted to dose cefepime for empiric pulmonary coverage.   Chest X-ray from 3/10 shows LLL atelectasis VS infiltrate amd right base atelectasis. Patient is afebrile. She remains ventilated on 40% FiO2. WBC count is elevated at 19.2 but has trended down since yesterday. Renal function has worsened with a creatinine increasing to 4.33 today with elevated electrolytes. Renal has initiated CRRT.   Microbiology results: 2/18 rhinovirus/enterovirus 3/1 Resp cx: normal flora  3/8 BCx: ngtd MRSA PCR: neg x 2 Covid neg x 3  Plan: Cefepime 2 grams IV Q12hours while on CRRT  F/u cxs, clinical progress, and length of therpay  Monitor renal function to adjust dose when CRRT stopped  Height: 5\' 3"  (160 cm) Weight: 179 lb 10.8 oz (81.5 kg) IBW/kg (Calculated) : 52.4  Temp (24hrs), Avg:98.1 F (36.7 C), Min:97.5 F (36.4 C), Max:98.6 F (37 C)  Recent Labs  Lab  0000 02/19/20 1546 02/19/20 1702 02/19/20 2241 02/20/20 0255 02/20/20 0309 02/20/20 1051 02/20/20 1718 02/21/20 0230 02/21/20 0559 02/21/20 1210 02/21/20 1658 02/22/20 0336  WBC   < >  --   --   --  28.2*  --   --   --   --  22.2* 21.8* 23.2* 19.2*  CREATININE  --   --   --    < > 2.50*  --  2.93* 3.12* 3.43*  --   --   --  4.33*  LATICACIDVEN  --  10.3* 7.1*  --   --  5.1* 5.1*  --   --   --   --   --   --    < > = values in this interval not displayed.    Estimated Creatinine Clearance: 11.5 mL/min (A) (by C-G formula based on SCr of 4.33 mg/dL (H)).    No Known Allergies  Antimicrobials this admission: Vancomycin 3/8 >> 3/8 Cefepime 3/8>> Metronidazole 3/8>> 3/8 Azith 2/26 >>3/3 CTX 2/26 >> 3/3    Thank you,   Eddie Candle, PharmD PGY-1 Pharmacy Resident   Please check amion for clinical pharmacist contact number

## 2020-02-22 NOTE — Progress Notes (Addendum)
NAME:  [name] [name], MRN:  [id number], DOB:  03/27/1946, LOS: 58 ADMISSION DATE:  02/10/2020, CONSULTATION DATE:  3/8 REFERRING MD:  Dr. Wynetta Emery, TRH, CHIEF COMPLAINT:  Shock    Brief History   74 y/o F who was admitted 2/26 with reports of weakness, falls, cough and SOB.  The patient was treated as CAP & AECOPD with improvement and was discharged 3/8 am to a SNF but was found pale, agonal with SBP in the 60's around 1pm, intubated / transferred to ER for further evaluation. CT ABD demonstrated a large left retroperitoneal bleed, Hgb 4.5.  Massive transfusion protocol initiated, pt transferred to [facility].   Past Medical History  Severe Aortic Stenosis s/p TAVR 01/10/19, completed 6 months DAPT CVA HTN HLD CKD III  Rheumatoid Arthritis  Hypothyroidism  DM II  COPD / Chronic Bronchitis   Significant Hospital Events   2/26 Admit  3/08 Discharged to SNF, found agonal prior to transport. RP bleed with Hgb 4.5.  3/09 Remains in shock, coagulopathic.  Transfused PRBC overnight.  3/10 Tx 1 unit overnight, failed PSV after 1 hour  3/11 CVVHD initiated   Consults:  PCCM   Procedures:  ETT 3/8 >>  R IJ TLC 3/8 >>  L IJ HD 3/9 >>  Significant Diagnostic Tests:   CT Head 3/8 >> no evidence of acute intracranial injury, stable chronic findings   CT Chest w/o 3/8 >> bilateral bronchitis with mucous plugging in the bilateral upper lobe airways, tracheobronchial aspiration, small left sided effusion, persistent ground glass opacities in the R lung, possible tracheobronchomalacia , stable mild cardiomegaly with four-vessel coronary calcification, remote AV repair   CT ABD/Pelvis 3/8 >> interval development of a large left retroperitoneal hematoma extending into the left iliopsoas and the pelvis. Mild circumferential rectal wall thickening, potentially representing a mild uncomplicated nonspecific proctitis, interval development of moderate perinephric inflammatory changes, mild sigmoid  diverticulosis, no freeintraperitoneal air or abscess, small amt of dense material in the dependent gastric fundus   Micro Data:  RVP 2/28 >> rhinovirus detected COVID 3/03 >> negative  BCx2 3/8 >>  MRSA PCR 3/8 >> negative   Antimicrobials:  Cefepime 3/9 (empiric pulmonary) 3/9 >>   Interim history/subjective:  RN reports intermittent periods of bradycardia  K elevated, CO2 12, BUN 63, Sr Cr 4.33, Hgb 8.3 I/O 18 ml UOP in last 24h, +3.3L Temp 94.6 / WBC 19.2   Objective   Blood pressure (!) 113/50, pulse (!) 44, temperature (!) 94.6 F (34.8 C), resp. rate (!) 21, height 5\' 3"  (1.6 m), weight 81.5 kg, SpO2 100 %. CVP:  [6 mmHg-15 mmHg] 9 mmHg  Vent Mode: PRVC FiO2 (%):  [30 %-40 %] 40 % Set Rate:  [16 bmp] 16 bmp Vt Set:  [420 mL] 420 mL PEEP:  [5 cmH20] 5 cmH20 Plateau Pressure:  [25 cmH20-33 cmH20] 33 cmH20   Intake/Output Summary (Last 24 hours) at 02/22/2020 1309 Last data filed at 02/22/2020 1300 Gross per 24 hour  Intake 3126.01 ml  Output 160 ml  Net 2966.01 ml   Filed Weights   02/19/20 1354 02/21/20 0500 02/22/20 0459  Weight: 68.2 kg 82.7 kg 81.5 kg    Examination: General: critically ill appearing adult female lying in bed on vent in NAD HEENT: MM pink/moist, ETT, R IJ TLC, L IJ HD, pupils 18mm =/reactive, scleral icterus  Neuro: off sedation, moves all extremities spontaneously but does not follow commands CV: s1s2 rrr, intermittent periods of bradycardia, no m/r/g PULM:  Non-labored on vent, lungs bilaterally with wheezing GI: soft, bsx4 active  Extremities: warm/dry, generalized 2+ edema  Skin: no rashes or lesions  Resolved Hospital Problem list      Assessment & Plan:   Hemorrhagic Shock secondary to Retroperitoneal Bleed -trend CBC, INR -continue levophed, vasopressin for MAP >65 -transfuse for Hgb <7% -may need to have IR evaluate for possible embolization given repeat transfusions  Shock Liver  In setting of hemorrhage, hypotension    -trend LFT's  -supportive care, ensure hepatic perfusion  -avoid hepatotoxic agents > hold home zocor, tylenol   Acute Blood Loss Anemia  Coagulopathy  -trend CBC, INR  -transfuse for Hgb <7%  AKI on CKD III  AGMA Baseline sr cr ~ 1.5.  -appreciate Nephrology assistance with patient care -assess now BMP  -continue CVVHD  -2 amps bicarb now for acidosis  -assess ABG now  Acute Metabolic Encephalopathy  Rule out anoxic injury with hypotension, bleeding  -follow neuro exam, moving all extremities -PAD protocol with precedex, fentanyl  -RASS Goal 0 -1   Acute Respiratory Failure  In setting of recent CAP, suspected aspiration, acute hemorrhage -PRVC 8cc/kg  -wean PEEP / FiO2 for sats >90% -daily SBT / WUA when more stable  -follow CXR intermittent  Chronic Diastolic CHF  EKG with Incomplete RBBB - does not appear new  Hx TAVR 12/2018 ECHO 2/27 with LVEF 55-60%, no WMA, Grade I DD, mild MR, s/p TAVR -volume removal per Nephrology  -ICU monitoring   DM II  -SSI, sensitive scale   Hypothyroidism  -continue synthroid  -follow up TSH as outpatient   Hx CVA, Frequent Falls  Was pending discharge to SNF at time of admit  -PT when able   Best practice:  Diet: NPO  Pain/Anxiety/Delirium protocol (if indicated): Precedex  VAP protocol (if indicated): In place  DVT prophylaxis: SCD's  GI prophylaxis: PPI  Glucose control: SSI  Mobility: BR  Code Status: Full Code Family Communication: Sister Joanne Chars) called for update 3/11 - no answer, message left for return call. Other "adopted" daughter [name] # [phone].   Disposition: ICU  Labs   CBC: Recent Labs  Lab 02/19/20 1345 02/19/20 2241 02/20/20 0255 02/20/20 0810 02/21/20 0559 02/21/20 1210 02/21/20 1658 02/22/20 0336 02/22/20 1239  WBC 11.0*  --  28.2*  --  22.2* 21.8* 23.2* 19.2*  --   NEUTROABS 8.3*  --   --   --   --   --   --   --   --   HGB 4.5*   < > 9.3*   < > 7.5* 7.2* 6.8* 8.3* 8.5*  HCT 14.7*    < > 27.6*   < > 23.2* 21.8* 20.2* 25.7* 26.8*  MCV 94.2  --  87.9  --  93.5 92.8 91.0 94.8  --   PLT 130*  --  126*  --  60* 59* 57* 49*  --    < > = values in this interval not displayed.    Basic Metabolic Panel: Recent Labs  Lab 02/16/20 0408 02/16/20 0408 02/17/20 0615 02/17/20 0615 02/18/20 0602 02/18/20 0602 02/19/20 0450 02/19/20 1345 02/20/20 0255 02/20/20 1051 02/20/20 1718 02/21/20 0230 02/21/20 1658 02/22/20 0336  NA 138   < > 140   < > 142   < > 143   < > 145 145 146* 145  --  142  K 4.7   < > 4.2   < > 3.9   < > 3.6   < >  5.0 4.7 5.0 4.9  --  6.2*  CL 106   < > 107   < > 106   < > 104   < > 110 112* 111 111  --  106  CO2 22   < > 24   < > 27   < > 31   < > 18* 18* 14* 13*  --  12*  GLUCOSE 82   < > 79   < > 214*   < > 292*   < > 134* 117* 67* 76  --  225*  BUN 103*   < > 94*   < > 78*   < > 60*   < > 53* 58* 62* 60*  --  63*  CREATININE 3.23*   < > 2.79*   < > 2.26*   < > 1.94*   < > 2.50* 2.93* 3.12* 3.43*  --  4.33*  CALCIUM 7.6*   < > 7.8*   < > 8.1*   < > 8.0*   < > 7.7* 7.4* 7.2* 7.1*  --  6.5*  MG  --   --   --   --   --   --   --   --   --   --   --   --  2.2 2.3  PHOS 6.4*  --  6.0*  --  4.5  --  3.6  --   --   --   --   --   --  9.9*   < > = values in this interval not displayed.   GFR: Estimated Creatinine Clearance: 11.5 mL/min (A) (by C-G formula based on SCr of 4.33 mg/dL (H)). Recent Labs  Lab 02/19/20 1345 02/19/20 1537 02/19/20 1546 02/19/20 1702 02/20/20 0255 02/20/20 0255 02/20/20 0309 02/20/20 1051 02/21/20 0559 02/21/20 1210 02/21/20 1658 02/22/20 0336  PROCALCITON  --  0.16  --   --  4.10  --   --   --   --   --   --   --   WBC   < >  --   --   --  28.2*   < >  --   --  22.2* 21.8* 23.2* 19.2*  LATICACIDVEN  --   --  10.3* 7.1*  --   --  5.1* 5.1*  --   --   --   --    < > = values in this interval not displayed.    Liver Function Tests: Recent Labs  Lab 02/19/20 1345 02/20/20 0255 02/20/20 1051 02/21/20 0230  02/22/20 0336  AST 740* >10,000* >10,000* 9,432*  --   ALT 780* 9,338* 8,321* 6,336*  --   ALKPHOS 82 113 115 182*  --   BILITOT 1.0 1.6* 1.8* 1.9*  --   PROT 3.5* 4.9* 4.6* 4.8*  --   ALBUMIN 1.6* 2.5* 2.4* 2.6* 2.4*   No results for input(s): LIPASE, AMYLASE in the last 168 hours. Recent Labs  Lab 02/20/20 0128  AMMONIA 65*    ABG    Component Value Date/Time   PHART 7.346 (L) 02/19/2020 1416   PCO2ART 32.8 02/19/2020 1416   PO2ART 357 (H) 02/19/2020 1416   HCO3 18.8 (L) 02/19/2020 1416   TCO2 29 01/10/2019 1344   ACIDBASEDEF 7.1 (H) 02/19/2020 1416   O2SAT 99.3 02/19/2020 1416     Coagulation Profile: Recent Labs  Lab 02/19/20 1345 02/19/20 1701 02/20/20 0255 02/20/20 1718  INR 1.8* 2.4* 2.2* 2.3*  Cardiac Enzymes: No results for input(s): CKTOTAL, CKMB, CKMBINDEX, TROPONINI in the last 168 hours.  HbA1C: Hgb A1c MFr Bld  Date/Time Value Ref Range Status  02/10/2020 02:24 AM 6.6 (H) 4.8 - 5.6 % Final    Comment:    (NOTE) Pre diabetes:          5.7%-6.4% Diabetes:              >6.4% Glycemic control for   <7.0% adults with diabetes   01/14/2019 04:15 AM 9.5 (H) 4.8 - 5.6 % Final    Comment:    (NOTE) Pre diabetes:          5.7%-6.4% Diabetes:              >6.4% Glycemic control for   <7.0% adults with diabetes     CBG: Recent Labs  Lab 02/21/20 2012 02/21/20 2337 02/22/20 0341 02/22/20 0833 02/22/20 1202  GLUCAP 159* 180* 199* 213* 164*     Critical care time: 34 minutes      Noe Gens, MSN, NP-C [facility] 02/22/2020, 1:09 PM   Please see [email or web address] for pager details.    PCCM attending:  74 year old female omitted for hemorrhagic shock, retroperitoneal bleed status post transfusion, now with renal failure on CVVHD multiple vasopressors.  BP (!) 110/41   Pulse (!) 49   Temp (!) 95.9 F (35.5 C)   Resp (!) 22   Ht 5\' 3"  (1.6 m)   Wt 81.5 kg   SpO2 100%   BMI 31.83 kg/m   General: Elderly  female intubated mechanical life support critically ill Lungs: Biomechanical ventilated breath sounds Heart: Regular rhythm S1-S2 Abdomen: Significantly distended protuberant tense  Labs: Reviewed Chest x-ray: Reviewed CVVHD started yesterday  Assessment: Acute hemorrhagic shock Retroperitoneal bleed And acute blood loss anemia Acute renal failure on CVVHD Acute metabolic encephalopathy secondary to above Acute hypoxemic respiratory failure secondary to above requiring mechanical ventilation.  Plan: Continue to monitor H&H Remains on CVVHD, appreciate nephrology input. Continue wean vasopressors to maintain mean arterial pressure greater than 65 mmHg Remains on full mechanical support Wean FiO2 to maintain SPO2 greater than 90% -Encephalopathy and mental status precludes liberation from vent at this time.  This patient is critically ill with multiple organ system failure; which, requires frequent high complexity decision making, assessment, support, evaluation, and titration of therapies. This was completed through the application of advanced monitoring technologies and extensive interpretation of multiple databases. During this encounter critical care time was devoted to patient care services described in this note for 31 minutes.  Garner Nash, DO [facility] 02/22/2020 7:33 PM

## 2020-02-22 NOTE — Progress Notes (Signed)
Daughter ([name]) updated via phone.  Patients sister [name] [name]) updated via phone as well.  She indicates that the patient has always stated she would not want prolonged aggressive care or to "live on machines". She states "[name] [name] is a DNR" and I talked to the nurse last night and she said she was a full code.  Reviewed the meaning of DNR and the patients sister states the family has talked about it and want her to be DNR.  We discussed that we will continue all supportive measures up to the point of CPR / ACLS in the event of arrest.  She is in agreement.   Noe Gens, MSN, NP-C [facility] 02/22/2020, 2:44 PM   Please see [email or web address] for pager details.

## 2020-02-22 NOTE — Progress Notes (Signed)
eLink Physician-Brief Progress Note Patient Name: [name] [name] DOB: 11-15-1946 MRN: [id number]   Date of Service  02/22/2020  HPI/Events of Note  Hyperkalemia - K+ = 6.2 and Creatinine = 4.33.   eICU Interventions  Will order: 1. Kayexalate 30 gm per tube now.  2. Please notify Nephrology of K+ result.      Intervention Category Major Interventions: Electrolyte abnormality - evaluation and management  Masiya Claassen Eugene 02/22/2020, 5:14 AM

## 2020-02-22 NOTE — Progress Notes (Signed)
Inpatient Diabetes Program Recommendations  AACE/ADA: New Consensus Statement on Inpatient Glycemic Control   Target Ranges:  Prepandial:   less than 140 mg/dL      Peak postprandial:   less than 180 mg/dL (1-2 hours)      Critically ill patients:  140 - 180 mg/dL   Results for RENESMAY, NESBITT (MRN [id number]) as of 02/22/2020 11:42  Ref. Range 02/21/2020 07:59 02/21/2020 12:37 02/21/2020 16:24 02/21/2020 20:12 02/21/2020 23:37 02/22/2020 03:41 02/22/2020 08:33  Glucose-Capillary Latest Ref Range: 70 - 99 mg/dL 102 (H) 143 (H) 192 (H) 159 (H) 180 (H) 199 (H) 213 (H)   Review of Glycemic Control  Current orders for Inpatient glycemic control: Novolog 0-9 units Q4H; Vital @ 50 ml/hr  Inpatient Diabetes Program Recommendations:   Insulin-Tube Feeding Coverage: Please consider ordering Novolog 3 units Q4H for tube feeding coverage. If tube feeding is stopped or held then Novolog tube feeding coverage should also be stopped or held.  Thanks, Barnie Alderman, RN, MSN, CDE Diabetes Coordinator Inpatient Diabetes Program [phone] (Team Pager from 8am to 5pm)

## 2020-02-22 NOTE — Progress Notes (Signed)
Nutrition Follow up  DOCUMENTATION CODES:   Not applicable  INTERVENTION:   Transition liquid MVI to B complex with Vitamin C  Continue tube feeding:  Vital High Protein @ 55 ml/hr via OGT (1320 ml)  Provides: 1320 kcals, 116 grams protein, 1104 ml free water.   NUTRITION DIAGNOSIS:   Increased nutrient needs related to acute illness as evidenced by estimated needs.  Ongoing   GOAL:   Patient will meet greater than or equal to 90% of their needs   Addressed via TF  MONITOR:   Diet advancement, Vent status, Skin, Weight trends, TF tolerance, Labs, I & O's  REASON FOR ASSESSMENT:   Ventilator    ASSESSMENT:   Patient with PMH significant for HTN, HLD, DM, CVA, chronic bronchitis, CKD III, RA, and severe aortic stenosis s/p TAVR. Presented to [facility] for CAP and AECOPD. Was scheduled to d/c on 3/8 but was found agonal prior to transport.   3/8- intubated, transferred to [facility]  Pt discussed during ICU rounds and with RN.   Failed SBT. Remains on pressors. Started CRRT this am. UOP minimal. Weight trending up. Started on Vital High Protein yesterday. Stopped by RN last night due to elevated K. Suspect improvement in K and Phos now that pt is on CRRT. TF restarted.   Admission weight: 67.6 kg  Current weight: 81.5 kg   Patient remains intubated on ventilator support MV: 9.1 L/min Temp (24hrs), Avg:97.5 F (36.4 C), Min:94.8 F (34.9 C), Max:98.4 F (36.9 C)   I/O: +10,842 ml since admit  UOP: 20 ml x 24 hrs   Drips: precedex, levophed, vasopressin  Medications: SS novolog, Vit B12, aranesp Labs: K 6.2 (H) BUN 63-trending up Cr 4.33- trending up corrected calcium 7.8 (L) Phosphorus 9.9 (H)   Diet Order:   Diet Order            Diet NPO time specified  Diet effective now              EDUCATION NEEDS:   Not appropriate for education at this time  Skin:  Skin Assessment: Reviewed RN Assessment  Last BM:  3/8  Height:   Ht Readings from Last 1  Encounters:  02/19/20 5\' 3"  (1.6 m)    Weight:   Wt Readings from Last 1 Encounters:  02/22/20 81.5 kg    BMI:  Body mass index is 31.83 kg/m.  Estimated Nutritional Needs:   Kcal:  1248 kcal  Protein:  100-120 grams  Fluid:  >/= 1.2 L/day   Mariana Single RD, LDN Clinical Nutrition Pager listed in [place]

## 2020-02-23 DIAGNOSIS — Z992 Dependence on renal dialysis: Secondary | ICD-10-CM

## 2020-02-23 LAB — RENAL FUNCTION PANEL
Albumin: 2.5 g/dL — ABNORMAL LOW (ref 3.5–5.0)
Albumin: 2.1 g/dL — ABNORMAL LOW (ref 3.5–5.0)
Anion gap: 26 — ABNORMAL HIGH (ref 5–15)
Anion gap: 26 — ABNORMAL HIGH (ref 5–15)
BUN: 26 mg/dL — ABNORMAL HIGH (ref 8–23)
BUN: 18 mg/dL (ref 8–23)
CO2: 18 mmol/L — ABNORMAL LOW (ref 22–32)
CO2: 15 mmol/L — ABNORMAL LOW (ref 22–32)
Calcium: 6.9 mg/dL — ABNORMAL LOW (ref 8.9–10.3)
Calcium: 6.5 mg/dL — ABNORMAL LOW (ref 8.9–10.3)
Chloride: 98 mmol/L (ref 98–111)
Chloride: 95 mmol/L — ABNORMAL LOW (ref 98–111)
Creatinine, Ser: 2.19 mg/dL — ABNORMAL HIGH (ref 0.44–1.00)
Creatinine, Ser: 1.82 mg/dL — ABNORMAL HIGH (ref 0.44–1.00)
GFR calc Af Amer: 25 mL/min — ABNORMAL LOW (ref >60)
GFR calc Af Amer: 31 mL/min — ABNORMAL LOW (ref >60)
GFR calc non Af Amer: 21 mL/min — ABNORMAL LOW (ref >60)
GFR calc non Af Amer: 27 mL/min — ABNORMAL LOW (ref >60)
Glucose, Bld: 56 mg/dL — ABNORMAL LOW (ref 70–99)
Glucose, Bld: 122 mg/dL — ABNORMAL HIGH (ref 70–99)
Phosphorus: 5.6 mg/dL — ABNORMAL HIGH (ref 2.5–4.6)
Phosphorus: 5.8 mg/dL — ABNORMAL HIGH (ref 2.5–4.6)
Potassium: 5.4 mmol/L — ABNORMAL HIGH (ref 3.5–5.1)
Potassium: 5.2 mmol/L — ABNORMAL HIGH (ref 3.5–5.1)
Sodium: 142 mmol/L (ref 135–145)
Sodium: 136 mmol/L (ref 135–145)

## 2020-02-23 LAB — CBC
HCT: 23.3 % — ABNORMAL LOW (ref 36.0–46.0)
HCT: 24.6 % — ABNORMAL LOW (ref 36.0–46.0)
Hemoglobin: 7.5 g/dL — ABNORMAL LOW (ref 12.0–15.0)
Hemoglobin: 7.7 g/dL — ABNORMAL LOW (ref 12.0–15.0)
MCH: 30.4 pg (ref 26.0–34.0)
MCH: 30.2 pg (ref 26.0–34.0)
MCHC: 32.2 g/dL (ref 30.0–36.0)
MCHC: 31.3 g/dL (ref 30.0–36.0)
MCV: 94.3 fL (ref 80.0–100.0)
MCV: 96.5 fL (ref 80.0–100.0)
Platelets: 36 10*3/uL — ABNORMAL LOW (ref 150–400)
Platelets: 33 10*3/uL — ABNORMAL LOW (ref 150–400)
RBC: 2.47 MIL/uL — ABNORMAL LOW (ref 3.87–5.11)
RBC: 2.55 MIL/uL — ABNORMAL LOW (ref 3.87–5.11)
RDW: 15.2 % (ref 11.5–15.5)
RDW: 15.7 % — ABNORMAL HIGH (ref 11.5–15.5)
WBC: 23.8 10*3/uL — ABNORMAL HIGH (ref 4.0–10.5)
WBC: 22.1 10*3/uL — ABNORMAL HIGH (ref 4.0–10.5)
nRBC: 6.2 % — ABNORMAL HIGH (ref 0.0–0.2)
nRBC: 10.8 % — ABNORMAL HIGH (ref 0.0–0.2)

## 2020-02-23 LAB — POCT I-STAT 7, (LYTES, BLD GAS, ICA,H+H)
Acid-base deficit: 6 mmol/L — ABNORMAL HIGH (ref 0.0–2.0)
Bicarbonate: 18.8 mmol/L — ABNORMAL LOW (ref 20.0–28.0)
Calcium, Ion: 0.84 mmol/L — CL (ref 1.15–1.40)
HCT: 20 % — ABNORMAL LOW (ref 36.0–46.0)
Hemoglobin: 6.8 g/dL — CL (ref 12.0–15.0)
O2 Saturation: 93 %
Patient temperature: 36.7
Potassium: 5 mmol/L (ref 3.5–5.1)
Sodium: 136 mmol/L (ref 135–145)
TCO2: 20 mmol/L — ABNORMAL LOW (ref 22–32)
pCO2 arterial: 32.7 mmHg (ref 32.0–48.0)
pH, Arterial: 7.366 (ref 7.350–7.450)
pO2, Arterial: 68 mmHg — ABNORMAL LOW (ref 83.0–108.0)

## 2020-02-23 LAB — TYPE AND SCREEN
ABO/RH(D): O POS
Antibody Screen: NEGATIVE

## 2020-02-23 LAB — PROTIME-INR
INR: 3 — ABNORMAL HIGH (ref 0.8–1.2)
Prothrombin Time: 30.7 seconds — ABNORMAL HIGH (ref 11.4–15.2)

## 2020-02-23 LAB — GLUCOSE, CAPILLARY
Glucose-Capillary: 119 mg/dL — ABNORMAL HIGH (ref 70–99)
Glucose-Capillary: 121 mg/dL — ABNORMAL HIGH (ref 70–99)
Glucose-Capillary: 125 mg/dL — ABNORMAL HIGH (ref 70–99)
Glucose-Capillary: 14 mg/dL — CL (ref 70–99)
Glucose-Capillary: 146 mg/dL — ABNORMAL HIGH (ref 70–99)
Glucose-Capillary: 251 mg/dL — ABNORMAL HIGH (ref 70–99)
Glucose-Capillary: 27 mg/dL — CL (ref 70–99)
Glucose-Capillary: 46 mg/dL — ABNORMAL LOW (ref 70–99)
Glucose-Capillary: 72 mg/dL (ref 70–99)

## 2020-02-23 LAB — MAGNESIUM: Magnesium: 2.3 mg/dL (ref 1.7–2.4)

## 2020-02-23 LAB — EXPECTORATED SPUTUM ASSESSMENT W GRAM STAIN, RFLX TO RESP C

## 2020-02-23 LAB — PATHOLOGIST SMEAR REVIEW

## 2020-02-23 MED ORDER — DEXTROSE 50 % IV SOLN: 50.0000 mL | Freq: Once | INTRAVENOUS | Status: AC

## 2020-02-23 MED ORDER — ACETAMINOPHEN 325 MG PO TABS: 650.0000 mg | ORAL_TABLET | Freq: Four times a day (QID) | ORAL | Status: DC | PRN

## 2020-02-23 MED ORDER — GLYCOPYRROLATE 1 MG PO TABS
1.0000 mg | ORAL_TABLET | ORAL | Status: DC | PRN
  Filled 2020-02-23: qty 1

## 2020-02-23 MED ORDER — DEXTROSE 50 % IV SOLN
INTRAVENOUS | Status: AC
  Administered 2020-02-23: 25 g
  Filled 2020-02-23: qty 50

## 2020-02-23 MED ORDER — POLYVINYL ALCOHOL 1.4 % OP SOLN
1.0000 [drp] | Freq: Four times a day (QID) | OPHTHALMIC | Status: DC | PRN
  Filled 2020-02-23: qty 15

## 2020-02-23 MED ORDER — ACETAMINOPHEN 650 MG RE SUPP: 650.0000 mg | Freq: Four times a day (QID) | RECTAL | Status: DC | PRN

## 2020-02-23 MED ORDER — ONDANSETRON 4 MG PO TBDP
4.0000 mg | ORAL_TABLET | Freq: Four times a day (QID) | ORAL | Status: DC | PRN
  Filled 2020-02-23: qty 1

## 2020-02-23 MED ORDER — GLYCOPYRROLATE 0.2 MG/ML IJ SOLN: 0.2000 mg | INTRAMUSCULAR | Status: DC | PRN

## 2020-02-23 MED ORDER — INSULIN ASPART 100 UNIT/ML ~~LOC~~ SOLN: 0.0000 [IU] | SUBCUTANEOUS | Status: DC

## 2020-02-23 MED ORDER — PRISMASOL BGK 0/2.5 32-2.5 MEQ/L IV SOLN
INTRAVENOUS | Status: DC
  Filled 2020-02-23 (×8): qty 5000

## 2020-02-23 MED ORDER — DEXTROSE 10 % IV SOLN: INTRAVENOUS | Status: DC

## 2020-02-23 MED ORDER — DEXTROSE 50 % IV SOLN
INTRAVENOUS | Status: AC
  Administered 2020-02-23: 50 mL
  Filled 2020-02-23: qty 50

## 2020-02-23 MED ORDER — ONDANSETRON HCL 4 MG/2ML IJ SOLN: 4.0000 mg | Freq: Four times a day (QID) | INTRAMUSCULAR | Status: DC | PRN

## 2020-02-23 MED ORDER — DIPHENHYDRAMINE HCL 50 MG/ML IJ SOLN: 25.0000 mg | INTRAMUSCULAR | Status: DC | PRN

## 2020-02-24 LAB — CULTURE, BLOOD (ROUTINE X 2)
Culture: NO GROWTH
Culture: NO GROWTH
Special Requests: ADEQUATE

## 2020-02-26 MED FILL — Medication: Qty: 1 | Status: AC

## 2020-03-14 NOTE — Progress Notes (Signed)
Time of death 17.45. pronounced by RN Reeves Musick and RN [place].

## 2020-03-14 NOTE — Progress Notes (Signed)
eLink Physician-Brief Progress Note Patient Name: [name] [name] DOB: 1946/07/01 MRN: [id number]   Date of Service  03/04/2020  HPI/Events of Note  Multiple episodes of hypoglycemia (blood glucose = 63 and 46). Given D50.   eICU Interventions  Will order: 1. D10W to tun IV at 30 mL/hour.      Intervention Category Major Interventions: Other:  Lysle Dingwall 03-04-20, 4:43 AM

## 2020-03-14 NOTE — Progress Notes (Addendum)
NAME:  [name] [name], MRN:  [id number], DOB:  Jan 08, 1946, LOS: 24 ADMISSION DATE:  02/01/2020, CONSULTATION DATE:  3/8 REFERRING MD:  Dr. Wynetta Emery, TRH, CHIEF COMPLAINT:  Shock    Brief History   74 y/o F who was admitted 2/26 with reports of weakness, falls, cough and SOB.  The patient was treated as CAP & AECOPD with improvement and was discharged 3/8 am to a SNF but was found pale, agonal with SBP in the 60's around 1pm, intubated / transferred to ER for further evaluation. CT ABD demonstrated a large left retroperitoneal bleed, Hgb 4.5.  Massive transfusion protocol initiated, pt transferred to [facility].   Past Medical History  Severe Aortic Stenosis s/p TAVR 01/10/19, completed 6 months DAPT CVA HTN HLD CKD III  Rheumatoid Arthritis  Hypothyroidism  DM II  COPD / Chronic Bronchitis   Significant Hospital Events   2/26 Admit  3/08 Discharged to SNF, found agonal prior to transport. RP bleed with Hgb 4.5.  3/09 Remains in shock, coagulopathic.  Transfused PRBC overnight.  3/10 Tx 1 unit overnight, failed PSV after 1 hour  3/11 CVVHD initiated   Consults:  PCCM   Procedures:  ETT 3/8 >>  R IJ TLC 3/8 >>  L IJ HD 3/9 >>  Significant Diagnostic Tests:   CT Head 3/8 >> no evidence of acute intracranial injury, stable chronic findings   CT Chest w/o 3/8 >> bilateral bronchitis with mucous plugging in the bilateral upper lobe airways, tracheobronchial aspiration, small left sided effusion, persistent ground glass opacities in the R lung, possible tracheobronchomalacia , stable mild cardiomegaly with four-vessel coronary calcification, remote AV repair   CT ABD/Pelvis 3/8 >> interval development of a large left retroperitoneal hematoma extending into the left iliopsoas and the pelvis. Mild circumferential rectal wall thickening, potentially representing a mild uncomplicated nonspecific proctitis, interval development of moderate perinephric inflammatory changes, mild sigmoid  diverticulosis, no freeintraperitoneal air or abscess, small amt of dense material in the dependent gastric fundus  Micro Data:  RVP 2/28 >> rhinovirus detected COVID 3/03 >> negative  BCx2 3/8 >>  MRSA PCR 3/8 >> negative   Antimicrobials:  Cefepime 3/9 (empiric pulmonary) 3/9 >>   Interim history/subjective:  RN reports issues with hypoglycemia and hypotension overnight.   Objective   Blood pressure (!) 80/48, pulse 64, temperature 98.4 F (36.9 C), resp. rate (!) 23, height 5\' 3"  (1.6 m), weight 81.8 kg, SpO2 100 %. CVP:  [6 mmHg-14 mmHg] 12 mmHg  Vent Mode: PRVC FiO2 (%):  [40 %] 40 % Set Rate:  [16 bmp] 16 bmp Vt Set:  [420 mL] 420 mL PEEP:  [5 cmH20] 5 cmH20 Plateau Pressure:  [28 cmH20-33 cmH20] 31 cmH20   Intake/Output Summary (Last 24 hours) at 03/17/2020 1034 Last data filed at 03-17-2020 1007 Gross per 24 hour  Intake 2420.14 ml  Output 2169 ml  Net 251.14 ml   Filed Weights   02/21/20 0500 02/22/20 0459 03-17-20 0500  Weight: 82.7 kg 81.5 kg 81.8 kg    Examination: General: Chronically ill appearing elderly female on mechanical ventilation, in NAD HEENT: ETT, MM pink/moist, PERRL,  Neuro: Unresponsive on low dose fentanyl CV: s1s2 regular rate and rhythm, no murmur, rubs, or gallops,  PULM:  Clear to ascultation bilaterally, no increased work of breathing GI: soft, bowel sounds active in all 4 quadrants, non-tender, non-distended, TF discontinued due to significant abdominal distention  Extremities: warm/dry, 2+ generalized edema  Skin: no rashes or lesions  Resolved  Hospital Problem list      Assessment & Plan:   Hemorrhagic Shock secondary to Retroperitoneal Bleed Acute Blood Loss Anemia  Coagulopathy  -CT ABD/Pelvis 3/8 revealed interval development of a large left retroperitoneal hematoma extending into the left iliopsoas and the pelvis P: CBC 7.5, INR 3.0 as of 3/12 Hoal greater than 7 Transfuse per protocol  May need IR evaluation for  possible embolization   Shock Liver  -In setting of hemorrhage, hypotension  P: LFT slowly downtrending  Supportive care  Avoid hepatotoxins  Hold home Zocor and tylenol   AKI on CKD III  AGMA Baseline sr cr ~ 1.5 P: Nephrology following appreciate assistance  CRRT started 3/11 Trend Bmet  Follow volume status  Follow renal function / urine output Trend Bmet Avoid nephrotoxins, ensure adequate renal perfusion  IV hydration Continue bicarb drip   Acute Metabolic Encephalopathy  -Concern for anoxic injury with hypotension, bleeding  -Head CT negative 3/8 P: Follow neuro exam, remains unresponsive  PAD protocol for sedation RASS gaol 0 to -1  Acute Respiratory Failure  -In setting of recent CAP, suspected aspiration, acute hemorrhage P: Continue ventilator support with lung protective strategies  Wean PEEP and FiO2 for sats greater than 90%. Head of bed elevated 30 degrees. Plateau pressures less than 30 cm H20.  Follow intermittent chest x-ray and ABG.   SAT/SBT as tolerated, mentation preclude extubation  Ensure adequate pulmonary hygiene  Follow cultures  VAP bundle in place  PAD protocol  Chronic Diastolic CHF  EKG with Incomplete RBBB - does not appear new  Hx TAVR 12/2018 -ECHO 2/27 with LVEF 55-60%, no WMA, Grade I DD, mild MR, s/p TAVR P: Volume removal per nephrology  ICU monitoring  Strict intake and output  Monitor volume status   DM II  P: SSI, decreased to very sensitive scale  CBC checks q4  Hypothyroidism  P: Continue synthroid  Will need to follow up TSH as outpatient   Hx CVA, Frequent Falls  -Was pending discharge to SNF at time of admit  P: PT/OT when able    Best practice:  Diet: NPO  Pain/Anxiety/Delirium protocol (if indicated): Precedex  VAP protocol (if indicated): In place  DVT prophylaxis: SCD's  GI prophylaxis: PPI  Glucose control: SSI  Mobility: BR  Code Status: Full Code Family Communication: Sister Joanne Chars)  called for update 3/12  Disposition: ICU  Labs   CBC: Recent Labs  Lab 02/19/20 1345 02/19/20 2241 02/21/20 1658 02/21/20 1658 02/22/20 0336 02/22/20 1239 02/22/20 1436 02/22/20 1521 02/22/20 2204 2020/03/02 0339 03/02/20 0343  WBC 11.0*   < > 23.2*  --  19.2*  --  20.6*  --  21.6*  --  23.8*  NEUTROABS 8.3*  --   --   --   --   --   --   --   --   --   --   HGB 4.5*   < > 6.8*   < > 8.3*   < > 8.3* 8.5* 8.4* 6.8* 7.5*  HCT 14.7*   < > 20.2*   < > 25.7*   < > 25.2* 25.0* 26.0* 20.0* 23.3*  MCV 94.2   < > 91.0  --  94.8  --  91.6  --  92.5  --  94.3  PLT 130*   < > 57*  --  49*  --  41*  --  42*  --  36*   < > = values in this interval not  displayed.    Basic Metabolic Panel: Recent Labs  Lab 02/18/20 0602 02/18/20 0602 02/19/20 0450 02/19/20 1345 02/20/20 1718 02/20/20 1718 02/21/20 0230 02/21/20 0230 02/21/20 1658 02/22/20 0336 02/22/20 1432 02/22/20 1521 02/29/20 0300 29-Feb-2020 0339  NA 142   < > 143   < > 146*   < > 145   < >  --  142 145 141 142 136  K 3.9   < > 3.6   < > 5.0   < > 4.9   < >  --  6.2* 5.3* 5.1 5.4* 5.0  CL 106   < > 104   < > 111  --  111  --   --  106 104  --  98  --   CO2 27   < > 31   < > 14*  --  13*  --   --  12* 20*  --  18*  --   GLUCOSE 214*   < > 292*   < > 67*  --  76  --   --  225* 148*  --  56*  --   BUN 78*   < > 60*   < > 62*  --  60*  --   --  63* 49*  --  26*  --   CREATININE 2.26*   < > 1.94*   < > 3.12*  --  3.43*  --   --  4.33* 3.37*  --  2.19*  --   CALCIUM 8.1*   < > 8.0*   < > 7.2*  --  7.1*  --   --  6.5* 6.4*  --  6.9*  --   MG  --   --   --   --   --   --   --   --  2.2 2.3 2.2  --  2.3  --   PHOS 4.5  --  3.6  --   --   --   --   --   --  9.9* 7.7*  --  5.6*  --    < > = values in this interval not displayed.   GFR: Estimated Creatinine Clearance: 22.8 mL/min (A) (by C-G formula based on SCr of 2.19 mg/dL (H)). Recent Labs  Lab 02/19/20 1345 02/19/20 1537 02/19/20 1546 02/19/20 1702 02/20/20 0255  02/20/20 0309 02/20/20 1051 02/21/20 0559 02/22/20 0336 02/22/20 1436 02/22/20 2204 02-29-2020 0343  PROCALCITON  --  0.16  --   --  4.10  --   --   --   --   --   --   --   WBC   < >  --   --   --  28.2*  --   --    < > 19.2* 20.6* 21.6* 23.8*  LATICACIDVEN  --   --  10.3* 7.1*  --  5.1* 5.1*  --   --   --   --   --    < > = values in this interval not displayed.    Liver Function Tests: Recent Labs  Lab 02/19/20 1345 02/19/20 1345 02/20/20 0255 02/20/20 0255 02/20/20 1051 02/21/20 0230 02/22/20 0336 02/22/20 1432 02-29-20 0300  AST 740*  --  >10,000*  --  >10,000* 9,432*  --   --   --   ALT 780*  --  9,338*  --  8,321* 6,336*  --   --   --   ALKPHOS 82  --  113  --  115 182*  --   --   --   BILITOT 1.0  --  1.6*  --  1.8* 1.9*  --   --   --   PROT 3.5*  --  4.9*  --  4.6* 4.8*  --   --   --   ALBUMIN 1.6*   < > 2.5*   < > 2.4* 2.6* 2.4* 2.5* 2.5*   < > = values in this interval not displayed.   No results for input(s): LIPASE, AMYLASE in the last 168 hours. Recent Labs  Lab 02/20/20 0128  AMMONIA 65*    ABG    Component Value Date/Time   PHART 7.366 03-12-20 0339   PCO2ART 32.7 03-12-2020 0339   PO2ART 68.0 (L) 03-12-2020 0339   HCO3 18.8 (L) 03-12-20 0339   TCO2 20 (L) 2020/03/12 0339   ACIDBASEDEF 6.0 (H) March 12, 2020 0339   O2SAT 93.0 03/12/20 0339     Coagulation Profile: Recent Labs  Lab 02/19/20 1701 02/20/20 0255 02/20/20 1718 02/22/20 1412 03/12/2020 0300  INR 2.4* 2.2* 2.3* 2.9* 3.0*    Cardiac Enzymes: No results for input(s): CKTOTAL, CKMB, CKMBINDEX, TROPONINI in the last 168 hours.  HbA1C: Hgb A1c MFr Bld  Date/Time Value Ref Range Status  02/10/2020 02:24 AM 6.6 (H) 4.8 - 5.6 % Final    Comment:    (NOTE) Pre diabetes:          5.7%-6.4% Diabetes:              >6.4% Glycemic control for   <7.0% adults with diabetes   01/14/2019 04:15 AM 9.5 (H) 4.8 - 5.6 % Final    Comment:    (NOTE) Pre diabetes:           5.7%-6.4% Diabetes:              >6.4% Glycemic control for   <7.0% adults with diabetes     CBG: Recent Labs  Lab March 12, 2020 0001 Mar 12, 2020 0305 03-12-2020 0332 March 12, 2020 0536 03-12-20 0800  GLUCAP 251* 46* 146* 72 121*     Critical care time: 34 minutes     Performed by: Johnsie Cancel  Total critical care time: 40 minutes  Critical care time was exclusive of separately billable procedures and treating other patients.  Critical care was necessary to treat or prevent imminent or life-threatening deterioration.  Critical care was time spent personally by me on the following activities: development of treatment plan with patient and/or surrogate as well as nursing, discussions with consultants, evaluation of patient's response to treatment, examination of patient, obtaining history from patient or surrogate, ordering and performing treatments and interventions, ordering and review of laboratory studies, ordering and review of radiographic studies, pulse oximetry and re-evaluation of patient's condition.   Johnsie Cancel, NP-C [facility] Contact / Pager information can be found on Amion  Mar 12, 2020, 10:54 AM   Pulmonary critical care attending:  74 year old admitted for retroperitoneal bleed, massive transfusion protocol, multiple organ failure.  Intubated on mechanical life support critically ill.  BP (!) 76/50   Pulse 64   Temp 97.9 F (36.6 C)   Resp (!) 22   Ht 5\' 3"  (1.6 m)   Wt 81.8 kg   SpO2 100%   BMI 31.95 kg/m   General: Elderly female intubated on mechanical life support critically ill Neck: HD catheter in place Heart: Regular rhythm S1-S2 Lungs: Clear to auscultation, bilateral mechanically ventilated breath sounds Abdomen:  Severely distended  Assessment: Hemorrhagic shock, retroperitoneal bleed, acute blood loss anemia Shock liver AKI on CKD 3 Acute metabolic encephalopathy Acute hypoxemic respiratory failure requiring  intubation mechanical ventilation secondary to above  Plan: Hemoglobin has somewhat been stable, had only required 1 additional transfusion Remains on multiple vasopressors Multiorgan failure, renal failure with CVVHD continued by nephrology. At this point family has agreed for no further escalation of care She is a DNR with no chest compressions If she does not survive the family has set limits for 14 days of support. I do not believe that she will survive this long. I believe the patient will pass on maximal support within the coming hours. The patient's family have been notified of this.  This patient is critically ill with multiple organ system failure; which, requires frequent high complexity decision making, assessment, support, evaluation, and titration of therapies. This was completed through the application of advanced monitoring technologies and extensive interpretation of multiple databases. During this encounter critical care time was devoted to patient care services described in this note for 32 minutes.  Garner Nash, DO [facility] 2020/03/06 4:07 PM

## 2020-03-14 NOTE — Progress Notes (Signed)
Dr. Moshe Cipro informed of patient's passing.

## 2020-03-14 NOTE — Progress Notes (Signed)
Subjective:    Remains pressor dependent- very little UOP - remains pretty acidotic in spite of post filter bicarb replacement - no CRRT mechanical issues  Objective Vital signs in last 24 hours: Vitals:   03-16-2020 0400 03/16/20 0500 2020/03/16 0600 March 16, 2020 0700  BP: (!) 91/36 (!) 93/47 106/62 (!) 80/50  Pulse: (!) 59 (!) 57 62 60  Resp: (!) 22 (!) 22 (!) 22 (!) 21  Temp: 98.1 F (36.7 C) 98.4 F (36.9 C) 98.6 F (37 C) 98.1 F (36.7 C)  TempSrc: Rectal     SpO2: 99% 100% 99% 99%  Weight:  81.8 kg    Height:       Weight change: 0.3 kg  Intake/Output Summary (Last 24 hours) at 03/16/2020 0747 Last data filed at 03/16/2020 0700 Gross per 24 hour  Intake 2148.91 ml  Output 1758 ml  Net 390.91 ml    Assessment/ Plan: Pt is a 74 y.o. yo female with COPD, RA, AS s/p TAVR in past, CKD who was admitted to [facility] with COPD exac/CAP complicated by AKI-  Was improving, then on 01/27/2020  had acute decompensation with hypotension and dec MS-  Found to have hemorrhagic shock from retroperitoneal bleed Assessment/Plan: 1. Renal-  Recent resolving AKI-  crt 1.94 on 3/8 (baseline in mid 1's)-  Then events of hemodynamic instability- recurrent AKI.  CRRT started on 3/11 via vascath 2. HTN/vol-  Recent hemorrhagic shock-  Still on pressors-  Exam indicates edema- some volume overload -  Will try a little UF 3. Anemia-  Hemorrhagic shock- transfusion PRN-  Platelets now down as well.  I have added ESA but likely wont make much difference in the short term  4. Metabolic acidosis-  Improved but not great-  Continue post filter bicarb 5. Transaminitis-  Due to shock- improving slowly  6.  Potassium -  Will change dialysate to 2 K    [facility]    Labs: Basic Metabolic Panel: Recent Labs  Lab 02/22/20 0336 02/22/20 0336 02/22/20 1432 02/22/20 1432 02/22/20 1521 March 16, 2020 0300 March 16, 2020 0339  NA 142   < > 145   < > 141 142 136  K 6.2*   < > 5.3*   < > 5.1 5.4* 5.0  CL 106  --  104   --   --  98  --   CO2 12*  --  20*  --   --  18*  --   GLUCOSE 225*  --  148*  --   --  56*  --   BUN 63*  --  49*  --   --  26*  --   CREATININE 4.33*  --  3.37*  --   --  2.19*  --   CALCIUM 6.5*  --  6.4*  --   --  6.9*  --   PHOS 9.9*  --  7.7*  --   --  5.6*  --    < > = values in this interval not displayed.   Liver Function Tests: Recent Labs  Lab 02/20/20 0255 02/20/20 0255 02/20/20 1051 02/20/20 1051 02/21/20 0230 02/21/20 0230 02/22/20 0336 02/22/20 1432 March 16, 2020 0300  AST >10,000*  --  >10,000*  --  9,432*  --   --   --   --   ALT 9,338*  --  8,321*  --  6,336*  --   --   --   --   ALKPHOS 113  --  115  --  182*  --   --   --   --   BILITOT 1.6*  --  1.8*  --  1.9*  --   --   --   --   PROT 4.9*  --  4.6*  --  4.8*  --   --   --   --   ALBUMIN 2.5*   < > 2.4*   < > 2.6*   < > 2.4* 2.5* 2.5*   < > = values in this interval not displayed.   No results for input(s): LIPASE, AMYLASE in the last 168 hours. Recent Labs  Lab 02/20/20 0128  AMMONIA 65*   CBC: Recent Labs  Lab 02/19/20 1345 02/19/20 2241 02/21/20 1658 02/21/20 1658 02/22/20 0336 02/22/20 1239 02/22/20 1436 02/22/20 1521 02/22/20 2204 2020/03/11 0339 11-Mar-2020 0343  WBC 11.0*   < > 23.2*   < > 19.2*  --  20.6*  --  21.6*  --  23.8*  NEUTROABS 8.3*  --   --   --   --   --   --   --   --   --   --   HGB 4.5*   < > 6.8*   < > 8.3*   < > 8.3*   < > 8.4* 6.8* 7.5*  HCT 14.7*   < > 20.2*   < > 25.7*   < > 25.2*   < > 26.0* 20.0* 23.3*  MCV 94.2   < > 91.0  --  94.8  --  91.6  --  92.5  --  94.3  PLT 130*   < > 57*   < > 49*  --  41*  --  42*  --  36*   < > = values in this interval not displayed.   Cardiac Enzymes: No results for input(s): CKTOTAL, CKMB, CKMBINDEX, TROPONINI in the last 168 hours. CBG: Recent Labs  Lab 02/22/20 2234 2020-03-11 0001 March 11, 2020 0305 11-Mar-2020 0332 March 11, 2020 0536  GLUCAP 176* 251* 46* 146* 72    Iron Studies: No results for input(s): IRON, TIBC, TRANSFERRIN,  FERRITIN in the last 72 hours. Studies/Results: No results found. Medications: Infusions: .  prismasol BGK 4/2.5 400 mL/hr at 02/22/20 2230  . sodium chloride Stopped (02/22/20 2308)  . ceFEPime (MAXIPIME) IV Stopped (02/22/20 2253)  . dextrose 30 mL/hr at 2020-03-11 0700  . fentaNYL infusion INTRAVENOUS 25 mcg/hr (11-Mar-2020 0700)  . norepinephrine (LEVOPHED) Adult infusion 22 mcg/min (Mar 11, 2020 0700)  . prismasol BGK 4/2.5 1,500 mL/hr at 03-11-2020 0508  . sodium bicarbonate (isotonic) 1000 mL infusion 200 mL/hr at 2020-03-11 0336  . vasopressin (PITRESSIN) infusion - *FOR SHOCK* 0.03 Units/min (11-Mar-2020 0700)    Scheduled Medications: . sodium chloride   Intravenous Once  . arformoterol  15 mcg Nebulization BID  . B-complex with vitamin C  1 tablet Per Tube Daily  . budesonide (PULMICORT) nebulizer solution  0.5 mg Nebulization BID  . chlorhexidine gluconate (MEDLINE KIT)  15 mL Mouth Rinse BID  . Chlorhexidine Gluconate Cloth  6 each Topical Daily  . darbepoetin (ARANESP) injection - NON-DIALYSIS  100 mcg Subcutaneous Q Wed-1800  . feeding supplement (VITAL HIGH PROTEIN)  1,000 mL Per Tube Q24H  . insulin aspart  0-9 Units Subcutaneous Q4H  . levothyroxine  125 mcg Per Tube Daily  . mouth rinse  15 mL Mouth Rinse BID  . mouth rinse  15 mL Mouth Rinse 10 times per day  . pantoprazole (PROTONIX) IV  40 mg  Intravenous Q24H  . sodium chloride flush  10-40 mL Intracatheter Q12H  . sodium chloride flush  3 mL Intravenous Q12H  . timolol  1 drop Both Eyes BID  . vitamin B-12  1,000 mcg Per Tube Daily    have reviewed scheduled and prn medications.  Physical Exam: General: sedated on vent Heart: RRR Lungs: ant clear Abdomen: distended Extremities: 1+ pitting edema to dep areas Dialysis Access: left IJ vascath placed 3/9     03/10/20,7:47 AM  LOS: 14 days

## 2020-03-14 NOTE — Progress Notes (Signed)
[facility] called. Referral [phone]. Possible tissue and eye donation. Coordinator to respond. Bedside RN Emer notified.

## 2020-03-14 NOTE — Progress Notes (Signed)
277ml of fentanyl wasted in stericycle with RN Imagene Gurney.

## 2020-03-14 NOTE — Procedures (Signed)
Extubation Procedure Note  Patient Details:   Name: [name] [name] DOB: Jun 11, 1946 MRN: [id number]   Airway Documentation:    Vent end date: 03/14/20 Vent end time: 1731   Evaluation  O2 sats: stable throughout Complications: No apparent complications Patient did tolerate procedure well. Bilateral Breath Sounds: Rhonchi   No, pt could not speak post extubation.  Pt extubated to room air for compassionate withdrawal of life sustaining treatment per physician's order and family's wishes.  Family at bedside during extubation procedure.  Earney Navy 03/14/20, 5:32 PM

## 2020-03-14 NOTE — Death Summary Note (Addendum)
DEATH SUMMARY   Patient Details  Name: [name] [name] MRN: [id number] DOB: 22-Jan-1946  Admission/Discharge Information   Admit Date:  20-Feb-2020  Date of Death: Date of Death: 03/05/20  Time of Death: Time of Death: Mar 16, 1744  Length of Stay: Mar 08, 2023  Referring Physician: Redmond School, MD   Reason(s) for Hospitalization  74 y/o F who was admitted 02-19-2023 with reports of weakness, falls, cough and SOB.  The patient was treated as CAP & AECOPD with improvement and was discharged 3/8 am to a SNF but was found pale, agonal with SBP in the 60's around 1pm, intubated / transferred to ER for further evaluation. CT ABD demonstrated a large left retroperitoneal bleed, Hgb 4.5.  Massive transfusion protocol initiated, pt transferred to [facility].   Diagnoses  Preliminary cause of death: Hemorrhagic shock ([place]) Secondary Diagnoses (including complications and co-morbidities):  Principal Problem:   Acute exacerbation of chronic bronchitis (HCC) Active Problems:   Essential hypertension   Hyperlipidemia   History of gastric ulcer   Chronic diastolic CHF (congestive heart failure) (HCC)   CKD (chronic kidney disease) stage 3, GFR 30-59 ml/min   Severe aortic stenosis   Rheumatoid arthritis (HCC)   Hypothyroidism   S/P TAVR (transcatheter aortic valve replacement)   Acute hypoxemic respiratory failure (HCC)   Acute renal failure superimposed on stage 3b chronic kidney disease ([place])   Community acquired pneumonia   Wheezing   Acute respiratory disease   Hemodynamic instability   Shock circulatory (HCC)   Retroperitoneal bleeding   Brief Hospital Course (including significant findings, care, treatment, and services provided and events leading to death)  [name] [name] is a 74 y.o. year old female who medical history significant ofhypertension, hyperlipidemia, hypothyroidism, type 2 diabetes mellitus, CVA, chronic bronchitis, chronic kidney disease stage III, rheumatoid arthritis, and severe aortic  stenosis status post TAVR on 1/28/2020who presented to the ER with worsening shortness of breath, weakness.Patient states that she had increased weakness over the last 3 days and is also been wheezing and having shortness of breath. She had associated cough with clear sputum.She says she used her albuterol inhaler at home multiple times with no relief. She does have a nebulizer machine but it has been broken for about the past 2 months. She says earlier today she felt so weak when she tried to walk that she got dizzy andsustained a fall with minor hitto the head against a door but no obvious injuries. She did not have any loss of consciousness, focal deficits, blurring of vision. She reported she had been hypoglycemic this morning when she woke up and her blood glucose was 50. ED Course: Vital Signs reviewed on presentation, significant fortemperature 97.7, blood pressure 142/52, heart rate 81, saturation 93% on room air at rest, dropping to the 80s on ambulation. Labs reviewed, significant forsodium 138, potassium 3.2, BUN 23, creatinine 2.01, glucose 134, WBC 11.7, hemoglobin 11.7, hematocrit 36, platelets 366. SARS COVID-19 screen is negative. Urinalysis shows glucose, protein, negative for bacteria or WBCs. Imaging personally Reviewed,chest x-ray shows bilateral interstitial opacities, right worse than left with areas of subtle nodularity most prominent in the right upper lobe. Findings likely secondary to atypical viral pneumonia.  The patient was treated as CAP & AECOPD with improvement and was discharged 3/8 am to a SNF but was found pale, agonal with SBP in the 60's around 1pm, intubated / transferred to ER for further evaluation. CT ABD demonstrated a large left retroperitoneal bleed, Hgb 4.5.  Massive transfusion protocol initiated,  pt transferred to [facility] for acute blood loss anemia and hemorrhagic shock.  The patient was transferred to [facility] hospital. She required massive  transfusion protocol. She was placed on multiple vasopressors as well as CVVHD for volume removal and kidney failure.   She developed a severely distended abdomen.   Ongoing discussion about the patients wishes with family occurred. The decision was made palliative withdrawal of care.   The patient passed in the ICU    Pertinent Labs and Studies  Significant Diagnostic Studies CT ABDOMEN PELVIS WO CONTRAST  Result Date: 02/19/2020 CLINICAL DATA:  Abdominal distension. Sepsis. Low hemoglobin and elevated lactic acid. EXAM: CT ABDOMEN AND PELVIS WITHOUT CONTRAST TECHNIQUE: Multidetector CT imaging of the abdomen and pelvis was performed following the standard protocol without IV contrast. Automatic exposure control utilized. COMPARISON:  August 18, 2018 FINDINGS: Lower chest: Reported on concomitant CT chest examination. Hepatobiliary: No acute abnormality. Decompressed or surgically absent gallbladder. Normal hepatic size and smooth hepatic contours. Pancreas: Diffuse atrophy and mild fatty infiltration without acute pancreatitis. Spleen: Stable normal splenic size and contours. New retroperitoneal hematoma extending in the left paracolic gutter adjacent to the splenic margin. Adrenals/Urinary Tract: Normal adrenal glands. Redemonstration of horseshoe kidney. Interval development of moderate perinephric inflammatory change, likely medical renal disease or infection. No hydronephrosis. Nonobstructing renal calcifications that are probably atherosclerotic in etiology. Decompressed bilateral ureters. Foley catheter decompression of the urinary bladder. Stomach/Bowel: Small amount of dense material in the dependent gastric fundus, similar to the prior study, probably dense ingested contents. Appropriately positioned enteric tube. No free intraperitoneal air or perigastric inflammatory change. Decompressed bowel without bowel obstruction. A left lower abdominal quadrant sigmoid mild diverticulosis with  subtle adjacent inflammatory change without abscess or perforation, likely secondary to the adjacent hematoma rather than an acute diverticulitis. Mild circumferential rectal wall thickening. Small amount of 47 Hounsfield unit dependent hemorrhage layering in the presacral space. Vascular/Lymphatic: Interval development of a large left retroperitoneal hematoma measuring approximately 18 cm cranio caudally by 9 cm transversely by 13 cm anteroposteriorly with intramuscular extension in the left iliapsoas musculature which is asymmetrically enlarged, and slight extension along the pelvic sidewall into the dependent pelvis and contralateral pelvic side wall. Aorto bi-iliac calcified atherosclerosis without an abdominal aortic aneurysm. Reproductive: Hysterectomy. No adnexal cyst. Other: Mild abdominal wall edema and clustered foci of air likely secondary to therapeutic injections. Musculoskeletal: Diffuse bone demineralization. Remote lumbosacral posterior spinal stabilization and decompression. Anasarca below the waist. No apparent rib fracture. Critical Value/emergent results were called by telephone at the time of interpretation on 02/19/2020 at 5:30 pm to provider [facility] , who verbally acknowledged these results. IMPRESSION: 1. Interval development of a large left retroperitoneal hematoma extending into the left iliopsoas and the pelvis. 2. Mild circumferential rectal wall thickening, potentially representing a mild uncomplicated nonspecific proctitis. 3. Redemonstration of horseshoe kidney with interval development of moderate perinephric inflammatory changes, likely medical renal disease or acute urinary tract infection. 4. Mild sigmoid diverticulosis with adjacent mesenteric stranding likely secondary to the adjacent retroperitoneal hematoma rather than an acute diverticulitis. No free intraperitoneal air or abscess. 5. Mild abdominal wall anasarca and clustered foci of air, likely secondary to  therapeutic injections. 6. Small amount of dense material in the dependent gastric fundus, probably dense ingested contents. Appropriate enteric tube placement. Aortic Atherosclerosis (ICD10-I70.0). Electronically Signed   By: Revonda Humphrey   On: 02/19/2020 17:31   CT HEAD WO CONTRAST  Result Date: 02/19/2020 CLINICAL DATA:  Fall EXAM: CT  HEAD WITHOUT CONTRAST TECHNIQUE: Contiguous axial images were obtained from the base of the skull through the vertex without intravenous contrast. COMPARISON:  01/13/2019 FINDINGS: Brain: There is no acute intracranial hemorrhage, mass-effect, or edema. Gray-white differentiation is preserved. There is no extra-axial fluid collection. Prominence of the ventricles and sulci reflects stable minor parenchymal volume loss. Patchy hypoattenuation in the supratentorial white matter is nonspecific but probably reflects stable mild to moderate chronic microvascular ischemic changes. Vascular: There is atherosclerotic calcification at the skull base. Skull: Calvarium is unremarkable. Sinuses/Orbits: No acute finding. Other: None. IMPRESSION: No evidence of acute intracranial injury. Stable chronic findings detailed above. Electronically Signed   By: Macy Mis M.D.   On: 02/19/2020 16:47   CT CHEST WO CONTRAST  Result Date: 02/19/2020 CLINICAL DATA:  Chronic dyspnea of uncertain etiology. Ventilated patient admitted February 09, 2020 following 3 days of weakness, cough, dyspnea and falling spells. EXAM: CT CHEST WITHOUT CONTRAST TECHNIQUE: Multidetector CT imaging of the chest was performed following the standard protocol without IV contrast. Automatic exposure control utilized. COMPARISON:  February 10, 2020 FINDINGS: Cardiovascular: Stable mild cardiomegaly with four-vessel coronary calcification and remote aortic valvular replacement. Right internal jugular central vascular catheter, its tip in the right cardiac atrium. Trace pericardial fluid. Mediastinum/Nodes: An  endotracheal tube tip 1.8 cm above the carina. Small amount of aspirated material at the carina and right mainstem bronchus origin and distal lumen. A feeding tube catheter within the gastric lumen. Lungs/Pleura: Small calibers of the bilateral mainstem bronchi measuring 4.7 mm on the right and 5.4 mm on the left. Crescent shape of the distal trachea. Bilateral nonspecific bronchitis with mucous plugging in the bilateral upper lobe airways. Bibasilar subpleural consolidation that may be atelectatic. Small left 20 Hounsfield unit pleural effusion. Central pulmonary vascular congestion without overt pulmonary edema. Persistent ground-glass opacities in the right lung. Subpleural atelectasis in the left upper lobe anteriorly and posteriorly. Upper Abdomen: Reported with concomitant CT abdomen pelvis study. Musculoskeletal: Diffuse bone demineralization and mild to moderate skeletal degenerative changes. A non-STAT PRA report was generated for support device adjustment. IMPRESSION: 1. Bilateral bronchitis with mucous plugging in the bilateral upper lobe airways and tracheobronchial aspiration. Unenhanced CT chest recommended in 3 months to document resolution. 2. Small left-sided pleural effusion. 3. 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 4. Endotracheal tube tip 1.8 cm above the carina; a 1.7 cm retraction recommended. 5. 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 6. Stable mild cardiomegaly with four-vessel coronary calcification and remote aortic valvular replacement. 7. Aortic Atherosclerosis (ICD10-I70.0). Electronically Signed   By: Revonda Humphrey   On: 02/19/2020 17:08   CT CHEST WO CONTRAST  Result Date: 02/10/2020 CLINICAL DATA:  Cough, persistent COPD exacerbation. Chest x-ray with questionable abnormalities. EXAM: CT CHEST WITHOUT CONTRAST TECHNIQUE: Multidetector CT imaging of the chest was performed following the standard protocol without IV contrast.  COMPARISON:  Chest x-ray of 01/29/2020 and CT a chest abdomen and pelvis from 08/18/2018 FINDINGS: Cardiovascular: Calcified atherosclerotic changes and three-vessel coronary artery disease with evidence of prior trans arterial aortic valve replacement. Heart size mildly enlarged without pericardial effusion. Limited assessment of vascular structures due to lack of intravenous contrast. Central pulmonary vasculature mildly dilated at 2.7 cm. Mediastinum/Nodes: Mildly enlarged AP window lymph node 11 mm not changed since 2019. Scattered small lymph nodes throughout the chest none with pathologic enlargement. Hilar structures not well evaluated. No thoracic inlet adenopathy. No thoracic inlet adenopathy. Esophagus with mild circumferential thickening distally. Lungs/Pleura: Patchy opacities at  the right lung apex with surrounding ground-glass and peripheral predominance. Some of these areas show small central solid-appearing nodules, largest measuring 4 mm (image 55, series 5) process appears confined to the right upper lobe. Airways are patent. Some distortion of the right hilum and mild narrowing of the bronchus intermedius is unchanged and may reflect mild bronchomalacia. Scattered areas of scarring in the lingula are stable. Upper Abdomen: Incidental imaging of upper abdominal contents showing no signs of acute abnormality. Musculoskeletal: No acute bone finding or destructive bone process. IMPRESSION: 1. Patchy opacities at the right lung apex with surrounding ground-glass and peripheral predominance. Process appears confined to mainly to the right upper lobe. This would be atypical for metastases. Infection with viral or atypical process is considered. Correlation with blood cultures may be helpful to exclude hematogenous process in this patient with aortic valve replacement. 2. Stable mildly enlarged AP window lymph node. Attention on follow-up 3. Mild circumferential thickening of the distal esophagus may  reflect esophagitis. 4. Calcified atherosclerotic changes and three-vessel coronary artery disease with evidence of prior trans arterial aortic valve replacement. Aortic Atherosclerosis (ICD10-I70.0). Electronically Signed   By: Zetta Bills M.D.   On: 02/10/2020 17:58   US RENAL  Result Date: 02/12/2020 CLINICAL DATA:  Acute renal failure superimposed upon chronic kidney disease, diabetes mellitus, hypertension, hyperlipidemia, horseshoe kidney EXAM: RENAL / URINARY TRACT ULTRASOUND COMPLETE COMPARISON:  CT abdomen 08/18/2018 FINDINGS: Right Kidney: Renal measurements: 14.4 x 4.9 x 6.7 cm = volume: 250 mL. Extremely poorly visualized due to cortical thinning, increased cortical echogenicity, and body habitus. No gross evidence of mass or hydronephrosis are identified on limited assessment. Left Kidney: Renal measurements: 10.0 x 4.1 x 4.9 cm = volume: 106 mL. Extremely poorly visualized due to cortical thinning, increased cortical echogenicity, and body habitus. No gross evidence of mass or hydronephrosis on limited assessment. Bladder: Decompressed by catheter, unable to evaluate Other: N/A IMPRESSION: Severe limitations of exam secondary to body habitus, bowel gas, and horseshoe anatomy. There appears to be cortical thinning and increased cortical echogenicity of the renal moieties bilaterally without gross evidence of mass or hydronephrosis on limited assessment. Electronically Signed   By: Lavonia Dana M.D.   On: 02/12/2020 14:59   DG CHEST PORT 1 VIEW  Result Date: 02/21/2020 CLINICAL DATA:  ET tube, respiratory failure EXAM: PORTABLE CHEST 1 VIEW COMPARISON:  02/20/2020 FINDINGS: Support devices are stable. Prior TAVR. Heart is mildly enlarged. Low lung volumes with left lower lobe atelectasis or infiltrate and layering left effusion. Minimal right base atelectasis. No acute bony abnormality. IMPRESSION: Left lower lobe atelectasis or infiltrate with small left effusion, unchanged. Right base  atelectasis. Electronically Signed   By: Rolm Baptise M.D.   On: 02/21/2020 09:17   DG CHEST PORT 1 VIEW  Result Date: 02/20/2020 CLINICAL DATA:  Central line placement. Status post TAVR. EXAM: PORTABLE CHEST 1 VIEW COMPARISON:  Radiograph yesterday per FINDINGS: Left internal jugular dual lumen central catheter tip is in the lower SVC. No visualized pneumothorax. Right internal jugular central line, endotracheal tube, and enteric tube remain in place. Endotracheal tube tip is 3.2 cm from the carina. Post TAVR with mild cardiomegaly. Aortic atherosclerosis. Hazy retrocardiac opacity likely combination of pleural fluid and atelectasis. Low lung volumes. Streaky right midlung atelectasis. No pulmonary edema. IMPRESSION: 1. Tip of the left internal jugular dual lumen central catheter in the lower SVC. No pneumothorax. 2. Endotracheal tube tip 3.2 cm from the carina. Remaining support apparatus are stable. 3. Low  lung volumes with retrocardiac opacity likely combination of pleural fluid and atelectasis. Electronically Signed   By: Keith Rake M.D.   On: 02/20/2020 15:08   DG Chest Port 1 View  Result Date: 02/19/2020 CLINICAL DATA:  Check endotracheal tube placed EXAM: PORTABLE CHEST 1 VIEW COMPARISON:  02/19/2020 FINDINGS: Endotracheal tube, gastric catheter and right jugular central line are again seen and stable. Changes of prior TAVR are again noted. Aortic calcifications are seen. The lungs are well aerated bilaterally with mild left basilar atelectasis stable from the prior exam. No pneumothorax is seen. IMPRESSION: Tubes and lines as described above stable from the prior exam. Mild left basilar atelectasis stable from the prior exam. Electronically Signed   By: Inez Catalina M.D.   On: 02/19/2020 23:03   DG CHEST PORT 1 VIEW  Result Date: 02/19/2020 CLINICAL DATA:  Endotracheal tube placement. EXAM: PORTABLE CHEST 1 VIEW COMPARISON:  February 09, 2020. FINDINGS: Stable cardiomediastinal silhouette.  Atherosclerosis of thoracic aorta is noted. Endotracheal tube tip is seen at the carina; withdrawal by 3-4 cm is recommended. No pneumothorax is noted. Right lung is clear. Mild left basilar atelectasis or infiltrate is noted. Status post transcatheter aortic valve repair. Bony thorax is unremarkable IMPRESSION: Endotracheal tube tip is seen at the carina; withdrawal by 3-4 cm is recommended. Mild left basilar subsegmental atelectasis or infiltrate is noted. Electronically Signed   By: Marijo Conception M.D.   On: 02/19/2020 14:34   DG Chest Portable 1 View  Result Date: 02/08/2020 CLINICAL DATA:  Wheezing EXAM: PORTABLE CHEST 1 VIEW COMPARISON:  08/29/2019 FINDINGS: Stable mild cardiomegaly status post TAVR. Calcific aortic knob. Bilateral interstitial opacities, right worse than left with areas of subtle nodularity most prominent in the right upper lobe. No pleural effusion or pneumothorax. IMPRESSION: Bilateral interstitial opacities, right worse than left with areas of subtle nodularity most prominent in the right upper lobe. Findings could reflect atypical or viral pneumonia. Radiographic follow-up to resolution is recommended. Electronically Signed   By: Davina Poke D.O.   On: 02/01/2020 14:46   DG Chest Port 1V same Day  Result Date: 02/19/2020 CLINICAL DATA:  Central line placement. EXAM: PORTABLE CHEST 1 VIEW COMPARISON:  February 19, 2020. FINDINGS: Stable cardiomegaly. Endotracheal and nasogastric tubes are unchanged in position. Atherosclerosis of thoracic aorta is noted. No pneumothorax is noted. Right lung is clear. Mild left basilar atelectasis or infiltrate is noted with possible associated pleural effusion. Status post transcatheter aortic valve repair. Bony thorax is unremarkable. Interval placement of right internal jugular catheter with distal tip in expected position of cavoatrial junction. IMPRESSION: Interval placement of right internal jugular catheter with distal tip in expected  position of cavoatrial junction. No pneumothorax is noted. Otherwise stable support apparatus. Mild left basilar atelectasis or infiltrate is noted with possible associated pleural effusion. Electronically Signed   By: Marijo Conception M.D.   On: 02/19/2020 15:19   ECHOCARDIOGRAM COMPLETE  Result Date: 02/10/2020    ECHOCARDIOGRAM REPORT   Patient Name:   [name] [name] Date of Exam: 02/10/2020 Medical Rec #:  [id number]         Height:       57.0 in Accession #:    [id number]        Weight:       150.4 lb Date of Birth:  [birth year]         BSA:          1.593 m Patient Age:  73 years          BP:           128/61 mmHg Patient Gender: F                 HR:           75 bpm. Exam Location:  Inpatient Procedure: 2D Echo and Strain Analysis Indications:    Aortic Stenosis 424.1 / 135.0  History:        Patient has prior history of Echocardiogram examinations, most                 recent 01/11/2019. Stroke, Signs/Symptoms:Shortness of Breath;                 Risk Factors:Hypertension, Diabetes and Dyslipidemia. Pneumonia.                 Chronic kidney disease. Bilateral carotid stenosis.                 Aortic Valve: 23 mm Edwards Sapien prosthetic, stented (TAVR)                 valve is present in the aortic position. Procedure Date:                 01/10/2019.  Sonographer:    Darlina Sicilian RDCS Referring Phys: [id number] [place]  1. Left ventricular ejection fraction, by estimation, is 55 to 60%. The left ventricle has normal function. The left ventricle has no regional wall motion abnormalities. There is moderate concentric left ventricular hypertrophy. Left ventricular diastolic parameters are consistent with Grade I diastolic dysfunction (impaired relaxation). Elevated left ventricular end-diastolic pressure.  2. Right ventricular systolic function is normal. The right ventricular size is normal.  3. The mitral valve is normal in structure and function. Mild mitral annular calcification.  Mild mitral valve regurgitation. No evidence of mitral stenosis.  4. The aortic valve has been repaired/replaced. There is trivial perivalvular AI ipresent. No aortic stenosis is present. There is a 23 mm Edwards Sapien prosthetic (TAVR) valve present in the aortic position. that is funcitoning normaly. Aortic valve mean gradient measures 12.0 mmHg. Aortic valve peak gradient measures 21.8 mmHg. Aortic valve area, by VTI measures 1.66 cm. Dimensionless index is 0.44 and the AVA is calculated to be 1.51cm2.  5. The inferior vena cava is normal in size with greater than 50% respiratory variability, suggesting right atrial pressure of 3 mmHg.  6. Compared to prior echo, the mean TAVR gradient has increased from 11 to 76mmhg and AVG is 1.51cm2 (was 1.88cm2 on last echo). FINDINGS  Left Ventricle: Left ventricular ejection fraction, by estimation, is 55 to 60%. The left ventricle has normal function. The left ventricle has no regional wall motion abnormalities. The left ventricular internal cavity size was normal in size. There is  moderate concentric left ventricular hypertrophy. Left ventricular diastolic parameters are consistent with Grade I diastolic dysfunction (impaired relaxation). Elevated left ventricular end-diastolic pressure. Right Ventricle: The right ventricular size is normal. No increase in right ventricular wall thickness. Right ventricular systolic function is normal. Left Atrium: Left atrial size was normal in size. Right Atrium: Right atrial size was normal in size. Pericardium: Trivial pericardial effusion is present. The pericardial effusion is posterior to the left ventricle. Mitral Valve: The mitral valve is normal in structure and function. Normal mobility of the mitral valve leaflets. Mild mitral annular calcification. Mild mitral valve regurgitation. No evidence of mitral  valve stenosis. Tricuspid Valve: The tricuspid valve is normal in structure. Tricuspid valve regurgitation is not  demonstrated. No evidence of tricuspid stenosis. Aortic Valve: The aortic valve has been repaired/replaced. Aortic valve regurgitation trivial perivalvular AI is present. No aortic stenosis is present. Aortic valve mean gradient measures 12.0 mmHg. Aortic valve peak gradient measures 21.8 mmHg. Aortic valve area, by VTI measures 1.66 cm. There is a 23 mm Edwards Sapien prosthetic, stented (TAVR) valve present in the aortic position. Procedure Date: 01/10/2019. Pulmonic Valve: The pulmonic valve was normal in structure. Pulmonic valve regurgitation is not visualized. No evidence of pulmonic stenosis. Aorta: The aortic root is normal in size and structure. Venous: The inferior vena cava is normal in size with greater than 50% respiratory variability, suggesting right atrial pressure of 3 mmHg. IAS/Shunts: No atrial level shunt detected by color flow Doppler.  LEFT VENTRICLE PLAX 2D LVIDd:         4.79 cm      Diastology LVIDs:         3.43 cm      LV e' lateral:   4.35 cm/s LV PW:         1.22 cm      LV E/e' lateral: 29.7 LV IVS:        1.34 cm      LV e' medial:    3.00 cm/s LVOT diam:     2.20 cm      LV E/e' medial:  43.0 LV SV:         82 LV SV Index:   52 LVOT Area:     3.80 cm  LV Volumes (MOD) LV vol d, MOD A2C: 99.5 ml LV vol d, MOD A4C: 125.0 ml LV vol s, MOD A2C: 50.2 ml LV vol s, MOD A4C: 56.4 ml LV SV MOD A2C:     49.3 ml LV SV MOD A4C:     125.0 ml LV SV MOD BP:      58.3 ml RIGHT VENTRICLE RV S prime:     11.20 cm/s TAPSE (M-mode): 1.6 cm LEFT ATRIUM             Index       RIGHT ATRIUM           Index LA diam:        3.60 cm 2.26 cm/m  RA Area:     15.20 cm LA Vol (A2C):   55.7 ml 34.96 ml/m RA Volume:   34.70 ml  21.78 ml/m LA Vol (A4C):   49.6 ml 31.13 ml/m LA Biplane Vol: 53.2 ml 33.39 ml/m  AORTIC VALVE AV Area (Vmax):    1.51 cm AV Area (Vmean):   1.61 cm AV Area (VTI):     1.66 cm AV Vmax:           233.50 cm/s AV Vmean:          162.000 cm/s AV VTI:            0.496 m AV Peak Grad:       21.8 mmHg AV Mean Grad:      12.0 mmHg LVOT Vmax:         92.50 cm/s LVOT Vmean:        68.400 cm/s LVOT VTI:          0.217 m LVOT/AV VTI ratio: 0.44  AORTA Ao Root diam: 3.10 cm Ao Asc diam:  3.00 cm MITRAL VALVE MV Area (PHT): 3.21 cm  SHUNTS MV Decel Time: 236 msec     Systemic VTI:  0.22 m MV E velocity: 129.00 cm/s  Systemic Diam: 2.20 cm MV A velocity: 144.00 cm/s MV E/A ratio:  0.90 Fransico Him MD Electronically signed by Fransico Him MD Signature Date/Time: 02/10/2020/5:45:00 PM    Final     Microbiology No results found for this or any previous visit (from the past 240 hour(s)).  Lab Basic Metabolic Panel: Recent Labs  Lab 2020-03-24 1621  NA 136  K 5.2*  CL 95*  CO2 15*  GLUCOSE 122*  BUN 18  CREATININE 1.82*  CALCIUM 6.5*  PHOS 5.8*   Liver Function Tests: Recent Labs  Lab 2020/03/24 1621  ALBUMIN 2.1*   No results for input(s): LIPASE, AMYLASE in the last 168 hours. No results for input(s): AMMONIA in the last 168 hours. CBC: Recent Labs  Lab 03-24-20 1340  WBC 22.1*  HGB 7.7*  HCT 24.6*  MCV 96.5  PLT 33*   Cardiac Enzymes: No results for input(s): CKTOTAL, CKMB, CKMBINDEX, TROPONINI in the last 168 hours. Sepsis Labs: Recent Labs  Lab 24-Mar-2020 1340  WBC 22.1*    Procedures/Operations   ETT 3/8 >>  R IJ TLC 3/8 >>  L IJ HD 3/9 >>   Asael Pann L Juanmanuel Marohl 03/01/2020, 11:34 AM

## 2020-03-14 NOTE — Progress Notes (Signed)
PCCM progress note   Long discussion held with patients sister Joanne Chars 3/12. She again reiterates that Mrs. Sees would not want any aggressive measures including prolong feeding tube or trach. The family is hopefully for recovery but Joanne Chars stated that if by 14 days post intubation there is no recovery then the family would want to focus on comfort. I informed Joanne Chars that Mrs. Slawinski may no survive that long given the significant trauma her body has already experienced with little recovery seen thus far. The family would like to continue current treatment but has request no additional life prolonging measures. If Mrs. Villarruel were to further decline the family would like to be informed so they may be present and the transition to comfort care would occur at that time.   Johnsie Cancel, NP-C [facility] Contact / Pager information can be found on Amion  Mar 24, 2020, 11:28 AM

## 2020-03-14 NOTE — Progress Notes (Signed)
Inpatient Diabetes Program Recommendations  AACE/ADA: New Consensus Statement on Inpatient Glycemic Control (2015)  Target Ranges:  Prepandial:   less than 140 mg/dL      Peak postprandial:   less than 180 mg/dL (1-2 hours)      Critically ill patients:  140 - 180 mg/dL   Lab Results  Component Value Date   GLUCAP 121 (H) 03/18/20   HGBA1C 6.6 (H) 02/10/2020    Review of Glycemic Control  Current orders for Inpatient glycemic control:  Novolog 0-9 units Q4H; Vital @ 50 ml/hr  Inpatient Diabetes Program Recommendations:  Correction (SSI): Novolog 0-6 Q4H  Thank you, Reche Dixon, RN, BSN Diabetes Coordinator Inpatient Diabetes Program [phone] (team pager from 8a-5p)

## 2020-03-14 DEATH — deceased

## 2021-01-12 IMAGING — CT CT HEAD W/O CM
3 of 6 series · 16 of 47 positions shown, 19 images · non-contrast
Comparison: 01/13/2019

CLINICAL DATA: Fall

EXAM:
CT HEAD WITHOUT CONTRAST
TECHNIQUE: Contiguous axial images were obtained from the base of the skull
through the vertex without intravenous contrast.

[Series 2: head w o · axial · 0.41mm/px · z∈[+1265,+1405]mm · 11 of 32 slices shown, 14 images]
[im 2/32  brain]
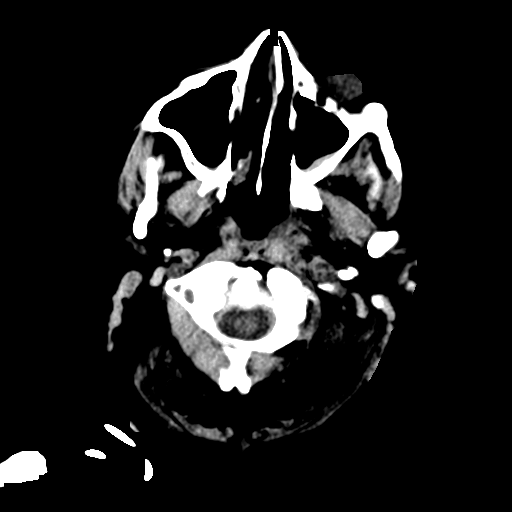
[im 2/32  bone]
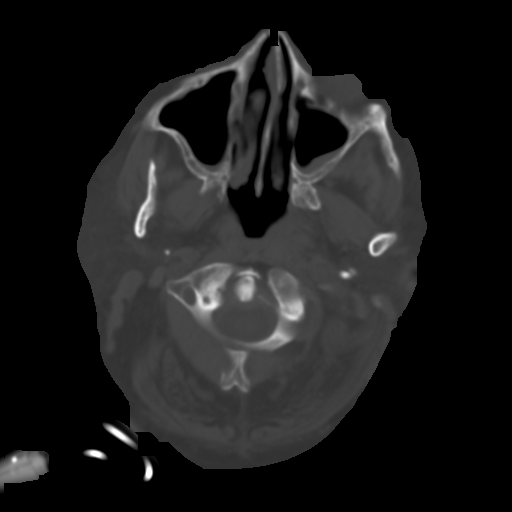
[im 5/32  brain]
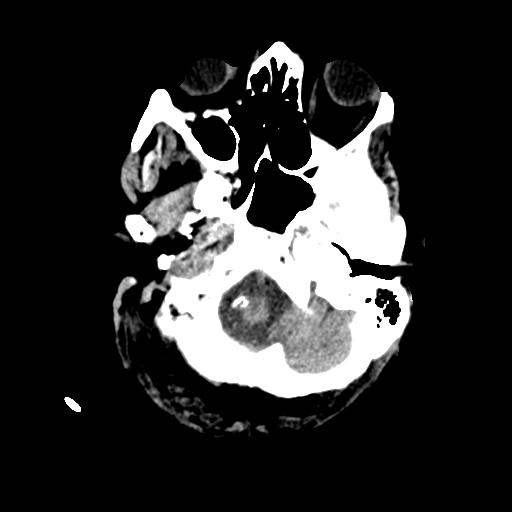
[im 8/32  brain]
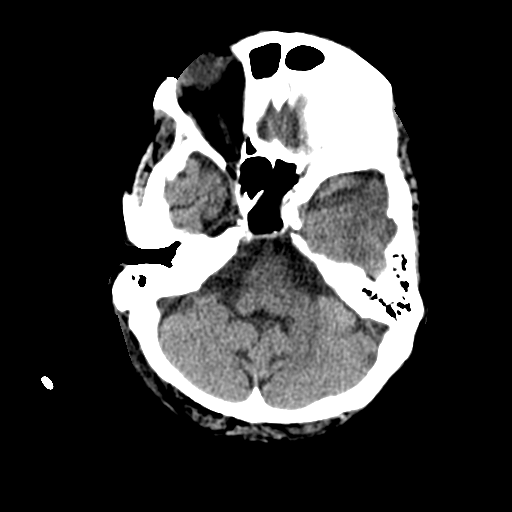
[im 10/32  brain]
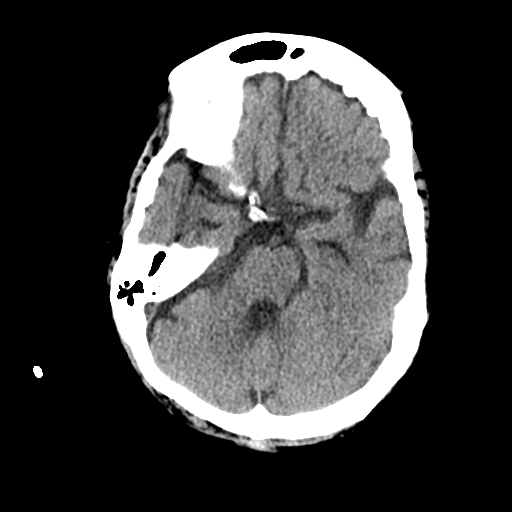
[im 13/32  brain]
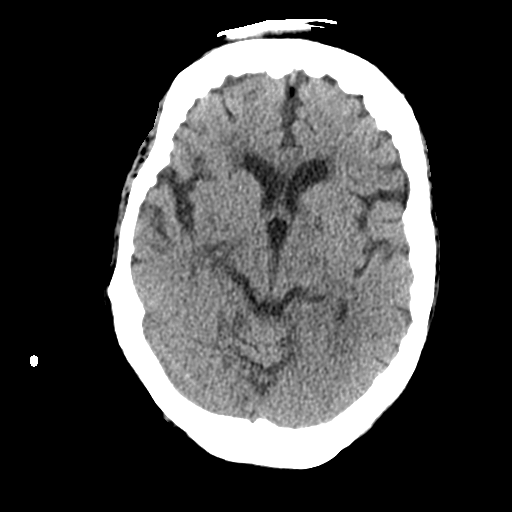
[im 13/32  bone]
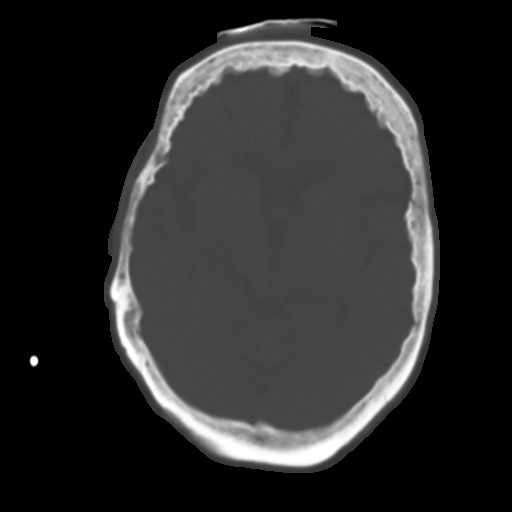
[im 16/32  brain]
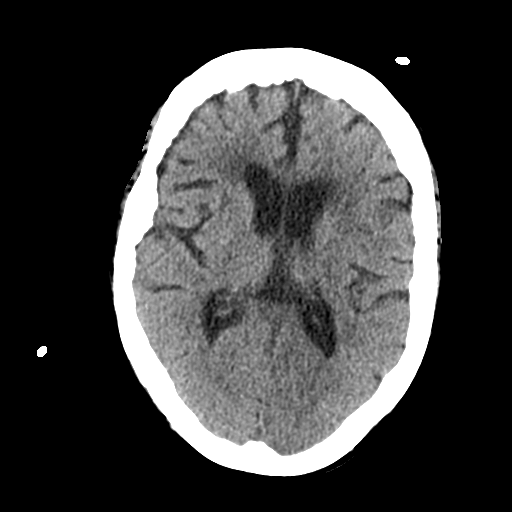
[im 19/32  brain]
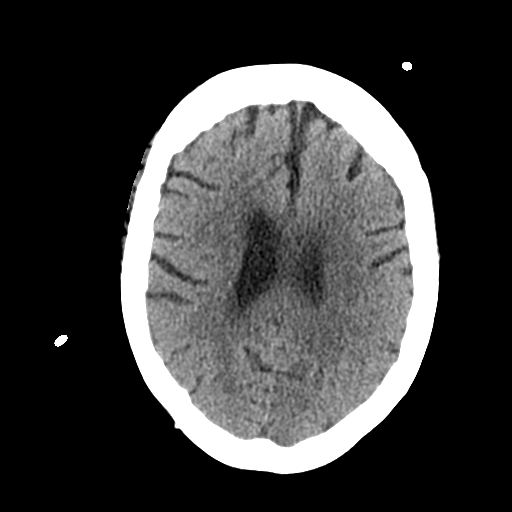
[im 22/32  brain]
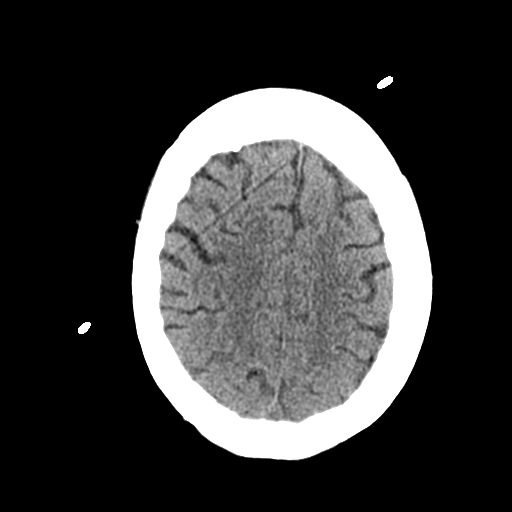
[im 24/32  brain]
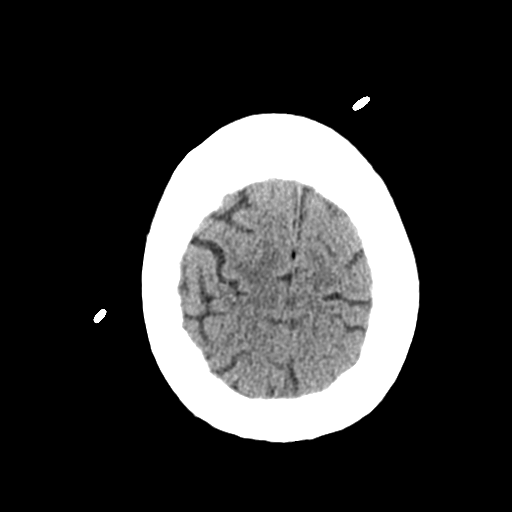
[im 24/32  bone]
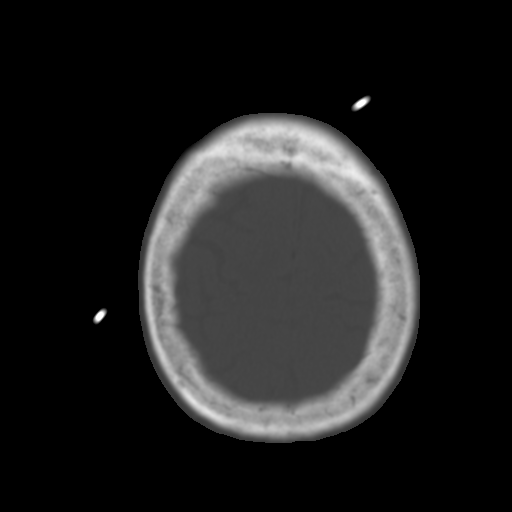
[im 27/32  brain]
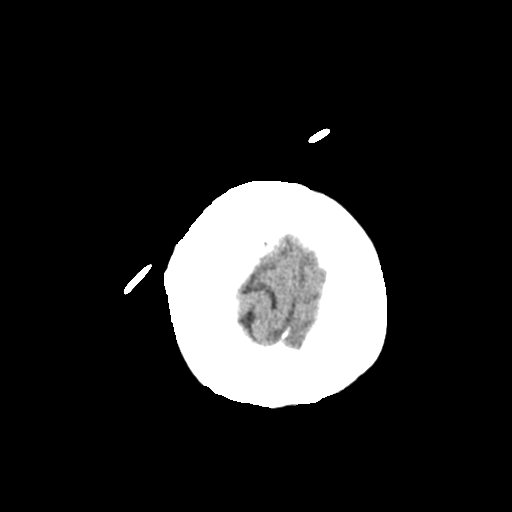
[im 30/32  brain]
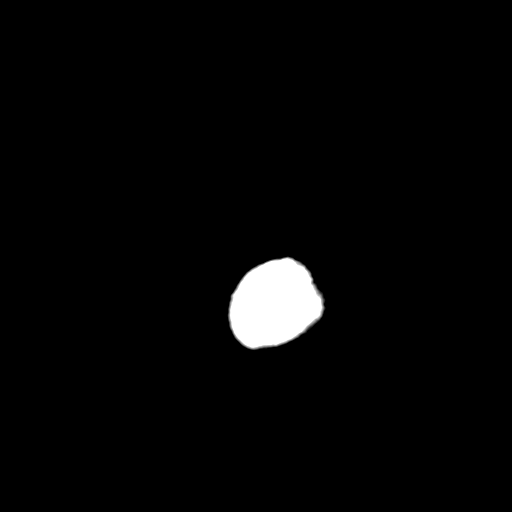

[Series 4: coronal soft · coronal · 0.34mm/px · 3 of 69 slices shown]
[im 18/69  brain]
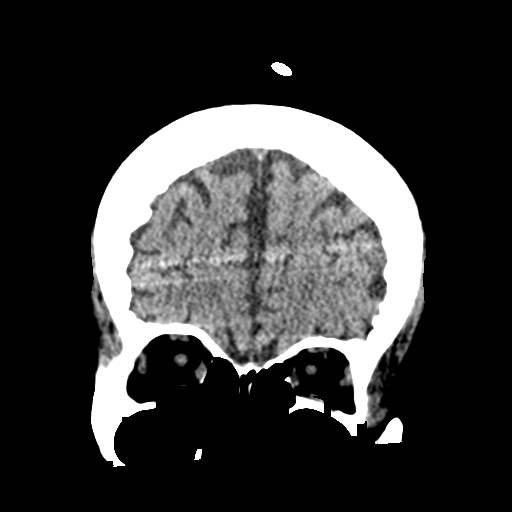
[im 35/69  brain]
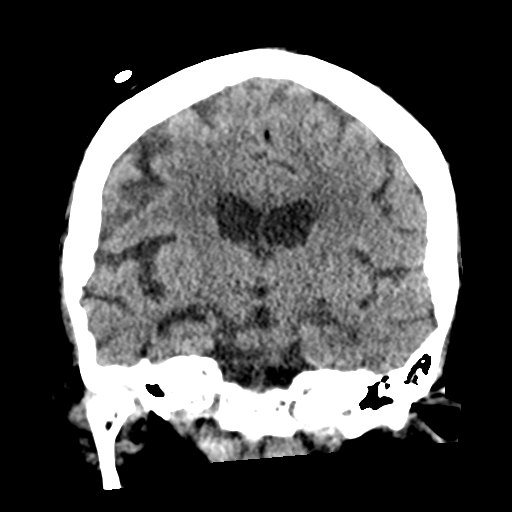
[im 52/69  brain]
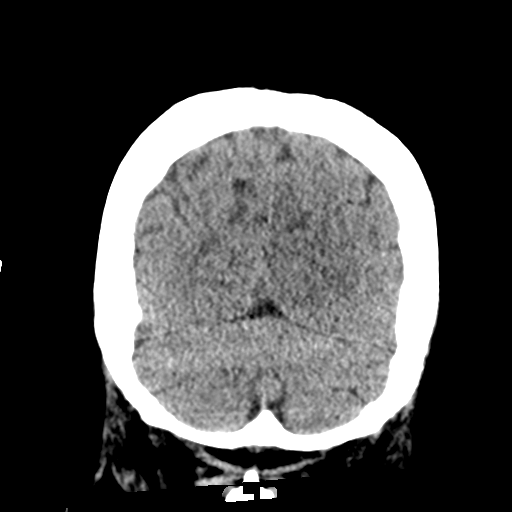

[Series 5: sagittal soft · sagittal · 0.35mm/px · 2 of 54 slices shown]
[im 18/54  brain]
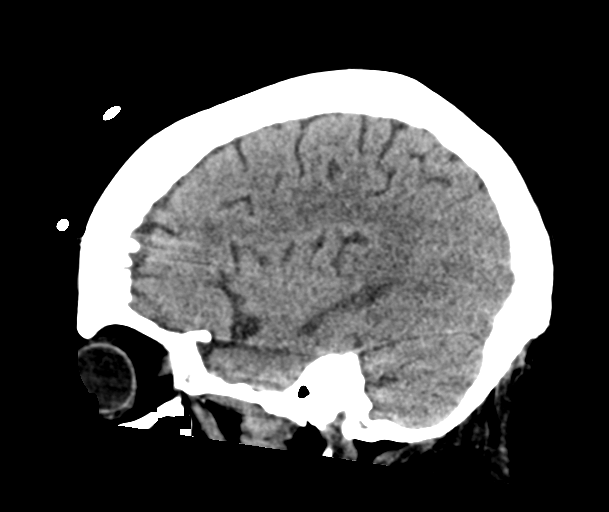
[im 36/54  brain]
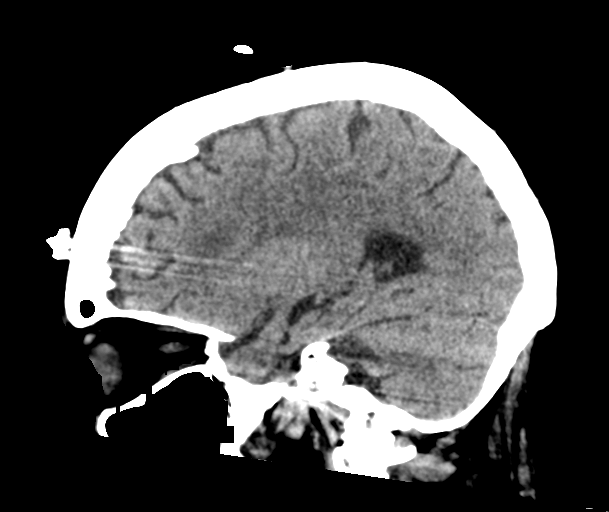

[16 of 47 positions shown; findings below may reference images not displayed]

FINDINGS: Brain: There is no acute intracranial hemorrhage, mass-effect, or
edema. Gray-white differentiation is preserved. There is no
extra-axial fluid collection.

Prominence of the ventricles and sulci reflects stable minor
parenchymal volume loss. Patchy hypoattenuation in the
supratentorial white matter is nonspecific but probably reflects
stable mild to moderate chronic microvascular ischemic changes.

Vascular: There is atherosclerotic calcification at the skull base.

Skull: Calvarium is unremarkable.

Sinuses/Orbits: No acute finding.

Other: None.
IMPRESSION: No evidence of acute intracranial injury. Stable chronic findings
detailed above.

## 2021-01-14 IMAGING — DX DG CHEST 1V PORT
1 series · 1 of 1 positions shown · non-contrast
Comparison: 02/20/2020

CLINICAL DATA: ET tube, respiratory failure

EXAM:
PORTABLE CHEST 1 VIEW

[chest ap]
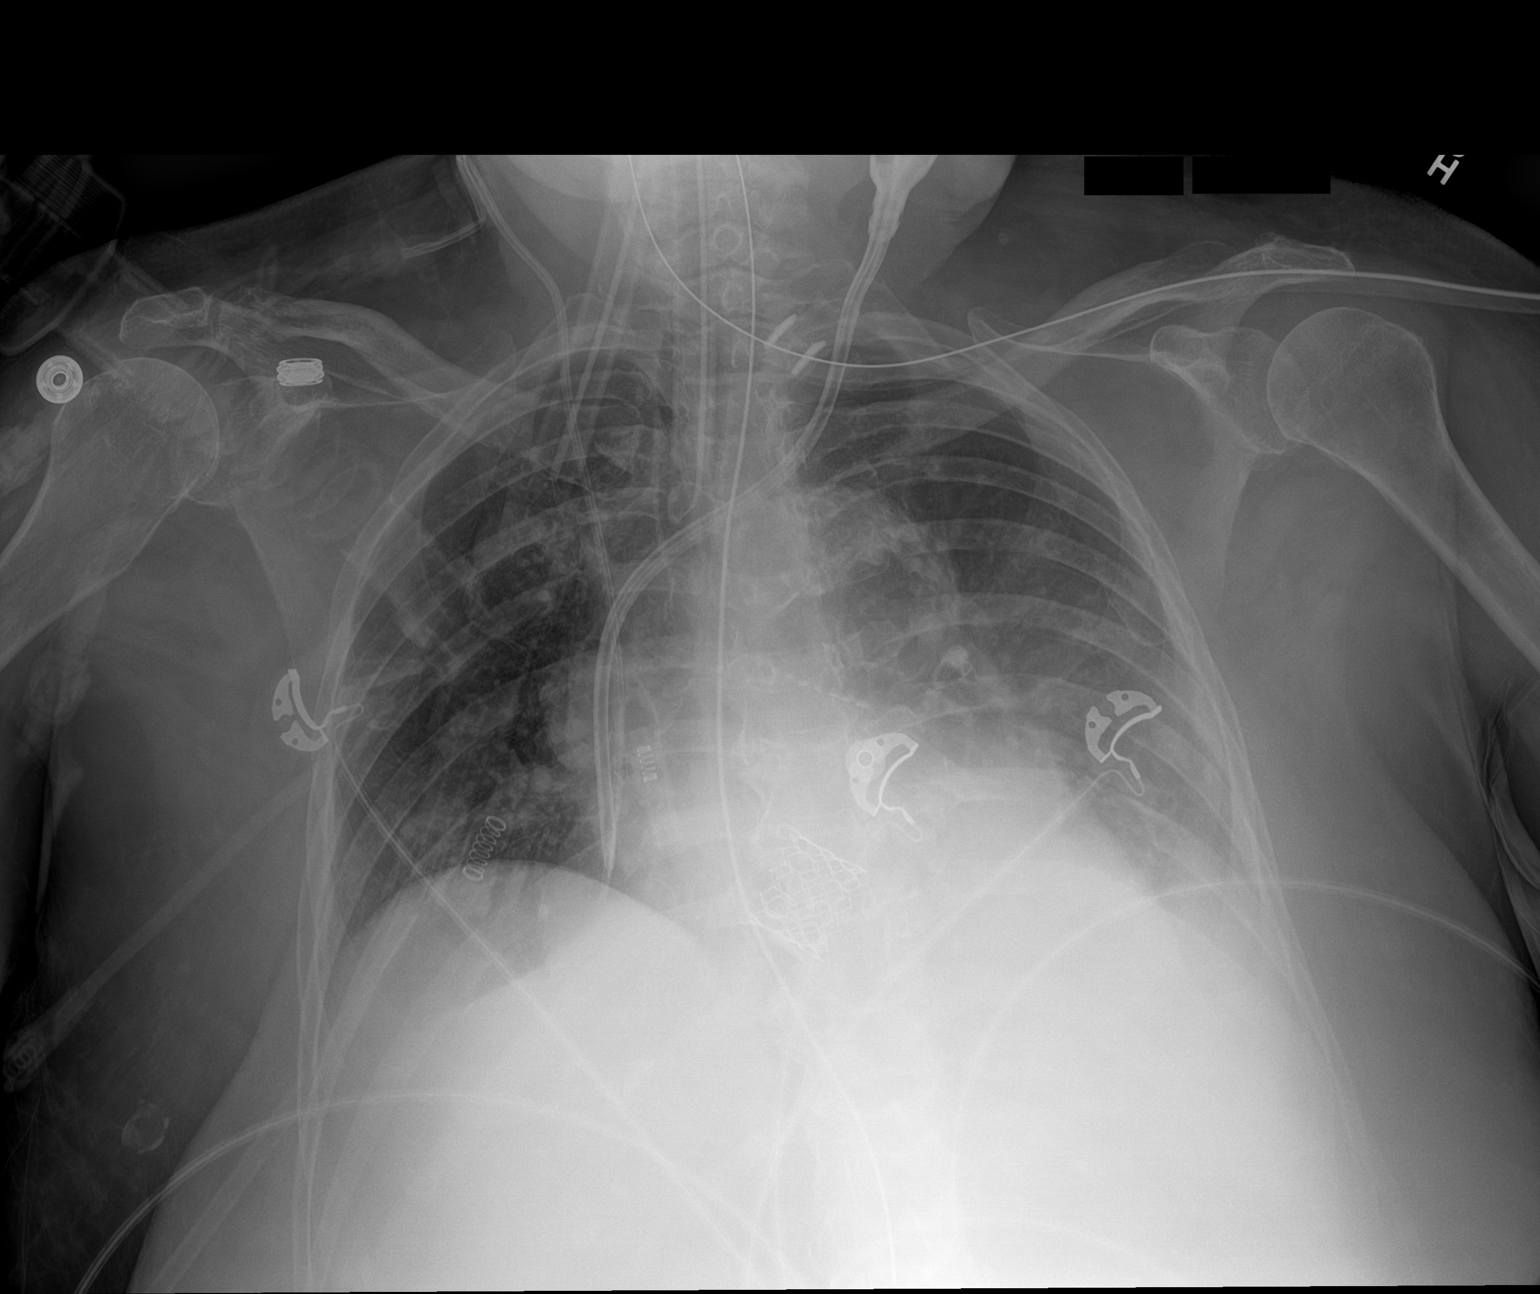

[1 of 1 positions shown; findings below may reference images not displayed]

FINDINGS: Support devices are stable. Prior TAVR. Heart is mildly enlarged.
Low lung volumes with left lower lobe atelectasis or infiltrate and
layering left effusion. Minimal right base atelectasis. No acute
bony abnormality.
IMPRESSION: Left lower lobe atelectasis or infiltrate with small left effusion,
unchanged.

Right base atelectasis.
